# Patient Record
Sex: Male | Born: 1990 | Race: Black or African American | Hispanic: No
Health system: Southern US, Community
[De-identification: ages and names within clinical notes are randomized; demographics above are authoritative.]

## PROBLEM LIST (undated history)

## (undated) ENCOUNTER — Emergency Department (HOSPITAL_COMMUNITY): Admission: EM | Discharge status: Against Medical Advice | Payer: Medicaid Other

## (undated) DIAGNOSIS — D509 Iron deficiency anemia, unspecified: Secondary | ICD-10-CM

## (undated) DIAGNOSIS — K5090 Crohn's disease, unspecified, without complications: Secondary | ICD-10-CM

## (undated) DIAGNOSIS — J628 Pneumoconiosis due to other dust containing silica: Secondary | ICD-10-CM

## (undated) DIAGNOSIS — K922 Gastrointestinal hemorrhage, unspecified: Secondary | ICD-10-CM

## (undated) DIAGNOSIS — E43 Unspecified severe protein-calorie malnutrition: Secondary | ICD-10-CM

## (undated) DIAGNOSIS — K56609 Unspecified intestinal obstruction, unspecified as to partial versus complete obstruction: Secondary | ICD-10-CM

## (undated) DIAGNOSIS — K219 Gastro-esophageal reflux disease without esophagitis: Secondary | ICD-10-CM

## (undated) HISTORY — DX: Iron deficiency anemia, unspecified: D50.9

## (undated) HISTORY — DX: Gastro-esophageal reflux disease without esophagitis: K21.9

---
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
## 2010-02-26 ENCOUNTER — Emergency Department (HOSPITAL_COMMUNITY): Admission: EM | Admit: 2010-02-26 | Discharge: 2010-02-27 | Payer: Self-pay | Admitting: Emergency Medicine

## 2010-03-25 ENCOUNTER — Telehealth: Payer: Self-pay | Admitting: Gastroenterology

## 2010-05-06 ENCOUNTER — Ambulatory Visit: Admit: 2010-05-06 | Payer: Self-pay | Admitting: Gastroenterology

## 2010-05-28 NOTE — Progress Notes (Signed)
Summary: Records request from [facility]  Request for records received from [facility]. Request forwarded to [facility]. Wilder Glade  May 30, 2009 4:49 PM

## 2010-05-28 NOTE — Assessment & Plan Note (Signed)
Summary: 6 week follow up crohns/lk   History of Present Illness Visit Type: Follow-up Visit Primary GI MD: Elie Goody MD [facility] Primary Provider: NA Requesting Provider: NA Chief Complaint: Shane Klein here for follow up of his crohns. States that he was in the hospital for a small bowel obstruction on New Years. They started him on the prednisone dose he is on now and stopped his entocort.  History of Present Illness:   This is a return visit following hospitalization for a Crohn's flare with partial obstructive symptoms. He was placed on a prednisone taper and has had an excellent response. He has no abdominal complaints at this time and states he is gaining weight and feels well other than complaining of acne.   GI Review of Systems      Denies abdominal pain, acid reflux, belching, bloating, chest pain, dysphagia with liquids, dysphagia with solids, heartburn, loss of appetite, nausea, vomiting, vomiting blood, weight loss, and  weight gain.        Denies anal fissure, black tarry stools, change in bowel habit, constipation, diarrhea, diverticulosis, fecal incontinence, heme positive stool, hemorrhoids, irritable bowel syndrome, jaundice, light color stool, liver problems, rectal bleeding, and  rectal pain.   Current Medications (verified): 1)  Robinul-Forte 2 Mg Tabs (Glycopyrrolate) .... One Tablet By Mouth Two Times A Day 2)  Omeprazole 20 Mg Cpdr (Omeprazole) .... One Tablet By Mouth Once Daily 3)  Prednisone 10 Mg  Tabs (Prednisone) .... Take Two Tablets By Mouth in The Morning and One Tablet By Mouth in The Evening. 4)  Multivitamins  Tabs (Multiple Vitamin) .... Take One By Mouth Once Daily  Allergies (verified): No Known Drug Allergies  Past History:  Past Medical History: Crohn's ileoscoliitis Duodenitis Fe deficiency anemia GERD Tinea pedis Eczema Small bowel obstruction related to Crohn's  Past Surgical History: Reviewed history from 01/27/2009 and no changes  required. Unremarkable  Family History: Reviewed history from 01/27/2009 and no changes required. Family History of Diabetes: maternal aunt, maternal grandmother, maternal cousin No FH of Colon Cancer:  Social History: Single Alcohol Use - yes once a month Illicit Drug Use - yes THC 3-4 times  a week  Patient is a former smoker. -stopped April 2010 Patient gets regular exercise. Daily Caffeine Use varies  Review of Systems       The pertinent positives and negatives are noted as above and in the HPI. All other ROS were reviewed and were negative.   Vital Signs:  Patient profile:   20 year old male Height:      66 inches Weight:      174.8 pounds BMI:     28.32 Pulse rate:   64 / minute Pulse rhythm:   regular BP sitting:   114 / 60  (right arm) Cuff size:   regular  Vitals Entered By: Harlow Mares CMA [facility] Dull) (May 16, 2009 4:06 PM)  Physical Exam  General:  Well developed, well nourished, no acute distress. Head:  Normocephalic and atraumatic. Eyes:  PERRLA, no icterus. Mouth:  No deformity or lesions, dentition normal. Lungs:  Clear throughout to auscultation. Heart:  Regular rate and rhythm; no murmurs, rubs,  or bruits. Abdomen:  Soft, nontender and nondistended. No masses, hepatosplenomegaly or hernias noted. Normal bowel sounds. Psych:  Alert and cooperative. Normal mood and affect.  Impression & Recommendations:  Problem # 1:  CROHN'S DISEASE-LARGE & SMALL INTESTINE (ICD-555.2) His Crohn's disease has responded well to prednisone. Continue prednisone taper as outlined in patient  instructions. Resume Entocort when he reaches 10 mg of prednisone a day. Change Robinul 2 b.i.d. p.r.n.  Problem # 2:  ANEMIA-IRON DEFICIENCY (ICD-280.9) Begin iron replacement with meals.  Patient Instructions: 1)  Please take your prednisone as follows: Take 30 mg (2 tablets in the morning, 1 tablet in the evening) x 1 week. Then, take 20 mg (2 tablets in the morning) x 1  week. Then, take 10 mg (1 tablet every morning) x 1 week. Then, take 10 mg (1 tablet every morning) every OTHER day x 1 week. 2)  When you get down to 10 mg on your prednisone taper, please begin taking Entocort 3 tablets every morning. 3)  We have sent a new rx for Entocort to your pharmacy. 4)  Follow up as needed for your gastroentestinal concerns. 5)  The medication list was reviewed and reconciled.  All changed / newly prescribed medications were explained.  A complete medication list was provided to the patient / caregiver. 6)  Please schedule a follow-up appointment in 6 weeks.   Prescriptions: ENTOCORT EC 3 MG XR24H-CAP (BUDESONIDE) Take 3 tablets by mouth every morning.  #90 x 1   Entered by:   Hortense Ramal CMA (AAMA)   Authorized by:   Meryl Dare MD [facility]   Signed by:   Hortense Ramal CMA Duncan Dull) on 05/16/2009   Method used:   Electronically to        [facility] [phone]* (retail)       [address]       [place], [place]  [address]       Ph: [phone]       Fax: [phone]   RxID:   [id number]

## 2010-05-28 NOTE — Discharge Summary (Signed)
Summary: Discharge Summary -- Small Bowel Obstruction  NAME:  Shane Klein, Shane Klein              ACCOUNT NO.:  [id number]      MEDICAL RECORD NO.:  [id number]          PATIENT TYPE:  INP      LOCATION:  [place]                         FACILITY:  [facility]      PHYSICIAN:  Milus Banister, MD   DATE OF BIRTH:  1991/04/03      DATE OF ADMISSION:  04/24/2009   DATE OF DISCHARGE:  04/26/2009                                  DISCHARGE SUMMARY      ADMITTING DIAGNOSES:   1. Small bowel obstruction secondary to Crohn disease.   2. History of Crohn's ileocolitis involving the ileum and cecum       diagnosed in October 2010.   3. History of duodenitis.  Biopsy was inconclusive as to whether this       was Crohn's but could not rule out Crohn's related duodenitis.      DISCHARGE DIAGNOSES:   1. Small bowel obstruction secondary to Crohn disease.   2. Microcytic anemia.   3. Mild hyperglycemia associated with steroid use.      LABORATORY DATA:  Hemoglobin 11.1, hematocrit 34.5, MCV 70.6, platelet   count 508 and white blood cell count 10.3.  Erythrocyte sedimentation   rate 23.  C-reactive protein 3.8.  Sodium 136, potassium initially 5.3,   corrected to 4.5.  Chloride 102, CO2 28.  Glucose 105 and 103, BUN 11,   creatinine 0.7.  Calcium 9.3.      X-RAY STUDIES:  Acute abdominal series of December 30 showed small-bowel   obstruction consisting of dilated loops of proximal small bowel with air-   fluid levels.  There was a paucity of gas in the colon and rectosigmoid.      PROCEDURES:  None.      CONSULTATIONS:  None.      BRIEF HISTORY:  Shane Klein is a pleasant 20 year old African   American young man who was diagnosed with Crohn's colitis and terminal   ileitis in October 2010.  He had a colonoscopy at which time the Crohn's   involving the cecum was seen but Dr. Fuller Plan was unable to intubate the   terminal ileum.  On a CT scan obtained on December 14. there was   evidence for active Crohn's  involving distal terminal ileum of about 15   cm length.  On that CT scan there was question of a developing stricture   because there was some dilation in the more proximal small bowel.  Also   reading of that CT scan said cannot exclude small abscess within right   lower quadrant.  However, has not had fevers or malaise suggestive of   any active infection.      Medications recently had consisted of Entocort 9 mg daily.  Based on the   CT scan of the 14th Dr. Fuller Plan initiated prednisone taper because the   patient was having increased left lower quadrant pain.  This was a   tapering dose and when he was seen on the December 30, the dose was at  20 mg daily.  He had been feeling better.  However, on December 29 he   developed recurrent lower abdominal pain along with vomiting on the   morning of December 30.  Bowel movements had been fairly normal though   in the last 18 hours.  He had not had significant flatus.      The patient had an acute abdominal series which showed a small bowel   obstruction and he was admitted to the [facility] service with Dr.   Owens Loffler as the attending GI physician for this admission.  The   patient did not have any fever.  Fortunately this admission his current   a time when he is not attending school.      HOSPITAL COURSE:  The patient was admitted to the hospital and started   on Solu-Medrol 30 mg twice daily, IV Protonix and morphine and Zofran   for symptomatic relief.  He was supported with IV fluids.  He was   allowed ice chips only but diet was advanced through clear liquids and   ultimately to low-residue diet.      The patient had his first low-residue meal on December 31 in the   evening.  He tolerated this along with breakfast that morning.  Symptoms   were clearly improving and it seemed unnecessary to get a x-ray to   support what we knew was resolution of the acute small bowel   obstruction.  Note that his glucose was slightly elevated  with the   steroids in place.      The patient is to go home on a tapering dose of prednisone starting at   40 mg dosage daily.  He is to stop budesonide.      MEDICATIONS AT DISCHARGE:   1. Prednisone 20 mg twice daily for 10 days and then he decreases the       dose to 20 mg in the morning and 10 mg in the evening.  He is to       stay on that dose until he is seen by Dr. Fuller Plan.   2. Omeprazole 20 mg daily.   3. Robinul Forte (glycopyrrolate) 2 mg by mouth twice daily.   4. Multivitamin with iron once daily.      FOLLOW-UP:  Office visit with Dr. Fuller Plan is scheduled for January 21.   However we are going to work with a schedule and see if we can get him   squeezed in at a sooner date than that.  Otherwise he is to keep that   appointment.      CONDITION ON DISCHARGE:  Improved.      DISCHARGE DIET:  Should be a low fiber diet of low fiber/low residue   diet.  He is to receive a copy of a dietary outlined to help him with   choosing low residue foods.      ACTIVITY:  He has no restrictions.  He is okay to return to school on   January 5.               Azucena Freed, PA-C               Milus Banister, MD   Electronically Signed         SG/MEDQ  D:  04/26/2009  T:  04/26/2009  Job:  [id number]      cc:   Maudry Mayhew NP [facility]

## 2010-05-28 NOTE — Progress Notes (Signed)
Summary: Medication  Phone Note Call from Patient   Caller: Patient Call For: Dr. Russella Dar Reason for Call: Refill Medication Summary of Call: Needs his Robinul forte and Omeprazole refilled..sch'd appt on 05-06-09 Initial call taken by: Karna Christmas,  March 25, 2010 11:23 AM  Follow-up for Phone Call        Told pt that he was supposed to have a follow up appt with Dr. Russella Dar 6 weeks after his last office visit in January. Pt states he knows and he could not come to his appt. Pt did say he would come to the appointment in January and he is doing much better with Crohn's disease right now. Pt would just like a refill until his appt. Told pt that we would give him one refill until appt in January but keep appt for any further refills. Pt agreed.  Follow-up by: Christie Nottingham CMA Duncan Dull),  March 25, 2010 11:36 AM    Prescriptions: ROBINUL-FORTE 2 MG TABS (GLYCOPYRROLATE) one tablet by mouth two times a day  #60 x 0   Entered by:   Christie Nottingham CMA (AAMA)   Authorized by:   Meryl Dare MD [facility]   Signed by:   Christie Nottingham CMA Duncan Dull) on 03/25/2010   Method used:   Electronically to        [facility] [phone]* (retail)       [address]       [place], [place]  [address]       Ph: [phone]       Fax: [phone]   RxID:   [id number] OMEPRAZOLE 20 MG CPDR (OMEPRAZOLE) one tablet by mouth once daily  #30 x 0   Entered by:   Christie Nottingham CMA (AAMA)   Authorized by:   Meryl Dare MD [facility]   Signed by:   Christie Nottingham CMA Duncan Dull) on 03/25/2010   Method used:   Electronically to        [facility] [phone]* (retail)       [address]       [place], [place]  [address]       Ph: [phone]       Fax: [phone]   RxID:   [id number]

## 2010-07-07 LAB — COMPREHENSIVE METABOLIC PANEL
ALT: 11 U/L (ref 0–53)
AST: 17 U/L (ref 0–37)
Alkaline Phosphatase: 71 U/L (ref 39–117)
Calcium: 9.3 mg/dL (ref 8.4–10.5)
GFR calc Af Amer: >60 mL/min (ref >60)
Glucose, Bld: 86 mg/dL (ref 70–99)
Potassium: 3.8 mEq/L (ref 3.5–5.1)
Sodium: 137 mEq/L (ref 135–145)
Total Protein: 7.8 g/dL (ref 6.0–8.3)

## 2010-07-07 LAB — DIFFERENTIAL
Basophils Relative: 1 % (ref 0–1)
Eosinophils Absolute: 0.2 10*3/uL (ref 0.0–0.7)
Eosinophils Relative: 3 % (ref 0–5)
Lymphs Abs: 1 10*3/uL (ref 0.7–4.0)
Monocytes Absolute: 0.8 10*3/uL (ref 0.1–1.0)
Monocytes Relative: 11 % (ref 3–12)

## 2010-07-07 LAB — URINALYSIS, ROUTINE W REFLEX MICROSCOPIC
Bilirubin Urine: NEGATIVE
Glucose, UA: NEGATIVE mg/dL
Hgb urine dipstick: NEGATIVE
Ketones, ur: 15 mg/dL — AB
Protein, ur: NEGATIVE mg/dL
pH: 5 (ref 5.0–8.0)

## 2010-07-07 LAB — CBC
HCT: 35.6 % — ABNORMAL LOW (ref 39.0–52.0)
Hemoglobin: 11.6 g/dL — ABNORMAL LOW (ref 13.0–17.0)
MCHC: 32.6 g/dL (ref 30.0–36.0)
RDW: 19.1 % — ABNORMAL HIGH (ref 11.5–15.5)
WBC: 6.9 10*3/uL (ref 4.0–10.5)

## 2010-07-12 LAB — BASIC METABOLIC PANEL
BUN: 15 mg/dL (ref 6–23)
CO2: 28 mEq/L (ref 19–32)
Chloride: 97 mEq/L (ref 96–112)
Potassium: 4.5 mEq/L (ref 3.5–5.1)

## 2010-07-19 LAB — POCT HEMOGLOBIN-HEMACUE: Hemoglobin: 9.8 g/dL — ABNORMAL LOW (ref 13.0–17.0)

## 2010-07-27 LAB — CBC
HCT: 33.6 % — ABNORMAL LOW (ref 39.0–52.0)
HCT: 34.5 % — ABNORMAL LOW (ref 39.0–52.0)
Hemoglobin: 11.1 g/dL — ABNORMAL LOW (ref 13.0–17.0)
MCV: 70.6 fL — ABNORMAL LOW (ref 78.0–100.0)
MCV: 72.6 fL — ABNORMAL LOW (ref 78.0–100.0)
Platelets: 484 10*3/uL — ABNORMAL HIGH (ref 150–400)
RBC: 4.63 MIL/uL (ref 4.22–5.81)
RDW: 21.3 % — ABNORMAL HIGH (ref 11.5–15.5)
WBC: 8.4 10*3/uL (ref 4.0–10.5)

## 2010-07-27 LAB — POCT I-STAT, CHEM 8
Chloride: 102 mEq/L (ref 96–112)
Creatinine, Ser: 0.8 mg/dL (ref 0.4–1.5)
Glucose, Bld: 90 mg/dL (ref 70–99)
Potassium: 3.9 mEq/L (ref 3.5–5.1)

## 2010-07-27 LAB — BASIC METABOLIC PANEL
CO2: 28 mEq/L (ref 19–32)
Calcium: 9.3 mg/dL (ref 8.4–10.5)
Chloride: 102 mEq/L (ref 96–112)
GFR calc Af Amer: >60 mL/min (ref >60)
Potassium: 5.3 mEq/L — ABNORMAL HIGH (ref 3.5–5.1)
Sodium: 136 mEq/L (ref 135–145)

## 2010-07-27 LAB — DIFFERENTIAL
Basophils Absolute: 0 10*3/uL (ref 0.0–0.1)
Eosinophils Relative: 0 % (ref 0–5)
Lymphs Abs: 0.5 10*3/uL — ABNORMAL LOW (ref 0.7–4.0)
Monocytes Absolute: 1.1 10*3/uL — ABNORMAL HIGH (ref 0.1–1.0)
Monocytes Relative: 11 % (ref 3–12)
Neutro Abs: 8.7 10*3/uL — ABNORMAL HIGH (ref 1.7–7.7)

## 2010-07-27 LAB — URINALYSIS, ROUTINE W REFLEX MICROSCOPIC
Bilirubin Urine: NEGATIVE
Hgb urine dipstick: NEGATIVE
Specific Gravity, Urine: 1.01 (ref 1.005–1.030)
pH: 7 (ref 5.0–8.0)

## 2010-08-10 LAB — CBC
Hemoglobin: 10.9 g/dL — ABNORMAL LOW (ref 12.0–16.0)
MCV: 70.3 fL — ABNORMAL LOW (ref 78.0–98.0)
RBC: 4.82 MIL/uL (ref 3.80–5.70)
WBC: 6.5 10*3/uL (ref 4.5–13.5)

## 2010-08-10 LAB — RHEUMATOID FACTOR: Rhuematoid fact SerPl-aCnc: <20 IU/mL (ref 0–20)

## 2010-08-10 LAB — COMPREHENSIVE METABOLIC PANEL
ALT: 10 U/L (ref 0–53)
AST: 13 U/L (ref 0–37)
Alkaline Phosphatase: 56 U/L (ref 52–171)
CO2: 27 mEq/L (ref 19–32)
Chloride: 108 mEq/L (ref 96–112)
Glucose, Bld: 119 mg/dL — ABNORMAL HIGH (ref 70–99)
Sodium: 142 mEq/L (ref 135–145)
Total Bilirubin: 0.4 mg/dL (ref 0.3–1.2)

## 2010-08-10 LAB — DIFFERENTIAL
Basophils Absolute: 0 10*3/uL (ref 0.0–0.1)
Basophils Relative: 1 % (ref 0–1)
Eosinophils Absolute: 0.5 10*3/uL (ref 0.0–1.2)
Eosinophils Relative: 8 % — ABNORMAL HIGH (ref 0–5)
Neutrophils Relative %: 70 % (ref 43–71)

## 2010-08-10 LAB — URINALYSIS, ROUTINE W REFLEX MICROSCOPIC
Bilirubin Urine: NEGATIVE
Glucose, UA: NEGATIVE mg/dL
Ketones, ur: NEGATIVE mg/dL
Nitrite: NEGATIVE
Specific Gravity, Urine: 1.017 (ref 1.005–1.030)
pH: 7.5 (ref 5.0–8.0)

## 2010-08-10 LAB — LUPUS ANTICOAGULANT PANEL: PTT Lupus Anticoagulant: 60.9 secs — ABNORMAL HIGH (ref 36.3–48.8)

## 2010-10-15 ENCOUNTER — Emergency Department (HOSPITAL_COMMUNITY): Payer: Medicaid Other

## 2010-10-15 ENCOUNTER — Inpatient Hospital Stay (HOSPITAL_COMMUNITY)
Admission: EM | Admit: 2010-10-15 | Discharge: 2010-10-17 | DRG: 386 | Disposition: A | Discharge status: Home / Self Care | Payer: Medicaid Other | Attending: Internal Medicine | Admitting: Internal Medicine

## 2010-10-15 DIAGNOSIS — K625 Hemorrhage of anus and rectum: Secondary | ICD-10-CM | POA: Diagnosis present

## 2010-10-15 DIAGNOSIS — K56609 Unspecified intestinal obstruction, unspecified as to partial versus complete obstruction: Secondary | ICD-10-CM | POA: Diagnosis present

## 2010-10-15 DIAGNOSIS — K5010 Crohn's disease of large intestine without complications: Principal | ICD-10-CM | POA: Diagnosis present

## 2010-10-15 DIAGNOSIS — D500 Iron deficiency anemia secondary to blood loss (chronic): Secondary | ICD-10-CM | POA: Diagnosis present

## 2010-10-15 DIAGNOSIS — K219 Gastro-esophageal reflux disease without esophagitis: Secondary | ICD-10-CM | POA: Diagnosis present

## 2010-10-15 LAB — COMPREHENSIVE METABOLIC PANEL
ALT: 10 U/L (ref 0–53)
AST: 14 U/L (ref 0–37)
CO2: 25 mEq/L (ref 19–32)
Chloride: 103 mEq/L (ref 96–112)
Creatinine, Ser: 0.79 mg/dL (ref 0.50–1.35)
GFR calc non Af Amer: >60 mL/min (ref >60)
Glucose, Bld: 98 mg/dL (ref 70–99)
Total Bilirubin: 0.3 mg/dL (ref 0.3–1.2)

## 2010-10-15 LAB — CBC
Hemoglobin: 12.5 g/dL — ABNORMAL LOW (ref 13.0–17.0)
MCV: 77.3 fL — ABNORMAL LOW (ref 78.0–100.0)
Platelets: 340 10*3/uL (ref 150–400)
RBC: 4.93 MIL/uL (ref 4.22–5.81)
WBC: 8.9 10*3/uL (ref 4.0–10.5)

## 2010-10-15 LAB — URINALYSIS, ROUTINE W REFLEX MICROSCOPIC
Bilirubin Urine: NEGATIVE
Hgb urine dipstick: NEGATIVE
Protein, ur: NEGATIVE mg/dL
Urobilinogen, UA: 0.2 mg/dL (ref 0.0–1.0)

## 2010-10-15 LAB — DIFFERENTIAL
Eosinophils Absolute: 0.3 10*3/uL (ref 0.0–0.7)
Lymphocytes Relative: 18 % (ref 12–46)
Lymphs Abs: 1.6 10*3/uL (ref 0.7–4.0)
Neutro Abs: 6 10*3/uL (ref 1.7–7.7)
Neutrophils Relative %: 68 % (ref 43–77)

## 2010-10-15 LAB — PHOSPHORUS: Phosphorus: 3 mg/dL (ref 2.3–4.6)

## 2010-10-15 MED ORDER — IOHEXOL 300 MG/ML  SOLN
100.0000 mL | Freq: Once | INTRAMUSCULAR | Status: AC | PRN
  Administered 2010-10-15: 100 mL via INTRAVENOUS

## 2010-10-15 MED ORDER — IOHEXOL 300 MG/ML  SOLN: 100.0000 mL | Freq: Once | INTRAMUSCULAR | Status: AC | PRN

## 2010-10-16 DIAGNOSIS — K56609 Unspecified intestinal obstruction, unspecified as to partial versus complete obstruction: Secondary | ICD-10-CM

## 2010-10-16 DIAGNOSIS — R1084 Generalized abdominal pain: Secondary | ICD-10-CM

## 2010-10-16 DIAGNOSIS — K5080 Crohn's disease of both small and large intestine without complications: Secondary | ICD-10-CM

## 2010-10-16 LAB — FOLATE: Folate: 11.4 ng/mL

## 2010-10-16 LAB — BASIC METABOLIC PANEL
CO2: 24 mEq/L (ref 19–32)
Calcium: 9.6 mg/dL (ref 8.4–10.5)
Creatinine, Ser: 0.72 mg/dL (ref 0.50–1.35)

## 2010-10-16 LAB — IRON AND TIBC: Iron: 19 ug/dL — ABNORMAL LOW (ref 42–135)

## 2010-10-16 LAB — CBC
HCT: 37.8 % — ABNORMAL LOW (ref 39.0–52.0)
MCHC: 32.8 g/dL (ref 30.0–36.0)
MCV: 77 fL — ABNORMAL LOW (ref 78.0–100.0)
RDW: 16.2 % — ABNORMAL HIGH (ref 11.5–15.5)

## 2010-10-16 LAB — VITAMIN B12: Vitamin B-12: 496 pg/mL (ref 211–911)

## 2010-10-16 LAB — FERRITIN: Ferritin: 34 ng/mL (ref 22–322)

## 2010-10-17 LAB — CBC
MCV: 76.7 fL — ABNORMAL LOW (ref 78.0–100.0)
Platelets: 371 10*3/uL (ref 150–400)
RDW: 16.2 % — ABNORMAL HIGH (ref 11.5–15.5)
WBC: 10 10*3/uL (ref 4.0–10.5)

## 2010-10-17 LAB — COMPREHENSIVE METABOLIC PANEL
AST: 10 U/L (ref 0–37)
Albumin: 3.3 g/dL — ABNORMAL LOW (ref 3.5–5.2)
Calcium: 9.6 mg/dL (ref 8.4–10.5)
Chloride: 102 mEq/L (ref 96–112)
Creatinine, Ser: 0.86 mg/dL (ref 0.50–1.35)
Total Bilirubin: 0.2 mg/dL — ABNORMAL LOW (ref 0.3–1.2)
Total Protein: 6.9 g/dL (ref 6.0–8.3)

## 2010-10-20 NOTE — Discharge Summary (Signed)
NAME:  Shane Klein, Shane Klein NO.:  [id number]  MEDICAL RECORD NO.:  [id number]  LOCATION:  1343                         FACILITY:  [facility]  PHYSICIAN:  Hillery Aldo, M.D.   DATE OF BIRTH:  04-Feb-1991  DATE OF ADMISSION:  10/15/2010 DATE OF DISCHARGE:  10/17/2010                              DISCHARGE SUMMARY   PRIMARY CARE PHYSICIAN:  [facility].  GASTROENTEROLOGIST:  Venita Lick. Russella Dar, MD, [facility]  DISCHARGE DIAGNOSES: 1. Crohn's colitis. 2. Transient partial small-bowel obstruction. 3. Gastroesophageal reflux disease. 4. Rectal bleeding secondary to Crohn colitis, self limited. 5. Iron-deficiency anemia secondary to chronic gastrointestinal     losses.  DISCHARGE MEDICATIONS: 1. Ferrous sulfate 325 mg p.o. daily with meals. 2. Prednisone 40 mg x2 weeks then 30 mg x1 week, then 20 mg x1 week,     then 10 mg x1 week, then stop. 3. Multivitamin with iron one tablet p.o. daily. 4. Extra strength Tylenol 1500 mg p.o. q.4 h p.r.n. pain.  CONSULTATIONS:  Dr. Russella Dar of Gastroenterology.  BRIEF ADMISSION HPI:  The patient is a 20 year old male with past medical history of Crohn disease with multiple flares in the past, not currently on maintenance medications, who came to the hospital complaining of abdominal pain, nausea, diminished appetite, fever, chills, loose stools and blood in the stools.  He was felt to have an active flare of his Crohn disease and subsequently was referred to the hospitalist service for further evaluation and treatment.  For full details, please see the dictated report done by Dr. Gwenlyn Perking.  PROCEDURES AND DIAGNOSTIC STUDIES: 1. Acute abdominal series on October 15, 2010, showed prominent small     bowel loop in the lower abdomen.  Small bowel obstruction could not     be excluded. 2. CT scan of the abdomen and pelvis on October 15, 2010, showed     inflammatory process in the right lower quadrant with terminal     ileal thickening  compatible with active Crohn disease.  Mild     surrounding stranding.  Enlarged mesenteric lymph nodes and     retroperitoneal lymph nodes.  Evidence of small bowel obstruction     with transition in the terminal ileum.  Proximal to this, the small     bowel was dilated and filled with feculent material.  DISCHARGE LABORATORY VALUES:  Sodium is 136, potassium 3.7, chloride 102, bicarb 27, BUN 13, creatinine 0.86, glucose 100, calcium 9.6, total bilirubin 0.2, alkaline phosphatase 59, AST 10, ALT 7, total protein 6.9, albumin 3.3.  White blood cell count was 10, hemoglobin 11.7, hematocrit 35.2, platelets 371.  HOSPITAL COURSE BY PROBLEM: 1. Crohn's colitis:  The patient was admitted and put on IV Solu-     Medrol, empiric antibiotics, and bowel rest.  Gastroenterology     consultation was requested and kindly provided by Dr. Russella Dar who     ultimately discontinued antibiotics and transitioned the patient     over to prednisone with improvement of his GI symptoms.  The     patient's diet was slowly advanced and at the time of discharge, he     is tolerating a low residue diet without difficulty.  He states     that his bloody stools have stopped and is anxious to be discharged     home.  The patient will be discharged home on a steroid taper as     recommended by Dr. Russella Dar.  He is encouraged to follow up with Dr.     Russella Dar and agrees to do so. 2. Partial small-bowel obstruction:  Clinically resolved.  The patient     is tolerating diet and is having bowel movements. 3. Gastroesophageal reflux disease:  The patient was maintained on     proton pump inhibitor therapy treatment while in the hospital. 4. Rectal bleeding:  Self limited.  The patient's hemoglobin and     hematocrit are stable. 5. Iron-deficiency anemia:  Secondary to chronic GI losses.  The     patient is put on iron supplementation therapy.  DISPOSITION:  The patient is medically stable and will be  discharged home.  DISCHARGE ACTIVITY:  No restrictions.  DISCHARGE DIET:  Low residue/fiber  FOLLOWUP:  With Dr. Claudette Head in 3 to 4 weeks.  Follow up with [facility] as needed.  CONDITION ON DISCHARGE:  Improved.  Time spent coordinating care for discharge, discharge instruction including face-to-face time equals approximately 25 minutes.     Hillery Aldo, M.D.     CR/MEDQ  D:  10/17/2010  T:  10/17/2010  Job:  [id number]  cc:   [facility]  Venita Lick. Russella Dar, MD, FACG [address] [place] [address]  Electronically Signed by Hillery Aldo M.D. on 10/20/2010 06:03:45 PM

## 2010-10-21 ENCOUNTER — Telehealth: Payer: Self-pay | Admitting: Gastroenterology

## 2010-10-21 NOTE — Telephone Encounter (Signed)
Dismissal Letter sent by Certified Mail to address [place] 10/21/2010  Dismissal Letter sent 2nd time by Certified Mail to address [address] [place]ta Koc[place]  11/12/2010  Dismissal Letter returned unclaimed by the patient and resent by [employer] Mail 12/08/2010

## 2010-11-13 NOTE — H&P (Signed)
NAME:  Shane Klein, Shane Klein NO.:  [id number]  MEDICAL RECORD NO.:  [id number]  LOCATION:  [facility]                         FACILITY:  [facility]  PHYSICIAN:  Julieta Bellini, MDDATE OF BIRTH:  10/04/1990  DATE OF ADMISSION:  10/15/2010 DATE OF DISCHARGE:                             HISTORY & PHYSICAL   Shane Klein is currently following with Dr. Lucio Edward, [facility].  CHIEF COMPLAINT:  Diffuse abdominal pain, bright red blood per rectum, loose stool.  HISTORY OF PRESENT ILLNESS:  Shane Klein is a 20 year old male with a past medical history significant for Crohn's disease, multiple flares in the past, currently, not on maintenance treatment, who came into the hospital complaining of abdominal pain, nausea, decreased appetite, some fever and chills and loose stool, last one associated with a small amount of bright red blood per rectum x1 episode.  Symptoms have been present for the last 24-36 hours and have progressively worsened, reason why he came to the Emergency Department for further evaluation and treatment.  While in the Emergency Department,Shane Klein had acute abdominal series that demonstrated prominent small bowel loops in the lower abdomen and a CT abdomen and the pelvis without contrast that demonstrated inflammatory process in the right lower quadrant with a terminal ileal thickening compatible with active Crohn disease.  There is mild surrounding stranding.  Enlarged mesenteric lymph nodes and retroperitoneal lymph nodes.  There is also evidence of small-bowel obstruction with transition at the terminal ileum, main reason why [facility] was called to provide further evaluation and treatment of this Shane Klein.  ALLERGIES:  There is no known drug allergy.  PAST MEDICAL HISTORY:  Significant just for: 1. Crohn disease. 2. Prior history of small-bowel obstruction secondary to his Crohn's. 3. Also, gastroesophageal reflux disease.  CURRENT MEDICATIONS:   He is supposed to be taking: 1. Multivitamins with iron. 2. Tylenol Extra Strength. 3. Per Shane Klein over the counter, he had been over the last couple of     days due to the abdominal pain using some ibuprofen as well.  SOCIAL HISTORY:  He reports occasional alcohol ingestion.  Denies smoking and also illicit drugs.  He currently lives with his parents here in [place].  He finished high school, unemployed and has not decided what to do regarding college studies.  FAMILY HISTORY:  Father and mother noncontributory.  He has one aunt on her mother's side who also have Crohn disease recently diagnosed.  REVIEW OF SYSTEMS:  Positive for fever and chills, otherwise negative except as mentioned on HPI.  PHYSICAL EXAMINATION:  VITAL SIGNS:  Temperature 98.3, heart rate 64, respiratory rate 16, blood pressure 126/73, oxygen saturation 96% on room air. GENERAL:  Shane Klein was lying in bed, in no acute distress right now. Appears to be well-developed, well-nourished. HEENT:  Normocephalic without any trauma.  Eyes: Extraocular muscles intact.  PERRLA.  No icterus.  No nystagmus.  Ears, nose and throat: Appears to be normal.  There is mild mucous membrane dryness, but no exudates or erythema.  No oral lesions.  Good dentition. NECK:  Supple.  Full range of motion.  No lymphadenopathy.  No thyromegaly.  No bruits. RESPIRATORY SYSTEM:  Clear to auscultation bilaterally. CARDIOVASCULAR SYSTEM:  Regular rate and rhythm.  No murmurs, gallops or rubs. ABDOMEN:  Soft, mild distention.  Diffuse tenderness with a squeezing discomfort in the right lower quadrant.  There is positive bowel sounds, but decreased.  There was no guarding. EXTREMITIES:  Full range of motion.  Normal pulses.  No edema, cyanosis or clubbing. NEUROLOGIC EXAMINATION:  Alert, awake and oriented x3.  Cranial nerves II through XII intact.  Motor strength 5/5 bilaterally symmetrically. Sensation was normal. SKIN:  No rashes.  No  petechiae. PSYCHIATRIC EXAMINATION:  Appropriate.  LABORATORY DATA:  Laboratory data demonstrated CBC with differential showing white blood cells of 8.9, hemoglobin 12.5 with an MCV of 7.3, platelets 340,000.  Comprehensive metabolic panel with a sodium of 136, potassium 4.1, chloride 103, bicarb 25, glucose 98, BUN 9, creatinine 0.79 and completely normal LFTs.  Urinalysis was completely normal.  PT 15.4, INR 1.19, PTT 32.  IMAGING STUDIES:  As mentioned on HPI, x-ray demonstrated a small bowel loop in the lower abdomen quadrant with a CT of abdomen and pelvis that demonstrated inflammatory process in the right lower quadrant terminal ileum consistent with a Crohn disease and some evidence of small-bowel instruction.  ASSESSMENT AND PLAN: 1. Shane Klein's abdominal pain, nausea, bright blood per rectum and     decreased appetite:  Most likely secondary to Crohn's flare and     small-bowel obstruction.  At this moment, we are going to admit the     Shane Klein and provide fluid resuscitation.  We are going to place him     on small amount of clear liquids and ice chips to provide some     bowel rest.  We are going to start him on Solu-Medrol intravenous     and also ciprofloxacin and Flagyl.  We are going to use Protonix     intravenous for gastric protection and we are going to call     gastrointestinal in order to receive further recommendations     regarding maintenance for his Crohn's. 2. Bright red blood per rectum:  He is currently not having any     further bleeding.  This was a one-time episode while he was at home     most likely secondary to his Crohn disease.  At this moment due to     the acute flare, he does not qualify for a colonoscopy, which might     actually in fact ended into perforation.  We are going to provide     fluid resuscitation and we are going to check his hemoglobin     periodically.  He is currently hemodynamically stable. 3. Shane Klein's small-bowel obstruction:   Bowel rest pain.  Pain control.     Gentle fluid resuscitation.  We are going to follow his     electrolytes and replete them accordingly.  We are going to treat     his Crohn's. 4. Gastroesophageal reflux disease:  We are going to use Protonix. 5. Deep venous thrombosis prophylaxis:  We are going to place the     Shane Klein on sequential compression devices due to the history of     bright red blood per rectum. 6. Anemia with low a MCV, most likely secondary to his inflammatory     bowel disease being oozing some blood:  He will potentially require     to be placed on iron.  We are going to check an anemia panel in     order to follow on     these and  we are going to depending on the results start him on     iron supplementation. 7. Further treatment and evaluation are going to depend on the     Shane Klein's evolution. 8. Shane Klein is going to be followed by [facility].     Julieta Bellini, MD     CEM/MEDQ  D:  10/15/2010  T:  10/15/2010  Job:  [id number]  cc:   Pricilla Riffle. Fuller Plan, MD, FACG [address] [address]  Electronically Signed by Barton Dubois MD on 11/13/2010 04:20:46 PM

## 2010-12-25 ENCOUNTER — Encounter: Payer: Self-pay | Admitting: Gastroenterology

## 2011-01-04 ENCOUNTER — Ambulatory Visit: Payer: Medicaid Other | Admitting: Gastroenterology

## 2011-02-01 LAB — URINALYSIS, ROUTINE W REFLEX MICROSCOPIC
Glucose, UA: NEGATIVE
Hgb urine dipstick: NEGATIVE
Ketones, ur: NEGATIVE
Protein, ur: NEGATIVE
pH: 5

## 2011-02-01 LAB — I-STAT 8, (EC8 V) (CONVERTED LAB)
BUN: 9
Glucose, Bld: 94
HCT: 42
Hemoglobin: 14.3
Operator id: 284251
Potassium: 3.9
Sodium: 136
TCO2: 26

## 2011-02-01 LAB — CBC
HCT: 35.8 — ABNORMAL LOW
Hemoglobin: 11.6 — ABNORMAL LOW
MCHC: 32.6
MCV: 70.3 — ABNORMAL LOW
RBC: 5.09

## 2011-02-01 LAB — DIFFERENTIAL
Basophils Relative: 1
Eosinophils Absolute: 0.9
Eosinophils Relative: 11 — ABNORMAL HIGH
Monocytes Absolute: 1.3 — ABNORMAL HIGH
Monocytes Relative: 15 — ABNORMAL HIGH

## 2011-02-01 LAB — POCT I-STAT CREATININE: Operator id: 284251

## 2011-04-09 ENCOUNTER — Encounter: Payer: Self-pay | Admitting: Emergency Medicine

## 2011-04-09 ENCOUNTER — Emergency Department (HOSPITAL_COMMUNITY)
Admission: EM | Admit: 2011-04-09 | Discharge: 2011-04-10 | Disposition: A | Discharge status: Home / Self Care | Payer: Medicaid Other | Attending: Emergency Medicine | Admitting: Emergency Medicine

## 2011-04-09 HISTORY — DX: Crohn's disease, unspecified, without complications: K50.90

## 2011-04-09 NOTE — ED Notes (Signed)
Pt brought in by [facility] with c/o abd pain and back pain  Pt has hx of crohns disease  Pt states the pain started 2 days ago  Denies N/V/D

## 2011-04-27 DIAGNOSIS — K56609 Unspecified intestinal obstruction, unspecified as to partial versus complete obstruction: Secondary | ICD-10-CM

## 2011-04-27 HISTORY — DX: Unspecified intestinal obstruction, unspecified as to partial versus complete obstruction: K56.609

## 2011-09-04 ENCOUNTER — Encounter (HOSPITAL_COMMUNITY): Payer: Self-pay

## 2011-09-04 ENCOUNTER — Emergency Department (HOSPITAL_COMMUNITY)
Admission: EM | Admit: 2011-09-04 | Discharge: 2011-09-04 | Disposition: A | Discharge status: Home / Self Care | Payer: Self-pay | Attending: Emergency Medicine | Admitting: Emergency Medicine

## 2011-09-04 DIAGNOSIS — R110 Nausea: Secondary | ICD-10-CM | POA: Insufficient documentation

## 2011-09-04 DIAGNOSIS — K5090 Crohn's disease, unspecified, without complications: Secondary | ICD-10-CM | POA: Insufficient documentation

## 2011-09-04 DIAGNOSIS — R109 Unspecified abdominal pain: Secondary | ICD-10-CM | POA: Insufficient documentation

## 2011-09-04 DIAGNOSIS — M549 Dorsalgia, unspecified: Secondary | ICD-10-CM | POA: Insufficient documentation

## 2011-09-04 LAB — URINALYSIS, ROUTINE W REFLEX MICROSCOPIC
Glucose, UA: NEGATIVE mg/dL
Hgb urine dipstick: NEGATIVE
Ketones, ur: 40 mg/dL — AB
Leukocytes, UA: NEGATIVE
Nitrite: NEGATIVE
Protein, ur: 30 mg/dL — AB
Specific Gravity, Urine: 1.028 (ref 1.005–1.030)
Urobilinogen, UA: 0.2 mg/dL (ref 0.0–1.0)
pH: 6 (ref 5.0–8.0)

## 2011-09-04 LAB — URINE MICROSCOPIC-ADD ON

## 2011-09-04 MED ORDER — OXYCODONE-ACETAMINOPHEN 5-325 MG PO TABS
1.0000 | ORAL_TABLET | Freq: Once | ORAL | Status: AC
  Administered 2011-09-04: 1 via ORAL
  Filled 2011-09-04: qty 1

## 2011-09-04 MED ORDER — CEPHALEXIN 500 MG PO CAPS: 500.0000 mg | ORAL_CAPSULE | Freq: Four times a day (QID) | ORAL | Status: AC

## 2011-09-04 MED ORDER — OXYCODONE-ACETAMINOPHEN 5-325 MG PO TABS: 1.0000 | ORAL_TABLET | ORAL | Status: AC | PRN

## 2011-09-04 MED ORDER — IBUPROFEN 200 MG PO TABS
400.0000 mg | ORAL_TABLET | Freq: Once | ORAL | Status: AC
  Administered 2011-09-04: 400 mg via ORAL
  Filled 2011-09-04: qty 2

## 2011-09-04 NOTE — ED Notes (Signed)
Pt states a hx of crones disease

## 2011-09-04 NOTE — Discharge Instructions (Signed)
Back Pain, Adult Back pain is very common. The pain often gets better over time. The cause of back pain is usually not dangerous. Most people can learn to manage their back pain on their own.  HOME CARE   Stay active. Start with short walks on flat ground if you can. Try to walk farther each day.   Do not sit, drive, or stand in one place for more than 30 minutes. Do not stay in bed.   Do not avoid exercise or work. Activity can help your back heal faster.   Be careful when you bend or lift an object. Bend at your knees, keep the object close to you, and do not twist.   Sleep on a firm mattress. Lie on your side, and bend your knees. If you lie on your back, put a pillow under your knees.   Only take medicines as told by your doctor.   Put ice on the injured area.   Put ice in a plastic bag.   Place a towel between your skin and the bag.   Leave the ice on for 15 to 20 minutes, 3 to 4 times a day for the first 2 to 3 days. After that, you can switch between ice and heat packs.   Ask your doctor about back exercises or massage.   Avoid feeling anxious or stressed. Find good ways to deal with stress, such as exercise.  GET HELP RIGHT AWAY IF:   Your pain does not go away with rest or medicine.   Your pain does not go away in 1 week.   You have new problems.   You do not feel well.   The pain spreads into your legs.   You cannot control when you poop (bowel movement) or pee (urinate).   Your arms or legs feel weak or lose feeling (numbness).   You feel sick to your stomach (nauseous) or throw up (vomit).   You have belly (abdominal) pain.   You feel like you may pass out (faint).  MAKE SURE YOU:   Understand these instructions.   Will watch your condition.   Will get help right away if you are not doing well or get worse.  Document Released: 09/29/2007 Document Revised: 04/01/2011 Document Reviewed: 08/31/2010 [facility] Patient Information 2012 [place].  RESOURCE GUIDE  Dental Problems  Patients with Medicaid: [place] Phone:  [phone]                                                  Phone:  [phone]  If unable to pay or uninsured, contact:  [facility] or [facility]. to become qualified for the adult dental clinic.  Chronic Pain Problems Contact [facility]  [phone] Patients need to be referred by their primary care doctor.  Insufficient Money for Medicine Contact [employer]:  call "211" or [place] [phone].  No Primary Care Doctor Call [facility]  [phone] Other agencies that provide inexpensive medical care    [place]  [phone]    [facility] Internal Medicine  [place]  [phone]    [facility]  [phone]    [facility]  [place]  [phone] [place]   [phone] (emergency services [phone])  [facility]  [phone] [facility] [phone] [facility] [place] [phone] [facility] [phone] Residential & Outpatient Substance Abuse Program  [phone]  Abuse/Neglect [place] [phone] [place] [phone] (After Hours)  Emergency Welcome [phone]  [place] at the [place] [phone] [place] [phone]  MRSA Hotline #:   (706)660-4216    [name] Dept. [address]      [place] Phone:  [phone]                                   Phone:  [phone]                 Phone:  [place] Phone:  [place] [phone] (After Hours)

## 2011-09-04 NOTE — ED Notes (Signed)
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  He is currently not nauseated and has vomited only about 3 times in the last week.  Diarrhea is constant.  Also c/o back pain which he rates as a 10.  Pain is non-radiating, but patient says it is hard for him to sleep.

## 2011-09-04 NOTE — ED Notes (Signed)
Pt in by ems with back pain and abd pain x1 week no recent injury

## 2011-09-04 NOTE — ED Notes (Signed)
Pt in from home via ems with back pain, abd pain, n,v,d states ongoing for 2 weeks denies recent injury to the back

## 2011-09-08 NOTE — ED Provider Notes (Signed)
History    20yM with back pain. Gradual onset about a week ago. Denies trauma. Mid to lower back. Worse with movement. No numbness, tingling or loss of strength. Patient apparently mentioned abdominal pain the triage note, but he denies this to me. Does have a history of Crohn's though. No history of surgery for spurs. No urinary complaints. No nausea, vomiting or diarrhea. No fevers or chills. No bladder or bowel incontinence. Denies history of back surgery. denies use of blood thinning medication. Denies history of IV drug use CSN: [id number]  Arrival date & time 09/04/11  1219   First MD Initiated Contact with Patient 09/04/11 1247      Chief Complaint  Patient presents with  . Abdominal Pain  . Nausea  . Back Pain    (Consider location/radiation/quality/duration/timing/severity/associated sxs/prior treatment) HPI  Past Medical History  Diagnosis Date  . Crohn's disease     History reviewed. No pertinent past surgical history.  No family history on file.  History  Substance Use Topics  . Smoking status: Never Smoker   . Smokeless tobacco: Not on file  . Alcohol Use: No      Review of Systems   Review of symptoms negative unless otherwise noted in HPI.   Allergies  Review of patient's allergies indicates no known allergies.  Home Medications   Current Outpatient Rx  Name Route Sig Dispense Refill  . ACETAMINOPHEN 500 MG PO TABS Oral Take 1,000 mg by mouth every 6 (six) hours as needed. pain     . CEPHALEXIN 500 MG PO CAPS Oral Take 1 capsule (500 mg total) by mouth 4 (four) times daily. 20 capsule 0  . OXYCODONE-ACETAMINOPHEN 5-325 MG PO TABS Oral Take 1-2 tablets by mouth every 4 (four) hours as needed for pain. 15 tablet 0    BP 129/74  Pulse 81  Temp(Src) 98.3 F (36.8 C) (Oral)  Resp 20  SpO2 99%  Physical Exam  Nursing note and vitals reviewed. Constitutional: He appears well-developed and well-nourished. No distress.  HENT:  Head: Normocephalic  and atraumatic.  Eyes: Conjunctivae are normal. Right eye exhibits no discharge. Left eye exhibits no discharge.  Neck: Neck supple.  Cardiovascular: Normal rate, regular rhythm and normal heart sounds.  Exam reveals no gallop and no friction rub.   No murmur heard. Pulmonary/Chest: Effort normal and breath sounds normal. No respiratory distress.  Abdominal: Soft. He exhibits no distension. There is no tenderness.  Genitourinary:       No cva tenderness.  Musculoskeletal: He exhibits no edema and no tenderness.  Neurological: He is alert.  Skin: Skin is warm and dry.  Psychiatric: He has a normal mood and affect. His behavior is normal. Thought content normal.    ED Course  Procedures (including critical care time)  Labs Reviewed  URINALYSIS, ROUTINE W REFLEX MICROSCOPIC - Abnormal; Notable for the following:    Color, Urine AMBER (*) BIOCHEMICALS MAY BE AFFECTED BY COLOR   APPearance CLOUDY (*)    Bilirubin Urine MODERATE (*)    Ketones, ur 40 (*)    Protein, ur 30 (*)    All other components within normal limits  URINE MICROSCOPIC-ADD ON - Abnormal; Notable for the following:    Bacteria, UA MANY (*)    All other components within normal limits  LAB REPORT - SCANNED   No results found.   1. Back pain       MDM  20yM with atraumatic lower back pain. Suspect muscular strain. Pt  denies abdominal pain and bening abodminal exam. Hx of crohn's but doubt exacerbation. UA with many bacteria but no mention of squam cells. Although no urinary symptoms, given back pain will tx although very low suspicion as etiology of his pain. Return precautions discussed. Do not feel further testing indicated at this time.        Virgel Manifold, MD 09/08/11 1739

## 2011-09-09 ENCOUNTER — Emergency Department (HOSPITAL_COMMUNITY): Payer: Self-pay

## 2011-09-09 ENCOUNTER — Emergency Department (HOSPITAL_COMMUNITY)
Admission: EM | Admit: 2011-09-09 | Discharge: 2011-09-09 | Disposition: A | Discharge status: Home / Self Care | Payer: Self-pay | Attending: Emergency Medicine | Admitting: Emergency Medicine

## 2011-09-09 ENCOUNTER — Encounter (HOSPITAL_COMMUNITY): Payer: Self-pay

## 2011-09-09 DIAGNOSIS — M549 Dorsalgia, unspecified: Secondary | ICD-10-CM | POA: Insufficient documentation

## 2011-09-09 DIAGNOSIS — K5090 Crohn's disease, unspecified, without complications: Secondary | ICD-10-CM | POA: Insufficient documentation

## 2011-09-09 LAB — CBC
HCT: 36.1 % — ABNORMAL LOW (ref 39.0–52.0)
Hemoglobin: 12.3 g/dL — ABNORMAL LOW (ref 13.0–17.0)
MCH: 26 pg (ref 26.0–34.0)
MCHC: 34.1 g/dL (ref 30.0–36.0)
MCV: 76.3 fL — ABNORMAL LOW (ref 78.0–100.0)
Platelets: 494 10*3/uL — ABNORMAL HIGH (ref 150–400)
RBC: 4.73 MIL/uL (ref 4.22–5.81)
RDW: 14.3 % (ref 11.5–15.5)
WBC: 7.1 10*3/uL (ref 4.0–10.5)

## 2011-09-09 LAB — COMPREHENSIVE METABOLIC PANEL
ALT: 7 U/L (ref 0–53)
AST: 13 U/L (ref 0–37)
Albumin: 3.5 g/dL (ref 3.5–5.2)
Alkaline Phosphatase: 61 U/L (ref 39–117)
BUN: 11 mg/dL (ref 6–23)
CO2: 26 mEq/L (ref 19–32)
Calcium: 9.3 mg/dL (ref 8.4–10.5)
Chloride: 97 mEq/L (ref 96–112)
Creatinine, Ser: 0.78 mg/dL (ref 0.50–1.35)
GFR calc Af Amer: >90 mL/min (ref >90)
GFR calc non Af Amer: >90 mL/min (ref >90)
Glucose, Bld: 89 mg/dL (ref 70–99)
Potassium: 3.4 mEq/L — ABNORMAL LOW (ref 3.5–5.1)
Sodium: 133 mEq/L — ABNORMAL LOW (ref 135–145)
Total Bilirubin: 0.2 mg/dL — ABNORMAL LOW (ref 0.3–1.2)
Total Protein: 7.8 g/dL (ref 6.0–8.3)

## 2011-09-09 LAB — URINALYSIS, ROUTINE W REFLEX MICROSCOPIC
Bilirubin Urine: NEGATIVE
Glucose, UA: NEGATIVE mg/dL
Hgb urine dipstick: NEGATIVE
Ketones, ur: NEGATIVE mg/dL
Leukocytes, UA: NEGATIVE
Nitrite: NEGATIVE
Protein, ur: NEGATIVE mg/dL
Specific Gravity, Urine: 1.012 (ref 1.005–1.030)
Urobilinogen, UA: 0.2 mg/dL (ref 0.0–1.0)
pH: 6 (ref 5.0–8.0)

## 2011-09-09 LAB — LIPASE, BLOOD: Lipase: 30 U/L (ref 11–59)

## 2011-09-09 MED ORDER — DIAZEPAM 5 MG/ML IJ SOLN
5.0000 mg | Freq: Once | INTRAMUSCULAR | Status: AC
  Administered 2011-09-09: 5 mg via INTRAVENOUS
  Filled 2011-09-09: qty 2

## 2011-09-09 MED ORDER — METHYLPREDNISOLONE SODIUM SUCC 125 MG IJ SOLR
125.0000 mg | Freq: Once | INTRAMUSCULAR | Status: AC
  Administered 2011-09-09: 125 mg via INTRAVENOUS
  Filled 2011-09-09: qty 2

## 2011-09-09 MED ORDER — HYDROMORPHONE HCL PF 1 MG/ML IJ SOLN
1.0000 mg | Freq: Once | INTRAMUSCULAR | Status: AC
  Administered 2011-09-09: 1 mg via INTRAVENOUS
  Filled 2011-09-09: qty 1

## 2011-09-09 MED ORDER — IOHEXOL 300 MG/ML  SOLN
100.0000 mL | Freq: Once | INTRAMUSCULAR | Status: AC | PRN
  Administered 2011-09-09: 100 mL via INTRAVENOUS

## 2011-09-09 MED ORDER — SODIUM CHLORIDE 0.9 % IV BOLUS (SEPSIS)
1000.0000 mL | Freq: Once | INTRAVENOUS | Status: AC
  Administered 2011-09-09: 1000 mL via INTRAVENOUS

## 2011-09-09 MED ORDER — OXYCODONE-ACETAMINOPHEN 5-325 MG PO TABS: 1.0000 | ORAL_TABLET | ORAL | Status: AC | PRN

## 2011-09-09 MED ORDER — ONDANSETRON HCL 4 MG/2ML IJ SOLN
4.0000 mg | Freq: Once | INTRAMUSCULAR | Status: AC
  Administered 2011-09-09: 4 mg via INTRAVENOUS
  Filled 2011-09-09: qty 2

## 2011-09-09 MED ORDER — PREDNISONE 20 MG PO TABS: 40.0000 mg | ORAL_TABLET | Freq: Every day | ORAL | Status: AC

## 2011-09-09 NOTE — ED Notes (Signed)
Pt woke up with abdominal pain and back pain, hx of crohn disease. No N/V/D.

## 2011-09-09 NOTE — ED Notes (Signed)
OIP:[id number]<MK> Expected date:<BR> Expected time:<BR> Means of arrival:<BR> Comments:<BR> EMS/abd pain/hx Crohn&#39;s disease

## 2011-09-09 NOTE — ED Notes (Signed)
Pt sts waking up with severe back pain 10/10, hx of back pain and crohn in past, recently been treated for crohn dx, reports he is not having the crohn pain. Denied any recent injury to his back. Pt is ABC intact. No N/V.D

## 2011-09-09 NOTE — Discharge Instructions (Signed)
Crohn's Disease Crohn's disease is a long-term (chronic) soreness and redness (inflammation) of the intestines (bowel). It can affect any portion of the digestive tract, from the mouth to the anus. It can also cause problems outside the digestive tract. Crohn's disease is closely related to a disease called ulcerative colitis (together, these two diseases are called inflammatory bowel disease).  CAUSES  The cause of Crohn's disease is not known. One Link Snuffer is that, in an easily affected (susceptible) person, the immune system is triggered to attack the body's own digestive tissue. Crohn's disease runs in families. It seems to be more common in certain geographic areas and amongst certain races. There are no clear-cut dietary causes.  SYMPTOMS  Crohn's disease can cause many different symptoms since it can affect many different parts of the body. Symptoms include:  Fatigue.   Weight loss.   Chronic diarrhea, sometime bloody.   Abdominal pain and cramps.   Fever.   Ulcers or canker sores in the mouth or rectum.   Anemia (low red blood cells).   Arthritis, skin problems, and eye problems may occur.  Complications of Crohn's disease can include:  Series of holes (perforation) of the bowel.   Portions of the intestines sticking to each other (adhesions).   Obstruction of the bowel.   Fistula formation, typically in the rectal area but also in other areas. A fistula is an opening between the bowels and the outside, or between the bowels and another organ.   A painful crack in the mucous membrane of the anus (rectal fissure).  DIAGNOSIS  Your caregiver may suspect Crohn's disease based on your symptoms and an exam. Blood tests may confirm that there is a problem. You may be asked to submit a stool specimen for examination. X-rays and CT scans may be necessary. Ultimately, the diagnosis is usually made after a procedure that uses a flexible tube that is inserted via your mouth or your anus.  This is done under sedation and is called either an upper endoscopy or colonoscopy. With these tests, the specialist can take tiny tissue samples and remove them from the inside of the bowel (biopsy). Examination of this biopsy tissue under a microscope can reveal Crohn's disease as the cause of your symptoms. Due to the many different forms that Crohn's disease can take, symptoms may be present for several years before a diagnosis is made. HOME CARE INSTRUCTIONS   There is no cure for Crohn's disease. The best treatment is frequent checkups with your caregiver.   Symptoms such as diarrhea can be controlled with medications. Avoid foods that have a laxative effect such as fresh fruit, vegetables and dairy products. During flare ups, you can rest your bowel by refraining from solid foods. Drink clear liquids frequently during the day (electrolyte or re-hydrating fluids are best. Your caregiver can help you with suggestions). Drink often to prevent loss of body fluids (dehydration). When diarrhea has cleared, eat small meals and more frequently. Avoid food additives and stimulants such as caffeine (coffee, tea, or chocolate). Enzyme supplements may help if you develop intolerance to a sugar in dairy products (lactose). Ask your caregiver or dietitian about specific dietary instructions.   Try to maintain a positive attitude. Learn relaxation techniques such as self hypnosis, mental imaging, and muscle relaxation.   If possible, avoid stresses which can aggravate your condition.   Exercise regularly.   Follow your diet.   Always get plenty of rest.  SEEK MEDICAL CARE IF:   Your symptoms  fail to improve after a week or two of new treatment.   You experience continued weight loss.   You have ongoing crampy digestion or loose bowels.   You develop a new skin rash, skin sores, or eye problems.  SEEK IMMEDIATE MEDICAL CARE IF:   You have worsening of your symptoms or develop new symptoms.   You  have a fever.   You develop bloody diarrhea.   You develop severe abdominal pain.  MAKE SURE YOU:   Understand these instructions.   Will watch your condition.   Will get help right away if you are not doing well or get worse.  Document Released: 01/20/2005 Document Revised: 04/01/2011 Document Reviewed: 12/19/2006 [facility] Patient Information 2012 [place].

## 2011-09-09 NOTE — ED Notes (Signed)
Pt. came to registration window and requested bus pass. Christianne Borrow, RN and she confirmed pt. had not been given bus pass and to call charge nurse. -- Spoke with [place] and she approved pt. to have 1 bus pass.

## 2011-09-09 NOTE — ED Provider Notes (Signed)
History    20yM with mid to lower back pain. Seen by myself in ED a couple days ago for same. Not improved since. Pain is achy and constant. Hx of crohn's dz but says different than symptoms has had with it previously. Occasionally will have pain that wraps around into b/l flanks. No fever or chills. Denies trauma. No n/v. No urinary complaints. Was started on keflex last visit for questionable UTI and reports compliance with meds. No rash. Denies hx of back surgery. Denies hx of IV drug use. Denies use of blood thinning medications. No numbness, tingling or loss of strength. No bladder or bowel incontinence or retention.   CSN: [id number]  Arrival date & time 09/09/11  6314   First MD Initiated Contact with Patient 09/09/11 [phone]      Chief Complaint  Patient presents with  . Back Pain  . Abdominal Pain    (Consider location/radiation/quality/duration/timing/severity/associated sxs/prior treatment) HPI  Past Medical History  Diagnosis Date  . Crohn's disease   . Crohn disease     No past surgical history on file.  No family history on file.  History  Substance Use Topics  . Smoking status: Current Some Day Smoker  . Smokeless tobacco: Not on file  . Alcohol Use: Yes      Review of Systems   Review of symptoms negative unless otherwise noted in HPI.   Allergies  Review of patient's allergies indicates no known allergies.  Home Medications   Current Outpatient Rx  Name Route Sig Dispense Refill  . ACETAMINOPHEN 500 MG PO TABS Oral Take 1,000 mg by mouth every 6 (six) hours as needed. pain     . CEPHALEXIN 500 MG PO CAPS Oral Take 1 capsule (500 mg total) by mouth 4 (four) times daily. 20 capsule 0  . OXYCODONE-ACETAMINOPHEN 5-325 MG PO TABS Oral Take 1-2 tablets by mouth every 4 (four) hours as needed for pain. 15 tablet 0    BP 154/98  Pulse 84  Temp(Src) 97.8 F (36.6 C) (Oral)  Resp 24  SpO2 100%  Physical Exam  Nursing note and vitals  reviewed. Constitutional: He appears well-developed and well-nourished. No distress.  HENT:  Head: Normocephalic and atraumatic.  Eyes: Conjunctivae are normal. Right eye exhibits no discharge. Left eye exhibits no discharge.  Neck: Neck supple.  Cardiovascular: Normal rate, regular rhythm and normal heart sounds.  Exam reveals no gallop and no friction rub.   No murmur heard. Pulmonary/Chest: Effort normal and breath sounds normal. No respiratory distress.  Abdominal: Soft. He exhibits no distension. There is no tenderness.       No distension. No tenderness. No mass palpated.  Musculoskeletal: He exhibits no edema and no tenderness.       Back pain not reproducible. No midline tenderness or paraspinally. No concerning skin lesions noted. No CVA tenderness.  Neurological: He is alert. He exhibits normal muscle tone. Coordination normal.       Strength 5/5 b/l LE. Gait steady. Sensation intact to light touch. Patellar reflexes 1+ b/l.   Skin: Skin is warm and dry.  Psychiatric: He has a normal mood and affect. His behavior is normal. Thought content normal.    ED Course  Procedures (including critical care time)  Labs Reviewed  CBC - Abnormal; Notable for the following:    Hemoglobin 12.3 (*)    HCT 36.1 (*)    MCV 76.3 (*)    Platelets 494 (*)    All other components within normal  limits  COMPREHENSIVE METABOLIC PANEL - Abnormal; Notable for the following:    Sodium 133 (*)    Potassium 3.4 (*)    Total Bilirubin 0.2 (*)    All other components within normal limits  URINALYSIS, ROUTINE W REFLEX MICROSCOPIC  LIPASE, BLOOD   Ct Abdomen Pelvis W Contrast  09/09/2011  *RADIOLOGY REPORT*  Clinical Data: Back pain, diffuse abdominal pain, nausea, diarrhea. History of Crohn's disease.  CT ABDOMEN AND PELVIS WITH CONTRAST  Technique:  Multidetector CT imaging of the abdomen and pelvis was performed following the standard protocol during bolus administration of intravenous contrast.   Contrast: 156m OMNIPAQUE IOHEXOL 300 MG/ML  SOLN  Comparison: 10/15/2010  Findings: Lung bases are clear.  Liver is notable for probable focal fat along the falciform ligament (series 2/image 30), although more conspicuous than on recent prior studies.  Spleen, pancreas, and adrenal glands are within normal limits.  Gallbladder is unremarkable.  No intrahepatic or extrahepatic ductal dilatation.  Kidneys within normal limits.  No hydronephrosis.  No evidence of bowel obstruction.  Abnormal loops of distal ileum in the pelvis (series 2/image 72) with associated stasis (series 2/image 64) just prior to a stricture of the distal/terminal ileum with superimposed active inflammatory changes (series 2/image 55).  Tethering/inflammatory changes involving adjacent loops of small bowel in the lower abdomen, just to the right of midline, raising the possibility of adhesions and/or enteroenteric fistula (series 2/image 56).  No drainable fluid collection or abscess.  No evidence of abdominal aortic aneurysm.  Lower abdominal mesenteric lymph nodes measuring up to 1.7 cm short axis (series 2/image 57), likely reactive, although increased.  Small volume pelvic ascites.  Prostate is unremarkable.  Bladder is mildly thick-walled (series 2/image 78).  Visualized osseous structures are within normal limits.  No evidence of sacroiliitis.  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  Mild enteric stasis without frank small bowel obstruction.  Tethering/inflammatory changes involving adjacent loops of small bowel in the lower abdomen, raising the possibility of adhesions and/or enteroenteric fistula.  No drainable fluid collection or abscess.  Lower abdominal lymphadenopathy measuring up to 1.7 cm short-axis, likely reactive, although increased.  Mildly thick-walled bladder.  Original Report Authenticated By: SJulian Hy M.D.     1. Crohn's regional  enteritis   2. Back pain       MDM  20yM with back pain. No trauma. Doubt fx. Hx of crohn's but no chronic steroid use. Doubt vertebral osteomyelitis/discitis. Afebrile and denies hx of IV drug use. No symptoms/exam findings of spinal cord impingement. Not on blood thinning medications nor does have hx of back surgery. Doubt SEA/SEH. Do not get sense that biliary or renal colic. Atypical for previous previous crohn's exacerbation and abdominal exam remains benign just as last visit. No urinary complaints. Given no change in symptoms and history though will now CT and obtain further labs. IVF, pain meds and reassessment.   CT subsequently showed changes consistent with exacerbations of crohn's. No evidence of perforation or abscess. Pain currently controlled. Plan course of steroids and GI follow-up.        SVirgel Manifold MD 09/09/11 [phone]

## 2012-03-01 ENCOUNTER — Emergency Department (HOSPITAL_COMMUNITY): Payer: Self-pay

## 2012-03-01 ENCOUNTER — Inpatient Hospital Stay (HOSPITAL_COMMUNITY)
Admission: EM | Admit: 2012-03-01 | Discharge: 2012-03-03 | DRG: 386 | Disposition: A | Discharge status: Home / Self Care | Payer: MEDICAID | Attending: Surgery | Admitting: Surgery

## 2012-03-01 ENCOUNTER — Encounter (HOSPITAL_COMMUNITY): Payer: Self-pay | Admitting: *Deleted

## 2012-03-01 DIAGNOSIS — K2980 Duodenitis without bleeding: Secondary | ICD-10-CM | POA: Diagnosis present

## 2012-03-01 DIAGNOSIS — K900 Celiac disease: Secondary | ICD-10-CM | POA: Diagnosis present

## 2012-03-01 DIAGNOSIS — K56609 Unspecified intestinal obstruction, unspecified as to partial versus complete obstruction: Secondary | ICD-10-CM | POA: Diagnosis present

## 2012-03-01 DIAGNOSIS — IMO0002 Reserved for concepts with insufficient information to code with codable children: Secondary | ICD-10-CM

## 2012-03-01 DIAGNOSIS — Z23 Encounter for immunization: Secondary | ICD-10-CM

## 2012-03-01 DIAGNOSIS — K50919 Crohn's disease, unspecified, with unspecified complications: Secondary | ICD-10-CM | POA: Diagnosis present

## 2012-03-01 DIAGNOSIS — K50912 Crohn's disease, unspecified, with intestinal obstruction: Secondary | ICD-10-CM | POA: Diagnosis present

## 2012-03-01 DIAGNOSIS — D649 Anemia, unspecified: Secondary | ICD-10-CM | POA: Diagnosis present

## 2012-03-01 DIAGNOSIS — D509 Iron deficiency anemia, unspecified: Secondary | ICD-10-CM | POA: Diagnosis present

## 2012-03-01 DIAGNOSIS — R634 Abnormal weight loss: Secondary | ICD-10-CM | POA: Diagnosis present

## 2012-03-01 DIAGNOSIS — R1084 Generalized abdominal pain: Secondary | ICD-10-CM | POA: Diagnosis present

## 2012-03-01 DIAGNOSIS — K5080 Crohn's disease of both small and large intestine without complications: Principal | ICD-10-CM | POA: Diagnosis present

## 2012-03-01 DIAGNOSIS — R188 Other ascites: Secondary | ICD-10-CM | POA: Diagnosis present

## 2012-03-01 DIAGNOSIS — Z87891 Personal history of nicotine dependence: Secondary | ICD-10-CM

## 2012-03-01 DIAGNOSIS — K5090 Crohn's disease, unspecified, without complications: Secondary | ICD-10-CM

## 2012-03-01 HISTORY — DX: Pneumoconiosis due to other dust containing silica: J62.8

## 2012-03-01 LAB — URINALYSIS, ROUTINE W REFLEX MICROSCOPIC
Glucose, UA: NEGATIVE mg/dL
Ketones, ur: NEGATIVE mg/dL
Protein, ur: NEGATIVE mg/dL

## 2012-03-01 LAB — COMPREHENSIVE METABOLIC PANEL
AST: 18 U/L (ref 0–37)
BUN: 17 mg/dL (ref 6–23)
CO2: 27 mEq/L (ref 19–32)
Chloride: 101 mEq/L (ref 96–112)
Creatinine, Ser: 0.87 mg/dL (ref 0.50–1.35)
GFR calc Af Amer: >90 mL/min (ref >90)
GFR calc non Af Amer: >90 mL/min (ref >90)
Glucose, Bld: 75 mg/dL (ref 70–99)
Total Bilirubin: 0.2 mg/dL — ABNORMAL LOW (ref 0.3–1.2)

## 2012-03-01 LAB — CBC WITH DIFFERENTIAL/PLATELET
HCT: 27.9 % — ABNORMAL LOW (ref 39.0–52.0)
Hemoglobin: 9.4 g/dL — ABNORMAL LOW (ref 13.0–17.0)
Lymphocytes Relative: 17 % (ref 12–46)
Lymphs Abs: 0.9 10*3/uL (ref 0.7–4.0)
MCV: 78.6 fL (ref 78.0–100.0)
Monocytes Absolute: 0.7 10*3/uL (ref 0.1–1.0)
Monocytes Relative: 13 % — ABNORMAL HIGH (ref 3–12)
Neutro Abs: 3.5 10*3/uL (ref 1.7–7.7)
WBC: 5.5 10*3/uL (ref 4.0–10.5)

## 2012-03-01 LAB — URINE MICROSCOPIC-ADD ON

## 2012-03-01 MED ORDER — ONDANSETRON HCL 4 MG/2ML IJ SOLN
4.0000 mg | Freq: Once | INTRAMUSCULAR | Status: AC
  Administered 2012-03-01: 4 mg via INTRAVENOUS
  Filled 2012-03-01: qty 2

## 2012-03-01 MED ORDER — INFLUENZA VIRUS VACC SPLIT PF IM SUSP
0.5000 mL | INTRAMUSCULAR | Status: AC
  Administered 2012-03-02: 0.5 mL via INTRAMUSCULAR
  Filled 2012-03-01: qty 0.5

## 2012-03-01 MED ORDER — ACETAMINOPHEN 650 MG RE SUPP: 650.0000 mg | Freq: Four times a day (QID) | RECTAL | Status: DC | PRN

## 2012-03-01 MED ORDER — ENOXAPARIN SODIUM 40 MG/0.4ML ~~LOC~~ SOLN
40.0000 mg | SUBCUTANEOUS | Status: DC
  Administered 2012-03-02: 40 mg via SUBCUTANEOUS
  Filled 2012-03-01 (×3): qty 0.4

## 2012-03-01 MED ORDER — IOHEXOL 300 MG/ML  SOLN
100.0000 mL | Freq: Once | INTRAMUSCULAR | Status: AC | PRN
  Administered 2012-03-01: 100 mL via INTRAVENOUS

## 2012-03-01 MED ORDER — SODIUM CHLORIDE 0.9 % IV SOLN
Freq: Once | INTRAVENOUS | Status: AC
  Administered 2012-03-01: 09:00:00 via INTRAVENOUS

## 2012-03-01 MED ORDER — PNEUMOCOCCAL VAC POLYVALENT 25 MCG/0.5ML IJ INJ
0.5000 mL | INJECTION | INTRAMUSCULAR | Status: AC
  Administered 2012-03-02: 0.5 mL via INTRAMUSCULAR
  Filled 2012-03-01: qty 0.5

## 2012-03-01 MED ORDER — METHYLPREDNISOLONE SODIUM SUCC 40 MG IJ SOLR
40.0000 mg | Freq: Three times a day (TID) | INTRAMUSCULAR | Status: DC
  Administered 2012-03-01 – 2012-03-03 (×5): 40 mg via INTRAVENOUS
  Filled 2012-03-01 (×8): qty 1

## 2012-03-01 MED ORDER — HYDROMORPHONE HCL PF 1 MG/ML IJ SOLN
1.0000 mg | Freq: Once | INTRAMUSCULAR | Status: AC
Start: 2012-03-01 — End: 2012-03-01
  Administered 2012-03-01: 1 mg via INTRAVENOUS
  Filled 2012-03-01: qty 1

## 2012-03-01 MED ORDER — MORPHINE SULFATE 2 MG/ML IJ SOLN
1.0000 mg | INTRAMUSCULAR | Status: DC | PRN
Start: 2012-03-01 — End: 2012-03-03
  Administered 2012-03-01: 2 mg via INTRAVENOUS
  Administered 2012-03-01 (×2): 4 mg via INTRAVENOUS
  Filled 2012-03-01: qty 2
  Filled 2012-03-01: qty 1
  Filled 2012-03-01: qty 2

## 2012-03-01 MED ORDER — SODIUM CHLORIDE 0.9 % IV SOLN
Freq: Once | INTRAVENOUS | Status: AC
  Administered 2012-03-01: 13:00:00 via INTRAVENOUS

## 2012-03-01 MED ORDER — KCL IN DEXTROSE-NACL 20-5-0.9 MEQ/L-%-% IV SOLN
INTRAVENOUS | Status: DC
  Administered 2012-03-01 – 2012-03-03 (×4): via INTRAVENOUS
  Filled 2012-03-01 (×8): qty 1000

## 2012-03-01 MED ORDER — ACETAMINOPHEN 325 MG PO TABS: 650.0000 mg | ORAL_TABLET | Freq: Four times a day (QID) | ORAL | Status: DC | PRN

## 2012-03-01 MED ORDER — HYDROMORPHONE HCL PF 1 MG/ML IJ SOLN
1.0000 mg | Freq: Once | INTRAMUSCULAR | Status: AC
  Administered 2012-03-01: 1 mg via INTRAVENOUS
  Filled 2012-03-01: qty 1

## 2012-03-01 MED ORDER — FAMOTIDINE IN NACL 20-0.9 MG/50ML-% IV SOLN
20.0000 mg | Freq: Two times a day (BID) | INTRAVENOUS | Status: DC
  Administered 2012-03-01 – 2012-03-03 (×4): 20 mg via INTRAVENOUS
  Filled 2012-03-01 (×6): qty 50

## 2012-03-01 MED ORDER — ONDANSETRON HCL 4 MG/2ML IJ SOLN: 4.0000 mg | Freq: Four times a day (QID) | INTRAMUSCULAR | Status: DC | PRN

## 2012-03-01 NOTE — Consult Note (Signed)
Reason for Consult: Crohn's disease Referring Physician: Surgical team  Shane Klein is an 21 y.o. male.  HPI: Patient seen in the past by a different GI group but has been dismissed and we are asked to see the patient who is feeling better with his NG tube and is history was reviewed as was his previous workup on the computer and his pain is better but is not passing any air from below and he has lost about 30 pounds in the last 5 months and has no other specific complaints and although he thinks he has celiac disease his blood test and biopsies 3 years ago were nondiagnostic and 1 aunt does have Crohn's disease but he denies any other hospital stays other than here and says prednisone in the past did help him  Past Medical History  Diagnosis Date  . Crohn's disease   . Crohn disease   . 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. Celiac disease   . Crohn's disease with intestinal obstruction 03/01/2012  . Celiac disease 03/01/2012  . Anemia 03/01/2012    History reviewed. No pertinent past surgical history.  History reviewed. No pertinent family history.  Social History:  reports that he quit smoking about 2 years ago. His smoking use included Cigarettes. He has never used smokeless tobacco. He reports that he drinks alcohol. He reports that he does not use illicit drugs.  Allergies:  Allergies  Allergen Reactions  . Wheat Bran Other (See Comments)    Belly pain    Medications: I have reviewed the patient's current medications.  Results for orders placed during the hospital encounter of 03/01/12 (from the past 48 hour(s))  URINALYSIS, ROUTINE W REFLEX MICROSCOPIC     Status: Abnormal   Collection Time   03/01/12  8:45 AM      Component Value Range Comment   Color, Urine YELLOW  YELLOW    APPearance CLEAR  CLEAR    Specific Gravity, Urine 1.020  1.005 - 1.030    pH 7.0  5.0 - 8.0    Glucose, UA NEGATIVE  NEGATIVE mg/dL    Hgb urine dipstick NEGATIVE  NEGATIVE    Bilirubin Urine NEGATIVE  NEGATIVE    Ketones, ur NEGATIVE  NEGATIVE mg/dL    Protein, ur NEGATIVE  NEGATIVE mg/dL    Urobilinogen, UA 0.2  0.0 - 1.0 mg/dL    Nitrite NEGATIVE  NEGATIVE    Leukocytes, UA SMALL (*) NEGATIVE   URINE MICROSCOPIC-ADD ON     Status: Normal   Collection Time   03/01/12  8:45 AM      Component Value Range Comment   Squamous Epithelial / LPF RARE  RARE    WBC, UA 0-2  <3 WBC/hpf    Bacteria, UA RARE  RARE    Urine-Other SPERM PRESENT     CBC WITH DIFFERENTIAL     Status: Abnormal   Collection Time   03/01/12  9:00 AM      Component Value Range Comment   WBC 5.5  4.0 - 10.5 K/uL    RBC 3.55 (*) 4.22 - 5.81 MIL/uL    Hemoglobin 9.4 (*) 13.0 - 17.0 g/dL    HCT 82.9 (*) 56.2 - 52.0 %    MCV 78.6  78.0 - 100.0 fL    MCH 26.5  26.0 - 34.0 pg    MCHC 33.7  30.0 - 36.0 g/dL    RDW 13.0 (*) 86.5 - 15.5 %    Platelets 486 (*) 150 - 400  K/uL    Neutrophils Relative 64  43 - 77 %    Neutro Abs 3.5  1.7 - 7.7 K/uL    Lymphocytes Relative 17  12 - 46 %    Lymphs Abs 0.9  0.7 - 4.0 K/uL    Monocytes Relative 13 (*) 3 - 12 %    Monocytes Absolute 0.7  0.1 - 1.0 K/uL    Eosinophils Relative 6 (*) 0 - 5 %    Eosinophils Absolute 0.3  0.0 - 0.7 K/uL    Basophils Relative 1  0 - 1 %    Basophils Absolute 0.1  0.0 - 0.1 K/uL   COMPREHENSIVE METABOLIC PANEL     Status: Abnormal   Collection Time   03/01/12  9:00 AM      Component Value Range Comment   Sodium 136  135 - 145 mEq/L    Potassium 3.8  3.5 - 5.1 mEq/L    Chloride 101  96 - 112 mEq/L    CO2 27  19 - 32 mEq/L    Glucose, Bld 75  70 - 99 mg/dL    BUN 17  6 - 23 mg/dL    Creatinine, Ser 1.61  0.50 - 1.35 mg/dL    Calcium 9.1  8.4 - 09.6 mg/dL    Total Protein 6.7  6.0 - 8.3 g/dL    Albumin 3.1 (*) 3.5 - 5.2 g/dL    AST 18  0 - 37 U/L    ALT 11  0 - 53 U/L    Alkaline Phosphatase 53  39 - 117 U/L    Total Bilirubin 0.2 (*) 0.3 - 1.2 mg/dL    GFR calc non Af Amer >90  >90 mL/min    GFR calc Af Amer >90  >90 mL/min   LIPASE, BLOOD     Status:  Normal   Collection Time   03/01/12  9:00 AM      Component Value Range Comment   Lipase 32  11 - 59 U/L     Ct Abdomen Pelvis W Contrast  03/01/2012  *RADIOLOGY REPORT*  Clinical Data: Right lower quadrant pain.  Constipation.  Crohn's disease.  CT ABDOMEN AND PELVIS WITH CONTRAST  Technique:  Multidetector CT imaging of the abdomen and pelvis was performed following the standard protocol during bolus administration of intravenous contrast.  Contrast: OMNIPAQUE IOHEXOL 300 MG/ML  SOLN  Comparison: 09/09/2011.  Findings: Lung bases clear.  Small to moderate volume of ascites is present.  The liver, gallbladder, pancreas, kidneys and adrenal glands appear within normal limits.  Spleen appears within normal limits.  There is dilated terminal ileum extending up to the ileocecal valve.  Hyperenhancement mucosa is present and fecalization of contents. The appendix is not identified.  Phlegmon is present in the right lower quadrant.  Focal transition point is identified in the right lower quadrant (image number 58 series 2).  There is no pneumatosis.  Maximal diameter of small bowel loops is about 5 cm.  The obstruction appears complete although the stool is present within the colon. Urinary bladder appears normal.  No aggressive osseous lesions. Reactive mesenteric lymph nodes in the right lower quadrant.  IMPRESSION: 1.  Complete small bowel obstruction of the terminal ileum with dilated small bowel measuring up to 5 cm.  The appearance is compatible with stricture of the terminal ileum associated with inflammatory bowel disease.  Exacerbation of Crohn's disease complicating stricture is likely. 2.  Small to moderate volume of ascites.  Original Report Authenticated By: Andreas Newport, M.D.     ROS negative except above Blood pressure 141/97, pulse 85, temperature 98 F (36.7 C), temperature source Oral, resp. rate 16, height 5\' 11"  (1.803 m), weight 79.289 kg (174 lb 12.8 oz), SpO2 100.00%. Physical  Exam vital signs stable afebrile no acute distress abdomen is soft nontender minimal bowel sound labs reviewed CT reviewed and discussed with radiology very minimal inflammation around the stricture and no signs of any other areas of Crohn's  Assessment/Plan: Crohn's disease with chronic stricture Plan: Even though the CT does not imply much inflammation a trial of steroids is warranted however if he was compliant and would keep followup might even consider trying Remicade or Humira and he would need hepatitis studies and a PPD prior to starting however his reliability is questioned and possibly an operation now and then keeping him on medicines postop to try to prevent a recurrence may be in his best interest particularly if his compliance with followup is going to be a problem will follow with you Maribella Kuna E 03/01/2012, 9:15 PM

## 2012-03-01 NOTE — ED Notes (Signed)
Pt alert and oriented x4. Respirations even and unlabored, bilateral symmetrical rise and fall of chest. Skin warm and dry. In no acute distress. Denies needs.   

## 2012-03-01 NOTE — ED Provider Notes (Signed)
History     CSN: [id number]  Arrival date & time 03/01/12  4098   First MD Initiated Contact with Patient 03/01/12 [phone]      Chief Complaint  Patient presents with  . Abdominal Pain    (Consider location/radiation/quality/duration/timing/severity/associated sxs/prior treatment) Patient is a 21 y.o. male presenting with abdominal pain. The history is provided by the patient (the pt complains of abd pain.  he has a hx of chrons). No language interpreter was used.  Abdominal Pain The primary symptoms of the illness include abdominal pain. The primary symptoms of the illness do not include fever, fatigue or diarrhea. The current episode started 2 days ago. The onset of the illness was gradual. The problem has not changed since onset. Associated with: nothing. The patient states that she believes she is currently not pregnant. The patient has had a change in bowel habit. Risk factors: chrons disease. Symptoms associated with the illness do not include chills, hematuria, frequency or back pain. Significant associated medical issues do not include PUD.    Past Medical History  Diagnosis Date  . Crohn's disease   . Crohn disease   . 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History reviewed. No pertinent past surgical history.  No family history on file.  History  Substance Use Topics  . Smoking status: Former Games developer  . Smokeless tobacco: Not on file  . Alcohol Use: Yes      Review of Systems  Constitutional: Negative for fever, chills and fatigue.  HENT: Negative for congestion, sinus pressure and ear discharge.   Eyes: Negative for discharge.  Respiratory: Negative for cough.   Cardiovascular: Negative for chest pain.  Gastrointestinal: Positive for abdominal pain. Negative for diarrhea.  Genitourinary: Negative for frequency and hematuria.  Musculoskeletal: Negative for back pain.  Skin: Negative for rash.  Neurological: Negative for seizures and headaches.  Hematological: Negative.     Psychiatric/Behavioral: Negative for hallucinations.    Allergies  Review of patient's allergies indicates no known allergies.  Home Medications   Current Outpatient Rx  Name  Route  Sig  Dispense  Refill  . ACETAMINOPHEN 500 MG PO TABS   Oral   Take 1,000 mg by mouth every 6 (six) hours as needed. pain          . DOCUSATE SODIUM 100 MG PO CAPS   Oral   Take 100 mg by mouth daily.         Marland Kitchen RANITIDINE HCL 75 MG PO TABS   Oral   Take 75 mg by mouth 2 (two) times daily.           BP 139/78  Pulse 72  Temp 97.6 F (36.4 C) (Oral)  Resp 16  SpO2 100%  Physical Exam  Constitutional: He is oriented to person, place, and time. He appears well-developed.  HENT:  Head: Normocephalic and atraumatic.  Eyes: Conjunctivae normal and EOM are normal. No scleral icterus.  Neck: Neck supple. No thyromegaly present.  Cardiovascular: Normal rate and regular rhythm.  Exam reveals no gallop and no friction rub.   No murmur heard. Pulmonary/Chest: No stridor. He has no wheezes. He has no rales. He exhibits no tenderness.  Abdominal: He exhibits distension. There is tenderness. There is no rebound.  Musculoskeletal: Normal range of motion. He exhibits no edema.  Lymphadenopathy:    He has no cervical adenopathy.  Neurological: He is oriented to person, place, and time. Coordination normal.  Skin: No rash noted. No erythema.  Psychiatric: He has  a normal mood and affect. His behavior is normal.    ED Course  Procedures (including critical care time)  Labs Reviewed  CBC WITH DIFFERENTIAL - Abnormal; Notable for the following:    RBC 3.55 (*)     Hemoglobin 9.4 (*)     HCT 27.9 (*)     RDW 16.6 (*)     Platelets 486 (*)     Monocytes Relative 13 (*)     Eosinophils Relative 6 (*)     All other components within normal limits  COMPREHENSIVE METABOLIC PANEL - Abnormal; Notable for the following:    Albumin 3.1 (*)     Total Bilirubin 0.2 (*)     All other components  within normal limits  URINALYSIS, ROUTINE W REFLEX MICROSCOPIC - Abnormal; Notable for the following:    Leukocytes, UA SMALL (*)     All other components within normal limits  LIPASE, BLOOD  URINE MICROSCOPIC-ADD ON   Ct Abdomen Pelvis W Contrast  03/01/2012  *RADIOLOGY REPORT*  Clinical Data: Right lower quadrant pain.  Constipation.  Crohn's disease.  CT ABDOMEN AND PELVIS WITH CONTRAST  Technique:  Multidetector CT imaging of the abdomen and pelvis was performed following the standard protocol during bolus administration of intravenous contrast.  Contrast: OMNIPAQUE IOHEXOL 300 MG/ML  SOLN  Comparison: 09/09/2011.  Findings: Lung bases clear.  Small to moderate volume of ascites is present.  The liver, gallbladder, pancreas, kidneys and adrenal glands appear within normal limits.  Spleen appears within normal limits.  There is dilated terminal ileum extending up to the ileocecal valve.  Hyperenhancement mucosa is present and fecalization of contents. The appendix is not identified.  Phlegmon is present in the right lower quadrant.  Focal transition point is identified in the right lower quadrant (image number 58 series 2).  There is no pneumatosis.  Maximal diameter of small bowel loops is about 5 cm.  The obstruction appears complete although the stool is present within the colon. Urinary bladder appears normal.  No aggressive osseous lesions. Reactive mesenteric lymph nodes in the right lower quadrant.  IMPRESSION: 1.  Complete small bowel obstruction of the terminal ileum with dilated small bowel measuring up to 5 cm.  The appearance is compatible with stricture of the terminal ileum associated with inflammatory bowel disease.  Exacerbation of Crohn's disease complicating stricture is likely. 2.  Small to moderate volume of ascites.   Original Report Authenticated By: Andreas Newport, M.D.      No diagnosis found.  Pt with sbo and surgery to see.  Will put ng tube down  MDM           Benny Lennert, MD 03/01/12 1244

## 2012-03-01 NOTE — H&P (Signed)
General Surgery [facility]  Patient seen and examined.  Distal SBO likely due to complications of known Crohn's disease.  Gastroenterology to evaluate and advise medical treatment regimen.  If patients fails, medical management, may need resection.  Discussed with patient at bedside.  Earnstine Regal, MD, [facility] Office: [phone]

## 2012-03-01 NOTE — Progress Notes (Signed)
[facility] ED Cm noted no pcp Cm spoke with pt who confirms no pcp CM offered written information and discussed available [place] self pay and medicaid providers along with information for financial and medication assistance resources plus contact information for health dept and DSS Pt voiced understanding and appreciation

## 2012-03-01 NOTE — H&P (Signed)
Shane Klein is an 21 y.o. male.   Chief Complaint: abdominal pain and distension HPI: And is a 21 year old African American male history of Crohn's disease going back to October 2010. He underwent colonoscopy at that time showing Crohn's in the ascending colon around the cecum. The terminal ileum could not be intubated CT and tomography showed disease involving the distal terminal ileum overlying to 15 cm as well as a possible developing stricture. He was eventually discharged on a steroid taper. It appears doubtful that he's been treated since that time. He was released yesterday from jail. He describes abdominal tightness on off every other day, generalized abdominal discomfort for months. 3 weeks ago he was started on sulfasalazine and has been taking it, he has a supply at home he received at discharge from the prison facility yesterday. Since discharge the stomach is become more tender and distended. He's not been able to have a bowel movement. He is having some nausea and presented to the ER. CT scan obtained in the ER shows complete small bowel obstruction of the terminal ileum with dilated small bowel up to 5 cm. This is compatible with a stricture of the terminal ileum associated with inflammatory bowel disease, and Crohn's exacerbation. There is also some moderate ascites. Labs on admission shows his electrolytes are normal albumin is low. WBC is 5500, H/H= 9.4/27. Because his complete obstruction we were asked to the patient in consultation by the ER physician.  Past Medical History  Diagnosis Date  . Crohn's disease   . Crohn disease   . 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. Celiac disease   . Crohn's disease with intestinal obstruction 03/01/2012  . Celiac disease 03/01/2012  . Anemia 03/01/2012    History reviewed. No pertinent past surgical history. NO  Surgeries  History reviewed. No pertinent family history. Social History:  reports that he quit smoking about 2 years ago. His smoking use included  Cigarettes. He has never used smokeless tobacco. He reports that he drinks alcohol. He reports that he does not use illicit drugs.  Allergies:  Allergies  Allergen Reactions  . Wheat Bran Other (See Comments)    Belly pain   Prior to Admission medications   Medication Sig Start Date End Date Taking? Authorizing Provider  acetaminophen (TYLENOL) 500 MG tablet Take 1,000 mg by mouth every 6 (six) hours as needed. pain    Yes Historical Provider, MD  docusate sodium (COLACE) 100 MG capsule Take 100 mg by mouth daily.   Yes Historical Provider, MD  ranitidine (ZANTAC) 75 MG tablet Take 75 mg by mouth 2 (two) times daily.   Yes Historical Provider, MD     (Not in a hospital admission)  Results for orders placed during the hospital encounter of 03/01/12 (from the past 48 hour(s))  URINALYSIS, ROUTINE W REFLEX MICROSCOPIC     Status: Abnormal   Collection Time   03/01/12  8:45 AM      Component Value Range Comment   Color, Urine YELLOW  YELLOW    APPearance CLEAR  CLEAR    Specific Gravity, Urine 1.020  1.005 - 1.030    pH 7.0  5.0 - 8.0    Glucose, UA NEGATIVE  NEGATIVE mg/dL    Hgb urine dipstick NEGATIVE  NEGATIVE    Bilirubin Urine NEGATIVE  NEGATIVE    Ketones, ur NEGATIVE  NEGATIVE mg/dL    Protein, ur NEGATIVE  NEGATIVE mg/dL    Urobilinogen, UA 0.2  0.0 - 1.0 mg/dL  Nitrite NEGATIVE  NEGATIVE    Leukocytes, UA SMALL (*) NEGATIVE   URINE MICROSCOPIC-ADD ON     Status: Normal   Collection Time   03/01/12  8:45 AM      Component Value Range Comment   Squamous Epithelial / LPF RARE  RARE    WBC, UA 0-2  <3 WBC/hpf    Bacteria, UA RARE  RARE    Urine-Other SPERM PRESENT     CBC WITH DIFFERENTIAL     Status: Abnormal   Collection Time   03/01/12  9:00 AM      Component Value Range Comment   WBC 5.5  4.0 - 10.5 K/uL    RBC 3.55 (*) 4.22 - 5.81 MIL/uL    Hemoglobin 9.4 (*) 13.0 - 17.0 g/dL    HCT 27.9 (*) 39.0 - 52.0 %    MCV 78.6  78.0 - 100.0 fL    MCH 26.5  26.0 - 34.0  pg    MCHC 33.7  30.0 - 36.0 g/dL    RDW 16.6 (*) 11.5 - 15.5 %    Platelets 486 (*) 150 - 400 K/uL    Neutrophils Relative 64  43 - 77 %    Neutro Abs 3.5  1.7 - 7.7 K/uL    Lymphocytes Relative 17  12 - 46 %    Lymphs Abs 0.9  0.7 - 4.0 K/uL    Monocytes Relative 13 (*) 3 - 12 %    Monocytes Absolute 0.7  0.1 - 1.0 K/uL    Eosinophils Relative 6 (*) 0 - 5 %    Eosinophils Absolute 0.3  0.0 - 0.7 K/uL    Basophils Relative 1  0 - 1 %    Basophils Absolute 0.1  0.0 - 0.1 K/uL   COMPREHENSIVE METABOLIC PANEL     Status: Abnormal   Collection Time   03/01/12  9:00 AM      Component Value Range Comment   Sodium 136  135 - 145 mEq/L    Potassium 3.8  3.5 - 5.1 mEq/L    Chloride 101  96 - 112 mEq/L    CO2 27  19 - 32 mEq/L    Glucose, Bld 75  70 - 99 mg/dL    BUN 17  6 - 23 mg/dL    Creatinine, Ser 0.87  0.50 - 1.35 mg/dL    Calcium 9.1  8.4 - 10.5 mg/dL    Total Protein 6.7  6.0 - 8.3 g/dL    Albumin 3.1 (*) 3.5 - 5.2 g/dL    AST 18  0 - 37 U/L    ALT 11  0 - 53 U/L    Alkaline Phosphatase 53  39 - 117 U/L    Total Bilirubin 0.2 (*) 0.3 - 1.2 mg/dL    GFR calc non Af Amer >90  >90 mL/min    GFR calc Af Amer >90  >90 mL/min   LIPASE, BLOOD     Status: Normal   Collection Time   03/01/12  9:00 AM      Component Value Range Comment   Lipase 32  11 - 59 U/L    Ct Abdomen Pelvis W Contrast  03/01/2012  *RADIOLOGY REPORT*  Clinical Data: Right lower quadrant pain.  Constipation.  Crohn's disease.  CT ABDOMEN AND PELVIS WITH CONTRAST  Technique:  Multidetector CT imaging of the abdomen and pelvis was performed following the standard protocol during bolus administration of intravenous contrast.  Contrast: 168m OMNIPAQUE  IOHEXOL 300 MG/ML  SOLN  Comparison: 09/09/2011.  Findings: Lung bases clear.  Small to moderate volume of ascites is present.  The liver, gallbladder, pancreas, kidneys and adrenal glands appear within normal limits.  Spleen appears within normal limits.  There is dilated  terminal ileum extending up to the ileocecal valve.  Hyperenhancement mucosa is present and fecalization of contents. The appendix is not identified.  Phlegmon is present in the right lower quadrant.  Focal transition point is identified in the right lower quadrant (image number 58 series 2).  There is no pneumatosis.  Maximal diameter of small bowel loops is about 5 cm.  The obstruction appears complete although the stool is present within the colon. Urinary bladder appears normal.  No aggressive osseous lesions. Reactive mesenteric lymph nodes in the right lower quadrant.  IMPRESSION: 1.  Complete small bowel obstruction of the terminal ileum with dilated small bowel measuring up to 5 cm.  The appearance is compatible with stricture of the terminal ileum associated with inflammatory bowel disease.  Exacerbation of Crohn's disease complicating stricture is likely. 2.  Small to moderate volume of ascites.   Original Report Authenticated By: Dereck Ligas, M.D.     Review of Systems  Constitutional: Positive for weight loss (30 pounds last 5 months) and malaise/fatigue. Negative for fever, chills and diaphoresis.       No medical rx of his Crohn's disease for 1.5 years, first rx about 3 weeks ago.  HENT: Negative.   Eyes: Negative.   Respiratory: Negative.   Cardiovascular: Negative.   Gastrointestinal: Positive for nausea, vomiting (sometimes induces vomiting for relief of abdominal tightness) and constipation. Negative for heartburn and blood in stool. Abdominal pain: He has had pain and tightness of abdomen for months, whole time in jail,  just started on sulfasalazine 3 weeks agol.  Genitourinary: Negative.   Musculoskeletal: Positive for back pain (pain into back from abdominal pain).  Skin: Negative.   Neurological: Negative.  Negative for weakness.  Endo/Heme/Allergies: Negative.   Psychiatric/Behavioral: Negative.     Blood pressure 139/78, pulse 72, temperature 97.6 F (36.4 C),  temperature source Oral, resp. rate 16, SpO2 100.00%. Physical Exam  Constitutional: He is oriented to person, place, and time. No distress.       Thin BM NAD, abdomen is tight, he has NG draining and is miserable.  HENT:  Head: Normocephalic and atraumatic.  Nose: Nose normal.  Eyes: Conjunctivae normal and EOM are normal. Pupils are equal, round, and reactive to light. Right eye exhibits no discharge. Left eye exhibits no discharge. No scleral icterus.  Neck: Normal range of motion. Neck supple. No JVD present. No tracheal deviation present. No thyromegaly present.  Cardiovascular: Normal rate, regular rhythm, normal heart sounds and intact distal pulses.  Exam reveals no gallop.   No murmur heard. Respiratory: Effort normal and breath sounds normal. No stridor. No respiratory distress. He has no wheezes. He has no rales. He exhibits no tenderness.  GI: He exhibits distension (very distended, even with NG in place.). He exhibits no mass. There is tenderness (uncomfortable from distension, but not with peritonitis type tenderness.  discomfort is all over and not localized.). There is no rebound and no guarding.  Musculoskeletal: He exhibits no edema and no tenderness.  Lymphadenopathy:    He has no cervical adenopathy.  Neurological: He is alert and oriented to person, place, and time. He has normal reflexes. No cranial nerve deficit.  Skin: Skin is warm and dry. No rash  noted. He is not diaphoretic. No erythema.  Psychiatric: He has a normal mood and affect. His behavior is normal. Judgment and thought content normal.     Assessment/Plan 1. Crohn's disease with exacerbation and complete obstruction of the terminal ileum. 2. Reported celiac disease 3. Anemia. 4.He has not been on medical management of his disease for about 1-1/2 years. He was started on medications 3 weeks ago while incarcerated.   Plan: He requires admission, NG decompression, hydration, GI consult and medical management  of his Crohn's disease. He was dismissed from [place] so we will call unassigned GI today.  We hope he does not require surgery.  Will [facility] physician assistant for Dr. Armandina Gemma.  Shane Klein 03/01/2012, 3:10 PM

## 2012-03-01 NOTE — ED Notes (Signed)
Abd pain, no vomiting or diarrhea, has history of Chron's disease, states he can not use the bathroom,

## 2012-03-01 NOTE — ED Notes (Signed)
Pt c/o of abdominal pain that worsens while laying down and eating. States he vomited last night but not actively at the moment.

## 2012-03-02 LAB — MAGNESIUM: Magnesium: 2.1 mg/dL (ref 1.5–2.5)

## 2012-03-02 LAB — BASIC METABOLIC PANEL
Calcium: 8.9 mg/dL (ref 8.4–10.5)
Creatinine, Ser: 0.85 mg/dL (ref 0.50–1.35)
GFR calc non Af Amer: >90 mL/min (ref >90)
Glucose, Bld: 121 mg/dL — ABNORMAL HIGH (ref 70–99)
Sodium: 136 mEq/L (ref 135–145)

## 2012-03-02 NOTE — Progress Notes (Signed)
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

## 2012-03-02 NOTE — Progress Notes (Signed)
Patient ID: Shane Klein, male   DOB: November 14, 1990, 20 y.o.   MRN: [id number]    Subjective: Feels better. Abdomen is less tight. He denies pain. Has not had any flatus or bowel movements.  Objective: Vital signs in last 24 hours: Temp:  [97.6 F (36.4 C)-98.7 F (37.1 C)] 98 F (36.7 C) (11/07 0545) Pulse Rate:  [72-94] 94  (11/07 0545) Resp:  [16-19] 16  (11/07 0545) BP: (101-146)/(58-97) 101/58 mmHg (11/07 0545) SpO2:  [97 %-100 %] 97 % (11/07 0545) Weight:  [174 lb 12.8 oz (79.289 kg)] 174 lb 12.8 oz (79.289 kg) (11/06 1545) Last BM Date: 02/28/12  Intake/Output from previous day: 11/06 0701 - 11/07 0700 In: 1500 [I.V.:1500] Out: 1250 [Urine:600; Emesis/NG output:650] Intake/Output this shift:    General appearance: alert and no distress GI: normal findings: soft, non-tender and non distended  Lab Results:   Basename 03/01/12 0900  WBC 5.5  HGB 9.4*  HCT 27.9*  PLT 486*   BMET  Basename 03/02/12 0420 03/01/12 0900  NA 136 136  K 4.7 3.8  CL 105 101  CO2 26 27  GLUCOSE 121* 75  BUN 11 17  CREATININE 0.85 0.87  CALCIUM 8.9 9.1     Studies/Results: Ct Abdomen Pelvis W Contrast  03/01/2012  *RADIOLOGY REPORT*  Clinical Data: Right lower quadrant pain.  Constipation.  Crohn's disease.  CT ABDOMEN AND PELVIS WITH CONTRAST  Technique:  Multidetector CT imaging of the abdomen and pelvis was performed following the standard protocol during bolus administration of intravenous contrast.  Contrast: 149m OMNIPAQUE IOHEXOL 300 MG/ML  SOLN  Comparison: 09/09/2011.  Findings: Lung bases clear.  Small to moderate volume of ascites is present.  The liver, gallbladder, pancreas, kidneys and adrenal glands appear within normal limits.  Spleen appears within normal limits.  There is dilated terminal ileum extending up to the ileocecal valve.  Hyperenhancement mucosa is present and fecalization of contents. The appendix is not identified.  Phlegmon is present in the right lower  quadrant.  Focal transition point is identified in the right lower quadrant (image number 58 series 2).  There is no pneumatosis.  Maximal diameter of small bowel loops is about 5 cm.  The obstruction appears complete although the stool is present within the colon. Urinary bladder appears normal.  No aggressive osseous lesions. Reactive mesenteric lymph nodes in the right lower quadrant.  IMPRESSION: 1.  Complete small bowel obstruction of the terminal ileum with dilated small bowel measuring up to 5 cm.  The appearance is compatible with stricture of the terminal ileum associated with inflammatory bowel disease.  Exacerbation of Crohn's disease complicating stricture is likely. 2.  Small to moderate volume of ascites.   Original Report Authenticated By: GDereck Ligas M.D.     Anti-infectives: Anti-infectives    None      Assessment/Plan: Crohn's disease with stricture at the terminal ileum and obstruction. Hopefully some component of inflammatory change that will respond to medical management. He is stable but still has bilious NG output and no bowel function yet. Continue current treatment and observation.     LOS: 1 day    Shane Klein T 03/02/2012

## 2012-03-03 MED ORDER — PREDNISONE 20 MG PO TABS: 40.0000 mg | ORAL_TABLET | Freq: Every day | ORAL | Status: DC

## 2012-03-03 MED ORDER — PREDNISONE 20 MG PO TABS
40.0000 mg | ORAL_TABLET | Freq: Every day | ORAL | Status: DC
  Filled 2012-03-03: qty 2

## 2012-03-03 NOTE — Progress Notes (Signed)
General Surgery [facility]  Agree with attached.  Possible discharge later today.  Follow up arranged with GI.  Earnstine Regal, MD, [facility] Office: [phone]

## 2012-03-03 NOTE — Care Management Note (Signed)
    Page 1 of 1   03/03/2012     2:09:00 PM   CARE MANAGEMENT NOTE 03/03/2012  Patient:  DEANDRAE, WAJDA   Account Number:  [id number]  Date Initiated:  03/02/2012  Documentation initiated by:  Lorenda Ishihara  Subjective/Objective Assessment:   21 yo male admitted with SBO related to Crohn's. Patient released from prison one day PTA, from home.     Action/Plan:   Home when stable   Anticipated DC Date:  03/03/2012   Anticipated DC Plan:  HOME/SELF CARE      DC Planning Services  CM consult      Choice offered to / List presented to:             Status of service:  Completed, signed off Medicare Important Message given?   (If response is "NO", the following Medicare IM given date fields will be blank) Date Medicare IM given:   Date Additional Medicare IM given:    Discharge Disposition:  HOME/SELF CARE  Per UR Regulation:  Reviewed for med. necessity/level of care/duration of stay  If discussed at Long Length of Stay Meetings, dates discussed:    Comments:

## 2012-03-03 NOTE — Progress Notes (Addendum)
Notified Dr Carolynne Edouard that patient has pulled out his NGT. Patient denied any pain.  Abdomen soft and nontender on palpation. Patient has had positive flatus.  Dr Carolynne Edouard ordered to leave the NGT out and the assigned doctor will follow-up this morning.

## 2012-03-03 NOTE — Discharge Summary (Signed)
General Surgery [facility]  Agree with attached.  Follow up with GI arranged.  Follow up with surgery as needed.  Earnstine Regal, MD, [facility] Office: [phone]

## 2012-03-03 NOTE — Progress Notes (Signed)
  Subjective: Doing well this am pulled his NG tube out last pm.  No N/V distention this am + BS and flatus.  Objective: Vital signs in last 24 hours: Temp:  [97.6 F (36.4 C)-99.2 F (37.3 C)] 97.6 F (36.4 C) (11/08 0546) Pulse Rate:  [69-76] 72  (11/08 0546) Resp:  [18] 18  (11/08 0546) BP: (104-109)/(55-72) 109/55 mmHg (11/08 0546) SpO2:  [97 %-99 %] 98 % (11/08 0546) Last BM Date: 02/28/12  Intake/Output from previous day: 11/07 0701 - 11/08 0700 In: 3002.1 [I.V.:3002.1] Out: 2000 [Urine:1400; Emesis/NG output:600] Intake/Output this shift:    General appearance: alert, cooperative, appears stated age and no distress Chest: CTA Cardiac: RRR Abdomen: flat non diostended or tender, + BS and Flatus Extremities: warm to touch, no tenderness or edema, + pulses.  Lab Results:   Basename 03/01/12 0900  WBC 5.5  HGB 9.4*  HCT 27.9*  PLT 486*   BMET  Basename 03/02/12 0420 03/01/12 0900  NA 136 136  K 4.7 3.8  CL 105 101  CO2 26 27  GLUCOSE 121* 75  BUN 11 17  CREATININE 0.85 0.87  CALCIUM 8.9 9.1   PT/INR No results found for this basename: LABPROT:2,INR:2 in the last 72 hours ABG No results found for this basename: PHART:2,PCO2:2,PO2:2,HCO3:2 in the last 72 hours  Studies/Results: Ct Abdomen Pelvis W Contrast  03/01/2012  *RADIOLOGY REPORT*  Clinical Data: Right lower quadrant pain.  Constipation.  Crohn's disease.  CT ABDOMEN AND PELVIS WITH CONTRAST  Technique:  Multidetector CT imaging of the abdomen and pelvis was performed following the standard protocol during bolus administration of intravenous contrast.  Contrast: OMNIPAQUE IOHEXOL 300 MG/ML  SOLN  Comparison: 09/09/2011.  Findings: Lung bases clear.  Small to moderate volume of ascites is present.  The liver, gallbladder, pancreas, kidneys and adrenal glands appear within normal limits.  Spleen appears within normal limits.  There is dilated terminal ileum extending up to the ileocecal valve.   Hyperenhancement mucosa is present and fecalization of contents. The appendix is not identified.  Phlegmon is present in the right lower quadrant.  Focal transition point is identified in the right lower quadrant (image number 58 series 2).  There is no pneumatosis.  Maximal diameter of small bowel loops is about 5 cm.  The obstruction appears complete although the stool is present within the colon. Urinary bladder appears normal.  No aggressive osseous lesions. Reactive mesenteric lymph nodes in the right lower quadrant.  IMPRESSION: 1.  Complete small bowel obstruction of the terminal ileum with dilated small bowel measuring up to 5 cm.  The appearance is compatible with stricture of the terminal ileum associated with inflammatory bowel disease.  Exacerbation of Crohn's disease complicating stricture is likely. 2.  Small to moderate volume of ascites.   Original Report Authenticated By: Andreas Newport, M.D.     Anti-infectives: Anti-infectives    None      Assessment/Plan: s/p * No surgery found * Management per GI Per Dr. Ewing Schlein; begin prednisone 40mg  qday x 30 days with f/u with him in 1 weeks time. Advance his diet to clears this am, and if tolerates can be discharged to home self care, with f/u as per above.   LOS: 2 days    Blenda Mounts [facility] Pager [phone] 03/03/2012

## 2012-03-03 NOTE — Progress Notes (Signed)
Shane Klein 8:57 AM  Subjective: Patient is doing better without complaint and is not having any pain and actually moved his bowels and is passing gas  Objective: Vital signs stable afebrile no acute distress not examined today he just got out of the shower no new labs or x-ray  Assessment: Crohn's with terminal ileum stricture  Plan: I discussed the case with the surgical team and the patient and I gave him my card and I will see him back in a week or two and as long as he tolerates clear liquids he can go home soon on oral prednisone and I will slowly wean it as an outpatient and discuss the other medicine options with him but since I expect him to go home later today or tomorrow I do not think he needs to be transferred at this time but will ask my partner to see him tomorrow if still in the hospital  [facility] E

## 2012-03-03 NOTE — Discharge Summary (Signed)
Physician Discharge Summary  Patient ID: Shane Klein MRN: [id number] DOB/AGE: 06-20-1990 21 y.o.  Admit date: 03/01/2012 Discharge date: 03/03/2012  Admission Diagnoses: Abdominal pain and distension   Discharge Diagnoses:    Patient Active Problem List  Diagnosis  . ANEMIA-IRON DEFICIENCY  . DUODENITIS  . CROHN'S DISEASE-LARGE & SMALL INTESTINE  . WEIGHT LOSS-ABNORMAL  . ABDOMINAL PAIN OTHER SPECIFIED SITE  . Crohn's disease with intestinal obstruction  . Celiac disease  . Anemia   Principal Problem:  *Crohn's disease with intestinal obstruction Active Problems:  Celiac disease  Anemia   Discharged Condition: stable  Hospital Course: is a 21 year old [place] American male with a history of Crohn's disease going back to October 2010. He underwent colonoscopy at that time showing Crohn's in the ascending colon around the cecum. The terminal ileum could not be intubated CT and tomography showed disease involving the distal terminal ileum overlying to 15 cm as well as a possible developing stricture. He was eventually discharged on a steroid taper. It appears doubtful that he's been treated since that time. He was released yesterday from jail. He describes abdominal tightness on off every other day, generalized abdominal discomfort for months. 3 weeks ago he was started on sulfasalazine and has been taking it, he has a supply at home he received at discharge from the prison facility yesterday. Since discharge the stomach is become more tender and distended. He's not been able to have a bowel movement. He is having some nausea and presented to the ER. CT scan obtained in the ER shows complete small bowel obstruction of the terminal ileum with dilated small bowel up to 5 cm. This is compatible with a stricture of the terminal ileum associated with inflammatory bowel disease, and Crohn's exacerbation. There is also some moderate ascites. Labs on admission shows his electrolytes are  normal albumin is low. WBC is 5500, H/H= 9.4/27. Because his complete obstruction we were asked to the patient in consultation by the ER physician.  He was admitted to our service, treated with conservative measures: NPO, steroids, IVF, pain management; we also asked Dr. Ewing Schlein to see the patient on consult, and he has assisted in his management during his hospitalization, and will the patient on follow in 1 weeks time upon discharge. The patient removed his NG tube last evening, and has done well since with normal bowel function, tolerating a diet and no distention, N/V. At this time he is seen as stable for discharge, I have spoken to Dr. Ewing Schlein and he is in agreement with discharge and also recommends, prednisone 40 mg daily x 30 days.  He will see the patient in his office on follow up in 1 weeks time. The patient has been given written information concerning his disease process as well as discharge instructions.  He has verbalized understanding of same.    Consults: GI  Significant Diagnostic Studies: labs and radiology.  Treatments: IV hydration, antibiotics, analgesia, anticoagulation and steroids.  Discharge Exam: Blood pressure 126/74, pulse 82, temperature 98.3 F (36.8 C), temperature source Oral, resp. rate 18, height 5\' 11"  (1.803 m), weight 174 lb 12.8 oz (79.289 kg), SpO2 97.00%. General appearance: alert, cooperative, appears stated age and no distress Chest: CTA Cardiac: RRR Abdomen: flat, non tender, + BS,flatus. Tolerating diet with out N/V/D Extremities: w/o tenderness or edema, + Pulses, warm to touch.  Disposition: 01-Home or Self Care  Discharge Orders    Future Orders Please Complete By Expires   Discharge patient  Comments:   Home/self care Patient is to follow up with Dr.Magod in 1 weeks time.  Rx for 40 mg prednisone po daily x 1 month given as per Dr. Ewing Schlein       Medication List     As of 03/03/2012 12:50 PM    TAKE these medications          acetaminophen 500 MG tablet   Commonly known as: TYLENOL   Take 1,000 mg by mouth every 6 (six) hours as needed. pain      docusate sodium 100 MG capsule   Commonly known as: COLACE   Take 100 mg by mouth daily.      predniSONE 20 MG tablet   Commonly known as: DELTASONE   Take 2 tablets (40 mg total) by mouth daily with breakfast.      ranitidine 75 MG tablet   Commonly known as: ZANTAC   Take 75 mg by mouth 2 (two) times daily.           Follow-up Information    Follow up with [facility] E, MD. In 1 week. (call office as needed, or if symptoms worsen)    Contact information:   [address]                         [place] [address] [phone]          Signed: Blenda Mounts ACNP-BC [facility]  03/03/2012, 12:50 PM

## 2012-03-13 ENCOUNTER — Emergency Department (HOSPITAL_COMMUNITY): Payer: Self-pay

## 2012-03-13 ENCOUNTER — Emergency Department (HOSPITAL_COMMUNITY)
Admission: EM | Admit: 2012-03-13 | Discharge: 2012-03-13 | Disposition: A | Discharge status: Home / Self Care | Payer: Self-pay | Attending: Emergency Medicine | Admitting: Emergency Medicine

## 2012-03-13 ENCOUNTER — Encounter (HOSPITAL_COMMUNITY): Payer: Self-pay | Admitting: Emergency Medicine

## 2012-03-13 DIAGNOSIS — Z87891 Personal history of nicotine dependence: Secondary | ICD-10-CM | POA: Insufficient documentation

## 2012-03-13 DIAGNOSIS — Z862 Personal history of diseases of the blood and blood-forming organs and certain disorders involving the immune mechanism: Secondary | ICD-10-CM | POA: Insufficient documentation

## 2012-03-13 DIAGNOSIS — Z8719 Personal history of other diseases of the digestive system: Secondary | ICD-10-CM | POA: Insufficient documentation

## 2012-03-13 DIAGNOSIS — R748 Abnormal levels of other serum enzymes: Secondary | ICD-10-CM | POA: Insufficient documentation

## 2012-03-13 DIAGNOSIS — R609 Edema, unspecified: Secondary | ICD-10-CM | POA: Insufficient documentation

## 2012-03-13 DIAGNOSIS — K5090 Crohn's disease, unspecified, without complications: Secondary | ICD-10-CM | POA: Insufficient documentation

## 2012-03-13 DIAGNOSIS — R7989 Other specified abnormal findings of blood chemistry: Secondary | ICD-10-CM

## 2012-03-13 DIAGNOSIS — Z79899 Other long term (current) drug therapy: Secondary | ICD-10-CM | POA: Insufficient documentation

## 2012-03-13 LAB — URINALYSIS, ROUTINE W REFLEX MICROSCOPIC
Bilirubin Urine: NEGATIVE
Glucose, UA: NEGATIVE mg/dL
Ketones, ur: NEGATIVE mg/dL
Leukocytes, UA: NEGATIVE
Protein, ur: NEGATIVE mg/dL

## 2012-03-13 LAB — CBC WITH DIFFERENTIAL/PLATELET
Basophils Absolute: 0 10*3/uL (ref 0.0–0.1)
Eosinophils Relative: 1 % (ref 0–5)
Lymphocytes Relative: 17 % (ref 12–46)
Lymphs Abs: 1.4 10*3/uL (ref 0.7–4.0)
MCV: 80.4 fL (ref 78.0–100.0)
Neutro Abs: 5.9 10*3/uL (ref 1.7–7.7)
Neutrophils Relative %: 70 % (ref 43–77)
Platelets: 465 10*3/uL — ABNORMAL HIGH (ref 150–400)
RBC: 2.75 MIL/uL — ABNORMAL LOW (ref 4.22–5.81)
RDW: 17.9 % — ABNORMAL HIGH (ref 11.5–15.5)
WBC: 8.5 10*3/uL (ref 4.0–10.5)

## 2012-03-13 LAB — COMPREHENSIVE METABOLIC PANEL
AST: 15 U/L (ref 0–37)
BUN: 15 mg/dL (ref 6–23)
CO2: 28 mEq/L (ref 19–32)
Calcium: 8.6 mg/dL (ref 8.4–10.5)
Chloride: 106 mEq/L (ref 96–112)
Creatinine, Ser: 0.8 mg/dL (ref 0.50–1.35)
GFR calc Af Amer: >90 mL/min (ref >90)
GFR calc non Af Amer: >90 mL/min (ref >90)
Total Bilirubin: 0.2 mg/dL — ABNORMAL LOW (ref 0.3–1.2)

## 2012-03-13 LAB — PRO B NATRIURETIC PEPTIDE: Pro B Natriuretic peptide (BNP): 462.6 pg/mL — ABNORMAL HIGH (ref 0–125)

## 2012-03-13 NOTE — Progress Notes (Signed)
No pcp listed potential medicaid per pt card given on "Friday for family medicine"  CM provided pt with a list of [place] self pay and medicaid providers along with medication and financial assistance resources

## 2012-03-13 NOTE — ED Notes (Signed)
Pt verbalizes understanding 

## 2012-03-13 NOTE — ED Provider Notes (Signed)
Medical screening examination/treatment/procedure(s) were performed by non-physician practitioner and as supervising physician I was immediately available for consultation/collaboration.    Nelia Shi, MD 03/13/12 2011

## 2012-03-13 NOTE — ED Provider Notes (Signed)
History     CSN: [id number]  Arrival date & time 03/13/12  1722   First MD Initiated Contact with Patient 03/13/12 1806      Chief Complaint  Patient presents with  . swelling to legs    (Consider location/radiation/quality/duration/timing/severity/associated sxs/prior treatment) HPI  Pt to the ER with complaints of swelling to bilateral leg edema. He has a PMH of celiac disease and Crohns disease. He denies having pains in his legs, CP, SOB or hx of heart disease. He noticed the symptoms starting 1 week ago when his shoes continued to get tighter while at work. He denies this every happening before. Pt does not have PCP but does have GI doctor. nad vss, BP 138/78   Past Medical History  Diagnosis Date  . Crohn's disease   . Crohn disease   . 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. Celiac disease   . Crohn's disease with intestinal obstruction 03/01/2012  . Celiac disease 03/01/2012  . Anemia 03/01/2012    History reviewed. No pertinent past surgical history.  No family history on file.  History  Substance Use Topics  . Smoking status: Former Smoker    Types: Cigarettes    Quit date: 03/01/2010  . Smokeless tobacco: Never Used  . Alcohol Use: Yes     Comment: rarely      Review of Systems  Review of Systems  Gen: no weight loss, fevers, chills, night sweats  Neck: no neck pain  Lungs:No wheezing, coughing or hemoptysis CV: no chest pain, palpitations, dependent edema or orthopnea  Abd: no abdominal pain, nausea, vomiting  GU: no dysuria or gross hematuria  MSK:  Bilateral lower extremity swelling Neuro: no headache, no focal neurologic deficits  Skin: no abnormalities Psyche: negative.   Allergies  Wheat bran  Home Medications   Current Outpatient Rx  Name  Route  Sig  Dispense  Refill  . ACETAMINOPHEN 500 MG PO TABS   Oral   Take 1,000 mg by mouth every 6 (six) hours as needed. pain          . DOCUSATE SODIUM 100 MG PO CAPS   Oral   Take 100 mg by mouth daily.           Marland Kitchen PREDNISONE 20 MG PO TABS   Oral   Take 40 mg by mouth daily with breakfast.         . RANITIDINE HCL 75 MG PO TABS   Oral   Take 75 mg by mouth 2 (two) times daily.           BP 141/86  Pulse 97  Temp 98.4 F (36.9 C) (Oral)  Resp 19  SpO2 100%  Physical Exam  Nursing note and vitals reviewed. Constitutional: He appears well-developed and well-nourished. No distress.  HENT:  Head: Normocephalic and atraumatic.  Eyes: Pupils are equal, round, and reactive to light.  Neck: Normal range of motion. Neck supple.  Cardiovascular: Normal rate and regular rhythm.   Pulmonary/Chest: Effort normal.  Abdominal: Soft.  Neurological: He is alert.  Skin: Skin is warm and dry.       ED Course  Procedures (including critical care time)  Labs Reviewed  URINALYSIS, ROUTINE W REFLEX MICROSCOPIC - Abnormal; Notable for the following:    APPearance CLOUDY (*)     All other components within normal limits  COMPREHENSIVE METABOLIC PANEL - Abnormal; Notable for the following:    Total Protein 5.9 (*)     Albumin 2.7 (*)  Total Bilirubin 0.2 (*)     All other components within normal limits  PRO B NATRIURETIC PEPTIDE - Abnormal; Notable for the following:    Pro B Natriuretic peptide (BNP) 462.6 (*)     All other components within normal limits  CBC WITH DIFFERENTIAL   Dg Chest 2 View  03/13/2012  *RADIOLOGY REPORT*  Clinical Data: Lower extremity swelling  CHEST - 2 VIEW  Comparison: 10/15/2010  Findings: Lungs clear.  Heart size and pulmonary vascularity normal.  No effusion.  Visualized bones unremarkable.  IMPRESSION: No acute disease   Original Report Authenticated By: D. Andria Rhein, MD      1. Elevated brain natriuretic peptide (BNP) level   2. Peripheral edema       MDM  Pt discussed with Dr. Radford Pax  BNP is 462, chest xray normal, urinalysis and cbc all normal. With the lower extremity swelling and elevated BNP we are concerned about right sided heart  failure. Will call cardiology to have a cardiac echo test scheduled as an outpatient within the next 24-48 hours.    Date: 03/13/2012  Rate: 97  Rhythm: normal sinus rhythm  QRS Axis: normal  Intervals: normal  ST/T Wave abnormalities: normal  Conduction Disutrbances:none  Narrative Interpretation:   Old EKG Reviewed: unchanged  Spoke with Dr. Rennis Golden from [facility]. Pt is to call office and schedule follow-up appointment to evaluate right side of heart. They will see him this week.  Pt has been advised of the symptoms that warrant their return to the ED. Patient has voiced understanding and has agreed to follow-up with the PCP or specialist.      Dorthula Matas, PA 03/13/12 2000

## 2012-03-13 NOTE — ED Notes (Signed)
Pt presenting to ed with c/o swelling to bilateral legs and feet x 1 week. Pt states he has been able to ambulate without any problems but his legs feel tight and heavy. Pt denies pain at this time

## 2012-04-04 ENCOUNTER — Emergency Department (HOSPITAL_COMMUNITY)
Admission: EM | Admit: 2012-04-04 | Discharge: 2012-04-04 | Disposition: A | Discharge status: Home / Self Care | Payer: Self-pay | Attending: Emergency Medicine | Admitting: Emergency Medicine

## 2012-04-04 ENCOUNTER — Encounter (HOSPITAL_COMMUNITY): Payer: Self-pay | Admitting: *Deleted

## 2012-04-04 ENCOUNTER — Emergency Department (HOSPITAL_COMMUNITY): Payer: Self-pay

## 2012-04-04 DIAGNOSIS — N390 Urinary tract infection, site not specified: Secondary | ICD-10-CM | POA: Insufficient documentation

## 2012-04-04 DIAGNOSIS — Z862 Personal history of diseases of the blood and blood-forming organs and certain disorders involving the immune mechanism: Secondary | ICD-10-CM | POA: Insufficient documentation

## 2012-04-04 DIAGNOSIS — IMO0002 Reserved for concepts with insufficient information to code with codable children: Secondary | ICD-10-CM | POA: Insufficient documentation

## 2012-04-04 DIAGNOSIS — Z87891 Personal history of nicotine dependence: Secondary | ICD-10-CM | POA: Insufficient documentation

## 2012-04-04 DIAGNOSIS — Z79899 Other long term (current) drug therapy: Secondary | ICD-10-CM | POA: Insufficient documentation

## 2012-04-04 DIAGNOSIS — Z8719 Personal history of other diseases of the digestive system: Secondary | ICD-10-CM | POA: Insufficient documentation

## 2012-04-04 DIAGNOSIS — R109 Unspecified abdominal pain: Secondary | ICD-10-CM

## 2012-04-04 LAB — CBC WITH DIFFERENTIAL/PLATELET
Basophils Absolute: 0 10*3/uL (ref 0.0–0.1)
Eosinophils Relative: 3 % (ref 0–5)
HCT: 23.9 % — ABNORMAL LOW (ref 39.0–52.0)
Hemoglobin: 7.6 g/dL — ABNORMAL LOW (ref 13.0–17.0)
Lymphocytes Relative: 17 % (ref 12–46)
Lymphs Abs: 1.2 10*3/uL (ref 0.7–4.0)
MCV: 74.2 fL — ABNORMAL LOW (ref 78.0–100.0)
Monocytes Absolute: 1.3 10*3/uL — ABNORMAL HIGH (ref 0.1–1.0)
Neutro Abs: 4.1 10*3/uL (ref 1.7–7.7)
RBC: 3.22 MIL/uL — ABNORMAL LOW (ref 4.22–5.81)
RDW: 17.2 % — ABNORMAL HIGH (ref 11.5–15.5)
WBC: 6.8 10*3/uL (ref 4.0–10.5)

## 2012-04-04 LAB — URINALYSIS, ROUTINE W REFLEX MICROSCOPIC
Glucose, UA: NEGATIVE mg/dL
Hgb urine dipstick: NEGATIVE
Ketones, ur: NEGATIVE mg/dL
Protein, ur: NEGATIVE mg/dL
Urobilinogen, UA: 1 mg/dL (ref 0.0–1.0)

## 2012-04-04 LAB — COMPREHENSIVE METABOLIC PANEL
ALT: 23 U/L (ref 0–53)
AST: 22 U/L (ref 0–37)
CO2: 27 mEq/L (ref 19–32)
Chloride: 102 mEq/L (ref 96–112)
Creatinine, Ser: 0.91 mg/dL (ref 0.50–1.35)
GFR calc Af Amer: >90 mL/min (ref >90)
GFR calc non Af Amer: >90 mL/min (ref >90)
Glucose, Bld: 88 mg/dL (ref 70–99)
Sodium: 136 mEq/L (ref 135–145)
Total Bilirubin: 0.2 mg/dL — ABNORMAL LOW (ref 0.3–1.2)

## 2012-04-04 MED ORDER — CIPROFLOXACIN HCL 500 MG PO TABS: 500.0000 mg | ORAL_TABLET | Freq: Two times a day (BID) | ORAL | Status: DC

## 2012-04-04 MED ORDER — PROMETHAZINE HCL 25 MG PO TABS: 25.0000 mg | ORAL_TABLET | Freq: Four times a day (QID) | ORAL | Status: DC | PRN

## 2012-04-04 MED ORDER — HYDROMORPHONE HCL PF 1 MG/ML IJ SOLN
1.0000 mg | Freq: Once | INTRAMUSCULAR | Status: AC
  Administered 2012-04-04: 1 mg via INTRAVENOUS
  Filled 2012-04-04: qty 1

## 2012-04-04 MED ORDER — OXYCODONE-ACETAMINOPHEN 5-325 MG PO TABS
1.0000 | ORAL_TABLET | Freq: Four times a day (QID) | ORAL | Status: DC | PRN
Start: 2012-04-04 — End: 2012-06-22

## 2012-04-04 MED ORDER — ONDANSETRON HCL 4 MG/2ML IJ SOLN
4.0000 mg | Freq: Once | INTRAMUSCULAR | Status: AC
  Administered 2012-04-04: 4 mg via INTRAVENOUS
  Filled 2012-04-04: qty 2

## 2012-04-04 MED ORDER — OXYCODONE-ACETAMINOPHEN 5-325 MG PO TABS: 1.0000 | ORAL_TABLET | Freq: Four times a day (QID) | ORAL | Status: DC | PRN

## 2012-04-04 MED ORDER — DEXTROSE 5 % IV SOLN
1.0000 g | Freq: Once | INTRAVENOUS | Status: AC
  Administered 2012-04-04: 1 g via INTRAVENOUS
  Filled 2012-04-04: qty 10

## 2012-04-04 MED ORDER — SODIUM CHLORIDE 0.9 % IV BOLUS (SEPSIS)
1000.0000 mL | Freq: Once | INTRAVENOUS | Status: AC
  Administered 2012-04-04: 1000 mL via INTRAVENOUS

## 2012-04-04 NOTE — ED Notes (Signed)
Pt to xray

## 2012-04-04 NOTE — ED Notes (Signed)
Pt arrives by [facility] reports n/v/d and abd pain since Saturday. Reports hx of Crohns.

## 2012-04-04 NOTE — ED Notes (Signed)
Pt alert and oriented x4. Respirations even and unlabored, bilateral symmetrical rise and fall of chest. Skin warm and dry. In no acute distress. Denies needs.   

## 2012-04-04 NOTE — ED Provider Notes (Signed)
History     CSN: [id number]  Arrival date & time 04/04/12  1330   First MD Initiated Contact with Patient 04/04/12 1645      Chief Complaint  Patient presents with  . Abdominal Pain    (Consider location/radiation/quality/duration/timing/severity/associated sxs/prior treatment) HPI..... left-sided abdominal pain since Saturday. Patient has a history of Crohn's and celiac disease. He has had multiple CT scans in the past. Decreased oral intake. Nothing makes symptoms better or worse. Severity is mild to moderate. No radiation. Pain described as sharp Past Medical History  Diagnosis Date  . Crohn's disease   . Crohn disease   . 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. Celiac disease   . Crohn's disease with intestinal obstruction 03/01/2012  . Celiac disease 03/01/2012  . Anemia 03/01/2012    History reviewed. No pertinent past surgical history.  History reviewed. No pertinent family history.  History  Substance Use Topics  . Smoking status: Former Smoker    Types: Cigarettes    Quit date: 03/01/2010  . Smokeless tobacco: Never Used  . Alcohol Use: Yes     Comment: rarely      Review of Systems  All other systems reviewed and are negative.    Allergies  Wheat bran  Home Medications   Current Outpatient Rx  Name  Route  Sig  Dispense  Refill  . DOCUSATE SODIUM 100 MG PO CAPS   Oral   Take 100 mg by mouth daily.         Marland Kitchen PREDNISONE 20 MG PO TABS   Oral   Take 40 mg by mouth daily with breakfast.         . CIPROFLOXACIN HCL 500 MG PO TABS   Oral   Take 1 tablet (500 mg total) by mouth 2 (two) times daily. One po bid x 7 days   14 tablet   0   . OXYCODONE-ACETAMINOPHEN 5-325 MG PO TABS   Oral   Take 1-2 tablets by mouth every 6 (six) hours as needed for pain.   20 tablet   0   . PROMETHAZINE HCL 25 MG PO TABS   Oral   Take 1 tablet (25 mg total) by mouth every 6 (six) hours as needed for nausea.   20 tablet   0     BP 105/55  Pulse 81  Temp 98.4 F (36.9 C)   Resp 16  SpO2 99%  Physical Exam  Nursing note and vitals reviewed. Constitutional: He is oriented to person, place, and time. He appears well-developed and well-nourished.  HENT:  Head: Normocephalic and atraumatic.  Eyes: Conjunctivae normal and EOM are normal. Pupils are equal, round, and reactive to light.  Neck: Normal range of motion. Neck supple.  Cardiovascular: Normal rate, regular rhythm and normal heart sounds.   Pulmonary/Chest: Effort normal and breath sounds normal.  Abdominal: Soft. Bowel sounds are normal.       Tender to palpation left side of the abdomen  Musculoskeletal: Normal range of motion.  Neurological: He is alert and oriented to person, place, and time.  Skin: Skin is warm and dry.  Psychiatric: He has a normal mood and affect.    ED Course  Procedures (including critical care time)  Labs Reviewed  CBC WITH DIFFERENTIAL - Abnormal; Notable for the following:    RBC 3.22 (*)     Hemoglobin 7.6 (*)     HCT 23.9 (*)     MCV 74.2 (*)     MCH 23.6 (*)  RDW 17.2 (*)     Platelets 732 (*)     Monocytes Relative 19 (*)     Monocytes Absolute 1.3 (*)     All other components within normal limits  COMPREHENSIVE METABOLIC PANEL - Abnormal; Notable for the following:    Albumin 2.9 (*)     Total Bilirubin 0.2 (*)     All other components within normal limits  URINALYSIS, ROUTINE W REFLEX MICROSCOPIC - Abnormal; Notable for the following:    Color, Urine AMBER (*)  BIOCHEMICALS MAY BE AFFECTED BY COLOR   Bilirubin Urine SMALL (*)     Leukocytes, UA SMALL (*)     All other components within normal limits  URINE MICROSCOPIC-ADD ON - Abnormal; Notable for the following:    Squamous Epithelial / LPF FEW (*)     Bacteria, UA FEW (*)     All other components within normal limits  LIPASE, BLOOD  URINE CULTURE   Dg Abd Acute W/chest  04/04/2012  *RADIOLOGY REPORT*  Clinical Data: Abdominal pain, nausea/vomiting/diarrhea  ACUTE ABDOMEN SERIES (ABDOMEN 2  VIEW & CHEST 1 VIEW)  Comparison: Chest radiographs dated 03/13/2012.  CT abdomen pelvis dated 03/01/2012.  Findings: Lungs are essentially clear.  No focal consolidation. No pleural effusion or pneumothorax.  Cardiomediastinal silhouette is within normal limits.  Nonobstructive bowel gas pattern without disproportionate small bowel dilatation to suggest small bowel obstruction.  Multiple air fluid levels on the upright radiograph raise the possibility of small bowel enteritis.  No evidence of free air under the diaphragm on the upright view.  Visualized osseous structures are within normal limits.  IMPRESSION: No evidence of acute cardiopulmonary disease.  No evidence of small bowel obstruction or free air.  Possible small bowel enteritis.   Original Report Authenticated By: Charline Bills, M.D.      1. Abdominal pain   2. Urinary tract infection       MDM  No acute abdomen. No evidence of small bowel obstruction.  Urinalysis shows evidence of infection. IV Rocephin given. Urine culture. Discharge  on Cipro for her milligrams twice a day for one week, Phenergan 25 mg #20 and Percocet #20. I tried to avoid CT scan tonight secondary to multiple scans in the past        Donnetta Hutching, MD 04/04/12 2201

## 2012-04-05 LAB — URINE CULTURE
Colony Count: NO GROWTH
Culture: NO GROWTH

## 2012-06-22 ENCOUNTER — Encounter (HOSPITAL_COMMUNITY): Payer: Self-pay

## 2012-06-22 ENCOUNTER — Emergency Department (HOSPITAL_COMMUNITY)
Admission: EM | Admit: 2012-06-22 | Discharge: 2012-06-22 | Disposition: A | Discharge status: Home / Self Care | Payer: Self-pay | Attending: Emergency Medicine | Admitting: Emergency Medicine

## 2012-06-22 ENCOUNTER — Emergency Department (HOSPITAL_COMMUNITY): Payer: Self-pay

## 2012-06-22 DIAGNOSIS — K5090 Crohn's disease, unspecified, without complications: Secondary | ICD-10-CM | POA: Insufficient documentation

## 2012-06-22 DIAGNOSIS — Z8709 Personal history of other diseases of the respiratory system: Secondary | ICD-10-CM | POA: Insufficient documentation

## 2012-06-22 DIAGNOSIS — Z87891 Personal history of nicotine dependence: Secondary | ICD-10-CM | POA: Insufficient documentation

## 2012-06-22 DIAGNOSIS — K5010 Crohn's disease of large intestine without complications: Secondary | ICD-10-CM

## 2012-06-22 DIAGNOSIS — D649 Anemia, unspecified: Secondary | ICD-10-CM | POA: Insufficient documentation

## 2012-06-22 DIAGNOSIS — Z862 Personal history of diseases of the blood and blood-forming organs and certain disorders involving the immune mechanism: Secondary | ICD-10-CM | POA: Insufficient documentation

## 2012-06-22 DIAGNOSIS — R197 Diarrhea, unspecified: Secondary | ICD-10-CM | POA: Insufficient documentation

## 2012-06-22 DIAGNOSIS — Z8719 Personal history of other diseases of the digestive system: Secondary | ICD-10-CM | POA: Insufficient documentation

## 2012-06-22 LAB — URINE MICROSCOPIC-ADD ON

## 2012-06-22 LAB — CBC WITH DIFFERENTIAL/PLATELET
Basophils Absolute: 0.1 10*3/uL (ref 0.0–0.1)
Basophils Relative: 1 % (ref 0–1)
Eosinophils Absolute: 0.2 10*3/uL (ref 0.0–0.7)
Eosinophils Relative: 4 % (ref 0–5)
HCT: 24.4 % — ABNORMAL LOW (ref 39.0–52.0)
Hemoglobin: 7.2 g/dL — ABNORMAL LOW (ref 13.0–17.0)
Lymphocytes Relative: 25 % (ref 12–46)
Lymphs Abs: 1.4 10*3/uL (ref 0.7–4.0)
MCH: 18.7 pg — ABNORMAL LOW (ref 26.0–34.0)
MCHC: 29.5 g/dL — ABNORMAL LOW (ref 30.0–36.0)
MCV: 63.4 fL — ABNORMAL LOW (ref 78.0–100.0)
Monocytes Absolute: 0.6 10*3/uL (ref 0.1–1.0)
Monocytes Relative: 11 % (ref 3–12)
Neutro Abs: 3.4 10*3/uL (ref 1.7–7.7)
Neutrophils Relative %: 59 % (ref 43–77)
Platelets: 494 10*3/uL — ABNORMAL HIGH (ref 150–400)
RBC: 3.85 MIL/uL — ABNORMAL LOW (ref 4.22–5.81)
RDW: 18.1 % — ABNORMAL HIGH (ref 11.5–15.5)
WBC: 5.7 10*3/uL (ref 4.0–10.5)

## 2012-06-22 LAB — COMPREHENSIVE METABOLIC PANEL
ALT: 6 U/L (ref 0–53)
AST: 13 U/L (ref 0–37)
Albumin: 3.1 g/dL — ABNORMAL LOW (ref 3.5–5.2)
Alkaline Phosphatase: 53 U/L (ref 39–117)
BUN: 12 mg/dL (ref 6–23)
CO2: 27 mEq/L (ref 19–32)
Calcium: 9.2 mg/dL (ref 8.4–10.5)
Chloride: 103 mEq/L (ref 96–112)
Creatinine, Ser: 0.73 mg/dL (ref 0.50–1.35)
GFR calc Af Amer: >90 mL/min (ref >90)
GFR calc non Af Amer: >90 mL/min (ref >90)
Glucose, Bld: 85 mg/dL (ref 70–99)
Potassium: 4 mEq/L (ref 3.5–5.1)
Sodium: 137 mEq/L (ref 135–145)
Total Bilirubin: 0.1 mg/dL — ABNORMAL LOW (ref 0.3–1.2)
Total Protein: 6.8 g/dL (ref 6.0–8.3)

## 2012-06-22 LAB — URINALYSIS, ROUTINE W REFLEX MICROSCOPIC
Bilirubin Urine: NEGATIVE
Glucose, UA: NEGATIVE mg/dL
Hgb urine dipstick: NEGATIVE
Ketones, ur: NEGATIVE mg/dL
Nitrite: NEGATIVE
Protein, ur: NEGATIVE mg/dL
Specific Gravity, Urine: 1.022 (ref 1.005–1.030)
Urobilinogen, UA: 1 mg/dL (ref 0.0–1.0)
pH: 7 (ref 5.0–8.0)

## 2012-06-22 MED ORDER — HYDROMORPHONE HCL PF 1 MG/ML IJ SOLN
1.0000 mg | Freq: Once | INTRAMUSCULAR | Status: AC
  Administered 2012-06-22: 1 mg via INTRAVENOUS
  Filled 2012-06-22: qty 1

## 2012-06-22 MED ORDER — ONDANSETRON HCL 4 MG/2ML IJ SOLN
4.0000 mg | Freq: Once | INTRAMUSCULAR | Status: AC
  Administered 2012-06-22: 4 mg via INTRAVENOUS
  Filled 2012-06-22: qty 2

## 2012-06-22 MED ORDER — IOHEXOL 300 MG/ML  SOLN
100.0000 mL | Freq: Once | INTRAMUSCULAR | Status: AC | PRN
  Administered 2012-06-22: 100 mL via INTRAVENOUS

## 2012-06-22 MED ORDER — SODIUM CHLORIDE 0.9 % IV BOLUS (SEPSIS)
1000.0000 mL | Freq: Once | INTRAVENOUS | Status: AC
  Administered 2012-06-22: 1000 mL via INTRAVENOUS

## 2012-06-22 MED ORDER — METRONIDAZOLE 500 MG PO TABS: 500.0000 mg | ORAL_TABLET | Freq: Three times a day (TID) | ORAL | Status: DC

## 2012-06-22 MED ORDER — PREDNISONE 20 MG PO TABS: ORAL_TABLET | ORAL | Status: DC

## 2012-06-22 MED ORDER — IOHEXOL 300 MG/ML  SOLN
50.0000 mL | Freq: Once | INTRAMUSCULAR | Status: AC | PRN
  Administered 2012-06-22: 50 mL via ORAL

## 2012-06-22 MED ORDER — OXYCODONE-ACETAMINOPHEN 5-325 MG PO TABS: 1.0000 | ORAL_TABLET | ORAL | Status: DC | PRN

## 2012-06-22 MED ORDER — CIPROFLOXACIN HCL 500 MG PO TABS: 500.0000 mg | ORAL_TABLET | Freq: Two times a day (BID) | ORAL | Status: DC

## 2012-06-22 NOTE — Progress Notes (Signed)
During [facility] ED 06/22/12 visit pt was seen by [facility] liaison  Pt offered services to assist with finding a [place] self pay provider, resources & health reform information

## 2012-06-22 NOTE — ED Provider Notes (Signed)
History     CSN: [id number]  Arrival date & time 06/22/12  1029   First MD Initiated Contact with Patient 06/22/12 1235      Chief Complaint  Patient presents with  . Abdominal Pain  . Diarrhea    (Consider location/radiation/quality/duration/timing/severity/associated sxs/prior treatment) HPI Patient is a 22 yo with a history of celiac and crohn's disease presents today with a 5 day history of abdominal pain and diarrhea.  The abdominal pain is described as crampy/sharp pain located at the left lower quadrant that comes and goes.  He has tried tylenol but that hasn't helped.  He also complains of diarrhea without an blood.  Denies any fever, shortness of breath, nausea or vomiting.  He says that this is similar to his previous crohn's flare ups and the last one he had was 7 months ago.  Past Medical History  Diagnosis Date  . Crohn's disease   . Crohn disease   . 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. Celiac disease   . Crohn's disease with intestinal obstruction 03/01/2012  . Celiac disease 03/01/2012  . Anemia 03/01/2012    History reviewed. No pertinent past surgical history.  History reviewed. No pertinent family history.  History  Substance Use Topics  . Smoking status: Former Smoker    Types: Cigarettes    Quit date: 03/01/2010  . Smokeless tobacco: Never Used  . Alcohol Use: Yes     Comment: rarely      Review of Systems All other systems negative except as documented in the HPI. All pertinent positives and negatives as reviewed in the HPI.  Allergies  Wheat bran  Home Medications   Current Outpatient Rx  Name  Route  Sig  Dispense  Refill  . ibuprofen (ADVIL,MOTRIN) 200 MG tablet   Oral   Take 400 mg by mouth every 8 (eight) hours as needed for pain.           BP 118/68  Pulse 66  Temp(Src) 97.7 F (36.5 C) (Oral)  Resp 20  SpO2 100%  Physical Exam  Nursing note and vitals reviewed. Constitutional: He is oriented to person, place, and time. He appears  well-developed and well-nourished. No distress.  HENT:  Head: Normocephalic and atraumatic.  Eyes: Right eye exhibits no discharge. Left eye exhibits no discharge. No scleral icterus.  Neck: Normal range of motion.  Cardiovascular: Normal rate, regular rhythm and normal heart sounds.  Exam reveals no gallop and no friction rub.   No murmur heard. Pulmonary/Chest: Effort normal and breath sounds normal. No respiratory distress. He has no wheezes. He has no rales. He exhibits no tenderness.  Abdominal: Soft. Bowel sounds are normal. There is tenderness in the left lower quadrant.    Neurological: He is alert and oriented to person, place, and time.  Skin: Skin is warm and dry. No rash noted. He is not diaphoretic. No erythema. No pallor.  Psychiatric: He has a normal mood and affect. His behavior is normal. Judgment and thought content normal.    ED Course  Procedures (including critical care time)  Labs Reviewed  CBC WITH DIFFERENTIAL  COMPREHENSIVE METABOLIC PANEL  URINALYSIS, ROUTINE W REFLEX MICROSCOPIC    2:45pm patient continues to complain of pain. Will start on pain meds and check back in to see response   The patient will be seen by GI. I spoke with Dr. Juanda Chance and she states that the patient had been released from there practice. She states that they will see him tomorrow at  2 pm. Patient is stable and feeling better at discharge.  MDM   MDM Reviewed: nursing note and vitals Interpretation: labs and CT scan           Carlyle Dolly, PA-C 06/22/12 2020

## 2012-06-22 NOTE — ED Notes (Addendum)
Patient c/o left lower abdominal pain and diarrhea that began 5 days ago. Patient states that he has had 8 diarrheal stools in the past 24 hours. Patient denies any blood in his stool or N/V. Patient has a history of Crohn's and Celiac disease.

## 2012-06-23 ENCOUNTER — Ambulatory Visit: Payer: Self-pay | Admitting: Nurse Practitioner

## 2012-06-23 NOTE — ED Provider Notes (Signed)
Medical screening examination/treatment/procedure(s) were performed by non-physician practitioner and as supervising physician I was immediately available for consultation/collaboration.  Flint Melter, MD 06/23/12 [phone]

## 2012-06-24 LAB — URINE CULTURE: Colony Count: NO GROWTH

## 2012-10-02 ENCOUNTER — Emergency Department (HOSPITAL_COMMUNITY)
Admission: EM | Admit: 2012-10-02 | Discharge: 2012-10-02 | Disposition: A | Discharge status: Home / Self Care | Payer: Medicaid Other | Attending: Emergency Medicine | Admitting: Emergency Medicine

## 2012-10-02 ENCOUNTER — Encounter (HOSPITAL_COMMUNITY): Payer: Self-pay | Admitting: Emergency Medicine

## 2012-10-02 ENCOUNTER — Emergency Department (HOSPITAL_COMMUNITY): Payer: Medicaid Other

## 2012-10-02 DIAGNOSIS — Z79899 Other long term (current) drug therapy: Secondary | ICD-10-CM | POA: Insufficient documentation

## 2012-10-02 DIAGNOSIS — Z862 Personal history of diseases of the blood and blood-forming organs and certain disorders involving the immune mechanism: Secondary | ICD-10-CM | POA: Insufficient documentation

## 2012-10-02 DIAGNOSIS — K900 Celiac disease: Secondary | ICD-10-CM | POA: Insufficient documentation

## 2012-10-02 DIAGNOSIS — R197 Diarrhea, unspecified: Secondary | ICD-10-CM | POA: Insufficient documentation

## 2012-10-02 DIAGNOSIS — Z8709 Personal history of other diseases of the respiratory system: Secondary | ICD-10-CM | POA: Insufficient documentation

## 2012-10-02 DIAGNOSIS — Z87891 Personal history of nicotine dependence: Secondary | ICD-10-CM | POA: Insufficient documentation

## 2012-10-02 DIAGNOSIS — Z8719 Personal history of other diseases of the digestive system: Secondary | ICD-10-CM | POA: Insufficient documentation

## 2012-10-02 DIAGNOSIS — R51 Headache: Secondary | ICD-10-CM | POA: Insufficient documentation

## 2012-10-02 DIAGNOSIS — K5090 Crohn's disease, unspecified, without complications: Secondary | ICD-10-CM | POA: Insufficient documentation

## 2012-10-02 DIAGNOSIS — R109 Unspecified abdominal pain: Secondary | ICD-10-CM

## 2012-10-02 LAB — CBC WITH DIFFERENTIAL/PLATELET
Basophils Absolute: 0.1 10*3/uL (ref 0.0–0.1)
Eosinophils Relative: 15 % — ABNORMAL HIGH (ref 0–5)
Lymphs Abs: 1 10*3/uL (ref 0.7–4.0)
MCH: 18.6 pg — ABNORMAL LOW (ref 26.0–34.0)
MCV: 60.8 fL — ABNORMAL LOW (ref 78.0–100.0)
Monocytes Absolute: 0.7 10*3/uL (ref 0.1–1.0)
Neutrophils Relative %: 57 % (ref 43–77)
Platelets: 582 10*3/uL — ABNORMAL HIGH (ref 150–400)
RBC: 3.98 MIL/uL — ABNORMAL LOW (ref 4.22–5.81)
RDW: 17.7 % — ABNORMAL HIGH (ref 11.5–15.5)
WBC: 6.3 10*3/uL (ref 4.0–10.5)

## 2012-10-02 LAB — COMPREHENSIVE METABOLIC PANEL
AST: 20 U/L (ref 0–37)
Albumin: 2.7 g/dL — ABNORMAL LOW (ref 3.5–5.2)
BUN: 14 mg/dL (ref 6–23)
Calcium: 9 mg/dL (ref 8.4–10.5)
Chloride: 107 mEq/L (ref 96–112)
Creatinine, Ser: 0.83 mg/dL (ref 0.50–1.35)
Total Bilirubin: 0.1 mg/dL — ABNORMAL LOW (ref 0.3–1.2)
Total Protein: 6.6 g/dL (ref 6.0–8.3)

## 2012-10-02 LAB — LIPASE, BLOOD: Lipase: 17 U/L (ref 11–59)

## 2012-10-02 LAB — OCCULT BLOOD, POC DEVICE: Fecal Occult Bld: NEGATIVE

## 2012-10-02 MED ORDER — HYDROMORPHONE HCL PF 1 MG/ML IJ SOLN
1.0000 mg | Freq: Once | INTRAMUSCULAR | Status: AC
  Administered 2012-10-02: 1 mg via INTRAVENOUS
  Filled 2012-10-02: qty 1

## 2012-10-02 MED ORDER — MESALAMINE 400 MG PO TBEC: 400.0000 mg | DELAYED_RELEASE_TABLET | Freq: Three times a day (TID) | ORAL | Status: DC

## 2012-10-02 MED ORDER — ONDANSETRON HCL 4 MG PO TABS: 4.0000 mg | ORAL_TABLET | Freq: Four times a day (QID) | ORAL | Status: DC

## 2012-10-02 MED ORDER — SODIUM CHLORIDE 0.9 % IV BOLUS (SEPSIS)
1000.0000 mL | Freq: Once | INTRAVENOUS | Status: AC
  Administered 2012-10-02: 1000 mL via INTRAVENOUS

## 2012-10-02 NOTE — Progress Notes (Signed)
[facility] has seen patient and provided him with a list of primary care resources.

## 2012-10-02 NOTE — ED Notes (Signed)
Pt presenting to ed with c/o abdominal pain with diarrhea pt state's he's having a crohn's flare up pt denies nausea and vomiting at this time

## 2012-10-02 NOTE — ED Provider Notes (Signed)
History     CSN: [id number]  Arrival date & time 10/02/12  1057   First MD Initiated Contact with Patient 10/02/12 1125      Chief Complaint  Patient presents with  . Abdominal Pain    (Consider location/radiation/quality/duration/timing/severity/associated sxs/prior treatment) The history is provided by the patient. No language interpreter was used.  Shane Klein is a 22 y/o M with PMHx of celiac disease and Crohn's disease presenting to the ED with abdominal pain x 2-3 days ago, described as a cramping sensation that occurs every 10 minutes to the lower abdomen with a constant tightness sensation to the abdomen in general, no radiaiton, has been using Ibuprofen for discomfort without relief. Patient stated that the pain came on immediately eating pizza that was supposed to be gluten-free. Patient stated that he has been having episodes of diarrhea - at least 6 episodes within the past 2-3 days - denied blood and mucus. Stated that he has been able to eat. Reported that he has been having a headache that started today - described as a throbbing sensation to the left side of his head. Denied nausea, vomiting, melena, hematochezia, photophobia, phonophobia, visual changes.   PCP: None Patient used to follow Dr. Russella Dar, but has not due to no insurance. Patient reported that he is supposed to be on medications to control the Crohn's, but cannot afford the medication without insurance.   Past Medical History  Diagnosis Date  . 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. Celiac disease   . Crohn's disease with intestinal obstruction 03/01/2012  . Iron deficiency anemia   . GERD (gastroesophageal reflux disease)     History reviewed. No pertinent past surgical history.  Family History  Problem Relation Age of Onset  . Diabetes Maternal Aunt   . Diabetes Maternal Grandmother     History  Substance Use Topics  . Smoking status: Former Smoker    Types: Cigarettes    Quit date: 03/01/2010  . Smokeless tobacco:  Never Used  . Alcohol Use: No     Comment: rarely      Review of Systems  Constitutional: Negative for fever, chills and fatigue.  HENT: Negative for congestion, sore throat, trouble swallowing, neck pain and neck stiffness.   Eyes: Negative for photophobia, pain and visual disturbance.  Respiratory: Negative for cough, chest tightness and shortness of breath.   Cardiovascular: Negative for chest pain.  Gastrointestinal: Positive for abdominal pain and diarrhea. Negative for nausea, vomiting, constipation, blood in stool and anal bleeding.  Genitourinary: Negative for dysuria, urgency, decreased urine volume and difficulty urinating.  Musculoskeletal: Negative for back pain and arthralgias.  Skin: Negative for rash.  Neurological: Positive for headaches. Negative for dizziness, seizures, weakness, light-headedness and numbness.  All other systems reviewed and are negative.    Allergies  Gluten meal and Wheat bran  Home Medications   Current Outpatient Rx  Name  Route  Sig  Dispense  Refill  . ibuprofen (ADVIL,MOTRIN) 200 MG tablet   Oral   Take 400 mg by mouth every 8 (eight) hours as needed for pain.         . mesalamine (ASACOL) 400 MG EC tablet   Oral   Take 1 tablet (400 mg total) by mouth 3 (three) times daily.   30 tablet   0   . ondansetron (ZOFRAN) 4 MG tablet   Oral   Take 1 tablet (4 mg total) by mouth every 6 (six) hours.   12 tablet   0  BP 126/81  Pulse 79  Temp(Src) 98.7 F (37.1 C) (Oral)  Resp 17  SpO2 100%  Physical Exam  Nursing note and vitals reviewed. Constitutional: He is oriented to person, place, and time. He appears well-developed and well-nourished. No distress.  HENT:  Head: Normocephalic and atraumatic.  Eyes: Conjunctivae and EOM are normal. Pupils are equal, round, and reactive to light.  Neck: Normal range of motion. Neck supple.  Negative neck stiffness Negative nuchal rigidity Negative lymphadenopathy    Cardiovascular: Normal rate, regular rhythm and normal heart sounds.  Exam reveals no friction rub.   No murmur heard. Pulses:      Radial pulses are 2+ on the right side, and 2+ on the left side.  Pulmonary/Chest: Effort normal and breath sounds normal. No respiratory distress. He has no wheezes. He has no rales.  Abdominal: Soft. Bowel sounds are normal. He exhibits no distension. There is no hepatosplenomegaly. There is generalized tenderness. There is no rigidity, no rebound, no guarding, no tenderness at McBurney's point and negative Murphy's sign. No hernia.    Genitourinary: Rectum normal. Guaiac negative stool.  Rectal/Anal: Negative hemorrhoids, lesions, sores noted. Sphincter tone intact and proper. Negative blood on glove. Brown stools noted.   Lymphadenopathy:    He has no cervical adenopathy.  Neurological: He is alert and oriented to person, place, and time. No cranial nerve deficit. He exhibits normal muscle tone. Coordination normal.  Skin: Skin is warm and dry. He is not diaphoretic.  Psychiatric: He has a normal mood and affect. His behavior is normal. Thought content normal.    ED Course  Procedures (including critical care time)  Medications  sodium chloride 0.9 % bolus 1,000 mL (0 mLs Intravenous Stopped 10/02/12 1607)  HYDROmorphone (DILAUDID) injection 1 mg (1 mg Intravenous Given 10/02/12 1338)    Labs Reviewed  CBC WITH DIFFERENTIAL - Abnormal; Notable for the following:    RBC 3.98 (*)    Hemoglobin 7.4 (*)    HCT 24.2 (*)    MCV 60.8 (*)    MCH 18.6 (*)    RDW 17.7 (*)    Platelets 582 (*)    Eosinophils Relative 15 (*)    Eosinophils Absolute 0.9 (*)    All other components within normal limits  COMPREHENSIVE METABOLIC PANEL - Abnormal; Notable for the following:    Albumin 2.7 (*)    Total Bilirubin 0.1 (*)    All other components within normal limits  LIPASE, BLOOD  OCCULT BLOOD, POC DEVICE   Dg Abd Acute W/chest  10/02/2012   *RADIOLOGY REPORT*   Clinical Data: Lower abdominal pain, diarrhea, history Crohn's disease, question perforation, history smoking  ACUTE ABDOMEN SERIES (ABDOMEN 2 VIEW & CHEST 1 VIEW)  Comparison: 04/04/2012  Findings: Normal heart size, mediastinal contours, and pulmonary vascularity. Minimal chronic peribronchial thickening. Lungs clear. No pleural effusion or pneumothorax. Upper thoracic levoconvex scoliosis again noted.  Nonobstructive bowel gas pattern. Single loop of minimally prominent small bowel noted in left upper quadrant. Gas identified within colon. No definite bowel wall thickening or free intraperitoneal air. Few nonspecific calcifications in pelvis.  IMPRESSION: Nonspecific bowel gas pattern. Minimal chronic bronchitic changes.   Original Report Authenticated By: Ulyses Southward, M.D.     1. Abdominal  pain, other specified site   2. Celiac disease   3. Crohn's disease, without complications       MDM  Patient stable, afebrile. Patient seen in 05/2012 - CT abdomen and pelvis performed - active Crohn's disease  noted.  Imaging ordered to rule out perforation, free air.  Negative elevation of WBC. Anemia noted - Hgb and Hct lowered - similar trend noted to previous labs when reviewed, Hgb trend  7.3-7.6. Negative fecal occult. CMP negative findings. Lipase negative. Acute abdomen xray negative perforation or free air noted to imaging. Patient stable, afebrile. Discomfort controlled in ED setting. Suspicion to be celiac disease discomfort, associated with non medical compliance with control of Crohn's. Less likely to be Crohn's flare-up - negative fecal occult, fever, elevated WBC. Discussed case with Dr. Lars Mage - cleared patient for discharge. Discharged patient with medications for control. Referred patient to adult care clinic and gastroenterologist. Discussed with patient diet. Discussed with patient to continue to monitor symptoms and if symptoms are to worsen or change to report back to the ED - strict  return instructions given. Patient agreed to plan of care, understood, all questions answered.           Raymon Mutton, PA-C 10/02/12 2129

## 2012-10-03 NOTE — ED Provider Notes (Signed)
Medical screening examination/treatment/procedure(s) were performed by non-physician practitioner and as supervising physician I was immediately available for consultation/collaboration. Devoria Albe, MD, FACEP Pt was specifically discussed with me about his treatment course and discharge  Ward Givens, MD 10/03/12 [phone]

## 2012-10-05 ENCOUNTER — Encounter (HOSPITAL_COMMUNITY): Payer: Self-pay | Admitting: *Deleted

## 2012-10-05 ENCOUNTER — Inpatient Hospital Stay (HOSPITAL_COMMUNITY)
Admission: EM | Admit: 2012-10-05 | Discharge: 2012-10-06 | DRG: 387 | Disposition: A | Discharge status: Home / Self Care | Payer: Medicaid Other | Attending: Internal Medicine | Admitting: Internal Medicine

## 2012-10-05 ENCOUNTER — Emergency Department (HOSPITAL_COMMUNITY): Payer: Medicaid Other

## 2012-10-05 DIAGNOSIS — R109 Unspecified abdominal pain: Secondary | ICD-10-CM

## 2012-10-05 DIAGNOSIS — K50018 Crohn's disease of small intestine with other complication: Secondary | ICD-10-CM

## 2012-10-05 DIAGNOSIS — K5080 Crohn's disease of both small and large intestine without complications: Secondary | ICD-10-CM | POA: Diagnosis present

## 2012-10-05 DIAGNOSIS — R0609 Other forms of dyspnea: Secondary | ICD-10-CM | POA: Diagnosis present

## 2012-10-05 DIAGNOSIS — D509 Iron deficiency anemia, unspecified: Secondary | ICD-10-CM | POA: Diagnosis present

## 2012-10-05 DIAGNOSIS — K5000 Crohn's disease of small intestine without complications: Principal | ICD-10-CM | POA: Diagnosis present

## 2012-10-05 DIAGNOSIS — D649 Anemia, unspecified: Secondary | ICD-10-CM

## 2012-10-05 DIAGNOSIS — K900 Celiac disease: Secondary | ICD-10-CM | POA: Diagnosis present

## 2012-10-05 DIAGNOSIS — R0989 Other specified symptoms and signs involving the circulatory and respiratory systems: Secondary | ICD-10-CM | POA: Diagnosis present

## 2012-10-05 LAB — COMPREHENSIVE METABOLIC PANEL
ALT: 13 U/L (ref 0–53)
AST: 19 U/L (ref 0–37)
Albumin: 2.9 g/dL — ABNORMAL LOW (ref 3.5–5.2)
Alkaline Phosphatase: 55 U/L (ref 39–117)
BUN: 8 mg/dL (ref 6–23)
Chloride: 101 mEq/L (ref 96–112)
Potassium: 3.3 mEq/L — ABNORMAL LOW (ref 3.5–5.1)
Sodium: 134 mEq/L — ABNORMAL LOW (ref 135–145)
Total Bilirubin: 0.1 mg/dL — ABNORMAL LOW (ref 0.3–1.2)
Total Protein: 6.9 g/dL (ref 6.0–8.3)

## 2012-10-05 LAB — CBC WITH DIFFERENTIAL/PLATELET
Basophils Absolute: 0.1 10*3/uL (ref 0.0–0.1)
Basophils Relative: 1 % (ref 0–1)
Eosinophils Absolute: 0.8 10*3/uL — ABNORMAL HIGH (ref 0.0–0.7)
HCT: 23.2 % — ABNORMAL LOW (ref 39.0–52.0)
Hemoglobin: 6.8 g/dL — CL (ref 13.0–17.0)
Lymphocytes Relative: 15 % (ref 12–46)
Monocytes Relative: 14 % — ABNORMAL HIGH (ref 3–12)
Neutro Abs: 5 10*3/uL (ref 1.7–7.7)
Neutrophils Relative %: 60 % (ref 43–77)
RDW: 18 % — ABNORMAL HIGH (ref 11.5–15.5)
WBC: 8.3 10*3/uL (ref 4.0–10.5)

## 2012-10-05 LAB — IRON AND TIBC: UIBC: 298 ug/dL (ref 125–400)

## 2012-10-05 LAB — PREPARE RBC (CROSSMATCH)

## 2012-10-05 LAB — FERRITIN: Ferritin: 17 ng/mL — ABNORMAL LOW (ref 22–322)

## 2012-10-05 MED ORDER — PREDNISONE 50 MG PO TABS
50.0000 mg | ORAL_TABLET | Freq: Every day | ORAL | Status: DC
  Administered 2012-10-06: 50 mg via ORAL
  Filled 2012-10-05 (×2): qty 1

## 2012-10-05 MED ORDER — MESALAMINE 400 MG PO CPDR
400.0000 mg | DELAYED_RELEASE_CAPSULE | Freq: Three times a day (TID) | ORAL | Status: DC
  Administered 2012-10-05 – 2012-10-06 (×4): 400 mg via ORAL
  Filled 2012-10-05 (×6): qty 1

## 2012-10-05 MED ORDER — POTASSIUM CHLORIDE CRYS ER 20 MEQ PO TBCR
40.0000 meq | EXTENDED_RELEASE_TABLET | Freq: Once | ORAL | Status: AC
  Administered 2012-10-05: 40 meq via ORAL
  Filled 2012-10-05: qty 2

## 2012-10-05 MED ORDER — CYCLOBENZAPRINE HCL 5 MG PO TABS
5.0000 mg | ORAL_TABLET | Freq: Once | ORAL | Status: AC
  Administered 2012-10-05: 5 mg via ORAL
  Filled 2012-10-05: qty 1

## 2012-10-05 MED ORDER — SODIUM CHLORIDE 0.9 % IV SOLN
INTRAVENOUS | Status: AC
  Administered 2012-10-05: 07:00:00 via INTRAVENOUS

## 2012-10-05 MED ORDER — SODIUM CHLORIDE 0.9 % IV BOLUS (SEPSIS)
1000.0000 mL | Freq: Once | INTRAVENOUS | Status: AC
  Administered 2012-10-05: 1000 mL via INTRAVENOUS

## 2012-10-05 MED ORDER — ONDANSETRON HCL 4 MG/2ML IJ SOLN
4.0000 mg | Freq: Once | INTRAMUSCULAR | Status: AC
  Administered 2012-10-05: 4 mg via INTRAVENOUS
  Filled 2012-10-05: qty 2

## 2012-10-05 MED ORDER — HYDROMORPHONE HCL PF 1 MG/ML IJ SOLN
1.0000 mg | INTRAMUSCULAR | Status: DC | PRN
  Administered 2012-10-05 (×2): 1 mg via INTRAVENOUS
  Filled 2012-10-05 (×2): qty 1

## 2012-10-05 MED ORDER — IOHEXOL 300 MG/ML  SOLN
50.0000 mL | Freq: Once | INTRAMUSCULAR | Status: AC | PRN
  Administered 2012-10-05: 50 mL via ORAL

## 2012-10-05 MED ORDER — PREDNISONE 20 MG PO TABS
60.0000 mg | ORAL_TABLET | Freq: Once | ORAL | Status: AC
  Administered 2012-10-05: 60 mg via ORAL
  Filled 2012-10-05: qty 3

## 2012-10-05 MED ORDER — FERROUS SULFATE 325 (65 FE) MG PO TABS
325.0000 mg | ORAL_TABLET | Freq: Two times a day (BID) | ORAL | Status: DC
  Administered 2012-10-05 – 2012-10-06 (×3): 325 mg via ORAL
  Filled 2012-10-05 (×5): qty 1

## 2012-10-05 MED ORDER — HYDROMORPHONE HCL PF 1 MG/ML IJ SOLN
1.0000 mg | INTRAMUSCULAR | Status: AC | PRN
  Administered 2012-10-05 – 2012-10-06 (×2): 1 mg via INTRAVENOUS
  Filled 2012-10-05 (×2): qty 1

## 2012-10-05 MED ORDER — FENTANYL CITRATE 0.05 MG/ML IJ SOLN
50.0000 ug | Freq: Once | INTRAMUSCULAR | Status: AC
  Administered 2012-10-05: 50 ug via INTRAVENOUS
  Filled 2012-10-05 (×2): qty 2

## 2012-10-05 MED ORDER — MORPHINE SULFATE 4 MG/ML IJ SOLN
4.0000 mg | Freq: Once | INTRAMUSCULAR | Status: AC
  Administered 2012-10-05: 4 mg via INTRAVENOUS
  Filled 2012-10-05: qty 1

## 2012-10-05 MED ORDER — SODIUM CHLORIDE 0.9 % IV SOLN
INTRAVENOUS | Status: DC
  Administered 2012-10-05: 21:00:00 via INTRAVENOUS

## 2012-10-05 MED ORDER — HYDROMORPHONE HCL PF 1 MG/ML IJ SOLN
1.0000 mg | Freq: Once | INTRAMUSCULAR | Status: AC
  Administered 2012-10-05: 1 mg via INTRAVENOUS
  Filled 2012-10-05: qty 1

## 2012-10-05 MED ORDER — ONDANSETRON HCL 4 MG/2ML IJ SOLN: 4.0000 mg | Freq: Three times a day (TID) | INTRAMUSCULAR | Status: AC | PRN

## 2012-10-05 MED ORDER — IOHEXOL 300 MG/ML  SOLN
100.0000 mL | Freq: Once | INTRAMUSCULAR | Status: AC | PRN
  Administered 2012-10-05: 100 mL via INTRAVENOUS

## 2012-10-05 NOTE — ED Provider Notes (Signed)
History     CSN: [id number]  Arrival date & time 10/05/12  0119   First MD Initiated Contact with Patient 10/05/12 0202      Chief Complaint  Patient presents with  . Abdominal Pain    (Consider location/radiation/quality/duration/timing/severity/associated sxs/prior treatment) HPI  Shane Klein is a 22 y.o. male past medical history significant for celiac and Crohn's disease with obstruction complaining of diffuse, severe abdominal pain worsening over the course of the last 7 days. Patient denies diarrhea, nausea vomiting, fever. He states that he is passing gas at this time. Patient does not have a GI doctor to to monetary issues.  Past Medical History  Diagnosis Date  . 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. Celiac disease   . Crohn's disease with intestinal obstruction 03/01/2012  . Iron deficiency anemia   . GERD (gastroesophageal reflux disease)     History reviewed. No pertinent past surgical history.  Family History  Problem Relation Age of Onset  . Diabetes Maternal Aunt   . Diabetes Maternal Grandmother     History  Substance Use Topics  . Smoking status: Former Smoker    Types: Cigarettes    Quit date: 03/01/2010  . Smokeless tobacco: Never Used  . Alcohol Use: No     Comment: rarely      Review of Systems  Constitutional: Negative for fever.  Respiratory: Negative for shortness of breath.   Cardiovascular: Negative for chest pain.  Gastrointestinal: Positive for abdominal pain. Negative for nausea, vomiting and diarrhea.  All other systems reviewed and are negative.    Allergies  Gluten meal and Wheat bran  Home Medications   Current Outpatient Rx  Name  Route  Sig  Dispense  Refill  . ibuprofen (ADVIL,MOTRIN) 200 MG tablet   Oral   Take 400 mg by mouth every 8 (eight) hours as needed for pain.         . mesalamine (ASACOL) 400 MG EC tablet   Oral   Take 1 tablet (400 mg total) by mouth 3 (three) times daily.   30 tablet   0   . ondansetron (ZOFRAN)  4 MG tablet   Oral   Take 1 tablet (4 mg total) by mouth every 6 (six) hours.   12 tablet   0     BP 132/79  Pulse 107  Temp(Src) 98.3 F (36.8 C) (Oral)  Resp 18  SpO2 98%  Physical Exam  Nursing note and vitals reviewed. Constitutional: He is oriented to person, place, and time. He appears well-developed and well-nourished. No distress.  HENT:  Head: Normocephalic and atraumatic.  Mouth/Throat: Oropharynx is clear and moist.  Eyes: Conjunctivae and EOM are normal. Pupils are equal, round, and reactive to light.  Neck: Normal range of motion.  Cardiovascular: Normal rate and regular rhythm.   Pulmonary/Chest: Effort normal and breath sounds normal. No stridor. No respiratory distress. He has no wheezes. He has no rales. He exhibits no tenderness.  Abdominal: He exhibits distension. There is tenderness.  Moderate distention, hyperactive bowel sounds, diffusely tender to palpation with no guarding or rebound  Musculoskeletal: Normal range of motion.  Neurological: He is alert and oriented to person, place, and time.  Psychiatric: He has a normal mood and affect.    ED Course  Procedures (including critical care time)  Labs Reviewed  CBC WITH DIFFERENTIAL - Abnormal; Notable for the following:    RBC 3.87 (*)    Hemoglobin 6.8 (*)    HCT 23.2 (*)  MCV 59.9 (*)    MCH 17.6 (*)    MCHC 29.3 (*)    RDW 18.0 (*)    Platelets 588 (*)    Monocytes Relative 14 (*)    Eosinophils Relative 10 (*)    Monocytes Absolute 1.2 (*)    Eosinophils Absolute 0.8 (*)    All other components within normal limits  COMPREHENSIVE METABOLIC PANEL - Abnormal; Notable for the following:    Sodium 134 (*)    Potassium 3.3 (*)    Albumin 2.9 (*)    Total Bilirubin 0.1 (*)    All other components within normal limits  TYPE AND SCREEN  ABO/RH   Ct Abdomen Pelvis W Contrast  10/05/2012   *RADIOLOGY REPORT*  Clinical Data: Abdominal pain and tightness for 1 week.  White cell count 823.   History of Crohn's and celiac disease.  CT ABDOMEN AND PELVIS WITH CONTRAST  Technique:  Multidetector CT imaging of the abdomen and pelvis was performed following the standard protocol during bolus administration of intravenous contrast.  Contrast: OMNIPAQUE IOHEXOL 300 MG/ML  SOLN  Comparison: 06/22/2012  Findings: The lung bases are clear.  Residual contrast material in the esophagus could be due to dysmotility or reflux versus active swallowing.  Small amount of free fluid in the upper abdomen along the liver edge.  Hounsfield unit measurements are consistent with ascites. The liver, spleen, gallbladder, pancreas, adrenal glands, kidneys, abdominal aorta, and retroperitoneal lymph nodes are unremarkable. The stomach is not abnormally distended.  There is mild dilatation of pelvic small bowel with small bowel wall thickening and mural edema involving the ileum.  Increased density of the wall suggesting enhancement.  Increased density inflammatory tissue in the right lower quadrant mesenteric fat also suggests enhancement. Changes are consistent with active Crohn disease.  Demonstrates some progression since the previous study.  Mesenteric edema. Prominent lymph nodes are present in the mesentery probably representing reactive nodes.  No free air in the abdomen.  Stool filled colon is not abnormally distended.  Pelvis:  Free pelvic fluid consistent with ascites.  Bladder wall is not thickened.  No loculated fluid to suggest abscess.  Appendix is not identified.  IMPRESSION: Pelvic small bowel and ileal loops demonstrate distension with thickened and edematous wall. Inflammatory infiltration in the right lower quadrant fat.  Reactive mesenteric lymph nodes. Abdominal and pelvic ascites.  Changes are consistent with active Crohn disease.  Demonstrate some progression since previous study.   Original Report Authenticated By: Burman Nieves, M.D.   Dg Abd Acute W/chest  10/05/2012   *RADIOLOGY REPORT*   Clinical Data: Upper abdominal pain and cramping for 48 hours.  ACUTE ABDOMEN SERIES (ABDOMEN 2 VIEW & CHEST 1 VIEW)  Comparison: CT abdomen and pelvis 06/22/2012.  Abdominal series 04/04/2012.  Findings: Slightly shallow inspiration.  The heart size and pulmonary vascularity are normal. The lungs appear clear and expanded without focal air space disease or consolidation. No blunting of the costophrenic angles.  No pneumothorax.  Mediastinal contours appear intact.  Scattered gas and stool in the colon with mild prominence of gas- filled small bowel in the right lower quadrant.  Right lower quadrant small bowel are not distended but demonstrates some evidence of wall thickening.  Changes may be related to inflammatory process consistent with patient's known Crohn disease. No small or large bowel distension.  No free intra-abdominal air. No abnormal air fluid levels.  No radiopaque stones.  Visualized bones appear intact.  IMPRESSION: Focal gas filled small  bowel loop in the right lower quadrant with evidence of wall thickening suggesting inflammatory changes due to the patient's Crohn disease.  Nonobstructive bowel gas pattern.  No evidence of active pulmonary disease.   Original Report Authenticated By: Burman Nieves, M.D.     1. Abdominal pain   2. Anemia       MDM   Filed Vitals:   10/05/12 0122  BP: 132/79  Pulse: 107  Temp: 98.3 F (36.8 C)  TempSrc: Oral  Resp: 18  SpO2: 98%     Shane Klein is a 22 y.o. male with Crohn's and celiac disease, not followed by GI because he has no insurance. Patient's shows a profound anemia 6.8 however he is asymptomatic with no chest pain, palpitations or shortness of breath.  Patient has distended abdomen, severe diffuse tenderness to palpation. CT shows no distention and thickened and edematous wall. Patient will be started on prednisone and admitted to inpatient service with plans GI consult in the a.m.  Medications  morphine 4 MG/ML  injection 4 mg (not administered)  ondansetron (ZOFRAN) injection 4 mg (not administered)  sodium chloride 0.9 % bolus 1,000 mL (not administered)  fentaNYL (SUBLIMAZE) injection 50 mcg (50 mcg Intravenous Given 10/05/12 0224)     Wynetta Emery, PA-C 10/05/12 1610

## 2012-10-05 NOTE — Progress Notes (Signed)
Pt admitted from the ED. Vital signs stable, pt orientated to the unit. Family at bedside. Orders released and initiated. Pt complains of abdominal pain, PRN medication given.

## 2012-10-05 NOTE — Progress Notes (Signed)
[facility] PROGRESS NOTE  Shane Klein HTD:[id number] DOB: 05-01-1990 DOA: 10/05/2012 PCP: No primary provider on file.  Assessment/Plan: 1-Crohn's disease acute exacerbation: Continue with IV fluids, pain medications, prednisone, mesalamine. GI consulted.   2-Microcytic hypochromic anemia: Iron and ferritin pending. Order B-26 and folic acid level. Patient relates dyspnea on exertion. Will transfuse 1 unit of PRBC.    Code Status: Full Code.  Family Communication: Care discussed with patient and wife who was at bedside.  Disposition Plan: remain in hospital.    Consultants:  [facility].   Procedures:  none  Antibiotics:  none  HPI/Subjective: Pain some what improved, 4/10. Diarrhea improved.   Objective: Filed Vitals:   10/05/12 0122 10/05/12 0456 10/05/12 0633  BP: 132/79 126/57 112/70  Pulse: 107 81 67  Temp: 98.3 F (36.8 C) 99.3 F (37.4 C) 97.5 F (36.4 C)  TempSrc: Oral Oral Oral  Resp: 18 18 16   SpO2: 98% 98% 99%   No intake or output data in the 24 hours ending 10/05/12 0934 There were no vitals filed for this visit.  Exam:   General:  No distress.   Cardiovascular: S 1, S 2 RRR  Respiratory: CTA  Abdomen: BS present, soft, NT       Musculoskeletal: no edema.   Data Reviewed: Basic Metabolic Panel:  Recent Labs Lab 10/02/12 1122 10/05/12 0220  NA 140 134*  K 3.5 3.3*  CL 107 101  CO2 27 24  GLUCOSE 91 93  BUN 14 8  CREATININE 0.83 0.75  CALCIUM 9.0 9.2   Liver Function Tests:  Recent Labs Lab 10/02/12 1122 10/05/12 0220  AST 20 19  ALT 15 13  ALKPHOS 52 55  BILITOT 0.1* 0.1*  PROT 6.6 6.9  ALBUMIN 2.7* 2.9*    Recent Labs Lab 10/02/12 1122  LIPASE 17   No results found for this basename: AMMONIA,  in the last 168 hours CBC:  Recent Labs Lab 10/02/12 1122 10/05/12 0220  WBC 6.3 8.3  NEUTROABS 3.6 5.0  HGB 7.4* 6.8*  HCT 24.2* 23.2*  MCV 60.8* 59.9*  PLT 582* 588*   Cardiac Enzymes: No  results found for this basename: CKTOTAL, CKMB, CKMBINDEX, TROPONINI,  in the last 168 hours BNP (last 3 results)  Recent Labs  03/13/12 1847  PROBNP 462.6*   CBG: No results found for this basename: GLUCAP,  in the last 168 hours  No results found for this or any previous visit (from the past 240 hour(s)).   Studies: Ct Abdomen Pelvis W Contrast  10/05/2012   *RADIOLOGY REPORT*  Clinical Data: Abdominal pain and tightness for 1 week.  White cell count 823.  History of Crohn's and celiac disease.  CT ABDOMEN AND PELVIS WITH CONTRAST  Technique:  Multidetector CT imaging of the abdomen and pelvis was performed following the standard protocol during bolus administration of intravenous contrast.  Contrast: 160m OMNIPAQUE IOHEXOL 300 MG/ML  SOLN  Comparison: 06/22/2012  Findings: The lung bases are clear.  Residual contrast material in the esophagus could be due to dysmotility or reflux versus active swallowing.  Small amount of free fluid in the upper abdomen along the liver edge.  Hounsfield unit measurements are consistent with ascites. The liver, spleen, gallbladder, pancreas, adrenal glands, kidneys, abdominal aorta, and retroperitoneal lymph nodes are unremarkable. The stomach is not abnormally distended.  There is mild dilatation of pelvic small bowel with small bowel wall thickening and mural edema involving the ileum.  Increased density of the wall  suggesting enhancement.  Increased density inflammatory tissue in the right lower quadrant mesenteric fat also suggests enhancement. Changes are consistent with active Crohn disease.  Demonstrates some progression since the previous study.  Mesenteric edema. Prominent lymph nodes are present in the mesentery probably representing reactive nodes.  No free air in the abdomen.  Stool filled colon is not abnormally distended.  Pelvis:  Free pelvic fluid consistent with ascites.  Bladder wall is not thickened.  No loculated fluid to suggest abscess.   Appendix is not identified.  IMPRESSION: Pelvic small bowel and ileal loops demonstrate distension with thickened and edematous wall. Inflammatory infiltration in the right lower quadrant fat.  Reactive mesenteric lymph nodes. Abdominal and pelvic ascites.  Changes are consistent with active Crohn disease.  Demonstrate some progression since previous study.   Original Report Authenticated By: Lucienne Capers, M.D.   Dg Abd Acute W/chest  10/05/2012   *RADIOLOGY REPORT*  Clinical Data: Upper abdominal pain and cramping for 48 hours.  ACUTE ABDOMEN SERIES (ABDOMEN 2 VIEW & CHEST 1 VIEW)  Comparison: CT abdomen and pelvis 06/22/2012.  Abdominal series 04/04/2012.  Findings: Slightly shallow inspiration.  The heart size and pulmonary vascularity are normal. The lungs appear clear and expanded without focal air space disease or consolidation. No blunting of the costophrenic angles.  No pneumothorax.  Mediastinal contours appear intact.  Scattered gas and stool in the colon with mild prominence of gas- filled small bowel in the right lower quadrant.  Right lower quadrant small bowel are not distended but demonstrates some evidence of wall thickening.  Changes may be related to inflammatory process consistent with patient's known Crohn disease. No small or large bowel distension.  No free intra-abdominal air. No abnormal air fluid levels.  No radiopaque stones.  Visualized bones appear intact.  IMPRESSION: Focal gas filled small bowel loop in the right lower quadrant with evidence of wall thickening suggesting inflammatory changes due to the patient's Crohn disease.  Nonobstructive bowel gas pattern.  No evidence of active pulmonary disease.   Original Report Authenticated By: Lucienne Capers, M.D.    Scheduled Meds: . sodium chloride   Intravenous STAT  . ferrous sulfate  325 mg Oral BID WC  . Mesalamine  400 mg Oral TID  . [START ON 10/06/2012] predniSONE  50 mg Oral Q breakfast   Continuous Infusions: .  sodium chloride      Principal Problem:   Crohn's disease of ileum Active Problems:   ANEMIA-IRON DEFICIENCY   CROHN'S DISEASE-LARGE & SMALL INTESTINE    Time spent: 25 minutes.     REGALADO,BELKYS  [facility] Pager [phone]. If 7PM-7AM, please contact night-coverage at [email or web address], password [facility] 10/05/2012, 9:34 AM  LOS: 0 days

## 2012-10-05 NOTE — H&P (Signed)
[facility] History and Physical  Shane Klein:[id number] DOB: 1990-09-07 DOA: 10/05/2012  Referring physician: ED PCP: No primary provider on file.   Chief Complaint: Abdominal pain  HPI: Shane Klein is a 22 y.o. male with a known history of celiac and Crohn's disease with pmh of obstruction who presents to the ED with c/o diffuse, severe abdominal pain which has been worsening over the past 7 days.  Patient does not see a GI doc on a routine basis due to lack of insurance, patient had been seen in the ED 9 days ago and prescribed to start mesalamine but he admits that he didn't / wasn't able to get this filled.  Unfortunately since his last visit in 02/2012 the patient has just been coming to the ED on a PRN basis when his Chron's disease flares up and this dosent seem to be working well.  In the ED patient was found to have some worsening of his chronic anemia (hgb 6.8 was 7.4 at baseline), and as suspected his CT scan is consistent with an active Chron's flare.  Hospitalist has been asked to admit.  Review of Systems: 12 systems reviewed and otherwise negative.  Past Medical History  Diagnosis Date  . 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. Celiac disease   . Crohn's disease with intestinal obstruction 03/01/2012  . Iron deficiency anemia   . GERD (gastroesophageal reflux disease)    History reviewed. No pertinent past surgical history. Social History:  reports that he quit smoking about 2 years ago. His smoking use included Cigarettes. He smoked 0.00 packs per day. He has never used smokeless tobacco. He reports that he does not drink alcohol or use illicit drugs.   Allergies  Allergen Reactions  . Gluten Meal Other (See Comments)    Stomach pain  . Wheat Bran Other (See Comments)    Belly pain    Family History  Problem Relation Age of Onset  . Diabetes Maternal Aunt   . Diabetes Maternal Grandmother     Prior to Admission medications   Medication Sig Start Date End Date  Taking? Authorizing Provider  ibuprofen (ADVIL,MOTRIN) 200 MG tablet Take 400 mg by mouth every 8 (eight) hours as needed for pain.   Yes Historical Provider, MD  mesalamine (ASACOL) 400 MG EC tablet Take 1 tablet (400 mg total) by mouth 3 (three) times daily. 10/02/12  Yes Marissa Sciacca, PA-C  ondansetron (ZOFRAN) 4 MG tablet Take 1 tablet (4 mg total) by mouth every 6 (six) hours. 10/02/12  Yes Marissa Sciacca, PA-C   Physical Exam: Filed Vitals:   10/05/12 0122 10/05/12 0456  BP: 132/79 126/57  Pulse: 107 81  Temp: 98.3 F (36.8 C) 99.3 F (37.4 C)  TempSrc: Oral Oral  Resp: 18 18  SpO2: 98% 98%    General:  NAD, resting comfortably in bed Eyes: PEERLA EOMI ENT: mucous membranes moist Neck: supple w/o JVD Cardiovascular: RRR w/o MRG Respiratory: CTA B Abdomen: mild distention, hyperactive BS, diffuse tenderness w/o rebound w/o guarding Skin: no rash nor lesion Musculoskeletal: MAE, full ROM all 4 extremities Psychiatric: normal tone and affect Neurologic: AAOx3, grossly non-focal   Labs on Admission:  Basic Metabolic Panel:  Recent Labs Lab 10/02/12 1122 10/05/12 0220  NA 140 134*  K 3.5 3.3*  CL 107 101  CO2 27 24  GLUCOSE 91 93  BUN 14 8  CREATININE 0.83 0.75  CALCIUM 9.0 9.2   Liver Function Tests:  Recent Labs Lab 10/02/12 1122 10/05/12  0220  AST 20 19  ALT 15 13  ALKPHOS 52 55  BILITOT 0.1* 0.1*  PROT 6.6 6.9  ALBUMIN 2.7* 2.9*    Recent Labs Lab 10/02/12 1122  LIPASE 17   No results found for this basename: AMMONIA,  in the last 168 hours CBC:  Recent Labs Lab 10/02/12 1122 10/05/12 0220  WBC 6.3 8.3  NEUTROABS 3.6 5.0  HGB 7.4* 6.8*  HCT 24.2* 23.2*  MCV 60.8* 59.9*  PLT 582* 588*   Cardiac Enzymes: No results found for this basename: CKTOTAL, CKMB, CKMBINDEX, TROPONINI,  in the last 168 hours  BNP (last 3 results)  Recent Labs  03/13/12 1847  PROBNP 462.6*   CBG: No results found for this basename: GLUCAP,  in the  last 168 hours  Radiological Exams on Admission: Ct Abdomen Pelvis W Contrast  10/05/2012   *RADIOLOGY REPORT*  Clinical Data: Abdominal pain and tightness for 1 week.  White cell count 823.  History of Crohn's and celiac disease.  CT ABDOMEN AND PELVIS WITH CONTRAST  Technique:  Multidetector CT imaging of the abdomen and pelvis was performed following the standard protocol during bolus administration of intravenous contrast.  Contrast: 170m OMNIPAQUE IOHEXOL 300 MG/ML  SOLN  Comparison: 06/22/2012  Findings: The lung bases are clear.  Residual contrast material in the esophagus could be due to dysmotility or reflux versus active swallowing.  Small amount of free fluid in the upper abdomen along the liver edge.  Hounsfield unit measurements are consistent with ascites. The liver, spleen, gallbladder, pancreas, adrenal glands, kidneys, abdominal aorta, and retroperitoneal lymph nodes are unremarkable. The stomach is not abnormally distended.  There is mild dilatation of pelvic small bowel with small bowel wall thickening and mural edema involving the ileum.  Increased density of the wall suggesting enhancement.  Increased density inflammatory tissue in the right lower quadrant mesenteric fat also suggests enhancement. Changes are consistent with active Crohn disease.  Demonstrates some progression since the previous study.  Mesenteric edema. Prominent lymph nodes are present in the mesentery probably representing reactive nodes.  No free air in the abdomen.  Stool filled colon is not abnormally distended.  Pelvis:  Free pelvic fluid consistent with ascites.  Bladder wall is not thickened.  No loculated fluid to suggest abscess.  Appendix is not identified.  IMPRESSION: Pelvic small bowel and ileal loops demonstrate distension with thickened and edematous wall. Inflammatory infiltration in the right lower quadrant fat.  Reactive mesenteric lymph nodes. Abdominal and pelvic ascites.  Changes are consistent with  active Crohn disease.  Demonstrate some progression since previous study.   Original Report Authenticated By: WLucienne Capers M.D.   Dg Abd Acute W/chest  10/05/2012   *RADIOLOGY REPORT*  Clinical Data: Upper abdominal pain and cramping for 48 hours.  ACUTE ABDOMEN SERIES (ABDOMEN 2 VIEW & CHEST 1 VIEW)  Comparison: CT abdomen and pelvis 06/22/2012.  Abdominal series 04/04/2012.  Findings: Slightly shallow inspiration.  The heart size and pulmonary vascularity are normal. The lungs appear clear and expanded without focal air space disease or consolidation. No blunting of the costophrenic angles.  No pneumothorax.  Mediastinal contours appear intact.  Scattered gas and stool in the colon with mild prominence of gas- filled small bowel in the right lower quadrant.  Right lower quadrant small bowel are not distended but demonstrates some evidence of wall thickening.  Changes may be related to inflammatory process consistent with patient's known Crohn disease. No small or large bowel distension.  No  free intra-abdominal air. No abnormal air fluid levels.  No radiopaque stones.  Visualized bones appear intact.  IMPRESSION: Focal gas filled small bowel loop in the right lower quadrant with evidence of wall thickening suggesting inflammatory changes due to the patient's Crohn disease.  Nonobstructive bowel gas pattern.  No evidence of active pulmonary disease.   Original Report Authenticated By: Lucienne Capers, M.D.    EKG: Independently reviewed.  Assessment/Plan Principal Problem:   Crohn's disease of ileum Active Problems:   ANEMIA-IRON DEFICIENCY   CROHN'S DISEASE-LARGE & SMALL INTESTINE   1. Crohn's disease of ileum - noncompliance, patient isnt currently taking any chronic medications for prevention of Crohn's flares.  As a result he is currently and fairly frequently over the past few months having flares as demonstrated on todays CT scan. 1. GI consult - needs outpatient follow up and treatment  chronically 2. SW consult - if inability to pay is the barrier to getting him on treatment then will attempt to address this and see what financial aid we can obtain for him. 3. Prednisone 50 q day ordered for the acute flare 4. Have started the mesalamine that he was not taking at home. 5. Dilaudid 63m q4h PRN for pain 2. Microcytic hypochromic anemia - almost certainly is iron deficiency which hasnt been being treated, deficiency due likely to chronic blood loss from his uncontrolled Crohn's disease, asymptomatic and baseline only 7.4 so no indication for transfusion at this point.  Will however start patient on supplemental iron and repeat iron studies to confirm this.    Code Status: Full Code (must indicate code status--if unknown or must be presumed, indicate so) Family Communication: No family in room (indicate person spoken with, if applicable, with phone number if by telephone) Disposition Plan: Admit to obs (indicate anticipated LOS)  Time spent: 50 min  GARDNER, JARED M. [facility] Pager [phone] If 7PM-7AM, please contact night-coverage [email or web address] Password [facility]6/03/2013, 6:28 AM

## 2012-10-05 NOTE — ED Provider Notes (Signed)
Medical screening examination/treatment/procedure(s) were performed by non-physician practitioner and as supervising physician I was immediately available for consultation/collaboration.  Sunnie Nielsen, MD 10/05/12 [phone]

## 2012-10-05 NOTE — ED Notes (Signed)
Pt history of celiac disease and crohn's; c/o abd pain/tightness x 1 wk

## 2012-10-05 NOTE — Progress Notes (Signed)
Clinical Social Work Department BRIEF PSYCHOSOCIAL ASSESSMENT 10/05/2012  Patient:  Shane Klein, Shane Klein     Account Number:  [id number]     Admit date:  10/05/2012  Clinical Social Worker:  Dennison Bulla  Date/Time:  10/05/2012 02:15 PM  Referred by:  Physician  Date Referred:  10/05/2012 Referred for  Psychosocial assessment   Other Referral:   Interview type:  Patient Other interview type:    PSYCHOSOCIAL DATA Living Status:  FAMILY Admitted from facility:   Level of care:   Primary support name:  Mary Primary support relationship to patient:  FAMILY Degree of support available:   Adequate    CURRENT CONCERNS Current Concerns  Other - See comment   Other Concerns:   CSW consulted for financial assistance.    SOCIAL WORK ASSESSMENT / PLAN CSW received referral to complete psychosocial assessment. CSW reviewed chart and met with patient, girlfriend, and children at bedside. CSW introduced myself and explained role.    Patient reports he needed assistance because he does not have insurance. Patient reports he applied for Medicaid but was denied due to not having children. Patient reports that now that he is with his girlfriend and living with children he should qualify. CSW encouraged patient to follow up with the Department of Social Services to inquire about Medicaid.    Patient reports no further concerns. CSW is signing off but available if further needs arise.   Assessment/plan status:  Psychosocial Support/Ongoing Assessment of Needs Other assessment/ plan:   Information/referral to community resources:   Department of Social Services    PATIENT'S/FAMILY'S RESPONSE TO PLAN OF CARE: Patient alert and oriented. Patient minimally engaged but reports no further needs.

## 2012-10-05 NOTE — Consult Note (Signed)
Referring Provider: No ref. provider found Primary Care Physician:  No primary provider on file. Primary Gastroenterologist:  None- previously  discharged from  [facility]  Reason for Consultation:  Crohns disease  HPI: Shane Klein is a 22 y.o. male  with Crohn's ileocolitis initially diagnosed in 2010. He had been seen by Dr. Russella Dar but discharged for noncompliance with followup. Patient was admitted to the hospital in November of 2013 on the surgery service with a distal small bowel obstruction. At that time CT scan showed a 15 cm segment of terminal ileum which appeared to be strictured and actively inflamed. He improved with conservative management and was discharged on prednisone. Apparently prior to that time he had been incarcerated and had been released from prison the day prior to admission in November. He had taken Azulfidine in prison or period of time. During that admission he was seen by [facility] -Dr. Ewing Schlein who   arranged for outpatient followup. Patient says he could not make that appointment because he was reincarcerated for 30 days. He has not attempted any GI followup since because he has no insurance , He had an ER visit in February, then re- presented yesterday complaining of a one-week history of progressive abdominal pain. He says he is also been having loose stools with 5-6 bowel movements per day over the past week or so area He has not noted any melena or hematochezia. No fever or chills. No nausea or vomiting. He says he has been eating less and knows that he has lost weight but is uncertain of the amount. He also carries diagnosis of celiac disease and tries to follow a gluten-free diet which he says is difficult due to expense  Workup on admission yesterday with finding of marked anemia hemoglobin 6.8 MCV of 59 CT scan of the abdomen and pelvis done today shows active Crohn's ileitis with some progression since last scan areas inflammation and infiltration in the right  lower quadrant abdominal fat and reactive adenopathy as well as a small amount of ascites. He is not obstructed.   Past Medical History  Diagnosis Date  . 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. Celiac disease   . Crohn's disease with intestinal obstruction 03/01/2012  . Iron deficiency anemia   . GERD (gastroesophageal reflux disease)     History reviewed. No pertinent past surgical history.  Prior to Admission medications   Medication Sig Start Date End Date Taking? Authorizing Provider  ibuprofen (ADVIL,MOTRIN) 200 MG tablet Take 400 mg by mouth every 8 (eight) hours as needed for pain.   Yes Historical Provider, MD  mesalamine (ASACOL) 400 MG EC tablet Take 1 tablet (400 mg total) by mouth 3 (three) times daily. 10/02/12  Yes Marissa Sciacca, PA-C  ondansetron (ZOFRAN) 4 MG tablet Take 1 tablet (4 mg total) by mouth every 6 (six) hours. 10/02/12  Yes Marissa Sciacca, PA-C    Current Facility-Administered Medications  Medication Dose Route Frequency Provider Last Rate Last Dose  . 0.9 %  sodium chloride infusion   Intravenous STAT Nicole Pisciotta, PA-C 125 mL/hr at 10/05/12 0649    . 0.9 %  sodium chloride infusion   Intravenous Continuous Belkys A Regalado, MD 75 mL/hr at 10/05/12 0945    . ferrous sulfate tablet 325 mg  325 mg Oral BID WC Hillary Bow, DO   325 mg at 10/05/12 0931  . HYDROmorphone (DILAUDID) injection 1 mg  1 mg Intravenous Q4H PRN Wynetta Emery, PA-C   1 mg at 10/05/12  1191  . Mesalamine (ASACOL) DR capsule 400 mg  400 mg Oral TID Hillary Bow, DO      . ondansetron [facility]) injection 4 mg  4 mg Intravenous Q8H PRN Nicole Pisciotta, PA-C      . potassium chloride SA (K-DUR,KLOR-CON) CR tablet 40 mEq  40 mEq Oral Once Belkys A Regalado, MD      . Melene Muller ON 10/06/2012] predniSONE (DELTASONE) tablet 50 mg  50 mg Oral Q breakfast Hillary Bow, DO        Allergies as of 10/05/2012 - Review Complete 10/05/2012  Allergen Reaction Noted  . Gluten meal Other (See Comments)  10/02/2012  . Wheat bran Other (See Comments) 03/01/2012    Family History  Problem Relation Age of Onset  . Diabetes Maternal Aunt   . Diabetes Maternal Grandmother     History   Social History  . Marital Status: Single    Social History Main Topics  . Smoking status: Former Smoker    Types: Cigarettes    Quit date: 03/01/2010  . Smokeless tobacco: Never Used  . Alcohol Use: No     Comment: rarely  . Drug Use: No     Review of Systems: Pertinent positive and negative review of systems were noted in the above HPI section.  All other review of systems was otherwise negative. Physical Exam: Vital signs in last 24 hours: Temp:  [97.5 F (36.4 C)-99.3 F (37.4 C)] 97.5 F (36.4 C) (06/12 4782) Pulse Rate:  [67-107] 67 (06/12 0633) Resp:  [16-18] 16 (06/12 9562) BP: (112-132)/(57-79) 112/70 mmHg (06/12 0633) SpO2:  [98 %-99 %] 99 % (06/12 1308) Last BM Date: 10/04/12 General:   Alert,  Well-developed, thin young African American male ,pleasant and cooperative in NAD Head:  Normocephalic and atraumatic. Eyes:  Sclera clear, no icterus.   Conjunctiva pink. Ears:  Normal auditory acuity. Nose:  No deformity, discharge,  or lesions. Mouth:  No deformity or lesions.   Neck:  Supple; no masses or thyromegaly. Lungs:  Clear throughout to auscultation.   No wheezes, crackles, or rhonchi. Heart:  Regular rate and rhythm; no murmurs, clicks, rubs,  or gallops. Abdomen:  Soft, tender in lower abdomen right greater than left, BS active,nonpalp mass or hsm.   Rectal:  Deferred  Msk:  Symmetrical without gross deformities. . Pulses:  Normal pulses noted. Extremities:  Without clubbing or edema. Multiple tattoos Neurologic:  Alert and  oriented x4;  grossly normal neurologically. Skin:  Intact without significant lesions or rashes.. Psych:  Alert and cooperative. Normal mood and affect.   Lab Results:  Recent Labs  10/05/12 0220  WBC 8.3  HGB 6.8*  HCT 23.2*  PLT 588*    BMET  Recent Labs  10/05/12 0220  NA 134*  K 3.3*  CL 101  CO2 24  GLUCOSE 93  BUN 8  CREATININE 0.75  CALCIUM 9.2   LFT  Recent Labs  10/05/12 0220  PROT 6.9  ALBUMIN 2.9*  AST 19  ALT 13  ALKPHOS 55  BILITOT 0.1*      Studies/Results: Ct Abdomen Pelvis W Contrast  10/05/2012   *RADIOLOGY REPORT*  Clinical Data: Abdominal pain and tightness for 1 week.  White cell count 823.  History of Crohn's and celiac disease.  CT ABDOMEN AND PELVIS WITH CONTRAST  Technique:  Multidetector CT imaging of the abdomen and pelvis was performed following the standard protocol during bolus administration of intravenous contrast.  Contrast: OMNIPAQUE IOHEXOL  300 MG/ML  SOLN  Comparison: 06/22/2012  Findings: The lung bases are clear.  Residual contrast material in the esophagus could be due to dysmotility or reflux versus active swallowing.  Small amount of free fluid in the upper abdomen along the liver edge.  Hounsfield unit measurements are consistent with ascites. The liver, spleen, gallbladder, pancreas, adrenal glands, kidneys, abdominal aorta, and retroperitoneal lymph nodes are unremarkable. The stomach is not abnormally distended.  There is mild dilatation of pelvic small bowel with small bowel wall thickening and mural edema involving the ileum.  Increased density of the wall suggesting enhancement.  Increased density inflammatory tissue in the right lower quadrant mesenteric fat also suggests enhancement. Changes are consistent with active Crohn disease.  Demonstrates some progression since the previous study.  Mesenteric edema. Prominent lymph nodes are present in the mesentery probably representing reactive nodes.  No free air in the abdomen.  Stool filled colon is not abnormally distended.  Pelvis:  Free pelvic fluid consistent with ascites.  Bladder wall is not thickened.  No loculated fluid to suggest abscess.  Appendix is not identified.  IMPRESSION: Pelvic small bowel and  ileal loops demonstrate distension with thickened and edematous wall. Inflammatory infiltration in the right lower quadrant fat.  Reactive mesenteric lymph nodes. Abdominal and pelvic ascites.  Changes are consistent with active Crohn disease.  Demonstrate some progression since previous study.   Original Report Authenticated By: Burman Nieves, M.D.   Dg Abd Acute W/chest  10/05/2012   *RADIOLOGY REPORT*  Clinical Data: Upper abdominal pain and cramping for 48 hours.  ACUTE ABDOMEN SERIES (ABDOMEN 2 VIEW & CHEST 1 VIEW)  Comparison: CT abdomen and pelvis 06/22/2012.  Abdominal series 04/04/2012.  Findings: Slightly shallow inspiration.  The heart size and pulmonary vascularity are normal. The lungs appear clear and expanded without focal air space disease or consolidation. No blunting of the costophrenic angles.  No pneumothorax.  Mediastinal contours appear intact.  Scattered gas and stool in the colon with mild prominence of gas- filled small bowel in the right lower quadrant.  Right lower quadrant small bowel are not distended but demonstrates some evidence of wall thickening.  Changes may be related to inflammatory process consistent with patient's known Crohn disease. No small or large bowel distension.  No free intra-abdominal air. No abnormal air fluid levels.  No radiopaque stones.  Visualized bones appear intact.  IMPRESSION: Focal gas filled small bowel loop in the right lower quadrant with evidence of wall thickening suggesting inflammatory changes due to the patient's Crohn disease.  Nonobstructive bowel gas pattern.  No evidence of active pulmonary disease.   Original Report Authenticated By: Burman Nieves, M.D.    IMPRESSION:  #59  22 year old African American male with Crohn's ileocolitis with exacerbation , and CT showing active disease in the distal small bowel as well as inflammatory infiltration of the right lower quadrant fat. He has history of small bowel obstruction in November of  2013 which resolved with steroids and conservative management. He does not appear to be obstructed currently #2 profound microcytic anemia #3 celiac disease   PLAN: #1 patient has been started on oral prednisone 60 mg by mouth daily and is already feeling better  He is able to tolerate some solid food today He will need a long tapering course of prednisone at discharge, not sure that mesalamine will add much with primarily ileal disease and would not discharge on a mesalamine. #2 iron studies are pending and expect he will need IV  iron infusion and/or long-term iron supplementation #3 discussed importance of strict adherence to a gluten-free diet #4 long discussion with the patient and his girlfriend regarding Crohn's and chronic nature of Crohn's. His main problem is noncompliance with outpatient followup and therefore he is only been treated intermittently with steroids. He would benefit greatly by regular followup with a gastroenterologist and would benefit from immunomodulators and/or biologic therapy but this will not be carried out until he can get established and prove he will followup. He is encouraged to apply for Medicaid which will help with medication management. Unfortunately he has been discharged from [facility] gastroenterology and will either need to attempt to reestablish with Dr. Ewing Schlein.or a gastroenterologist in the local surrounding area   Amy Esterwood  10/05/2012, 11:26 AM     I agree w/ above. Patient was not available when I rounded. Poor social situation and other issues make care difficult. Prednisone is only a short-term solution. ? How dx celiac dz was made - duodenal bxs did not show it in past and I cannot see where celiac Ab's are in chart anywhere.  Iva Boop, MD, [facility] [phone] (pager) 10/05/2012 6:20 PM

## 2012-10-06 DIAGNOSIS — K900 Celiac disease: Secondary | ICD-10-CM

## 2012-10-06 DIAGNOSIS — K5000 Crohn's disease of small intestine without complications: Principal | ICD-10-CM

## 2012-10-06 LAB — TYPE AND SCREEN
ABO/RH(D): A POS
Unit division: 0

## 2012-10-06 LAB — CBC
HCT: 26.5 % — ABNORMAL LOW (ref 39.0–52.0)
MCH: 18.4 pg — ABNORMAL LOW (ref 26.0–34.0)
MCV: 61.6 fL — ABNORMAL LOW (ref 78.0–100.0)
Platelets: 570 10*3/uL — ABNORMAL HIGH (ref 150–400)
RBC: 4.3 MIL/uL (ref 4.22–5.81)

## 2012-10-06 LAB — BASIC METABOLIC PANEL
BUN: 6 mg/dL (ref 6–23)
CO2: 26 mEq/L (ref 19–32)
Calcium: 9.3 mg/dL (ref 8.4–10.5)
Creatinine, Ser: 0.71 mg/dL (ref 0.50–1.35)
Glucose, Bld: 90 mg/dL (ref 70–99)

## 2012-10-06 MED ORDER — PREDNISONE 10 MG PO TABS: ORAL_TABLET | ORAL | Status: DC

## 2012-10-06 MED ORDER — HYDROCODONE-ACETAMINOPHEN 5-300 MG PO TABS: 1.0000 | ORAL_TABLET | Freq: Four times a day (QID) | ORAL | Status: DC | PRN

## 2012-10-06 MED ORDER — FERROUS SULFATE 325 (65 FE) MG PO TABS: 325.0000 mg | ORAL_TABLET | Freq: Two times a day (BID) | ORAL | Status: DC

## 2012-10-06 NOTE — Progress Notes (Signed)
Patient ID: Shane Klein, male   DOB: [birth year], 22 y.o.   MRN: [id number] [facility] Progress Note  Subjective: Feels much better- eating without difficulty, thinks he can manage without pain meds  At  Home Pt says celiac was dx by Dr. Russella Dar  By labs and bx.. Pt will be applying for Medicaid- he voices understanding  of his situation and need to establish with another GI group, and importance of keeping appts  Etc ,so he can get the care he needs.  Objective:  Vital signs in last 24 hours: Temp:  [97.6 F (36.4 C)-98.4 F (36.9 C)] 98.1 F (36.7 C) (06/13 0502) Pulse Rate:  [65-91] 65 (06/13 0502) Resp:  [16-18] 18 (06/13 0502) BP: (111-142)/(62-86) 136/86 mmHg (06/13 0502) SpO2:  [97 %-99 %] 97 % (06/13 0502) Last BM Date: 10/05/12 General:   Alert,  Well-developed, AA male   in NAD Heart:  Regular rate and rhythm; no murmurs Pulm;clear Abdomen:  Soft,  Minimally tender and nondistended. Normal bowel sounds, without guarding, and without rebound.   Extremities:  Without edema. Neurologic:  Alert and  oriented x4;  grossly normal neurologically. Psych:  Alert and cooperative. Normal mood and affect.  Intake/Output from previous day: 06/12 0701 - 06/13 0700 In: 1903.8 [I.V.:1585; Blood:318.8] Out: -  Intake/Output this shift:    Lab Results:  Recent Labs  10/05/12 0220 10/05/12 1613 10/06/12 0515  WBC 8.3  --  6.8  HGB 6.8* 8.1* 7.9*  HCT 23.2* 26.3* 26.5*  PLT 588*  --  570*   BMET  Recent Labs  10/05/12 0220 10/06/12 0515  NA 134* 139  K 3.3* 3.7  CL 101 107  CO2 24 26  GLUCOSE 93 90  BUN 8 6  CREATININE 0.75 0.71  CALCIUM 9.2 9.3   LFT  Recent Labs  10/05/12 0220  PROT 6.9  ALBUMIN 2.9*  AST 19  ALT 13  ALKPHOS 55  BILITOT 0.1*     Assessment / Plan: #1  22 yo male with Crohns ileocolitis with acute exacerbation ,and long segment involvement of diatl small bowel on CT. Improved on prednisone Ok for discharge home today-  would give him a long tapered course of prednisone- 40 mg daily x 2 weeks, then 30 mg daily x 2 weeks, then 20 mg daily x 2 weeks, then 10 mg daily x 2 weeks- hopefully this will also allow him time to get established with a Gi MD, and Primary  MD. #2 iron deficiency anemia-  Continue oral iron  #3 Celiac disease? -small bowel bx's negative in 2010, did have duodenitis -possible Crohns involvement- he had an elevated Gliadin Ab level     LOS: 1 day   Amy Esterwood  10/06/2012, 9:43 AM   Agree with Ms. Oswald Hillock assessment and plan. Note that elevated gliadin Ab is much less specific than TTG or endomysial Ab's Iva Boop, MD, [facility]

## 2012-10-06 NOTE — Progress Notes (Signed)
CARE MANAGEMENT NOTE 10/06/2012  Patient:  MALCOM, SELMER   Account Number:  [id number]  Date Initiated:  10/06/2012  Documentation initiated by:  [facility]  Subjective/Objective Assessment:   22 year old male admitted with crohn's flare.     Action/Plan:   From home with girlfriend.   Anticipated DC Date:  10/06/2012   Anticipated DC Plan:  HOME/SELF CARE    DC Planning Services  CM consult        Status of service:   Medicare Important Message given?  NA - LOS <3 / Initial given by admissions (If response is "NO", the following Medicare IM given date fields will be blank)   Discharge Disposition:  Home  Per UR Regulation:  Reviewed for med. necessity/level of care/duration of stay   Comments:  10/06/12 Algernon Huxley RN BSN Met with pt to discuss assistance with medications. Pt's d/c meds include Prednisone that is $4.00 at [facility], Ferrous Sulfate and Vicodin that cannot be filled through the [facility] program. I went over cost of medications to the pt who stated he culd afford to buy them out of pocket. I also gave him information on [facility] so that he can establish care with them.

## 2012-10-06 NOTE — Progress Notes (Signed)
Patient discharge home with girlfriend, alert and oriented, discharge orders given, patient verbalize understanding of discharge instructions given, My Chart access refused at this time, will access later, patient in stable condition at this time

## 2012-10-06 NOTE — Discharge Summary (Signed)
Physician Discharge Summary  Shane Klein:[id number] DOB: 29-Mar-1991 DOA: 10/05/2012  PCP: No primary provider on file.  Admit date: 10/05/2012 Discharge date: 10/06/2012  Time spent: 35 minutes  Recommendations for Outpatient Follow-up:  1. Needs to follow up with Dr Watt Climes for further care.   Discharge Diagnoses:  Principal Problem:   Crohn's disease of ileum Active Problems:   ANEMIA-IRON DEFICIENCY   CROHN'S DISEASE-LARGE & SMALL INTESTINE   Discharge Condition: stable.  Diet recommendation: Gluten free diet.   There were no vitals filed for this visit.  History of present illness:  Shane Klein is a 22 y.o. male with a known history of celiac and Crohn's disease with pmh of obstruction who presents to the ED with c/o diffuse, severe abdominal pain which has been worsening over the past 7 days. Patient does not see a GI doc on a routine basis due to lack of insurance, patient had been seen in the ED 9 days ago and prescribed to start mesalamine but he admits that he didn't / wasn't able to get this filled. Unfortunately since his last visit in 02/2012 the patient has just been coming to the ED on a PRN basis when his Chron's disease flares up and this dosent seem to be working well.   Hospital Course:  1-Crohn's disease acute exacerbation: Patient was treated with IV fluids, oral prednisone. CT scan show active chron diseases. Dr Carlean Purl saw patient in consultation. GI recommend prednisone taper for weeks. Will provide prescription. Patient will take prednisone 40 mg daily for 2 weeks, then 30 mg daily for 2 weeks then 20 mg daily for 2 weeks then 10 mg daily. Patient will need to follow up with Dr Watt Climes outpatient. Patient symptoms improved, abdominal pain improved. He was tolerating diet.   2-Microcytic hypochromic anemia: Iron less than 10 and ferritin at 17 . Patient received  1 unit of PRBC. He will be discharge on iron tablet.     Procedures:  none  Consultations:  [facility]  Discharge Exam: Filed Vitals:   10/05/12 1405 10/05/12 1414 10/05/12 2113 10/06/12 0502  BP: 116/69 140/71 137/78 136/86  Pulse: 83 91 78 65  Temp: 98.4 F (36.9 C) 98 F (36.7 C) 98.1 F (36.7 C) 98.1 F (36.7 C)  TempSrc: Oral Axillary Oral Oral  Resp: 18 18 18 18   SpO2: 99%  98% 97%    General: No distress.  Cardiovascular: S 1, S 2 RRR Respiratory: CTA  Discharge Instructions  Discharge Orders   Future Orders Complete By Expires     Increase activity slowly  As directed         Medication List    STOP taking these medications       ibuprofen 200 MG tablet  Commonly known as:  ADVIL,MOTRIN     mesalamine 400 MG EC tablet  Commonly known as:  ASACOL      TAKE these medications       ferrous sulfate 325 (65 FE) MG tablet  Take 1 tablet (325 mg total) by mouth 2 (two) times daily with a meal.     Hydrocodone-Acetaminophen 5-300 MG Tabs  Take 1 tablet by mouth every 6 (six) hours as needed.     ondansetron 4 MG tablet  Commonly known as:  ZOFRAN  Take 1 tablet (4 mg total) by mouth every 6 (six) hours.     predniSONE 10 MG tablet  Commonly known as:  DELTASONE  Take 4 tablet daily for 2  weeks, the 3 tablets daily for 2 weeks, then 2 tablet daily for 2 weeks the take 1 tablet daily for 2 weeks.       Allergies  Allergen Reactions  . Gluten Meal Other (See Comments)    Stomach pain  . Wheat Bran Other (See Comments)    Belly pain       Follow-up Information   Follow up with [facility], MD. (Dr Watt Climes office will call you with an appointment. )    Contact information:   [address] [place], [place] [address] [phone]        The results of significant diagnostics from this hospitalization (including imaging, microbiology, ancillary and laboratory) are listed below for reference.    Significant Diagnostic Studies: Ct Abdomen Pelvis  W Contrast  10/05/2012   *RADIOLOGY REPORT*  Clinical Data: Abdominal pain and tightness for 1 week.  White cell count 823.  History of Crohn's and celiac disease.  CT ABDOMEN AND PELVIS WITH CONTRAST  Technique:  Multidetector CT imaging of the abdomen and pelvis was performed following the standard protocol during bolus administration of intravenous contrast.  Contrast: 123m OMNIPAQUE IOHEXOL 300 MG/ML  SOLN  Comparison: 06/22/2012  Findings: The lung bases are clear.  Residual contrast material in the esophagus could be due to dysmotility or reflux versus active swallowing.  Small amount of free fluid in the upper abdomen along the liver edge.  Hounsfield unit measurements are consistent with ascites. The liver, spleen, gallbladder, pancreas, adrenal glands, kidneys, abdominal aorta, and retroperitoneal lymph nodes are unremarkable. The stomach is not abnormally distended.  There is mild dilatation of pelvic small bowel with small bowel wall thickening and mural edema involving the ileum.  Increased density of the wall suggesting enhancement.  Increased density inflammatory tissue in the right lower quadrant mesenteric fat also suggests enhancement. Changes are consistent with active Crohn disease.  Demonstrates some progression since the previous study.  Mesenteric edema. Prominent lymph nodes are present in the mesentery probably representing reactive nodes.  No free air in the abdomen.  Stool filled colon is not abnormally distended.  Pelvis:  Free pelvic fluid consistent with ascites.  Bladder wall is not thickened.  No loculated fluid to suggest abscess.  Appendix is not identified.  IMPRESSION: Pelvic small bowel and ileal loops demonstrate distension with thickened and edematous wall. Inflammatory infiltration in the right lower quadrant fat.  Reactive mesenteric lymph nodes. Abdominal and pelvic ascites.  Changes are consistent with active Crohn disease.  Demonstrate some progression since previous  study.   Original Report Authenticated By: WLucienne Capers M.D.   Dg Abd Acute W/chest  10/05/2012   *RADIOLOGY REPORT*  Clinical Data: Upper abdominal pain and cramping for 48 hours.  ACUTE ABDOMEN SERIES (ABDOMEN 2 VIEW & CHEST 1 VIEW)  Comparison: CT abdomen and pelvis 06/22/2012.  Abdominal series 04/04/2012.  Findings: Slightly shallow inspiration.  The heart size and pulmonary vascularity are normal. The lungs appear clear and expanded without focal air space disease or consolidation. No blunting of the costophrenic angles.  No pneumothorax.  Mediastinal contours appear intact.  Scattered gas and stool in the colon with mild prominence of gas- filled small bowel in the right lower quadrant.  Right lower quadrant small bowel are not distended but demonstrates some evidence of wall thickening.  Changes may  be related to inflammatory process consistent with patient's known Crohn disease. No small or large bowel distension.  No free intra-abdominal air. No abnormal air fluid levels.  No radiopaque stones.  Visualized bones appear intact.  IMPRESSION: Focal gas filled small bowel loop in the right lower quadrant with evidence of wall thickening suggesting inflammatory changes due to the patient's Crohn disease.  Nonobstructive bowel gas pattern.  No evidence of active pulmonary disease.   Original Report Authenticated By: Lucienne Capers, M.D.   Dg Abd Acute W/chest  10/02/2012   *RADIOLOGY REPORT*  Clinical Data: Lower abdominal pain, diarrhea, history Crohn's disease, question perforation, history smoking  ACUTE ABDOMEN SERIES (ABDOMEN 2 VIEW & CHEST 1 VIEW)  Comparison: 04/04/2012  Findings: Normal heart size, mediastinal contours, and pulmonary vascularity. Minimal chronic peribronchial thickening. Lungs clear. No pleural effusion or pneumothorax. Upper thoracic levoconvex scoliosis again noted.  Nonobstructive bowel gas pattern. Single loop of minimally prominent small bowel noted in left upper quadrant.  Gas identified within colon. No definite bowel wall thickening or free intraperitoneal air. Few nonspecific calcifications in pelvis.  IMPRESSION: Nonspecific bowel gas pattern. Minimal chronic bronchitic changes.   Original Report Authenticated By: Lavonia Dana, M.D.    Microbiology: No results found for this or any previous visit (from the past 240 hour(s)).   Labs: Basic Metabolic Panel:  Recent Labs Lab 10/02/12 1122 10/05/12 0220 10/06/12 0515  NA 140 134* 139  K 3.5 3.3* 3.7  CL 107 101 107  CO2 27 24 26   GLUCOSE 91 93 90  BUN 14 8 6   CREATININE 0.83 0.75 0.71  CALCIUM 9.0 9.2 9.3   Liver Function Tests:  Recent Labs Lab 10/02/12 1122 10/05/12 0220  AST 20 19  ALT 15 13  ALKPHOS 52 55  BILITOT 0.1* 0.1*  PROT 6.6 6.9  ALBUMIN 2.7* 2.9*    Recent Labs Lab 10/02/12 1122  LIPASE 17   No results found for this basename: AMMONIA,  in the last 168 hours CBC:  Recent Labs Lab 10/02/12 1122 10/05/12 0220 10/05/12 1613 10/06/12 0515  WBC 6.3 8.3  --  6.8  NEUTROABS 3.6 5.0  --   --   HGB 7.4* 6.8* 8.1* 7.9*  HCT 24.2* 23.2* 26.3* 26.5*  MCV 60.8* 59.9*  --  61.6*  PLT 582* 588*  --  570*   Cardiac Enzymes: No results found for this basename: CKTOTAL, CKMB, CKMBINDEX, TROPONINI,  in the last 168 hours BNP: BNP (last 3 results)  Recent Labs  03/13/12 1847  PROBNP 462.6*   CBG: No results found for this basename: GLUCAP,  in the last 168 hours     Signed:  Dejon Lukas  [facility] 10/06/2012, 2:14 PM

## 2012-10-07 ENCOUNTER — Emergency Department (HOSPITAL_COMMUNITY)
Admission: EM | Admit: 2012-10-07 | Discharge: 2012-10-07 | Disposition: A | Discharge status: Home / Self Care | Payer: Medicaid Other | Attending: Emergency Medicine | Admitting: Emergency Medicine

## 2012-10-07 ENCOUNTER — Encounter (HOSPITAL_COMMUNITY): Payer: Self-pay

## 2012-10-07 DIAGNOSIS — Z87891 Personal history of nicotine dependence: Secondary | ICD-10-CM | POA: Insufficient documentation

## 2012-10-07 DIAGNOSIS — R109 Unspecified abdominal pain: Secondary | ICD-10-CM

## 2012-10-07 DIAGNOSIS — D509 Iron deficiency anemia, unspecified: Secondary | ICD-10-CM | POA: Insufficient documentation

## 2012-10-07 DIAGNOSIS — Z79899 Other long term (current) drug therapy: Secondary | ICD-10-CM | POA: Insufficient documentation

## 2012-10-07 DIAGNOSIS — Z8719 Personal history of other diseases of the digestive system: Secondary | ICD-10-CM | POA: Insufficient documentation

## 2012-10-07 DIAGNOSIS — Z8709 Personal history of other diseases of the respiratory system: Secondary | ICD-10-CM | POA: Insufficient documentation

## 2012-10-07 DIAGNOSIS — R1084 Generalized abdominal pain: Secondary | ICD-10-CM | POA: Insufficient documentation

## 2012-10-07 MED ORDER — PREDNISONE 20 MG PO TABS
60.0000 mg | ORAL_TABLET | Freq: Once | ORAL | Status: AC
  Administered 2012-10-07: 60 mg via ORAL
  Filled 2012-10-07: qty 3

## 2012-10-07 MED ORDER — ONDANSETRON 8 MG PO TBDP
8.0000 mg | ORAL_TABLET | Freq: Once | ORAL | Status: AC
  Administered 2012-10-07: 8 mg via ORAL
  Filled 2012-10-07: qty 1

## 2012-10-07 MED ORDER — HYDROCODONE-ACETAMINOPHEN 5-325 MG PO TABS
2.0000 | ORAL_TABLET | Freq: Once | ORAL | Status: AC
  Administered 2012-10-07: 2 via ORAL
  Filled 2012-10-07: qty 2

## 2012-10-07 NOTE — ED Provider Notes (Signed)
History     CSN: [id number]  Arrival date & time 10/07/12  0217   First MD Initiated Contact with Patient 10/07/12 0232      Chief Complaint  Patient presents with  . Abdominal Pain    (Consider location/radiation/quality/duration/timing/severity/associated sxs/prior treatment) HPI Comments: Patient is a 22 year old male with a history of Crohn's disease and celiac disease who presents for a generalized tightening sensation in his abdomen. Patient states that symptoms have been worsening since yesterday after discharge from hospital for same complaint. Patient was discharged with instruction to followup with Dr. Ewing Schlein and to begin prednisone taper. Given Rx for Norco to take as needed for pain. Patient states that he has been unable to fill his prescriptions secondary to monetary issues. Patient denies any aggravating or alleviating factors of his symptoms. He denies fevers, lightheadedness or dizziness, syncope, shortness of breath, chest pain, nausea, vomiting, diarrhea, urinary symptoms, and numbness or tingling in his extremities. Patient also diagnosed with microcytic hypochromic anemia during hospital stay and was transfused with 1 unit PRBCs. D/c'd 12 hours ago. Last BM yesterday which was normal in color and consistency.  Patient is a 22 y.o. male presenting with abdominal pain. The history is provided by the patient. No language interpreter was used.  Abdominal Pain Associated symptoms include abdominal pain ("tightening"). Pertinent negatives include no chest pain, fever, nausea, numbness or vomiting.    Past Medical History  Diagnosis Date  . 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. Celiac disease   . Crohn's disease with intestinal obstruction 03/01/2012  . Iron deficiency anemia   . GERD (gastroesophageal reflux disease)     History reviewed. No pertinent past surgical history.  Family History  Problem Relation Age of Onset  . Diabetes Maternal Aunt   . Diabetes Maternal Grandmother      History  Substance Use Topics  . Smoking status: Former Smoker    Types: Cigarettes    Quit date: 03/01/2010  . Smokeless tobacco: Never Used  . Alcohol Use: No     Comment: rarely     Review of Systems  Constitutional: Negative for fever.  Respiratory: Negative for shortness of breath.   Cardiovascular: Negative for chest pain.  Gastrointestinal: Positive for abdominal pain ("tightening"). Negative for nausea, vomiting, diarrhea and constipation.  Genitourinary: Negative for dysuria and hematuria.  Skin: Negative for color change and pallor.  Neurological: Negative for dizziness, light-headedness and numbness.  All other systems reviewed and are negative.    Allergies  Gluten meal and Wheat bran  Home Medications   Current Outpatient Rx  Name  Route  Sig  Dispense  Refill  . ferrous sulfate 325 (65 FE) MG tablet   Oral   Take 1 tablet (325 mg total) by mouth 2 (two) times daily with a meal.   60 tablet   3   . Hydrocodone-Acetaminophen 5-300 MG TABS   Oral   Take 1 tablet by mouth every 6 (six) hours as needed (pain).         . ondansetron (ZOFRAN) 4 MG tablet   Oral   Take 1 tablet (4 mg total) by mouth every 6 (six) hours.   12 tablet   0   . predniSONE (DELTASONE) 10 MG tablet      Take 4 tablet daily for 2 weeks, the 3 tablets daily for 2 weeks, then 2 tablet daily for 2 weeks the take 1 tablet daily for 2 weeks.   140 tablet   0  BP 139/60  Pulse 81  Temp(Src) 99.4 F (37.4 C) (Oral)  Resp 18  Wt 170 lb (77.111 kg)  BMI 23.72 kg/m2  SpO2 98%  Physical Exam  Nursing note and vitals reviewed. Constitutional: He is oriented to person, place, and time. He appears well-developed and well-nourished. No distress.  HENT:  Head: Normocephalic and atraumatic.  Mouth/Throat: Oropharynx is clear and moist. No oropharyngeal exudate.  Eyes: Conjunctivae and EOM are normal. Pupils are equal, round, and reactive to light. No scleral icterus.   Neck: Normal range of motion. Neck supple.  Cardiovascular: Normal rate, regular rhythm, normal heart sounds and intact distal pulses.   Pulmonary/Chest: Effort normal and breath sounds normal. No respiratory distress. He has no wheezes. He has no rales. He exhibits no tenderness.  Abdominal: Soft. Bowel sounds are normal. He exhibits no distension and no mass. There is tenderness (mild, generalized). There is no rebound and no guarding.  No peritoneal signs; no murphy's sign or tenderness at McBurney's point.  Musculoskeletal: Normal range of motion. He exhibits no edema.  Lymphadenopathy:    He has no cervical adenopathy.  Neurological: He is alert and oriented to person, place, and time.  Skin: Skin is warm and dry. No rash noted. He is not diaphoretic. No erythema. No pallor.  Psychiatric: He has a normal mood and affect. His behavior is normal.    ED Course  Procedures (including critical care time)  Labs Reviewed - No data to display Ct Abdomen Pelvis W Contrast  10/05/2012   *RADIOLOGY REPORT*  Clinical Data: Abdominal pain and tightness for 1 week.  White cell count 823.  History of Crohn's and celiac disease.  CT ABDOMEN AND PELVIS WITH CONTRAST  Technique:  Multidetector CT imaging of the abdomen and pelvis was performed following the standard protocol during bolus administration of intravenous contrast.  Contrast: OMNIPAQUE IOHEXOL 300 MG/ML  SOLN  Comparison: 06/22/2012  Findings: The lung bases are clear.  Residual contrast material in the esophagus could be due to dysmotility or reflux versus active swallowing.  Small amount of free fluid in the upper abdomen along the liver edge.  Hounsfield unit measurements are consistent with ascites. The liver, spleen, gallbladder, pancreas, adrenal glands, kidneys, abdominal aorta, and retroperitoneal lymph nodes are unremarkable. The stomach is not abnormally distended.  There is mild dilatation of pelvic small bowel with small bowel  wall thickening and mural edema involving the ileum.  Increased density of the wall suggesting enhancement.  Increased density inflammatory tissue in the right lower quadrant mesenteric fat also suggests enhancement. Changes are consistent with active Crohn disease.  Demonstrates some progression since the previous study.  Mesenteric edema. Prominent lymph nodes are present in the mesentery probably representing reactive nodes.  No free air in the abdomen.  Stool filled colon is not abnormally distended.  Pelvis:  Free pelvic fluid consistent with ascites.  Bladder wall is not thickened.  No loculated fluid to suggest abscess.  Appendix is not identified.  IMPRESSION: Pelvic small bowel and ileal loops demonstrate distension with thickened and edematous wall. Inflammatory infiltration in the right lower quadrant fat.  Reactive mesenteric lymph nodes. Abdominal and pelvic ascites.  Changes are consistent with active Crohn disease.  Demonstrate some progression since previous study.   Original Report Authenticated By: Burman Nieves, M.D.     1. Abdominal pain      MDM  Patient is a 22 year old male with a history of Crohn's disease and celiac disease who presents for  an abdominal tightening sensation that has been worsening since hospital d/c 12 hours ago. Patient states that he doesn't have the money to fill his prescriptions for symptom management. Physical exam significant for mild, generalized abdominal tenderness to palpation without palpable masses or peritoneal signs. No signs of acute abdomen; no Murphy's sign or tenderness at McBurney's point. Patient denies any change in symptoms since onset on 10/02/12 and endorses PO food and fluid intake today. Do not believe further workup with labs or imaging is warranted at this time. Will treat with PO pain meds, zofran, and prednisone and continue to monitor.  Patient endorses complete relief of symptoms with norco, zofran and prednisone. No evidence of  worsening abdominal tenderness on reexamination. Patient claims difficulty paying for meds prescribed at discharge yesterday, but was seen by social work while in hospital; stated at this time that he had no trouble acquiring prescriptions. Have discussed with patient that treatment with recommended regimen is necessary for symptom improvement or symptoms will continue to persist. Indications for ED return discussed. Patient verbalizes comfort and understanding with plan with no unaddressed concerns.      Antony Madura, PA-C 10/07/12 [phone]

## 2012-10-07 NOTE — ED Provider Notes (Signed)
Medical screening examination/treatment/procedure(s) were performed by non-physician practitioner and as supervising physician I was immediately available for consultation/collaboration.  Olivia Mackie, MD 10/07/12 [phone]

## 2012-10-07 NOTE — ED Notes (Signed)
Pt discharged from [facility] yesterday. Pt c/o of generalized abdomen pain. States that his stomach is "real tight". Pt denies n/v/d. Pt has hx of crohns disease and celiac disease.

## 2012-11-13 ENCOUNTER — Encounter (HOSPITAL_COMMUNITY): Payer: Self-pay

## 2012-11-13 ENCOUNTER — Emergency Department (HOSPITAL_COMMUNITY)
Admission: EM | Admit: 2012-11-13 | Discharge: 2012-11-13 | Disposition: A | Discharge status: Home / Self Care | Payer: Medicaid Other | Attending: Emergency Medicine | Admitting: Emergency Medicine

## 2012-11-13 ENCOUNTER — Encounter: Payer: Self-pay | Admitting: Internal Medicine

## 2012-11-13 DIAGNOSIS — R1032 Left lower quadrant pain: Secondary | ICD-10-CM | POA: Insufficient documentation

## 2012-11-13 DIAGNOSIS — R197 Diarrhea, unspecified: Secondary | ICD-10-CM | POA: Insufficient documentation

## 2012-11-13 DIAGNOSIS — K5090 Crohn's disease, unspecified, without complications: Secondary | ICD-10-CM | POA: Insufficient documentation

## 2012-11-13 DIAGNOSIS — K5289 Other specified noninfective gastroenteritis and colitis: Secondary | ICD-10-CM | POA: Insufficient documentation

## 2012-11-13 DIAGNOSIS — K5010 Crohn's disease of large intestine without complications: Secondary | ICD-10-CM

## 2012-11-13 DIAGNOSIS — Z8719 Personal history of other diseases of the digestive system: Secondary | ICD-10-CM | POA: Insufficient documentation

## 2012-11-13 DIAGNOSIS — Z862 Personal history of diseases of the blood and blood-forming organs and certain disorders involving the immune mechanism: Secondary | ICD-10-CM | POA: Insufficient documentation

## 2012-11-13 DIAGNOSIS — R110 Nausea: Secondary | ICD-10-CM | POA: Insufficient documentation

## 2012-11-13 DIAGNOSIS — F17200 Nicotine dependence, unspecified, uncomplicated: Secondary | ICD-10-CM | POA: Insufficient documentation

## 2012-11-13 LAB — CBC WITH DIFFERENTIAL/PLATELET
Basophils Absolute: 0 10*3/uL (ref 0.0–0.1)
Basophils Relative: 0 % (ref 0–1)
Eosinophils Absolute: 0.7 10*3/uL (ref 0.0–0.7)
Eosinophils Relative: 6 % — ABNORMAL HIGH (ref 0–5)
HCT: 27.3 % — ABNORMAL LOW (ref 39.0–52.0)
Lymphocytes Relative: 14 % (ref 12–46)
MCH: 20.1 pg — ABNORMAL LOW (ref 26.0–34.0)
MCHC: 30.4 g/dL (ref 30.0–36.0)
MCV: 66.3 fL — ABNORMAL LOW (ref 78.0–100.0)
Monocytes Absolute: 1 10*3/uL (ref 0.1–1.0)
Platelets: 433 10*3/uL — ABNORMAL HIGH (ref 150–400)
RDW: 22.4 % — ABNORMAL HIGH (ref 11.5–15.5)

## 2012-11-13 LAB — URINALYSIS, ROUTINE W REFLEX MICROSCOPIC
Bilirubin Urine: NEGATIVE
Glucose, UA: NEGATIVE mg/dL
Hgb urine dipstick: NEGATIVE
Ketones, ur: NEGATIVE mg/dL
Protein, ur: NEGATIVE mg/dL
Urobilinogen, UA: 1 mg/dL (ref 0.0–1.0)

## 2012-11-13 LAB — COMPREHENSIVE METABOLIC PANEL
AST: 11 U/L (ref 0–37)
CO2: 28 mEq/L (ref 19–32)
Calcium: 8.9 mg/dL (ref 8.4–10.5)
Creatinine, Ser: 0.87 mg/dL (ref 0.50–1.35)
GFR calc non Af Amer: >90 mL/min (ref >90)
Total Protein: 6.7 g/dL (ref 6.0–8.3)

## 2012-11-13 MED ORDER — PREDNISONE 10 MG PO TABS: 20.0000 mg | ORAL_TABLET | Freq: Every day | ORAL | Status: DC

## 2012-11-13 MED ORDER — MORPHINE SULFATE 4 MG/ML IJ SOLN
4.0000 mg | Freq: Once | INTRAMUSCULAR | Status: AC
  Administered 2012-11-13: 4 mg via INTRAVENOUS
  Filled 2012-11-13: qty 1

## 2012-11-13 MED ORDER — HYDROCODONE-ACETAMINOPHEN 5-325 MG PO TABS: 1.0000 | ORAL_TABLET | ORAL | Status: DC | PRN

## 2012-11-13 MED ORDER — SODIUM CHLORIDE 0.9 % IV BOLUS (SEPSIS)
1000.0000 mL | Freq: Once | INTRAVENOUS | Status: AC
  Administered 2012-11-13: 1000 mL via INTRAVENOUS

## 2012-11-13 MED ORDER — PREDNISONE 20 MG PO TABS
20.0000 mg | ORAL_TABLET | Freq: Once | ORAL | Status: AC
  Administered 2012-11-13: 20 mg via ORAL
  Filled 2012-11-13: qty 1

## 2012-11-13 MED ORDER — ONDANSETRON HCL 4 MG/2ML IJ SOLN
4.0000 mg | Freq: Once | INTRAMUSCULAR | Status: AC
  Administered 2012-11-13: 4 mg via INTRAVENOUS
  Filled 2012-11-13: qty 2

## 2012-11-13 MED ORDER — PROMETHAZINE HCL 25 MG PO TABS: 25.0000 mg | ORAL_TABLET | Freq: Four times a day (QID) | ORAL | Status: DC | PRN

## 2012-11-13 NOTE — ED Provider Notes (Signed)
History    CSN: [id number] Arrival date & time 11/13/12  1020  First MD Initiated Contact with Patient 11/13/12 1030     Chief Complaint  Patient presents with  . Abdominal Pain   (Consider location/radiation/quality/duration/timing/severity/associated sxs/prior Treatment) HPI Comments: Shane Klein is a 22 y.o. Male presenting with increased abdominal pain and non bloody diarrhea for the past 2 days.  His pain is predominantly right sided and states is consistent with his crohns disease,  Particularly since he ran out of his prednisone 3 days ago which he was prescribed during a hospitalization at [facility] last month.  His abdomen feels "tight".  He is able to eat,  Although has had decreased intake,  Last ate yesterday evening.  He has seen Dr. Russella Dar for his crohn's in the past, has not in over 2 years due to loss of insurance.   He denies fevers, chills,  Has had nausea without vomiting and has been able to tolerate PO fluids and solid food,  Last ate a meal yesterday evening. He also reports history of celiac disease, but states his symptoms seem      The history is provided by the patient.   Past Medical History  Diagnosis Date  . 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. Celiac disease   . Crohn's disease with intestinal obstruction 03/01/2012  . Iron deficiency anemia   . GERD (gastroesophageal reflux disease)    History reviewed. No pertinent past surgical history. Family History  Problem Relation Age of Onset  . Diabetes Maternal Aunt   . Diabetes Maternal Grandmother    History  Substance Use Topics  . Smoking status: Current Every Day Smoker    Types: Cigarettes    Last Attempt to Quit: 03/01/2010  . Smokeless tobacco: Never Used  . Alcohol Use: No     Comment: rarely    Review of Systems  Constitutional: Negative for fever and chills.  HENT: Negative for congestion, sore throat and neck pain.   Eyes: Negative.   Respiratory: Negative for chest tightness and shortness of breath.    Cardiovascular: Negative for chest pain.  Gastrointestinal: Positive for abdominal pain, diarrhea and abdominal distention. Negative for nausea, vomiting and blood in stool.  Genitourinary: Negative.   Musculoskeletal: Negative for joint swelling and arthralgias.  Skin: Negative.  Negative for rash and wound.  Neurological: Negative for dizziness, weakness, light-headedness, numbness and headaches.  Psychiatric/Behavioral: Negative.     Allergies  Gluten meal and Wheat bran  Home Medications   Current Outpatient Rx  Name  Route  Sig  Dispense  Refill  . predniSONE (DELTASONE) 10 MG tablet      Take 4 tablet daily for 2 weeks, the 3 tablets daily for 2 weeks, then 2 tablet daily for 2 weeks the take 1 tablet daily for 2 weeks.   140 tablet   0   . predniSONE (DELTASONE) 10 MG tablet   Oral   Take 2 tablets (20 mg total) by mouth daily.   28 tablet   0    BP 129/64  Pulse 77  Temp(Src) 98.7 F (37.1 C) (Oral)  Resp 20  Ht 5\' 11"  (1.803 m)  Wt 170 lb (77.111 kg)  BMI 23.72 kg/m2  SpO2 97% Physical Exam  Nursing note and vitals reviewed. Constitutional: He appears well-developed and well-nourished.  HENT:  Head: Normocephalic and atraumatic.  Eyes: Conjunctivae are normal.  Neck: Normal range of motion.  Cardiovascular: Normal rate, regular rhythm, normal heart sounds and intact  distal pulses.   Pulmonary/Chest: Effort normal and breath sounds normal. He has no wheezes.  Abdominal: Soft. Bowel sounds are normal. He exhibits no distension and no mass. There is no hepatosplenomegaly. There is tenderness in the left lower quadrant. There is no rebound, no guarding and no CVA tenderness.  Increased tympany throughout abdomen.   Musculoskeletal: Normal range of motion.  Neurological: He is alert.  Skin: Skin is warm and dry.  Psychiatric: He has a normal mood and affect.    ED Course  Procedures (including critical care time) Labs Reviewed  CBC WITH DIFFERENTIAL -  Abnormal; Notable for the following:    WBC 11.5 (*)    RBC 4.12 (*)    Hemoglobin 8.3 (*)    HCT 27.3 (*)    MCV 66.3 (*)    MCH 20.1 (*)    RDW 22.4 (*)    Platelets 433 (*)    Neutro Abs 8.2 (*)    Eosinophils Relative 6 (*)    All other components within normal limits  COMPREHENSIVE METABOLIC PANEL - Abnormal; Notable for the following:    Albumin 3.0 (*)    Total Bilirubin 0.2 (*)    All other components within normal limits  URINALYSIS, ROUTINE W REFLEX MICROSCOPIC  LIPASE, BLOOD   No results found. 1. Crohn's colitis, without complications     MDM  Spoke with Dr. Jena Gauss who suggested placing pt back on prednisone 20 mg qd,  Will follow up with pt in office within the next 1-2 weeks.  Pt understands plan.  Also knows to return here sooner for any worsened sx, fever,  Increased abd pain, weakness, etc.    Spoke with Dr. Fonnie Jarvis prior to pt dispo home.  He was comfortable at time of dc,  No distress.  VSS, repeat abd exam,  Nonacute.  No guarding.  Burgess Amor, PA-C 11/13/12 1434

## 2012-11-13 NOTE — ED Notes (Signed)
Patient provided ice chips. States pain /nausea is better

## 2012-11-13 NOTE — ED Notes (Signed)
Patient with no complaints at this time. Respirations even and unlabored. Skin warm/dry. Discharge instructions reviewed with patient at this time. Patient given opportunity to voice concerns/ask questions. IV removed per policy and band-aid applied to site. Patient discharged at this time and left Emergency Department with steady gait.  

## 2012-11-13 NOTE — ED Notes (Signed)
Pt c/o abd pain with diarrhea x 2 days.

## 2012-11-14 ENCOUNTER — Telehealth: Payer: Self-pay | Admitting: Internal Medicine

## 2012-11-14 NOTE — Telephone Encounter (Signed)
RMR called the office yesterday to see if we could get this patient scheduled an OV within the next week or so. I have attempted to call the patient but keep getting his VM. I LMOM that patient has been scheduled an OV with [facility] on 7/28 at 130 and also mailed him a letter with an appt card.

## 2012-11-15 ENCOUNTER — Emergency Department (HOSPITAL_COMMUNITY)
Admission: EM | Admit: 2012-11-15 | Discharge: 2012-11-15 | Disposition: A | Discharge status: Home / Self Care | Payer: Medicaid Other | Attending: Emergency Medicine | Admitting: Emergency Medicine

## 2012-11-15 ENCOUNTER — Encounter (HOSPITAL_COMMUNITY): Payer: Self-pay

## 2012-11-15 DIAGNOSIS — Z8719 Personal history of other diseases of the digestive system: Secondary | ICD-10-CM | POA: Insufficient documentation

## 2012-11-15 DIAGNOSIS — Z8709 Personal history of other diseases of the respiratory system: Secondary | ICD-10-CM | POA: Insufficient documentation

## 2012-11-15 DIAGNOSIS — R109 Unspecified abdominal pain: Secondary | ICD-10-CM

## 2012-11-15 DIAGNOSIS — F17200 Nicotine dependence, unspecified, uncomplicated: Secondary | ICD-10-CM | POA: Insufficient documentation

## 2012-11-15 DIAGNOSIS — Z862 Personal history of diseases of the blood and blood-forming organs and certain disorders involving the immune mechanism: Secondary | ICD-10-CM | POA: Insufficient documentation

## 2012-11-15 DIAGNOSIS — Z79899 Other long term (current) drug therapy: Secondary | ICD-10-CM | POA: Insufficient documentation

## 2012-11-15 LAB — BASIC METABOLIC PANEL
CO2: 25 mEq/L (ref 19–32)
Glucose, Bld: 96 mg/dL (ref 70–99)
Potassium: 4 mEq/L (ref 3.5–5.1)
Sodium: 138 mEq/L (ref 135–145)

## 2012-11-15 LAB — CBC WITH DIFFERENTIAL/PLATELET
Hemoglobin: 7.9 g/dL — ABNORMAL LOW (ref 13.0–17.0)
Lymphocytes Relative: 11 % — ABNORMAL LOW (ref 12–46)
Lymphs Abs: 0.9 10*3/uL (ref 0.7–4.0)
MCV: 65.9 fL — ABNORMAL LOW (ref 78.0–100.0)
Neutrophils Relative %: 75 % (ref 43–77)
Platelets: 422 10*3/uL — ABNORMAL HIGH (ref 150–400)
RBC: 3.9 MIL/uL — ABNORMAL LOW (ref 4.22–5.81)
WBC: 8.3 10*3/uL (ref 4.0–10.5)

## 2012-11-15 MED ORDER — PREDNISONE 50 MG PO TABS
60.0000 mg | ORAL_TABLET | Freq: Once | ORAL | Status: AC
  Administered 2012-11-15: 60 mg via ORAL
  Filled 2012-11-15: qty 1

## 2012-11-15 MED ORDER — SODIUM CHLORIDE 0.9 % IV SOLN
Freq: Once | INTRAVENOUS | Status: AC
  Administered 2012-11-15: 1000 mL via INTRAVENOUS

## 2012-11-15 NOTE — ED Notes (Signed)
Instructions reviewed and f/u information provided.  A&ox4; in no apparent distress.  Ambulatory with steady gait.

## 2012-11-15 NOTE — ED Provider Notes (Signed)
Medical screening examination/treatment/procedure(s) were performed by non-physician practitioner and as supervising physician I was immediately available for consultation/collaboration.   Hurman Horn, MD 11/15/12 2223

## 2012-11-15 NOTE — ED Provider Notes (Signed)
History    CSN: [id number] Arrival date & time 11/15/12  0530  First MD Initiated Contact with Patient 11/15/12 [phone]     Chief Complaint  Patient presents with  . Abdominal Pain   (Consider location/radiation/quality/duration/timing/severity/associated sxs/prior Treatment) HPI HPI Comments: Shane Klein is a 22 y.o. male who presents to the Emergency Department complaining of abdominal pain. He was seen here on 11/13/12 for the same complaint. He has a h/o Crohn's disease and has been out of his prednisone for 3 days. He was given a prescription for prednisone which he has not filled yet. He ate normal meals yesterday. Woke up this morning with abdominal pain. He says his stomach feels tight. He has taken no medications.     Past Medical History  Diagnosis Date  . 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. Celiac disease   . Crohn's disease with intestinal obstruction 03/01/2012  . Iron deficiency anemia   . GERD (gastroesophageal reflux disease)    History reviewed. No pertinent past surgical history. Family History  Problem Relation Age of Onset  . Diabetes Maternal Aunt   . Diabetes Maternal Grandmother    History  Substance Use Topics  . Smoking status: Current Every Day Smoker    Types: Cigarettes    Last Attempt to Quit: 03/01/2010  . Smokeless tobacco: Never Used  . Alcohol Use: No     Comment: rarely    Review of Systems  Constitutional: Negative for fever.       10 Systems reviewed and are negative for acute change except as noted in the HPI.  HENT: Negative for congestion.   Eyes: Negative for discharge and redness.  Respiratory: Negative for cough and shortness of breath.   Cardiovascular: Negative for chest pain.  Gastrointestinal: Positive for abdominal pain. Negative for vomiting.  Musculoskeletal: Negative for back pain.  Skin: Negative for rash.  Neurological: Negative for syncope, numbness and headaches.  Psychiatric/Behavioral:       No behavior change.    Allergies   Gluten meal and Wheat bran  Home Medications   Current Outpatient Rx  Name  Route  Sig  Dispense  Refill  . HYDROcodone-acetaminophen (NORCO/VICODIN) 5-325 MG per tablet   Oral   Take 1 tablet by mouth every 4 (four) hours as needed for pain.   10 tablet   0   . predniSONE (DELTASONE) 10 MG tablet      Take 4 tablet daily for 2 weeks, the 3 tablets daily for 2 weeks, then 2 tablet daily for 2 weeks the take 1 tablet daily for 2 weeks.   140 tablet   0   . predniSONE (DELTASONE) 10 MG tablet   Oral   Take 2 tablets (20 mg total) by mouth daily.   28 tablet   0   . promethazine (PHENERGAN) 25 MG tablet   Oral   Take 1 tablet (25 mg total) by mouth every 6 (six) hours as needed for nausea.   20 tablet   0    BP 142/67  Pulse 86  Temp(Src) 97.8 F (36.6 C) (Oral)  Resp 20  Ht 5\' 10"  (1.778 m)  Wt 170 lb (77.111 kg)  BMI 24.39 kg/m2  SpO2 98% Physical Exam  Constitutional: He is oriented to person, place, and time. He appears well-developed and well-nourished.  HENT:  Head: Normocephalic.  Eyes: EOM are normal. Pupils are equal, round, and reactive to light.  Neck: Normal range of motion.  Cardiovascular: Normal rate and intact  distal pulses.   Pulmonary/Chest: Breath sounds normal.  Abdominal: Soft. Bowel sounds are normal. There is no tenderness. There is no rebound and no guarding.  Musculoskeletal: Normal range of motion.  Neurological: He is alert and oriented to person, place, and time. He has normal reflexes.    ED Course  Procedures (including critical care time) Results for orders placed during the hospital encounter of 11/15/12  CBC WITH DIFFERENTIAL      Result Value Range   WBC 8.3  4.0 - 10.5 K/uL   RBC 3.90 (*) 4.22 - 5.81 MIL/uL   Hemoglobin 7.9 (*) 13.0 - 17.0 g/dL   HCT 16.1 (*) 09.6 - 04.5 %   MCV 65.9 (*) 78.0 - 100.0 fL   MCH 20.3 (*) 26.0 - 34.0 pg   MCHC 30.7  30.0 - 36.0 g/dL   RDW 40.9 (*) 81.1 - 91.4 %   Platelets 422 (*) 150 -  400 K/uL   Neutrophils Relative % 75  43 - 77 %   Neutro Abs 6.2  1.7 - 7.7 K/uL   Lymphocytes Relative 11 (*) 12 - 46 %   Lymphs Abs 0.9  0.7 - 4.0 K/uL   Monocytes Relative 10  3 - 12 %   Monocytes Absolute 0.8  0.1 - 1.0 K/uL   Eosinophils Relative 4  0 - 5 %   Eosinophils Absolute 0.3  0.0 - 0.7 K/uL   Basophils Relative 0  0 - 1 %   Basophils Absolute 0.0  0.0 - 0.1 K/uL  BASIC METABOLIC PANEL      Result Value Range   Sodium 138  135 - 145 mEq/L   Potassium 4.0  3.5 - 5.1 mEq/L   Chloride 107  96 - 112 mEq/L   CO2 25  19 - 32 mEq/L   Glucose, Bld 96  70 - 99 mg/dL   BUN 14  6 - 23 mg/dL   Creatinine, Ser 7.82  0.50 - 1.35 mg/dL   Calcium 9.0  8.4 - 95.6 mg/dL   GFR calc non Af Amer >90  >90 mL/min   GFR calc Af Amer >90  >90 mL/min  LIPASE, BLOOD      Result Value Range   Lipase 17  11 - 59 U/L     MDM  Patient here with continued abdominal pain and having not taken any prednisone. Given prednisone. Labs are unremarkable. He is to follow up with Dr. Jena Gauss. Pt stable in ED with no significant deterioration in condition.The patient appears reasonably screened and/or stabilized for discharge and I doubt any other medical condition or other [facility] requiring further screening, evaluation, or treatment in the ED at this time prior to discharge.  MDM Reviewed: nursing note and vitals Interpretation: labs     Nicoletta Dress. Colon Branch, MD 11/15/12 [phone]

## 2012-11-15 NOTE — ED Notes (Signed)
Intermittent abd pain for one week, seen here 2 days ago for same. Pain increasing again now. Hasn't been able to get his RX

## 2012-11-20 ENCOUNTER — Encounter: Payer: Self-pay | Admitting: Gastroenterology

## 2012-11-20 ENCOUNTER — Ambulatory Visit (INDEPENDENT_AMBULATORY_CARE_PROVIDER_SITE_OTHER): Payer: Medicaid Other | Admitting: Gastroenterology

## 2012-11-20 VITALS — BP 119/62 | HR 71 | Temp 97.2°F | Ht 70.0 in | Wt 159.6 lb

## 2012-11-20 DIAGNOSIS — K5090 Crohn's disease, unspecified, without complications: Secondary | ICD-10-CM

## 2012-11-20 DIAGNOSIS — D509 Iron deficiency anemia, unspecified: Secondary | ICD-10-CM

## 2012-11-20 DIAGNOSIS — K219 Gastro-esophageal reflux disease without esophagitis: Secondary | ICD-10-CM

## 2012-11-20 MED ORDER — BUDESONIDE 3 MG PO CP24: 9.0000 mg | ORAL_CAPSULE | ORAL | Status: DC

## 2012-11-20 MED ORDER — FERROUS SULFATE 325 (65 FE) MG PO TABS: 325.0000 mg | ORAL_TABLET | Freq: Two times a day (BID) | ORAL | Status: DC

## 2012-11-20 MED ORDER — OMEPRAZOLE 20 MG PO CPDR: 20.0000 mg | DELAYED_RELEASE_CAPSULE | Freq: Every day | ORAL | Status: DC

## 2012-11-20 NOTE — Assessment & Plan Note (Signed)
Start Prilosec once daily.

## 2012-11-20 NOTE — Patient Instructions (Addendum)
Start taking iron tablets twice a day with food (total of 2 tablets per day).   Start taking Prilosec each morning, 30 minutes before breakfast.   I have sent Entocort to your pharmacy. Start taking 3 capsules each morning. You will need to wean off the prednisone. Take Prednisone 10 mg each evening for 7 days, then 5 mg for 7 days, then off.   Please have blood work done and return the stool sample to our office.   We will see you in 4 weeks.

## 2012-11-20 NOTE — Assessment & Plan Note (Signed)
Likely secondary to Crohn's disease. Check ifobt today. Recheck CBC, iron, and ferritin at next appt. Start iron 325 mg po BID for now. No overt signs of GI bleeding.

## 2012-11-20 NOTE — Assessment & Plan Note (Signed)
21 year old male diagnosed in 2010 by Dr. Russella Dar with ileocolitis, with less than ideal management of disease secondary to non-compliance and lack of insurance. Appears to be at his baseline, with 2 loose stools in evening, no rectal bleeding. Reports lower abdominal discomfort that is constant, but no concerning signs on exam today and CT is up-to-date as of June 2014. He has been treated with Prednisone most recently and is still not taking this appropriately. Entocort prescribed in remote past. Will prescribe Entocort 9 mg daily in the hopes of inducing remission, with tapering of Prednisone as discussed with patient.   As of note, patient states he has celiac disease, but this has not been definitively diagnosed. Will reassess with serologies today. If positive, proceed with EGD with small bowel biopsies for confirmation. Return in 4 weeks.

## 2012-11-20 NOTE — Progress Notes (Signed)
Primary Care Physician:  No PCP Per Patient Primary Gastroenterologist:  Dr. Jena Gauss  Chief Complaint  Patient presents with  . Abdominal Pain  . Crohn's Disease  . Celiac Disease    HPI:   [name] is a 22 year old male presenting today at the request of the ED with a history of Crohn's ileocolitis. He also reports possible celiac disease. However, negative biopsies on EGD noted in 2010, with changes likely consistent with Crohn's. He had an elevated Gliadian peptide antibody but normal TTg, IgA and IgA several years ago.  He was discharged by Dr. Ardell Isaacs practice June 2012 due to non-compliance. He has had multiple emergency room visits, and he was recently inpatient in [facility] June 2014 due to a flare. Noncompliance has been an issue, as patient has not taken Prednisone as prescribed while inpatient or per ED notes.  Notes lower abdominal, sharp and crampy pain. Constant. Loose stool at night, twice. No increase in loose stools. Baseline for patient. No rectal bleeding. Weight fluctuates. Has a good appetite. No N/V. Last time he took Prednisone was yesterday, 10 mg. Just got insurance last week. Has some left at pharmacy. Notes GERD symptoms. No dysphagia. States he has abdominal bloating with anything that has gluten in it. Appears he has only been on this course of Prednisone for about a week.    Has been on Prednisone in the past. States he was on Entocort in the remote past.    Past Medical History  Diagnosis Date  . 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. Celiac disease     per patient. Negative biopsies  . Crohn's disease with intestinal obstruction 03/01/2012  . Iron deficiency anemia   . GERD (gastroesophageal reflux disease)     Past Surgical History  Procedure Laterality Date  . Esophagogastroduodenoscopy  Oct 2010    Dr. Russella Dar: duodenitis, normal villi, likely Crohn's. Elevated Gliadin but normal TTG, IgA and IgA  . Colonoscopy  Oct 2010    Dr. Russella Dar: evidence of Crohn's at cecum,  ascending colon, unable to intubate the terminal ileum. CTE with distal and terminal ileitis    Current Outpatient Prescriptions  Medication Sig Dispense Refill  . budesonide (ENTOCORT EC) 3 MG 24 hr capsule Take 3 capsules (9 mg total) by mouth every morning.  90 capsule  3  . ferrous sulfate 325 (65 FE) MG tablet Take 1 tablet (325 mg total) by mouth 2 (two) times daily with breakfast and lunch.  60 tablet  3  . omeprazole (PRILOSEC) 20 MG capsule Take 1 capsule (20 mg total) by mouth daily.  30 capsule  3  . predniSONE (DELTASONE) 10 MG tablet Take 2 tablets (20 mg total) by mouth daily.  28 tablet  0   No current facility-administered medications for this visit.    Allergies as of 11/20/2012 - Review Complete 11/20/2012  Allergen Reaction Noted  . Gluten meal Other (See Comments) 10/02/2012  . Wheat bran Other (See Comments) 03/01/2012    Family History  Problem Relation Age of Onset  . Diabetes Maternal Aunt   . Diabetes Maternal Grandmother   . Colon cancer Neg Hx   . Crohn's disease Maternal Aunt     History   Social History  . Marital Status: Single    Spouse Name: N/A    Number of Children: N/A  . Years of Education: N/A   Occupational History  . unemployed    Social History Main Topics  . Smoking status: Current Every Day Smoker  Types: Cigarettes    Last Attempt to Quit: 03/01/2010  . Smokeless tobacco: Never Used     Comment: 2-3 cigarettes per day  . Alcohol Use: No     Comment: rarely  . Drug Use: No  . Sexually Active: Not on file   Other Topics Concern  . Not on file   Social History Narrative  . No narrative on file    Review of Systems: Negative unless mentioned in HPI.   Physical Exam: BP 119/62  Pulse 71  Temp(Src) 97.2 F (36.2 C) (Oral)  Ht 5\' 10"  (1.778 m)  Wt 159 lb 9.6 oz (72.394 kg)  BMI 22.9 kg/m2 General:   Alert and oriented. Pleasant and cooperative. Well-nourished and well-developed.  Head:  Normocephalic and  atraumatic. Eyes:  Without icterus, sclera clear and conjunctiva pink.  Ears:  Normal auditory acuity. Nose:  No deformity, discharge,  or lesions. Mouth:  No deformity or lesions, oral mucosa pink.  Neck:  Supple, without mass or thyromegaly. Lungs:  Clear to auscultation bilaterally. No wheezes, rales, or rhonchi. No distress.  Heart:  S1, S2 present without murmurs appreciated.  Abdomen:  +BS, soft, non-tender and non-distended. No HSM noted. No guarding or rebound. No masses appreciated.  Rectal:  Deferred  Msk:  Symmetrical without gross deformities. Normal posture. Extremities:  Without clubbing or edema. Neurologic:  Alert and  oriented x4;  grossly normal neurologically. Skin:  Intact without significant lesions or rashes. Cervical Nodes:  No significant cervical adenopathy. Psych:  Alert and cooperative. Normal mood and affect.  Lab Results  Component Value Date   WBC 8.3 11/15/2012   HGB 7.9* 11/15/2012   HCT 25.7* 11/15/2012   MCV 65.9* 11/15/2012   PLT 422* 11/15/2012   Lab Results  Component Value Date   IRON <10* 10/05/2012   TIBC Not calculated due to Iron <10. 10/05/2012   FERRITIN 17* 10/05/2012    CT abd/pelvis June 2014:Pelvic small bowel and ileal loops demonstrate distension with thickened and edematous wall. Inflammatory infiltration in the right lower quadrant fat. Reactive mesenteric lymph nodes. Abdominal and pelvic ascites. Changes are consistent with active Crohn disease. Demonstrate some progression since previous study. (Prior study Feb 2014).

## 2012-11-29 NOTE — Progress Notes (Signed)
No PCP on File 

## 2012-12-18 ENCOUNTER — Ambulatory Visit: Payer: Self-pay | Admitting: Gastroenterology

## 2012-12-18 ENCOUNTER — Telehealth: Payer: Self-pay | Admitting: Gastroenterology

## 2012-12-18 NOTE — Telephone Encounter (Signed)
Pt was a no show

## 2012-12-19 NOTE — Telephone Encounter (Signed)
Needs letter for f/u.  

## 2012-12-20 ENCOUNTER — Encounter: Payer: Self-pay | Admitting: Gastroenterology

## 2012-12-20 NOTE — Telephone Encounter (Signed)
Mailed letter °

## 2013-04-13 ENCOUNTER — Emergency Department (HOSPITAL_COMMUNITY): Payer: Medicaid Other

## 2013-04-13 ENCOUNTER — Encounter (HOSPITAL_COMMUNITY): Payer: Self-pay | Admitting: Emergency Medicine

## 2013-04-13 ENCOUNTER — Inpatient Hospital Stay (HOSPITAL_COMMUNITY)
Admission: EM | Admit: 2013-04-13 | Discharge: 2013-04-17 | DRG: 385 | Disposition: A | Discharge status: Home / Self Care | Payer: Medicaid Other | Attending: Internal Medicine | Admitting: Internal Medicine

## 2013-04-13 DIAGNOSIS — Z87891 Personal history of nicotine dependence: Secondary | ICD-10-CM

## 2013-04-13 DIAGNOSIS — Z9119 Patient's noncompliance with other medical treatment and regimen: Secondary | ICD-10-CM

## 2013-04-13 DIAGNOSIS — R197 Diarrhea, unspecified: Secondary | ICD-10-CM | POA: Diagnosis present

## 2013-04-13 DIAGNOSIS — D649 Anemia, unspecified: Secondary | ICD-10-CM

## 2013-04-13 DIAGNOSIS — K900 Celiac disease: Secondary | ICD-10-CM

## 2013-04-13 DIAGNOSIS — K219 Gastro-esophageal reflux disease without esophagitis: Secondary | ICD-10-CM

## 2013-04-13 DIAGNOSIS — R112 Nausea with vomiting, unspecified: Secondary | ICD-10-CM | POA: Diagnosis present

## 2013-04-13 DIAGNOSIS — R109 Unspecified abdominal pain: Secondary | ICD-10-CM

## 2013-04-13 DIAGNOSIS — D509 Iron deficiency anemia, unspecified: Secondary | ICD-10-CM

## 2013-04-13 DIAGNOSIS — K5090 Crohn's disease, unspecified, without complications: Secondary | ICD-10-CM

## 2013-04-13 DIAGNOSIS — Z833 Family history of diabetes mellitus: Secondary | ICD-10-CM

## 2013-04-13 DIAGNOSIS — R634 Abnormal weight loss: Secondary | ICD-10-CM

## 2013-04-13 DIAGNOSIS — E43 Unspecified severe protein-calorie malnutrition: Secondary | ICD-10-CM

## 2013-04-13 DIAGNOSIS — IMO0002 Reserved for concepts with insufficient information to code with codable children: Secondary | ICD-10-CM

## 2013-04-13 DIAGNOSIS — Z91199 Patient's noncompliance with other medical treatment and regimen due to unspecified reason: Secondary | ICD-10-CM

## 2013-04-13 DIAGNOSIS — K2980 Duodenitis without bleeding: Secondary | ICD-10-CM

## 2013-04-13 DIAGNOSIS — K56609 Unspecified intestinal obstruction, unspecified as to partial versus complete obstruction: Secondary | ICD-10-CM

## 2013-04-13 DIAGNOSIS — K50912 Crohn's disease, unspecified, with intestinal obstruction: Secondary | ICD-10-CM | POA: Diagnosis present

## 2013-04-13 DIAGNOSIS — K5000 Crohn's disease of small intestine without complications: Principal | ICD-10-CM

## 2013-04-13 DIAGNOSIS — K5080 Crohn's disease of both small and large intestine without complications: Secondary | ICD-10-CM

## 2013-04-13 DIAGNOSIS — K50919 Crohn's disease, unspecified, with unspecified complications: Secondary | ICD-10-CM | POA: Diagnosis present

## 2013-04-13 DIAGNOSIS — K5669 Other intestinal obstruction: Secondary | ICD-10-CM | POA: Diagnosis present

## 2013-04-13 LAB — IRON AND TIBC
Iron: <10 ug/dL — ABNORMAL LOW (ref 42–135)
UIBC: 290 ug/dL (ref 125–400)

## 2013-04-13 LAB — CBC WITH DIFFERENTIAL/PLATELET
Basophils Relative: 1 % (ref 0–1)
Eosinophils Relative: 4 % (ref 0–5)
HCT: 21.6 % — ABNORMAL LOW (ref 39.0–52.0)
Hemoglobin: 6.6 g/dL — CL (ref 13.0–17.0)
Lymphs Abs: 1.4 10*3/uL (ref 0.7–4.0)
MCV: 63 fL — ABNORMAL LOW (ref 78.0–100.0)
Monocytes Relative: 16 % — ABNORMAL HIGH (ref 3–12)
Neutro Abs: 3.7 10*3/uL (ref 1.7–7.7)
RBC: 3.43 MIL/uL — ABNORMAL LOW (ref 4.22–5.81)
WBC: 6.5 10*3/uL (ref 4.0–10.5)

## 2013-04-13 LAB — COMPREHENSIVE METABOLIC PANEL
AST: 10 U/L (ref 0–37)
BUN: 23 mg/dL (ref 6–23)
CO2: 26 mEq/L (ref 19–32)
Chloride: 112 mEq/L (ref 96–112)
Creatinine, Ser: 1.15 mg/dL (ref 0.50–1.35)
GFR calc non Af Amer: 89 mL/min — ABNORMAL LOW (ref >90)
Total Bilirubin: <0.1 mg/dL — ABNORMAL LOW (ref 0.3–1.2)

## 2013-04-13 LAB — RETICULOCYTES
Retic Count, Absolute: 38.2 10*3/uL (ref 19.0–186.0)
Retic Ct Pct: 1 % (ref 0.4–3.1)

## 2013-04-13 LAB — OCCULT BLOOD, POC DEVICE: Fecal Occult Bld: NEGATIVE

## 2013-04-13 MED ORDER — SODIUM CHLORIDE 0.9 % IV BOLUS (SEPSIS)
1000.0000 mL | Freq: Once | INTRAVENOUS | Status: AC
  Administered 2013-04-13: 1000 mL via INTRAVENOUS

## 2013-04-13 MED ORDER — ONDANSETRON HCL 4 MG/2ML IJ SOLN
4.0000 mg | Freq: Once | INTRAMUSCULAR | Status: AC
  Administered 2013-04-13: 4 mg via INTRAVENOUS
  Filled 2013-04-13: qty 2

## 2013-04-13 MED ORDER — MORPHINE SULFATE 4 MG/ML IJ SOLN
4.0000 mg | Freq: Once | INTRAMUSCULAR | Status: AC
  Administered 2013-04-13: 4 mg via INTRAVENOUS
  Filled 2013-04-13: qty 1

## 2013-04-13 MED ORDER — HYDROMORPHONE HCL PF 1 MG/ML IJ SOLN
1.0000 mg | Freq: Once | INTRAMUSCULAR | Status: AC
  Administered 2013-04-13: 1 mg via INTRAVENOUS
  Filled 2013-04-13: qty 1

## 2013-04-13 MED ORDER — IOHEXOL 300 MG/ML  SOLN
100.0000 mL | Freq: Once | INTRAMUSCULAR | Status: AC | PRN
  Administered 2013-04-13: 100 mL via INTRAVENOUS

## 2013-04-13 MED ORDER — IOHEXOL 300 MG/ML  SOLN
50.0000 mL | Freq: Once | INTRAMUSCULAR | Status: AC | PRN
  Administered 2013-04-13: 50 mL via ORAL

## 2013-04-13 NOTE — Consult Note (Signed)
Reason for Consult:abdominal pain, Crohn's disease, small bowel obstruction Referring Physician: Jola Schmidt EDP N. hospitalist service  Shane Klein is an 22 y.o. male.  HPI: patient is a 22 year old male diagnosed with Crohn's disease approximately 2010. Due to socioeconomic issues he has not had regular GI followup. For the most part he has presented to the emergency department and occasionally been admitted when he has had a flareup of Crohn's disease. He has been treated nonoperatively for bowel obstruction at least 2 times in the past. He has been on steroids intermittently. He was last hospitalized in June of this year and treated with prednisone with resolution of his pain and partial obstruction. He did start enterocort at that time and states he took it for about 4 months but stopped 2 months ago on his own. He says that this morning he developed a fairly acute onset of lower and mid abdominal pain. He has noted abdominal distention. Some nausea but no vomiting. He has had bowel movements which occasionally are normal and occasionally loose including this morning. He presented to the emergency department for treatment. No fever or chills. No anorectal symptoms. He also carries a diagnosis of celiac disease and has had severe persistent iron deficiency anemia.  Past Medical History  Diagnosis Date  . 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. Celiac disease     per patient. Negative biopsies  . Crohn's disease with intestinal obstruction 03/01/2012  . Iron deficiency anemia   . GERD (gastroesophageal reflux disease)     Past Surgical History  Procedure Laterality Date  . Esophagogastroduodenoscopy  Oct 2010    Dr. Fuller Plan: duodenitis, normal villi, likely Crohn's. Elevated Gliadin but normal TTG, IgA and IgA  . Colonoscopy  Oct 2010    Dr. Fuller Plan: evidence of Crohn's at cecum, ascending colon, unable to intubate the terminal ileum. CTE with distal and terminal ileitis    Family History  Problem Relation Age  of Onset  . Diabetes Maternal Aunt   . Diabetes Maternal Grandmother   . Colon cancer Neg Hx   . Crohn's disease Maternal Aunt     Social History:  reports that he quit smoking about 2 months ago. His smoking use included Cigarettes. He smoked 0.00 packs per day. He has never used smokeless tobacco. He reports that he does not drink alcohol or use illicit drugs.  Allergies:  Allergies  Allergen Reactions  . Gluten Meal Other (See Comments)    Stomach pain  . Wheat Bran Other (See Comments)    Belly pain    No current facility-administered medications for this encounter.   Current Outpatient Prescriptions  Medication Sig Dispense Refill  . budesonide (ENTOCORT EC) 3 MG 24 hr capsule Take 3 capsules (9 mg total) by mouth every morning.  90 capsule  3  . ferrous sulfate 325 (65 FE) MG tablet Take 1 tablet (325 mg total) by mouth 2 (two) times daily with breakfast and lunch.  60 tablet  3  . omeprazole (PRILOSEC) 20 MG capsule Take 1 capsule (20 mg total) by mouth daily.  30 capsule  3     Results for orders placed during the hospital encounter of 04/13/13 (from the past 48 hour(s))  CBC WITH DIFFERENTIAL     Status: Abnormal   Collection Time    04/13/13  3:29 PM      Result Value Range   WBC 6.5  4.0 - 10.5 K/uL   RBC 3.43 (*) 4.22 - 5.81 MIL/uL   Hemoglobin 6.6 (*)  13.0 - 17.0 g/dL   Comment: REPEATED TO VERIFY     CRITICAL RESULT CALLED TO, READ BACK BY AND VERIFIED WITH:     M.ROSSER RN AT 1613 ON 19DEC14 BY C.BONGEL   HCT 21.6 (*) 39.0 - 52.0 %   MCV 63.0 (*) 78.0 - 100.0 fL   MCH 19.2 (*) 26.0 - 34.0 pg   MCHC 30.6  30.0 - 36.0 g/dL   RDW 19.6 (*) 11.5 - 15.5 %   Platelets 734 (*) 150 - 400 K/uL   Neutrophils Relative % 57  43 - 77 %   Lymphocytes Relative 22  12 - 46 %   Monocytes Relative 16 (*) 3 - 12 %   Eosinophils Relative 4  0 - 5 %   Basophils Relative 1  0 - 1 %   Neutro Abs 3.7  1.7 - 7.7 K/uL   Lymphs Abs 1.4  0.7 - 4.0 K/uL   Monocytes Absolute 1.0   0.1 - 1.0 K/uL   Eosinophils Absolute 0.3  0.0 - 0.7 K/uL   Basophils Absolute 0.1  0.0 - 0.1 K/uL   RBC Morphology TARGET CELLS    COMPREHENSIVE METABOLIC PANEL     Status: Abnormal   Collection Time    04/13/13  3:29 PM      Result Value Range   Sodium 144  135 - 145 mEq/L   Potassium 4.0  3.5 - 5.1 mEq/L   Chloride 112  96 - 112 mEq/L   CO2 26  19 - 32 mEq/L   Glucose, Bld 77  70 - 99 mg/dL   BUN 23  6 - 23 mg/dL   Creatinine, Ser 1.15  0.50 - 1.35 mg/dL   Calcium 8.5  8.4 - 10.5 mg/dL   Total Protein 5.8 (*) 6.0 - 8.3 g/dL   Albumin 2.1 (*) 3.5 - 5.2 g/dL   AST 10  0 - 37 U/L   ALT 6  0 - 53 U/L   Alkaline Phosphatase 53  39 - 117 U/L   Total Bilirubin <0.1 (*) 0.3 - 1.2 mg/dL   GFR calc non Af Amer 89 (*) >90 mL/min   GFR calc Af Amer >90  >90 mL/min   Comment: (NOTE)     The eGFR has been calculated using the CKD EPI equation.     This calculation has not been validated in all clinical situations.     eGFR's persistently <90 mL/min signify possible Chronic Kidney     Disease.  LIPASE, BLOOD     Status: None   Collection Time    04/13/13  3:29 PM      Result Value Range   Lipase 55  11 - 59 U/L  OCCULT BLOOD, POC DEVICE     Status: None   Collection Time    04/13/13  4:51 PM      Result Value Range   Fecal Occult Bld NEGATIVE  NEGATIVE  RETICULOCYTES     Status: Abnormal   Collection Time    04/13/13  5:16 PM      Result Value Range   Retic Ct Pct 1.0  0.4 - 3.1 %   RBC. 3.82 (*) 4.22 - 5.81 MIL/uL   Retic Count, Manual 38.2  19.0 - 186.0 K/uL    Ct Abdomen Pelvis W Contrast  04/13/2013   CLINICAL DATA:  Crohn's and celiac disease. Abdominal distention, nausea, pain.  EXAM: CT ABDOMEN AND PELVIS WITH CONTRAST  TECHNIQUE: Multidetector CT imaging of  the abdomen and pelvis was performed using the standard protocol following bolus administration of intravenous contrast.  CONTRAST:  40m OMNIPAQUE IOHEXOL 300 MG/ML SOLN, 1023mOMNIPAQUE IOHEXOL 300 MG/ML SOLN   COMPARISON:  10/05/2012  FINDINGS: Visualized lung bases clear. Increase in mild to moderate abdominal ascites. Unremarkable liver, nondistended gallbladder, spleen, adrenal glands, and kidneys, pancreas, aorta, portal vein. Stomach is nondilated. There is incomplete progression of the oral contrast material beyond the proximal small bowel which is decompressed.  There is a loop of a markedly dilated distal small bowel which protrudes into the pelvis, with circumferential wall thickening and mucosal enhancement. This is probably a loop of distal jejunum. There is a 2nd distended loop of small bowel the right mid abdomen with small bowel feces sign. Both are associated with a poorly marginated a right lower quadrant enhancing soft tissue region measuring approximately 5.7 cm transverse diameter, and near the region of the terminal ileum which is poorly defined. There are adjacent enlarged mesenteric lymph nodes. The colon is nondistended.  The urinary bladder is physiologically distended. No definite abscess. No free air.  IMPRESSION: 1. High-grade mid/distal small bowel obstruction, possibly at 2 separate levels, associated with a right lower quadrant enhancing inflammatory process, more conspicuous than on prior exam. Suspect progressive active Crohn's disease. 2. No evidence of abscess or perforation. 3. Increase in abdominal ascites.   Electronically Signed   By: DaArne Cleveland.D.   On: 04/13/2013 19:24   Dg Abd Acute W/chest  04/13/2013   CLINICAL DATA:  Abdominal pain. History of Crohn's disease and celiac disease.  EXAM: ACUTE ABDOMEN SERIES (ABDOMEN 2 VIEW & CHEST 1 VIEW)  COMPARISON:  10/05/2012, CT of the abdomen pelvis.  FINDINGS: There is a distended loop small bowel in the left mid abdomen. No other bowel dilation is seen. There are air-fluid levels on the erect view. No free air.  The soft tissues are unremarkable.  Normal bony structures.  Included frontal radiograph of the chest shows a normal  heart, mediastinum and hila and clear lungs.  IMPRESSION: 1. Single loop of distended small bowel in the left mid abdomen. No other bowel dilation. There are air-fluid levels on the erect view suggesting a diffuse adynamic ileus. These findings may reflect active inflammatory bowel disease. Recommend followup CT the abdomen pelvis with contrast. 2. No free air. 3. Normal chest radiograph.   Electronically Signed   By: DaLajean Manes.D.   On: 04/13/2013 16:33    Review of Systems  Constitutional: Negative for fever and chills.  Eyes: Negative.   Respiratory: Negative.   Cardiovascular: Negative.   Gastrointestinal: Positive for nausea, abdominal pain and diarrhea. Negative for vomiting, blood in stool and melena.   Blood pressure 145/75, pulse 86, temperature 98.4 F (36.9 C), temperature source Oral, resp. rate 16, SpO2 100.00%. Physical Exam General: Alert, thin African American male, in no distress Skin: Warm and dry without rash or infection. HEENT: No palpable masses or thyromegaly. Sclera nonicteric. Pupils equal round and reactive. Oropharynx clear. Lymph nodes: No cervical, supraclavicular, or inguinal nodes palpable. Lungs: Breath sounds clear and equal without increased work of breathing Cardiovascular: Regular rate and rhythm without murmur. No JVD or edema. Peripheral pulses intact. Abdomen: moderately distended and tympanitic. Bowel sounds present. Minimal diffuse tenderness with no guarding. No palpable masses or hepatosplenomegaly. No hernias. Extremities: No edema or joint swelling or deformity. No chronic venous stasis changes. Neurologic: Alert and fully oriented. Gait normal.  Assessment/Plan: [age]ear old male with approximately  4 year history of Crohn's disease which has been only sporadically treated due to compliance and economic issues. He now presents with high-grade small bowel obstruction which appears to be in possibly 2 areas of the small bowel with an inflammatory  focus in the right lower quadrant around the terminal ileum. The patient is being admitted by the hospitalist service for initial medical management. NG tube is in place. The patient has had numerous flares and episodes of obstruction. I think he is likely to require surgery in the near future if not this hospitalization. Also severe iron deficiency anemia. Will follow during this hospitalization.  Nahomy Limburg T 04/13/2013, 9:36 PM

## 2013-04-13 NOTE — ED Notes (Signed)
EDP and PA aware of hbg of 6.6 and pt's unchanged response to pain.

## 2013-04-13 NOTE — ED Notes (Signed)
Patient transported to CT 

## 2013-04-13 NOTE — ED Notes (Signed)
Per EMS pt has had abdominal pain since 1030 this am. Pt reports diarrhea and nausea but denies vomiting. Pt has hx of Crohn's. Pt reports getting out of jail last night and claims to have had to eat a gluten diet and said it may have contributed to present abdominal pain/upset. Pt reports pain 7/10.

## 2013-04-13 NOTE — ED Notes (Signed)
Bed: WA25 Expected date:  Expected time:  Means of arrival:  Comments: EMS-abdominal pain 

## 2013-04-13 NOTE — ED Notes (Signed)
Surgeon at bedside.  

## 2013-04-13 NOTE — ED Provider Notes (Signed)
CSN: [id number]     Arrival date & time 04/13/13  1436 History   First MD Initiated Contact with Patient 04/13/13 1528     Chief Complaint  Patient presents with  . Abdominal Pain   (Consider location/radiation/quality/duration/timing/severity/associated sxs/prior Treatment) HPI Comments: Patient is a 22 year old male with a past medical history of Crohn's disease and Celiac disease, per patient, who presents with abdominal pain since this morning. The pain is located in his generalized lower abdomen and does not radiate. The pain is described as cramping and severe. The pain started gradually and progressively worsened since the onset. No alleviating/aggravating factors. The patient has tried nothing for symptoms without relief. Associated symptoms include nausea. Patient denies fever, headache, vomiting, diarrhea, chest pain, SOB, dysuria, constipation. Patient last saw a Gastroenterologist about 5 months ago and has not followed up since.    Past Medical History  Diagnosis Date  . 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. Celiac disease     per patient. Negative biopsies  . Crohn's disease with intestinal obstruction 03/01/2012  . Iron deficiency anemia   . GERD (gastroesophageal reflux disease)    Past Surgical History  Procedure Laterality Date  . Esophagogastroduodenoscopy  Oct 2010    Dr. Russella Dar: duodenitis, normal villi, likely Crohn's. Elevated Gliadin but normal TTG, IgA and IgA  . Colonoscopy  Oct 2010    Dr. Russella Dar: evidence of Crohn's at cecum, ascending colon, unable to intubate the terminal ileum. CTE with distal and terminal ileitis   Family History  Problem Relation Age of Onset  . Diabetes Maternal Aunt   . Diabetes Maternal Grandmother   . Colon cancer Neg Hx   . Crohn's disease Maternal Aunt    History  Substance Use Topics  . Smoking status: Former Smoker    Types: Cigarettes    Quit date: 01/24/2013  . Smokeless tobacco: Never Used     Comment: 2-3 cigarettes per day  . Alcohol  Use: No     Comment: rarely    Review of Systems  Constitutional: Negative for fever, chills and fatigue.  HENT: Negative for trouble swallowing.   Eyes: Negative for visual disturbance.  Respiratory: Negative for shortness of breath.   Cardiovascular: Negative for chest pain and palpitations.  Gastrointestinal: Positive for nausea and abdominal pain. Negative for vomiting and diarrhea.  Genitourinary: Negative for dysuria and difficulty urinating.  Musculoskeletal: Negative for arthralgias and neck pain.  Skin: Negative for color change.  Neurological: Negative for dizziness and weakness.  Psychiatric/Behavioral: Negative for dysphoric mood.    Allergies  Gluten meal and Wheat bran  Home Medications   Current Outpatient Rx  Name  Route  Sig  Dispense  Refill  . budesonide (ENTOCORT EC) 3 MG 24 hr capsule   Oral   Take 3 capsules (9 mg total) by mouth every morning.   90 capsule   3   . ferrous sulfate 325 (65 FE) MG tablet   Oral   Take 1 tablet (325 mg total) by mouth 2 (two) times daily with breakfast and lunch.   60 tablet   3   . omeprazole (PRILOSEC) 20 MG capsule   Oral   Take 1 capsule (20 mg total) by mouth daily.   30 capsule   3    BP 147/86  Pulse 77  Temp(Src) 98.5 F (36.9 C) (Oral)  Resp 16  SpO2 100% Physical Exam  Nursing note and vitals reviewed. Constitutional: He is oriented to person, place, and time. He  appears well-developed and well-nourished. No distress.  HENT:  Head: Normocephalic and atraumatic.  Eyes: Conjunctivae and EOM are normal.  Neck: Normal range of motion.  Cardiovascular: Normal rate and regular rhythm.  Exam reveals no gallop and no friction rub.   No murmur heard. Pulmonary/Chest: Effort normal and breath sounds normal. He has no wheezes. He has no rales. He exhibits no tenderness.  Abdominal: Soft. He exhibits no distension. There is tenderness. There is no rebound and no guarding.  Generalized lower abdominal  tenderness to palpation. No focal tenderness or peritoneal signs.   Musculoskeletal: Normal range of motion.  Neurological: He is alert and oriented to person, place, and time. Coordination normal.  Speech is goal-oriented. Moves limbs without ataxia.   Skin: Skin is warm and dry.  Psychiatric: He has a normal mood and affect. His behavior is normal.    ED Course  Procedures (including critical care time) Labs Review Labs Reviewed  CBC WITH DIFFERENTIAL - Abnormal; Notable for the following:    RBC 3.43 (*)    Hemoglobin 6.6 (*)    HCT 21.6 (*)    MCV 63.0 (*)    MCH 19.2 (*)    RDW 19.6 (*)    Platelets 734 (*)    Monocytes Relative 16 (*)    All other components within normal limits  COMPREHENSIVE METABOLIC PANEL - Abnormal; Notable for the following:    Total Protein 5.8 (*)    Albumin 2.1 (*)    Total Bilirubin <0.1 (*)    GFR calc non Af Amer 89 (*)    All other components within normal limits  IRON AND TIBC - Abnormal; Notable for the following:    Iron <10 (*)    All other components within normal limits  RETICULOCYTES - Abnormal; Notable for the following:    RBC. 3.82 (*)    All other components within normal limits  LIPASE, BLOOD  VITAMIN B12  FOLATE  FERRITIN  OCCULT BLOOD, POC DEVICE   Imaging Review Ct Abdomen Pelvis W Contrast  04/13/2013   CLINICAL DATA:  Crohn's and celiac disease. Abdominal distention, nausea, pain.  EXAM: CT ABDOMEN AND PELVIS WITH CONTRAST  TECHNIQUE: Multidetector CT imaging of the abdomen and pelvis was performed using the standard protocol following bolus administration of intravenous contrast.  CONTRAST:  50mL OMNIPAQUE IOHEXOL 300 MG/ML SOLN, OMNIPAQUE IOHEXOL 300 MG/ML SOLN  COMPARISON:  10/05/2012  FINDINGS: Visualized lung bases clear. Increase in mild to moderate abdominal ascites. Unremarkable liver, nondistended gallbladder, spleen, adrenal glands, and kidneys, pancreas, aorta, portal vein. Stomach is nondilated. There is  incomplete progression of the oral contrast material beyond the proximal small bowel which is decompressed.  There is a loop of a markedly dilated distal small bowel which protrudes into the pelvis, with circumferential wall thickening and mucosal enhancement. This is probably a loop of distal jejunum. There is a 2nd distended loop of small bowel the right mid abdomen with small bowel feces sign. Both are associated with a poorly marginated a right lower quadrant enhancing soft tissue region measuring approximately 5.7 cm transverse diameter, and near the region of the terminal ileum which is poorly defined. There are adjacent enlarged mesenteric lymph nodes. The colon is nondistended.  The urinary bladder is physiologically distended. No definite abscess. No free air.  IMPRESSION: 1. High-grade mid/distal small bowel obstruction, possibly at 2 separate levels, associated with a right lower quadrant enhancing inflammatory process, more conspicuous than on prior exam. Suspect progressive active  Crohn's disease. 2. No evidence of abscess or perforation. 3. Increase in abdominal ascites.   Electronically Signed   By: Oley Balm M.D.   On: 04/13/2013 19:24   Dg Abd Acute W/chest  04/13/2013   CLINICAL DATA:  Abdominal pain. History of Crohn's disease and celiac disease.  EXAM: ACUTE ABDOMEN SERIES (ABDOMEN 2 VIEW & CHEST 1 VIEW)  COMPARISON:  10/05/2012, CT of the abdomen pelvis.  FINDINGS: There is a distended loop small bowel in the left mid abdomen. No other bowel dilation is seen. There are air-fluid levels on the erect view. No free air.  The soft tissues are unremarkable.  Normal bony structures.  Included frontal radiograph of the chest shows a normal heart, mediastinum and hila and clear lungs.  IMPRESSION: 1. Single loop of distended small bowel in the left mid abdomen. No other bowel dilation. There are air-fluid levels on the erect view suggesting a diffuse adynamic ileus. These findings may reflect  active inflammatory bowel disease. Recommend followup CT the abdomen pelvis with contrast. 2. No free air. 3. Normal chest radiograph.   Electronically Signed   By: Amie Portland M.D.   On: 04/13/2013 16:33    EKG Interpretation   None       MDM   1. Small bowel obstruction   2. Anemia     3:44 PM Labs pending. Patient will have morphine and zofran for symptoms. Vitals stable and patient afebrile. Patient will have acute abdominal series to rule out obstruction due to previous history of obstruction. Patient will have pain control here. If no obstruction, patient will be discharged with prescription for prednisone and instructions to follow up with Gastroenterology.    Patient will be admitted for small bowel obstruction with possible Crohn's flare.   Emilia Beck, [place] 04/14/13 [phone]

## 2013-04-14 ENCOUNTER — Telehealth (HOSPITAL_BASED_OUTPATIENT_CLINIC_OR_DEPARTMENT_OTHER): Payer: Self-pay | Admitting: *Deleted

## 2013-04-14 ENCOUNTER — Encounter (HOSPITAL_COMMUNITY): Payer: Self-pay | Admitting: Internal Medicine

## 2013-04-14 DIAGNOSIS — D649 Anemia, unspecified: Secondary | ICD-10-CM

## 2013-04-14 DIAGNOSIS — R609 Edema, unspecified: Secondary | ICD-10-CM

## 2013-04-14 DIAGNOSIS — K5090 Crohn's disease, unspecified, without complications: Secondary | ICD-10-CM

## 2013-04-14 DIAGNOSIS — K56609 Unspecified intestinal obstruction, unspecified as to partial versus complete obstruction: Secondary | ICD-10-CM | POA: Diagnosis present

## 2013-04-14 LAB — COMPREHENSIVE METABOLIC PANEL
AST: 9 U/L (ref 0–37)
BUN: 16 mg/dL (ref 6–23)
CO2: 26 mEq/L (ref 19–32)
Calcium: 8.6 mg/dL (ref 8.4–10.5)
Creatinine, Ser: 0.77 mg/dL (ref 0.50–1.35)
GFR calc Af Amer: >90 mL/min (ref >90)
GFR calc non Af Amer: >90 mL/min (ref >90)
Total Protein: 5.5 g/dL — ABNORMAL LOW (ref 6.0–8.3)

## 2013-04-14 LAB — CBC
HCT: 23.1 % — ABNORMAL LOW (ref 39.0–52.0)
HCT: 24.3 % — ABNORMAL LOW (ref 39.0–52.0)
Hemoglobin: 6.9 g/dL — CL (ref 13.0–17.0)
Hemoglobin: 7.4 g/dL — ABNORMAL LOW (ref 13.0–17.0)
MCH: 18.8 pg — ABNORMAL LOW (ref 26.0–34.0)
MCHC: 29.9 g/dL — ABNORMAL LOW (ref 30.0–36.0)
Platelets: 600 10*3/uL — ABNORMAL HIGH (ref 150–400)
RBC: 3.67 MIL/uL — ABNORMAL LOW (ref 4.22–5.81)
RBC: 3.72 MIL/uL — ABNORMAL LOW (ref 4.22–5.81)
RDW: 19.6 % — ABNORMAL HIGH (ref 11.5–15.5)
WBC: 8.5 10*3/uL (ref 4.0–10.5)

## 2013-04-14 LAB — PREPARE RBC (CROSSMATCH)

## 2013-04-14 LAB — PHOSPHORUS: Phosphorus: 3.5 mg/dL (ref 2.3–4.6)

## 2013-04-14 LAB — TSH: TSH: 1.833 u[IU]/mL (ref 0.350–4.500)

## 2013-04-14 LAB — FERRITIN: Ferritin: 6 ng/mL — ABNORMAL LOW (ref 22–322)

## 2013-04-14 MED ORDER — SODIUM CHLORIDE 0.9 % IJ SOLN
3.0000 mL | Freq: Two times a day (BID) | INTRAMUSCULAR | Status: DC
  Administered 2013-04-14 – 2013-04-17 (×4): 3 mL via INTRAVENOUS

## 2013-04-14 MED ORDER — ONDANSETRON HCL 4 MG/2ML IJ SOLN
4.0000 mg | Freq: Four times a day (QID) | INTRAMUSCULAR | Status: DC | PRN
  Administered 2013-04-16: 4 mg via INTRAVENOUS
  Filled 2013-04-14: qty 2

## 2013-04-14 MED ORDER — ACETAMINOPHEN 650 MG RE SUPP: 650.0000 mg | Freq: Four times a day (QID) | RECTAL | Status: DC | PRN

## 2013-04-14 MED ORDER — SODIUM CHLORIDE 0.9 % IV SOLN: INTRAVENOUS | Status: DC

## 2013-04-14 MED ORDER — KCL IN DEXTROSE-NACL 10-5-0.45 MEQ/L-%-% IV SOLN
INTRAVENOUS | Status: DC
  Administered 2013-04-14 – 2013-04-16 (×4): via INTRAVENOUS
  Filled 2013-04-14 (×6): qty 1000

## 2013-04-14 MED ORDER — DIPHENHYDRAMINE HCL 25 MG PO CAPS
25.0000 mg | ORAL_CAPSULE | Freq: Once | ORAL | Status: AC
  Administered 2013-04-14: 25 mg via ORAL
  Filled 2013-04-14: qty 1

## 2013-04-14 MED ORDER — ACETAMINOPHEN 325 MG PO TABS
650.0000 mg | ORAL_TABLET | Freq: Once | ORAL | Status: AC
  Administered 2013-04-14: 07:00:00 650 mg via ORAL
  Filled 2013-04-14: qty 2

## 2013-04-14 MED ORDER — ENOXAPARIN SODIUM 40 MG/0.4ML ~~LOC~~ SOLN
40.0000 mg | SUBCUTANEOUS | Status: DC
  Administered 2013-04-14 – 2013-04-16 (×3): 40 mg via SUBCUTANEOUS
  Filled 2013-04-14 (×4): qty 0.4

## 2013-04-14 MED ORDER — METHYLPREDNISOLONE SODIUM SUCC 40 MG IJ SOLR
40.0000 mg | Freq: Three times a day (TID) | INTRAMUSCULAR | Status: AC
  Administered 2013-04-14 – 2013-04-16 (×6): 40 mg via INTRAVENOUS
  Filled 2013-04-14 (×6): qty 1

## 2013-04-14 MED ORDER — ONDANSETRON HCL 4 MG PO TABS: 4.0000 mg | ORAL_TABLET | Freq: Four times a day (QID) | ORAL | Status: DC | PRN

## 2013-04-14 MED ORDER — ACETAMINOPHEN 325 MG PO TABS: 650.0000 mg | ORAL_TABLET | Freq: Four times a day (QID) | ORAL | Status: DC | PRN

## 2013-04-14 MED ORDER — PANTOPRAZOLE SODIUM 40 MG IV SOLR
40.0000 mg | Freq: Every day | INTRAVENOUS | Status: DC
  Administered 2013-04-14 – 2013-04-16 (×3): 40 mg via INTRAVENOUS
  Filled 2013-04-14 (×4): qty 40

## 2013-04-14 MED ORDER — SODIUM CHLORIDE 0.9 % IV SOLN: 500.0000 mL | Freq: Once | INTRAVENOUS | Status: DC

## 2013-04-14 MED ORDER — DOCUSATE SODIUM 100 MG PO CAPS
100.0000 mg | ORAL_CAPSULE | Freq: Two times a day (BID) | ORAL | Status: DC
  Administered 2013-04-14 (×2): 100 mg via ORAL
  Filled 2013-04-14 (×4): qty 1

## 2013-04-14 MED ORDER — BISACODYL 10 MG RE SUPP: 10.0000 mg | Freq: Every day | RECTAL | Status: DC | PRN

## 2013-04-14 MED ORDER — HYDROCODONE-ACETAMINOPHEN 5-325 MG PO TABS
1.0000 | ORAL_TABLET | ORAL | Status: DC | PRN
  Administered 2013-04-14 (×2): 2 via ORAL
  Filled 2013-04-14 (×2): qty 2

## 2013-04-14 NOTE — ED Notes (Signed)
Pt continues to rest comfortably,  He is awaiting admission

## 2013-04-14 NOTE — Consult Note (Signed)
[facility] Consultation Note  Referring Provider: Dr. Renae Fickle ([facility]) Primary Care Physician:  No PCP Per Patient Primary Gastroenterologist:  None  Reason for Consultation:  Small bowel obstruction, Crohn's disease  HPI: Shane Klein is a 22 y.o. male admitted for small bowel obstruction in setting of Crohn's disease.  With bowel rest and supportive management, patient has improved.  He is much less nauseated, is tolerating sips of water, and his abdominal pain and distention have improved.  He is also now passing flatus and some scant stool.  Patient was diagnosed with Crohn's disease about bout of similar symptoms in 2010, via colonoscopy by Dr. Russella Dar, where ileocecal inflammation was seen (biopsies consistent with Crohn's disease), but unable to inspect terminal ileum at that time supposedly due to possible stricture.  Patient has been lost to follow-up for 4 years and has not seen a gastroenterologist for his Crohn's.  He came in on no Crohn's medication.   Past Medical History  Diagnosis Date  . 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. Celiac disease     per patient. Negative biopsies  . Crohn's disease with intestinal obstruction 03/01/2012  . Iron deficiency anemia   . GERD (gastroesophageal reflux disease)     Past Surgical History  Procedure Laterality Date  . Esophagogastroduodenoscopy  Oct 2010    Dr. Russella Dar: duodenitis, normal villi, likely Crohn's. Elevated Gliadin but normal TTG, IgA and IgA  . Colonoscopy  Oct 2010    Dr. Russella Dar: evidence of Crohn's at cecum, ascending colon, unable to intubate the terminal ileum. CTE with distal and terminal ileitis    Prior to Admission medications   Medication Sig Start Date End Date Taking? Authorizing Provider  budesonide (ENTOCORT EC) 3 MG 24 hr capsule Take 3 capsules (9 mg total) by mouth every morning. 11/20/12  Yes Nira Retort, NP  omeprazole (PRILOSEC) 20 MG capsule Take 1 capsule (20 mg total) by mouth daily.  11/20/12  Yes Nira Retort, NP    Current Facility-Administered Medications  Medication Dose Route Frequency Provider Last Rate Last Dose  . acetaminophen (TYLENOL) tablet 650 mg  650 mg Oral Q6H PRN Therisa Doyne, MD       Or  . acetaminophen (TYLENOL) suppository 650 mg  650 mg Rectal Q6H PRN Therisa Doyne, MD      . bisacodyl (DULCOLAX) suppository 10 mg  10 mg Rectal Daily PRN Renae Fickle, MD      . dextrose 5 % and 0.45 % NaCl with KCl 10 mEq/L infusion   Intravenous Continuous Renae Fickle, MD 100 mL/hr at 04/14/13 [phone]    . docusate sodium (COLACE) capsule 100 mg  100 mg Oral BID Therisa Doyne, MD   100 mg at 04/14/13 1038  . enoxaparin (LOVENOX) injection 40 mg  40 mg Subcutaneous Q24H Berkley Harvey, [facility]      . HYDROcodone-acetaminophen (NORCO/VICODIN) 5-325 MG per tablet 1-2 tablet  1-2 tablet Oral Q4H PRN Therisa Doyne, MD   2 tablet at 04/14/13 [phone]  . ondansetron (ZOFRAN) tablet 4 mg  4 mg Oral Q6H PRN Therisa Doyne, MD       Or  . ondansetron (ZOFRAN) injection 4 mg  4 mg Intravenous Q6H PRN Therisa Doyne, MD      . pantoprazole (PROTONIX) injection 40 mg  40 mg Intravenous QHS Anastassia Doutova, MD      . sodium chloride 0.9 % injection 3 mL  3 mL Intravenous Q12H Therisa Doyne, MD  Allergies as of 04/13/2013 - Review Complete 04/13/2013  Allergen Reaction Noted  . Gluten meal Other (See Comments) 10/02/2012  . Wheat bran Other (See Comments) 03/01/2012    Family History  Problem Relation Age of Onset  . Diabetes Maternal Aunt   . Diabetes Maternal Grandmother   . Colon cancer Neg Hx   . Crohn's disease Maternal Aunt     History   Social History  . Marital Status: Single    Spouse Name: N/A    Number of Children: N/A  . Years of Education: N/A   Occupational History  . unemployed    Social History Main Topics  . Smoking status: Former Smoker    Types: Cigarettes    Quit date: 01/24/2013  . Smokeless  tobacco: Never Used     Comment: 2-3 cigarettes per day  . Alcohol Use: No     Comment: rarely  . Drug Use: No  . Sexual Activity: Not on file   Other Topics Concern  . Not on file   Social History Narrative  . No narrative on file    Review of Systems: ROS Dr. Adela Glimpse 04/14/13 reviewed and I agree  Physical Exam: Vital signs in last 24 hours: Temp:  [97.9 F (36.6 C)-98.5 F (36.9 C)] 97.9 F (36.6 C) (12/20 0900) Pulse Rate:  [67-86] 67 (12/20 0900) Resp:  [16-18] 16 (12/20 0900) BP: (101-157)/(55-86) 101/60 mmHg (12/20 0900) SpO2:  [100 %] 100 % (12/20 0900) Weight:  [63 kg (138 lb 14.2 oz)] 63 kg (138 lb 14.2 oz) (12/20 0615) Last BM Date: 04/13/13 General:   Alert,  Somewhat cachectic- and thin-appearing in NAD Head:  Normocephalic and atraumatic. Eyes:  Sclera clear, no icterus.   Conjunctiva pale Ears:  Normal auditory acuity. Nose:  NGT in place; No deformity, discharge,  or lesions. Mouth:  No deformity or lesions.  Oropharynx pink & moist. Neck:  Supple; no masses or thyromegaly. Lungs:  Clear throughout to auscultation.   No wheezes, crackles, or rhonchi. No acute distress. Heart:  Regular rate and rhythm; no murmurs, clicks, rubs,  or gallops. Abdomen:  Soft, mild generalized tenderness, mild tympany, hypoactive but present bowel sounds. No masses, hepatosplenomegaly or hernias noted. No peritonitis     Msk:  Symmetrical without gross deformities. Normal posture. Pulses:  Normal pulses noted. Extremities:  Without clubbing or edema. Neurologic:  Alert and  oriented x4;  grossly normal neurologically. Skin:  Multiple tattoos.  Intact without significant lesions or rashes. Psych:  Alert and cooperative. Normal mood and affect.   Lab Results:  Recent Labs  04/13/13 1529 04/14/13 0418 04/14/13 1115  WBC 6.5 9.1 8.5  HGB 6.6* 6.9* 7.4*  HCT 21.6* 23.1* 24.3*  PLT 734* 706* 600*   BMET  Recent Labs  04/13/13 1529 04/14/13 0745  NA 144 140  K 4.0  4.0  CL 112 108  CO2 26 26  GLUCOSE 77 81  BUN 23 16  CREATININE 1.15 0.77  CALCIUM 8.5 8.6   LFT  Recent Labs  04/14/13 0745  PROT 5.5*  ALBUMIN 2.0*  AST 9  ALT 7  ALKPHOS 52  BILITOT 0.3   PT/INR No results found for this basename: LABPROT, INR,  in the last 72 hours  Studies/Results: Ct Abdomen Pelvis W Contrast  04/13/2013   CLINICAL DATA:  Crohn's and celiac disease. Abdominal distention, nausea, pain.  EXAM: CT ABDOMEN AND PELVIS WITH CONTRAST  TECHNIQUE: Multidetector CT imaging of the abdomen and pelvis was  performed using the standard protocol following bolus administration of intravenous contrast.  CONTRAST:  50mL OMNIPAQUE IOHEXOL 300 MG/ML SOLN, OMNIPAQUE IOHEXOL 300 MG/ML SOLN  COMPARISON:  10/05/2012  FINDINGS: Visualized lung bases clear. Increase in mild to moderate abdominal ascites. Unremarkable liver, nondistended gallbladder, spleen, adrenal glands, and kidneys, pancreas, aorta, portal vein. Stomach is nondilated. There is incomplete progression of the oral contrast material beyond the proximal small bowel which is decompressed.  There is a loop of a markedly dilated distal small bowel which protrudes into the pelvis, with circumferential wall thickening and mucosal enhancement. This is probably a loop of distal jejunum. There is a 2nd distended loop of small bowel the right mid abdomen with small bowel feces sign. Both are associated with a poorly marginated a right lower quadrant enhancing soft tissue region measuring approximately 5.7 cm transverse diameter, and near the region of the terminal ileum which is poorly defined. There are adjacent enlarged mesenteric lymph nodes. The colon is nondistended.  The urinary bladder is physiologically distended. No definite abscess. No free air.  IMPRESSION: 1. High-grade mid/distal small bowel obstruction, possibly at 2 separate levels, associated with a right lower quadrant enhancing inflammatory process, more  conspicuous than on prior exam. Suspect progressive active Crohn's disease. 2. No evidence of abscess or perforation. 3. Increase in abdominal ascites.   Electronically Signed   By: Oley Balm M.D.   On: 04/13/2013 19:24   Dg Abd Acute W/chest  04/13/2013   CLINICAL DATA:  Abdominal pain. History of Crohn's disease and celiac disease.  EXAM: ACUTE ABDOMEN SERIES (ABDOMEN 2 VIEW & CHEST 1 VIEW)  COMPARISON:  10/05/2012, CT of the abdomen pelvis.  FINDINGS: There is a distended loop small bowel in the left mid abdomen. No other bowel dilation is seen. There are air-fluid levels on the erect view. No free air.  The soft tissues are unremarkable.  Normal bony structures.  Included frontal radiograph of the chest shows a normal heart, mediastinum and hila and clear lungs.  IMPRESSION: 1. Single loop of distended small bowel in the left mid abdomen. No other bowel dilation. There are air-fluid levels on the erect view suggesting a diffuse adynamic ileus. These findings may reflect active inflammatory bowel disease. Recommend followup CT the abdomen pelvis with contrast. 2. No free air. 3. Normal chest radiograph.   Electronically Signed   By: Amie Portland M.D.   On: 04/13/2013 16:33    Impression:  1.  Partial small bowel obstruction, improving. 2.  Moderately severe Crohn's disease, likely accounting for #1 above. 3.  Abdominal pain and nausea/vomiting, likely from #1 above, improving. 4.  Iron-deficiency anemia.  Suspect from long-acting uncontrolled Crohn's disease. 5.  ?Celiac disease.  I doubt this diagnosis.  Celiac disease is extremely rare in African Americans.  Moreover, he had endoscopy with small bowel biopsies in 2010 showing no villous atrophy or intraepithelial lymphocytosis.  Furthermore, tissue transglutaminase levels, by far the most accurate screening serology for celiac disease, were normal.  His elevated antigliadin antibodies are highly likely false-positive test.   Plan:  1.   Clamp NGT and trial of sips of clears. 2.  Solumedrol 40 mg IV Q 8 hours for couple days, then try to transition to oral prednisone. 3.  Patient will need long-term medication for maintenance of remission, biologic agent (infliximab versus certolizumab versus adalimumab) would likely be best treatment for him.   4.  History of medical non-compliance, which concerns me.  While Pentasa or mesalamine  agent would be "safest" therapy, it will have scant efficacy in this patient's case.  Immunomodulators ( or azathioprine), while oral, would not only have more potential toxicities, but also would not likely work as well as biologic agents in this patient's case. 5.  Will follow.   LOS: 1 day   Franceen Erisman M  04/14/2013, 1:23 PM

## 2013-04-14 NOTE — Progress Notes (Addendum)
[facility] PROGRESS NOTE  Shane Klein XBJ:[id number] DOB: 1990/06/19 DOA: 04/13/2013 PCP: No PCP Per Patient  Assessment/Plan  Partial SBO due to crohn's disease.  Had not passed flatus at the time of my exam this morning, however, per surgery note, he is starting to pass flatus this morning -  Appreciate general surgery recommendations -  NG to LIS -  IV medications only for now  Crohn's disease -  Dr. Watt Climes is primary GI MD - will consult for considering of steroids  Hx of noncompliance.  Patient states his previous barriers were lack of insurance and being in jail.  He now has medical insurance and he just got out of jail.  He states he is ready to have a regular GI MD.  Iron deficiency anemia, likely GI losses despite neg hemoccult -  Agree with blood transfusion -  Consider additional iron infusions during admission -  Oral iron at discharge  Celiac disease -  Avoid wheat/gluten products -  vitamin B12 wnl -  Folate 6.6 wnl  Diet:  NPO Access:  PIV IVF:  Change to dextrose fluids  Proph:  lovenox  Code Status: full Family Communication: patient alone Disposition Plan: pending improvement in SBO/flare   Consultants:  General surgery  GI  Procedures:  NG placement  CT abd/pelvis  Antibiotics:  None   HPI/Subjective:  States that his abdominal cramping is now brief and intermittent and earlier it was constant.  + nausea.  No recent vomiting.  No flatus or BM since day prior.      Objective: Filed Vitals:   04/14/13 0615 04/14/13 0715 04/14/13 0815 04/14/13 0900  BP: 111/64 110/59 105/55 101/60  Pulse: 83 78 77 67  Temp: 98.1 F (36.7 C) 98.3 F (36.8 C) 98.1 F (36.7 C) 97.9 F (36.6 C)  TempSrc: Oral Oral Oral Oral  Resp: 16 18 17 16   Height: 5' 10"  (1.778 m)     Weight: 63 kg (138 lb 14.2 oz)     SpO2: 100% 100% 100% 100%    Intake/Output Summary (Last 24 hours) at 04/14/13 1203 Last data filed at 04/14/13 0900  Gross per 24  hour  Intake 679.17 ml  Output   1450 ml  Net -770.83 ml   Filed Weights   04/14/13 0615  Weight: 63 kg (138 lb 14.2 oz)    Exam:   General:  Thin AAM, No acute distress  HEENT:  NCAT, MMM  Cardiovascular:  RRR, nl S1, S2 no mrg, 2+ pulses, warm extremities  Respiratory:  CTAB, no increased WOB  Abdomen:   Hypoactive BS, high pitched, soft, mildly distended, mildly TTP RUQ and suprapubic area without rebound or guarding  MSK:   Normal tone and bulk, no LEE  Neuro:  Grossly intact  Data Reviewed: Basic Metabolic Panel:  Recent Labs Lab 04/13/13 1529 04/14/13 0745  NA 144 140  K 4.0 4.0  CL 112 108  CO2 26 26  GLUCOSE 77 81  BUN 23 16  CREATININE 1.15 0.77  CALCIUM 8.5 8.6  MG  --  1.8  PHOS  --  3.5   Liver Function Tests:  Recent Labs Lab 04/13/13 1529 04/14/13 0745  AST 10 9  ALT 6 7  ALKPHOS 53 52  BILITOT <0.1* 0.3  PROT 5.8* 5.5*  ALBUMIN 2.1* 2.0*    Recent Labs Lab 04/13/13 1529  LIPASE 55   No results found for this basename: AMMONIA,  in the last 168 hours CBC:  Recent Labs Lab 04/13/13 1529 04/14/13 0418 04/14/13 1115  WBC 6.5 9.1 8.5  NEUTROABS 3.7  --   --   HGB 6.6* 6.9* 7.4*  HCT 21.6* 23.1* 24.3*  MCV 63.0* 62.9* 65.3*  PLT 734* 706* 600*   Cardiac Enzymes: No results found for this basename: CKTOTAL, CKMB, CKMBINDEX, TROPONINI,  in the last 168 hours BNP (last 3 results) No results found for this basename: PROBNP,  in the last 8760 hours CBG: No results found for this basename: GLUCAP,  in the last 168 hours  No results found for this or any previous visit (from the past 240 hour(s)).   Studies: Ct Abdomen Pelvis W Contrast  04/13/2013   CLINICAL DATA:  Crohn's and celiac disease. Abdominal distention, nausea, pain.  EXAM: CT ABDOMEN AND PELVIS WITH CONTRAST  TECHNIQUE: Multidetector CT imaging of the abdomen and pelvis was performed using the standard protocol following bolus administration of intravenous  contrast.  CONTRAST:  79m OMNIPAQUE IOHEXOL 300 MG/ML SOLN, 1070mOMNIPAQUE IOHEXOL 300 MG/ML SOLN  COMPARISON:  10/05/2012  FINDINGS: Visualized lung bases clear. Increase in mild to moderate abdominal ascites. Unremarkable liver, nondistended gallbladder, spleen, adrenal glands, and kidneys, pancreas, aorta, portal vein. Stomach is nondilated. There is incomplete progression of the oral contrast material beyond the proximal small bowel which is decompressed.  There is a loop of a markedly dilated distal small bowel which protrudes into the pelvis, with circumferential wall thickening and mucosal enhancement. This is probably a loop of distal jejunum. There is a 2nd distended loop of small bowel the right mid abdomen with small bowel feces sign. Both are associated with a poorly marginated a right lower quadrant enhancing soft tissue region measuring approximately 5.7 cm transverse diameter, and near the region of the terminal ileum which is poorly defined. There are adjacent enlarged mesenteric lymph nodes. The colon is nondistended.  The urinary bladder is physiologically distended. No definite abscess. No free air.  IMPRESSION: 1. High-grade mid/distal small bowel obstruction, possibly at 2 separate levels, associated with a right lower quadrant enhancing inflammatory process, more conspicuous than on prior exam. Suspect progressive active Crohn's disease. 2. No evidence of abscess or perforation. 3. Increase in abdominal ascites.   Electronically Signed   By: DaArne Cleveland.D.   On: 04/13/2013 19:24   Dg Abd Acute W/chest  04/13/2013   CLINICAL DATA:  Abdominal pain. History of Crohn's disease and celiac disease.  EXAM: ACUTE ABDOMEN SERIES (ABDOMEN 2 VIEW & CHEST 1 VIEW)  COMPARISON:  10/05/2012, CT of the abdomen pelvis.  FINDINGS: There is a distended loop small bowel in the left mid abdomen. No other bowel dilation is seen. There are air-fluid levels on the erect view. No free air.  The soft tissues  are unremarkable.  Normal bony structures.  Included frontal radiograph of the chest shows a normal heart, mediastinum and hila and clear lungs.  IMPRESSION: 1. Single loop of distended small bowel in the left mid abdomen. No other bowel dilation. There are air-fluid levels on the erect view suggesting a diffuse adynamic ileus. These findings may reflect active inflammatory bowel disease. Recommend followup CT the abdomen pelvis with contrast. 2. No free air. 3. Normal chest radiograph.   Electronically Signed   By: DaLajean Manes.D.   On: 04/13/2013 16:33    Scheduled Meds: . docusate sodium  100 mg Oral BID  . pantoprazole (PROTONIX) IV  40 mg Intravenous QHS  . sodium chloride  3 mL Intravenous  Q12H   Continuous Infusions: . dextrose 5 % and 0.45 % NaCl with KCl 10 mEq/L 100 mL/hr at 04/14/13 8948    Active Problems:   Crohn's disease with intestinal obstruction   Celiac disease   Anemia   GERD (gastroesophageal reflux disease)   Small bowel obstruction    Time spent: 30 min    Denaisha Swango, [facility] Pager [phone]. If 7PM-7AM, please contact night-coverage at [email or web address], password [facility] 04/14/2013, 12:03 PM  LOS: 1 day

## 2013-04-14 NOTE — Progress Notes (Signed)
Patient ID: Shane Klein, male   DOB: 1990-12-08, 22 y.o.   MRN: [id number]    Subjective: He feels better today. Denies abdominal pain. Has had some flatus but no bowel movement.  Objective: Vital signs in last 24 hours: Temp:  [97.9 F (36.6 C)-98.5 F (36.9 C)] 97.9 F (36.6 C) (12/20 0900) Pulse Rate:  [67-86] 67 (12/20 0900) Resp:  [16-18] 16 (12/20 0900) BP: (101-157)/(55-86) 101/60 mmHg (12/20 0900) SpO2:  [100 %] 100 % (12/20 0900) Weight:  [138 lb 14.2 oz (63 kg)] 138 lb 14.2 oz (63 kg) (12/20 0615) Last BM Date: 04/13/13  Intake/Output from previous day: 12/19 0701 - 12/20 0700 In: 250 [I.V.:250] Out: 1450 [Urine:650] Intake/Output this shift: Total I/O In: 429.2 [Blood:429.2] Out: -   General appearance: alert, cooperative and no distress GI: mild distention improved from yesterday. Soft and nontender. No mass.  Lab Results:   Recent Labs  04/13/13 1529 04/14/13 0418  WBC 6.5 9.1  HGB 6.6* 6.9*  HCT 21.6* 23.1*  PLT 734* 706*   BMET  Recent Labs  04/13/13 1529 04/14/13 0745  NA 144 140  K 4.0 4.0  CL 112 108  CO2 26 26  GLUCOSE 77 81  BUN 23 16  CREATININE 1.15 0.77  CALCIUM 8.5 8.6     Studies/Results: Ct Abdomen Pelvis W Contrast  04/13/2013   CLINICAL DATA:  Crohn's and celiac disease. Abdominal distention, nausea, pain.  EXAM: CT ABDOMEN AND PELVIS WITH CONTRAST  TECHNIQUE: Multidetector CT imaging of the abdomen and pelvis was performed using the standard protocol following bolus administration of intravenous contrast.  CONTRAST:  71m OMNIPAQUE IOHEXOL 300 MG/ML SOLN, 1049mOMNIPAQUE IOHEXOL 300 MG/ML SOLN  COMPARISON:  10/05/2012  FINDINGS: Visualized lung bases clear. Increase in mild to moderate abdominal ascites. Unremarkable liver, nondistended gallbladder, spleen, adrenal glands, and kidneys, pancreas, aorta, portal vein. Stomach is nondilated. There is incomplete progression of the oral contrast material beyond the proximal small  bowel which is decompressed.  There is a loop of a markedly dilated distal small bowel which protrudes into the pelvis, with circumferential wall thickening and mucosal enhancement. This is probably a loop of distal jejunum. There is a 2nd distended loop of small bowel the right mid abdomen with small bowel feces sign. Both are associated with a poorly marginated a right lower quadrant enhancing soft tissue region measuring approximately 5.7 cm transverse diameter, and near the region of the terminal ileum which is poorly defined. There are adjacent enlarged mesenteric lymph nodes. The colon is nondistended.  The urinary bladder is physiologically distended. No definite abscess. No free air.  IMPRESSION: 1. High-grade mid/distal small bowel obstruction, possibly at 2 separate levels, associated with a right lower quadrant enhancing inflammatory process, more conspicuous than on prior exam. Suspect progressive active Crohn's disease. 2. No evidence of abscess or perforation. 3. Increase in abdominal ascites.   Electronically Signed   By: DaArne Cleveland.D.   On: 04/13/2013 19:24   Dg Abd Acute W/chest  04/13/2013   CLINICAL DATA:  Abdominal pain. History of Crohn's disease and celiac disease.  EXAM: ACUTE ABDOMEN SERIES (ABDOMEN 2 VIEW & CHEST 1 VIEW)  COMPARISON:  10/05/2012, CT of the abdomen pelvis.  FINDINGS: There is a distended loop small bowel in the left mid abdomen. No other bowel dilation is seen. There are air-fluid levels on the erect view. No free air.  The soft tissues are unremarkable.  Normal bony structures.  Included frontal radiograph of  the chest shows a normal heart, mediastinum and hila and clear lungs.  IMPRESSION: 1. Single loop of distended small bowel in the left mid abdomen. No other bowel dilation. There are air-fluid levels on the erect view suggesting a diffuse adynamic ileus. These findings may reflect active inflammatory bowel disease. Recommend followup CT the abdomen pelvis  with contrast. 2. No free air. 3. Normal chest radiograph.   Electronically Signed   By: Lajean Manes M.D.   On: 04/13/2013 16:33    Anti-infectives: Anti-infectives   None      Assessment/Plan: Crohn's disease with layer and bowel obstruction. History of noncompliance. He is improved today with bowel rest. Medical team to discuss IV steroids with GI and I suspect this would be indicated. Severe iron deficiency anemia with plans for transfusion. No surgical intervention at present.    LOS: 1 day    Launi Asencio T 04/14/2013

## 2013-04-14 NOTE — H&P (Signed)
PCP: none [facility]  Chief Complaint:  abdominal pain  HPI: Shane Klein Shane Klein is a 22 y.o. male   has a past medical history of Silicatosis; Celiac disease; Crohn's disease with intestinal obstruction (03/01/2012); Iron deficiency anemia; and GERD (gastroesophageal reflux disease).   Presented with  24 hour hx of abdominal pain denies nausea and vomiting. Yesterday he had some diarrhea associated with this. Patient has hx of Crohn's disease but has not been compliant with his regimen. HE has been on prednisone in the past but has not continued. HE self discontinued Entocort after prescription ran out and he was not able to follow up. Patient presented to ER today and was found to have SBO per CT. He has been seen by surgery who at this point recommend medicine admission with conservative management and surgery will follow as consultants. Patient is improving after NG placement.  Patient has hx of anemia due to iron deficiency. He has been incarcerated and states he has not been taking iron supplements. Denies recent blood in stool, no black stools. He has required blood transfusions in the past.   Review of Systems:    Pertinent positives include: chills, abdominal pain, diarrhea,  nausea, states Left leg was a bit swollen few day ago now resolved.   Constitutional:  No weight loss, night sweats, Fevers,  fatigue, weight loss  HEENT:  No headaches, Difficulty swallowing,Tooth/dental problems,Sore throat,  No sneezing, itching, ear ache, nasal congestion, post nasal drip,  Cardio-vascular:  No chest pain, Orthopnea, PND, anasarca, dizziness, palpitations.no Bilateral lower extremity swelling  GI:  No heartburn, indigestion,vomiting, change in bowel habits, loss of appetite, melena, blood in stool, hematemesis Resp:  no shortness of breath at rest. No dyspnea on exertion, No excess mucus, no productive cough, No non-productive cough, No coughing up of blood.No change in color of mucus.No  wheezing. Skin:  no rash or lesions. No jaundice GU:  no dysuria, change in color of urine, no urgency or frequency. No straining to urinate.  No flank pain.  Musculoskeletal:  No joint pain or no joint swelling. No decreased range of motion. No back pain.  Psych:  No change in mood or affect. No depression or anxiety. No memory loss.  Neuro: no localizing neurological complaints, no tingling, no weakness, no double vision, no gait abnormality, no slurred speech, no confusion  Otherwise ROS are negative except for above, 10 systems were reviewed  Past Medical History: Past Medical History  Diagnosis Date  . 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. Celiac disease     per patient. Negative biopsies  . Crohn's disease with intestinal obstruction 03/01/2012  . Iron deficiency anemia   . GERD (gastroesophageal reflux disease)    Past Surgical History  Procedure Laterality Date  . Esophagogastroduodenoscopy  Oct 2010    Dr. Fuller Plan: duodenitis, normal villi, likely Crohn's. Elevated Gliadin but normal TTG, IgA and IgA  . Colonoscopy  Oct 2010    Dr. Fuller Plan: evidence of Crohn's at cecum, ascending colon, unable to intubate the terminal ileum. CTE with distal and terminal ileitis     Medications: Prior to Admission medications   Medication Sig Start Date End Date Taking? Authorizing Provider  budesonide (ENTOCORT EC) 3 MG 24 hr capsule Take 3 capsules (9 mg total) by mouth every morning. 11/20/12  Yes Orvil Feil, NP  omeprazole (PRILOSEC) 20 MG capsule Take 1 capsule (20 mg total) by mouth daily. 11/20/12  Yes Orvil Feil, NP    Allergies:   Allergies  Allergen Reactions  . Gluten Meal Other (See Comments)    Stomach pain  . Wheat Bran Other (See Comments)    Belly pain    Social History:  Ambulatory   independently   Lives at  Home with family   reports that he quit smoking about 2 months ago. His smoking use included Cigarettes. He smoked 0.00 packs per day. He has never used smokeless tobacco. He  reports that he does not drink alcohol or use illicit drugs.   Family History: family history includes Crohn's disease in his maternal aunt; Diabetes in his maternal aunt and maternal grandmother. There is no history of Colon cancer.    Physical Exam: Patient Vitals for the past 24 hrs:  BP Temp Temp src Pulse Resp SpO2  04/14/13 0308 132/71 mmHg - - 81 16 100 %  04/13/13 1857 145/75 mmHg 98.4 F (36.9 C) Oral 86 16 100 %  04/13/13 1853 157/73 mmHg - - 81 - 100 %  04/13/13 1443 147/86 mmHg 98.5 F (36.9 C) Oral 77 16 100 %    1. General:  in No Acute distress 2. Psychological: Alert and Oriented 3. Head/ENT:   Moist   Mucous Membranes                          Head Non traumatic, neck supple                          Normal  Dentition 4. SKIN:   decreased Skin turgor,  Skin clean Dry and intact no rash 5. Heart: Regular rate and rhythm no Murmur, Rub or gallop 6. Lungs: Clear to auscultation bilaterally, no wheezes or crackles   7. Abdomen: Soft, peri umbilical tenderness, Non distended, NG in place with 500 ml of aspirated liquid 8. Lower extremities: no clubbing, cyanosis, or edema 9. Neurologically Grossly intact, moving all 4 extremities equally 10. MSK: Normal range of motion  body mass index is unknown because there is no weight on file.   Labs on Admission:   Recent Labs  04/13/13 1529  NA 144  K 4.0  CL 112  CO2 26  GLUCOSE 77  BUN 23  CREATININE 1.15  CALCIUM 8.5    Recent Labs  04/13/13 1529  AST 10  ALT 6  ALKPHOS 53  BILITOT <0.1*  PROT 5.8*  ALBUMIN 2.1*    Recent Labs  04/13/13 1529  LIPASE 55    Recent Labs  04/13/13 1529  WBC 6.5  NEUTROABS 3.7  HGB 6.6*  HCT 21.6*  MCV 63.0*  PLT 734*   No results found for this basename: CKTOTAL, CKMB, CKMBINDEX, TROPONINI,  in the last 72 hours No results found for this basename: TSH, T4TOTAL, FREET3, T3FREE, THYROIDAB,  in the last 72 hours  Recent Labs  04/13/13 1716  VITAMINB12 438   FOLATE 6.6  FERRITIN 6*  TIBC Not calculated due to Iron <10.  IRON <10*  RETICCTPCT 1.0   No results found for this basename: HGBA1C    The CrCl is unknown because both a height and weight (above a minimum accepted value) are required for this calculation. ABG    Component Value Date/Time   HCO3 24.6* 03/31/2007 0104   TCO2 29 04/24/2009 1445   ACIDBASEDEF 2.0 03/31/2007 0104     Lab Results  Component Value Date   DDIMER  Value: 1.59  AT THE INHOUSE ESTABLISHED CUTOFF VALUE OF 0.48 ug/mL FEU, THIS ASSAY HAS BEEN DOCUMENTED IN THE LITERATURE TO HAVE* 03/31/2007      Cultures:    Component Value Date/Time   SDES URINE, CLEAN CATCH 06/22/2012 1313   SPECREQUEST NONE 06/22/2012 1313   CULT NO GROWTH 06/22/2012 1313   REPTSTATUS 06/24/2012 FINAL 06/22/2012 1313       Radiological Exams on Admission: Ct Abdomen Pelvis W Contrast  04/13/2013   CLINICAL DATA:  Crohn's and celiac disease. Abdominal distention, nausea, pain.  EXAM: CT ABDOMEN AND PELVIS WITH CONTRAST  TECHNIQUE: Multidetector CT imaging of the abdomen and pelvis was performed using the standard protocol following bolus administration of intravenous contrast.  CONTRAST:  15m OMNIPAQUE IOHEXOL 300 MG/ML SOLN, 1032mOMNIPAQUE IOHEXOL 300 MG/ML SOLN  COMPARISON:  10/05/2012  FINDINGS: Visualized lung bases clear. Increase in mild to moderate abdominal ascites. Unremarkable liver, nondistended gallbladder, spleen, adrenal glands, and kidneys, pancreas, aorta, portal vein. Stomach is nondilated. There is incomplete progression of the oral contrast material beyond the proximal small bowel which is decompressed.  There is a loop of a markedly dilated distal small bowel which protrudes into the pelvis, with circumferential wall thickening and mucosal enhancement. This is probably a loop of distal jejunum. There is a 2nd distended loop of small bowel the right mid abdomen with small bowel feces sign. Both are associated with a  poorly marginated a right lower quadrant enhancing soft tissue region measuring approximately 5.7 cm transverse diameter, and near the region of the terminal ileum which is poorly defined. There are adjacent enlarged mesenteric lymph nodes. The colon is nondistended.  The urinary bladder is physiologically distended. No definite abscess. No free air.  IMPRESSION: 1. High-grade mid/distal small bowel obstruction, possibly at 2 separate levels, associated with a right lower quadrant enhancing inflammatory process, more conspicuous than on prior exam. Suspect progressive active Crohn's disease. 2. No evidence of abscess or perforation. 3. Increase in abdominal ascites.   Electronically Signed   By: DaArne Cleveland.D.   On: 04/13/2013 19:24   Dg Abd Acute W/chest  04/13/2013   CLINICAL DATA:  Abdominal pain. History of Crohn's disease and celiac disease.  EXAM: ACUTE ABDOMEN SERIES (ABDOMEN 2 VIEW & CHEST 1 VIEW)  COMPARISON:  10/05/2012, CT of the abdomen pelvis.  FINDINGS: There is a distended loop small bowel in the left mid abdomen. No other bowel dilation is seen. There are air-fluid levels on the erect view. No free air.  The soft tissues are unremarkable.  Normal bony structures.  Included frontal radiograph of the chest shows a normal heart, mediastinum and hila and clear lungs.  IMPRESSION: 1. Single loop of distended small bowel in the left mid abdomen. No other bowel dilation. There are air-fluid levels on the erect view suggesting a diffuse adynamic ileus. These findings may reflect active inflammatory bowel disease. Recommend followup CT the abdomen pelvis with contrast. 2. No free air. 3. Normal chest radiograph.   Electronically Signed   By: DaLajean Manes.D.   On: 04/13/2013 16:33    Chart has been reviewed  Assessment/Plan  [age]ear old male history of Crohn's associated disease presents with abdominal pain thought to have small bowel obstruction likely related to his Crohn's  Present on  Admission:  . Small bowel obstruction - conservative management for now continue NG tube. Surgery is aware  . Crohn's disease with intestinal obstruction - would need to discuss in the morning but GI to see if  he will be a good candidate for initiation of steroids patient has history of noncompliance  . Anemia - hemoglobin below 7 we'll transfuse one unit and follow patients Hemoccult negative. He has not been taking his iron supplements   Prophylaxis: SCD , Protonix  CODE STATUS: FULL CODE  Other plan as per orders.  I have spent a total of 55 min on this admission  Melanie Pellot 04/14/2013, 4:19 AM

## 2013-04-14 NOTE — Progress Notes (Signed)
*  Preliminary Results* Left lower extremity venous duplex completed. Left lower extremity is negative for deep vein thrombosis. There is no evidence of left Baker's cyst.  04/14/2013 8:36 AM  Gertie Fey, RVT, RDCS, RDMS

## 2013-04-15 ENCOUNTER — Inpatient Hospital Stay (HOSPITAL_COMMUNITY): Payer: Medicaid Other

## 2013-04-15 ENCOUNTER — Encounter (HOSPITAL_COMMUNITY): Payer: Self-pay | Admitting: Internal Medicine

## 2013-04-15 DIAGNOSIS — E43 Unspecified severe protein-calorie malnutrition: Secondary | ICD-10-CM | POA: Insufficient documentation

## 2013-04-15 DIAGNOSIS — R109 Unspecified abdominal pain: Secondary | ICD-10-CM

## 2013-04-15 HISTORY — DX: Unspecified severe protein-calorie malnutrition: E43

## 2013-04-15 LAB — BASIC METABOLIC PANEL
BUN: 13 mg/dL (ref 6–23)
Creatinine, Ser: 0.72 mg/dL (ref 0.50–1.35)
GFR calc Af Amer: >90 mL/min (ref >90)
GFR calc non Af Amer: >90 mL/min (ref >90)
Glucose, Bld: 118 mg/dL — ABNORMAL HIGH (ref 70–99)
Potassium: 4.4 mEq/L (ref 3.5–5.1)

## 2013-04-15 LAB — CBC
Hemoglobin: 7.6 g/dL — ABNORMAL LOW (ref 13.0–17.0)
MCH: 19.7 pg — ABNORMAL LOW (ref 26.0–34.0)
MCHC: 30.6 g/dL (ref 30.0–36.0)
RBC: 3.86 MIL/uL — ABNORMAL LOW (ref 4.22–5.81)
RDW: 20.8 % — ABNORMAL HIGH (ref 11.5–15.5)

## 2013-04-15 LAB — TYPE AND SCREEN
Antibody Screen: NEGATIVE
Unit division: 0

## 2013-04-15 MED ORDER — BOOST / RESOURCE BREEZE PO LIQD
1.0000 | Freq: Three times a day (TID) | ORAL | Status: DC
  Administered 2013-04-15 – 2013-04-17 (×6): 1 via ORAL

## 2013-04-15 MED ORDER — LACTATED RINGERS IV BOLUS (SEPSIS): 1000.0000 mL | Freq: Three times a day (TID) | INTRAVENOUS | Status: AC | PRN

## 2013-04-15 MED ORDER — MENTHOL 3 MG MT LOZG
1.0000 | LOZENGE | OROMUCOSAL | Status: DC | PRN
  Filled 2013-04-15: qty 9

## 2013-04-15 MED ORDER — FERUMOXYTOL INJECTION 510 MG/17 ML
510.0000 mg | Freq: Once | INTRAVENOUS | Status: AC
  Administered 2013-04-15: 10:00:00 510 mg via INTRAVENOUS
  Filled 2013-04-15: qty 17

## 2013-04-15 MED ORDER — PHENOL 1.4 % MT LIQD
2.0000 | OROMUCOSAL | Status: DC | PRN
  Filled 2013-04-15: qty 177

## 2013-04-15 MED ORDER — LIP MEDEX EX OINT
1.0000 "application " | TOPICAL_OINTMENT | Freq: Two times a day (BID) | CUTANEOUS | Status: DC
  Administered 2013-04-15 – 2013-04-17 (×5): 1 via TOPICAL
  Filled 2013-04-15: qty 7

## 2013-04-15 MED ORDER — METOPROLOL TARTRATE 1 MG/ML IV SOLN: 5.0000 mg | Freq: Four times a day (QID) | INTRAVENOUS | Status: DC | PRN

## 2013-04-15 MED ORDER — MAGIC MOUTHWASH
15.0000 mL | Freq: Four times a day (QID) | ORAL | Status: DC | PRN
  Filled 2013-04-15: qty 15

## 2013-04-15 MED ORDER — DIPHENHYDRAMINE HCL 50 MG/ML IJ SOLN: 12.5000 mg | Freq: Four times a day (QID) | INTRAMUSCULAR | Status: DC | PRN

## 2013-04-15 MED ORDER — ALUM & MAG HYDROXIDE-SIMETH 200-200-20 MG/5ML PO SUSP
30.0000 mL | Freq: Four times a day (QID) | ORAL | Status: DC | PRN
  Administered 2013-04-16: 23:00:00 30 mL via ORAL
  Filled 2013-04-15: qty 30

## 2013-04-15 MED ORDER — PROMETHAZINE HCL 25 MG/ML IJ SOLN: 12.5000 mg | Freq: Four times a day (QID) | INTRAMUSCULAR | Status: DC | PRN

## 2013-04-15 NOTE — Progress Notes (Signed)
INITIAL NUTRITION ASSESSMENT  DOCUMENTATION CODES Per approved criteria  -Severe malnutrition in the context of chronic illness   INTERVENTION:  Resource Breeze tid  Monitor diet advancement per MD.  Recommend Low fat, Low Fiber Gluten Free  NUTRITION DIAGNOSIS: Inadequate oral intake related to altered GI function as evidenced by clear liquid diet..   Goal: Tolerance of diet advancement to solids  Monitor:  Diet advancement, labs, weight, po intake.  Reason for Assessment: mst  22 y.o. male  Admitting Dx: <principal problem not specified>  ASSESSMENT: Patient with PSBO from adhesions vs crohn's reflair-improving clinically.  Patient had not had follow up for his chron's in the last four years. Patient reports that he has celiac disease (negative biopsies).    Patient states that he does not tolerate gluten, potatoes or any type of beans which cause abdominal pain.  Drinks skim milk but cannot tolerate whole milk which causes abdominal pain and diarrhea.    Tolerating clear liquid diet.  Performed a limited Nutrition Focussed physical exam secondary to IV came out prior to this when patient stood up.    Patient meets criteria for severe malnutrition related to chronic illness AEB decreased body fat , intake <75% for > 1 month, and 19% weight loss in the past 5 months.    Nutrition Focused Physical Exam:  Subcutaneous Fat:  Orbital Region: mild Upper Arm Region: severe Thoracic and Lumbar Region: mild  Muscle:  Temple Region: mild Clavicle Bone Region: mild Clavicle and Acromion Bone Region: wnl Scapular Bone Region: n/a Dorsal Hand: n/a Patellar Region: wnl Anterior Thigh Region: wnl Posterior Calf Region: wnl  Edema: none noted   Height: Ht Readings from Last 1 Encounters:  04/14/13 5\' 10"  (1.778 m)    Weight: Wt Readings from Last 1 Encounters:  04/14/13 138 lb 14.2 oz (63 kg)    Ideal Body Weight: 166 lbs  % Ideal Body Weight: 84  Wt Readings  from Last 10 Encounters:  04/14/13 138 lb 14.2 oz (63 kg)  11/20/12 159 lb 9.6 oz (72.394 kg)  11/15/12 170 lb (77.111 kg)  11/13/12 170 lb (77.111 kg)  10/07/12 170 lb (77.111 kg)  03/01/12 174 lb 12.8 oz (79.289 kg)  05/16/09 174 lb 12.8 oz (79.289 kg) (80%*, Z = 0.85)  04/02/09 170 lb (77.111 kg) (76%*, Z = 0.72)  01/27/09 168 lb (76.204 kg) (75%*, Z = 0.68)   * Growth percentiles are based on CDC 2-20 Years data.    Usual Body Weight: 170 lbs  % Usual Body Weight: 81% of weight 5 months ago  BMI:  Body mass index is 19.93 kg/(m^2).  Estimated Nutritional Needs: Kcal: 2100-2300 Protein: 80-90 gm Fluid: >2L daily  Skin: wnl  Diet Order: Clear Liquid, 2gm Na with 2L fluid restriction  EDUCATION NEEDS: -No education needs identified at this time   Intake/Output Summary (Last 24 hours) at 04/15/13 1350 Last data filed at 04/15/13 0900  Gross per 24 hour  Intake 2071.67 ml  Output      0 ml  Net 2071.67 ml      Labs:   Recent Labs Lab 04/13/13 1529 04/14/13 0745 04/15/13 0425  NA 144 140 136  K 4.0 4.0 4.4  CL 112 108 106  CO2 26 26 24   BUN 23 16 13   CREATININE 1.15 0.77 0.72  CALCIUM 8.5 8.6 8.9  MG  --  1.8  --   PHOS  --  3.5  --   GLUCOSE 77 81 118*  CBG (last 3)  No results found for this basename: GLUCAP,  in the last 72 hours  Scheduled Meds: . enoxaparin (LOVENOX) injection  40 mg Subcutaneous Q24H  . lip balm  1 application Topical BID  . methylPREDNISolone (SOLU-MEDROL) injection  40 mg Intravenous TID  . pantoprazole (PROTONIX) IV  40 mg Intravenous QHS  . sodium chloride  3 mL Intravenous Q12H    Continuous Infusions: . dextrose 5 % and 0.45 % NaCl with KCl 10 mEq/L 100 mL/hr at 04/14/13 1843    Past Medical History  Diagnosis Date  . 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. Celiac disease     per patient. Negative biopsies  . Crohn's disease with intestinal obstruction 03/01/2012  . Iron deficiency anemia   . GERD (gastroesophageal reflux  disease)     Past Surgical History  Procedure Laterality Date  . Esophagogastroduodenoscopy  Oct 2010    Dr. Russella Dar: duodenitis, normal villi, likely Crohn's. Elevated Gliadin but normal TTG, IgA and IgA  . Colonoscopy  Oct 2010    Dr. Russella Dar: evidence of Crohn's at cecum, ascending colon, unable to intubate the terminal ileum. CTE with distal and terminal ileitis    Oran Rein, RD, LDN Clinical Inpatient Dietitian Pager:  [phone] Weekend and after hours pager:  [phone]

## 2013-04-15 NOTE — Progress Notes (Signed)
Shane Klein [id number] Mar 02, 1991  CARE TEAM:  PCP: No PCP Per Patient  Outpatient Care Team: Patient Care Team: No Pcp Per Patient as PCP - General ([place]) [name], MD as Attending Physician (Gastroenterology)  Inpatient Treatment Team: Treatment Team: Attending Provider: [name], MD; Consulting Physician: [name], MD; Rounding Team: Garner Gavel, MD; Registered Nurse: Morrison Old, RN; Registered Nurse: Midge Minium, RN; Technician: Merdis Delay Major, NT   Subjective:  Feels better Tol clamping  Objective:  Vital signs:  Filed Vitals:   04/14/13 0900 04/14/13 2058 04/15/13 0100 04/15/13 0526  BP: 101/60 111/57 109/58 102/57  Pulse: 67 67 64 68  Temp: 97.9 F (36.6 C) 97.9 F (36.6 C) 97.9 F (36.6 C) 97.9 F (36.6 C)  TempSrc: Oral Oral Oral Oral  Resp: 16 16 16 18   Height:      Weight:      SpO2: 100% 99% 97% 95%    Last BM Date: 04/13/13  Intake/Output   Yesterday:  12/20 0701 - 12/21 0700 In: 2500.8 [I.V.:2071.7; Blood:429.2] Out: -  This shift:     Bowel function:  Flatus: no  BM: small BM  Drain: n/a  Physical Exam:  General: Pt awake/alert/oriented x4 in no acute distress Eyes: PERRL, normal EOM.  Sclera clear.  No icterus Neuro: CN II-XII intact w/o focal sensory/motor deficits. Lymph: No head/neck/groin lymphadenopathy Psych:  No delerium/psychosis/paranoia HENT: Normocephalic, Mucus membranes moist.  No thrush Neck: Supple, No tracheal deviation Chest: No chest wall pain w good excursion CV:  Pulses intact.  Regular rhythm MS: Normal AROM mjr joints.  No obvious deformity Abdomen: Soft.  Nondistended.  Nontender.  Abd soft/flat.  No evidence of peritonitis.  No incarcerated hernias. Ext:  SCDs BLE.  No mjr edema.  No cyanosis Skin: No petechiae / purpura   Problem List:   Active Problems:   Crohn's disease with intestinal obstruction   Celiac disease   Anemia   GERD (gastroesophageal  reflux disease)   Small bowel obstruction   Assessment  Shane Klein  22 y.o. male       PSBO from adhesions vs Crohn's reflare - improving clinically  Plan:  -Xrays today -try clears -If AXR improved & tol clears w NGT clamped w low residual >6hours, OK to NGT NGT later today -agree w immunomod regimen to control Crohn's, starting w IV steroids.  Defer to GI.  Outpt GI followup essential in breaking this cycle of PSBO/flares, though an obvious challenge w prior financial & legal issues. -VTE prophylaxis- SCDs, etc -mobilize as tolerated to help recovery  [name], M.D., F.A.C.S. Gastrointestinal and Minimally Invasive Surgery [facility] [address], [place], [place] [address] [phone] Main / Paging   04/15/2013   Results:   Labs: Results for orders placed during the hospital encounter of 04/13/13 (from the past 48 hour(s))  CBC WITH DIFFERENTIAL     Status: Abnormal   Collection Time    04/13/13  3:29 PM      Result Value Range   WBC 6.5  4.0 - 10.5 K/uL   RBC 3.43 (*) 4.22 - 5.81 MIL/uL   Hemoglobin 6.6 (*) 13.0 - 17.0 g/dL   Comment: REPEATED TO VERIFY     CRITICAL RESULT CALLED TO, READ BACK BY AND VERIFIED WITH:     M.ROSSER RN AT 1613 ON 19DEC14 BY C.BONGEL   HCT 21.6 (*) 39.0 - 52.0 %   MCV  63.0 (*) 78.0 - 100.0 fL   MCH 19.2 (*) 26.0 - 34.0 pg   MCHC 30.6  30.0 - 36.0 g/dL   RDW 19.6 (*) 11.5 - 15.5 %   Platelets 734 (*) 150 - 400 K/uL   Neutrophils Relative % 57  43 - 77 %   Lymphocytes Relative 22  12 - 46 %   Monocytes Relative 16 (*) 3 - 12 %   Eosinophils Relative 4  0 - 5 %   Basophils Relative 1  0 - 1 %   Neutro Abs 3.7  1.7 - 7.7 K/uL   Lymphs Abs 1.4  0.7 - 4.0 K/uL   Monocytes Absolute 1.0  0.1 - 1.0 K/uL   Eosinophils Absolute 0.3  0.0 - 0.7 K/uL   Basophils Absolute 0.1  0.0 - 0.1 K/uL   RBC Morphology TARGET CELLS    COMPREHENSIVE METABOLIC PANEL     Status: Abnormal   Collection  Time    04/13/13  3:29 PM      Result Value Range   Sodium 144  135 - 145 mEq/L   Potassium 4.0  3.5 - 5.1 mEq/L   Chloride 112  96 - 112 mEq/L   CO2 26  19 - 32 mEq/L   Glucose, Bld 77  70 - 99 mg/dL   BUN 23  6 - 23 mg/dL   Creatinine, Ser 1.15  0.50 - 1.35 mg/dL   Calcium 8.5  8.4 - 10.5 mg/dL   Total Protein 5.8 (*) 6.0 - 8.3 g/dL   Albumin 2.1 (*) 3.5 - 5.2 g/dL   AST 10  0 - 37 U/L   ALT 6  0 - 53 U/L   Alkaline Phosphatase 53  39 - 117 U/L   Total Bilirubin <0.1 (*) 0.3 - 1.2 mg/dL   GFR calc non Af Amer 89 (*) >90 mL/min   GFR calc Af Amer >90  >90 mL/min   Comment: (NOTE)     The eGFR has been calculated using the CKD EPI equation.     This calculation has not been validated in all clinical situations.     eGFR's persistently <90 mL/min signify possible Chronic Kidney     Disease.  LIPASE, BLOOD     Status: None   Collection Time    04/13/13  3:29 PM      Result Value Range   Lipase 55  11 - 59 U/L  OCCULT BLOOD, POC DEVICE     Status: None   Collection Time    04/13/13  4:51 PM      Result Value Range   Fecal Occult Bld NEGATIVE  NEGATIVE  VITAMIN B12     Status: None   Collection Time    04/13/13  5:16 PM      Result Value Range   Vitamin B-12 438  211 - 911 pg/mL   Comment: Performed at [facility]     Status: None   Collection Time    04/13/13  5:16 PM      Result Value Range   Folate 6.6     Comment: (NOTE)     Reference Ranges            Deficient:       0.4 - 3.3 ng/mL            Indeterminate:   3.4 - 5.4 ng/mL            Normal:              >  5.4 ng/mL     Performed at [facility] TIBC     Status: Abnormal   Collection Time    04/13/13  5:16 PM      Result Value Range   Iron <10 (*) 42 - 135 ug/dL   TIBC Not calculated due to Iron <10.  215 - 435 ug/dL   Saturation Ratios Not calculated due to Iron <10.  20 - 55 %   UIBC 290  125 - 400 ug/dL   Comment: Performed at [facility]      Status: Abnormal   Collection Time    04/13/13  5:16 PM      Result Value Range   Ferritin 6 (*) 22 - 322 ng/mL   Comment: Performed at [facility]     Status: Abnormal   Collection Time    04/13/13  5:16 PM      Result Value Range   Retic Ct Pct 1.0  0.4 - 3.1 %   RBC. 3.82 (*) 4.22 - 5.81 MIL/uL   Retic Count, Manual 38.2  19.0 - 186.0 K/uL  CBC     Status: Abnormal   Collection Time    04/14/13  4:18 AM      Result Value Range   WBC 9.1  4.0 - 10.5 K/uL   RBC 3.67 (*) 4.22 - 5.81 MIL/uL   Hemoglobin 6.9 (*) 13.0 - 17.0 g/dL   Comment: REPEATED TO VERIFY     CRITICAL RESULT CALLED TO, READ BACK BY AND VERIFIED WITH:     SPOKE SHELL,A RN [id number] HAMER,N   HCT 23.1 (*) 39.0 - 52.0 %   MCV 62.9 (*) 78.0 - 100.0 fL   MCH 18.8 (*) 26.0 - 34.0 pg   MCHC 29.9 (*) 30.0 - 36.0 g/dL   RDW 19.6 (*) 11.5 - 15.5 %   Platelets 706 (*) 150 - 400 K/uL  TYPE AND SCREEN     Status: None   Collection Time    04/14/13  4:18 AM      Result Value Range   ABO/RH(D) A POS     Antibody Screen NEG     Sample Expiration 04/17/2013     Unit Number [id number]     Blood Component Type RED CELLS,LR     Unit division 00     Status of Unit ISSUED     Transfusion Status OK TO TRANSFUSE     Crossmatch Result Compatible    PREPARE RBC (CROSSMATCH)     Status: None   Collection Time    04/14/13  5:30 AM      Result Value Range   Order Confirmation ORDER PROCESSED BY BLOOD BANK    MAGNESIUM     Status: None   Collection Time    04/14/13  7:45 AM      Result Value Range   Magnesium 1.8  1.5 - 2.5 mg/dL  PHOSPHORUS     Status: None   Collection Time    04/14/13  7:45 AM      Result Value Range   Phosphorus 3.5  2.3 - 4.6 mg/dL  TSH     Status: None   Collection Time    04/14/13  7:45 AM      Result Value Range   TSH 1.833  0.350 - 4.500 uIU/mL   Comment: Performed at [facility]     Status: Abnormal  Collection Time     04/14/13  7:45 AM      Result Value Range   Sodium 140  135 - 145 mEq/L   Potassium 4.0  3.5 - 5.1 mEq/L   Chloride 108  96 - 112 mEq/L   CO2 26  19 - 32 mEq/L   Glucose, Bld 81  70 - 99 mg/dL   BUN 16  6 - 23 mg/dL   Creatinine, Ser 0.77  0.50 - 1.35 mg/dL   Calcium 8.6  8.4 - 10.5 mg/dL   Total Protein 5.5 (*) 6.0 - 8.3 g/dL   Albumin 2.0 (*) 3.5 - 5.2 g/dL   AST 9  0 - 37 U/L   ALT 7  0 - 53 U/L   Alkaline Phosphatase 52  39 - 117 U/L   Total Bilirubin 0.3  0.3 - 1.2 mg/dL   GFR calc non Af Amer >90  >90 mL/min   GFR calc Af Amer >90  >90 mL/min   Comment: (NOTE)     The eGFR has been calculated using the CKD EPI equation.     This calculation has not been validated in all clinical situations.     eGFR's persistently <90 mL/min signify possible Chronic Kidney     Disease.  CBC     Status: Abnormal   Collection Time    04/14/13 11:15 AM      Result Value Range   WBC 8.5  4.0 - 10.5 K/uL   RBC 3.72 (*) 4.22 - 5.81 MIL/uL   Hemoglobin 7.4 (*) 13.0 - 17.0 g/dL   HCT 24.3 (*) 39.0 - 52.0 %   MCV 65.3 (*) 78.0 - 100.0 fL   MCH 19.9 (*) 26.0 - 34.0 pg   MCHC 30.5  30.0 - 36.0 g/dL   RDW 20.9 (*) 11.5 - 15.5 %   Platelets 600 (*) 150 - 400 K/uL  BASIC METABOLIC PANEL     Status: Abnormal   Collection Time    04/15/13  4:25 AM      Result Value Range   Sodium 136  135 - 145 mEq/L   Potassium 4.4  3.5 - 5.1 mEq/L   Chloride 106  96 - 112 mEq/L   CO2 24  19 - 32 mEq/L   Glucose, Bld 118 (*) 70 - 99 mg/dL   BUN 13  6 - 23 mg/dL   Creatinine, Ser 0.72  0.50 - 1.35 mg/dL   Calcium 8.9  8.4 - 10.5 mg/dL   GFR calc non Af Amer >90  >90 mL/min   GFR calc Af Amer >90  >90 mL/min   Comment: (NOTE)     The eGFR has been calculated using the CKD EPI equation.     This calculation has not been validated in all clinical situations.     eGFR's persistently <90 mL/min signify possible Chronic Kidney     Disease.  CBC     Status: Abnormal   Collection Time    04/15/13  4:25 AM       Result Value Range   WBC 6.1  4.0 - 10.5 K/uL   RBC 3.86 (*) 4.22 - 5.81 MIL/uL   Hemoglobin 7.6 (*) 13.0 - 17.0 g/dL   HCT 24.8 (*) 39.0 - 52.0 %   MCV 64.2 (*) 78.0 - 100.0 fL   MCH 19.7 (*) 26.0 - 34.0 pg   MCHC 30.6  30.0 - 36.0 g/dL   RDW 20.8 (*) 11.5 - 15.5 %  Platelets 640 (*) 150 - 400 K/uL    Imaging / Studies: Ct Abdomen Pelvis W Contrast  04/13/2013   CLINICAL DATA:  Crohn's and celiac disease. Abdominal distention, nausea, pain.  EXAM: CT ABDOMEN AND PELVIS WITH CONTRAST  TECHNIQUE: Multidetector CT imaging of the abdomen and pelvis was performed using the standard protocol following bolus administration of intravenous contrast.  CONTRAST:  40m OMNIPAQUE IOHEXOL 300 MG/ML SOLN, 1037mOMNIPAQUE IOHEXOL 300 MG/ML SOLN  COMPARISON:  10/05/2012  FINDINGS: Visualized lung bases clear. Increase in mild to moderate abdominal ascites. Unremarkable liver, nondistended gallbladder, spleen, adrenal glands, and kidneys, pancreas, aorta, portal vein. Stomach is nondilated. There is incomplete progression of the oral contrast material beyond the proximal small bowel which is decompressed.  There is a loop of a markedly dilated distal small bowel which protrudes into the pelvis, with circumferential wall thickening and mucosal enhancement. This is probably a loop of distal jejunum. There is a 2nd distended loop of small bowel the right mid abdomen with small bowel feces sign. Both are associated with a poorly marginated a right lower quadrant enhancing soft tissue region measuring approximately 5.7 cm transverse diameter, and near the region of the terminal ileum which is poorly defined. There are adjacent enlarged mesenteric lymph nodes. The colon is nondistended.  The urinary bladder is physiologically distended. No definite abscess. No free air.  IMPRESSION: 1. High-grade mid/distal small bowel obstruction, possibly at 2 separate levels, associated with a right lower quadrant enhancing inflammatory  process, more conspicuous than on prior exam. Suspect progressive active Crohn's disease. 2. No evidence of abscess or perforation. 3. Increase in abdominal ascites.   Electronically Signed   By: DaArne Cleveland.D.   On: 04/13/2013 19:24   Dg Abd Acute W/chest  04/13/2013   CLINICAL DATA:  Abdominal pain. History of Crohn's disease and celiac disease.  EXAM: ACUTE ABDOMEN SERIES (ABDOMEN 2 VIEW & CHEST 1 VIEW)  COMPARISON:  10/05/2012, CT of the abdomen pelvis.  FINDINGS: There is a distended loop small bowel in the left mid abdomen. No other bowel dilation is seen. There are air-fluid levels on the erect view. No free air.  The soft tissues are unremarkable.  Normal bony structures.  Included frontal radiograph of the chest shows a normal heart, mediastinum and hila and clear lungs.  IMPRESSION: 1. Single loop of distended small bowel in the left mid abdomen. No other bowel dilation. There are air-fluid levels on the erect view suggesting a diffuse adynamic ileus. These findings may reflect active inflammatory bowel disease. Recommend followup CT the abdomen pelvis with contrast. 2. No free air. 3. Normal chest radiograph.   Electronically Signed   By: DaLajean Manes.D.   On: 04/13/2013 16:33    Medications / Allergies: per chart  Antibiotics: Anti-infectives   None       Note: This dictation was prepared with Dragon/digital dictation along with SmApple ComputerAny transcriptional errors that result from this process are unintentional.

## 2013-04-15 NOTE — Progress Notes (Addendum)
[facility] PROGRESS NOTE  Shane Klein RCV:[id number] DOB: Sep 03, 1990 DOA: 04/13/2013 PCP: No PCP Per Patient  Assessment/Plan  Partial SBO due to crohn's disease. Passed flatus and had small BM this AM.  XR improved. -  Appreciate general surgery recommendations -  NG clamped and plan to DC this afternoon -  CLD  -  Continue IV medications today and if tolerating CLD, then may transition to oral pills  Crohn's disease -  Started solumedrol 26m IV TID on 12/20, day 2  Hx of noncompliance.  Patient states his previous barriers were lack of insurance and being in jail.  He now has medical insurance and he just got out of jail.  He states he is ready to have a regular GI MD.  Iron deficiency anemia, likely GI losses despite neg hemoccult.   -  Transfused 1 unit PRBC 12/20 -  Feraheme infusion 12/21, may repeat in 3 days -  Will need oral iron as outpatient -  vitamin B12 wnl -  Folate 6.6 wnl  Celiac disease reported, however, after reviewing records, GI feels that patient does NOT have celiac disease.  He had a false positive anti-gliadin Ab test previously -  Will remove celiac disease from problem list and history  -  Will leave diet restriction for now and after SBO has resolved, patient can be challenged with wheat products by GI as outpatient  Diet:  CLD Access:  PIV IVF:  Continue dextrose fluids  Proph:  lovenox  Code Status: full Family Communication: patient alone Disposition Plan: pending improvement in SBO/flare   Consultants:  General surgery  GI  Procedures:  NG placement  CT abd/pelvis  Antibiotics:  None   HPI/Subjective:  States that his abdominal cramping resolved.  Denies nausea.  Had flatus and small BM.    Objective: Filed Vitals:   04/14/13 2058 04/15/13 0100 04/15/13 0526 04/15/13 1342  BP: 111/57 109/58 102/57 99/52  Pulse: 67 64 68 74  Temp: 97.9 F (36.6 C) 97.9 F (36.6 C) 97.9 F (36.6 C) 98.1 F (36.7 C)   TempSrc: Oral Oral Oral Oral  Resp: 16 16 18 18   Height:      Weight:      SpO2: 99% 97% 95% 100%    Intake/Output Summary (Last 24 hours) at 04/15/13 1406 Last data filed at 04/15/13 0900  Gross per 24 hour  Intake 2071.67 ml  Output      0 ml  Net 2071.67 ml   Filed Weights   04/14/13 0615  Weight: 63 kg (138 lb 14.2 oz)    Exam:   General:  Thin AAM, No acute distress  HEENT:  NCAT, MMM  Cardiovascular:  RRR, nl S1, S2 no mrg, 2+ pulses, warm extremities  Respiratory:  CTAB, no increased WOB  Abdomen:   NABS, soft, nondistended, nontender  MSK:   Normal tone and bulk, no LEE  Neuro:  Grossly intact  Data Reviewed: Basic Metabolic Panel:  Recent Labs Lab 04/13/13 1529 04/14/13 0745 04/15/13 0425  NA 144 140 136  K 4.0 4.0 4.4  CL 112 108 106  CO2 26 26 24   GLUCOSE 77 81 118*  BUN 23 16 13   CREATININE 1.15 0.77 0.72  CALCIUM 8.5 8.6 8.9  MG  --  1.8  --   PHOS  --  3.5  --    Liver Function Tests:  Recent Labs Lab 04/13/13 1529 04/14/13 0745  AST 10 9  ALT 6 7  ALKPHOS  53 52  BILITOT <0.1* 0.3  PROT 5.8* 5.5*  ALBUMIN 2.1* 2.0*    Recent Labs Lab 04/13/13 1529  LIPASE 55   No results found for this basename: AMMONIA,  in the last 168 hours CBC:  Recent Labs Lab 04/13/13 1529 04/14/13 0418 04/14/13 1115 04/15/13 0425  WBC 6.5 9.1 8.5 6.1  NEUTROABS 3.7  --   --   --   HGB 6.6* 6.9* 7.4* 7.6*  HCT 21.6* 23.1* 24.3* 24.8*  MCV 63.0* 62.9* 65.3* 64.2*  PLT 734* 706* 600* 640*   Cardiac Enzymes: No results found for this basename: CKTOTAL, CKMB, CKMBINDEX, TROPONINI,  in the last 168 hours BNP (last 3 results) No results found for this basename: PROBNP,  in the last 8760 hours CBG: No results found for this basename: GLUCAP,  in the last 168 hours  No results found for this or any previous visit (from the past 240 hour(s)).   Studies: Ct Abdomen Pelvis W Contrast  04/13/2013   CLINICAL DATA:  Crohn's and celiac  disease. Abdominal distention, nausea, pain.  EXAM: CT ABDOMEN AND PELVIS WITH CONTRAST  TECHNIQUE: Multidetector CT imaging of the abdomen and pelvis was performed using the standard protocol following bolus administration of intravenous contrast.  CONTRAST:  42m OMNIPAQUE IOHEXOL 300 MG/ML SOLN, 1075mOMNIPAQUE IOHEXOL 300 MG/ML SOLN  COMPARISON:  10/05/2012  FINDINGS: Visualized lung bases clear. Increase in mild to moderate abdominal ascites. Unremarkable liver, nondistended gallbladder, spleen, adrenal glands, and kidneys, pancreas, aorta, portal vein. Stomach is nondilated. There is incomplete progression of the oral contrast material beyond the proximal small bowel which is decompressed.  There is a loop of a markedly dilated distal small bowel which protrudes into the pelvis, with circumferential wall thickening and mucosal enhancement. This is probably a loop of distal jejunum. There is a 2nd distended loop of small bowel the right mid abdomen with small bowel feces sign. Both are associated with a poorly marginated a right lower quadrant enhancing soft tissue region measuring approximately 5.7 cm transverse diameter, and near the region of the terminal ileum which is poorly defined. There are adjacent enlarged mesenteric lymph nodes. The colon is nondistended.  The urinary bladder is physiologically distended. No definite abscess. No free air.  IMPRESSION: 1. High-grade mid/distal small bowel obstruction, possibly at 2 separate levels, associated with a right lower quadrant enhancing inflammatory process, more conspicuous than on prior exam. Suspect progressive active Crohn's disease. 2. No evidence of abscess or perforation. 3. Increase in abdominal ascites.   Electronically Signed   By: DaArne Cleveland.D.   On: 04/13/2013 19:24   Dg Abd 2 Views  04/15/2013   CLINICAL DATA:  Crohn's disease, follow-up small bowel obstruction  EXAM: ABDOMEN - 2 VIEW  COMPARISON:  CT abdomen pelvis dated 04/13/2013   FINDINGS: Mildly prominent but nondilated loops of small bowel in the left upper abdomen.  Residual contrast throughout the colon, which is not decompressed.  No radiographic findings to suggest small bowel obstruction.  Enteric tube is incompletely visualized in the left upper abdomen.  Visualized osseous structures are within normal limits.  IMPRESSION: No radiographic findings to suggest small bowel obstruction.  Residual contrast throughout the colon.   Electronically Signed   By: SrJulian Hy.D.   On: 04/15/2013 10:58   Dg Abd Acute W/chest  04/13/2013   CLINICAL DATA:  Abdominal pain. History of Crohn's disease and celiac disease.  EXAM: ACUTE ABDOMEN SERIES (ABDOMEN 2 VIEW & CHEST 1  VIEW)  COMPARISON:  10/05/2012, CT of the abdomen pelvis.  FINDINGS: There is a distended loop small bowel in the left mid abdomen. No other bowel dilation is seen. There are air-fluid levels on the erect view. No free air.  The soft tissues are unremarkable.  Normal bony structures.  Included frontal radiograph of the chest shows a normal heart, mediastinum and hila and clear lungs.  IMPRESSION: 1. Single loop of distended small bowel in the left mid abdomen. No other bowel dilation. There are air-fluid levels on the erect view suggesting a diffuse adynamic ileus. These findings may reflect active inflammatory bowel disease. Recommend followup CT the abdomen pelvis with contrast. 2. No free air. 3. Normal chest radiograph.   Electronically Signed   By: Lajean Manes M.D.   On: 04/13/2013 16:33    Scheduled Meds: . enoxaparin (LOVENOX) injection  40 mg Subcutaneous Q24H  . lip balm  1 application Topical BID  . methylPREDNISolone (SOLU-MEDROL) injection  40 mg Intravenous TID  . pantoprazole (PROTONIX) IV  40 mg Intravenous QHS  . sodium chloride  3 mL Intravenous Q12H   Continuous Infusions: . dextrose 5 % and 0.45 % NaCl with KCl 10 mEq/L 100 mL/hr at 04/14/13 1843    Active Problems:   Crohn's disease  with intestinal obstruction   Celiac disease   Anemia   GERD (gastroesophageal reflux disease)   Small bowel obstruction    Time spent: 30 min    Aaliayah Miao, [facility] Pager [phone]. If 7PM-7AM, please contact night-coverage at [email or web address], password [facility] 04/15/2013, 2:06 PM  LOS: 2 days

## 2013-04-15 NOTE — Progress Notes (Signed)
Subjective: Tolerating sips of clears without vomiting. NGT has been removed. Is passing flatus and stool.  Objective: Vital signs in last 24 hours: Temp:  [97.9 F (36.6 C)-98.1 F (36.7 C)] 98.1 F (36.7 C) (12/21 1342) Pulse Rate:  [64-74] 74 (12/21 1342) Resp:  [16-18] 18 (12/21 1342) BP: (99-111)/(52-58) 99/52 mmHg (12/21 1342) SpO2:  [95 %-100 %] 100 % (12/21 1342) Weight change:  Last BM Date: 04/15/13  PE: GEN:  NAD, NGT has been removed. ABD:  Soft  Lab Results: CBC    Component Value Date/Time   WBC 6.1 04/15/2013 0425   RBC 3.86* 04/15/2013 0425   RBC 3.82* 04/13/2013 1716   HGB 7.6* 04/15/2013 0425   HCT 24.8* 04/15/2013 0425   PLT 640* 04/15/2013 0425   MCV 64.2* 04/15/2013 0425   MCH 19.7* 04/15/2013 0425   MCHC 30.6 04/15/2013 0425   RDW 20.8* 04/15/2013 0425   LYMPHSABS 1.4 04/13/2013 1529   MONOABS 1.0 04/13/2013 1529   EOSABS 0.3 04/13/2013 1529   BASOSABS 0.1 04/13/2013 1529   CMP     Component Value Date/Time   NA 136 04/15/2013 0425   K 4.4 04/15/2013 0425   CL 106 04/15/2013 0425   CO2 24 04/15/2013 0425   GLUCOSE 118* 04/15/2013 0425   BUN 13 04/15/2013 0425   CREATININE 0.72 04/15/2013 0425   CALCIUM 8.9 04/15/2013 0425   PROT 5.5* 04/14/2013 0745   ALBUMIN 2.0* 04/14/2013 0745   AST 9 04/14/2013 0745   ALT 7 04/14/2013 0745   ALKPHOS 52 04/14/2013 0745   BILITOT 0.3 04/14/2013 0745   GFRNONAA >90 04/15/2013 0425   GFRAA >90 04/15/2013 0425   Abd xray:  No evidence of residual bowel obstruction  Assessment:  1.  Crohn's flare with partial small bowel obstruction, improving with medical management.  Plan:  1.  IV steroids for another 24 hours, then transition to prednisone tomorrow (assuming he tolerates advancing his diet). 2.  Clear liquids, advance diet slowly as tolerated. 3.  Out-of-bed-to-chair, ambulate halls as tolerated. 4.  Hopefully home in a couple days; will need outpatient GI follow-up, for consideration of  biologic therapy (certolizumab versus infliximab versus adalimumab) and pre-biologic screening (PPD, CXR, etc). 5.  [facility] to follow.   Freddy Jaksch 04/15/2013, 2:47 PM

## 2013-04-16 LAB — BASIC METABOLIC PANEL
BUN: 9 mg/dL (ref 6–23)
Calcium: 8.4 mg/dL (ref 8.4–10.5)
GFR calc Af Amer: >90 mL/min (ref >90)
GFR calc non Af Amer: >90 mL/min (ref >90)
Glucose, Bld: 107 mg/dL — ABNORMAL HIGH (ref 70–99)
Potassium: 4.1 mEq/L (ref 3.5–5.1)
Sodium: 135 mEq/L (ref 135–145)

## 2013-04-16 LAB — CBC
HCT: 24.1 % — ABNORMAL LOW (ref 39.0–52.0)
Hemoglobin: 7.5 g/dL — ABNORMAL LOW (ref 13.0–17.0)
MCH: 20.2 pg — ABNORMAL LOW (ref 26.0–34.0)
MCHC: 31.1 g/dL (ref 30.0–36.0)
Platelets: 656 10*3/uL — ABNORMAL HIGH (ref 150–400)
RBC: 3.71 MIL/uL — ABNORMAL LOW (ref 4.22–5.81)

## 2013-04-16 MED ORDER — FERROUS SULFATE 325 (65 FE) MG PO TABS
325.0000 mg | ORAL_TABLET | Freq: Three times a day (TID) | ORAL | Status: DC
  Administered 2013-04-16 – 2013-04-17 (×4): 325 mg via ORAL
  Filled 2013-04-16 (×6): qty 1

## 2013-04-16 MED ORDER — PREDNISONE 50 MG PO TABS
60.0000 mg | ORAL_TABLET | Freq: Every day | ORAL | Status: DC
  Administered 2013-04-17: 60 mg via ORAL
  Filled 2013-04-16 (×2): qty 1

## 2013-04-16 MED ORDER — OXYCODONE-ACETAMINOPHEN 5-325 MG PO TABS
1.0000 | ORAL_TABLET | Freq: Once | ORAL | Status: AC
  Administered 2013-04-16: 1 via ORAL
  Filled 2013-04-16: qty 1

## 2013-04-16 MED ORDER — DICYCLOMINE HCL 20 MG PO TABS
20.0000 mg | ORAL_TABLET | Freq: Once | ORAL | Status: AC
  Administered 2013-04-16: 20 mg via ORAL
  Filled 2013-04-16: qty 1

## 2013-04-16 NOTE — Progress Notes (Signed)
Patient ID: Shane Klein, male   DOB: 10-Jun-1990, 22 y.o.   MRN: [id number]    Subjective: Pt feels well this morning.  Tolerating full liquids, no nausea and + BM  Objective: Vital signs in last 24 hours: Temp:  [98.1 F (36.7 C)] 98.1 F (36.7 C) 04/30/23 2210) Pulse Rate:  [69-74] 69 April 30, 2023 2210) Resp:  [18-20] 20 04/30/23 2210) BP: (99-103)/(52-67) 103/67 mmHg 04-30-2023 2210) SpO2:  [98 %-100 %] 98 % 04-30-2023 2210) Last BM Date: 04/29/2013  Intake/Output from previous day: 04-30-23 0701 - 12/22 0700 In: 4480 [P.O.:2080; I.V.:2400] Out: -  Intake/Output this shift:    PE: Abd: soft, minimal lower abdominal tenderness, +BS, ND  Lab Results:   Recent Labs  April 29, 2013 0425 04/16/13 0418  WBC 6.1 7.3  HGB 7.6* 7.5*  HCT 24.8* 24.1*  PLT 640* 656*   BMET  Recent Labs  2013-04-29 0425 04/16/13 0418  NA 136 135  K 4.4 4.1  CL 106 102  CO2 24 24  GLUCOSE 118* 107*  BUN 13 9  CREATININE 0.72 0.73  CALCIUM 8.9 8.4   PT/INR No results found for this basename: LABPROT, INR,  in the last 72 hours CMP     Component Value Date/Time   NA 135 04/16/2013 0418   K 4.1 04/16/2013 0418   CL 102 04/16/2013 0418   CO2 24 04/16/2013 0418   GLUCOSE 107* 04/16/2013 0418   BUN 9 04/16/2013 0418   CREATININE 0.73 04/16/2013 0418   CALCIUM 8.4 04/16/2013 0418   PROT 5.5* 04/14/2013 0745   ALBUMIN 2.0* 04/14/2013 0745   AST 9 04/14/2013 0745   ALT 7 04/14/2013 0745   ALKPHOS 52 04/14/2013 0745   BILITOT 0.3 04/14/2013 0745   GFRNONAA >90 04/16/2013 0418   GFRAA >90 04/16/2013 0418   Lipase     Component Value Date/Time   LIPASE 55 04/13/2013 1529       Studies/Results: Dg Abd 2 Views  04-29-2013   CLINICAL DATA:  Crohn's disease, follow-up small bowel obstruction  EXAM: ABDOMEN - 2 VIEW  COMPARISON:  CT abdomen pelvis dated 04/13/2013  FINDINGS: Mildly prominent but nondilated loops of small bowel in the left upper abdomen.  Residual contrast throughout the colon, which is  not decompressed.  No radiographic findings to suggest small bowel obstruction.  Enteric tube is incompletely visualized in the left upper abdomen.  Visualized osseous structures are within normal limits.  IMPRESSION: No radiographic findings to suggest small bowel obstruction.  Residual contrast throughout the colon.   Electronically Signed   By: Julian Hy M.D.   On: 04/29/2013 10:58    Anti-infectives: Anti-infectives   None       Assessment/Plan  1. SBO secondary to Crohn's disease, improving  Plan: 1. Will advance to a low fiber diet today.  Defer further treatment of crohn's disease to GI and medicine.  No plans for surgical intervention at this time.  We will sign off.  Please call us back as needed.    LOS: 3 days    OSBORNE,KELLY E 04/16/2013, 9:46 AM Pager: [phone]  Agree with above. His main issue seems to be consistent follow up.  Alphonsa Overall, MD, [facility] Pager: [phone] Office phone:  [phone]

## 2013-04-16 NOTE — ED Provider Notes (Signed)
Medical screening examination/treatment/procedure(s) were conducted as a shared visit with non-physician practitioner(s) and myself.  I personally evaluated the patient during the encounter.  EKG Interpretation    Date/Time:    Ventricular Rate:    PR Interval:    QRS Duration:   QT Interval:    QTC Calculation:   R Axis:     Text Interpretation:              Likely Crohn's flare with associated small bowel obstruction.  Gen. surgery contacted.  Hospitalist admission.  NG tube.  Hoy Morn, MD 04/16/13 2132

## 2013-04-16 NOTE — Progress Notes (Addendum)
[facility] PROGRESS NOTE  Shane Klein JEH:[id number] DOB: 30-Jul-1990 DOA: 04/13/2013 PCP: No PCP Per Patient  Assessment/Plan  Partial SBO due to crohn's disease. Passed flatus and had small BM this AM.  XR improved. -  Advancing diet today -  Continue IV medications today and if tolerating CLD, then may transition to oral pills -  Appreciate surgery recommendations  Crohn's disease -  Started solumedrol 70m IV TID on 12/20 and has received total of 5 doses -  Will transition to oral prednisone tomorrow AM:  Ordered 666m but will defer dosing to GI -  Appreciate GI recommendations  Hx of noncompliance.  Patient states his previous barriers were lack of insurance and being in jail.  He now has medical insurance and he just got out of jail.  He states he is ready to have a regular GI MD.  Iron deficiency anemia, likely GI losses despite neg hemoccult.   -  Transfused 1 unit PRBC 12/20 -  Feraheme infusion 12/21, may repeat in 3 days -  Will need oral iron as outpatient, first dose tomorrow AM -  vitamin B12 wnl -  Folate 6.6 wnl  Celiac disease reported, however, after reviewing records, GI feels that patient does NOT have celiac disease.  He had a false positive anti-gliadin Ab test previously -  Celiac disease removed from history   Diet:  CLD Access:  PIV IVF:  off Proph:  lovenox  Code Status: full Family Communication: patient alone Disposition Plan: pending improvement in SBO/flare   Consultants:  General surgery  GI  Procedures:  NG placement  CT abd/pelvis  Antibiotics:  None   HPI/Subjective:  States that his abdominal cramping resolved.  Denies nausea.  Had flatus and small BM.  Drank well yesterday   Objective: Filed Vitals:   04/15/13 0100 04/15/13 0526 04/15/13 1342 04/15/13 2210  BP: 109/58 102/57 99/52 103/67  Pulse: 64 68 74 69  Temp: 97.9 F (36.6 C) 97.9 F (36.6 C) 98.1 F (36.7 C) 98.1 F (36.7 C)  TempSrc: Oral Oral  Oral Oral  Resp: 16 18 18 20   Height:      Weight:      SpO2: 97% 95% 100% 98%    Intake/Output Summary (Last 24 hours) at 04/16/13 1029 Last data filed at 04/16/13 0600  Gross per 24 hour  Intake   4480 ml  Output      0 ml  Net   4480 ml   Filed Weights   04/14/13 0615  Weight: 63 kg (138 lb 14.2 oz)    Exam:   General:  Thin AAM, No acute distress, well-appearing  HEENT:  NCAT, MMM  Cardiovascular:  RRR, nl S1, S2 no mrg, 2+ pulses, warm extremities  Respiratory:  CTAB, no increased WOB  Abdomen:   NABS, soft, nondistended, nontender  MSK:   Normal tone and bulk, no LEE  Neuro:  Grossly intact  Data Reviewed: Basic Metabolic Panel:  Recent Labs Lab 04/13/13 1529 04/14/13 0745 04/15/13 0425 04/16/13 0418  NA 144 140 136 135  K 4.0 4.0 4.4 4.1  CL 112 108 106 102  CO2 26 26 24 24   GLUCOSE 77 81 118* 107*  BUN 23 16 13 9   CREATININE 1.15 0.77 0.72 0.73  CALCIUM 8.5 8.6 8.9 8.4  MG  --  1.8  --   --   PHOS  --  3.5  --   --    Liver Function Tests:  Recent Labs  Lab 04/13/13 1529 04/14/13 0745  AST 10 9  ALT 6 7  ALKPHOS 53 52  BILITOT <0.1* 0.3  PROT 5.8* 5.5*  ALBUMIN 2.1* 2.0*    Recent Labs Lab 04/13/13 1529  LIPASE 55   No results found for this basename: AMMONIA,  in the last 168 hours CBC:  Recent Labs Lab 04/13/13 1529 04/14/13 0418 04/14/13 1115 04/15/13 0425 04/16/13 0418  WBC 6.5 9.1 8.5 6.1 7.3  NEUTROABS 3.7  --   --   --   --   HGB 6.6* 6.9* 7.4* 7.6* 7.5*  HCT 21.6* 23.1* 24.3* 24.8* 24.1*  MCV 63.0* 62.9* 65.3* 64.2* 65.0*  PLT 734* 706* 600* 640* 656*   Cardiac Enzymes: No results found for this basename: CKTOTAL, CKMB, CKMBINDEX, TROPONINI,  in the last 168 hours BNP (last 3 results) No results found for this basename: PROBNP,  in the last 8760 hours CBG: No results found for this basename: GLUCAP,  in the last 168 hours  No results found for this or any previous visit (from the past 240 hour(s)).    Studies: Dg Abd 2 Views  04/15/2013   CLINICAL DATA:  Crohn's disease, follow-up small bowel obstruction  EXAM: ABDOMEN - 2 VIEW  COMPARISON:  CT abdomen pelvis dated 04/13/2013  FINDINGS: Mildly prominent but nondilated loops of small bowel in the left upper abdomen.  Residual contrast throughout the colon, which is not decompressed.  No radiographic findings to suggest small bowel obstruction.  Enteric tube is incompletely visualized in the left upper abdomen.  Visualized osseous structures are within normal limits.  IMPRESSION: No radiographic findings to suggest small bowel obstruction.  Residual contrast throughout the colon.   Electronically Signed   By: Julian Hy M.D.   On: 04/15/2013 10:58    Scheduled Meds: . enoxaparin (LOVENOX) injection  40 mg Subcutaneous Q24H  . feeding supplement (RESOURCE BREEZE)  1 Container Oral TID BM  . lip balm  1 application Topical BID  . pantoprazole (PROTONIX) IV  40 mg Intravenous QHS  . sodium chloride  3 mL Intravenous Q12H   Continuous Infusions: . dextrose 5 % and 0.45 % NaCl with KCl 10 mEq/L 10 mL/hr at 04/16/13 0702    Active Problems:   Crohn's disease with intestinal obstruction   Anemia   GERD (gastroesophageal reflux disease)   Small bowel obstruction   Protein-calorie malnutrition, severe    Time spent: 30 min    Marciana Uplinger, [facility] Pager [phone]. If 7PM-7AM, please contact night-coverage at [email or web address], password [facility] 04/16/2013, 10:29 AM  LOS: 3 days

## 2013-04-16 NOTE — Progress Notes (Signed)
Patient at beginning of shift was complaining of no pain or nausea. Around 2230, patient started complaining of bad abdominal cramping 7 out of 10. Maalox was given to see if that would help since patient said he felt bloated. This did not work, so NP was notified of the change and new orders were given for a one time dose of bentyl and percocet to see if that would help. Will continue to monitor patient.

## 2013-04-16 NOTE — Progress Notes (Signed)
Subjective: Tolerating diet. Passing stool and flatus.  Objective: Vital signs in last 24 hours: Temp:  [98.1 F (36.7 C)] 98.1 F (36.7 C) (12/21 2210) Pulse Rate:  [69-74] 69 (12/21 2210) Resp:  [18-20] 20 (12/21 2210) BP: (99-103)/(52-67) 103/67 mmHg (12/21 2210) SpO2:  [98 %-100 %] 98 % (12/21 2210) Weight change:  Last BM Date: 04/15/13  PE: GEN:  NAD  Lab Results: CBC    Component Value Date/Time   WBC 7.3 04/16/2013 0418   RBC 3.71* 04/16/2013 0418   RBC 3.82* 04/13/2013 1716   HGB 7.5* 04/16/2013 0418   HCT 24.1* 04/16/2013 0418   PLT 656* 04/16/2013 0418   MCV 65.0* 04/16/2013 0418   MCH 20.2* 04/16/2013 0418   MCHC 31.1 04/16/2013 0418   RDW 20.9* 04/16/2013 0418   LYMPHSABS 1.4 04/13/2013 1529   MONOABS 1.0 04/13/2013 1529   EOSABS 0.3 04/13/2013 1529   BASOSABS 0.1 04/13/2013 1529   CMP     Component Value Date/Time   NA 135 04/16/2013 0418   K 4.1 04/16/2013 0418   CL 102 04/16/2013 0418   CO2 24 04/16/2013 0418   GLUCOSE 107* 04/16/2013 0418   BUN 9 04/16/2013 0418   CREATININE 0.73 04/16/2013 0418   CALCIUM 8.4 04/16/2013 0418   PROT 5.5* 04/14/2013 0745   ALBUMIN 2.0* 04/14/2013 0745   AST 9 04/14/2013 0745   ALT 7 04/14/2013 0745   ALKPHOS 52 04/14/2013 0745   BILITOT 0.3 04/14/2013 0745   GFRNONAA >90 04/16/2013 0418   GFRAA >90 04/16/2013 0418   Assessment:  1.  Crohn's disease, with partial small bowel obstruction, resolved.  Plan:  1.  Advance to low residue diet today. 2.  Prednisone 60 mg po qd, now and upon discharge. 3.  Outpatient GI follow-up in 2-3 weeks.   Freddy Jaksch 04/16/2013, 12:02 PM

## 2013-04-17 DIAGNOSIS — D509 Iron deficiency anemia, unspecified: Secondary | ICD-10-CM

## 2013-04-17 DIAGNOSIS — K900 Celiac disease: Secondary | ICD-10-CM

## 2013-04-17 LAB — BASIC METABOLIC PANEL
BUN: 9 mg/dL (ref 6–23)
CO2: 26 mEq/L (ref 19–32)
Calcium: 8.6 mg/dL (ref 8.4–10.5)
Creatinine, Ser: 0.78 mg/dL (ref 0.50–1.35)
GFR calc Af Amer: >90 mL/min (ref >90)
GFR calc non Af Amer: >90 mL/min (ref >90)
Glucose, Bld: 82 mg/dL (ref 70–99)
Sodium: 137 mEq/L (ref 135–145)

## 2013-04-17 LAB — CBC
Hemoglobin: 7.3 g/dL — ABNORMAL LOW (ref 13.0–17.0)
MCH: 20.1 pg — ABNORMAL LOW (ref 26.0–34.0)
MCHC: 30.5 g/dL (ref 30.0–36.0)
MCV: 65.8 fL — ABNORMAL LOW (ref 78.0–100.0)
Platelets: 609 10*3/uL — ABNORMAL HIGH (ref 150–400)
RDW: 21.3 % — ABNORMAL HIGH (ref 11.5–15.5)

## 2013-04-17 MED ORDER — BOOST / RESOURCE BREEZE PO LIQD: 1.0000 | Freq: Three times a day (TID) | ORAL | Status: DC

## 2013-04-17 MED ORDER — OMEPRAZOLE 20 MG PO CPDR: 20.0000 mg | DELAYED_RELEASE_CAPSULE | Freq: Every day | ORAL | Status: DC

## 2013-04-17 MED ORDER — FERROUS SULFATE 325 (65 FE) MG PO TABS: 325.0000 mg | ORAL_TABLET | Freq: Three times a day (TID) | ORAL | Status: DC

## 2013-04-17 MED ORDER — PREDNISONE 20 MG PO TABS: 60.0000 mg | ORAL_TABLET | Freq: Every day | ORAL | Status: DC

## 2013-04-17 NOTE — Discharge Summary (Signed)
Physician Discharge Summary  RONAL MAYBURY JQB:[id number] DOB: [birth year] DOA: 04/13/2013  PCP: No PCP Per Patient  Admit date: 04/13/2013 Discharge date: 04/17/2013  Recommendations for Outpatient Follow-up:  1. Followup with gastroenterology within 3-4 weeks of discharge for further management of Crohn's disease 2. Started on prednisone 60 mg daily as well as daily iron 3. Repeat CBC at follow up appointment to follow up iron-deficiency anemia   Discharge Diagnoses:  Active Problems:   Crohn's disease with intestinal obstruction   Anemia   GERD (gastroesophageal reflux disease)   Small bowel obstruction   Protein-calorie malnutrition, severe   Discharge Condition: Stable, improved  Diet recommendation: Low residue  Wt Readings from Last 3 Encounters:  04/14/13 63 kg (138 lb 14.2 oz)  11/20/12 72.394 kg (159 lb 9.6 oz)  11/15/12 77.111 kg (170 lb)    History of present illness:  Shane Klein is a 22 y.o. male  has a past medical history of Silicatosis; Celiac disease; Crohn's disease with intestinal obstruction (03/01/2012); Iron deficiency anemia; and GERD (gastroesophageal reflux disease).  Presented with 24 hour hx of abdominal pain denies nausea and vomiting. Yesterday he had some diarrhea associated with this. Patient has hx of Crohn's disease but has not been compliant with his regimen. HE has been on prednisone in the past but has not continued. HE self discontinued Entocort after prescription ran out and he was not able to follow up. Patient presented to ER today and was found to have SBO per CT. He has been seen by surgery who at this point recommend medicine admission with conservative management and surgery will follow as consultants. Patient is improving after NG placement.   Patient has hx of anemia due to iron deficiency. He has been incarcerated and states he has not been taking iron supplements. Denies recent blood in stool, no black stools. He has required  blood transfusions in the past.   Hospital Course:   Partial small bowel obstruction due to Crohn's disease: The patient was admitted with a distal small bowel obstruction and was seen by general surgery. He with started on IV fluids and allow bowel rest.   He had an NG tube placed to low intermittent suction.  He was started on IV steroids at the recommendation of gastroenterology, Solu-Medrol 40 mg IV 3 times a day. Soon after admission, he developed bowel sounds and started passing gas and bowel movements.  His NG tube was removed and he has been able to tolerate a low residue diet for the last day. Last night he had a small episode of abdominal pain which resolved with adjacent Bentyl. He has been transitioned to prednisone 60 mg daily which he will continue until he follows up with gastroenterology. Most likely he would benefit from a biologic agent for control of his Crohn's disease, and this will be discussed at his followup appointment with gastroenterology.  History of noncompliance. The patient states that his previous barriers were lack of insurance and being in jail. He now has Medicaid and he recently was released from jail. He states he is ready to have her regular gastroenterologist.  Iron deficiency anemia, likely secondary to gastrointestinal losses from Crohn's disease despite having a negative Hemoccult. He was transfused one unit of packed red blood cells on December 20 and then received iron infusion on December 21. He was started on iron supplements 3 times daily. His vitamin B12 level and folate levels were within normal limits.  Lab Results  Component Value Date  IRON <10* 04/13/2013   TIBC Not calculated due to Iron <10. 04/13/2013   FERRITIN 6* 04/13/2013    The patient reported a history of celiac disease, however after reviewing the records, gastroenterology feels that the patient does not have celiac disease. He did have a positive antigliadin antibody test but this was  likely a false positive as confirmatory tests (tissue transglutaminase) and endoscopy with biopsy were both inconsistent with celiac. Celiac disease was removed from the patient's history.    Consultants:  General surgery  GI Procedures:  NG placement  CT abd/pelvis Antibiotics:  None    Discharge Exam: Filed Vitals:   04/17/13 0545  BP: 125/73  Pulse: 71  Temp: 98.1 F (36.7 C)  Resp: 18   Filed Vitals:   04/15/13 2210 04/16/13 1405 04/16/13 2043 04/17/13 0545  BP: 103/67 121/53 113/58 125/73  Pulse: 69 75 74 71  Temp: 98.1 F (36.7 C) 98.5 F (36.9 C) 98.3 F (36.8 C) 98.1 F (36.7 C)  TempSrc: Oral Oral Oral Oral  Resp: 20 18 18 18   Height:      Weight:      SpO2: 98% 97% 100% 98%    General: Thin AAM, No acute distress, well-appearing  HEENT: NCAT, MMM  Cardiovascular: RRR, nl S1, S2 no mrg, 2+ pulses, warm extremities  Respiratory: CTAB, no increased WOB  Abdomen: NABS, soft, nondistended, nontender  MSK: Normal tone and bulk, no LEE  Neuro: Grossly intact   Discharge Instructions      Discharge Orders   Future Orders Complete By Expires   Call MD for:  difficulty breathing, headache or visual disturbances  As directed    Call MD for:  extreme fatigue  As directed    Call MD for:  hives  As directed    Call MD for:  persistant dizziness or light-headedness  As directed    Call MD for:  persistant nausea and vomiting  As directed    Call MD for:  severe uncontrolled pain  As directed    Call MD for:  temperature >100.4  As directed    Discharge instructions  As directed    Comments:     You were hospitalized with a small bowel obstruction due to a Crohn's flare.  Your bowels were allowed to rest and your Crohn's disease was treated with IV steroids.  You will need to continue prednisone (a steroid) 60m daily when you return home.  This medication decreases inflammation caused by Crohn's.  Please take calcium supplements while taking prednisone because  long-term use can cause osteoporosis.  Please continue your omeprazole and use ensure or resource breeze to help put on weight.  Eat a low residue diet for now.  According to Dr. OPaulita Fujita you do not have celiac disease - you had a false positive test -- so you may try to add some wheat products back into your diet.  Your endoscopies and further testing for celiac disease was all negative/fine.  Please take iron supplements three times a day as this may help prevent need for further transfusions.   Increase activity slowly  As directed        Medication List    STOP taking these medications       budesonide 3 MG 24 hr capsule  Commonly known as:  ENTOCORT EC      TAKE these medications       feeding supplement (RESOURCE BREEZE) Liqd  Take 1 Container by mouth 3 (three) times  daily between meals.     ferrous sulfate 325 (65 FE) MG tablet  Take 1 tablet (325 mg total) by mouth 3 (three) times daily with meals.     omeprazole 20 MG capsule  Commonly known as:  PRILOSEC  Take 1 capsule (20 mg total) by mouth daily.     predniSONE 20 MG tablet  Commonly known as:  DELTASONE  Take 3 tablets (60 mg total) by mouth daily with breakfast.       Follow-up Information   Follow up with Landry Dyke, MD. Schedule an appointment as soon as possible for a visit in 3 weeks.   Specialty:  Gastroenterology   Contact information:   [address] [place] [address] [phone]       Follow up with No PCP Per Patient In 2 weeks. (medicaid card states PCP information)    Specialty:  General Practice   Contact information:   [place] [address] [phone]        The results of significant diagnostics from this hospitalization (including imaging, microbiology, ancillary and laboratory) are listed below for reference.    Significant Diagnostic Studies: Ct Abdomen Pelvis W Contrast  04/13/2013   CLINICAL DATA:  Crohn's and celiac disease. Abdominal  distention, nausea, pain.  EXAM: CT ABDOMEN AND PELVIS WITH CONTRAST  TECHNIQUE: Multidetector CT imaging of the abdomen and pelvis was performed using the standard protocol following bolus administration of intravenous contrast.  CONTRAST:  80m OMNIPAQUE IOHEXOL 300 MG/ML SOLN, 1056mOMNIPAQUE IOHEXOL 300 MG/ML SOLN  COMPARISON:  10/05/2012  FINDINGS: Visualized lung bases clear. Increase in mild to moderate abdominal ascites. Unremarkable liver, nondistended gallbladder, spleen, adrenal glands, and kidneys, pancreas, aorta, portal vein. Stomach is nondilated. There is incomplete progression of the oral contrast material beyond the proximal small bowel which is decompressed.  There is a loop of a markedly dilated distal small bowel which protrudes into the pelvis, with circumferential wall thickening and mucosal enhancement. This is probably a loop of distal jejunum. There is a 2nd distended loop of small bowel the right mid abdomen with small bowel feces sign. Both are associated with a poorly marginated a right lower quadrant enhancing soft tissue region measuring approximately 5.7 cm transverse diameter, and near the region of the terminal ileum which is poorly defined. There are adjacent enlarged mesenteric lymph nodes. The colon is nondistended.  The urinary bladder is physiologically distended. No definite abscess. No free air.  IMPRESSION: 1. High-grade mid/distal small bowel obstruction, possibly at 2 separate levels, associated with a right lower quadrant enhancing inflammatory process, more conspicuous than on prior exam. Suspect progressive active Crohn's disease. 2. No evidence of abscess or perforation. 3. Increase in abdominal ascites.   Electronically Signed   By: DaArne Cleveland.D.   On: 04/13/2013 19:24   Dg Abd 2 Views  04/15/2013   CLINICAL DATA:  Crohn's disease, follow-up small bowel obstruction  EXAM: ABDOMEN - 2 VIEW  COMPARISON:  CT abdomen pelvis dated 04/13/2013  FINDINGS: Mildly  prominent but nondilated loops of small bowel in the left upper abdomen.  Residual contrast throughout the colon, which is not decompressed.  No radiographic findings to suggest small bowel obstruction.  Enteric tube is incompletely visualized in the left upper abdomen.  Visualized osseous structures are within normal limits.  IMPRESSION: No radiographic findings to suggest small bowel obstruction.  Residual contrast throughout the colon.   Electronically Signed   By: SrJulian Hy  M.D.   On: 04/15/2013 10:58   Dg Abd Acute W/chest  04/13/2013   CLINICAL DATA:  Abdominal pain. History of Crohn's disease and celiac disease.  EXAM: ACUTE ABDOMEN SERIES (ABDOMEN 2 VIEW & CHEST 1 VIEW)  COMPARISON:  10/05/2012, CT of the abdomen pelvis.  FINDINGS: There is a distended loop small bowel in the left mid abdomen. No other bowel dilation is seen. There are air-fluid levels on the erect view. No free air.  The soft tissues are unremarkable.  Normal bony structures.  Included frontal radiograph of the chest shows a normal heart, mediastinum and hila and clear lungs.  IMPRESSION: 1. Single loop of distended small bowel in the left mid abdomen. No other bowel dilation. There are air-fluid levels on the erect view suggesting a diffuse adynamic ileus. These findings may reflect active inflammatory bowel disease. Recommend followup CT the abdomen pelvis with contrast. 2. No free air. 3. Normal chest radiograph.   Electronically Signed   By: Lajean Manes M.D.   On: 04/13/2013 16:33    Microbiology: No results found for this or any previous visit (from the past 240 hour(s)).   Labs: Basic Metabolic Panel:  Recent Labs Lab 04/13/13 1529 04/14/13 0745 04/15/13 0425 04/16/13 0418 04/17/13 0400  NA 144 140 136 135 137  K 4.0 4.0 4.4 4.1 3.9  CL 112 108 106 102 103  CO2 26 26 24 24 26   GLUCOSE 77 81 118* 107* 82  BUN 23 16 13 9 9   CREATININE 1.15 0.77 0.72 0.73 0.78  CALCIUM 8.5 8.6 8.9 8.4 8.6  MG  --   1.8  --   --   --   PHOS  --  3.5  --   --   --    Liver Function Tests:  Recent Labs Lab 04/13/13 1529 04/14/13 0745  AST 10 9  ALT 6 7  ALKPHOS 53 52  BILITOT <0.1* 0.3  PROT 5.8* 5.5*  ALBUMIN 2.1* 2.0*    Recent Labs Lab 04/13/13 1529  LIPASE 55   No results found for this basename: AMMONIA,  in the last 168 hours CBC:  Recent Labs Lab 04/13/13 1529 04/14/13 0418 04/14/13 1115 04/15/13 0425 04/16/13 0418 04/17/13 0400  WBC 6.5 9.1 8.5 6.1 7.3 9.4  NEUTROABS 3.7  --   --   --   --   --   HGB 6.6* 6.9* 7.4* 7.6* 7.5* 7.3*  HCT 21.6* 23.1* 24.3* 24.8* 24.1* 23.9*  MCV 63.0* 62.9* 65.3* 64.2* 65.0* 65.8*  PLT 734* 706* 600* 640* 656* 609*   Cardiac Enzymes: No results found for this basename: CKTOTAL, CKMB, CKMBINDEX, TROPONINI,  in the last 168 hours BNP: BNP (last 3 results) No results found for this basename: PROBNP,  in the last 8760 hours CBG: No results found for this basename: GLUCAP,  in the last 168 hours  Time coordinating discharge: 45 minutes  Signed:  Iyana Topor  [facility] 04/17/2013, 10:55 AM

## 2013-04-17 NOTE — Progress Notes (Signed)
Subjective: Tolerating diet. Passing flatus and stool.  Objective: Vital signs in last 24 hours: Temp:  [98.1 F (36.7 C)-98.5 F (36.9 C)] 98.1 F (36.7 C) (12/23 0545) Pulse Rate:  [71-75] 71 (12/23 0545) Resp:  [18] 18 (12/23 0545) BP: (113-125)/(53-73) 125/73 mmHg (12/23 0545) SpO2:  [97 %-100 %] 98 % (12/23 0545) Weight change:  Last BM Date: 04/15/13  PE: GEN:  NAD  Lab Results: CBC    Component Value Date/Time   WBC 9.4 04/17/2013 0400   RBC 3.63* 04/17/2013 0400   RBC 3.82* 04/13/2013 1716   HGB 7.3* 04/17/2013 0400   HCT 23.9* 04/17/2013 0400   PLT 609* 04/17/2013 0400   MCV 65.8* 04/17/2013 0400   MCH 20.1* 04/17/2013 0400   MCHC 30.5 04/17/2013 0400   RDW 21.3* 04/17/2013 0400   LYMPHSABS 1.4 04/13/2013 1529   MONOABS 1.0 04/13/2013 1529   EOSABS 0.3 04/13/2013 1529   BASOSABS 0.1 04/13/2013 1529   CMP     Component Value Date/Time   NA 137 04/17/2013 0400   K 3.9 04/17/2013 0400   CL 103 04/17/2013 0400   CO2 26 04/17/2013 0400   GLUCOSE 82 04/17/2013 0400   BUN 9 04/17/2013 0400   CREATININE 0.78 04/17/2013 0400   CALCIUM 8.6 04/17/2013 0400   PROT 5.5* 04/14/2013 0745   ALBUMIN 2.0* 04/14/2013 0745   AST 9 04/14/2013 0745   ALT 7 04/14/2013 0745   ALKPHOS 52 04/14/2013 0745   BILITOT 0.3 04/14/2013 0745   GFRNONAA >90 04/17/2013 0400   GFRAA >90 04/17/2013 0400   Assessment:  1.  Crohn's disease with partial small bowel obstruction, improving.  Plan:  1.  Low residue diet until further notice. 2.  Prednisone 60 mg po qd, dispense 30 day supply with one refill. 3.  Will need outpatient follow-up with me in 3-4 weeks.  We can orchestrate getting patient started on biologic therapy. 4.  Will sign-off; please call with questions.  Thank you for the consult.   Freddy Jaksch 04/17/2013, 10:14 AM

## 2013-04-22 ENCOUNTER — Emergency Department (HOSPITAL_COMMUNITY): Payer: Medicaid Other

## 2013-04-22 ENCOUNTER — Inpatient Hospital Stay (HOSPITAL_COMMUNITY)
Admission: EM | Admit: 2013-04-22 | Discharge: 2013-04-24 | DRG: 377 | Disposition: A | Discharge status: Home / Self Care | Payer: Medicaid Other | Attending: Family Medicine | Admitting: Family Medicine

## 2013-04-22 ENCOUNTER — Encounter (HOSPITAL_COMMUNITY): Payer: Self-pay | Admitting: Emergency Medicine

## 2013-04-22 DIAGNOSIS — Z833 Family history of diabetes mellitus: Secondary | ICD-10-CM

## 2013-04-22 DIAGNOSIS — K922 Gastrointestinal hemorrhage, unspecified: Secondary | ICD-10-CM

## 2013-04-22 DIAGNOSIS — K5080 Crohn's disease of both small and large intestine without complications: Secondary | ICD-10-CM

## 2013-04-22 DIAGNOSIS — Z91199 Patient's noncompliance with other medical treatment and regimen due to unspecified reason: Secondary | ICD-10-CM

## 2013-04-22 DIAGNOSIS — K50912 Crohn's disease, unspecified, with intestinal obstruction: Secondary | ICD-10-CM

## 2013-04-22 DIAGNOSIS — K5000 Crohn's disease of small intestine without complications: Secondary | ICD-10-CM | POA: Diagnosis present

## 2013-04-22 DIAGNOSIS — K219 Gastro-esophageal reflux disease without esophagitis: Secondary | ICD-10-CM | POA: Diagnosis present

## 2013-04-22 DIAGNOSIS — D649 Anemia, unspecified: Secondary | ICD-10-CM

## 2013-04-22 DIAGNOSIS — K56609 Unspecified intestinal obstruction, unspecified as to partial versus complete obstruction: Secondary | ICD-10-CM

## 2013-04-22 DIAGNOSIS — Z9119 Patient's noncompliance with other medical treatment and regimen: Secondary | ICD-10-CM

## 2013-04-22 DIAGNOSIS — E43 Unspecified severe protein-calorie malnutrition: Secondary | ICD-10-CM | POA: Diagnosis present

## 2013-04-22 DIAGNOSIS — K2980 Duodenitis without bleeding: Secondary | ICD-10-CM

## 2013-04-22 DIAGNOSIS — R634 Abnormal weight loss: Secondary | ICD-10-CM

## 2013-04-22 DIAGNOSIS — D509 Iron deficiency anemia, unspecified: Secondary | ICD-10-CM | POA: Diagnosis present

## 2013-04-22 DIAGNOSIS — Z6825 Body mass index (BMI) 25.0-25.9, adult: Secondary | ICD-10-CM

## 2013-04-22 DIAGNOSIS — R109 Unspecified abdominal pain: Secondary | ICD-10-CM

## 2013-04-22 DIAGNOSIS — R609 Edema, unspecified: Secondary | ICD-10-CM

## 2013-04-22 DIAGNOSIS — Z87891 Personal history of nicotine dependence: Secondary | ICD-10-CM

## 2013-04-22 DIAGNOSIS — R600 Localized edema: Secondary | ICD-10-CM

## 2013-04-22 HISTORY — DX: Gastrointestinal hemorrhage, unspecified: K92.2

## 2013-04-22 LAB — BASIC METABOLIC PANEL
BUN: 30 mg/dL — ABNORMAL HIGH (ref 6–23)
Chloride: 106 mEq/L (ref 96–112)
GFR calc Af Amer: >90 mL/min (ref >90)
GFR calc non Af Amer: >90 mL/min (ref >90)
Potassium: 4 mEq/L (ref 3.5–5.1)
Sodium: 140 mEq/L (ref 135–145)

## 2013-04-22 LAB — PRO B NATRIURETIC PEPTIDE: Pro B Natriuretic peptide (BNP): 327.1 pg/mL — ABNORMAL HIGH (ref 0–125)

## 2013-04-22 LAB — CBC
HCT: 22.8 % — ABNORMAL LOW (ref 39.0–52.0)
MCHC: 30.3 g/dL (ref 30.0–36.0)
MCV: 72.2 fL — ABNORMAL LOW (ref 78.0–100.0)
Platelets: 525 10*3/uL — ABNORMAL HIGH (ref 150–400)
RDW: 28 % — ABNORMAL HIGH (ref 11.5–15.5)
WBC: 20.3 10*3/uL — ABNORMAL HIGH (ref 4.0–10.5)

## 2013-04-22 LAB — POCT I-STAT TROPONIN I

## 2013-04-22 MED ORDER — OXYCODONE-ACETAMINOPHEN 5-325 MG PO TABS
1.0000 | ORAL_TABLET | Freq: Once | ORAL | Status: AC
  Administered 2013-04-22: 1 via ORAL
  Filled 2013-04-22: qty 1

## 2013-04-22 MED ORDER — FUROSEMIDE 10 MG/ML IJ SOLN
20.0000 mg | Freq: Once | INTRAMUSCULAR | Status: AC
  Administered 2013-04-23: 20 mg via INTRAVENOUS

## 2013-04-22 NOTE — ED Notes (Signed)
Pt c/o bilateral leg swelling with pitting edema x 2 days with some SOB; pt denies hx of same

## 2013-04-22 NOTE — ED Notes (Signed)
Wickline MD at bedside  

## 2013-04-22 NOTE — H&P (Signed)
[facility] History and Physical  Shane Klein:[id number] DOB: [birth year]    PCP:  None.  Chief Complaint: complains of bilateral pedal edema, but admitted for slow upper GI bleed.  HPI: Shane Klein is an 22 y.o. male with hx of crohn's disease, severe iron deficiency anemia (Hb about 8g/dL), noncompliance, severe malnutrition with albumin of 2.0, GERD, duodenitis by EGD 2010 ( Dr Russella Dar), recent admission for SBO, saw surgery and GI, and resolved spontaneously, discharged on oral Prednisone, presents to the ER because of bilateral feet swelling.  It is not new for him, but it has gotten worse since his last hospitalization when he did receive blood.  He tolerated well.  He has no abdominal pain, nausea, vomiting, but notice melanotic stool.  He has not been taking iron, or peptobismal.  Evaluation in the ER included a Hb of 6.9g/dL (stable for many months, but last year his Hb was 12g/dL, BUN of 30 ( a rise), and stool guaic positive.  Hospitalist was asked to admit him for upper GI bleed. He does feel a little short of breath and weak with his anemia.  Rewiew of Systems:  Constitutional: Negative for malaise, fever and chills. No significant weight loss or weight gain Eyes: Negative for eye pain, redness and discharge, diplopia, visual changes, or flashes of light. ENMT: Negative for ear pain, hoarseness, nasal congestion, sinus pressure and sore throat. No headaches; tinnitus, drooling, or problem swallowing. Cardiovascular: Negative for chest pain, palpitations, diaphoresis, dyspnea and peripheral edema. ; No orthopnea, PND Respiratory: Negative for cough, hemoptysis, wheezing and stridor. No pleuritic chestpain. Gastrointestinal: Negative for nausea, vomiting, diarrhea, constipation, abdominal pain,  blood in stool, hematemesis, jaundice and rectal bleeding.    Genitourinary: Negative for frequency, dysuria, incontinence,flank pain and hematuria; Musculoskeletal: Negative  for back pain and neck pain. Negative  trauma.;  Skin: . Negative for pruritus, rash, abrasions, bruising and skin lesion.; ulcerations Neuro: Negative for headache, lightheadedness and neck stiffness. Negative for  altered level of consciousness , altered mental status, extremity weakness, burning feet, involuntary movement, seizure and syncope.  Psych: negative for anxiety, depression, insomnia, tearfulness, panic attacks, hallucinations, paranoia, suicidal or homicidal ideation    Past Medical History  Diagnosis Date  . 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. Crohn's disease with intestinal obstruction 03/01/2012    **Patient does not have celiac disease.  False-positive   . Iron deficiency anemia   . GERD (gastroesophageal reflux disease)     Past Surgical History  Procedure Laterality Date  . Esophagogastroduodenoscopy  Oct 2010    Dr. Russella Dar: duodenitis, normal villi, likely Crohn's. Elevated Gliadin but normal TTG, IgA and IgA  . Colonoscopy  Oct 2010    Dr. Russella Dar: evidence of Crohn's at cecum, ascending colon, unable to intubate the terminal ileum. CTE with distal and terminal ileitis    Medications:  HOME MEDS: Prior to Admission medications   Medication Sig Start Date End Date Taking? Authorizing Provider  feeding supplement, RESOURCE BREEZE, (RESOURCE BREEZE) LIQD Take 1 Container by mouth 3 (three) times daily between meals. 04/17/13  Yes Renae Fickle, MD  omeprazole (PRILOSEC) 20 MG capsule Take 1 capsule (20 mg total) by mouth daily. 04/17/13  Yes Renae Fickle, MD  predniSONE (DELTASONE) 20 MG tablet Take 3 tablets (60 mg total) by mouth daily with breakfast. 04/17/13  Yes Renae Fickle, MD  ferrous sulfate 325 (65 FE) MG tablet Take 1 tablet (325 mg total) by mouth 3 (three) times daily with meals. 04/17/13  Renae Fickle, MD     Allergies:  Allergies  Allergen Reactions  . Gluten Meal Other (See Comments)    Stomach pain  . Wheat Bran Other (See Comments)    Belly pain     Social History:   reports that he quit smoking about 2 months ago. His smoking use included Cigarettes. He smoked 0.00 packs per day. He has never used smokeless tobacco. He reports that he does not drink alcohol or use illicit drugs.  Family History: Family History  Problem Relation Age of Onset  . Diabetes Maternal Aunt   . Diabetes Maternal Grandmother   . Colon cancer Neg Hx   . Crohn's disease Maternal Aunt      Physical Exam: Filed Vitals:   04/22/13 2245 04/22/13 2300 04/22/13 2315 04/22/13 2330  BP: 108/46 112/48 119/53 115/57  Pulse: 79 82 79 82  Resp:      SpO2: 100% 100% 99% 99%   Blood pressure 115/57, pulse 82, resp. rate 17, SpO2 99.00%.  GEN:  Pleasant patient lying in the stretcher in no acute distress; cooperative with exam. PSYCH:  alert and oriented x4; does not appear anxious or depressed; affect is appropriate. HEENT: Mucous membranes pink and anicteric; PERRLA; EOM intact; no cervical lymphadenopathy nor thyromegaly or carotid bruit; no JVD; There were no stridor. Neck is very supple. Breasts:: Not examined CHEST WALL: No tenderness CHEST: Normal respiration, clear to auscultation bilaterally.  HEART: Regular rate and rhythm.  There are no murmur, rub, or gallops.   BACK: No kyphosis or scoliosis; no CVA tenderness ABDOMEN: soft and non-tender; no masses, no organomegaly, normal abdominal bowel sounds; no pannus; no intertriginous candida. There is no rebound and no distention. Rectal Exam: Not done EXTREMITIES: No bone or joint deformity; age-appropriate arthropathy of the hands and knees; no edema; no ulcerations.  There is no calf tenderness. Genitalia: not examined PULSES: 2+ and symmetric SKIN: Normal hydration no rash or ulceration CNS: Cranial nerves 2-12 grossly intact no focal lateralizing neurologic deficit.  Speech is fluent; uvula elevated with phonation, facial symmetry and tongue midline. DTR are normal bilaterally, cerebella exam is  intact, barbinski is negative and strengths are equaled bilaterally.  No sensory loss.   Labs on Admission:  Basic Metabolic Panel:  Recent Labs Lab 04/16/13 0418 04/17/13 0400 04/22/13 1903  NA 135 137 140  K 4.1 3.9 4.0  CL 102 103 106  CO2 24 26 23   GLUCOSE 107* 82 97  BUN 9 9 30*  CREATININE 0.73 0.78 1.03  CALCIUM 8.4 8.6 8.2*   Liver Function Tests: No results found for this basename: AST, ALT, ALKPHOS, BILITOT, PROT, ALBUMIN,  in the last 168 hours No results found for this basename: LIPASE, AMYLASE,  in the last 168 hours No results found for this basename: AMMONIA,  in the last 168 hours CBC:  Recent Labs Lab 04/16/13 0418 04/17/13 0400 04/22/13 1903  WBC 7.3 9.4 20.3*  HGB 7.5* 7.3* 6.9*  HCT 24.1* 23.9* 22.8*  MCV 65.0* 65.8* 72.2*  PLT 656* 609* 525*   Cardiac Enzymes: No results found for this basename: CKTOTAL, CKMB, CKMBINDEX, TROPONINI,  in the last 168 hours  CBG: No results found for this basename: GLUCAP,  in the last 168 hours   Radiological Exams on Admission: Dg Chest 2 View  04/22/2013   CLINICAL DATA:  Bilateral leg swelling  EXAM: CHEST  2 VIEW  COMPARISON:  04/13/2013  FINDINGS: The heart size and mediastinal contours are within  normal limits. Both lungs are clear. The visualized skeletal structures are unremarkable.  IMPRESSION: No active cardiopulmonary disease.   Electronically Signed   By: Marlan Palau M.D.   On: 04/22/2013 20:18   Assessment/Plan Present on Admission:  . GI bleed . Crohn's disease of ileum . Anemia . ANEMIA-IRON DEFICIENCY . Protein-calorie malnutrition, severe  PLAN:  He likely has a slow upper GI bleed, with steroid use and hx of duodenitis, with elevated BUN abruptly and melanotic stool.  He is hemodynamically stable, and his Hb, though low, was stable as well.  I will admit him to telemetry, transfuse 2 units of PRBC.  Please consult Dr Dulce Sellar tomorrow for further recommendation.  In the interim, I will give  IV Protonix bolus and drip.  Will hold his steroid for now, and give clear liquid.  His pedal edema is likely from how oncotic pressure with extravascular edema.  With blood, albumin IV can be avoided, and I will give IV lasix.  This will give him temporary relief of edema. He is stable, full code, and will be admitted to [facility] service.  Thank you for asking me to participate in his care.  Other plans as per orders.  Code Status: FULL Unk Lightning, MD. [facility] Pager [phone] 7pm to 7am.  04/22/2013, 11:48 PM

## 2013-04-22 NOTE — ED Provider Notes (Signed)
CSN: [id number]     Arrival date & time 04/22/13  1840 History   First MD Initiated Contact with Patient 04/22/13 2233     Chief Complaint  Patient presents with  . Leg Swelling   Patient is a 22 y.o. male presenting with weakness. The history is provided by the patient.  Weakness This is a new problem. Episode onset: today. The problem occurs constantly. The problem has not changed since onset.Pertinent negatives include no chest pain and no shortness of breath. The symptoms are aggravated by exertion. The symptoms are relieved by rest. He has tried rest for the symptoms. The treatment provided no relief.   Pt reports he has felt weak today and also noticed black stool (he is not taking his iron pills) He denies abd pain or vomiting No syncope.  No cp/sob.    He also reports LE swelling for 3 days.  He reports this is a new issue for him Past Medical History  Diagnosis Date  . 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. Crohn's disease with intestinal obstruction 03/01/2012    **Patient does not have celiac disease.  False-positive   . Iron deficiency anemia   . GERD (gastroesophageal reflux disease)    Past Surgical History  Procedure Laterality Date  . Esophagogastroduodenoscopy  Oct 2010    Dr. Russella Dar: duodenitis, normal villi, likely Crohn's. Elevated Gliadin but normal TTG, IgA and IgA  . Colonoscopy  Oct 2010    Dr. Russella Dar: evidence of Crohn's at cecum, ascending colon, unable to intubate the terminal ileum. CTE with distal and terminal ileitis   Family History  Problem Relation Age of Onset  . Diabetes Maternal Aunt   . Diabetes Maternal Grandmother   . Colon cancer Neg Hx   . Crohn's disease Maternal Aunt    History  Substance Use Topics  . Smoking status: Former Smoker    Types: Cigarettes    Quit date: 01/24/2013  . Smokeless tobacco: Never Used     Comment: 2-3 cigarettes per day  . Alcohol Use: No     Comment: rarely    Review of Systems  Constitutional: Positive for fatigue. Negative  for fever.  Respiratory: Negative for shortness of breath.   Cardiovascular: Negative for chest pain.  Gastrointestinal: Positive for blood in stool.  Neurological: Positive for weakness.  All other systems reviewed and are negative.    Allergies  Gluten meal and Wheat bran  Home Medications   Current Outpatient Rx  Name  Route  Sig  Dispense  Refill  . feeding supplement, RESOURCE BREEZE, (RESOURCE BREEZE) LIQD   Oral   Take 1 Container by mouth 3 (three) times daily between meals.   90 Container   0   . omeprazole (PRILOSEC) 20 MG capsule   Oral   Take 1 capsule (20 mg total) by mouth daily.   30 capsule   0   . predniSONE (DELTASONE) 20 MG tablet   Oral   Take 3 tablets (60 mg total) by mouth daily with breakfast.   90 tablet   0   . ferrous sulfate 325 (65 FE) MG tablet   Oral   Take 1 tablet (325 mg total) by mouth 3 (three) times daily with meals.   90 tablet   0    BP 130/71  Pulse 88  Resp 17  SpO2 99% Physical Exam CONSTITUTIONAL: Well developed/well nourished HEAD: Normocephalic/atraumatic EYES: EOMI/PERRL ENMT: Mucous membranes moist NECK: supple no meningeal signs SPINE:entire spine nontender CV: S1/S2 noted,  no murmurs/rubs/gallops noted LUNGS: Lungs are clear to auscultation bilaterally, no apparent distress ABDOMEN: soft, nontender, no rebound or guarding Rectal -stool dark.  No gross blood.  Hemoccult positive.  Chaperone present GU:no cva tenderness NEURO: Pt is awake/alert, moves all extremitiesx4 EXTREMITIES: pulses normal, full ROM, symmetric pitting edema to bilateral LE SKIN: warm, color normal PSYCH: no abnormalities of mood noted  ED Course  Procedures (including critical care time) Labs Review Labs Reviewed  CBC - Abnormal; Notable for the following:    WBC 20.3 (*)    RBC 3.16 (*)    Hemoglobin 6.9 (*)    HCT 22.8 (*)    MCV 72.2 (*)    MCH 21.8 (*)    RDW 28.0 (*)    Platelets 525 (*)    All other components within  normal limits  BASIC METABOLIC PANEL - Abnormal; Notable for the following:    BUN 30 (*)    Calcium 8.2 (*)    All other components within normal limits  PRO B NATRIURETIC PEPTIDE - Abnormal; Notable for the following:    Pro B Natriuretic peptide (BNP) 327.1 (*)    All other components within normal limits  OCCULT BLOOD, POC DEVICE - Abnormal; Notable for the following:    Fecal Occult Bld POSITIVE (*)    All other components within normal limits  POCT I-STAT TROPONIN I  TYPE AND SCREEN   Imaging Review Dg Chest 2 View  04/22/2013   CLINICAL DATA:  Bilateral leg swelling  EXAM: CHEST  2 VIEW  COMPARISON:  04/13/2013  FINDINGS: The heart size and mediastinal contours are within normal limits. Both lungs are clear. The visualized skeletal structures are unremarkable.  IMPRESSION: No active cardiopulmonary disease.   Electronically Signed   By: Marlan Palau M.D.   On: 04/22/2013 20:18    EKG Interpretation    Date/Time:  Sunday April 22 2013 18:53:04 EST Ventricular Rate:  110 PR Interval:  112 QRS Duration: 86 QT Interval:  306 QTC Calculation: 414 R Axis:   70 Text Interpretation:  Sinus tachycardia Otherwise normal ECG artifact noted Confirmed by Bebe Shaggy  MD, Leary Mcnulty [phone]) on 04/22/2013 11:22:41 PM            MDM   1. GI bleed   2. Anemia   3. Peripheral edema    Nursing notes including past medical history and social history reviewed and considered in documentation Labs/vital reviewed and considered xrays reviewed and considered Previous records reviewed and considered  11:23 PM Pt currently on steroids (h/o crohns) now reporting black stools, with worsened anemia/fatigue.  Suspect GI bleed.  Pt currently stable D/w hospitalist dr Conley Rolls will admit    Joya Gaskins, MD 04/22/13 2324

## 2013-04-23 DIAGNOSIS — K219 Gastro-esophageal reflux disease without esophagitis: Secondary | ICD-10-CM

## 2013-04-23 DIAGNOSIS — K5000 Crohn's disease of small intestine without complications: Secondary | ICD-10-CM

## 2013-04-23 DIAGNOSIS — E43 Unspecified severe protein-calorie malnutrition: Secondary | ICD-10-CM

## 2013-04-23 DIAGNOSIS — R109 Unspecified abdominal pain: Secondary | ICD-10-CM

## 2013-04-23 LAB — ABO/RH: ABO/RH(D): A POS

## 2013-04-23 LAB — HEMOGLOBIN AND HEMATOCRIT, BLOOD: Hemoglobin: 8.6 g/dL — ABNORMAL LOW (ref 13.0–17.0)

## 2013-04-23 LAB — PREPARE RBC (CROSSMATCH)

## 2013-04-23 MED ORDER — PANTOPRAZOLE SODIUM 40 MG IV SOLR: 40.0000 mg | Freq: Two times a day (BID) | INTRAVENOUS | Status: DC

## 2013-04-23 MED ORDER — ONDANSETRON HCL 4 MG PO TABS: 4.0000 mg | ORAL_TABLET | Freq: Four times a day (QID) | ORAL | Status: DC | PRN

## 2013-04-23 MED ORDER — INFLUENZA VAC SPLIT QUAD 0.5 ML IM SUSP
0.5000 mL | INTRAMUSCULAR | Status: AC
  Administered 2013-04-24: 11:00:00 0.5 mL via INTRAMUSCULAR
  Filled 2013-04-23: qty 0.5

## 2013-04-23 MED ORDER — ONDANSETRON HCL 4 MG/2ML IJ SOLN: 4.0000 mg | Freq: Four times a day (QID) | INTRAMUSCULAR | Status: DC | PRN

## 2013-04-23 MED ORDER — FUROSEMIDE 10 MG/ML IJ SOLN
INTRAMUSCULAR | Status: AC
  Administered 2013-04-23: 20 mg via INTRAVENOUS
  Filled 2013-04-23: qty 4

## 2013-04-23 MED ORDER — SODIUM CHLORIDE 0.9 % IJ SOLN
3.0000 mL | Freq: Two times a day (BID) | INTRAMUSCULAR | Status: DC
  Administered 2013-04-23 – 2013-04-24 (×4): 3 mL via INTRAVENOUS

## 2013-04-23 MED ORDER — SODIUM CHLORIDE 0.9 % IJ SOLN
3.0000 mL | INTRAMUSCULAR | Status: DC | PRN
  Administered 2013-04-23 (×2): 3 mL via INTRAVENOUS

## 2013-04-23 MED ORDER — SODIUM CHLORIDE 0.9 % IJ SOLN
3.0000 mL | Freq: Two times a day (BID) | INTRAMUSCULAR | Status: DC
  Administered 2013-04-23: 3 mL via INTRAVENOUS

## 2013-04-23 MED ORDER — SODIUM CHLORIDE 0.9 % IV SOLN
80.0000 mg | Freq: Once | INTRAVENOUS | Status: AC
  Administered 2013-04-23: 02:00:00 80 mg via INTRAVENOUS
  Filled 2013-04-23: qty 80

## 2013-04-23 MED ORDER — SODIUM CHLORIDE 0.9 % IV SOLN
8.0000 mg/h | INTRAVENOUS | Status: DC
  Administered 2013-04-23 (×3): 8 mg/h via INTRAVENOUS
  Filled 2013-04-23 (×6): qty 80

## 2013-04-23 MED ORDER — TRAMADOL HCL 50 MG PO TABS
50.0000 mg | ORAL_TABLET | Freq: Four times a day (QID) | ORAL | Status: DC | PRN
  Administered 2013-04-23: 50 mg via ORAL
  Filled 2013-04-23: qty 1

## 2013-04-23 MED ORDER — SODIUM CHLORIDE 0.9 % IV SOLN: 250.0000 mL | INTRAVENOUS | Status: DC | PRN

## 2013-04-23 MED ORDER — BOOST / RESOURCE BREEZE PO LIQD
1.0000 | Freq: Three times a day (TID) | ORAL | Status: DC
  Administered 2013-04-23 (×3): 1 via ORAL

## 2013-04-23 NOTE — Progress Notes (Signed)
[facility] PROGRESS NOTE  THAYDEN LEMIRE JSE:[id number] DOB: 1990/05/30 DOA: 04/22/2013 PCP: No PCP Per Patient  Assessment/Plan: 1. GI bleed: principle problem - Most likely secondary to history Crohn's disease. At this point plan will be for further evaluation and recommendations from gastroenterologist. EGD next a.m. We'll make n.p.o. after midnight - Continue protonic drip  2. Anemia - Patient is status post 2 units of packed red blood cell transfusion. Is likely secondary to GI bleed. Given plans for EGD assessment will avoid ferrous sulfate supplementation. - Reassess CBC next a.m.  3. Crohn's disease -We'll defer management to the gastroenterologist on board.  4. Protein calorie malnutrition - Resource breeze on board. After EGD will plan on advancing diet if okay with GI  Code Status: full Family Communication: No family at bedside. Discussed directly with patient Disposition Plan: Pending cessation of GI bleed and recommendations from gastroenterologist   Consultants:  GI  Procedures:  None  Antibiotics:  None  HPI/Subjective: Patient denies any abdominal discomfort. His main complaint today is that he is hungry.  Objective: Filed Vitals:   04/23/13 1415  BP: 109/62  Pulse: 77  Temp: 98 F (36.7 C)  Resp: 19    Intake/Output Summary (Last 24 hours) at 04/23/13 1721 Last data filed at 04/23/13 1441  Gross per 24 hour  Intake 1206.75 ml  Output    400 ml  Net 806.75 ml   Filed Weights   04/23/13 0037  Weight: 82.056 kg (180 lb 14.4 oz)    Exam:   General:  Patient in no acute distress, alert and awake  Cardiovascular: Regular rate and rhythm, no murmurs rubs or gallop  Respiratory: Clear to auscultation bilaterally no wheezes no rales  Abdomen: Soft, nontender, nondistended, no hepatomegaly.  Musculoskeletal: No cyanosis and no  clubbing  Data Reviewed: Basic Metabolic Panel:  Recent Labs Lab 04/17/13 0400 04/22/13 1903   NA 137 140  K 3.9 4.0  CL 103 106  CO2 26 23  GLUCOSE 82 97  BUN 9 30*  CREATININE 0.78 1.03  CALCIUM 8.6 8.2*   Liver Function Tests: No results found for this basename: AST, ALT, ALKPHOS, BILITOT, PROT, ALBUMIN,  in the last 168 hours No results found for this basename: LIPASE, AMYLASE,  in the last 168 hours No results found for this basename: AMMONIA,  in the last 168 hours CBC:  Recent Labs Lab 04/17/13 0400 04/22/13 1903 04/23/13 0938  WBC 9.4 20.3*  --   HGB 7.3* 6.9* 8.6*  HCT 23.9* 22.8* 26.8*  MCV 65.8* 72.2*  --   PLT 609* 525*  --    Cardiac Enzymes: No results found for this basename: CKTOTAL, CKMB, CKMBINDEX, TROPONINI,  in the last 168 hours BNP (last 3 results)  Recent Labs  04/22/13 1903  PROBNP 327.1*   CBG: No results found for this basename: GLUCAP,  in the last 168 hours  No results found for this or any previous visit (from the past 240 hour(s)).   Studies: Dg Chest 2 View  04/22/2013   CLINICAL DATA:  Bilateral leg swelling  EXAM: CHEST  2 VIEW  COMPARISON:  04/13/2013  FINDINGS: The heart size and mediastinal contours are within normal limits. Both lungs are clear. The visualized skeletal structures are unremarkable.  IMPRESSION: No active cardiopulmonary disease.   Electronically Signed   By: Franchot Gallo M.D.   On: 04/22/2013 20:18    Scheduled Meds: . feeding supplement (RESOURCE BREEZE)  1 Container Oral TID BM  . [  START ON 04/24/2013] influenza vac split quadrivalent PF  0.5 mL Intramuscular Tomorrow-1000  . [START ON 04/26/2013] pantoprazole (PROTONIX) IV  40 mg Intravenous Q12H  . sodium chloride  3 mL Intravenous Q12H  . sodium chloride  3 mL Intravenous Q12H   Continuous Infusions: . pantoprozole (PROTONIX) infusion 8 mg/hr (04/23/13 1217)    Active Problems:   ANEMIA-IRON DEFICIENCY   Anemia   Crohn's disease of ileum   Protein-calorie malnutrition, severe   GI bleed   Edema extremities    Time spent: > 35  minutes    Velvet Bathe  [facility] Pager [phone] If 7PM-7AM, please contact night-coverage at [email or web address], password [facility] 04/23/2013, 5:21 PM  LOS: 1 day

## 2013-04-23 NOTE — Progress Notes (Signed)
Patient is alert and oriented with no skin issue. Transported to unit via stretcher. Patient oriented to unit. Call bell is within reach. Bed is at the lowest position.  Melvie Paglia J. Lendell Caprice RN

## 2013-04-23 NOTE — Consult Note (Signed)
[facility] Consult Note  Referring Provider: No ref. provider found Primary Care Physician:  No PCP Per Patient Primary Gastroenterologist:  Dr.  Antony Contras Complaint: Black stools HPI: Shane Klein is an 22 y.o. black male  with history of Crohn's disease with recent intestinal obstruction which resolved with medical therapy on prednisone who presents with black stools and a low hemoglobin of 6.9 which is less than his baseline. He is also noticed lower extremity edema. He denies any nausea vomiting or abdominal pain.  Past Medical History  Diagnosis Date  . 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. Crohn's disease with intestinal obstruction 03/01/2012    **Patient does not have celiac disease.  False-positive   . Iron deficiency anemia   . GERD (gastroesophageal reflux disease)     Past Surgical History  Procedure Laterality Date  . Esophagogastroduodenoscopy  Oct 2010    Dr. Russella Dar: duodenitis, normal villi, likely Crohn's. Elevated Gliadin but normal TTG, IgA and IgA  . Colonoscopy  Oct 2010    Dr. Russella Dar: evidence of Crohn's at cecum, ascending colon, unable to intubate the terminal ileum. CTE with distal and terminal ileitis    Medications Prior to Admission  Medication Sig Dispense Refill  . feeding supplement, RESOURCE BREEZE, (RESOURCE BREEZE) LIQD Take 1 Container by mouth 3 (three) times daily between meals.  90 Container  0  . omeprazole (PRILOSEC) 20 MG capsule Take 1 capsule (20 mg total) by mouth daily.  30 capsule  0  . predniSONE (DELTASONE) 20 MG tablet Take 3 tablets (60 mg total) by mouth daily with breakfast.  90 tablet  0  . ferrous sulfate 325 (65 FE) MG tablet Take 1 tablet (325 mg total) by mouth 3 (three) times daily with meals.  90 tablet  0    Allergies:  Allergies  Allergen Reactions  . Gluten Meal Other (See Comments)    Stomach pain  . Wheat Bran Other (See Comments)    Belly pain    Family History  Problem Relation Age of Onset  . Diabetes Maternal Aunt    . Diabetes Maternal Grandmother   . Colon cancer Neg Hx   . Crohn's disease Maternal Aunt     Social History:  reports that he quit smoking about 2 months ago. His smoking use included Cigarettes. He smoked 0.00 packs per day. He has never used smokeless tobacco. He reports that he does not drink alcohol or use illicit drugs.  Review of Systems: negative except basically as above   Blood pressure 109/62, pulse 77, temperature 98 F (36.7 C), temperature source Oral, resp. rate 19, height 5\' 10"  (1.778 m), weight 82.056 kg (180 lb 14.4 oz), SpO2 100.00%. Head: Normocephalic, without obvious abnormality, atraumatic Neck: no adenopathy, no carotid bruit, no JVD, supple, symmetrical, trachea midline and thyroid not enlarged, symmetric, no tenderness/mass/nodules Resp: clear to auscultation bilaterally Cardio: regular rate and rhythm, S1, S2 normal, no murmur, click, rub or gallop GI: Abdomen soft nondistended no hepatosplenomegaly mass or guarding Extremities: extremities normal, atraumatic, no cyanosis or edema  Results for orders placed during the hospital encounter of 04/22/13 (from the past 48 hour(s))  CBC     Status: Abnormal   Collection Time    04/22/13  7:03 PM      Result Value Range   WBC 20.3 (*) 4.0 - 10.5 K/uL   RBC 3.16 (*) 4.22 - 5.81 MIL/uL   Hemoglobin 6.9 (*) 13.0 - 17.0 g/dL   Comment: REPEATED TO VERIFY  CRITICAL RESULT CALLED TO, READ BACK BY AND VERIFIED WITH:     J.DAVIS,RN 1924 04/22/13 M.CAMPBELL   HCT 22.8 (*) 39.0 - 52.0 %   MCV 72.2 (*) 78.0 - 100.0 fL   MCH 21.8 (*) 26.0 - 34.0 pg   MCHC 30.3  30.0 - 36.0 g/dL   RDW 40.9 (*) 81.1 - 91.4 %   Platelets 525 (*) 150 - 400 K/uL  BASIC METABOLIC PANEL     Status: Abnormal   Collection Time    04/22/13  7:03 PM      Result Value Range   Sodium 140  135 - 145 mEq/L   Potassium 4.0  3.5 - 5.1 mEq/L   Chloride 106  96 - 112 mEq/L   CO2 23  19 - 32 mEq/L   Glucose, Bld 97  70 - 99 mg/dL   BUN 30 (*) 6  - 23 mg/dL   Creatinine, Ser 7.82  0.50 - 1.35 mg/dL   Calcium 8.2 (*) 8.4 - 10.5 mg/dL   GFR calc non Af Amer >90  >90 mL/min   GFR calc Af Amer >90  >90 mL/min   Comment: (NOTE)     The eGFR has been calculated using the CKD EPI equation.     This calculation has not been validated in all clinical situations.     eGFR's persistently <90 mL/min signify possible Chronic Kidney     Disease.  PRO B NATRIURETIC PEPTIDE     Status: Abnormal   Collection Time    04/22/13  7:03 PM      Result Value Range   Pro B Natriuretic peptide (BNP) 327.1 (*) 0 - 125 pg/mL  POCT I-STAT TROPONIN I     Status: None   Collection Time    04/22/13  7:08 PM      Result Value Range   Troponin i, poc 0.01  0.00 - 0.08 ng/mL   Comment 3            Comment: Due to the release kinetics of cTnI,     a negative result within the first hours     of the onset of symptoms does not rule out     myocardial infarction with certainty.     If myocardial infarction is still suspected,     repeat the test at appropriate intervals.  OCCULT BLOOD, POC DEVICE     Status: Abnormal   Collection Time    04/22/13 10:50 PM      Result Value Range   Fecal Occult Bld POSITIVE (*) NEGATIVE  TYPE AND SCREEN     Status: None   Collection Time    04/22/13 11:05 PM      Result Value Range   ABO/RH(D) A POS     Antibody Screen NEG     Sample Expiration 04/25/2013     Unit Number [id number]     Blood Component Type RED CELLS,LR     Unit division 00     Status of Unit ISSUED     Transfusion Status OK TO TRANSFUSE     Crossmatch Result Compatible     Unit Number [id number]     Blood Component Type RED CELLS,LR     Unit division 00     Status of Unit ISSUED     Transfusion Status OK TO TRANSFUSE     Crossmatch Result Compatible    PREPARE RBC (CROSSMATCH)     Status: None   Collection Time  04/22/13 11:05 PM      Result Value Range   Order Confirmation ORDER PROCESSED BY BLOOD BANK    ABO/RH     Status: None    Collection Time    04/22/13 11:05 PM      Result Value Range   ABO/RH(D) A POS    HEMOGLOBIN AND HEMATOCRIT, BLOOD     Status: Abnormal   Collection Time    04/23/13  9:38 AM      Result Value Range   Hemoglobin 8.6 (*) 13.0 - 17.0 g/dL   Comment: POST TRANSFUSION SPECIMEN   HCT 26.8 (*) 39.0 - 52.0 %   Dg Chest 2 View  04/22/2013   CLINICAL DATA:  Bilateral leg swelling  EXAM: CHEST  2 VIEW  COMPARISON:  04/13/2013  FINDINGS: The heart size and mediastinal contours are within normal limits. Both lungs are clear. The visualized skeletal structures are unremarkable.  IMPRESSION: No active cardiopulmonary disease.   Electronically Signed   By: Marlan Palau M.D.   On: 04/22/2013 20:18    Assessment: Crohn's disease with some evidence of possible upper GI bleeding Plan:  Treat with proton pump inhibitor and will proceed with EGD in the morning. Adell Koval C 04/23/2013, 3:16 PM

## 2013-04-24 ENCOUNTER — Encounter (HOSPITAL_COMMUNITY)
Admission: EM | Disposition: A | Discharge status: Home / Self Care | Payer: Self-pay | Source: Home / Self Care | Attending: Family Medicine

## 2013-04-24 ENCOUNTER — Encounter (HOSPITAL_COMMUNITY): Payer: Self-pay | Admitting: Gastroenterology

## 2013-04-24 DIAGNOSIS — R600 Localized edema: Secondary | ICD-10-CM

## 2013-04-24 DIAGNOSIS — K5090 Crohn's disease, unspecified, without complications: Secondary | ICD-10-CM

## 2013-04-24 DIAGNOSIS — D509 Iron deficiency anemia, unspecified: Secondary | ICD-10-CM

## 2013-04-24 HISTORY — PX: ESOPHAGOGASTRODUODENOSCOPY: SHX5428

## 2013-04-24 LAB — CBC
MCH: 24.4 pg — ABNORMAL LOW (ref 26.0–34.0)
MCV: 75 fL — ABNORMAL LOW (ref 78.0–100.0)
Platelets: 383 10*3/uL (ref 150–400)
RBC: 3.56 MIL/uL — ABNORMAL LOW (ref 4.22–5.81)
RDW: 28 % — ABNORMAL HIGH (ref 11.5–15.5)
WBC: 9.5 10*3/uL (ref 4.0–10.5)

## 2013-04-24 LAB — TYPE AND SCREEN
ABO/RH(D): A POS
Unit division: 0

## 2013-04-24 LAB — BASIC METABOLIC PANEL
Calcium: 8 mg/dL — ABNORMAL LOW (ref 8.4–10.5)
Chloride: 104 mEq/L (ref 96–112)
Creatinine, Ser: 0.82 mg/dL (ref 0.50–1.35)
GFR calc Af Amer: >90 mL/min (ref >90)
GFR calc non Af Amer: >90 mL/min (ref >90)
Sodium: 141 mEq/L (ref 137–147)

## 2013-04-24 SURGERY — EGD (ESOPHAGOGASTRODUODENOSCOPY)
Anesthesia: Moderate Sedation

## 2013-04-24 MED ORDER — MIDAZOLAM HCL 10 MG/2ML IJ SOLN
INTRAMUSCULAR | Status: DC | PRN
  Administered 2013-04-24: 2 mg via INTRAVENOUS
  Administered 2013-04-24: 1 mg via INTRAVENOUS

## 2013-04-24 MED ORDER — PANTOPRAZOLE SODIUM 40 MG PO TBEC: 40.0000 mg | DELAYED_RELEASE_TABLET | Freq: Every day | ORAL | Status: DC

## 2013-04-24 MED ORDER — DIPHENHYDRAMINE HCL 50 MG/ML IJ SOLN
INTRAMUSCULAR | Status: DC | PRN
  Administered 2013-04-24: 25 mg via INTRAVENOUS

## 2013-04-24 MED ORDER — FENTANYL CITRATE 0.05 MG/ML IJ SOLN
INTRAMUSCULAR | Status: DC | PRN
  Administered 2013-04-24 (×2): 25 ug via INTRAVENOUS

## 2013-04-24 MED ORDER — MIDAZOLAM HCL 5 MG/ML IJ SOLN
INTRAMUSCULAR | Status: AC
  Filled 2013-04-24: qty 2

## 2013-04-24 MED ORDER — DIPHENHYDRAMINE HCL 50 MG/ML IJ SOLN
INTRAMUSCULAR | Status: AC
  Filled 2013-04-24: qty 1

## 2013-04-24 MED ORDER — SODIUM CHLORIDE 0.9 % IV SOLN
INTRAVENOUS | Status: DC
  Administered 2013-04-24: 500 mL via INTRAVENOUS

## 2013-04-24 MED ORDER — SODIUM CHLORIDE 0.9 % IV SOLN: INTRAVENOUS | Status: DC

## 2013-04-24 MED ORDER — BOOST / RESOURCE BREEZE PO LIQD: 1.0000 | Freq: Three times a day (TID) | ORAL | Status: DC

## 2013-04-24 MED ORDER — FENTANYL CITRATE 0.05 MG/ML IJ SOLN
INTRAMUSCULAR | Status: AC
  Filled 2013-04-24: qty 2

## 2013-04-24 MED ORDER — BUTAMBEN-TETRACAINE-BENZOCAINE 2-2-14 % EX AERO
INHALATION_SPRAY | CUTANEOUS | Status: DC | PRN
  Administered 2013-04-24: 2 via TOPICAL

## 2013-04-24 MED ORDER — FERROUS SULFATE 325 (65 FE) MG PO TABS: 325.0000 mg | ORAL_TABLET | Freq: Three times a day (TID) | ORAL | Status: DC

## 2013-04-24 NOTE — Discharge Summary (Signed)
Physician Discharge Summary  Shane Klein:[id number] DOB: 1990-06-13 DOA: 04/22/2013  PCP: No PCP Per Patient  Admit date: 04/22/2013 Discharge date: 04/24/2013  Time spent: > 35 minutes  Recommendations for Outpatient Follow-up:  1. Please reassess hemoglobin levels on followup.  Discharge Diagnoses:  Principal Problem:   GI bleed Active Problems:   ANEMIA-IRON DEFICIENCY   Anemia   Crohn's disease of ileum   Protein-calorie malnutrition, severe   Edema extremities   Discharge Condition: Stable  Diet recommendation: General  Filed Weights   04/23/13 0037  Weight: 82.056 kg (180 lb 14.4 oz)    History of present illness:  Patient is a 22 year old male with history of Crohn's disease, protein calorie malnutrition, severe iron deficiency anemia. Who presented to the hospital complaining of bilateral pedal edema in the put him to have a hemoglobin of 6.9 and stool guiac positive in ED.  Hospital Course:  1. GI bleed: principle problem - Will discharge on Protonix. - Upper endoscopy negative for source - Patient denies any black tarry stools.  2. Anemia  - Patient is status post 2 units of packed red blood cell transfusion. Is likely secondary to GI bleed. Given plans for  -CBC stable and improved today. Recommended iron supplementation. Patient aware and verbalizes agreement.   3. Crohn's disease  -Patient to followup with gastroenterologist as mentioned below.  4. Protein calorie malnutrition  - Resource breeze on board. We'll recommend general diet on discharge.  Patient reports being recently incarcerated for 51 days and subsequently having poor oral intake during that time frame.  5. Pedal edema - Have recommended elevation of legs, improved oral intake, compression stockings. Patient understands and verbalizes agreement.  Procedures:  Upper endoscopy  Consultations:  Gastroenterology  Discharge Exam: Filed Vitals:   04/24/13 1010  BP: 113/68   Pulse: 62  Temp:   Resp: 15    General: Pt in NAD, alert and awake Cardiovascular: RRR, no MRG Respiratory: CTA BL, no wheezes  Discharge Instructions  Discharge Orders   Future Orders Complete By Expires   Call MD for:  severe uncontrolled pain  As directed    Call MD for:  temperature >100.4  As directed    Diet - low sodium heart healthy  As directed    Discharge instructions  As directed    Comments:     Please follow up with your gastroenterologist in 1-2 weeks or sooner should any new concerns arise.  Also take your iron supplementation 3 times a day   Increase activity slowly  As directed        Medication List    STOP taking these medications       omeprazole 20 MG capsule  Commonly known as:  PRILOSEC      TAKE these medications       feeding supplement (RESOURCE BREEZE) Liqd  Take 1 Container by mouth 3 (three) times daily between meals.     ferrous sulfate 325 (65 FE) MG tablet  Take 1 tablet (325 mg total) by mouth 3 (three) times daily with meals.     pantoprazole 40 MG tablet  Commonly known as:  PROTONIX  Take 1 tablet (40 mg total) by mouth daily.     predniSONE 20 MG tablet  Commonly known as:  DELTASONE  Take 3 tablets (60 mg total) by mouth daily with breakfast.       Allergies  Allergen Reactions  . Gluten Meal Other (See Comments)    Stomach pain  .  Wheat Bran Other (See Comments)    Belly pain      The results of significant diagnostics from this hospitalization (including imaging, microbiology, ancillary and laboratory) are listed below for reference.    Significant Diagnostic Studies: Dg Chest 2 View  04/22/2013   CLINICAL DATA:  Bilateral leg swelling  EXAM: CHEST  2 VIEW  COMPARISON:  04/13/2013  FINDINGS: The heart size and mediastinal contours are within normal limits. Both lungs are clear. The visualized skeletal structures are unremarkable.  IMPRESSION: No active cardiopulmonary disease.   Electronically Signed   By:  Franchot Gallo M.D.   On: 04/22/2013 20:18   Ct Abdomen Pelvis W Contrast  04/13/2013   CLINICAL DATA:  Crohn's and celiac disease. Abdominal distention, nausea, pain.  EXAM: CT ABDOMEN AND PELVIS WITH CONTRAST  TECHNIQUE: Multidetector CT imaging of the abdomen and pelvis was performed using the standard protocol following bolus administration of intravenous contrast.  CONTRAST:  29m OMNIPAQUE IOHEXOL 300 MG/ML SOLN, 1093mOMNIPAQUE IOHEXOL 300 MG/ML SOLN  COMPARISON:  10/05/2012  FINDINGS: Visualized lung bases clear. Increase in mild to moderate abdominal ascites. Unremarkable liver, nondistended gallbladder, spleen, adrenal glands, and kidneys, pancreas, aorta, portal vein. Stomach is nondilated. There is incomplete progression of the oral contrast material beyond the proximal small bowel which is decompressed.  There is a loop of a markedly dilated distal small bowel which protrudes into the pelvis, with circumferential wall thickening and mucosal enhancement. This is probably a loop of distal jejunum. There is a 2nd distended loop of small bowel the right mid abdomen with small bowel feces sign. Both are associated with a poorly marginated a right lower quadrant enhancing soft tissue region measuring approximately 5.7 cm transverse diameter, and near the region of the terminal ileum which is poorly defined. There are adjacent enlarged mesenteric lymph nodes. The colon is nondistended.  The urinary bladder is physiologically distended. No definite abscess. No free air.  IMPRESSION: 1. High-grade mid/distal small bowel obstruction, possibly at 2 separate levels, associated with a right lower quadrant enhancing inflammatory process, more conspicuous than on prior exam. Suspect progressive active Crohn's disease. 2. No evidence of abscess or perforation. 3. Increase in abdominal ascites.   Electronically Signed   By: DaArne Cleveland.D.   On: 04/13/2013 19:24   Dg Abd 2 Views  04/15/2013   CLINICAL DATA:   Crohn's disease, follow-up small bowel obstruction  EXAM: ABDOMEN - 2 VIEW  COMPARISON:  CT abdomen pelvis dated 04/13/2013  FINDINGS: Mildly prominent but nondilated loops of small bowel in the left upper abdomen.  Residual contrast throughout the colon, which is not decompressed.  No radiographic findings to suggest small bowel obstruction.  Enteric tube is incompletely visualized in the left upper abdomen.  Visualized osseous structures are within normal limits.  IMPRESSION: No radiographic findings to suggest small bowel obstruction.  Residual contrast throughout the colon.   Electronically Signed   By: SrJulian Hy.D.   On: 04/15/2013 10:58   Dg Abd Acute W/chest  04/13/2013   CLINICAL DATA:  Abdominal pain. History of Crohn's disease and celiac disease.  EXAM: ACUTE ABDOMEN SERIES (ABDOMEN 2 VIEW & CHEST 1 VIEW)  COMPARISON:  10/05/2012, CT of the abdomen pelvis.  FINDINGS: There is a distended loop small bowel in the left mid abdomen. No other bowel dilation is seen. There are air-fluid levels on the erect view. No free air.  The soft tissues are unremarkable.  Normal bony structures.  Included frontal  radiograph of the chest shows a normal heart, mediastinum and hila and clear lungs.  IMPRESSION: 1. Single loop of distended small bowel in the left mid abdomen. No other bowel dilation. There are air-fluid levels on the erect view suggesting a diffuse adynamic ileus. These findings may reflect active inflammatory bowel disease. Recommend followup CT the abdomen pelvis with contrast. 2. No free air. 3. Normal chest radiograph.   Electronically Signed   By: Lajean Manes M.D.   On: 04/13/2013 16:33    Microbiology: No results found for this or any previous visit (from the past 240 hour(s)).   Labs: Basic Metabolic Panel:  Recent Labs Lab 04/22/13 1903 04/24/13 0840  NA 140 141  K 4.0 4.3  CL 106 104  CO2 23 28  GLUCOSE 97 78  BUN 30* 19  CREATININE 1.03 0.82  CALCIUM 8.2* 8.0*    Liver Function Tests: No results found for this basename: AST, ALT, ALKPHOS, BILITOT, PROT, ALBUMIN,  in the last 168 hours No results found for this basename: LIPASE, AMYLASE,  in the last 168 hours No results found for this basename: AMMONIA,  in the last 168 hours CBC:  Recent Labs Lab 04/22/13 1903 04/23/13 0938 04/24/13 0840  WBC 20.3*  --  9.5  HGB 6.9* 8.6* 8.7*  HCT 22.8* 26.8* 26.7*  MCV 72.2*  --  75.0*  PLT 525*  --  383   Cardiac Enzymes: No results found for this basename: CKTOTAL, CKMB, CKMBINDEX, TROPONINI,  in the last 168 hours BNP: BNP (last 3 results)  Recent Labs  04/22/13 1903  PROBNP 327.1*   CBG: No results found for this basename: GLUCAP,  in the last 168 hours     Signed:  Velvet Bathe  [facility] 04/24/2013, 10:43 AM

## 2013-04-24 NOTE — Progress Notes (Signed)
[facility] Progress Note  Subjective: No further stools, lower extremity edema slightly improved  Objective: Vital signs in last 24 hours: Temp:  [97.4 F (36.3 C)-98.3 F (36.8 C)] 97.7 F (36.5 C) (12/30 0915) Pulse Rate:  [55-77] 63 (12/30 0940) Resp:  [13-19] 13 (12/30 0940) BP: (100-113)/(46-65) 107/65 mmHg (12/30 0940) SpO2:  [99 %-100 %] 100 % (12/30 0940) Weight change:    PE: Abdomen soft  Lab Results: Results for orders placed during the hospital encounter of 04/22/13 (from the past 24 hour(s))  CBC     Status: Abnormal   Collection Time    04/24/13  8:40 AM      Result Value Range   WBC 9.5  4.0 - 10.5 K/uL   RBC 3.56 (*) 4.22 - 5.81 MIL/uL   Hemoglobin 8.7 (*) 13.0 - 17.0 g/dL   HCT 16.1 (*) 09.6 - 04.5 %   MCV 75.0 (*) 78.0 - 100.0 fL   MCH 24.4 (*) 26.0 - 34.0 pg   MCHC 32.6  30.0 - 36.0 g/dL   RDW 40.9 (*) 81.1 - 91.4 %   Platelets 383  150 - 400 K/uL    Studies/Results: Dg Chest 2 View  04/22/2013   CLINICAL DATA:  Bilateral leg swelling  EXAM: CHEST  2 VIEW  COMPARISON:  04/13/2013  FINDINGS: The heart size and mediastinal contours are within normal limits. Both lungs are clear. The visualized skeletal structures are unremarkable.  IMPRESSION: No active cardiopulmonary disease.   Electronically Signed   By: Marlan Palau M.D.   On: 04/22/2013 20:18   EGD normal colon but no source of upper GI bleed   Assessment: Previously reported dark stools with slight decrease in baseline hemoglobin, no upper GI source of bleeding  Plan: Iron therapy or consider transfusion for hemoglobin less than 7. Stools have normalized in color according to the patient. We'll sign off for now    Aminata Buffalo C 04/24/2013, 9:45 AM

## 2013-04-24 NOTE — Op Note (Signed)
[facility] [address] [place], [address]   ENDOSCOPY PROCEDURE REPORT  PATIENT: [name], Shane Klein  MR#: [id number] BIRTHDATE: [birth year] , 22  yrs. old GENDER: Male ENDOSCOPIST:Noland Pizano Madilyn Fireman, MD REFERRED BY: PROCEDURE DATE:  04/24/2013 PROCEDURE: ASA CLASS: INDICATIONS: MEDICATION:    Benadryl25 mg fentanyl 50 mcg Versed 3 mg TOPICAL ANESTHETIC:    Cetacaine spray  DESCRIPTION OF PROCEDURE:   esophagus: Normal Stomach: Normal Duodenum: Normal     COMPLICATIONS: None  ENDOSCOPIC IMPRESSION:normal endoscopy. No source of GI bleeding.  RECOMMENDATIONS:monitor stools and hemoglobin.    _______________________________ Rosalie DoctorDorena Cookey, MD 04/24/2013 9:45 AM

## 2013-04-24 NOTE — Progress Notes (Signed)
Utilization review completed.  

## 2013-04-25 ENCOUNTER — Encounter (HOSPITAL_COMMUNITY): Payer: Self-pay | Admitting: Gastroenterology

## 2013-06-21 ENCOUNTER — Emergency Department (HOSPITAL_COMMUNITY): Payer: Medicaid Other

## 2013-06-21 ENCOUNTER — Encounter (HOSPITAL_COMMUNITY): Payer: Self-pay | Admitting: Emergency Medicine

## 2013-06-21 ENCOUNTER — Emergency Department (HOSPITAL_COMMUNITY)
Admission: EM | Admit: 2013-06-21 | Discharge: 2013-06-21 | Disposition: A | Discharge status: Home / Self Care | Payer: Medicaid Other | Attending: Emergency Medicine | Admitting: Emergency Medicine

## 2013-06-21 DIAGNOSIS — R630 Anorexia: Secondary | ICD-10-CM | POA: Insufficient documentation

## 2013-06-21 DIAGNOSIS — Z8709 Personal history of other diseases of the respiratory system: Secondary | ICD-10-CM | POA: Insufficient documentation

## 2013-06-21 DIAGNOSIS — R112 Nausea with vomiting, unspecified: Secondary | ICD-10-CM | POA: Insufficient documentation

## 2013-06-21 DIAGNOSIS — R1032 Left lower quadrant pain: Secondary | ICD-10-CM | POA: Insufficient documentation

## 2013-06-21 DIAGNOSIS — R109 Unspecified abdominal pain: Secondary | ICD-10-CM

## 2013-06-21 DIAGNOSIS — R197 Diarrhea, unspecified: Secondary | ICD-10-CM | POA: Insufficient documentation

## 2013-06-21 DIAGNOSIS — Z87891 Personal history of nicotine dependence: Secondary | ICD-10-CM | POA: Insufficient documentation

## 2013-06-21 DIAGNOSIS — Z8719 Personal history of other diseases of the digestive system: Secondary | ICD-10-CM | POA: Insufficient documentation

## 2013-06-21 DIAGNOSIS — Z862 Personal history of diseases of the blood and blood-forming organs and certain disorders involving the immune mechanism: Secondary | ICD-10-CM | POA: Insufficient documentation

## 2013-06-21 LAB — CBC WITH DIFFERENTIAL/PLATELET
BASOS ABS: 0 10*3/uL (ref 0.0–0.1)
Basophils Relative: 0 % (ref 0–1)
Eosinophils Absolute: 0.6 10*3/uL (ref 0.0–0.7)
Eosinophils Relative: 7 % — ABNORMAL HIGH (ref 0–5)
HEMATOCRIT: 25.2 % — AB (ref 39.0–52.0)
HEMOGLOBIN: 7.8 g/dL — AB (ref 13.0–17.0)
LYMPHS PCT: 20 % (ref 12–46)
Lymphs Abs: 1.7 10*3/uL (ref 0.7–4.0)
MCH: 21.9 pg — ABNORMAL LOW (ref 26.0–34.0)
MCHC: 31 g/dL (ref 30.0–36.0)
MCV: 70.8 fL — ABNORMAL LOW (ref 78.0–100.0)
MONO ABS: 1 10*3/uL (ref 0.1–1.0)
Monocytes Relative: 12 % (ref 3–12)
Neutro Abs: 5.2 10*3/uL (ref 1.7–7.7)
Neutrophils Relative %: 61 % (ref 43–77)
Platelets: 627 10*3/uL — ABNORMAL HIGH (ref 150–400)
RBC: 3.56 MIL/uL — ABNORMAL LOW (ref 4.22–5.81)
RDW: 19.3 % — ABNORMAL HIGH (ref 11.5–15.5)
WBC: 8.6 10*3/uL (ref 4.0–10.5)

## 2013-06-21 LAB — COMPREHENSIVE METABOLIC PANEL
ALK PHOS: 55 U/L (ref 39–117)
ALT: 6 U/L (ref 0–53)
AST: 10 U/L (ref 0–37)
Albumin: 2.5 g/dL — ABNORMAL LOW (ref 3.5–5.2)
BUN: 12 mg/dL (ref 6–23)
CHLORIDE: 105 meq/L (ref 96–112)
CO2: 24 mEq/L (ref 19–32)
CREATININE: 0.84 mg/dL (ref 0.50–1.35)
Calcium: 8.6 mg/dL (ref 8.4–10.5)
GFR calc non Af Amer: >90 mL/min (ref >90)
Glucose, Bld: 87 mg/dL (ref 70–99)
POTASSIUM: 3.9 meq/L (ref 3.7–5.3)
Sodium: 139 mEq/L (ref 137–147)
Total Protein: 6.2 g/dL (ref 6.0–8.3)

## 2013-06-21 LAB — URINALYSIS, ROUTINE W REFLEX MICROSCOPIC
BILIRUBIN URINE: NEGATIVE
GLUCOSE, UA: NEGATIVE mg/dL
Hgb urine dipstick: NEGATIVE
Ketones, ur: NEGATIVE mg/dL
LEUKOCYTES UA: NEGATIVE
Nitrite: NEGATIVE
PH: 5.5 (ref 5.0–8.0)
Protein, ur: NEGATIVE mg/dL
SPECIFIC GRAVITY, URINE: 1.016 (ref 1.005–1.030)
Urobilinogen, UA: 0.2 mg/dL (ref 0.0–1.0)

## 2013-06-21 MED ORDER — HYDROMORPHONE HCL PF 2 MG/ML IJ SOLN: 4.0000 mg | INTRAMUSCULAR | Status: DC

## 2013-06-21 MED ORDER — ONDANSETRON HCL 4 MG/2ML IJ SOLN
4.0000 mg | Freq: Once | INTRAMUSCULAR | Status: AC
  Administered 2013-06-21: 4 mg via INTRAVENOUS
  Filled 2013-06-21: qty 2

## 2013-06-21 MED ORDER — PREDNISONE 20 MG PO TABS: 60.0000 mg | ORAL_TABLET | ORAL | Status: AC

## 2013-06-21 MED ORDER — HYDROMORPHONE HCL PF 1 MG/ML IJ SOLN
1.0000 mg | Freq: Once | INTRAMUSCULAR | Status: AC
  Administered 2013-06-21: 1 mg via INTRAVENOUS
  Filled 2013-06-21: qty 1

## 2013-06-21 MED ORDER — HYDROCODONE-ACETAMINOPHEN 5-325 MG PO TABS: 1.0000 | ORAL_TABLET | Freq: Four times a day (QID) | ORAL | Status: DC | PRN

## 2013-06-21 MED ORDER — SODIUM CHLORIDE 0.9 % IV BOLUS (SEPSIS)
1000.0000 mL | Freq: Once | INTRAVENOUS | Status: AC
  Administered 2013-06-21: 1000 mL via INTRAVENOUS

## 2013-06-21 MED ORDER — PREDNISONE 20 MG PO TABS
60.0000 mg | ORAL_TABLET | ORAL | Status: AC
  Administered 2013-06-21: 60 mg via ORAL
  Filled 2013-06-21: qty 3

## 2013-06-21 NOTE — ED Notes (Signed)
Pt reports still unable to provide a urine specimen.

## 2013-06-21 NOTE — ED Notes (Signed)
Pt states for past couple days has had abdominal cramping w/ diarrhea, denies nausea or vomiting, states has hx of chrons disease and this feels like a flare up, pt states pain hasn't gotten any worse but no better.

## 2013-06-21 NOTE — ED Notes (Signed)
Pt encouraged to void when able. 

## 2013-06-21 NOTE — ED Provider Notes (Signed)
CSN: [id number]     Arrival date & time 06/21/13  1741 History   First MD Initiated Contact with Patient 06/21/13 1744     Chief Complaint  Patient presents with  . Abdominal Pain     HPI  Patient presents with left lower quadrant abdominal pain.  Pain began 3 days ago without clear precipitant.  Since onset has been nausea, innumerable episodes of vomiting and diarrhea, and persistent, sharp, severe pain. No relief with anything.  There is associated anorexia.  No bloody stool, no blood in vomit, no chest pain or dyspnea. Patient does have history of Crohn's disease, and states that this is similar to prior exacerbations. Patient is a former smoker. Patient was admitted 2 months ago after similar pain.   Past Medical History  Diagnosis Date  . 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. Crohn's disease with intestinal obstruction 03/01/2012    **Patient does not have celiac disease.  False-positive   . Iron deficiency anemia   . GERD (gastroesophageal reflux disease)    Past Surgical History  Procedure Laterality Date  . Esophagogastroduodenoscopy  Oct 2010    Dr. Fuller Plan: duodenitis, normal villi, likely Crohn's. Elevated Gliadin but normal TTG, IgA and IgA  . Colonoscopy  Oct 2010    Dr. Fuller Plan: evidence of Crohn's at cecum, ascending colon, unable to intubate the terminal ileum. CTE with distal and terminal ileitis  . Esophagogastroduodenoscopy N/A 04/24/2013    Procedure: ESOPHAGOGASTRODUODENOSCOPY (EGD);  Surgeon: Missy Sabins, MD;  Location: [facility] ENDOSCOPY;  Service: Endoscopy;  Laterality: N/A;   Family History  Problem Relation Age of Onset  . Diabetes Maternal Aunt   . Diabetes Maternal Grandmother   . Colon cancer Neg Hx   . Crohn's disease Maternal Aunt    History  Substance Use Topics  . Smoking status: Former Smoker    Types: Cigarettes    Quit date: 01/24/2013  . Smokeless tobacco: Never Used     Comment: 2-3 cigarettes per day  . Alcohol Use: No     Comment: rarely    Review of  Systems  Constitutional:       Per HPI, otherwise negative  HENT:       Per HPI, otherwise negative  Respiratory:       Per HPI, otherwise negative  Cardiovascular:       Per HPI, otherwise negative  Gastrointestinal: Positive for nausea, vomiting, abdominal pain and diarrhea.  Endocrine:       Negative aside from HPI  Genitourinary:       Neg aside from HPI   Musculoskeletal:       Per HPI, otherwise negative  Skin: Negative.   Neurological: Negative for syncope.      Allergies  Gluten meal and Wheat bran  Home Medications   Current Outpatient Rx  Name  Route  Sig  Dispense  Refill  . acetaminophen (TYLENOL) 325 MG tablet   Oral   Take 650 mg by mouth every 6 (six) hours as needed (pain).          BP 130/84  Pulse 77  Temp(Src) 98.6 F (37 C) (Oral)  Resp 18  SpO2 100% Physical Exam  Nursing note and vitals reviewed. Constitutional: He is oriented to person, place, and time. He appears well-developed. No distress.  HENT:  Head: Normocephalic and atraumatic.  Eyes: Conjunctivae and EOM are normal.  Cardiovascular: Normal rate and regular rhythm.   Pulmonary/Chest: Effort normal. No stridor. No respiratory distress.  Abdominal: He exhibits  no distension. There is tenderness in the suprapubic area and left lower quadrant. There is guarding. There is no rigidity and no rebound.  Musculoskeletal: He exhibits no edema.  Neurological: He is alert and oriented to person, place, and time.  Skin: Skin is warm and dry.  Psychiatric: He has a normal mood and affect.    ED Course  Procedures (including critical care time) Labs Review Labs Reviewed  CBC WITH DIFFERENTIAL  COMPREHENSIVE METABOLIC PANEL  URINALYSIS, ROUTINE W REFLEX MICROSCOPIC   Imaging Review No results found.     I reviewed the patient's chart, he has 8 prior abdominal CT scans in 3 years   2205: Patient substantially better in appearance.  He is tolerating liquids.  I had a lengthy  conversation with him and family members about the need for ongoing management of his Crohn's disease.  Recommend followup in clinic at an academic [facility]  MDM   This young male with Crohn's disease presents with abdominal pain.  On the patient is awake, alert, afebrile, hemodynamically stable.  The patient has had 8 CT scans within the past 3 years, and with his clinical improvement additional CT scan was not indicated today.  This improvement patient discharged in stable condition to follow up with his gastroenterologist.  In addition, we had a conversation about additional resources for Crohn's disease management.   Carmin Muskrat, MD 06/21/13 [phone]

## 2013-06-21 NOTE — ED Notes (Signed)
Bed: LI10 Expected date:  Expected time:  Means of arrival:  Comments: EMS-chron's flare up

## 2013-06-21 NOTE — ED Notes (Signed)
Per EMS pt started having chron's "flareup" 3 days ago, having abdominal cramping, denies n/v/d,no pain on palpation.

## 2013-06-21 NOTE — Discharge Instructions (Signed)
As discussed, with your history of Crohn's disease, it is very important that you have a followup with a gastroenterologist.  You may consider researching nearby academic medical institutions to find information on the clinics that specialize in Crohn's disease care.  Please return here for concerning changes in your condition.

## 2013-07-02 ENCOUNTER — Encounter (HOSPITAL_COMMUNITY): Payer: Self-pay | Admitting: Emergency Medicine

## 2013-07-02 ENCOUNTER — Emergency Department (HOSPITAL_COMMUNITY)
Admission: EM | Admit: 2013-07-02 | Discharge: 2013-07-02 | Disposition: A | Discharge status: Home / Self Care | Payer: Medicaid Other | Attending: Emergency Medicine | Admitting: Emergency Medicine

## 2013-07-02 DIAGNOSIS — K5090 Crohn's disease, unspecified, without complications: Secondary | ICD-10-CM

## 2013-07-02 DIAGNOSIS — Z87891 Personal history of nicotine dependence: Secondary | ICD-10-CM | POA: Insufficient documentation

## 2013-07-02 DIAGNOSIS — Z862 Personal history of diseases of the blood and blood-forming organs and certain disorders involving the immune mechanism: Secondary | ICD-10-CM | POA: Insufficient documentation

## 2013-07-02 DIAGNOSIS — Z8709 Personal history of other diseases of the respiratory system: Secondary | ICD-10-CM | POA: Insufficient documentation

## 2013-07-02 DIAGNOSIS — R109 Unspecified abdominal pain: Secondary | ICD-10-CM

## 2013-07-02 LAB — URINALYSIS, DIPSTICK ONLY
Bilirubin Urine: NEGATIVE
Glucose, UA: NEGATIVE mg/dL
HGB URINE DIPSTICK: NEGATIVE
Ketones, ur: NEGATIVE mg/dL
Leukocytes, UA: NEGATIVE
NITRITE: NEGATIVE
PH: 5.5 (ref 5.0–8.0)
Protein, ur: NEGATIVE mg/dL
SPECIFIC GRAVITY, URINE: 1.019 (ref 1.005–1.030)
Urobilinogen, UA: 0.2 mg/dL (ref 0.0–1.0)

## 2013-07-02 LAB — CBC WITH DIFFERENTIAL/PLATELET
BASOS PCT: 0 % (ref 0–1)
Basophils Absolute: 0 10*3/uL (ref 0.0–0.1)
EOS PCT: 4 % (ref 0–5)
Eosinophils Absolute: 0.3 10*3/uL (ref 0.0–0.7)
HCT: 25.7 % — ABNORMAL LOW (ref 39.0–52.0)
HEMOGLOBIN: 8.2 g/dL — AB (ref 13.0–17.0)
LYMPHS PCT: 15 % (ref 12–46)
Lymphs Abs: 1.3 10*3/uL (ref 0.7–4.0)
MCH: 22.3 pg — ABNORMAL LOW (ref 26.0–34.0)
MCHC: 31.9 g/dL (ref 30.0–36.0)
MCV: 69.8 fL — ABNORMAL LOW (ref 78.0–100.0)
Monocytes Absolute: 1.6 10*3/uL — ABNORMAL HIGH (ref 0.1–1.0)
Monocytes Relative: 19 % — ABNORMAL HIGH (ref 3–12)
Neutro Abs: 5.2 10*3/uL (ref 1.7–7.7)
Neutrophils Relative %: 62 % (ref 43–77)
Platelets: 650 10*3/uL — ABNORMAL HIGH (ref 150–400)
RBC: 3.68 MIL/uL — AB (ref 4.22–5.81)
RDW: 18.5 % — ABNORMAL HIGH (ref 11.5–15.5)
WBC: 8.4 10*3/uL (ref 4.0–10.5)

## 2013-07-02 LAB — BASIC METABOLIC PANEL
BUN: 10 mg/dL (ref 6–23)
CALCIUM: 9.2 mg/dL (ref 8.4–10.5)
CHLORIDE: 102 meq/L (ref 96–112)
CO2: 25 meq/L (ref 19–32)
Creatinine, Ser: 0.76 mg/dL (ref 0.50–1.35)
GFR calc Af Amer: >90 mL/min (ref >90)
GFR calc non Af Amer: >90 mL/min (ref >90)
Glucose, Bld: 92 mg/dL (ref 70–99)
POTASSIUM: 4.3 meq/L (ref 3.7–5.3)
SODIUM: 138 meq/L (ref 137–147)

## 2013-07-02 MED ORDER — HYDROCODONE-ACETAMINOPHEN 5-325 MG PO TABS: 1.0000 | ORAL_TABLET | ORAL | Status: DC | PRN

## 2013-07-02 MED ORDER — ONDANSETRON HCL 4 MG/2ML IJ SOLN
4.0000 mg | Freq: Once | INTRAMUSCULAR | Status: AC
  Administered 2013-07-02: 4 mg via INTRAVENOUS
  Filled 2013-07-02: qty 2

## 2013-07-02 MED ORDER — HYDROMORPHONE HCL PF 1 MG/ML IJ SOLN
1.0000 mg | Freq: Once | INTRAMUSCULAR | Status: AC
  Administered 2013-07-02: 1 mg via INTRAVENOUS
  Filled 2013-07-02: qty 1

## 2013-07-02 MED ORDER — PREDNISONE (PAK) 10 MG PO TABS: ORAL_TABLET | Freq: Every day | ORAL | Status: DC

## 2013-07-02 MED ORDER — SODIUM CHLORIDE 0.9 % IV BOLUS (SEPSIS)
1000.0000 mL | Freq: Once | INTRAVENOUS | Status: AC
Start: 2013-07-02 — End: 2013-07-02
  Administered 2013-07-02: 1000 mL via INTRAVENOUS

## 2013-07-02 NOTE — ED Notes (Signed)
Pt has hx of chrohns diease.

## 2013-07-02 NOTE — Discharge Instructions (Signed)
Read the information below.  Use the prescribed medication as directed.  Please discuss all new medications with your pharmacist.  Do not take additional tylenol while taking the prescribed pain medication to avoid overdose.  You may return to the Emergency Department at any time for worsening condition or any new symptoms that concern you.  If you develop high fevers, worsening abdominal pain, uncontrolled vomiting, or are unable to tolerate fluids by mouth, return to the ER for a recheck.     Abdominal Pain, Adult Many things can cause abdominal pain. Usually, abdominal pain is not caused by a disease and will improve without treatment. It can often be observed and treated at home. Your health care provider will do a physical exam and possibly order blood tests and X-rays to help determine the seriousness of your pain. However, in many cases, more time must pass before a clear cause of the pain can be found. Before that point, your health care provider may not know if you need more testing or further treatment. HOME CARE INSTRUCTIONS  Monitor your abdominal pain for any changes. The following actions may help to alleviate any discomfort you are experiencing:  Only take over-the-counter or prescription medicines as directed by your health care provider.  Do not take laxatives unless directed to do so by your health care provider.  Try a clear liquid diet (broth, tea, or water) as directed by your health care provider. Slowly move to a bland diet as tolerated. SEEK MEDICAL CARE IF:  You have unexplained abdominal pain.  You have abdominal pain associated with nausea or diarrhea.  You have pain when you urinate or have a bowel movement.  You experience abdominal pain that wakes you in the night.  You have abdominal pain that is worsened or improved by eating food.  You have abdominal pain that is worsened with eating fatty foods. SEEK IMMEDIATE MEDICAL CARE IF:   Your pain does not go away  within 2 hours.  You have a fever.  You keep throwing up (vomiting).  Your pain is felt only in portions of the abdomen, such as the right side or the left lower portion of the abdomen.  You pass bloody or black tarry stools. MAKE SURE YOU:  Understand these instructions.   Will watch your condition.   Will get help right away if you are not doing well or get worse.  Document Released: 01/20/2005 Document Revised: 01/31/2013 Document Reviewed: 12/20/2012 [facility] Patient Information 2014 [place].    Emergency Department Resource Guide 1) Find a Doctor and Pay Out of Pocket Although you won't have to find out who is covered by your insurance plan, it is a good idea to ask around and get recommendations. You will then need to call the office and see if the doctor you have chosen will accept you as a new patient and what types of options they offer for patients who are self-pay. Some doctors offer discounts or will set up payment plans for their patients who do not have insurance, but you will need to ask so you aren't surprised when you get to your appointment.  2) Contact Your Local Health Department Not all health departments have doctors that can see patients for sick visits, but many do, so it is worth a call to see if yours does. If you don't know where your local health department is, you can check in your phone book. The CDC also has a tool to help you locate your  state's health department, and many state websites also have listings of all of their local health departments.  3) Find a [facility] If your illness is not likely to be very severe or complicated, you may want to try a walk in clinic. These are popping up all over the country in pharmacies, drugstores, and shopping centers. They're usually staffed by nurse practitioners or physician assistants that have been trained to treat common illnesses and complaints. They're usually fairly quick and inexpensive.  However, if you have serious medical issues or chronic medical problems, these are probably not your best option.  No Primary Care Doctor: - Call Health Connect at  [phone] - they can help you locate a primary care doctor that  accepts your insurance, provides certain services, etc. - Physician Referral Service- [phone]  Chronic Pain Problems: Organization         Address  Phone   Notes  [facility]  [phone] Patients need to be referred by their primary care doctor.   Medication Assistance: Organization         Address  Phone   Notes  [facility] Medication [facility] [address] [place], [place] [address] [phone] --Must be a resident of [facility] -- Must have NO insurance coverage whatsoever (no Medicaid/ Medicare, etc.) -- The pt. MUST have a primary care doctor that directs their care regularly and follows them in the community   [facility]ist  [phone]   [employer]  [phone]    Agencies that provide inexpensive medical care: Organization         Address  Phone   Notes  [place]  [phone]   [name]    [phone]   [facility] [place], [place] [address] [phone]   [place] [address], [place] [phone]   Planned Parenthood    [phone]   [facility]    [phone]   [place] and [place] [address], [place] Phone:  [phone], Fax:  [phone] Hours of Operation:  9 am - 6 pm, M-F.  Also accepts Medicaid/Medicare and self-pay.  [facility] for [place], [address], [place] Phone: [phone], Fax: [phone]. Hours of Operation:  8:30 am - 5:30 pm, M-F.  Also accepts Medicaid and self-pay.  [facility] [place] [address] [place] Phone: [phone]   [place], [place], [place] [phone], Ext. [phone] Mondays & Thursdays: 7-9 AM.  First 15 patients are seen on a first come, first serve basis.    [place] Providers:  Organization         Address  Phone   Notes  [facility] [address], [place] [phone] Also accepts self-pay patients.  [facility] [address], [place]  [phone]   [place], [address], [place] [phone]   [facility] [address], [place] [phone]   Lucianne Lei [address], [place]   [phone] Only accepts [place] Access [place] patients after they have their name applied to their card.   Self-Pay (no insurance) in [facility]:  Organization  Address  Phone   Notes  Sickle Cell Patients, [facility] [place] [phone]   [facility] [place] [phone]   [name] [place]  [place], [address], [place] [phone]   [facility]/Dr. Osei-Bonsu  [address] [place] or [address], [place] [phone] Phone number for both [place] and [place] locations is the same.  Urgent Medical and [facility] [address] [place] [phone]   [facility] [address] [place] or [address] [phone]   [facility] [address] [place] [phone], phone; [phone], fax Sees patients 1st and 3rd Saturday of every month.  Must not qualify for public or private insurance (i.e. Medicaid, Medicare, [place] Health Choice, Veterans' Benefits)  Household income should be no more than 200% of the poverty level The clinic cannot treat you if you are pregnant or think  you are pregnant  Sexually transmitted diseases are not treated at the clinic.    Dental Care: Organization         Address  Phone  Notes  [facility] [place] [phone] Accepts children up to age 5 who are enrolled in [place] or [place]; pregnant women with a Medicaid card; and children who have applied for Medicaid or [place] Health Choice, but were declined, whose parents can pay a reduced fee at time of service.  [facility] Department of [facility]  [address], [place] [phone] Accepts children up to age 42 who are enrolled in [place] or [place]; pregnant women with a Medicaid card; and children who have applied for Medicaid or [place] Health Choice, but were declined, whose parents can pay a reduced fee at time of service.  [place] Adult Dental Access PROGRAM  [place] [phone] Patients are seen by appointment only. Walk-ins are not accepted. [place] will see patients 13 years of age and older. Monday - Tuesday (8am-5pm) Most Wednesdays (8:30-5pm) $30 per visit, cash only  [facility] Access [facility]  [address], [facility] [phone] Patients are seen by appointment only. Walk-ins are not accepted. [place] will see patients 71 years of age and older. One Wednesday Evening (Monthly: Volunteer Based).  $30 per visit, cash only  [place]  [phone] for adults; Children under age 34, call Graduate Pediatric Dentistry at [phone]. Children aged 33-14, please call [phone] to request a pediatric application.  Dental services are provided in all areas of dental care including fillings, crowns and bridges, complete and partial dentures, implants, gum treatment, root canals, and extractions. Preventive care is also provided. Treatment is provided to both adults and  children. Patients are selected via a lottery and there is often a waiting list.   [facility] [address], [place]  [phone] [email or web address]   [facility] [address] [place], [place] [phone], Ext. [phone] Second and Fourth Thursday of each month, opens at 6:30 AM; Clinic ends at 9 AM.  Patients are seen on a first-come first-served basis, and a limited number are seen during each clinic.   [facility]  [address] [place], [place]  [place], [place] [phone]   Eligibility Requirements You must have lived in [place], [place], or [place] counties for at least the last three months.   You cannot be eligible for state or federal sponsored [employer], including [employer], [place], or [employer].   You generally cannot be eligible for healthcare insurance through your employer.    How to apply: Eligibility screenings are held every Tuesday and Wednesday afternoon from 1:00 pm until 4:00 pm. You do not need an appointment for the interview!  [facility] [address] [place], [place]   [place]  [place] Department  [place]  [phone]    Behavioral Health Resources in the Community: Intensive Outpatient Programs Organization         Address  Phone  Notes  Brandon Asher. [address], [place], [place] [phone]   [facility] [address] [place], [place]   ADS: Alcohol & Drug Svcs [address] [place], [place]   [place] [address],  [place], [place] or [phone]   Substance Abuse Resources Organization         Address  Phone  Notes  Alcohol and Drug Services  [phone]   [place]  [phone]   [facility] [place]     Chinita Pester  [phone]   Residential & Outpatient Substance Abuse Program  [phone]   Psychological Services Organization         Address  Phone  Notes  [facility] [place]  [place]  [phone]   [place] [address] [place] or [phone]    Mobile Crisis Teams Organization         Address  Phone  Notes  [facility], Mobile Crisis Care Unit  [phone]   Assertive Psychotherapeutic Services  [address] [place], [place]   [place] Levels [address], [place] [phone]    Self-Help/Support Groups Organization         Address  Phone             Notes  [place]. of [place] - variety of support groups  [place] Call for more information  Narcotics Anonymous (NA), Caring [facility] [address], [employer] [place]  2 meetings at this location   Special educational needs teacher         Address  Phone  Notes  ASAP Residential Treatment [place],    [place]  [phone]   [facility]  [address], [place] [phone], [place], [place]   [place], [place] [phone] Admissions: 8am-3pm M-F  Incentives Substance [place] [address],    [place], [place] [phone]   [facility] [address] [place], [place], [place]   [facility] [address]  [place], [place]   [facility]ight Programs - Intensive Outpatient [address], [place], [place]   [facility] ([place].) [place].,  [place], [place] or [phone]   [facility] Treatment Services (RTS) [address], [place], [place] Accepts Medicaid  [facility] [place] [address],  [place] [phone] Substance Abuse/Addiction Treatment   [place]  Organization         Address  Phone  Notes  [facility]  [phone]   Domenic Schwab, PhD [address] [place], [place]   [phone] or [phone]   [place], [place] [phone]   [place] [address], [place], [place] [phone] Insurance/Medicaid/sponsorship through [facility] and Families [address]                                    [place], [place] [phone] [place] [address], [place] [phone]    Dr. Adele Schilder  [phone]   [facility] Dept. 1) [address], [place] 2) [place] 3)  [place] [address], [place] [phone]  [phone]   [place] [phone] or [phone] (After Hours)

## 2013-07-02 NOTE — ED Notes (Signed)
Per EMS- patient states he ran out of med for his Crohn's disease and began having abdominal pain yesterday. Patient denies any N/V/D.

## 2013-07-02 NOTE — ED Provider Notes (Signed)
CSN: [id number]     Arrival date & time 07/02/13  1743 History   First MD Initiated Contact with Patient 07/02/13 2050     Chief Complaint  Patient presents with  . Abdominal Pain     (Consider location/radiation/quality/duration/timing/severity/associated sxs/prior Treatment) HPI Pt with hx Crohn's disease presents with LLQ pain.  Pt was seen in ED last week for same, states he continued to have intermittent pain since then and then last night developed constant pain.  Pain is sharp.  Exacerbated with palpation.  No palliative factors.  Has had one soft bowel movement around 2am.  No blood in it.  Denies fevers, chills, body aches, N/V, urinary symptoms, testicular pain or swelling.    Past Medical History  Diagnosis Date  . 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. Crohn's disease with intestinal obstruction 03/01/2012    **Patient does not have celiac disease.  False-positive   . Iron deficiency anemia   . GERD (gastroesophageal reflux disease)    Past Surgical History  Procedure Laterality Date  . Esophagogastroduodenoscopy  Oct 2010    Dr. Fuller Plan: duodenitis, normal villi, likely Crohn's. Elevated Gliadin but normal TTG, IgA and IgA  . Colonoscopy  Oct 2010    Dr. Fuller Plan: evidence of Crohn's at cecum, ascending colon, unable to intubate the terminal ileum. CTE with distal and terminal ileitis  . Esophagogastroduodenoscopy N/A 04/24/2013    Procedure: ESOPHAGOGASTRODUODENOSCOPY (EGD);  Surgeon: Missy Sabins, MD;  [facility];  Service: Endoscopy;  Laterality: N/A;   Family History  Problem Relation Age of Onset  . Diabetes Maternal Aunt   . Diabetes Maternal Grandmother   . Colon cancer Neg Hx   . Crohn's disease Maternal Aunt    History  Substance Use Topics  . Smoking status: Former Smoker    Types: Cigarettes    Quit date: 01/24/2013  . Smokeless tobacco: Never Used     Comment: 2-3 cigarettes per day  . Alcohol Use: No     Comment: rarely    Review of Systems  Constitutional:  Negative for fever and chills.  Respiratory: Negative for cough and shortness of breath.   Cardiovascular: Negative for chest pain.  Gastrointestinal: Positive for abdominal pain. Negative for nausea, vomiting, diarrhea, constipation and blood in stool.  Genitourinary: Negative for dysuria, urgency, frequency, scrotal swelling and testicular pain.  Musculoskeletal: Negative for myalgias.      Allergies  Gluten meal and Wheat bran  Home Medications  No current outpatient prescriptions on file. BP 128/87  Pulse 93  Temp(Src) 99.2 F (37.3 C) (Oral)  Resp 18  SpO2 93% Physical Exam  Nursing note and vitals reviewed. Constitutional: He appears well-developed and well-nourished. No distress.  HENT:  Head: Normocephalic and atraumatic.  Neck: Neck supple.  Cardiovascular: Normal rate and regular rhythm.   Pulmonary/Chest: Effort normal and breath sounds normal. No respiratory distress. He has no wheezes. He has no rales.  Abdominal: Soft. He exhibits no distension and no mass. There is tenderness in the suprapubic area and left lower quadrant. There is no rigidity, no rebound and no guarding.  Neurological: He is alert. He exhibits normal muscle tone.  Skin: He is not diaphoretic.    ED Course  Procedures (including critical care time) Labs Review Labs Reviewed  CBC WITH DIFFERENTIAL - Abnormal; Notable for the following:    RBC 3.68 (*)    Hemoglobin 8.2 (*)    HCT 25.7 (*)    MCV 69.8 (*)    MCH 22.3 (*)  RDW 18.5 (*)    Platelets 650 (*)    Monocytes Relative 19 (*)    Monocytes Absolute 1.6 (*)    All other components within normal limits  BASIC METABOLIC PANEL  URINALYSIS, DIPSTICK ONLY   Imaging Review No results found.   EKG Interpretation None      10:09 PM Reexamination of abdomen remains unchanged.  10:27 PM  Pt states [facility] is on his medicaid card but he has not been able to schedule an appointment because they state they are not taking new  patient.s  I discussed possible follow up with [facility].  As patient has not been seen by them I am unable to set up an appointment for him.    MDM   Final diagnoses:  Abdominal pain  Crohn's disease    Afebrile, nontoxic patient with hx Crohn's disease p/w 2 weeks of LLQ pain.  WBC normal.  Chronic anemia is baseline for him.  Abdominal exam is benign.  Nonsurgical.  Pt has no PCP and no GI to follow up with.  I have discussed this patient with Dr Mingo Amber.  I believe he would benefit from a course of prednisone.  Serial abdominal exams remain benign, pt feeling better with pain medication.  D/C home with prednisone, norco, PCP resources, GI referral for follow up (has seen [facility] in the past).  Discussed result, findings, treatment, and follow up  with patient.  Pt given return precautions.  Pt verbalizes understanding and agrees with plan.        Clayton Bibles, PA-C 07/02/13 2352

## 2013-07-02 NOTE — ED Notes (Signed)
Pt c/o llq pain. Pt states pain started last night around 2000. Pt denies any n/v. Pt 1 loose stool. Pt has not taken anything for pain.

## 2013-07-03 NOTE — ED Provider Notes (Signed)
Medical screening examination/treatment/procedure(s) were performed by non-physician practitioner and as supervising physician I was immediately available for consultation/collaboration.   EKG Interpretation None        Osvaldo Shipper, MD 07/03/13 [phone]

## 2013-07-31 ENCOUNTER — Encounter (HOSPITAL_COMMUNITY): Payer: Self-pay | Admitting: Emergency Medicine

## 2013-07-31 ENCOUNTER — Emergency Department (HOSPITAL_COMMUNITY): Payer: Medicaid Other

## 2013-07-31 ENCOUNTER — Inpatient Hospital Stay (HOSPITAL_COMMUNITY)
Admission: EM | Admit: 2013-07-31 | Discharge: 2013-08-02 | DRG: 386 | Disposition: A | Discharge status: Home / Self Care | Payer: Medicaid Other | Attending: Internal Medicine | Admitting: Internal Medicine

## 2013-07-31 DIAGNOSIS — F17200 Nicotine dependence, unspecified, uncomplicated: Secondary | ICD-10-CM | POA: Diagnosis present

## 2013-07-31 DIAGNOSIS — D649 Anemia, unspecified: Secondary | ICD-10-CM

## 2013-07-31 DIAGNOSIS — K219 Gastro-esophageal reflux disease without esophagitis: Secondary | ICD-10-CM | POA: Diagnosis present

## 2013-07-31 DIAGNOSIS — K5010 Crohn's disease of large intestine without complications: Secondary | ICD-10-CM | POA: Insufficient documentation

## 2013-07-31 DIAGNOSIS — D509 Iron deficiency anemia, unspecified: Secondary | ICD-10-CM | POA: Diagnosis present

## 2013-07-31 DIAGNOSIS — K5090 Crohn's disease, unspecified, without complications: Secondary | ICD-10-CM

## 2013-07-31 DIAGNOSIS — K56609 Unspecified intestinal obstruction, unspecified as to partial versus complete obstruction: Secondary | ICD-10-CM | POA: Diagnosis present

## 2013-07-31 DIAGNOSIS — K50912 Crohn's disease, unspecified, with intestinal obstruction: Secondary | ICD-10-CM

## 2013-07-31 DIAGNOSIS — Z720 Tobacco use: Secondary | ICD-10-CM | POA: Diagnosis present

## 2013-07-31 DIAGNOSIS — K5000 Crohn's disease of small intestine without complications: Secondary | ICD-10-CM | POA: Diagnosis present

## 2013-07-31 DIAGNOSIS — K56600 Partial intestinal obstruction, unspecified as to cause: Secondary | ICD-10-CM | POA: Diagnosis present

## 2013-07-31 DIAGNOSIS — R197 Diarrhea, unspecified: Secondary | ICD-10-CM | POA: Diagnosis present

## 2013-07-31 DIAGNOSIS — K50012 Crohn's disease of small intestine with intestinal obstruction: Secondary | ICD-10-CM | POA: Diagnosis present

## 2013-07-31 HISTORY — DX: Gastrointestinal hemorrhage, unspecified: K92.2

## 2013-07-31 HISTORY — DX: Unspecified severe protein-calorie malnutrition: E43

## 2013-07-31 HISTORY — DX: Iron deficiency anemia, unspecified: D50.9

## 2013-07-31 LAB — CBC WITH DIFFERENTIAL/PLATELET
Basophils Absolute: 0.1 10*3/uL (ref 0.0–0.1)
Basophils Relative: 1 % (ref 0–1)
Eosinophils Absolute: 0.3 10*3/uL (ref 0.0–0.7)
Eosinophils Relative: 5 % (ref 0–5)
HCT: 26.8 % — ABNORMAL LOW (ref 39.0–52.0)
HEMOGLOBIN: 8.3 g/dL — AB (ref 13.0–17.0)
Lymphocytes Relative: 20 % (ref 12–46)
Lymphs Abs: 1.3 10*3/uL (ref 0.7–4.0)
MCH: 20.3 pg — AB (ref 26.0–34.0)
MCHC: 31 g/dL (ref 30.0–36.0)
MCV: 65.7 fL — ABNORMAL LOW (ref 78.0–100.0)
MONO ABS: 1.1 10*3/uL — AB (ref 0.1–1.0)
Monocytes Relative: 17 % — ABNORMAL HIGH (ref 3–12)
NEUTROS PCT: 57 % (ref 43–77)
Neutro Abs: 3.9 10*3/uL (ref 1.7–7.7)
Platelets: 568 10*3/uL — ABNORMAL HIGH (ref 150–400)
RBC: 4.08 MIL/uL — ABNORMAL LOW (ref 4.22–5.81)
RDW: 16.6 % — ABNORMAL HIGH (ref 11.5–15.5)
WBC: 6.7 10*3/uL (ref 4.0–10.5)

## 2013-07-31 LAB — COMPREHENSIVE METABOLIC PANEL
ALK PHOS: 62 U/L (ref 39–117)
ALT: 7 U/L (ref 0–53)
AST: 12 U/L (ref 0–37)
Albumin: 2.7 g/dL — ABNORMAL LOW (ref 3.5–5.2)
BUN: 12 mg/dL (ref 6–23)
CALCIUM: 8.9 mg/dL (ref 8.4–10.5)
CO2: 23 meq/L (ref 19–32)
Chloride: 103 mEq/L (ref 96–112)
Creatinine, Ser: 0.95 mg/dL (ref 0.50–1.35)
GFR calc non Af Amer: >90 mL/min (ref >90)
GLUCOSE: 76 mg/dL (ref 70–99)
POTASSIUM: 4.1 meq/L (ref 3.7–5.3)
Sodium: 137 mEq/L (ref 137–147)
Total Protein: 6.6 g/dL (ref 6.0–8.3)

## 2013-07-31 LAB — URINALYSIS, ROUTINE W REFLEX MICROSCOPIC
BILIRUBIN URINE: NEGATIVE
Glucose, UA: NEGATIVE mg/dL
HGB URINE DIPSTICK: NEGATIVE
KETONES UR: NEGATIVE mg/dL
Leukocytes, UA: NEGATIVE
Nitrite: NEGATIVE
PROTEIN: NEGATIVE mg/dL
Specific Gravity, Urine: 1.028 (ref 1.005–1.030)
Urobilinogen, UA: 0.2 mg/dL (ref 0.0–1.0)
pH: 5.5 (ref 5.0–8.0)

## 2013-07-31 LAB — TYPE AND SCREEN
ABO/RH(D): A POS
Antibody Screen: NEGATIVE

## 2013-07-31 LAB — LIPASE, BLOOD: Lipase: 16 U/L (ref 11–59)

## 2013-07-31 MED ORDER — HYDROMORPHONE HCL PF 1 MG/ML IJ SOLN
1.0000 mg | INTRAMUSCULAR | Status: DC | PRN
  Administered 2013-07-31 – 2013-08-02 (×5): 1 mg via INTRAVENOUS
  Filled 2013-07-31 (×5): qty 1

## 2013-07-31 MED ORDER — METHYLPREDNISOLONE SODIUM SUCC 125 MG IJ SOLR
60.0000 mg | Freq: Once | INTRAMUSCULAR | Status: AC
  Administered 2013-07-31: 60 mg via INTRAVENOUS
  Filled 2013-07-31: qty 2

## 2013-07-31 MED ORDER — SODIUM CHLORIDE 0.9 % IV BOLUS (SEPSIS)
1000.0000 mL | Freq: Once | INTRAVENOUS | Status: AC
  Administered 2013-07-31: 1000 mL via INTRAVENOUS

## 2013-07-31 MED ORDER — METHYLPREDNISOLONE SODIUM SUCC 125 MG IJ SOLR
60.0000 mg | Freq: Two times a day (BID) | INTRAMUSCULAR | Status: DC
  Administered 2013-08-01 – 2013-08-02 (×3): 60 mg via INTRAVENOUS
  Filled 2013-07-31 (×5): qty 0.96

## 2013-07-31 MED ORDER — HYDROMORPHONE HCL PF 1 MG/ML IJ SOLN
1.0000 mg | Freq: Once | INTRAMUSCULAR | Status: AC
Start: 2013-07-31 — End: 2013-07-31
  Administered 2013-07-31: 1 mg via INTRAVENOUS
  Filled 2013-07-31: qty 1

## 2013-07-31 MED ORDER — HYDROMORPHONE HCL PF 1 MG/ML IJ SOLN
1.0000 mg | Freq: Once | INTRAMUSCULAR | Status: AC
  Administered 2013-07-31: 1 mg via INTRAVENOUS
  Filled 2013-07-31: qty 1

## 2013-07-31 MED ORDER — IOHEXOL 300 MG/ML  SOLN
50.0000 mL | Freq: Once | INTRAMUSCULAR | Status: AC | PRN
  Administered 2013-07-31: 50 mL via ORAL

## 2013-07-31 MED ORDER — ACETAMINOPHEN 325 MG PO TABS: 650.0000 mg | ORAL_TABLET | Freq: Four times a day (QID) | ORAL | Status: DC | PRN

## 2013-07-31 MED ORDER — IOHEXOL 300 MG/ML  SOLN
100.0000 mL | Freq: Once | INTRAMUSCULAR | Status: AC | PRN
  Administered 2013-07-31: 100 mL via INTRAVENOUS

## 2013-07-31 MED ORDER — ONDANSETRON HCL 4 MG/2ML IJ SOLN
4.0000 mg | Freq: Once | INTRAMUSCULAR | Status: AC
  Administered 2013-07-31: 4 mg via INTRAMUSCULAR
  Filled 2013-07-31: qty 2

## 2013-07-31 MED ORDER — ALBUTEROL SULFATE (2.5 MG/3ML) 0.083% IN NEBU: 2.5000 mg | INHALATION_SOLUTION | RESPIRATORY_TRACT | Status: DC | PRN

## 2013-07-31 MED ORDER — ONDANSETRON HCL 4 MG/2ML IJ SOLN: 4.0000 mg | Freq: Four times a day (QID) | INTRAMUSCULAR | Status: DC | PRN

## 2013-07-31 MED ORDER — ACETAMINOPHEN 650 MG RE SUPP: 650.0000 mg | Freq: Four times a day (QID) | RECTAL | Status: DC | PRN

## 2013-07-31 MED ORDER — PANTOPRAZOLE SODIUM 40 MG IV SOLR
40.0000 mg | INTRAVENOUS | Status: DC
  Administered 2013-07-31 – 2013-08-01 (×2): 40 mg via INTRAVENOUS
  Filled 2013-07-31 (×3): qty 40

## 2013-07-31 MED ORDER — POTASSIUM CHLORIDE IN NACL 20-0.9 MEQ/L-% IV SOLN
INTRAVENOUS | Status: DC
  Administered 2013-07-31 – 2013-08-01 (×3): via INTRAVENOUS
  Administered 2013-08-02: 20 mL/h via INTRAVENOUS
  Filled 2013-07-31 (×4): qty 1000

## 2013-07-31 MED ORDER — ONDANSETRON HCL 4 MG PO TABS: 4.0000 mg | ORAL_TABLET | Freq: Four times a day (QID) | ORAL | Status: DC | PRN

## 2013-07-31 NOTE — ED Notes (Signed)
MD at bedside. Hospitalist at bedside. 

## 2013-07-31 NOTE — ED Provider Notes (Signed)
CSN: [id number]     Arrival date & time 07/31/13  1317 History   First MD Initiated Contact with Patient 07/31/13 1546     Chief Complaint  Patient presents with  . Abdominal Pain  . Diarrhea     (Consider location/radiation/quality/duration/timing/severity/associated sxs/prior Treatment) HPI.... left-sided abdominal pain for several days. Patient has a history of Crohn's disease but no primary care Dr. Juluis Pitch been unable to keep fluids down.   Some diarrhea. No nausea or vomiting. No fever or chills. Has taken nothing at home. Severity is moderate. No radiation of pain.  Past Medical History  Diagnosis Date  . 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. Crohn's disease with intestinal obstruction 03/01/2012    **Patient does not have celiac disease.  False-positive   . Iron deficiency anemia   . GERD (gastroesophageal reflux disease)    Past Surgical History  Procedure Laterality Date  . Esophagogastroduodenoscopy  Oct 2010    Dr. Fuller Plan: duodenitis, normal villi, likely Crohn's. Elevated Gliadin but normal TTG, IgA and IgA  . Colonoscopy  Oct 2010    Dr. Fuller Plan: evidence of Crohn's at cecum, ascending colon, unable to intubate the terminal ileum. CTE with distal and terminal ileitis  . Esophagogastroduodenoscopy N/A 04/24/2013    Procedure: ESOPHAGOGASTRODUODENOSCOPY (EGD);  Surgeon: Missy Sabins, MD;  Location: [facility] ENDOSCOPY;  Service: Endoscopy;  Laterality: N/A;   Family History  Problem Relation Age of Onset  . Diabetes Maternal Aunt   . Diabetes Maternal Grandmother   . Colon cancer Neg Hx   . Crohn's disease Maternal Aunt    History  Substance Use Topics  . Smoking status: Former Smoker    Types: Cigarettes    Quit date: 01/24/2013  . Smokeless tobacco: Never Used     Comment: 2-3 cigarettes per day  . Alcohol Use: No     Comment: rarely    Review of Systems  All other systems reviewed and are negative.      Allergies  Gluten meal and Wheat bran  Home Medications  No current  outpatient prescriptions on file. BP 123/64  Pulse 90  Temp(Src) 98.7 F (37.1 C) (Oral)  Resp 18  SpO2 100% Physical Exam  Nursing note and vitals reviewed. Constitutional: He is oriented to person, place, and time. He appears well-developed and well-nourished.  HENT:  Head: Normocephalic and atraumatic.  Eyes: Conjunctivae and EOM are normal. Pupils are equal, round, and reactive to light.  Neck: Normal range of motion. Neck supple.  Cardiovascular: Normal rate, regular rhythm and normal heart sounds.   Pulmonary/Chest: Effort normal and breath sounds normal.  Abdominal: Soft. Bowel sounds are normal.  Left-sided tenderness  Musculoskeletal: Normal range of motion.  Neurological: He is alert and oriented to person, place, and time.  Skin: Skin is warm and dry.  Psychiatric: He has a normal mood and affect. His behavior is normal.    ED Course  Procedures (including critical care time) Labs Review Labs Reviewed  CBC WITH DIFFERENTIAL - Abnormal; Notable for the following:    RBC 4.08 (*)    Hemoglobin 8.3 (*)    HCT 26.8 (*)    MCV 65.7 (*)    MCH 20.3 (*)    RDW 16.6 (*)    Platelets 568 (*)    Monocytes Relative 17 (*)    Monocytes Absolute 1.1 (*)    All other components within normal limits  COMPREHENSIVE METABOLIC PANEL - Abnormal; Notable for the following:    Albumin 2.7 (*)  Total Bilirubin <0.2 (*)    All other components within normal limits  LIPASE, BLOOD  URINALYSIS, ROUTINE W REFLEX MICROSCOPIC   Imaging Review Ct Abdomen Pelvis W Contrast  07/31/2013   CLINICAL DATA:  Left-sided abdominal pain with nausea, vomiting, and diarrhea. History of Crohn's disease.  EXAM: CT ABDOMEN AND PELVIS WITH CONTRAST  TECHNIQUE: Multidetector CT imaging of the abdomen and pelvis was performed using the standard protocol following bolus administration of intravenous contrast.  CONTRAST:  135mL OMNIPAQUE IOHEXOL 300 MG/ML  SOLN  COMPARISON:  Radiographs dated 06/21/2013  and CT scan dated 04/13/2013  FINDINGS: There is marked dilatation of the distal ileum in an area of abnormal soft tissue at the terminal ileum and ileocecal valve. There is thickening of the wall of the terminal ileum. There is a second dilated loop of small bowel in the right mid abdomen which extends into the same area and appears partially obstructed.  Liver, spleen, pancreas, adrenal glands, biliary tree, and kidneys are normal. There is a tiny amount of free fluid in the pelvis. No osseous abnormality. No free air. Lung bases are clear.  IMPRESSION: Inflammatory process in the right lower quadrant at the ileocecal valve consistent with Crohn's disease with 2 areas of partial small bowel obstruction arising in the same area. The appearance is similar to that seen on the prior exam.   Electronically Signed   By: Rozetta Nunnery M.D.   On: 07/31/2013 18:47     EKG Interpretation None      MDM   Final diagnoses:  Crohn's disease    CT scan shows inflammation at the ileocecal valve along with 2 areas of partial small bowel obstruction. IV fluids, pain management, admit to general medicine.    Nat Christen, MD 07/31/13 2044

## 2013-07-31 NOTE — ED Notes (Signed)
Pt sts he will be able to void a couple of mins

## 2013-07-31 NOTE — Progress Notes (Signed)
Utilization Review completed.  Twinkle Sockwell RN CM  

## 2013-07-31 NOTE — H&P (Signed)
[facility] History and Physical  Shane Klein KGM:[id number] DOB: [birth year] DOA: 07/31/2013  Referring physician: ED physician, Dr. Lacinda Axon PCP: [facility] pending   Chief Complaint: Abdominal pain and diarrhea.  HPI: Shane Klein is a 23 y.o. male with a history of Crohn's disease, diagnosed in 2011, duodenitis, and iron deficiency anemia, who presents with a three-day history of lower abdominal pain and diarrhea. The pain is located at the left greater than right lower quadrants. It has been sharp and persistent. Nothing improves or decreases the pain. It has been associated with loose stools-diarrhea. He's had at least 4 or 5 loose stools over the past couple days. He has only had 2 loose stools today. He denies bright red blood per or black tarry stools. He denies nausea or vomiting. He does have a poor appetite and has eaten and drunk very little over the past couple days. He has had an increase in fatigue. He has had chills but no subjective fever.  In the emergency department, he is afebrile and hemodynamically stable. His lab data are significant for a hemoglobin of 8.3 and platelet count of 568. His urinalysis is unremarkable. CT of his abdomen and pelvis reveals inflammatory process in the right lower quadrant at the ileocecal valve consistent with Crohn's disease and with 2 areas of partial small bowel obstruction arising in the same area. He is being admitted for further evaluation and management.    Review of Systems:  As above in history present illness. In addition, he has occasional swelling in his ankles. Otherwise review of systems is negative.     Past Medical History  Diagnosis Date  . 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. Crohn's disease with intestinal obstruction 03/01/2012    **Patient does not have celiac disease.  False-positive   . Iron deficiency anemia   . GERD (gastroesophageal reflux disease)   . ANEMIA-IRON DEFICIENCY 04/02/2009    Qualifier: Diagnosis of   By: Fuller Plan MD Lamont Snowball T   . DUODENITIS 04/02/2009    Qualifier: Diagnosis of  By: Fuller Plan MD Marijo Conception Protein-calorie malnutrition, severe 04/15/2013  . GI bleed 04/22/2013    EGD unremarkable   Past Surgical History  Procedure Laterality Date  . Esophagogastroduodenoscopy  Oct 2010    Dr. Fuller Plan: duodenitis, normal villi, likely Crohn's. Elevated Gliadin but normal TTG, IgA and IgA  . Colonoscopy  Oct 2010    Dr. Fuller Plan: evidence of Crohn's at cecum, ascending colon, unable to intubate the terminal ileum. CTE with distal and terminal ileitis  . Esophagogastroduodenoscopy N/A 04/24/2013    Procedure: ESOPHAGOGASTRODUODENOSCOPY (EGD);  Surgeon: Missy Sabins, MD;  Location: [facility] ENDOSCOPY;  Service: Endoscopy;  Laterality: N/A;   Social History: He is single. He has 3 children. He is unemployed. He stopped smoking 6 months ago but restarted and smokes approximately 4-5 cigarettes daily. He smokes marijuana on occasion. He denies any other illicit drug use. He drinks alcohol on occasion.   Allergies  Allergen Reactions  . Gluten Meal Other (See Comments)    Stomach pain  . Wheat Bran Other (See Comments)    Belly pain    Family History  Problem Relation Age of Onset  . Diabetes Maternal Aunt   . Diabetes Maternal Grandmother   . Colon cancer Neg Hx   . Crohn's disease Maternal Aunt   His mother has hypertension and a lung abscess. His father has a history of CHF.    Prior to Admission medications  Not on File   Physical Exam: Filed Vitals:   07/31/13 1920  BP: 123/64  Pulse: 90  Temp: 98.7 F (37.1 C)  Resp: 18    BP 123/64  Pulse 90  Temp(Src) 98.7 F (37.1 C) (Oral)  Resp 18  SpO2 100%  General:  Pleasant 23 year old in no acute distress, but he appears ill. Eyes: PERRL, normal lids, irises & conjunctiva ENT: grossly normal hearing; dry mucous membranes of his oral pharynx without exudates or erythema. Neck: no LAD, masses or  thyromegaly Cardiovascular: RRR, no m/r/g. No LE edema. Telemetry: Not applicable  Respiratory: CTA bilaterally, no w/r/r. Normal respiratory effort. Abdomen: Positive bowel sounds, soft, mildly tender at the right lower quadrant and left lower quadrant without masses palpated. No rigidity or significant distention. Skin: He has tattoos on his chest and right upper extremity; no rash or induration seen on limited exam Musculoskeletal: grossly normal tone BUE/BLE Psychiatric: grossly normal mood and affect, speech fluent and appropriate Neurologic: grossly non-focal; cranial nerves 2-12 intact           Labs on Admission:  Basic Metabolic Panel:  Recent Labs Lab 07/31/13 1338  NA 137  K 4.1  CL 103  CO2 23  GLUCOSE 76  BUN 12  CREATININE 0.95  CALCIUM 8.9   Liver Function Tests:  Recent Labs Lab 07/31/13 1338  AST 12  ALT 7  ALKPHOS 62  BILITOT <0.2*  PROT 6.6  ALBUMIN 2.7*    Recent Labs Lab 07/31/13 1338  LIPASE 16   No results found for this basename: AMMONIA,  in the last 168 hours CBC:  Recent Labs Lab 07/31/13 1338  WBC 6.7  NEUTROABS 3.9  HGB 8.3*  HCT 26.8*  MCV 65.7*  PLT 568*   Cardiac Enzymes: No results found for this basename: CKTOTAL, CKMB, CKMBINDEX, TROPONINI,  in the last 168 hours  BNP (last 3 results)  Recent Labs  04/22/13 1903  PROBNP 327.1*   CBG: No results found for this basename: GLUCAP,  in the last 168 hours  Radiological Exams on Admission: Ct Abdomen Pelvis W Contrast  07/31/2013   CLINICAL DATA:  Left-sided abdominal pain with nausea, vomiting, and diarrhea. History of Crohn's disease.  EXAM: CT ABDOMEN AND PELVIS WITH CONTRAST  TECHNIQUE: Multidetector CT imaging of the abdomen and pelvis was performed using the standard protocol following bolus administration of intravenous contrast.  CONTRAST:  142mL OMNIPAQUE IOHEXOL 300 MG/ML  SOLN  COMPARISON:  Radiographs dated 06/21/2013 and CT scan dated 04/13/2013   FINDINGS: There is marked dilatation of the distal ileum in an area of abnormal soft tissue at the terminal ileum and ileocecal valve. There is thickening of the wall of the terminal ileum. There is a second dilated loop of small bowel in the right mid abdomen which extends into the same area and appears partially obstructed.  Liver, spleen, pancreas, adrenal glands, biliary tree, and kidneys are normal. There is a tiny amount of free fluid in the pelvis. No osseous abnormality. No free air. Lung bases are clear.  IMPRESSION: Inflammatory process in the right lower quadrant at the ileocecal valve consistent with Crohn's disease with 2 areas of partial small bowel obstruction arising in the same area. The appearance is similar to that seen on the prior exam.   Electronically Signed   By: Rozetta Nunnery M.D.   On: 07/31/2013 18:47    EKG: Independently reviewed. Not ordered  Assessment/Plan Principal Problem:   Crohn's ileitis Active Problems:  Partial small bowel obstruction   Diarrhea   ANEMIA-IRON DEFICIENCY   Tobacco use   1. Abdominal pain and diarrhea, likely secondary to exacerbation of Crohn's ileitis and partial small bowel obstruction. The patient has been lost to followup due to insurance issues. He now has Medicaid. In the past, he has seen gastroenterologists in both [place] and in [place]. He now lives in [place]. He was given 1 dose of IV Solu-Medrol in the emergency department. We'll continue every 12 hour dosing of Solu-Medrol. Will hold on antibiotics for now as he is afebrile and his white blood cell count is within normal limits. There is a low threshold for starting empiric antibiotics however. Bowel rest; allow ice chips and sips. He has no nausea or vomiting so we'll not inserted an NG tube. Will order as needed Zofran. We'll treat his pain with as needed Dilaudid. We'll start IV Protonix empirically. We'll provide IV fluid hydration. We'll order C. difficile PCR to rule  out infection. Consider gastroenterology consultation tomorrow. If his partial small bowel obstruction does not resolve with supportive treatment, consider general surgery consultation. We'll order an abdominal x-ray in the morning. 2. Iron deficiency anemia. The patient has a history of duodenitis and GERD. During his previous hospitalization in 2014, he had to be transfused 2 units of packed red blood cells for hemoglobin of less than 7. The EGD at that time was unremarkable. His hemoglobin is 8.3 today, but I expect it to decrease with IV fluid hydration. Will type and screen for packed red blood cells. Will order an anemia panel for tomorrow morning. Monitor for gross GI bleeding. 3. Tobacco and marijuana use. The patient was advised to stop both.   Code Status: Full code Family Communication: Discussed with his girlfriend, per permission. Disposition Plan: To be determined.  Time spent: One hour.  [facility] Pager [phone]

## 2013-07-31 NOTE — ED Notes (Signed)
Pt unable to void at this time. Given IV fluids. Will acess further later.

## 2013-07-31 NOTE — ED Notes (Signed)
Pt reports hx of chrons disease. Abdominal pain 10/10 since Friday morning. Has had diarrhea x1 today. Denies n/v.

## 2013-08-01 ENCOUNTER — Inpatient Hospital Stay (HOSPITAL_COMMUNITY): Payer: Medicaid Other

## 2013-08-01 DIAGNOSIS — K5090 Crohn's disease, unspecified, without complications: Secondary | ICD-10-CM

## 2013-08-01 DIAGNOSIS — D509 Iron deficiency anemia, unspecified: Secondary | ICD-10-CM

## 2013-08-01 LAB — CBC
HCT: 25.5 % — ABNORMAL LOW (ref 39.0–52.0)
Hemoglobin: 7.8 g/dL — ABNORMAL LOW (ref 13.0–17.0)
MCH: 20.1 pg — ABNORMAL LOW (ref 26.0–34.0)
MCHC: 30.6 g/dL (ref 30.0–36.0)
MCV: 65.7 fL — ABNORMAL LOW (ref 78.0–100.0)
PLATELETS: 496 10*3/uL — AB (ref 150–400)
RBC: 3.88 MIL/uL — AB (ref 4.22–5.81)
RDW: 16.8 % — ABNORMAL HIGH (ref 11.5–15.5)
WBC: 5.5 10*3/uL (ref 4.0–10.5)

## 2013-08-01 LAB — COMPREHENSIVE METABOLIC PANEL
ALK PHOS: 56 U/L (ref 39–117)
ALT: 7 U/L (ref 0–53)
AST: 11 U/L (ref 0–37)
Albumin: 2.4 g/dL — ABNORMAL LOW (ref 3.5–5.2)
BUN: 8 mg/dL (ref 6–23)
CHLORIDE: 106 meq/L (ref 96–112)
CO2: 24 meq/L (ref 19–32)
CREATININE: 0.81 mg/dL (ref 0.50–1.35)
Calcium: 8.8 mg/dL (ref 8.4–10.5)
GFR calc Af Amer: >90 mL/min (ref >90)
Glucose, Bld: 117 mg/dL — ABNORMAL HIGH (ref 70–99)
Potassium: 4.9 mEq/L (ref 3.7–5.3)
Sodium: 139 mEq/L (ref 137–147)
Total Protein: 6 g/dL (ref 6.0–8.3)

## 2013-08-01 LAB — IRON AND TIBC: UIBC: 293 ug/dL (ref 125–400)

## 2013-08-01 LAB — VITAMIN B12: Vitamin B-12: 308 pg/mL (ref 211–911)

## 2013-08-01 LAB — RETICULOCYTES
RBC.: 3.88 MIL/uL — ABNORMAL LOW (ref 4.22–5.81)
RETIC CT PCT: 1 % (ref 0.4–3.1)
Retic Count, Absolute: 38.8 10*3/uL (ref 19.0–186.0)

## 2013-08-01 LAB — CLOSTRIDIUM DIFFICILE BY PCR: CDIFFPCR: NEGATIVE

## 2013-08-01 LAB — FERRITIN: Ferritin: 12 ng/mL — ABNORMAL LOW (ref 22–322)

## 2013-08-01 LAB — TSH: TSH: 0.681 u[IU]/mL (ref 0.350–4.500)

## 2013-08-01 LAB — FOLATE: Folate: 10.5 ng/mL

## 2013-08-01 MED ORDER — ADULT MULTIVITAMIN W/MINERALS CH
1.0000 | ORAL_TABLET | Freq: Every day | ORAL | Status: DC
  Administered 2013-08-01 – 2013-08-02 (×2): 1 via ORAL
  Filled 2013-08-01 (×2): qty 1

## 2013-08-01 NOTE — Progress Notes (Signed)
[facility] PROGRESS NOTE  KNOX CERVI IOM:[id number] DOB: [birth year] DOA: 07/31/2013  PCP: No PCP Per Patient. Waiting to go to [facility]  Brief HPI: Shane Klein is a 23 y.o. male with a history of Crohn's disease, diagnosed in 2011, duodenitis, and iron deficiency anemia, who presented with a three-day history of lower abdominal pain and diarrhea.   Past medical history:  Past Medical History  Diagnosis Date  . 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. Crohn's disease with intestinal obstruction 03/01/2012    **Patient does not have celiac disease.  False-positive   . Iron deficiency anemia   . GERD (gastroesophageal reflux disease)   . ANEMIA-IRON DEFICIENCY 04/02/2009    Qualifier: Diagnosis of  By: Fuller Plan MD Lamont Snowball T   . DUODENITIS 04/02/2009    Qualifier: Diagnosis of  By: Fuller Plan MD Marijo Conception Protein-calorie malnutrition, severe 04/15/2013  . GI bleed 04/22/2013    EGD unremarkable    Consultants: None  Procedures: None  Antibiotics: None  Subjective: Patient feels better this AM. Had a small BM this morning. Denies any pain. No nausea or vomiting.  Objective: Vital Signs  Filed Vitals:   07/31/13 1339 07/31/13 1920 07/31/13 2141 08/01/13 0630  BP: 140/85 123/64 99/61 95/65   Pulse: 114 90 82 58  Temp: 98.9 F (37.2 C) 98.7 F (37.1 C) 97.9 F (36.6 C) 98 F (36.7 C)  TempSrc: Oral Oral Oral Oral  Resp: 19 18 20 18   Height:   5' 11"  (1.803 m)   Weight:   75.8 kg (167 lb 1.7 oz)   SpO2: 97% 100% 97% 99%    Intake/Output Summary (Last 24 hours) at 08/01/13 1031 Last data filed at 07/31/13 1947  Gross per 24 hour  Intake      0 ml  Output    350 ml  Net   -350 ml   Filed Weights   07/31/13 2141  Weight: 75.8 kg (167 lb 1.7 oz)    General appearance: alert, cooperative, appears stated age and no distress Resp: clear to auscultation bilaterally Cardio: regular rate and rhythm, S1, S2 normal, no murmur, click, rub or gallop GI: soft,  non-tender; bowel sounds normal; no masses,  no organomegaly Neurologic: No focal deficits  Lab Results:  Basic Metabolic Panel:  Recent Labs Lab 07/31/13 1338 08/01/13 0513  NA 137 139  K 4.1 4.9  CL 103 106  CO2 23 24  GLUCOSE 76 117*  BUN 12 8  CREATININE 0.95 0.81  CALCIUM 8.9 8.8   Liver Function Tests:  Recent Labs Lab 07/31/13 1338 08/01/13 0513  AST 12 11  ALT 7 7  ALKPHOS 62 56  BILITOT <0.2* <0.2*  PROT 6.6 6.0  ALBUMIN 2.7* 2.4*    Recent Labs Lab 07/31/13 1338  LIPASE 16   CBC:  Recent Labs Lab 07/31/13 1338 08/01/13 0513  WBC 6.7 5.5  NEUTROABS 3.9  --   HGB 8.3* 7.8*  HCT 26.8* 25.5*  MCV 65.7* 65.7*  PLT 568* 496*    Studies/Results: Ct Abdomen Pelvis W Contrast  07/31/2013   CLINICAL DATA:  Left-sided abdominal pain with nausea, vomiting, and diarrhea. History of Crohn's disease.  EXAM: CT ABDOMEN AND PELVIS WITH CONTRAST  TECHNIQUE: Multidetector CT imaging of the abdomen and pelvis was performed using the standard protocol following bolus administration of intravenous contrast.  CONTRAST:  138m OMNIPAQUE IOHEXOL 300 MG/ML  SOLN  COMPARISON:  Radiographs dated 06/21/2013 and CT scan dated  04/13/2013  FINDINGS: There is marked dilatation of the distal ileum in an area of abnormal soft tissue at the terminal ileum and ileocecal valve. There is thickening of the wall of the terminal ileum. There is a second dilated loop of small bowel in the right mid abdomen which extends into the same area and appears partially obstructed.  Liver, spleen, pancreas, adrenal glands, biliary tree, and kidneys are normal. There is a tiny amount of free fluid in the pelvis. No osseous abnormality. No free air. Lung bases are clear.  IMPRESSION: Inflammatory process in the right lower quadrant at the ileocecal valve consistent with Crohn's disease with 2 areas of partial small bowel obstruction arising in the same area. The appearance is similar to that seen on the  prior exam.   Electronically Signed   By: Rozetta Nunnery M.D.   On: 07/31/2013 18:47    Medications:  Scheduled: . methylPREDNISolone (SOLU-MEDROL) injection  60 mg Intravenous Q12H  . multivitamin with minerals  1 tablet Oral Daily  . pantoprazole (PROTONIX) IV  40 mg Intravenous Q24H   Continuous: . 0.9 % NaCl with KCl 20 mEq / L 100 mL/hr at 08/01/13 0809   VUD:[id number], acetaminophen, albuterol, HYDROmorphone (DILAUDID) injection, ondansetron (ZOFRAN) IV, ondansetron  Assessment/Plan:  Principal Problem:   Crohn's ileitis Active Problems:   ANEMIA-IRON DEFICIENCY   Partial small bowel obstruction   Diarrhea   Tobacco use    Abdominal pain and diarrhea, likely secondary to exacerbation of Crohn's ileitis and partial small bowel obstruction.  The patient has been lost to followup due to insurance issues. He now has Medicaid and plans to see a provider at [facility] to begin with. His symptoms have improved this morning. Repeat AXR shows no worsening of SBO and actually the contrast has passed through to the colon. Will advance diet. Continue steroids. In the past, he has seen gastroenterologists in both [place] and in [place]. He now lives in [place].   Iron deficiency anemia.  The patient has a history of duodenitis and GERD. During his previous hospitalization in 2014, he had to be transfused 2 units of packed red blood cells for hemoglobin of less than 7. The EGD at that time was unremarkable. His hemoglobin has dropped some with hydration. No clear indication to transfuse as yet. Will do so if Hgb drops below 7. No overt bleeding.   Tobacco and marijuana use The patient was advised to stop both.  Code Status: Full code  DVT Prophylaxis: SCD's    Family Communication: Discussed with patient and fiancee  Disposition Plan: Anticipate discharge in AM if continues to improve.    LOS: 1 day   [facility] Pager  [phone] 08/01/2013, 10:31 AM  If 8PM-8AM, please contact night-coverage at [email or web address], password [facility]

## 2013-08-01 NOTE — Progress Notes (Signed)
INITIAL NUTRITION ASSESSMENT  DOCUMENTATION CODES Per approved criteria  -Not Applicable   INTERVENTION: - Diet advancement to gluten free diet per MD - Multivitamin 1 tablet PO daily - Will continue to monitor   NUTRITION DIAGNOSIS: Inadequate oral intake related to clear liquid diet as evidenced by diet order.    Goal: Advance diet as tolerated to gluten free diet  Monitor:  Weights, labs, diet advancement  Reason for Assessment: Malnutrition screening tool   23 y.o. male  Admitting Dx: Crohn's ileitis  ASSESSMENT: Hx of Crohn's disease, duodenitis, and iron deficiency anemia admitted with 3 day history of lower abdominal pain and diarrhea. Reports allergy to gluten and wheat however pt does not have Celiac disease per past medical history, it was false positive.   Met with pt who reports eating great PTA, 3 meals/day. States he has been avoiding gluten and wheat for the past 1.5 years. When he eats it, he gets stomach pain and constipation. When asked what he consumed prior to admission that he thinks may have contributed to abdominal pain, pt stated, "Yeah I messed up, I had alcohol before I came in. I had 6 shots of brown liquor, 1 shot of white liquor, it probably contained gluten in it. I knew I shouldn't have drank it, but I was big headed". Denies frequent alcohol use, states this was the first time he has consumed this much. Denies any abdominal pain or diarrhea today. Discussed pt's alcohol use with RN who reports pt also told her that drinking alcohol causes him to have abdominal pain. RN notified MD of pt's reported alcohol intake PTA. Pt denies any gluten educational needs, is aware that most alcohol contains gluten.   Height: Ht Readings from Last 1 Encounters:  07/31/13 $RemoveB'5\' 11"'AdaQzxef$  (1.803 m)    Weight: Wt Readings from Last 1 Encounters:  07/31/13 167 lb 1.7 oz (75.8 kg)    Ideal Body Weight: 172 lbs   % Ideal Body Weight: 97%  Wt Readings from Last 10  Encounters:  07/31/13 167 lb 1.7 oz (75.8 kg)  04/23/13 180 lb 14.4 oz (82.056 kg)  04/23/13 180 lb 14.4 oz (82.056 kg)  04/14/13 138 lb 14.2 oz (63 kg)  11/20/12 159 lb 9.6 oz (72.394 kg)  11/15/12 170 lb (77.111 kg)  11/13/12 170 lb (77.111 kg)  10/07/12 170 lb (77.111 kg)  03/01/12 174 lb 12.8 oz (79.289 kg)  05/16/09 174 lb 12.8 oz (79.289 kg) (80%*, Z = 0.85)   * Growth percentiles are based on CDC 2-20 Years data.    Usual Body Weight: 167 lbs per pt  % Usual Body Weight: 100%  BMI:  Body mass index is 23.32 kg/(m^2).  Estimated Nutritional Needs: Kcal: 1900-2100 Protein: 90-110g Fluid: 1.9-2.1L/day  Skin: Intact   Diet Order: Clear Liquid  EDUCATION NEEDS: -No education needs identified at this time   Intake/Output Summary (Last 24 hours) at 08/01/13 0928 Last data filed at 07/31/13 1947  Gross per 24 hour  Intake      0 ml  Output    350 ml  Net   -350 ml    Last BM: 4/8  Labs:   Recent Labs Lab 07/31/13 1338 08/01/13 0513  NA 137 139  K 4.1 4.9  CL 103 106  CO2 23 24  BUN 12 8  CREATININE 0.95 0.81  CALCIUM 8.9 8.8  GLUCOSE 76 117*    CBG (last 3)  No results found for this basename: GLUCAP,  in the  last 72 hours  Scheduled Meds: . methylPREDNISolone (SOLU-MEDROL) injection  60 mg Intravenous Q12H  . pantoprazole (PROTONIX) IV  40 mg Intravenous Q24H    Continuous Infusions: . 0.9 % NaCl with KCl 20 mEq / L 100 mL/hr at 08/01/13 4730    Past Medical History  Diagnosis Date  . 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. Crohn's disease with intestinal obstruction 03/01/2012    **Patient does not have celiac disease.  False-positive   . Iron deficiency anemia   . GERD (gastroesophageal reflux disease)   . ANEMIA-IRON DEFICIENCY 04/02/2009    Qualifier: Diagnosis of  By: Fuller Plan MD Lamont Snowball T   . DUODENITIS 04/02/2009    Qualifier: Diagnosis of  By: Fuller Plan MD Marijo Conception Protein-calorie malnutrition, severe 04/15/2013  . GI bleed 04/22/2013     EGD unremarkable    Past Surgical History  Procedure Laterality Date  . Esophagogastroduodenoscopy  Oct 2010    Dr. Fuller Plan: duodenitis, normal villi, likely Crohn's. Elevated Gliadin but normal TTG, IgA and IgA  . Colonoscopy  Oct 2010    Dr. Fuller Plan: evidence of Crohn's at cecum, ascending colon, unable to intubate the terminal ileum. CTE with distal and terminal ileitis  . Esophagogastroduodenoscopy N/A 04/24/2013    Procedure: ESOPHAGOGASTRODUODENOSCOPY (EGD);  Surgeon: Missy Sabins, MD;  [facility] ENDOSCOPY;  Service: Endoscopy;  Laterality: N/A;    Mikey College MS, [place], [place] Pager [phone] After Hours Pager

## 2013-08-02 DIAGNOSIS — D509 Iron deficiency anemia, unspecified: Secondary | ICD-10-CM

## 2013-08-02 DIAGNOSIS — K56609 Unspecified intestinal obstruction, unspecified as to partial versus complete obstruction: Secondary | ICD-10-CM

## 2013-08-02 DIAGNOSIS — K5090 Crohn's disease, unspecified, without complications: Secondary | ICD-10-CM

## 2013-08-02 LAB — BASIC METABOLIC PANEL
BUN: 5 mg/dL — AB (ref 6–23)
CO2: 24 mEq/L (ref 19–32)
CREATININE: 0.65 mg/dL (ref 0.50–1.35)
Calcium: 8.8 mg/dL (ref 8.4–10.5)
Chloride: 107 mEq/L (ref 96–112)
GFR calc Af Amer: >90 mL/min (ref >90)
Glucose, Bld: 111 mg/dL — ABNORMAL HIGH (ref 70–99)
Potassium: 4.8 mEq/L (ref 3.7–5.3)
Sodium: 140 mEq/L (ref 137–147)

## 2013-08-02 LAB — CBC
HCT: 25.3 % — ABNORMAL LOW (ref 39.0–52.0)
Hemoglobin: 7.6 g/dL — ABNORMAL LOW (ref 13.0–17.0)
MCH: 19.8 pg — AB (ref 26.0–34.0)
MCHC: 30 g/dL (ref 30.0–36.0)
MCV: 66.1 fL — AB (ref 78.0–100.0)
PLATELETS: 478 10*3/uL — AB (ref 150–400)
RBC: 3.83 MIL/uL — ABNORMAL LOW (ref 4.22–5.81)
RDW: 16.5 % — ABNORMAL HIGH (ref 11.5–15.5)
WBC: 7.5 10*3/uL (ref 4.0–10.5)

## 2013-08-02 MED ORDER — PREDNISONE 20 MG PO TABS: ORAL_TABLET | ORAL | Status: DC

## 2013-08-02 MED ORDER — OXYCODONE HCL 5 MG PO TABS: 5.0000 mg | ORAL_TABLET | ORAL | Status: DC | PRN

## 2013-08-02 MED ORDER — PREDNISONE 20 MG PO TABS
40.0000 mg | ORAL_TABLET | Freq: Two times a day (BID) | ORAL | Status: DC
  Filled 2013-08-02 (×3): qty 2

## 2013-08-02 NOTE — Discharge Instructions (Signed)
Crohn's Disease Crohn's disease is a long-term (chronic) soreness and redness (inflammation) of the intestines (bowel). It can affect any portion of the digestive tract, from the mouth to the anus. It can also cause problems outside the digestive tract. Crohn's disease is closely related to a disease called ulcerative colitis (together, these two diseases are called inflammatory bowel disease).  CAUSES  The cause of Crohn's disease is not known. One Link Snuffer is that, in an easily affected person, the immune system is triggered to attack the body's own digestive tissue. Crohn's disease runs in families. It seems to be more common in certain geographic areas and amongst certain races. There are no clear-cut dietary causes.  SYMPTOMS  Crohn's disease can cause many different symptoms since it can affect many different parts of the body. Symptoms include:  Fatigue.  Weight loss.  Chronic diarrhea, sometime bloody.  Abdominal pain and cramps.  Fever.  Ulcers or canker sores in the mouth or rectum.  Anemia (low red blood cells).  Arthritis, skin problems, and eye problems may occur. Complications of Crohn's disease can include:  Series of holes (perforation) of the bowel.  Portions of the intestines sticking to each other (adhesions).  Obstruction of the bowel.  Fistula formation, typically in the rectal area but also in other areas. A fistula is an opening between the bowels and the outside, or between the bowels and another organ.  A painful crack in the mucous membrane of the anus (rectal fissure). DIAGNOSIS  Your caregiver may suspect Crohn's disease based on your symptoms and an exam. Blood tests may confirm that there is a problem. You may be asked to submit a stool specimen for examination. X-rays and CT scans may be necessary. Ultimately, the diagnosis is usually made after a procedure that uses a flexible tube that is inserted via your mouth or your anus. This is done under  sedation and is called either an upper endoscopy or colonoscopy. With these tests, the specialist can take tiny tissue samples and remove them from the inside of the bowel (biopsy). Examination of this biopsy tissue under a microscope can reveal Crohn's disease as the cause of your symptoms. Due to the many different forms that Crohn's disease can take, symptoms may be present for several years before a diagnosis is made. TREATMENT  Medications are often used to decrease inflammation and control the immune system. These include medicines related to aspirin, steroid medications, and newer and stronger medications to slow down the immune system. Some medications may be used as suppositories or enemas. A number of other medications are used or have been studied. Your caregiver will make specific recommendations. HOME CARE INSTRUCTIONS   Symptoms such as diarrhea can be controlled with medications. Avoid foods that have a laxative effect such as fresh fruit, vegetables and dairy products. During flare ups, you can rest your bowel by refraining from solid foods. Drink clear liquids frequently during the day (electrolyte or re-hydrating fluids are best. Your caregiver can help you with suggestions). Drink often to prevent loss of body fluids (dehydration). When diarrhea has cleared, eat small meals and more frequently. Avoid food additives and stimulants such as caffeine (coffee, tea, or chocolate). Enzyme supplements may help if you develop intolerance to a sugar in dairy products (lactose). Ask your caregiver or dietitian about specific dietary instructions.  Try to maintain a positive attitude. Learn relaxation techniques such as self hypnosis, mental imaging, and muscle relaxation.  If possible, avoid stresses which can aggravate your condition.  Exercise regularly.  Follow your diet.  Always get plenty of rest. SEEK MEDICAL CARE IF:   Your symptoms fail to improve after a week or two of new  treatment.  You experience continued weight loss.  You have ongoing cramps or loose bowels.  You develop a new skin rash, skin sores, or eye problems. SEEK IMMEDIATE MEDICAL CARE IF:   You have worsening of your symptoms or develop new symptoms.  You have a fever.  You develop bloody diarrhea.  You develop severe abdominal pain. MAKE SURE YOU:   Understand these instructions.  Will watch your condition.  Will get help right away if you are not doing well or get worse. Document Released: 01/20/2005 Document Revised: 08/07/2012 Document Reviewed: 12/19/2006 [facility] Patient Information 2014 [place], [place].

## 2013-08-02 NOTE — Discharge Summary (Signed)
[facility]  Physician Discharge Summary   Patient ID: Shane Klein MRN: [id number] DOB/AGE: 1991/04/07 23 y.o.  Admit date: 07/31/2013 Discharge date: 08/02/2013  PCP: Patient will see providers at [place]:  Principal Problem:   Crohn's ileitis Active Problems:   ANEMIA-IRON DEFICIENCY   Partial small bowel obstruction   Diarrhea   Tobacco use   RECOMMENDATIONS FOR OUTPATIENT FOLLOW UP: 1. Patient understands importance of close follow up with PCP 2. He will need referral to GI  DISCHARGE CONDITION: fair  Diet recommendation: Soft Diet for 4 days and then advance as tolerated  Filed Weights   07/31/13 2141  Weight: 75.8 kg (167 lb 1.7 oz)    INITIAL HISTORY: Shane Klein is a 23 y.o. male with a history of Crohn's disease, diagnosed in 2011, duodenitis, and iron deficiency anemia, who presented with a three-day history of lower abdominal pain and diarrhea.   Consultations:  None  Procedures:  None  HOSPITAL COURSE:   Abdominal pain and diarrhea, likely secondary to exacerbation of Crohn's ileitis and partial small bowel obstruction.  Symptoms were secondary to crohn's. C diff was negative. The patient had been lost to followup due to insurance issues. He now has Medicaid and plans to see a provider at [facility] to begin with. His symptoms improved slowly. He started having bowel movement. He was started on clear liquids and then advanced slowly to full liquids. He is tolerating well. Has had BM's without blood. Denies abdominal pain. He will be advanced to soft diet and if he tolerates he will be discharged home. He was started on steroids which will be prescribed as well and slowly tapered.  Iron deficiency anemia.  The patient has a history of duodenitis and GERD. During his previous hospitalization in 2014, he had to be transfused 2 units of packed red blood cells for hemoglobin of less than 7. The EGD  at that time was unremarkable. His hemoglobin has dropped some with hydration. No clear indication to transfuse during this visit.   Tobacco and marijuana use  The patient was advised to stop both.  Patient has improved steadily. He will be discharged if he tolerates his diet.   PERTINENT LABS:  The results of significant diagnostics from this hospitalization (including imaging, microbiology, ancillary and laboratory) are listed below for reference.    Microbiology: Recent Results (from the past 240 hour(s))  CLOSTRIDIUM DIFFICILE BY PCR     Status: None   Collection Time    08/01/13  5:05 AM      Result Value Ref Range Status   C difficile by pcr NEGATIVE  NEGATIVE Final   Comment: Performed at [place]: Basic Metabolic Panel:  Recent Labs Lab 07/31/13 1338 08/01/13 0513 08/02/13 0523  NA 137 139 140  K 4.1 4.9 4.8  CL 103 106 107  CO2 23 24 24   GLUCOSE 76 117* 111*  BUN 12 8 5*  CREATININE 0.95 0.81 0.65  CALCIUM 8.9 8.8 8.8   Liver Function Tests:  Recent Labs Lab 07/31/13 1338 08/01/13 0513  AST 12 11  ALT 7 7  ALKPHOS 62 56  BILITOT <0.2* <0.2*  PROT 6.6 6.0  ALBUMIN 2.7* 2.4*    Recent Labs Lab 07/31/13 1338  LIPASE 16   CBC:  Recent Labs Lab 07/31/13 1338 08/01/13 0513 08/02/13 0523  WBC 6.7 5.5 7.5  NEUTROABS 3.9  --   --  HGB 8.3* 7.8* 7.6*  HCT 26.8* 25.5* 25.3*  MCV 65.7* 65.7* 66.1*  PLT 568* 496* 478*    IMAGING STUDIES Dg Abd 1 View  08/01/2013   CLINICAL DATA:  Small bowel obstruction.  Nausea.  EXAM: ABDOMEN - 1 VIEW  COMPARISON:  CT ABD/PELVIS W CM dated 07/31/2013; DG ABD ACUTE W/CHEST dated 06/21/2013  FINDINGS: Oral contrast material has progressed from the prior CT and is now present throughout the colon and rectum. No dilated loops of small bowel are seen, although there is a relative paucity of small bowel gas. No gross intraperitoneal free air is identified. No acute osseous abnormality is seen.   IMPRESSION: Interval progression of oral contrast material into the colon and rectum. No dilated loops of bowel seen to suggest worsening obstruction.   Electronically Signed   By: Logan Bores   On: 08/01/2013 10:51   Ct Abdomen Pelvis W Contrast  07/31/2013   CLINICAL DATA:  Left-sided abdominal pain with nausea, vomiting, and diarrhea. History of Crohn's disease.  EXAM: CT ABDOMEN AND PELVIS WITH CONTRAST  TECHNIQUE: Multidetector CT imaging of the abdomen and pelvis was performed using the standard protocol following bolus administration of intravenous contrast.  CONTRAST:  184m OMNIPAQUE IOHEXOL 300 MG/ML  SOLN  COMPARISON:  Radiographs dated 06/21/2013 and CT scan dated 04/13/2013  FINDINGS: There is marked dilatation of the distal ileum in an area of abnormal soft tissue at the terminal ileum and ileocecal valve. There is thickening of the wall of the terminal ileum. There is a second dilated loop of small bowel in the right mid abdomen which extends into the same area and appears partially obstructed.  Liver, spleen, pancreas, adrenal glands, biliary tree, and kidneys are normal. There is a tiny amount of free fluid in the pelvis. No osseous abnormality. No free air. Lung bases are clear.  IMPRESSION: Inflammatory process in the right lower quadrant at the ileocecal valve consistent with Crohn's disease with 2 areas of partial small bowel obstruction arising in the same area. The appearance is similar to that seen on the prior exam.   Electronically Signed   By: JRozetta NunneryM.D.   On: 07/31/2013 18:47    DISCHARGE EXAMINATION: Filed Vitals:   08/01/13 0630 08/01/13 1423 08/01/13 2058 08/02/13 0602  BP: 95/65 142/73 125/66 137/82  Pulse: 58  73 73  Temp: 98 F (36.7 C) 99 F (37.2 C) 98.3 F (36.8 C) 97.6 F (36.4 C)  TempSrc: Oral  Oral Oral  Resp: 18 18 20 16   Height:      Weight:      SpO2: 99% 99% 98% 99%   General appearance: alert, cooperative, appears stated age and no  distress Resp: clear to auscultation bilaterally Cardio: regular rate and rhythm, S1, S2 normal, no murmur, click, rub or gallop GI: soft, non-tender; bowel sounds normal; no masses,  no organomegaly  DISPOSITION: Home  Discharge Orders   Future Orders Complete By Expires   Call MD for:  persistant nausea and vomiting  As directed    Call MD for:  severe uncontrolled pain  As directed    Discharge diet:  As directed    Discharge instructions  As directed    Increase activity slowly  As directed      ALLERGIES:  Allergies  Allergen Reactions  . Gluten Meal Other (See Comments)    Stomach pain  . Wheat Bran Other (See Comments)    Belly pain    Current Discharge  Medication List    START taking these medications   Details  oxyCODONE (OXY IR/ROXICODONE) 5 MG immediate release tablet Take 1 tablet (5 mg total) by mouth every 4 (four) hours as needed for severe pain. Qty: 15 tablet, Refills: 0    predniSONE (DELTASONE) 20 MG tablet Take 2 tablets twice daily for 3 days, then take 2 tablets once daily for 4 days, then take 1 tablet once daily for 10 days, then STOP. Qty: 30 tablet, Refills: 0       Follow-up Information   Follow up with [facility]. Schedule an appointment as soon as possible for a visit in 1 week. (post hospitalization follow up)       TOTAL DISCHARGE TIME: 35 mins  [facility] Pager [phone]  08/02/2013, 12:20 PM

## 2013-08-02 NOTE — Progress Notes (Signed)
Patient discharged to home, discharge instructions reviewed with patient who verbalized understanding, new RX's given to patient.

## 2014-01-10 ENCOUNTER — Encounter: Payer: Self-pay | Admitting: Gastroenterology

## 2014-08-04 ENCOUNTER — Encounter (HOSPITAL_COMMUNITY): Payer: Self-pay | Admitting: Emergency Medicine

## 2014-08-04 ENCOUNTER — Emergency Department (HOSPITAL_COMMUNITY): Payer: Medicaid Other

## 2014-08-04 ENCOUNTER — Inpatient Hospital Stay (HOSPITAL_COMMUNITY)
Admission: EM | Admit: 2014-08-04 | Discharge: 2014-08-06 | DRG: 385 | Disposition: A | Discharge status: Home / Self Care | Payer: Medicaid Other | Attending: Internal Medicine | Admitting: Internal Medicine

## 2014-08-04 DIAGNOSIS — E43 Unspecified severe protein-calorie malnutrition: Secondary | ICD-10-CM

## 2014-08-04 DIAGNOSIS — K2980 Duodenitis without bleeding: Secondary | ICD-10-CM | POA: Diagnosis not present

## 2014-08-04 DIAGNOSIS — J189 Pneumonia, unspecified organism: Secondary | ICD-10-CM

## 2014-08-04 DIAGNOSIS — F1290 Cannabis use, unspecified, uncomplicated: Secondary | ICD-10-CM | POA: Diagnosis present

## 2014-08-04 DIAGNOSIS — R1032 Left lower quadrant pain: Secondary | ICD-10-CM

## 2014-08-04 DIAGNOSIS — D6959 Other secondary thrombocytopenia: Secondary | ICD-10-CM | POA: Diagnosis not present

## 2014-08-04 DIAGNOSIS — Z9119 Patient's noncompliance with other medical treatment and regimen: Secondary | ICD-10-CM | POA: Diagnosis present

## 2014-08-04 DIAGNOSIS — F17200 Nicotine dependence, unspecified, uncomplicated: Secondary | ICD-10-CM | POA: Diagnosis not present

## 2014-08-04 DIAGNOSIS — Z7952 Long term (current) use of systemic steroids: Secondary | ICD-10-CM

## 2014-08-04 DIAGNOSIS — F1210 Cannabis abuse, uncomplicated: Secondary | ICD-10-CM

## 2014-08-04 DIAGNOSIS — D75839 Thrombocytosis, unspecified: Secondary | ICD-10-CM | POA: Diagnosis present

## 2014-08-04 DIAGNOSIS — K5080 Crohn's disease of both small and large intestine without complications: Secondary | ICD-10-CM | POA: Diagnosis present

## 2014-08-04 DIAGNOSIS — K56600 Partial intestinal obstruction, unspecified as to cause: Secondary | ICD-10-CM | POA: Diagnosis present

## 2014-08-04 DIAGNOSIS — K5090 Crohn's disease, unspecified, without complications: Secondary | ICD-10-CM | POA: Diagnosis not present

## 2014-08-04 DIAGNOSIS — Z87891 Personal history of nicotine dependence: Secondary | ICD-10-CM | POA: Diagnosis not present

## 2014-08-04 DIAGNOSIS — D473 Essential (hemorrhagic) thrombocythemia: Secondary | ICD-10-CM

## 2014-08-04 DIAGNOSIS — K50112 Crohn's disease of large intestine with intestinal obstruction: Secondary | ICD-10-CM

## 2014-08-04 DIAGNOSIS — Z8249 Family history of ischemic heart disease and other diseases of the circulatory system: Secondary | ICD-10-CM | POA: Diagnosis not present

## 2014-08-04 DIAGNOSIS — R109 Unspecified abdominal pain: Secondary | ICD-10-CM

## 2014-08-04 DIAGNOSIS — Z9114 Patient's other noncompliance with medication regimen: Secondary | ICD-10-CM | POA: Diagnosis not present

## 2014-08-04 DIAGNOSIS — D509 Iron deficiency anemia, unspecified: Secondary | ICD-10-CM

## 2014-08-04 DIAGNOSIS — Z833 Family history of diabetes mellitus: Secondary | ICD-10-CM | POA: Diagnosis not present

## 2014-08-04 DIAGNOSIS — Z720 Tobacco use: Secondary | ICD-10-CM

## 2014-08-04 DIAGNOSIS — K50812 Crohn's disease of both small and large intestine with intestinal obstruction: Principal | ICD-10-CM | POA: Diagnosis present

## 2014-08-04 DIAGNOSIS — K219 Gastro-esophageal reflux disease without esophagitis: Secondary | ICD-10-CM

## 2014-08-04 HISTORY — DX: Unspecified intestinal obstruction, unspecified as to partial versus complete obstruction: K56.609

## 2014-08-04 LAB — CBC WITH DIFFERENTIAL/PLATELET
BASOS PCT: 0 % (ref 0–1)
Basophils Absolute: 0 10*3/uL (ref 0.0–0.1)
EOS ABS: 0.4 10*3/uL (ref 0.0–0.7)
Eosinophils Relative: 6 % — ABNORMAL HIGH (ref 0–5)
HCT: 28.7 % — ABNORMAL LOW (ref 39.0–52.0)
Hemoglobin: 8.7 g/dL — ABNORMAL LOW (ref 13.0–17.0)
LYMPHS ABS: 1.2 10*3/uL (ref 0.7–4.0)
LYMPHS PCT: 16 % (ref 12–46)
MCH: 19.9 pg — ABNORMAL LOW (ref 26.0–34.0)
MCHC: 30.3 g/dL (ref 30.0–36.0)
MCV: 65.5 fL — ABNORMAL LOW (ref 78.0–100.0)
MONO ABS: 1.2 10*3/uL — AB (ref 0.1–1.0)
MONOS PCT: 17 % — AB (ref 3–12)
Neutro Abs: 4.5 10*3/uL (ref 1.7–7.7)
Neutrophils Relative %: 61 % (ref 43–77)
PLATELETS: 540 10*3/uL — AB (ref 150–400)
RBC: 4.38 MIL/uL (ref 4.22–5.81)
RDW: 17.4 % — AB (ref 11.5–15.5)
WBC: 7.3 10*3/uL (ref 4.0–10.5)

## 2014-08-04 LAB — COMPREHENSIVE METABOLIC PANEL
ALBUMIN: 2.6 g/dL — AB (ref 3.5–5.2)
ALK PHOS: 61 U/L (ref 39–117)
ALT: 12 U/L (ref 0–53)
AST: 20 U/L (ref 0–37)
Anion gap: 8 (ref 5–15)
BILIRUBIN TOTAL: 0.6 mg/dL (ref 0.3–1.2)
BUN: 11 mg/dL (ref 6–23)
CALCIUM: 8.6 mg/dL (ref 8.4–10.5)
CO2: 22 mmol/L (ref 19–32)
CREATININE: 0.89 mg/dL (ref 0.50–1.35)
Chloride: 107 mmol/L (ref 96–112)
Glucose, Bld: 89 mg/dL (ref 70–99)
Potassium: 4.1 mmol/L (ref 3.5–5.1)
Sodium: 137 mmol/L (ref 135–145)
TOTAL PROTEIN: 6.3 g/dL (ref 6.0–8.3)

## 2014-08-04 LAB — PROCALCITONIN

## 2014-08-04 LAB — LACTIC ACID, PLASMA: Lactic Acid, Venous: 0.5 mmol/L (ref 0.5–2.0)

## 2014-08-04 MED ORDER — CETYLPYRIDINIUM CHLORIDE 0.05 % MT LIQD
7.0000 mL | Freq: Two times a day (BID) | OROMUCOSAL | Status: DC
  Administered 2014-08-05 (×2): 7 mL via OROMUCOSAL

## 2014-08-04 MED ORDER — KETOROLAC TROMETHAMINE 30 MG/ML IJ SOLN
30.0000 mg | Freq: Once | INTRAMUSCULAR | Status: AC
  Administered 2014-08-04: 30 mg via INTRAVENOUS
  Filled 2014-08-04: qty 1

## 2014-08-04 MED ORDER — SODIUM CHLORIDE 0.9 % IJ SOLN
3.0000 mL | Freq: Two times a day (BID) | INTRAMUSCULAR | Status: DC
  Administered 2014-08-05 (×2): 3 mL via INTRAVENOUS

## 2014-08-04 MED ORDER — PANTOPRAZOLE SODIUM 40 MG IV SOLR
40.0000 mg | INTRAVENOUS | Status: DC
  Administered 2014-08-04 – 2014-08-05 (×2): 40 mg via INTRAVENOUS
  Filled 2014-08-04 (×3): qty 40

## 2014-08-04 MED ORDER — ENOXAPARIN SODIUM 40 MG/0.4ML ~~LOC~~ SOLN
40.0000 mg | SUBCUTANEOUS | Status: DC
  Administered 2014-08-04 – 2014-08-05 (×2): 40 mg via SUBCUTANEOUS
  Filled 2014-08-04 (×3): qty 0.4

## 2014-08-04 MED ORDER — IOHEXOL 300 MG/ML  SOLN
100.0000 mL | Freq: Once | INTRAMUSCULAR | Status: AC | PRN
  Administered 2014-08-04: 100 mL via INTRAVENOUS

## 2014-08-04 MED ORDER — HYDROMORPHONE HCL 1 MG/ML IJ SOLN
1.0000 mg | Freq: Once | INTRAMUSCULAR | Status: AC
  Administered 2014-08-04: 1 mg via INTRAVENOUS
  Filled 2014-08-04: qty 1

## 2014-08-04 MED ORDER — IOHEXOL 300 MG/ML  SOLN
25.0000 mL | INTRAMUSCULAR | Status: AC
  Administered 2014-08-04: 25 mL via ORAL

## 2014-08-04 MED ORDER — METHYLPREDNISOLONE SODIUM SUCC 125 MG IJ SOLR
125.0000 mg | Freq: Once | INTRAMUSCULAR | Status: AC
  Administered 2014-08-04: 125 mg via INTRAVENOUS
  Filled 2014-08-04: qty 2

## 2014-08-04 MED ORDER — SODIUM CHLORIDE 0.9 % IV SOLN
INTRAVENOUS | Status: DC
  Administered 2014-08-04: 23:00:00 via INTRAVENOUS

## 2014-08-04 MED ORDER — OXYCODONE HCL 5 MG PO TABS: 5.0000 mg | ORAL_TABLET | ORAL | Status: DC | PRN

## 2014-08-04 MED ORDER — SODIUM CHLORIDE 0.9 % IV BOLUS (SEPSIS)
1000.0000 mL | Freq: Once | INTRAVENOUS | Status: AC
  Administered 2014-08-04: 1000 mL via INTRAVENOUS

## 2014-08-04 MED ORDER — ONDANSETRON HCL 4 MG/2ML IJ SOLN: 4.0000 mg | Freq: Four times a day (QID) | INTRAMUSCULAR | Status: DC | PRN

## 2014-08-04 MED ORDER — CHLORHEXIDINE GLUCONATE 0.12 % MT SOLN
15.0000 mL | Freq: Two times a day (BID) | OROMUCOSAL | Status: DC
  Administered 2014-08-04 – 2014-08-06 (×4): 15 mL via OROMUCOSAL
  Filled 2014-08-04 (×6): qty 15

## 2014-08-04 MED ORDER — HYDROMORPHONE HCL 1 MG/ML IJ SOLN
0.5000 mg | Freq: Three times a day (TID) | INTRAMUSCULAR | Status: DC | PRN
  Administered 2014-08-05: 0.5 mg via INTRAVENOUS
  Filled 2014-08-04: qty 1

## 2014-08-04 NOTE — Discharge Instructions (Signed)
As discussed, it is important that you follow up as soon as possible with your physician for continued management of your condition. ° °If you develop any new, or concerning changes in your condition, please return to the emergency department immediately. ° °

## 2014-08-04 NOTE — ED Notes (Signed)
Called CT to advise pt has drank contrast.  Pt is on list.

## 2014-08-04 NOTE — ED Provider Notes (Signed)
CSN: [id number]     Arrival date & time 08/04/14  1047 History   First MD Initiated Contact with Patient 08/04/14 1107     Chief Complaint  Patient presents with  . Abdominal Pain     (Consider location/radiation/quality/duration/timing/severity/associated sxs/prior Treatment) HPI Patient presents with concern of new left lower quadrant pain. Symptoms began yesterday, suddenly. Since onset symptoms of been persistent. There are severe nonradiating left lower quadrant, left lower midline abdominal pain. Pain is sharp, severe, crampy. No medication taken for relief. No concurrent fever, chills, vomiting. Patient does describe anorexia, and loose stool. He acknowledges a history of Crohn's disease, denies other medical issues.  Past Medical History  Diagnosis Date  . 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. Crohn's disease with intestinal obstruction 03/01/2012    **Patient does not have celiac disease.  False-positive   . Iron deficiency anemia   . GERD (gastroesophageal reflux disease)   . ANEMIA-IRON DEFICIENCY 04/02/2009    Qualifier: Diagnosis of  By: Fuller Plan MD Lamont Snowball T   . DUODENITIS 04/02/2009    Qualifier: Diagnosis of  By: Fuller Plan MD Marijo Conception Protein-calorie malnutrition, severe 04/15/2013  . GI bleed 04/22/2013    EGD unremarkable   Past Surgical History  Procedure Laterality Date  . Esophagogastroduodenoscopy  Oct 2010    Dr. Fuller Plan: duodenitis, normal villi, likely Crohn's. Elevated Gliadin but normal TTG, IgA and IgA  . Colonoscopy  Oct 2010    Dr. Fuller Plan: evidence of Crohn's at cecum, ascending colon, unable to intubate the terminal ileum. CTE with distal and terminal ileitis  . Esophagogastroduodenoscopy N/A 04/24/2013    Procedure: ESOPHAGOGASTRODUODENOSCOPY (EGD);  Surgeon: Missy Sabins, MD;  Location: [facility] ENDOSCOPY;  Service: Endoscopy;  Laterality: N/A;   Family History  Problem Relation Age of Onset  . Diabetes Maternal Aunt   . Diabetes Maternal Grandmother   .  Colon cancer Neg Hx   . Crohn's disease Maternal Aunt    History  Substance Use Topics  . Smoking status: Former Smoker    Types: Cigarettes    Quit date: 01/24/2013  . Smokeless tobacco: Never Used     Comment: 2-3 cigarettes per day  . Alcohol Use: No     Comment: rarely    Review of Systems  Constitutional:       Per HPI, otherwise negative  HENT:       Per HPI, otherwise negative  Respiratory:       Per HPI, otherwise negative  Cardiovascular:       Per HPI, otherwise negative  Gastrointestinal: Negative for vomiting.  Endocrine:       Negative aside from HPI  Genitourinary:       Neg aside from HPI   Musculoskeletal:       Per HPI, otherwise negative  Skin: Negative.   Neurological: Negative for syncope.      Allergies  Gluten meal and Wheat bran  Home Medications   Prior to Admission medications   Medication Sig Start Date End Date Taking? Authorizing Provider  oxyCODONE (OXY IR/ROXICODONE) 5 MG immediate release tablet Take 1 tablet (5 mg total) by mouth every 4 (four) hours as needed for severe pain. 08/02/13   Bonnielee Haff, MD  predniSONE (DELTASONE) 20 MG tablet Take 2 tablets twice daily for 3 days, then take 2 tablets once daily for 4 days, then take 1 tablet once daily for 10 days, then STOP. 08/02/13   Bonnielee Haff, MD   BP 137/73 mmHg  Pulse 91  Temp(Src) 98.3 F (36.8 C) (Oral)  Resp 16  Ht 5' 10"  (1.778 m)  Wt 180 lb 8 oz (81.874 kg)  BMI 25.90 kg/m2  SpO2 98% Physical Exam  Constitutional: He is oriented to person, place, and time. He appears well-developed. No distress.  HENT:  Head: Normocephalic and atraumatic.  Eyes: Conjunctivae and EOM are normal.  Cardiovascular: Normal rate and regular rhythm.   Pulmonary/Chest: Effort normal. No stridor. No respiratory distress.  Abdominal: He exhibits no distension. There is no hepatosplenomegaly. There is tenderness in the suprapubic area, left upper quadrant and left lower quadrant. There is  guarding. There is no rigidity and no CVA tenderness.  Musculoskeletal: He exhibits no edema.  Neurological: He is alert and oriented to person, place, and time.  Skin: Skin is warm and dry.  Psychiatric: He has a normal mood and affect.  Nursing note and vitals reviewed.   ED Course  Procedures (including critical care time) Labs Review Labs Reviewed  COMPREHENSIVE METABOLIC PANEL - Abnormal; Notable for the following:    Albumin 2.6 (*)    All other components within normal limits  CBC WITH DIFFERENTIAL/PLATELET - Abnormal; Notable for the following:    Hemoglobin 8.7 (*)    HCT 28.7 (*)    MCV 65.5 (*)    MCH 19.9 (*)    RDW 17.4 (*)    Platelets 540 (*)    Monocytes Relative 17 (*)    Eosinophils Relative 6 (*)    Monocytes Absolute 1.2 (*)    All other components within normal limits    Imaging Review No results found.   EKG Interpretation None     A review of the chart demonstrates history of Crohn's disease, prior episodes of duodenitis, obstruction.  5:19 PM I discussed the patient's case with our radiology department.  The images were temporarily not available, causing a delay. MDM   Final diagnoses:  Abdominal pain   partial bowel obstruction  This young male with history of Crohn's disease presents with new left lower quadrant abdominal pain. The patient is awake and alert, though with a notably tender abdomen, specifically in the left lower quadrant. Patient had pain reduction here, though required serial narcotic dosing. On sign out the patient had a CT scan that was pending.    On chart review - patient's CT scan demonstrate partial small bowel obstruction, inflammation about the terminal ileum. Patient was admitted for further evaluation and management.    Carmin Muskrat, MD 08/06/14 1745

## 2014-08-04 NOTE — ED Notes (Signed)
Pt. Stated, Im having stomach pain and a little loose stool. Started yesterday

## 2014-08-04 NOTE — H&P (Signed)
Date: 08/04/2014               Patient Name:  Shane Klein MRN: [id number]  DOB: 09-14-1990 Age / Sex: 24 y.o., male   PCP: No Pcp Per Patient         Medical Service: Internal Medicine Teaching Service         Attending Physician: Dr. Aldine Contes    First Contact: Dr. Karle Starch Moding Pager: [phone]  Second Contact: Dr. Michail Jewels Pager: [phone]       After Hours (After 5p/  First Contact Pager: [phone]  weekends / holidays): Second Contact Pager: [phone]   Chief Complaint: abdominal pain that started yesterday  History of Present Illness: [name] is a 24 yo man with a history of Crohn's disease, duodenitis, iron deficiency anemia and silicatosis who presented to the ED with abdominal pain since yesterday. He had cramping, left lower quadrant pain that came and went every 2-3 minutes yesterday. The pain worsened to a 10/10 in intensity last night. He feels bloated. He had one loose bowel movement early this morning but denies diarrhea or vomiting. He has not noticed any blood or discoloration of his bowel movements. He has not had anything to eat yesterday or today, but has been drinking water and soda.   Mr. Cerone has had multiple hospitalizations at [facility] and elsewhere for Crohn's exacerbations with partial small bowel obstruction, most recently at [facility] in 02/2014. He was sent home on a course of prednisone that has now been completed. He has never required surgery. He has seen Dr. Fuller Plan in the past (2014), but has never had consistent follow up for his Crohn's Disease. He has tried budesonide, azathioprine and sulfasalazine in the past.   Meds: Has not been taking any medications at home  Allergies: Allergies as of 08/04/2014 - Review Complete 08/04/2014  Allergen Reaction Noted  . Gluten meal Other (See Comments) 10/02/2012  . Wheat Shane Other (See Comments) 03/01/2012   Past Medical History  Diagnosis Date  . 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. Crohn's  disease with intestinal obstruction 03/01/2012    **Patient does not have celiac disease.  False-positive   . Iron deficiency anemia   . GERD (gastroesophageal reflux disease)   . ANEMIA-IRON DEFICIENCY 04/02/2009    Qualifier: Diagnosis of  By: Fuller Plan MD Lamont Snowball T   . DUODENITIS 04/02/2009    Qualifier: Diagnosis of  By: Fuller Plan MD Marijo Conception Protein-calorie malnutrition, severe 04/15/2013  . GI bleed 04/22/2013    EGD unremarkable   Past Surgical History  Procedure Laterality Date  . Esophagogastroduodenoscopy  Oct 2010    Dr. Fuller Plan: duodenitis, normal villi, likely Crohn's. Elevated Gliadin but normal TTG, IgA and IgA  . Colonoscopy  Oct 2010    Dr. Fuller Plan: evidence of Crohn's at cecum, ascending colon, unable to intubate the terminal ileum. CTE with distal and terminal ileitis  . Esophagogastroduodenoscopy N/A 04/24/2013    Procedure: ESOPHAGOGASTRODUODENOSCOPY (EGD);  Surgeon: Missy Sabins, MD;  Location: [facility] ENDOSCOPY;  Service: Endoscopy;  Laterality: N/A;   Family History  Problem Relation Age of Onset  . Diabetes Maternal Aunt   . Diabetes Maternal Grandmother   . Colon cancer Neg Hx   . Crohn's disease Maternal Aunt    History   Social History  . Marital Status: Single    Spouse Name: N/A  . Number of Children: N/A  . Years of Education: N/A   Occupational History  .  unemployed    Social History Main Topics  . Smoking status: Former Smoker    Types: Cigarettes    Quit date: 01/24/2013  . Smokeless tobacco: Never Used     Comment: 2-3 cigarettes per day  . Alcohol Use: No     Comment: rarely  . Drug Use: No  . Sexual Activity: Not on file   Other Topics Concern  . Not on file   Social History Narrative    Review of Systems: See HPI  Physical Exam: Blood pressure 125/74, pulse 95, temperature 98.3 F (36.8 C), temperature source Oral, resp. rate 16, height 5\' 10"  (1.778 m), weight 180 lb 8 oz (81.874 kg), SpO2 97 %. Appearance: in NAD,  watching TV, lying in bed HEENT: AT/[place], PERRL, EOMi, moist mucous membranes, no oral lesions Heart: RRR, normal S1S2, no murmurs Lungs: CTAB, normal work of breathing Abdomen: BS present, soft, diffusely tender, most tender in the LUQ and LLQ, no rebound tenderness, no guarding Musculoskeletal: normal range of motion Extremities: no lower extremity edema Neurologic: A&Ox3, grossly intact Skin: no rashes or lesions   Lab results: Basic Metabolic Panel:  Recent Labs  08/04/14 1139  NA 137  K 4.1  CL 107  CO2 22  GLUCOSE 89  BUN 11  CREATININE 0.89  CALCIUM 8.6   Liver Function Tests:  Recent Labs  08/04/14 1139  AST 20  ALT 12  ALKPHOS 61  BILITOT 0.6  PROT 6.3  ALBUMIN 2.6*   CBC:  Recent Labs  08/04/14 1139  WBC 7.3  NEUTROABS 4.5  HGB 8.7*  HCT 28.7*  MCV 65.5*  PLT 540*    Imaging results:  Ct Abdomen Pelvis W Contrast  08/04/2014   CLINICAL DATA:  Acute left lower quadrant abdominal pain.  Diarrhea.  EXAM: CT ABDOMEN AND PELVIS WITH CONTRAST  TECHNIQUE: Multidetector CT imaging of the abdomen and pelvis was performed using the standard protocol following bolus administration of intravenous contrast.  CONTRAST:  153mL OMNIPAQUE IOHEXOL 300 MG/ML  SOLN  COMPARISON:  None.  FINDINGS: Visualized lung bases appear normal. No significant osseous abnormality is noted.  No gallstones are noted. The liver, spleen and pancreas appear normal. Adrenal glands and kidneys appear normal. No hydronephrosis or renal obstruction is noted.  There is severe wall thickening and surrounding inflammation involving the terminal ileum consistent with Crohn's disease. This results in dilatation of the more proximal small bowel. Contrast is noted in the colon. Urinary bladder is decompressed. Small amount of free fluid is noted in the dependent portion of the urinary bladder. Enlarged mesenteric lymph nodes are noted which most likely are inflammatory in origin.  IMPRESSION: Severe wall  thickening with surrounding inflammation is seen involving the terminal ileum consistent with Crohn's disease. This results in dilatation and partial obstruction of the more proximal small bowel. Mesenteric adenopathy is noted which most likely is inflammatory in origin.   Electronically Signed   By: Marijo Conception, M.D.   On: 08/04/2014 20:13    Other results: EKG: Rate 97, normal sinus rhythm  Assessment & Plan by Problem: Principal Problem:   Partial small bowel obstruction Active Problems:   CROHN'S DISEASE-LARGE & SMALL INTESTINE   Crohn's disease with intestinal obstruction   GERD (gastroesophageal reflux disease)   Protein-calorie malnutrition, severe   Tobacco use   Abdominal pain   Thrombocytosis  Mr. Kyreese Chio is a 24 yo man with a history of Crohn's Disease who presents with a partial small bowel obstruction  and likely a flare of his Crohn's Disease.  Partial Small Bowel Obstruction: Partial obstruction visualized on CT in the proximal small bowel, resulting from the wall thickening and inflammation of the terminal ileum. This is likely secondary to his Crohn's Disease. He has had multiple hospitalizations for partial SBO during Crohn's flares in the past. Has followed with Dr. Fuller Plan [facility] but not since 2014. Currently afebrile without a white count. Pain currently well-controlled. No vomiting. Pain to palpation on abdominal exam, but generally benign with bowel sounds present. - GI to see patient in am - Bowel rest - NPO - NG tube low intermittent suction - IVF NS 125 mL/hr - Pain control with 0.5 mg dilaudid IV q8 hours PRN; will try to limit in context of partial SBO - Zofran 4 mg IV q6 hours PRN - Lactic acid pending - Procalcitonin pending - If his partial bowel obstruction does not resolve with supportive medical treatment, consider general surgery consult  Crohn's Disease: Inflammation and severe wall thickening of the terminal ileum visualized on CT.  Diagnosed on colonoscopy in 01/2009. Unfortunately, this patient has struggled with medication compliance, which is again likely contributing. GI providers have expressed concern about his compliance, and have thought Pentasa or mesalamine would not be effective in his case; have recommended against immunomodulators (6MP or azathioprine) given their potential toxicities and fact they would be less effective than biologic agents (infliximab vs. adalimumab vs certolizumab). Patient received 125 mg solumedrol in the ED today. There is some evidence that antibiotics during flares are effective, but will hold for now. - GI to see patient in am; agree with today's solumedrol, tomorrow's dosing to be discussed with GI tomorrow - Empiric IV protonix 40 mg daily - C diff pending - Will need outpatient GI follow up  Erosive Duodenitis: Diagnosed by EGD 01/2009; question, could this be an upper GI presentation of his Crohn's Disease? Normal EGD in 2014. Has followed with Dr. Fuller Plan in the past (but not since 2014). - Empiric IV protonix 40 mg daily  Iron Deficiency Anemia: Likely secondary to GI losses from Crohn's disease. Baseline hemoglobin is ~8.0 to 8.5. He had a hemoglobin of <7 on a prior hospitalization in 2014 and required 2 U packed red blood cell transfusion. He has also received iron infusions in the past. Currently, his hemoglobin is 8.7. He has had a prescription for iron supplements TID in the past (past hospital discharges), but has not been taking them. Vitamin B12 and folate have previously been WNL.  - B12 and folate pending - Trend hemoglobin  Thrombocytosis: This is most likely secondary to his Crohn's inflammation; it could be the result of iron deficiency anemia as well. - Anemia panel pending  Tobacco and Marijuana Use:  - UDS pending  Diet: NPO; once eating, avoid lactose; patient with Crohn's, particularly those with ileal disease, have an increased frequency of acquired lactase  deficiency and symptomatic lactose intolerance  DVT Ppx: [place] lovenox and SCDs  Dispo: Disposition is deferred at this time, awaiting improvement of current medical problems. Anticipated discharge in approximately 2-3 day(s).   The patient does not have a current PCP (No Pcp Per Patient) and does not know need an [facility] hospital follow-up appointment after discharge.  The patient does not have transportation limitations that hinder transportation to clinic appointments.  Signed: Karlene Einstein, MD 08/04/2014, 8:55 PM

## 2014-08-04 NOTE — ED Notes (Signed)
Attempted report 

## 2014-08-04 NOTE — ED Provider Notes (Addendum)
Patient signed out to me by Dr. Vanita Panda. Prolonged delay for having a CT scan read. Patient has a history of Crohn's disease and now has a partial small bowel obstruction. Will give patient dose of steroids here as well as pain medication and will consult hospitalist for admission for exacerbation of Crohn's disease   ED ECG REPORT   Date: 08/04/2014  Rate: 97  Rhythm: normal sinus rhythm  QRS Axis: normal  Intervals: normal  ST/T Wave abnormalities: normal  Conduction Disutrbances:none  Narrative Interpretation:   Old EKG Reviewed: none available  I have personally reviewed the EKG tracing and agree with the computerized printout as noted.  Lacretia Leigh, MD 08/04/14 2033  Lacretia Leigh, MD 08/04/14 [phone]

## 2014-08-05 ENCOUNTER — Encounter (HOSPITAL_COMMUNITY): Payer: Self-pay | Admitting: Internal Medicine

## 2014-08-05 ENCOUNTER — Inpatient Hospital Stay (HOSPITAL_COMMUNITY): Payer: Medicaid Other

## 2014-08-05 DIAGNOSIS — F17200 Nicotine dependence, unspecified, uncomplicated: Secondary | ICD-10-CM

## 2014-08-05 DIAGNOSIS — K50812 Crohn's disease of both small and large intestine with intestinal obstruction: Principal | ICD-10-CM

## 2014-08-05 DIAGNOSIS — Z9114 Patient's other noncompliance with medication regimen: Secondary | ICD-10-CM

## 2014-08-05 DIAGNOSIS — D6959 Other secondary thrombocytopenia: Secondary | ICD-10-CM

## 2014-08-05 LAB — PHOSPHORUS: Phosphorus: 4.9 mg/dL — ABNORMAL HIGH (ref 2.3–4.6)

## 2014-08-05 LAB — RETICULOCYTES
RBC.: 4.03 MIL/uL — ABNORMAL LOW (ref 4.22–5.81)
RETIC COUNT ABSOLUTE: 44.3 10*3/uL (ref 19.0–186.0)
Retic Ct Pct: 1.1 % (ref 0.4–3.1)

## 2014-08-05 LAB — URINALYSIS, ROUTINE W REFLEX MICROSCOPIC
Bilirubin Urine: NEGATIVE
Glucose, UA: NEGATIVE mg/dL
HGB URINE DIPSTICK: NEGATIVE
KETONES UR: NEGATIVE mg/dL
Leukocytes, UA: NEGATIVE
Nitrite: NEGATIVE
PROTEIN: NEGATIVE mg/dL
Specific Gravity, Urine: >1.046 — ABNORMAL HIGH (ref 1.005–1.030)
UROBILINOGEN UA: 0.2 mg/dL (ref 0.0–1.0)
pH: 5.5 (ref 5.0–8.0)

## 2014-08-05 LAB — CBC WITH DIFFERENTIAL/PLATELET
BASOS ABS: 0 10*3/uL (ref 0.0–0.1)
Basophils Relative: 0 % (ref 0–1)
Eosinophils Absolute: 0 10*3/uL (ref 0.0–0.7)
Eosinophils Relative: 0 % (ref 0–5)
HCT: 26.6 % — ABNORMAL LOW (ref 39.0–52.0)
Hemoglobin: 8 g/dL — ABNORMAL LOW (ref 13.0–17.0)
LYMPHS ABS: 0.7 10*3/uL (ref 0.7–4.0)
LYMPHS PCT: 14 % (ref 12–46)
MCH: 19.9 pg — ABNORMAL LOW (ref 26.0–34.0)
MCHC: 30.1 g/dL (ref 30.0–36.0)
MCV: 66 fL — ABNORMAL LOW (ref 78.0–100.0)
MONOS PCT: 2 % — AB (ref 3–12)
Monocytes Absolute: 0.1 10*3/uL (ref 0.1–1.0)
NEUTROS PCT: 84 % — AB (ref 43–77)
Neutro Abs: 3.9 10*3/uL (ref 1.7–7.7)
PLATELETS: 523 10*3/uL — AB (ref 150–400)
RBC: 4.03 MIL/uL — AB (ref 4.22–5.81)
RDW: 17.6 % — AB (ref 11.5–15.5)
WBC: 4.7 10*3/uL (ref 4.0–10.5)

## 2014-08-05 LAB — RAPID URINE DRUG SCREEN, HOSP PERFORMED
AMPHETAMINES: NOT DETECTED
BARBITURATES: NOT DETECTED
Benzodiazepines: NOT DETECTED
Cocaine: NOT DETECTED
Opiates: POSITIVE — AB
TETRAHYDROCANNABINOL: POSITIVE — AB

## 2014-08-05 LAB — IRON AND TIBC
IRON: 12 ug/dL — AB (ref 42–165)
Saturation Ratios: 4 % — ABNORMAL LOW (ref 20–55)
TIBC: 282 ug/dL (ref 215–435)
UIBC: 270 ug/dL (ref 125–400)

## 2014-08-05 LAB — BASIC METABOLIC PANEL
Anion gap: 4 — ABNORMAL LOW (ref 5–15)
BUN: 10 mg/dL (ref 6–23)
CO2: 23 mmol/L (ref 19–32)
CREATININE: 0.79 mg/dL (ref 0.50–1.35)
Calcium: 8.1 mg/dL — ABNORMAL LOW (ref 8.4–10.5)
Chloride: 108 mmol/L (ref 96–112)
GFR calc Af Amer: >90 mL/min (ref >90)
GFR calc non Af Amer: >90 mL/min (ref >90)
Glucose, Bld: 111 mg/dL — ABNORMAL HIGH (ref 70–99)
Potassium: 4.3 mmol/L (ref 3.5–5.1)
Sodium: 135 mmol/L (ref 135–145)

## 2014-08-05 LAB — VITAMIN B12: Vitamin B-12: 388 pg/mL (ref 211–911)

## 2014-08-05 LAB — CLOSTRIDIUM DIFFICILE BY PCR: Toxigenic C. Difficile by PCR: NEGATIVE

## 2014-08-05 LAB — MAGNESIUM: Magnesium: 1.7 mg/dL (ref 1.5–2.5)

## 2014-08-05 LAB — FOLATE: Folate: >20 ng/mL

## 2014-08-05 LAB — FERRITIN: Ferritin: 200 ng/mL (ref 22–322)

## 2014-08-05 MED ORDER — FERROUS SULFATE 325 (65 FE) MG PO TABS
325.0000 mg | ORAL_TABLET | Freq: Every day | ORAL | Status: DC
  Administered 2014-08-06: 325 mg via ORAL
  Filled 2014-08-05 (×2): qty 1

## 2014-08-05 MED ORDER — METHYLPREDNISOLONE SODIUM SUCC 40 MG IJ SOLR
40.0000 mg | Freq: Two times a day (BID) | INTRAMUSCULAR | Status: DC
  Filled 2014-08-05 (×2): qty 1

## 2014-08-05 MED ORDER — METHYLPREDNISOLONE SODIUM SUCC 125 MG IJ SOLR
60.0000 mg | Freq: Every day | INTRAMUSCULAR | Status: DC
  Administered 2014-08-05: 60 mg via INTRAVENOUS
  Filled 2014-08-05: qty 0.96

## 2014-08-05 MED ORDER — PREDNISONE 50 MG PO TABS
60.0000 mg | ORAL_TABLET | Freq: Every day | ORAL | Status: DC
  Administered 2014-08-06: 60 mg via ORAL
  Filled 2014-08-05 (×2): qty 1

## 2014-08-05 NOTE — Progress Notes (Signed)
Utilization Review completed. Nicolette Gieske RN BSN CM 

## 2014-08-05 NOTE — Consult Note (Addendum)
UNASSIGNED PATIENT Reason for Consult: Crohn's disease Referring Physician: Emiliano Dyer Shane Klein is an 24 y.o. male.  HPI: This is a 24 year old gentleman with a past medical history of biopsy-proven Crohn's disease since 2010, medical noncompliance and iron deficiency anemia, who was admitted on 08/04/2014 with a partial small bowel obstruction and in the setting of flare of Crohn's disease. On presentation he had abdominal pain which had started a day prior to admission. His pain is mostly in the left lower quadrant pain, intermittent, crampy, 10/10 before he was admitted. He also had one loose bowel movement but denied diarrhea or vomiting. No constitutional symptoms. He states that he was incarcerated for over a year and he was recently released on 06/09/2014 and therefore he has been unable to follow up with any GI doctors. He also states that his been off the Sulfasalazine for 2 months due to persistence of symptoms despite taking the medication. He has previously also been on Budesonide. He states that he has no issues of affordability with his medications since he has Medicaid. Patient was previously followed by Dr. Fuller Plan who had performed EGD and colonoscopy in 2010, and biopsies of the colon revealed Crohn's disease. EGD also revealed duodenitis. In December 2014, he had a repeat EGD to evaluate for his anemia performed by Dr. Amedeo Plenty and this was normal. Abdominal CT scan performed on this admission revealed severe wall thickening with surrounding inflammation in the terminal ileum consistent with Crohns disease. There was dilatation and partial obstruction of the more proximal small bowel and mesenteric adenopathy which was thought to be inflammatory in origin.  This morning, patient feels better. His abdominal pain is significantly improved. He is being tried on clears. No vomiting, nausea or diarrhea.  Past Medical History  Diagnosis Date  . 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. Iron deficiency anemia   . GERD  (gastroesophageal reflux disease)   . ANEMIA-IRON DEFICIENCY 04/02/2009  . Protein-calorie malnutrition, severe 04/15/2013  . GI bleed 04/22/2013    EGD by Dr Teena Irani unremarkable  . Crohn's disease 2010    by colon:cecum and ascending colon, by EGD: duodenum, by imaging also in ileum. No villous atrophy on duodenal biopsies.   . SBO (small bowel obstruction) 2013    in [place] in area of active Crohn's.    Past Surgical History  Procedure Laterality Date  . Esophagogastroduodenoscopy  Oct 2010    Dr. Fuller Plan: duodenitis, normal villi, likely Crohn's. Elevated Gliadin but normal TTG, IgA and IgA  . Colonoscopy  Oct 2010    Dr. Fuller Plan: evidence of Crohn's at cecum, ascending colon, unable to intubate the terminal ileum. CTE with distal and terminal ileitis  . Esophagogastroduodenoscopy N/A 04/24/2013    Procedure: ESOPHAGOGASTRODUODENOSCOPY (EGD);  Surgeon: Missy Sabins, MD;  Location: [facility], [place] ENDOSCOPY;  Service: Endoscopy;  Laterality: N/A;   Family History  Problem Relation Age of Onset  . Diabetes Maternal Aunt   . Diabetes Maternal Grandmother   . Colon cancer Neg Hx   . Crohn's disease Maternal Aunt   . Hypertension     Social History:  reports that he has quit smoking. His smoking use included Cigarettes. He has a 2 pack-year smoking history. He has never used smokeless tobacco. He reports that he drinks alcohol. He reports that he uses illicit drugs (Marijuana).  Allergies:  Allergies  Allergen Reactions  . Gluten Meal Other (See Comments)    Stomach pain  . Wheat Bran Other (See Comments)    Belly pain  Medications:  Prior to Admission:  Prescriptions prior to admission  Medication Sig Dispense Refill Last Dose  . predniSONE (DELTASONE) 20 MG tablet Take 2 tablets twice daily for 3 days, then take 2 tablets once daily for 4 days, then take 1 tablet once daily for 10 days, then STOP. (Patient not taking: Reported on 08/04/2014) 30 tablet 0 More than a month at Unknown time    Scheduled: . antiseptic oral rinse  7 mL Mouth Rinse q12n4p  . chlorhexidine  15 mL Mouth Rinse BID  . enoxaparin (LOVENOX) injection  40 mg Subcutaneous Q24H  . methylPREDNISolone (SOLU-MEDROL) injection  60 mg Intravenous Daily  . pantoprazole (PROTONIX) IV  40 mg Intravenous Q24H  . sodium chloride  3 mL Intravenous Q12H   Continuous: . sodium chloride 125 mL/hr at 08/04/14 2316   YBW:LSLHTDSKAJGOT (DILAUDID) injection, ondansetron (ZOFRAN) IV  Results for orders placed or performed during the hospital encounter of 08/04/14 (from the past 48 hour(s))  Comprehensive metabolic panel     Status: Abnormal   Collection Time: 08/04/14 11:39 AM  Result Value Ref Range   Sodium 137 135 - 145 mmol/L   Potassium 4.1 3.5 - 5.1 mmol/L   Chloride 107 96 - 112 mmol/L   CO2 22 19 - 32 mmol/L   Glucose, Bld 89 70 - 99 mg/dL   BUN 11 6 - 23 mg/dL   Creatinine, Ser 0.89 0.50 - 1.35 mg/dL   Calcium 8.6 8.4 - 10.5 mg/dL   Total Protein 6.3 6.0 - 8.3 g/dL   Albumin 2.6 (L) 3.5 - 5.2 g/dL   AST 20 0 - 37 U/L   ALT 12 0 - 53 U/L   Alkaline Phosphatase 61 39 - 117 U/L   Total Bilirubin 0.6 0.3 - 1.2 mg/dL   GFR calc non Af Amer >90 >90 mL/min   GFR calc Af Amer >90 >90 mL/min    Comment: (NOTE) The eGFR has been calculated using the CKD EPI equation. This calculation has not been validated in all clinical situations. eGFR's persistently <90 mL/min signify possible Chronic Kidney Disease.    Anion gap 8 5 - 15  CBC with Differential     Status: Abnormal   Collection Time: 08/04/14 11:39 AM  Result Value Ref Range   WBC 7.3 4.0 - 10.5 K/uL   RBC 4.38 4.22 - 5.81 MIL/uL   Hemoglobin 8.7 (L) 13.0 - 17.0 g/dL   HCT 28.7 (L) 39.0 - 52.0 %   MCV 65.5 (L) 78.0 - 100.0 fL   MCH 19.9 (L) 26.0 - 34.0 pg   MCHC 30.3 30.0 - 36.0 g/dL   RDW 17.4 (H) 11.5 - 15.5 %   Platelets 540 (H) 150 - 400 K/uL   Neutrophils Relative % 61 43 - 77 %   Lymphocytes Relative 16 12 - 46 %   Monocytes Relative 17  (H) 3 - 12 %   Eosinophils Relative 6 (H) 0 - 5 %   Basophils Relative 0 0 - 1 %   Neutro Abs 4.5 1.7 - 7.7 K/uL   Lymphs Abs 1.2 0.7 - 4.0 K/uL   Monocytes Absolute 1.2 (H) 0.1 - 1.0 K/uL   Eosinophils Absolute 0.4 0.0 - 0.7 K/uL   Basophils Absolute 0.0 0.0 - 0.1 K/uL   RBC Morphology POLYCHROMASIA PRESENT     Comment: [employer] CELLS TEARDROP CELLS   Lactic acid, plasma     Status: None   Collection Time: 08/04/14  9:05 PM  Result  Value Ref Range   Lactic Acid, Venous 0.5 0.5 - 2.0 mmol/L  Procalcitonin - Baseline     Status: None   Collection Time: 08/04/14  9:05 PM  Result Value Ref Range   Procalcitonin <0.10 ng/mL    Comment:        Interpretation: PCT (Procalcitonin) <= 0.5 ng/mL: Systemic infection (sepsis) is not likely. Local bacterial infection is possible. (NOTE)         ICU PCT Algorithm               Non ICU PCT Algorithm    ----------------------------     ------------------------------         PCT < 0.25 ng/mL                 PCT < 0.1 ng/mL     Stopping of antibiotics            Stopping of antibiotics       strongly encouraged.               strongly encouraged.    ----------------------------     ------------------------------       PCT level decrease by               PCT < 0.25 ng/mL       >= 80% from peak PCT       OR PCT 0.25 - 0.5 ng/mL          Stopping of antibiotics                                             encouraged.     Stopping of antibiotics           encouraged.    ----------------------------     ------------------------------       PCT level decrease by              PCT >= 0.25 ng/mL       < 80% from peak PCT        AND PCT >= 0.5 ng/mL            Continuin g antibiotics                                              encouraged.       Continuing antibiotics            encouraged.    ----------------------------     ------------------------------     PCT level increase compared          PCT > 0.5 ng/mL         with peak PCT AND           PCT >= 0.5 ng/mL             Escalation of antibiotics                                          strongly encouraged.      Escalation of antibiotics        strongly encouraged.   Urinalysis, Routine w reflex microscopic  Status: Abnormal   Collection Time: 08/04/14 11:12 PM  Result Value Ref Range   Color, Urine YELLOW YELLOW   APPearance CLEAR CLEAR   Specific Gravity, Urine >1.046 (H) 1.005 - 1.030   pH 5.5 5.0 - 8.0   Glucose, UA NEGATIVE NEGATIVE mg/dL   Hgb urine dipstick NEGATIVE NEGATIVE   Bilirubin Urine NEGATIVE NEGATIVE   Ketones, ur NEGATIVE NEGATIVE mg/dL   Protein, ur NEGATIVE NEGATIVE mg/dL   Urobilinogen, UA 0.2 0.0 - 1.0 mg/dL   Nitrite NEGATIVE NEGATIVE   Leukocytes, UA NEGATIVE NEGATIVE    Comment: MICROSCOPIC NOT DONE ON URINES WITH NEGATIVE PROTEIN, BLOOD, LEUKOCYTES, NITRITE, OR GLUCOSE <1000 mg/dL.  Urine rapid drug screen (hosp performed)     Status: Abnormal   Collection Time: 08/04/14 11:12 PM  Result Value Ref Range   Opiates POSITIVE (A) NONE DETECTED   Cocaine NONE DETECTED NONE DETECTED   Benzodiazepines NONE DETECTED NONE DETECTED   Amphetamines NONE DETECTED NONE DETECTED   Tetrahydrocannabinol POSITIVE (A) NONE DETECTED   Barbiturates NONE DETECTED NONE DETECTED    Comment:        DRUG SCREEN FOR MEDICAL PURPOSES ONLY.  IF CONFIRMATION IS NEEDED FOR ANY PURPOSE, NOTIFY LAB WITHIN 5 DAYS.        LOWEST DETECTABLE LIMITS FOR URINE DRUG SCREEN Drug Class       Cutoff (ng/mL) Amphetamine      1000 Barbiturate      200 Benzodiazepine   818 Tricyclics       299 Opiates          300 Cocaine          300 THC              50   Clostridium Difficile by PCR     Status: None   Collection Time: 08/05/14  1:52 AM  Result Value Ref Range   C difficile by pcr NEGATIVE NEGATIVE  Vitamin B12     Status: None   Collection Time: 08/05/14  6:04 AM  Result Value Ref Range   Vitamin B-12 388 211 - 911 pg/mL    Comment: Performed at [facility]  Folate     Status: None   Collection Time: 08/05/14  6:04 AM  Result Value Ref Range   Folate >20.0 ng/mL    Comment: (NOTE) Reference Ranges        Deficient:       0.4 - 3.3 ng/mL        Indeterminate:   3.4 - 5.4 ng/mL        Normal:              > 5.4 ng/mL Performed at [facility]   Iron and TIBC     Status: Abnormal   Collection Time: 08/05/14  6:04 AM  Result Value Ref Range   Iron 12 (L) 42 - 165 ug/dL   TIBC 282 215 - 435 ug/dL   Saturation Ratios 4 (L) 20 - 55 %   UIBC 270 125 - 400 ug/dL    Comment: Performed at [facility]  Ferritin     Status: None   Collection Time: 08/05/14  6:04 AM  Result Value Ref Range   Ferritin 200 22 - 322 ng/mL    Comment: Performed at [facility]  Reticulocytes     Status: Abnormal   Collection Time: 08/05/14  6:04 AM  Result Value Ref Range   Retic Ct Pct 1.1  0.4 - 3.1 %   RBC. 4.03 (L) 4.22 - 5.81 MIL/uL   Retic Count, Manual 44.3 19.0 - 186.0 K/uL  Basic metabolic panel     Status: Abnormal   Collection Time: 08/05/14  6:04 AM  Result Value Ref Range   Sodium 135 135 - 145 mmol/L   Potassium 4.3 3.5 - 5.1 mmol/L   Chloride 108 96 - 112 mmol/L   CO2 23 19 - 32 mmol/L   Glucose, Bld 111 (H) 70 - 99 mg/dL   BUN 10 6 - 23 mg/dL   Creatinine, Ser 0.79 0.50 - 1.35 mg/dL   Calcium 8.1 (L) 8.4 - 10.5 mg/dL   GFR calc non Af Amer >90 >90 mL/min   GFR calc Af Amer >90 >90 mL/min    Comment: (NOTE) The eGFR has been calculated using the CKD EPI equation. This calculation has not been validated in all clinical situations. eGFR's persistently <90 mL/min signify possible Chronic Kidney Disease.    Anion gap 4 (L) 5 - 15  Magnesium     Status: None   Collection Time: 08/05/14  6:04 AM  Result Value Ref Range   Magnesium 1.7 1.5 - 2.5 mg/dL  Phosphorus     Status: Abnormal   Collection Time: 08/05/14  6:04 AM  Result Value Ref Range   Phosphorus 4.9 (H) 2.3 - 4.6 mg/dL  CBC WITH DIFFERENTIAL      Status: Abnormal   Collection Time: 08/05/14  6:04 AM  Result Value Ref Range   WBC 4.7 4.0 - 10.5 K/uL   RBC 4.03 (L) 4.22 - 5.81 MIL/uL   Hemoglobin 8.0 (L) 13.0 - 17.0 g/dL   HCT 26.6 (L) 39.0 - 52.0 %   MCV 66.0 (L) 78.0 - 100.0 fL   MCH 19.9 (L) 26.0 - 34.0 pg   MCHC 30.1 30.0 - 36.0 g/dL   RDW 17.6 (H) 11.5 - 15.5 %   Platelets 523 (H) 150 - 400 K/uL   Neutrophils Relative % 84 (H) 43 - 77 %   Lymphocytes Relative 14 12 - 46 %   Monocytes Relative 2 (L) 3 - 12 %   Eosinophils Relative 0 0 - 5 %   Basophils Relative 0 0 - 1 %   Neutro Abs 3.9 1.7 - 7.7 K/uL   Lymphs Abs 0.7 0.7 - 4.0 K/uL   Monocytes Absolute 0.1 0.1 - 1.0 K/uL   Eosinophils Absolute 0.0 0.0 - 0.7 K/uL   Basophils Absolute 0.0 0.0 - 0.1 K/uL   Smear Review MORPHOLOGY UNREMARKABLE    Ct Abdomen Pelvis W Contrast  08/04/2014   CLINICAL DATA:  Acute left lower quadrant abdominal pain.  Diarrhea.  EXAM: CT ABDOMEN AND PELVIS WITH CONTRAST  TECHNIQUE: Multidetector CT imaging of the abdomen and pelvis was performed using the standard protocol following bolus administration of intravenous contrast.  CONTRAST:  171m OMNIPAQUE IOHEXOL 300 MG/ML  SOLN  COMPARISON:  None.  FINDINGS: Visualized lung bases appear normal. No significant osseous abnormality is noted.  No gallstones are noted. The liver, spleen and pancreas appear normal. Adrenal glands and kidneys appear normal. No hydronephrosis or renal obstruction is noted.  There is severe wall thickening and surrounding inflammation involving the terminal ileum consistent with Crohn's disease. This results in dilatation of the more proximal small bowel. Contrast is noted in the colon. Urinary bladder is decompressed. Small amount of free fluid is noted in the dependent portion of the urinary bladder. Enlarged mesenteric lymph nodes are  noted which most likely are inflammatory in origin.  IMPRESSION: Severe wall thickening with surrounding inflammation is seen involving the  terminal ileum consistent with Crohn's disease. This results in dilatation and partial obstruction of the more proximal small bowel. Mesenteric adenopathy is noted which most likely is inflammatory in origin.   Electronically Signed   By: Marijo Conception, M.D.   On: 08/04/2014 20:13   ROS:  As stated above in the HPI otherwise negative.  Blood pressure 117/54, pulse 63, temperature 98.3 F (36.8 C), temperature source Oral, resp. rate 18, height _0  (1.778 m), weight 180 lb 5.4 oz (81.8 kg), SpO2 99 %.  PE: Gen: Alert and Oriented. Not in acute distress. HEENT:  [place]/AT, EOMI Neck: Supple, no LAD Lungs: CTA Bilaterally CV: RRR without M/G/R ABM: Increased bowel sounds. Mild tenderness in the lower abdomen bilaterally. Otherwise soft abdomen without rebound or guarding. Ext: No C/C/E  Assessment/Plan: This is a 24 year old AA gentleman with past medical history of Crohn's disease, and iron def anemia and medication noncompliance who presents with partial small bowel obstruction in the setting of Crohn disease flare.  Crohn's disease: Biopsy proven since 2010. Presentation consistent with moderate flare of Crohn's disease. He has issues with compliance with medications. Has been on Sulfasalazine and Budesonide in the past. Management of his disease has been complicated with his social situation including incarceration for over a year.  Plan - Counseled the patient on the importance of medication compliancy. - due to small bowel involvement, 5-ASAs will be less effective. May need change to 6-MP or Azathiopurine.  - Check TPMT enzyme level before trial of Azathiopurine or 6-MP - Check TB quantiferon and CXR to rule out latent TB before consideration of biological agents.  - change IV Solu-Medrol to PO Prednisone 60 mg daily with a gradual tapering by 5 mg/week  - Outpatient follow up in 2-4 weeks  Small bowel partial obstruction: Improving. Advance diet as tolerable.  Iron deficiency  anemia: Hgb 8.7 which around his baseline. Cont with PO iron supplementation but this again will be difficult with medication and compliancy.  Medication noncompliance: I counseled the patient on this.  Discussed with my attending, Dr. Collene Mares  Signed:  Jessee Avers, MD PGY-3 Internal Medicine Teaching Service Pager: [phone] 08/05/2014, 1:55 PM   Patient seen and discussed with Dr. Jessee Avers.Ordered TPMT levels along with TB Gold. Patient tells me he has been on Imuran before and that he received it for several months while he was in prison. He will need to be re-started on Imuran 100 mg per day. However non-compliance will pose a BIG problem in his case and I have had a detailed discussion with him about the need to follow his doctors recommendations.Juanita Craver, MD 6:40 PM 08/05/2014

## 2014-08-05 NOTE — Progress Notes (Addendum)
Subjective:    Currently, the patient reports improvement of his abdominal pain. He reports having a bowel movement this morning without issue. He says he is hungry and would like to try to eat.  Interval Events: -Vital signs stable overnight, afebrile.  -NG tube could not be inserted.    Objective:    Vital Signs:   Temp:  [98.3 F (36.8 C)-100 F (37.8 C)] 98.3 F (36.8 C) (04/11 0511) Pulse Rate:  [63-95] 63 (04/11 0511) Resp:  [16-18] 18 (04/11 0511) BP: (117-128)/(54-74) 117/54 mmHg (04/11 0511) SpO2:  [97 %-100 %] 99 % (04/11 0511) Weight:  [180 lb 5.4 oz (81.8 kg)] 180 lb 5.4 oz (81.8 kg) (04/11 0511) Last BM Date: 08/04/14  24-hour weight change: Weight change:   Intake/Output:   Intake/Output Summary (Last 24 hours) at 08/05/14 1423 Last data filed at 08/05/14 1300  Gross per 24 hour  Intake 2377.92 ml  Output    950 ml  Net 1427.92 ml      Physical Exam: General: Well-developed, well-nourished, in no acute distress; alert, appropriate and cooperative throughout examination.  Lungs:  Normal respiratory effort. Clear to auscultation BL without crackles or wheezes.  Heart: RRR. S1 and S2 normal without gallop, murmur, or rubs.  Abdomen:  Mild abdominal tenderness, bowel sounds present.  Extremities: No pretibial edema.     Labs:  Basic Metabolic Panel:  Recent Labs Lab 08/04/14 1139 08/05/14 0604  NA 137 135  K 4.1 4.3  CL 107 108  CO2 22 23  GLUCOSE 89 111*  BUN 11 10  CREATININE 0.89 0.79  CALCIUM 8.6 8.1*  MG  --  1.7  PHOS  --  4.9*    Liver Function Tests:  Recent Labs Lab 08/04/14 1139  AST 20  ALT 12  ALKPHOS 61  BILITOT 0.6  PROT 6.3  ALBUMIN 2.6*   CBC:  Recent Labs Lab 08/04/14 1139 08/05/14 0604  WBC 7.3 4.7  NEUTROABS 4.5 3.9  HGB 8.7* 8.0*  HCT 28.7* 26.6*  MCV 65.5* 66.0*  PLT 540* 523*   Microbiology: Results for orders placed or performed during the hospital encounter of 08/04/14  Clostridium  Difficile by PCR     Status: None   Collection Time: 08/05/14  1:52 AM  Result Value Ref Range Status   C difficile by pcr NEGATIVE NEGATIVE Final   Imaging: Ct Abdomen Pelvis W Contrast  08/04/2014   CLINICAL DATA:  Acute left lower quadrant abdominal pain.  Diarrhea.  EXAM: CT ABDOMEN AND PELVIS WITH CONTRAST  TECHNIQUE: Multidetector CT imaging of the abdomen and pelvis was performed using the standard protocol following bolus administration of intravenous contrast.  CONTRAST:  135mL OMNIPAQUE IOHEXOL 300 MG/ML  SOLN  COMPARISON:  None.  FINDINGS: Visualized lung bases appear normal. No significant osseous abnormality is noted.  No gallstones are noted. The liver, spleen and pancreas appear normal. Adrenal glands and kidneys appear normal. No hydronephrosis or renal obstruction is noted.  There is severe wall thickening and surrounding inflammation involving the terminal ileum consistent with Crohn's disease. This results in dilatation of the more proximal small bowel. Contrast is noted in the colon. Urinary bladder is decompressed. Small amount of free fluid is noted in the dependent portion of the urinary bladder. Enlarged mesenteric lymph nodes are noted which most likely are inflammatory in origin.  IMPRESSION: Severe wall thickening with surrounding inflammation is seen involving the terminal ileum consistent with Crohn's disease. This results in dilatation and partial  obstruction of the more proximal small bowel. Mesenteric adenopathy is noted which most likely is inflammatory in origin.   Electronically Signed   By: Marijo Conception, M.D.   On: 08/04/2014 20:13       Medications:    Infusions: . sodium chloride 125 mL/hr at 08/04/14 2316    Scheduled Medications: . antiseptic oral rinse  7 mL Mouth Rinse q12n4p  . chlorhexidine  15 mL Mouth Rinse BID  . enoxaparin (LOVENOX) injection  40 mg Subcutaneous Q24H  . methylPREDNISolone (SOLU-MEDROL) injection  60 mg Intravenous Daily  .  pantoprazole (PROTONIX) IV  40 mg Intravenous Q24H  . sodium chloride  3 mL Intravenous Q12H    PRN Medications: HYDROmorphone (DILAUDID) injection, ondansetron (ZOFRAN) IV   Assessment/ Plan:    Principal Problem:   Partial small bowel obstruction Active Problems:   Iron deficiency anemia   CROHN'S DISEASE-LARGE & SMALL INTESTINE   Crohn's disease with intestinal obstruction   GERD (gastroesophageal reflux disease)   Protein-calorie malnutrition, severe   Tobacco use   Abdominal pain   Thrombocytosis   Microcytic anemia   Abdominal pain, left lower quadrant  #Partial small bowel obstruction secondary to Crohn's flare Patient's pain has significantly improved, and he had a normal bowel movement this morning. He has been noncompliant with his Crohn's therapy in the past. He was previously fired by [employer] in 2014. He appears to have responded to the IV steroids. Discussed with Dr. Collene Mares of GI who will see the patient. She recommends 60 mg of Solu-Medrol today, and transitioning to prednisone when tolerated. No need for NG tube currently. Lactic acid and pro-calcitonin normal. C. difficile negative. -Consulted GI, appreciate recommendations. -Advance diet to clears and further as tolerated. -Solu-Medrol 60 mg IV today. Transition to prednisone tomorrow if tolerates by mouth. -Stop IV fluids now that he is eating. -Discontinue telemetry, no clear indication. -0.5 mg Dilaudid IV every 8 hours as needed. -Zofran 4 mg IV every 6 hours as needed. -Continue Protonix 40 mg IV daily. -Discontinue enteric precautions. -We'll range outpatient GI follow-up.  #Iron deficiency anemia Most likely due to Crohn's disease. Hemoglobin stable. Noncompliant with iron supplementation. Ferritin elevated, but likely due to acute inflammation. Iron low along with low saturation. MCV of 66. Folate and B-12 normal. -Start ferrous sulfate 325 mg daily.  #Thrombocytosis Secondary to Crohn's and iron  deficiency anemia. -No treatment currently.  #Tobacco and marijuana use UDS positive for opioids which he received in the ER and THC. -Counsel on tobacco and marijuana cessation.   DVT PPX - low molecular weight heparin  CODE STATUS - Full.  CONSULTS PLACED - GI.  DISPO - Disposition is deferred at this time, awaiting improvement of Crohn's flare.   Anticipated discharge in approximately 1-3 day(s).   The patient does not have a current PCP (No PCP Per Patient) and does need an [facility] hospital follow-up appointment after discharge.    Is the [facility] hospital follow-up appointment a one-time only appointment? yes.  Does the patient have transportation limitations that hinder transportation to clinic appointments? no   SERVICE NEEDED AT [place]         Y = Yes, Blank = No PT:   OT:   RN:   Equipment:   Other:      Length of Stay: 1 day(s)   Signed: Charlesetta Shanks, MD  PGY-1, Internal Medicine Resident Pager: [phone] (7AM-5PM) 08/05/2014, 2:23 PM

## 2014-08-05 NOTE — Progress Notes (Signed)
NURSING PROGRESS NOTE  Shane Klein [id number] Admission Data: 08/05/2014 7:34 AM Attending Provider: Aldine Contes, MD PCP:No PCP Per Patient Code Status: Full  Shane Klein is a 24 y.o. male patient admitted from ED:  -No acute distress noted.  -No complaints of shortness of breath.  -No complaints of chest pain.   Cardiac Monitoring: Box # 20 in place. Cardiac monitor yields:normal sinus rhythm.  Blood pressure 123/68, pulse 94, temperature 100.0 F (36.8 C), temperature source Oral, resp. rate 18, height 5\' 10"  (1.778 m), weight 81.8 kg (180 lb 5.4 oz), SpO2 98 % room air.  IV Fluids:  IV in place, occlusive dsg intact without redness, IV cath antecubital right, condition patent and no redness normal saline @ 111ml/hr.   Allergies:  Gluten meal and Wheat bran  Past Medical History:   has a past medical history of Silicatosis; Crohn's disease with intestinal obstruction (03/01/2012); Iron deficiency anemia; GERD (gastroesophageal reflux disease); ANEMIA-IRON DEFICIENCY (04/02/2009); DUODENITIS (04/02/2009); Protein-calorie malnutrition, severe (04/15/2013); and GI bleed (04/22/2013).  Past Surgical History:   has past surgical history that includes Esophagogastroduodenoscopy (Oct 2010); Colonoscopy (Oct 2010); and Esophagogastroduodenoscopy (N/A, 04/24/2013).  Social History:   reports that he has quit smoking. His smoking use included Cigarettes. He has a 2 pack-year smoking history. He has never used smokeless tobacco. He reports that he drinks alcohol. He reports that he uses illicit drugs (Marijuana).  Skin: Intact  Patient/Family orientated to room. Information packet given to patient/family. Admission inpatient armband information verified with patient/family to include name and date of birth and placed on patient arm. Side rails up x 2, fall assessment and education completed with patient/family. Patient/family able to verbalize understanding of risk associated with  falls and verbalized understanding to call for assistance before getting out of bed. Call light within reach. Patient/family able to voice and demonstrate understanding of unit orientation instructions.

## 2014-08-06 LAB — MRSA PCR SCREENING: MRSA BY PCR: NEGATIVE

## 2014-08-06 MED ORDER — PREDNISONE 20 MG PO TABS: ORAL_TABLET | ORAL | Status: DC

## 2014-08-06 MED ORDER — FERROUS SULFATE 325 (65 FE) MG PO TABS: 325.0000 mg | ORAL_TABLET | Freq: Every day | ORAL | Status: DC

## 2014-08-06 MED ORDER — PANTOPRAZOLE SODIUM 40 MG PO TBEC: 40.0000 mg | DELAYED_RELEASE_TABLET | Freq: Every day | ORAL | Status: DC

## 2014-08-06 NOTE — Progress Notes (Signed)
NURSING PROGRESS NOTE  Shane Klein [id number] Discharge Data: 08/06/2014 4:18 PM Attending Provider: Aldine Contes, MD PCP:No PCP Per Patient     [name] to be D/C'd Home per MD order.  Discussed with the patient the After Visit Summary and all questions fully answered. All IV's discontinued with no bleeding noted. All belongings returned to patient for patient to take home.   Last Vital Signs:  Blood pressure 121/63, pulse 55, temperature 98.1 F (36.7 C), temperature source Oral, resp. rate 18, height 5\' 10"  (1.778 m), weight 81.5 kg (179 lb 10.8 oz), SpO2 100 %.  Discharge Medication List   Medication List    TAKE these medications        ferrous sulfate 325 (65 FE) MG tablet  Take 1 tablet (325 mg total) by mouth daily with breakfast.     oxyCODONE 5 MG immediate release tablet  Commonly known as:  Oxy IR/ROXICODONE  Take 1 tablet (5 mg total) by mouth every 4 (four) hours as needed for severe pain.     predniSONE 20 MG tablet  Commonly known as:  DELTASONE  Take 3 tablets for 5 days, then take 2.5 tablets for 5 days, then take 2 tablets for 5 days, then take 1 tablet for 5 days, then take 1/2 tablet for 5 days, then stop.         Wallie Renshaw, RN

## 2014-08-06 NOTE — Care Management Note (Signed)
    Page 1 of 1   08/06/2014     12:44:08 PM CARE MANAGEMENT NOTE 08/06/2014  Patient:  Shane Klein, Shane Klein   Account Number:  [id number]  Date Initiated:  08/06/2014  Documentation initiated by:  Carles Collet  Subjective/Objective Assessment:   pt with abd pain and crohns     Action/Plan:   will follow for dc needs   Anticipated DC Date:  08/07/2014   Anticipated DC Plan:  HOME/SELF CARE         Choice offered to / List presented to:             Status of service:  Completed, signed off Medicare Important Message given?   (If response is "NO", the following Medicare IM given date fields will be blank) Date Medicare IM given:   Medicare IM given by:   Date Additional Medicare IM given:   Additional Medicare IM given by:    Discharge Disposition:    Per UR Regulation:    If discussed at Long Length of Stay Meetings, dates discussed:    Comments:  08-06-14 Carles Collet RN BSN CM spoke with patient about [facility]. Pt has Family Practice loisted on Medicaid card. Informed pt that if he wanted to change to [facility] he would need to notify Medicaid and then make appointment. Pt stated that he had no medication needs at this time, and no other assistance.

## 2014-08-06 NOTE — Discharge Summary (Signed)
Name: Shane Klein MRN: [id number] DOB: [birth year] 24 y.o. PCP: No Pcp Per Patient ______________________________________________________________  Date of Admission: 08/04/2014 11:05 AM Date of Discharge: 08/06/2014 Attending Physician: Aldine Contes, MD   Discharge Diagnosis: Partial SBO Crohns disease Tobacco abuse  Iron deficiency anemia   Discharge Medications:   Medication List    TAKE these medications        ferrous sulfate 325 (65 FE) MG tablet  Take 1 tablet (325 mg total) by mouth daily with breakfast.     oxyCODONE 5 MG immediate release tablet  Commonly known as:  Oxy IR/ROXICODONE  Take 1 tablet (5 mg total) by mouth every 4 (four) hours as needed for severe pain.     predniSONE 20 MG tablet  Commonly known as:  DELTASONE  Take 3 tablets for 5 days, then take 2.5 tablets for 5 days, then take 2 tablets for 5 days, then take 1 tablet for 5 days, then take 1/2 tablet for 5 days, then stop.        Disposition and follow-up:   ShaneShane Klein was discharged from [facility] in good condition to home.    Please address the following problems post-discharge:  1.Follow up with gastroenterology 2. Smoking cessation  3.Compliance with ferrous sulfate   Labs / imaging needed at time of follow-up: None  Pending labs/ test needing follow-up: TPMT level and quantiferon gold   Follow-up Appointments: Follow-up Information    Follow up with Missy Sabins, MD.   Specialty:  Gastroenterology   Contact information:   [address] [place] [address] [phone]       Follow up with Juanita Craver, MD. Schedule an appointment as soon as possible for a visit in 2 weeks.   Specialty:  Gastroenterology   Contact information:   [address] [place] [address] [phone]       Follow up with Jessee Avers, MD. Go on 08/14/2014.   Specialty:  Internal Medicine   Why:  08/14/14 at 10:15 AM   Contact information:   [place] [address] [phone]       Discharge Instructions: Discharge Instructions    Call MD for:  persistant nausea and vomiting    Complete by:  As directed      Call MD for:  severe uncontrolled pain    Complete by:  As directed      Diet - low sodium heart healthy    Complete by:  As directed      Discharge instructions    Complete by:  As directed   Please keep your follow up appointments.  Also, please take prednisone daily as directed on your prescription until finished.  You will also need to take a multivitamin with calcium and vitamin D.  Please continue your iron supplementation.     Increase activity slowly    Complete by:  As directed            Consultations:    Procedures Performed:  Dg Chest 2 View  08/05/2014   CLINICAL DATA:  Pulmonary infection, history silicatosis, Crohn's disease, GERD  EXAM: CHEST  2 VIEW  COMPARISON:  06/21/2013  FINDINGS: Upper normal heart size.  Normal mediastinal contours and pulmonary vascularity.  Minimal central peribronchial thickening, chronic.  Lungs clear.  No pleural effusion or pneumothorax.  Mild thoracolumbar scoliosis.  IMPRESSION: Minimal chronic bronchitic changes without acute infiltrate.   Electronically Signed  By: Lavonia Dana M.D.   On: 08/05/2014 17:24   Ct Abdomen Pelvis W Contrast  08/04/2014   CLINICAL DATA:  Acute left lower quadrant abdominal pain.  Diarrhea.  EXAM: CT ABDOMEN AND PELVIS WITH CONTRAST  TECHNIQUE: Multidetector CT imaging of the abdomen and pelvis was performed using the standard protocol following bolus administration of intravenous contrast.  CONTRAST:  173mL OMNIPAQUE IOHEXOL 300 MG/ML  SOLN  COMPARISON:  None.  FINDINGS: Visualized lung bases appear normal. No significant osseous abnormality is noted.  No gallstones are noted. The liver, spleen and pancreas appear normal. Adrenal glands and kidneys appear normal. No hydronephrosis or renal obstruction is  noted.  There is severe wall thickening and surrounding inflammation involving the terminal ileum consistent with Crohn's disease. This results in dilatation of the more proximal small bowel. Contrast is noted in the colon. Urinary bladder is decompressed. Small amount of free fluid is noted in the dependent portion of the urinary bladder. Enlarged mesenteric lymph nodes are noted which most likely are inflammatory in origin.  IMPRESSION: Severe wall thickening with surrounding inflammation is seen involving the terminal ileum consistent with Crohn's disease. This results in dilatation and partial obstruction of the more proximal small bowel. Mesenteric adenopathy is noted which most likely is inflammatory in origin.   Electronically Signed   By: Marijo Conception, M.D.   On: 08/04/2014 20:13    2D Echo: N/A  Cardiac Cath: N/A  Admission HPI:  Shane Klein is a 24 yo man with a history of Crohn's disease, duodenitis, iron deficiency anemia and silicatosis who presented to the ED with abdominal pain since yesterday. He had cramping, left lower quadrant pain that came and went every 2-3 minutes yesterday. The pain worsened to a 10/10 in intensity last night. He feels bloated. He had one loose bowel movement early this morning but denies diarrhea or vomiting. He has not noticed any blood or discoloration of his bowel movements. He has not had anything to eat yesterday or today, but has been drinking water and soda.   Shane Klein has had multiple hospitalizations at [facility] and elsewhere for Crohn's exacerbations with partial small bowel obstruction, most recently at [facility] in 02/2014. He was sent home on a course of prednisone that has now been completed. He has never required surgery. He has seen Dr. Fuller Plan in the past (2014), but has never had consistent follow up for his Crohn's Disease. He has tried budesonide, azathioprine and sulfasalazine in the past.   Original H&P: Drucilla Schmidt,  MD  Hospital Course by problem list: Principal Problem:   Partial small bowel obstruction Active Problems:   CROHN'S DISEASE-LARGE & SMALL INTESTINE   GERD (gastroesophageal reflux disease)   Protein-calorie malnutrition, severe   Tobacco use   Thrombocytosis   Microcytic anemia   Partial Small bowel obstruction Pt initially presented with severe, cramping LLQ abdominal pain associated with a loose bowel movement. This resolved with conservative management with bowel rest, fluids, and steroids. On day of discharge, he had significantly improved with return of bowel function and tolerating a normal diet. He was tapered from IV steroids to an oral prednisone taper prior to discharge.  Crohn's Disease Pt has previously been managed with budesonide, azathiopurine, and sulfasalazine in the past, but currently does not have a GI provider after being dismissed from [place]. He was seen by GI during this admission who emphasized the need for follow-up and medication adherence. A quantiferon gold, TPMT enzyme level,  and CXR were ordered for possible re-initiation of azathiopurine. The CXR showed minimal chronic bronchitic changes but no acute infiltrate. The TPMT level and quantiferon gold were pending at time of discharge.  He will need to follow-up with Dr. Collene Mares with GI after he can establish care with a PCP. Iron Deficiency Anemia Continued anemia noted this admission; iron studies did show a normal ferritin of 200; however this is most likely due to acute inflammation. His MCV was 60 and serum iron was 12. He was re-started on oral iron supplementation.  Tobacco abuse  The patient reports that he understands the relationship between smoking and Crohn's disease, has quit before, and declined further resources for smoking cessation.   Discharge Vitals:   BP 121/63 mmHg  Pulse 55  Temp(Src) 98.1 F (36.7 C) (Oral)  Resp 18  Ht 5\' 10"  (1.778 m)  Wt 179 lb 10.8 oz (81.5 kg)  BMI 25.78 kg/m2   SpO2 100%  Discharge Labs:  No results found for this or any previous visit (from the past 24 hour(s)).  Signed: Jones Bales, MD 08/14/2014, 2:26 PM   Services Ordered on Discharge: None Equipment Ordered on Discharge: None

## 2014-08-06 NOTE — Progress Notes (Signed)
Subjective: [name]. Tolerated clear liquid diet with no nausea, vomiting, or abdominal pain. States that he had a bowel movement overnight. Did not use an PRN dilaudid overnight and states that he did not think he needed any. States that he wants some more food this AM.   States that he has been told previously about how cigarette smoking can worsen Crohn's disease. He reports that he has quite smoking before without any aids and thinks he could do it again. He declines any additional smoking cessation resources or nicotine replacement therapies.   He lives at home with his cousin and grandmother and states he has no difficulties getting his medications or having transportation to clinic appointments.   Objective: Vital signs in last 24 hours: Filed Vitals:   08/05/14 0511 08/05/14 1430 08/06/14 0500 08/06/14 0616  BP: 117/54 130/73  108/59  Pulse: 63   55  Temp: 98.3 F (36.8 C) 98.6 F (37 C)  98 F (36.7 C)  TempSrc: Oral   Oral  Resp: 18 16  18   Height:      Weight: 81.8 kg (180 lb 5.4 oz)  81.5 kg (179 lb 10.8 oz)   SpO2: 99% 100%  99%   Weight change: -0.374 kg (-13.2 oz)  Intake/Output Summary (Last 24 hours) at 08/06/14 0729 Last data filed at 08/05/14 2237  Gross per 24 hour  Intake   1540 ml  Output   2550 ml  Net  -1010 ml   General: Lying in bed in no acute distress; talking on his cell phone.  CV: RRR, no murmurs auscultated Pulm: Normal work of breathing; CTAB bilaterally.  Abdomen: Active bowel sounds, soft, non-tender to palpation  Lab Results: None.   Micro Results: Reviewed in EPIC; please see chart for full details.   Studies/Results: Reviewed in EPIC; please see chart for full details.   Medications:  Scheduled Meds: . antiseptic oral rinse  7 mL Mouth Rinse q12n4p  . chlorhexidine  15 mL Mouth Rinse BID  . enoxaparin (LOVENOX) injection  40 mg Subcutaneous Q24H  . ferrous sulfate  325 mg Oral Q breakfast  . pantoprazole (PROTONIX) IV  40 mg  Intravenous Q24H  . predniSONE  60 mg Oral Q breakfast  . sodium chloride  3 mL Intravenous Q12H   Continuous Infusions:  PRN Meds:.HYDROmorphone (DILAUDID) injection, ondansetron (ZOFRAN) IV   Assessment/Plan:  Shane Klein is a 24 yo male with a PMH significant for Crohn's disease and iron deficiency anemia with multiple flares requiring hospitalization who is admitted for treatment of a Crohn's disease flare complicated by now resolving partial small bowel obstruction.  Principal Problem:   Partial small bowel obstruction Active Problems:   Iron deficiency anemia   CROHN'S DISEASE-LARGE & SMALL INTESTINE   Crohn's disease with intestinal obstruction   GERD (gastroesophageal reflux disease)   Protein-calorie malnutrition, severe   Tobacco use   Abdominal pain   Thrombocytosis   Microcytic anemia   Abdominal pain, left lower quadrant  ** Partial SBO 2/2 Crohn's flare:  -- GI c/s; appreciate reccs -- Will transition from IV solumedrol 60 mg to prednisone 60 mg daily -- Per GI, plan to taper prednisone by 5 mg weekly  -- Will transition from IV pantoprazole to po pantoprazole -- Will dc IV dilaudid as patient is not using it -- Currently on clears; will advance to soft diet today  ** Crohn's disease: Previous hospitalizations for SBO 2/2 crohn's disease. Per GI, there is significant concern for medication non-adherence  and loss to follow-up. Counseled by GI regarding the importance of continued GI management.  -- GI following; appreciate reccs -- TPMT pending, Quantiferon gold pending -- CXR showed chronic bronchitis changes but no evidence of latent TB.  -- Will hold azathiopurine until follow-up with GI   ** Iron deficiency anemia: Most likely due to Crohn's disease. Hemoglobin 8.0 4/11; repeat iron anemia profile shows low serum iron, normal TIBC, and elevated ferritin to 200. Previously prior to admission, ferritin was 12. This most likely represents iron deficiency anemia with  MCV of 66. His ferritin is likely elevated in the setting of acute inflammation.  -- Continue oral iron 325 daily  ** Thrombocytosis: Likely reactive in setting of acute inflammation.   ** Tobacco and marijuana use: Patient declines resources for smoking cessation.   ** Ppx:  DVT: LMWH, SCDs GI: pantoprazole as above  ** FEN/GI:  -- Soft, advance as tolerated as above  ** Dispo:  -- Needs PCP to schedule with Shane Klein in GI clinic; scheduled follow-up with [facility]   This is a Medical Student Note.  The care of the patient was discussed with Shane Klein and the assessment and plan formulated with their assistance.  Please see their attached note for official documentation of the daily encounter.   LOS: 2 days   Shane Klein, Med Student 08/06/2014, 7:29 AM [phone]

## 2014-08-06 NOTE — Progress Notes (Signed)
Subjective:   Day of hospitalization: 2  Pt is sitting up in bed eating jello and requesting a diet.  No N/V or abdominal pain.     Objective:   Vital signs in last 24 hours: Filed Vitals:   08/05/14 1430 08/06/14 0500 08/06/14 0616 08/06/14 1348  BP: 130/73  108/59 121/63  Pulse:   55   Temp: 98.6 F (37 C)  98 F (36.7 C) 98.1 F (36.7 C)  TempSrc:   Oral Oral  Resp: 16  18   Height:      Weight:  179 lb 10.8 oz (81.5 kg)    SpO2: 100%  99% 100%    Weight: Filed Weights   08/04/14 2206 08/05/14 0511 08/06/14 0500  Weight: 180 lb 5.4 oz (81.8 kg) 180 lb 5.4 oz (81.8 kg) 179 lb 10.8 oz (81.5 kg)    I/Os:  Intake/Output Summary (Last 24 hours) at 08/06/14 1650 Last data filed at 08/06/14 0841  Gross per 24 hour  Intake    700 ml  Output   2300 ml  Net  -1600 ml    Physical Exam: Constitutional: Vital signs reviewed.  Patient is sitting up in bed in no acute distress and cooperative with exam.   HEENT: [place]/AT; PERRL, EOMI, conjunctivae normalCardiovascular: RRR, no MRG Pulmonary/Chest: normal respiratory effort, no accessory muscle use, CTAB, no wheezes, rales, or rhonchi Abdominal: Soft. +BS, NT/ND Neurological: A&O x3, CN II-XII grossly intact; non-focal exam Extremities: 2+DP b/l, no C/C/E  Skin: Warm, dry and intact. No rash  Lab Results:  BMP:  Recent Labs Lab 08/04/14 1139 08/05/14 0604  NA 137 135  K 4.1 4.3  CL 107 108  CO2 22 23  GLUCOSE 89 111*  BUN 11 10  CREATININE 0.89 0.79  CALCIUM 8.6 8.1*  MG  --  1.7  PHOS  --  4.9*    CBC:  Recent Labs Lab 08/04/14 1139 08/05/14 0604  WBC 7.3 4.7  NEUTROABS 4.5 3.9  HGB 8.7* 8.0*  HCT 28.7* 26.6*  MCV 65.5* 66.0*  PLT 540* 523*    Coagulation: No results for input(s): LABPROT, INR in the last 168 hours.  CBG:           No results for input(s): GLUCAP in the last 168 hours.         HA1C:      No results for input(s): HGBA1C in the last 168 hours.  Lipid Panel: No results  for input(s): CHOL, HDL, LDLCALC, TRIG, CHOLHDL, LDLDIRECT in the last 168 hours.  LFTs:  Recent Labs Lab 08/04/14 1139  AST 20  ALT 12  ALKPHOS 61  BILITOT 0.6  PROT 6.3  ALBUMIN 2.6*    Pancreatic Enzymes: No results for input(s): LIPASE, AMYLASE in the last 168 hours.  Lactic Acid/Procalcitonin:  Recent Labs Lab 08/04/14 2105  LATICACIDVEN 0.5  PROCALCITON <0.10    Ammonia: No results for input(s): AMMONIA in the last 168 hours.  Cardiac Enzymes: No results for input(s): CKTOTAL, CKMB, CKMBINDEX, TROPONINI in the last 168 hours.  EKG: EKG Interpretation  Date/Time:  Sunday August 04 2014 21:31:38 EDT Ventricular Rate:  97 PR Interval:  135 QRS Duration: 88 QT Interval:  340 QTC Calculation: 432 R Axis:   81 Text Interpretation:  Sinus rhythm ST elev, probable normal early repol pattern Confirmed by ALLEN  MD, ANTHONY ([id number]) on 08/04/2014 9:34:03 PM   BNP: No results for input(s): PROBNP in the last 168 hours.  D-Dimer:  No results for input(s): DDIMER in the last 168 hours.  Urinalysis:  Recent Labs Lab 08/04/14 2312  COLORURINE YELLOW  LABSPEC >1.046*  PHURINE 5.5  GLUCOSEU NEGATIVE  HGBUR NEGATIVE  BILIRUBINUR NEGATIVE  KETONESUR NEGATIVE  PROTEINUR NEGATIVE  UROBILINOGEN 0.2  NITRITE NEGATIVE  LEUKOCYTESUR NEGATIVE    Micro Results: Recent Results (from the past 240 hour(s))  Clostridium Difficile by PCR     Status: None   Collection Time: 08/05/14  1:52 AM  Result Value Ref Range Status   C difficile by pcr NEGATIVE NEGATIVE Final  MRSA PCR Screening     Status: None   Collection Time: 08/06/14 12:52 AM  Result Value Ref Range Status   MRSA by PCR NEGATIVE NEGATIVE Final    Comment:        The GeneXpert MRSA Assay (FDA approved for NASAL specimens only), is one component of a comprehensive MRSA colonization surveillance program. It is not intended to diagnose MRSA infection nor to guide or monitor treatment for MRSA  infections.     Blood Culture:    Component Value Date/Time   SDES URINE, CLEAN CATCH 06/22/2012 1313   SPECREQUEST NONE 06/22/2012 1313   CULT NO GROWTH 06/22/2012 1313   REPTSTATUS 06/24/2012 FINAL 06/22/2012 1313    Studies/Results: Dg Chest 2 View  08/05/2014   CLINICAL DATA:  Pulmonary infection, history silicatosis, Crohn's disease, GERD  EXAM: CHEST  2 VIEW  COMPARISON:  06/21/2013  FINDINGS: Upper normal heart size.  Normal mediastinal contours and pulmonary vascularity.  Minimal central peribronchial thickening, chronic.  Lungs clear.  No pleural effusion or pneumothorax.  Mild thoracolumbar scoliosis.  IMPRESSION: Minimal chronic bronchitic changes without acute infiltrate.   Electronically Signed   By: Lavonia Dana M.D.   On: 08/05/2014 17:24    Medications:  Scheduled Meds: . antiseptic oral rinse  7 mL Mouth Rinse q12n4p  . chlorhexidine  15 mL Mouth Rinse BID  . enoxaparin (LOVENOX) injection  40 mg Subcutaneous Q24H  . ferrous sulfate  325 mg Oral Q breakfast  . pantoprazole  40 mg Oral QHS  . predniSONE  60 mg Oral Q breakfast  . sodium chloride  3 mL Intravenous Q12H   Continuous Infusions:  PRN Meds: HYDROmorphone (DILAUDID) injection, ondansetron (ZOFRAN) IV  Antibiotics: Antibiotics Given (last 72 hours)    None      Day of Hospitalization: 2  Consults:    Assessment/Plan:   Principal Problem:   Partial small bowel obstruction Active Problems:   CROHN'S DISEASE-LARGE & SMALL INTESTINE   GERD (gastroesophageal reflux disease)   Protein-calorie malnutrition, severe   Tobacco use   Thrombocytosis   Microcytic anemia  Partial SBO-  Pt is eating and drinking OK-->able to tolerate po.  Denies N/V, abdominal pain.  GI recommends f/u and likely restarting azathioprine but checking quantiferon gold and TPMT levels.  Recommend prednisone taper starting with 60mg  prednisone today. -d/c today -f/u with GI and IMC  -cont prednisone taper -f/u  labs  Crohn's Disease- Plans per above.  Microcytic Anemia- Hgb 8.0 which is at baseline.  Anemia panel revealed B12, folate wnl.  Iron low, but TIBC wnl.  Ferritin wnl.   -cont ferrous sulfate po qd  Thrombocytosis- Likely d/t anemia and chronic inflammation.   -monitor     LOS: 2 days   Jones Bales, MD PGY-2, Internal Medicine Teaching Service 08/06/2014, 4:50 PM

## 2014-08-07 NOTE — Progress Notes (Signed)
Patient left medication called Azathioprine. Spoke to patient via phone and he stated to dispose of the medication. Medication properly disposed

## 2014-08-09 LAB — QUANTIFERON IN TUBE
QFT TB AG MINUS NIL VALUE: 0 IU/mL
QUANTIFERON MITOGEN VALUE: 0.09 [IU]/mL
QUANTIFERON NIL VALUE: 0.03 [IU]/mL
QUANTIFERON TB AG VALUE: 0.03 IU/mL
QUANTIFERON TB GOLD: UNDETERMINED

## 2014-08-09 LAB — QUANTIFERON TB GOLD ASSAY (BLOOD)

## 2014-08-14 ENCOUNTER — Ambulatory Visit: Payer: Self-pay | Admitting: Internal Medicine

## 2014-08-14 ENCOUNTER — Encounter: Payer: Self-pay | Admitting: Internal Medicine

## 2014-11-08 ENCOUNTER — Emergency Department (HOSPITAL_COMMUNITY)

## 2014-11-08 ENCOUNTER — Emergency Department (HOSPITAL_COMMUNITY)
Admission: EM | Admit: 2014-11-08 | Discharge: 2014-11-09 | Disposition: A | Discharge status: Home / Self Care | Attending: Emergency Medicine | Admitting: Emergency Medicine

## 2014-11-08 ENCOUNTER — Encounter (HOSPITAL_COMMUNITY): Payer: Self-pay | Admitting: *Deleted

## 2014-11-08 DIAGNOSIS — R1032 Left lower quadrant pain: Secondary | ICD-10-CM | POA: Diagnosis present

## 2014-11-08 DIAGNOSIS — D509 Iron deficiency anemia, unspecified: Secondary | ICD-10-CM | POA: Insufficient documentation

## 2014-11-08 DIAGNOSIS — Z87891 Personal history of nicotine dependence: Secondary | ICD-10-CM | POA: Diagnosis not present

## 2014-11-08 DIAGNOSIS — Z8709 Personal history of other diseases of the respiratory system: Secondary | ICD-10-CM | POA: Diagnosis not present

## 2014-11-08 DIAGNOSIS — Z8639 Personal history of other endocrine, nutritional and metabolic disease: Secondary | ICD-10-CM | POA: Diagnosis not present

## 2014-11-08 DIAGNOSIS — K5090 Crohn's disease, unspecified, without complications: Secondary | ICD-10-CM | POA: Insufficient documentation

## 2014-11-08 DIAGNOSIS — K5010 Crohn's disease of large intestine without complications: Secondary | ICD-10-CM

## 2014-11-08 LAB — COMPREHENSIVE METABOLIC PANEL
ALT: 9 U/L — ABNORMAL LOW (ref 17–63)
AST: 12 U/L — ABNORMAL LOW (ref 15–41)
Albumin: 3.1 g/dL — ABNORMAL LOW (ref 3.5–5.0)
Alkaline Phosphatase: 44 U/L (ref 38–126)
Anion gap: 8 (ref 5–15)
BUN: 15 mg/dL (ref 6–20)
CALCIUM: 8.2 mg/dL — AB (ref 8.9–10.3)
CO2: 25 mmol/L (ref 22–32)
CREATININE: 0.81 mg/dL (ref 0.61–1.24)
Chloride: 108 mmol/L (ref 101–111)
GFR calc Af Amer: >60 mL/min (ref >60)
Glucose, Bld: 94 mg/dL (ref 65–99)
POTASSIUM: 3.5 mmol/L (ref 3.5–5.1)
Sodium: 141 mmol/L (ref 135–145)
Total Bilirubin: 0.3 mg/dL (ref 0.3–1.2)
Total Protein: 6.5 g/dL (ref 6.5–8.1)

## 2014-11-08 LAB — CBC WITH DIFFERENTIAL/PLATELET
Basophils Absolute: 0.1 10*3/uL (ref 0.0–0.1)
Basophils Relative: 1 % (ref 0–1)
Eosinophils Absolute: 0.9 10*3/uL — ABNORMAL HIGH (ref 0.0–0.7)
Eosinophils Relative: 10 % — ABNORMAL HIGH (ref 0–5)
HCT: 27.7 % — ABNORMAL LOW (ref 39.0–52.0)
Hemoglobin: 8.7 g/dL — ABNORMAL LOW (ref 13.0–17.0)
Lymphocytes Relative: 19 % (ref 12–46)
Lymphs Abs: 1.8 10*3/uL (ref 0.7–4.0)
MCH: 21.1 pg — ABNORMAL LOW (ref 26.0–34.0)
MCHC: 31.4 g/dL (ref 30.0–36.0)
MCV: 67.1 fL — ABNORMAL LOW (ref 78.0–100.0)
Monocytes Absolute: 1 10*3/uL (ref 0.1–1.0)
Monocytes Relative: 10 % (ref 3–12)
Neutro Abs: 5.6 10*3/uL (ref 1.7–7.7)
Neutrophils Relative %: 60 % (ref 43–77)
Platelets: 548 10*3/uL — ABNORMAL HIGH (ref 150–400)
RBC: 4.13 MIL/uL — ABNORMAL LOW (ref 4.22–5.81)
RDW: 19.7 % — ABNORMAL HIGH (ref 11.5–15.5)
WBC: 9.4 10*3/uL (ref 4.0–10.5)

## 2014-11-08 LAB — LIPASE, BLOOD: Lipase: 26 U/L (ref 22–51)

## 2014-11-08 LAB — URINALYSIS, ROUTINE W REFLEX MICROSCOPIC
Bilirubin Urine: NEGATIVE
GLUCOSE, UA: NEGATIVE mg/dL
HGB URINE DIPSTICK: NEGATIVE
Ketones, ur: NEGATIVE mg/dL
Leukocytes, UA: NEGATIVE
Nitrite: NEGATIVE
PH: 5.5 (ref 5.0–8.0)
PROTEIN: NEGATIVE mg/dL
Urobilinogen, UA: 0.2 mg/dL (ref 0.0–1.0)

## 2014-11-08 MED ORDER — ONDANSETRON 4 MG PO TBDP
4.0000 mg | ORAL_TABLET | Freq: Once | ORAL | Status: AC
  Administered 2014-11-08: 4 mg via ORAL
  Filled 2014-11-08: qty 1

## 2014-11-08 MED ORDER — PREDNISONE 20 MG PO TABS: ORAL_TABLET | ORAL | Status: DC

## 2014-11-08 MED ORDER — PROMETHAZINE HCL 25 MG PO TABS: 25.0000 mg | ORAL_TABLET | Freq: Four times a day (QID) | ORAL | Status: DC | PRN

## 2014-11-08 NOTE — ED Provider Notes (Signed)
CSN: [id number]     Arrival date & time 11/08/14  1907 History  This chart was scribed for Davonna Belling, MD by Irene Pap, ED Scribe. This patient was seen in room APA17/APA17 and patient care was started at 9:15 PM.      Chief Complaint  Patient presents with  . Abdominal Pain   The history is provided by the patient. No language interpreter was used.  HPI Comments: Shane Klein is a 24 y.o. male with hx of Crohn's disease, SBO, GI bleed and GERD who presents to the Emergency Department complaining of left sided abdominal pain onset 3 days ago. Reports that he has not had a BM since Wednesday and that his last BM was diarrhea-like.  States that this feels like his Crohn's disease pain; takes daily medication of ferrous sulfate for Crohn's Disease. Denies fever, chills, nausea, vomiting, dizziness, lightheadedness, abdominal distention, or noticing any bulges or masses in the abdomen. Pt denies allergies to medications.   Past Medical History  Diagnosis Date  . 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. Iron deficiency anemia   . GERD (gastroesophageal reflux disease)   . ANEMIA-IRON DEFICIENCY 04/02/2009  . Protein-calorie malnutrition, severe 04/15/2013  . GI bleed 04/22/2013    EGD by Dr Teena Irani unremarkable  . Crohn's disease 2010    by colon:cecum and ascending colon, by EGD: duodenum, by imaging also in ileum. No villous atrophy on duodenal biopsies.   . SBO (small bowel obstruction) 2013    in TI in area of active Crohn's.    Past Surgical History  Procedure Laterality Date  . Esophagogastroduodenoscopy  Oct 2010    Dr. Fuller Plan: duodenitis, normal villi, likely Crohn's. Elevated Gliadin but normal TTG, IgA and IgA  . Colonoscopy  Oct 2010    Dr. Fuller Plan: evidence of Crohn's at cecum, ascending colon, unable to intubate the terminal ileum. CTE with distal and terminal ileitis  . Esophagogastroduodenoscopy N/A 04/24/2013    Procedure: ESOPHAGOGASTRODUODENOSCOPY (EGD);  Surgeon: Missy Sabins,  MD;  Location: [facility] ENDOSCOPY;  Service: Endoscopy;  Laterality: N/A;   Family History  Problem Relation Age of Onset  . Diabetes Maternal Aunt   . Diabetes Maternal Grandmother   . Colon cancer Neg Hx   . Crohn's disease Maternal Aunt   . Hypertension     History  Substance Use Topics  . Smoking status: Former Smoker -- 1.00 packs/day for 2 years    Types: Cigarettes  . Smokeless tobacco: Never Used  . Alcohol Use: No     Comment: once a week    Review of Systems  Constitutional: Negative for fever and chills.  Gastrointestinal: Positive for abdominal pain. Negative for nausea, vomiting, diarrhea and abdominal distention.  Neurological: Negative for dizziness and light-headedness.   Allergies  Gluten meal and Wheat bran  Home Medications   Prior to Admission medications   Medication Sig Start Date End Date Taking? Authorizing Provider  ibuprofen (ADVIL,MOTRIN) 800 MG tablet Take 800 mg by mouth 3 (three) times daily as needed.   Yes Historical Provider, MD  Multiple Vitamin (MULTIVITAMIN WITH MINERALS) TABS tablet Take 1 tablet by mouth daily.   Yes Historical Provider, MD  penicillin v potassium (VEETID) 500 MG tablet Take 500 mg by mouth 4 (four) times daily.   Yes Historical Provider, MD  sulfaSALAzine (AZULFIDINE) 500 MG tablet Take 1,000 mg by mouth 3 (three) times daily.   Yes Historical Provider, MD  ferrous sulfate 325 (65 FE) MG tablet Take 1 tablet (  325 mg total) by mouth daily with breakfast. Patient not taking: Reported on 11/08/2014 08/06/14   Jones Bales, MD  oxyCODONE (OXY IR/ROXICODONE) 5 MG immediate release tablet Take 1 tablet (5 mg total) by mouth every 4 (four) hours as needed for severe pain. Patient not taking: Reported on 11/08/2014 08/04/14   Carmin Muskrat, MD  predniSONE (DELTASONE) 20 MG tablet Take 3 tablets for 5 days, then take 2.5 tablets for 5 days, then take 2 tablets for 5 days, then take 1 tablet for 5 days, then take 1/2 tablet for 5 days,  then stop. 11/08/14   Davonna Belling, MD  promethazine (PHENERGAN) 25 MG tablet Take 1 tablet (25 mg total) by mouth every 6 (six) hours as needed for nausea. 11/08/14   Davonna Belling, MD   BP 108/72 mmHg  Pulse 67  Temp(Src) 98.3 F (36.8 C) (Oral)  Resp 16  Ht 5' 8"  (1.727 m)  Wt 180 lb (81.647 kg)  BMI 27.38 kg/m2  SpO2 98%  Physical Exam  Constitutional: He is oriented to person, place, and time. He appears well-developed and well-nourished. No distress.  HENT:  Head: Normocephalic and atraumatic.  Eyes: EOM are normal. Pupils are equal, round, and reactive to light.  Sclera not jaundiced  Neck: Normal range of motion. Neck supple.  Cardiovascular: Normal rate and regular rhythm.   Pulmonary/Chest: Effort normal and breath sounds normal.  Abdominal: Soft. There is tenderness in the left lower quadrant.  Mild LLQ tenderness  Musculoskeletal: Normal range of motion.  Neurological: He is alert and oriented to person, place, and time.  Skin: Skin is warm and dry.  Psychiatric: He has a normal mood and affect. His behavior is normal.  Nursing note and vitals reviewed.   ED Course  Procedures (including critical care time) DIAGNOSTIC STUDIES: Oxygen Saturation is 98% on RA, normal by my interpretation.    COORDINATION OF CARE: 9:16 PM-Discussed treatment plan which includes x-ray and labs with pt at bedside and pt agreed to plan.    Labs Review Labs Reviewed  CBC WITH DIFFERENTIAL/PLATELET - Abnormal; Notable for the following:    RBC 4.13 (*)    Hemoglobin 8.7 (*)    HCT 27.7 (*)    MCV 67.1 (*)    MCH 21.1 (*)    RDW 19.7 (*)    Platelets 548 (*)    Eosinophils Relative 10 (*)    Eosinophils Absolute 0.9 (*)    All other components within normal limits  COMPREHENSIVE METABOLIC PANEL - Abnormal; Notable for the following:    Calcium 8.2 (*)    Albumin 3.1 (*)    AST 12 (*)    ALT 9 (*)    All other components within normal limits  URINALYSIS, ROUTINE W  REFLEX MICROSCOPIC (NOT AT [facility]) - Abnormal; Notable for the following:    Specific Gravity, Urine >1.030 (*)    All other components within normal limits  LIPASE, BLOOD    Imaging Review Dg Abd Acute W/chest  11/08/2014   CLINICAL DATA:  24 year old male with left lower quadrant abdominal pain  EXAM: DG ABDOMEN ACUTE W/ 1V CHEST  COMPARISON:  Chest radiograph dated 08/05/2014  FINDINGS: There is no evidence of dilated bowel loops or free intraperitoneal air. No radiopaque calculi or other significant radiographic abnormality is seen. Heart size and mediastinal contours are within normal limits. Both lungs are clear.  IMPRESSION: Negative abdominal radiographs.  No acute cardiopulmonary disease.   Electronically Signed   By:  Anner Crete M.D.   On: 11/08/2014 22:29     EKG Interpretation None      MDM   Final diagnoses:  Crohn's colitis, without complications    Patient with abdominal pain and history of Crohn's disease. Overall well-appearing. Lab work reassuring. Has some tenderness. Will give course of sterile it's and have follow-up with gastroenterology. Patient is a Event organiser. I personally performed the services described in this documentation, which was scribed in my presence. The recorded information has been reviewed and is accurate.     Davonna Belling, MD 11/08/14 [phone]

## 2014-11-08 NOTE — ED Notes (Signed)
Pt c/o abd pain that started Wednesday, denies any n/v/d, fever, states that he is currently taking pcn for dental problems, has had pain like this before and states that he was told that it was inflammation in his stomach,

## 2014-11-08 NOTE — Discharge Instructions (Signed)
Crohn Disease Crohn disease is a long-term (chronic) soreness and redness (inflammation) of the intestines (bowel). It can affect any portion of the digestive tract, from the mouth to the anus. It can also cause problems outside the digestive tract. Crohn disease is closely related to a disease called ulcerative colitis (together, these two diseases are called inflammatory bowel disease).  CAUSES  The cause of Crohn disease is not known. One Link Snuffer is that, in an easily affected person, the immune system is triggered to attack the body's own digestive tissue. Crohn disease runs in families. It seems to be more common in certain geographic areas and amongst certain races. There are no clear-cut dietary causes.  SYMPTOMS  Crohn disease can cause many different symptoms since it can affect many different parts of the body. Symptoms include:  Fatigue.  Weight loss.  Chronic diarrhea, sometime bloody.  Abdominal pain and cramps.  Fever.  Ulcers or canker sores in the mouth or rectum.  Anemia (low red blood cells).  Arthritis, skin problems, and eye problems may occur. Complications of Crohn disease can include:  Series of holes (perforation) of the bowel.  Portions of the intestines sticking to each other (adhesions).  Obstruction of the bowel.  Fistula formation, typically in the rectal area but also in other areas. A fistula is an opening between the bowels and the outside, or between the bowels and another organ.  A painful crack in the mucous membrane of the anus (rectal fissure). DIAGNOSIS  Your caregiver may suspect Crohn disease based on your symptoms and an exam. Blood tests may confirm that there is a problem. You may be asked to submit a stool specimen for examination. X-rays and CT scans may be necessary. Ultimately, the diagnosis is usually made after a procedure that uses a flexible tube that is inserted via your mouth or your anus. This is done under sedation and is called  either an upper endoscopy or colonoscopy. With these tests, the specialist can take tiny tissue samples and remove them from the inside of the bowel (biopsy). Examination of this biopsy tissue under a microscope can reveal Crohn disease as the cause of your symptoms. Due to the many different forms that Crohn disease can take, symptoms may be present for several years before a diagnosis is made. TREATMENT  Medications are often used to decrease inflammation and control the immune system. These include medicines related to aspirin, steroid medications, and newer and stronger medications to slow down the immune system. Some medications may be used as suppositories or enemas. A number of other medications are used or have been studied. Your caregiver will make specific recommendations. HOME CARE INSTRUCTIONS   Symptoms such as diarrhea can be controlled with medications. Avoid foods that have a laxative effect such as fresh fruit, vegetables, and dairy products. During flare-ups, you can rest your bowel by refraining from solid foods. Drink clear liquids frequently during the day. (Electrolyte or rehydrating fluids are best. Your caregiver can help you with suggestions.) Drink often to prevent loss of body fluids (dehydration). When diarrhea has cleared, eat small meals and more frequently. Avoid food additives and stimulants such as caffeine (coffee, tea, or chocolate). Enzyme supplements may help if you develop intolerance to a sugar in dairy products (lactose). Ask your caregiver or dietitian about specific dietary instructions.  Try to maintain a positive attitude. Learn relaxation techniques such as self-hypnosis, mental imaging, and muscle relaxation.  If possible, avoid stresses which can aggravate your condition.  Exercise  regularly.  Follow your diet.  Always get plenty of rest. SEEK MEDICAL CARE IF:   Your symptoms fail to improve after a week or two of new treatment.  You experience  continued weight loss.  You have ongoing cramps or loose bowels.  You develop a new skin rash, skin sores, or eye problems. SEEK IMMEDIATE MEDICAL CARE IF:   You have worsening of your symptoms or develop new symptoms.  You have a fever.  You develop bloody diarrhea.  You develop severe abdominal pain. MAKE SURE YOU:   Understand these instructions.  Will watch your condition.  Will get help right away if you are not doing well or get worse. Document Released: 01/20/2005 Document Revised: 08/27/2013 Document Reviewed: 12/19/2006 [facility] Patient Information 2015 [place], [place]. This information is not intended to replace advice given to you by your health care provider. Make sure you discuss any questions you have with your health care provider.

## 2014-12-20 ENCOUNTER — Encounter (HOSPITAL_COMMUNITY): Payer: Self-pay | Admitting: Emergency Medicine

## 2014-12-20 ENCOUNTER — Emergency Department (HOSPITAL_COMMUNITY)

## 2014-12-20 DIAGNOSIS — D509 Iron deficiency anemia, unspecified: Secondary | ICD-10-CM | POA: Insufficient documentation

## 2014-12-20 DIAGNOSIS — Z792 Long term (current) use of antibiotics: Secondary | ICD-10-CM | POA: Insufficient documentation

## 2014-12-20 DIAGNOSIS — J159 Unspecified bacterial pneumonia: Secondary | ICD-10-CM | POA: Insufficient documentation

## 2014-12-20 DIAGNOSIS — Z720 Tobacco use: Secondary | ICD-10-CM | POA: Insufficient documentation

## 2014-12-20 DIAGNOSIS — R000 Tachycardia, unspecified: Secondary | ICD-10-CM | POA: Insufficient documentation

## 2014-12-20 DIAGNOSIS — K5090 Crohn's disease, unspecified, without complications: Secondary | ICD-10-CM | POA: Insufficient documentation

## 2014-12-20 DIAGNOSIS — Z79899 Other long term (current) drug therapy: Secondary | ICD-10-CM | POA: Insufficient documentation

## 2014-12-20 DIAGNOSIS — K219 Gastro-esophageal reflux disease without esophagitis: Secondary | ICD-10-CM | POA: Insufficient documentation

## 2014-12-20 LAB — CBC
HEMATOCRIT: 30.6 % — AB (ref 39.0–52.0)
Hemoglobin: 9.5 g/dL — ABNORMAL LOW (ref 13.0–17.0)
MCH: 20.9 pg — AB (ref 26.0–34.0)
MCHC: 31 g/dL (ref 30.0–36.0)
MCV: 67.4 fL — AB (ref 78.0–100.0)
PLATELETS: 530 10*3/uL — AB (ref 150–400)
RBC: 4.54 MIL/uL (ref 4.22–5.81)
RDW: 17.6 % — ABNORMAL HIGH (ref 11.5–15.5)
WBC: 10 10*3/uL (ref 4.0–10.5)

## 2014-12-20 LAB — COMPREHENSIVE METABOLIC PANEL
ALBUMIN: 2.9 g/dL — AB (ref 3.5–5.0)
ALT: 11 U/L — AB (ref 17–63)
AST: 16 U/L (ref 15–41)
Alkaline Phosphatase: 60 U/L (ref 38–126)
Anion gap: 8 (ref 5–15)
BUN: 12 mg/dL (ref 6–20)
CHLORIDE: 107 mmol/L (ref 101–111)
CO2: 25 mmol/L (ref 22–32)
CREATININE: 1.14 mg/dL (ref 0.61–1.24)
Calcium: 8.7 mg/dL — ABNORMAL LOW (ref 8.9–10.3)
GFR calc Af Amer: >60 mL/min (ref >60)
GFR calc non Af Amer: >60 mL/min (ref >60)
Glucose, Bld: 107 mg/dL — ABNORMAL HIGH (ref 65–99)
POTASSIUM: 4.2 mmol/L (ref 3.5–5.1)
SODIUM: 140 mmol/L (ref 135–145)
Total Bilirubin: 0.5 mg/dL (ref 0.3–1.2)
Total Protein: 6.6 g/dL (ref 6.5–8.1)

## 2014-12-20 LAB — LIPASE, BLOOD: LIPASE: 16 U/L — AB (ref 22–51)

## 2014-12-20 MED ORDER — ALBUTEROL SULFATE (2.5 MG/3ML) 0.083% IN NEBU
INHALATION_SOLUTION | RESPIRATORY_TRACT | Status: AC
  Administered 2014-12-20: 5 mg via RESPIRATORY_TRACT
  Filled 2014-12-20: qty 6

## 2014-12-20 MED ORDER — ALBUTEROL SULFATE (2.5 MG/3ML) 0.083% IN NEBU
5.0000 mg | INHALATION_SOLUTION | Freq: Once | RESPIRATORY_TRACT | Status: AC
  Administered 2014-12-20: 5 mg via RESPIRATORY_TRACT

## 2014-12-20 NOTE — ED Notes (Signed)
Patient here via EMS with complaint of upper respiratory illness, diarrhea, and abdominal pain. States that about 2 days ago he began to have the upper respiratory symptoms of sore throat, congestion, and productive cough. Shortly after that he began to have abdominal pain and diarrhea; hx of crohn's disease. Reports that abdominal symptoms are consistent with flare up of crohn's. Bilateral breath sounds diminished with wheezing noted; denies history of asthma or previous need for inhalers or nebulizer.

## 2014-12-21 ENCOUNTER — Emergency Department (HOSPITAL_COMMUNITY)
Admission: EM | Admit: 2014-12-21 | Discharge: 2014-12-21 | Disposition: A | Discharge status: Home / Self Care | Attending: Emergency Medicine | Admitting: Emergency Medicine

## 2014-12-21 DIAGNOSIS — K5010 Crohn's disease of large intestine without complications: Secondary | ICD-10-CM

## 2014-12-21 DIAGNOSIS — J189 Pneumonia, unspecified organism: Secondary | ICD-10-CM

## 2014-12-21 MED ORDER — PREDNISONE 20 MG PO TABS
60.0000 mg | ORAL_TABLET | Freq: Once | ORAL | Status: AC
  Administered 2014-12-21: 60 mg via ORAL
  Filled 2014-12-21: qty 3

## 2014-12-21 MED ORDER — AZITHROMYCIN 250 MG PO TABS: 250.0000 mg | ORAL_TABLET | Freq: Every day | ORAL | Status: DC

## 2014-12-21 MED ORDER — AZITHROMYCIN 250 MG PO TABS
500.0000 mg | ORAL_TABLET | Freq: Once | ORAL | Status: AC
  Administered 2014-12-21: 500 mg via ORAL
  Filled 2014-12-21: qty 2

## 2014-12-21 MED ORDER — SODIUM CHLORIDE 0.9 % IV BOLUS (SEPSIS)
1000.0000 mL | Freq: Once | INTRAVENOUS | Status: AC
  Administered 2014-12-21: 1000 mL via INTRAVENOUS

## 2014-12-21 MED ORDER — ALBUTEROL (5 MG/ML) CONTINUOUS INHALATION SOLN
10.0000 mg/h | INHALATION_SOLUTION | RESPIRATORY_TRACT | Status: DC
  Administered 2014-12-21: 10 mg/h via RESPIRATORY_TRACT
  Filled 2014-12-21: qty 40

## 2014-12-21 MED ORDER — MAGNESIUM SULFATE 2 GM/50ML IV SOLN
2.0000 g | Freq: Once | INTRAVENOUS | Status: AC
  Administered 2014-12-21: 2 g via INTRAVENOUS
  Filled 2014-12-21: qty 50

## 2014-12-21 MED ORDER — IPRATROPIUM BROMIDE 0.02 % IN SOLN
1.0000 mg | Freq: Once | RESPIRATORY_TRACT | Status: AC
  Administered 2014-12-21: 1 mg via RESPIRATORY_TRACT
  Filled 2014-12-21: qty 5

## 2014-12-21 MED ORDER — PREDNISONE 20 MG PO TABS: 60.0000 mg | ORAL_TABLET | Freq: Every day | ORAL | Status: DC

## 2014-12-21 NOTE — Discharge Instructions (Signed)
Crohn Disease Shane Klein, you were seen today for difficulty breathing.  You are being treated for pneumonia, take antibiotics for the next 4 days.  Also take prednisone for your crohns flare.  See a primary care doctor within 3 days for close follow up.  If symptoms worsen, come back to the ED immediately. Thank you. Crohn disease is a long-term (chronic) soreness and redness (inflammation) of the intestines (bowel). It can affect any portion of the digestive tract, from the mouth to the anus. It can also cause problems outside the digestive tract. Crohn disease is closely related to a disease called ulcerative colitis (together, these two diseases are called inflammatory bowel disease).  CAUSES  The cause of Crohn disease is not known. One Link Snuffer is that, in an easily affected person, the immune system is triggered to attack the body's own digestive tissue. Crohn disease runs in families. It seems to be more common in certain geographic areas and amongst certain races. There are no clear-cut dietary causes.  SYMPTOMS  Crohn disease can cause many different symptoms since it can affect many different parts of the body. Symptoms include:  Fatigue.  Weight loss.  Chronic diarrhea, sometime bloody.  Abdominal pain and cramps.  Fever.  Ulcers or canker sores in the mouth or rectum.  Anemia (low red blood cells).  Arthritis, skin problems, and eye problems may occur. Complications of Crohn disease can include:  Series of holes (perforation) of the bowel.  Portions of the intestines sticking to each other (adhesions).  Obstruction of the bowel.  Fistula formation, typically in the rectal area but also in other areas. A fistula is an opening between the bowels and the outside, or between the bowels and another organ.  A painful crack in the mucous membrane of the anus (rectal fissure). DIAGNOSIS  Your caregiver may suspect Crohn disease based on your symptoms and an exam. Blood tests may  confirm that there is a problem. You may be asked to submit a stool specimen for examination. X-rays and CT scans may be necessary. Ultimately, the diagnosis is usually made after a procedure that uses a flexible tube that is inserted via your mouth or your anus. This is done under sedation and is called either an upper endoscopy or colonoscopy. With these tests, the specialist can take tiny tissue samples and remove them from the inside of the bowel (biopsy). Examination of this biopsy tissue under a microscope can reveal Crohn disease as the cause of your symptoms. Due to the many different forms that Crohn disease can take, symptoms may be present for several years before a diagnosis is made. TREATMENT  Medications are often used to decrease inflammation and control the immune system. These include medicines related to aspirin, steroid medications, and newer and stronger medications to slow down the immune system. Some medications may be used as suppositories or enemas. A number of other medications are used or have been studied. Your caregiver will make specific recommendations. HOME CARE INSTRUCTIONS   Symptoms such as diarrhea can be controlled with medications. Avoid foods that have a laxative effect such as fresh fruit, vegetables, and dairy products. During flare-ups, you can rest your bowel by refraining from solid foods. Drink clear liquids frequently during the day. (Electrolyte or rehydrating fluids are best. Your caregiver can help you with suggestions.) Drink often to prevent loss of body fluids (dehydration). When diarrhea has cleared, eat small meals and more frequently. Avoid food additives and stimulants such as caffeine (coffee, tea,  or chocolate). Enzyme supplements may help if you develop intolerance to a sugar in dairy products (lactose). Ask your caregiver or dietitian about specific dietary instructions.  Try to maintain a positive attitude. Learn relaxation techniques such as  self-hypnosis, mental imaging, and muscle relaxation.  If possible, avoid stresses which can aggravate your condition.  Exercise regularly.  Follow your diet.  Always get plenty of rest. SEEK MEDICAL CARE IF:   Your symptoms fail to improve after a week or two of new treatment.  You experience continued weight loss.  You have ongoing cramps or loose bowels.  You develop a new skin rash, skin sores, or eye problems. SEEK IMMEDIATE MEDICAL CARE IF:   You have worsening of your symptoms or develop new symptoms.  You have a fever.  You develop bloody diarrhea.  You develop severe abdominal pain. MAKE SURE YOU:   Understand these instructions.  Will watch your condition.  Will get help right away if you are not doing well or get worse. Document Released: 01/20/2005 Document Revised: 08/27/2013 Document Reviewed: 12/19/2006 [facility] Patient Information 2015 [place], [place]. This information is not intended to replace advice given to you by your health care provider. Make sure you discuss any questions you have with your health care provider. Pneumonia, Adult Pneumonia is an infection of the lungs. It may be caused by a germ (virus or bacteria). Some types of pneumonia can spread easily from person to person. This can happen when you cough or sneeze. HOME CARE  Only take medicine as told by your doctor.  Take your medicine (antibiotics) as told. Finish it even if you start to feel better.  Do not smoke.  You may use a vaporizer or humidifier in your room. This can help loosen thick spit (mucus).  Sleep so you are almost sitting up (semi-upright). This helps reduce coughing.  Rest. A shot (vaccine) can help prevent pneumonia. Shots are often advised for:  People over 54 years old.  Patients on chemotherapy.  People with long-term (chronic) lung problems.  People with immune system problems. GET HELP RIGHT AWAY IF:   You are getting worse.  You cannot control  your cough, and you are losing sleep.  You cough up blood.  Your pain gets worse, even with medicine.  You have a fever.  Any of your problems are getting worse, not better.  You have shortness of breath or chest pain. MAKE SURE YOU:   Understand these instructions.  Will watch your condition.  Will get help right away if you are not doing well or get worse. Document Released: 09/29/2007 Document Revised: 07/05/2011 Document Reviewed: 07/03/2010 [facility] Patient Information 2015 [place], [place]. This information is not intended to replace advice given to you by your health care provider. Make sure you discuss any questions you have with your health care provider.

## 2014-12-21 NOTE — ED Provider Notes (Signed)
CSN: [id number]     Arrival date & time 12/20/14  2208 History  This chart was scribed for Everlene Balls, MD by Irene Pap, ED Scribe. This patient was seen in room A10C/A10C and patient care was started at 1:16 AM.   Chief Complaint  Patient presents with  . URI  . Diarrhea  . Abdominal Pain   The history is provided by the patient. No language interpreter was used.  HPI Comments: Shane Klein is a 24 y.o. male with hx of Crohn's Disease, SBO, GI Bleed, and GERD who presents to the Emergency Department complaining of productive cough, wheezing, sore throat, and runny nose onset 3 days ago, Pt reports associated diarrhea, abdominal pain, and subjective fever. Pt states that his abdominal pain feels like a flare up. He reports taking his daily medications as directed and has been taking Robitussin for his cold symptoms to no relief. He denies hx of asthma, sick contacts, hematochezia, or vomiting.   Past Medical History  Diagnosis Date  . 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. Iron deficiency anemia   . GERD (gastroesophageal reflux disease)   . ANEMIA-IRON DEFICIENCY 04/02/2009  . Protein-calorie malnutrition, severe 04/15/2013  . GI bleed 04/22/2013    EGD by Dr Teena Irani unremarkable  . Crohn's disease 2010    by colon:cecum and ascending colon, by EGD: duodenum, by imaging also in ileum. No villous atrophy on duodenal biopsies.   . SBO (small bowel obstruction) 2013    [place] in area of active Crohn's.    Past Surgical History  Procedure Laterality Date  . Esophagogastroduodenoscopy  Oct 2010    Dr. Fuller Plan: duodenitis, normal villi, likely Crohn's. Elevated Gliadin but normal TTG, IgA and IgA  . Colonoscopy  Oct 2010    Dr. Fuller Plan: evidence of Crohn's at cecum, ascending colon, unable to intubate the terminal ileum. CTE with distal and terminal ileitis  . Esophagogastroduodenoscopy N/A 04/24/2013    Procedure: ESOPHAGOGASTRODUODENOSCOPY (EGD);  Surgeon: Missy Sabins, MD;  Location: [facility] ENDOSCOPY;   Service: Endoscopy;  Laterality: N/A;   Family History  Problem Relation Age of Onset  . Diabetes Maternal Aunt   . Diabetes Maternal Grandmother   . Colon cancer Neg Hx   . Crohn's disease Maternal Aunt   . Hypertension     Social History  Substance Use Topics  . Smoking status: Current Every Day Smoker -- 1.00 packs/day for 2 years    Types: Cigarettes  . Smokeless tobacco: Never Used  . Alcohol Use: Yes     Comment: once a week    Review of Systems    Allergies  Gluten meal and Wheat bran  Home Medications   Prior to Admission medications   Medication Sig Start Date End Date Taking? Authorizing Provider  ferrous sulfate 325 (65 FE) MG tablet Take 1 tablet (325 mg total) by mouth daily with breakfast. Patient not taking: Reported on 11/08/2014 08/06/14   Jones Bales, MD  ibuprofen (ADVIL,MOTRIN) 800 MG tablet Take 800 mg by mouth 3 (three) times daily as needed.    Historical Provider, MD  Multiple Vitamin (MULTIVITAMIN WITH MINERALS) TABS tablet Take 1 tablet by mouth daily.    Historical Provider, MD  oxyCODONE (OXY IR/ROXICODONE) 5 MG immediate release tablet Take 1 tablet (5 mg total) by mouth every 4 (four) hours as needed for severe pain. Patient not taking: Reported on 11/08/2014 08/04/14   Carmin Muskrat, MD  penicillin v potassium (VEETID) 500 MG tablet Take 500 mg  by mouth 4 (four) times daily.    Historical Provider, MD  predniSONE (DELTASONE) 20 MG tablet Take 3 tablets for 5 days, then take 2.5 tablets for 5 days, then take 2 tablets for 5 days, then take 1 tablet for 5 days, then take 1/2 tablet for 5 days, then stop. 11/08/14   Davonna Belling, MD  promethazine (PHENERGAN) 25 MG tablet Take 1 tablet (25 mg total) by mouth every 6 (six) hours as needed for nausea. 11/08/14   Davonna Belling, MD  sulfaSALAzine (AZULFIDINE) 500 MG tablet Take 1,000 mg by mouth 3 (three) times daily.    Historical Provider, MD   BP 139/87 mmHg  Pulse 102  Temp(Src) 99.5 F  (37.5 C) (Oral)  Resp 20  Ht 5\' 10"  (1.778 m)  Wt 196 lb (88.905 kg)  BMI 28.12 kg/m2  SpO2 93% Physical Exam  Constitutional: He is oriented to person, place, and time. Vital signs are normal. He appears well-developed and well-nourished.  Non-toxic appearance. He does not appear ill. No distress.  HENT:  Head: Normocephalic and atraumatic.  Nose: Nose normal.  Mouth/Throat: Oropharynx is clear and moist. No oropharyngeal exudate.  Eyes: Conjunctivae and EOM are normal. Pupils are equal, round, and reactive to light. No scleral icterus.  Neck: Normal range of motion. Neck supple. No tracheal deviation, no edema, no erythema and normal range of motion present. No thyroid mass and no thyromegaly present.  Cardiovascular: Regular rhythm, S1 normal, S2 normal, normal heart sounds, intact distal pulses and normal pulses.  Tachycardia present.  Exam reveals no gallop and no friction rub.   No murmur heard. Pulses:      Radial pulses are 2+ on the right side, and 2+ on the left side.       Dorsalis pedis pulses are 2+ on the right side, and 2+ on the left side.  Pulmonary/Chest: Effort normal. No respiratory distress. He has wheezes. He has no rhonchi. He has no rales.  Bilateral wheezing and rhonchi  Abdominal: Soft. Normal appearance and bowel sounds are normal. He exhibits no distension, no ascites and no mass. There is no hepatosplenomegaly. There is no tenderness. There is no rebound, no guarding and no CVA tenderness.  Musculoskeletal: Normal range of motion. He exhibits no edema or tenderness.  Lymphadenopathy:    He has no cervical adenopathy.  Neurological: He is alert and oriented to person, place, and time. He has normal strength. No cranial nerve deficit or sensory deficit.  Skin: Skin is warm, dry and intact. No petechiae and no rash noted. He is not diaphoretic. No erythema. No pallor.  Tactile fever  Psychiatric: He has a normal mood and affect. His behavior is normal. Judgment  normal.  Nursing note and vitals reviewed.   ED Course  Procedures (including critical care time) DIAGNOSTIC STUDIES: Oxygen Saturation is 95% on RA, normal by my interpretation.    COORDINATION OF CARE: 1:19 AM-Discussed treatment plan which includes chest x-ray and labs with pt at bedside and pt agreed to plan.   Labs Review Labs Reviewed  LIPASE, BLOOD - Abnormal; Notable for the following:    Lipase 16 (*)    All other components within normal limits  COMPREHENSIVE METABOLIC PANEL - Abnormal; Notable for the following:    Glucose, Bld 107 (*)    Calcium 8.7 (*)    Albumin 2.9 (*)    ALT 11 (*)    All other components within normal limits  CBC - Abnormal; Notable for the  following:    Hemoglobin 9.5 (*)    HCT 30.6 (*)    MCV 67.4 (*)    MCH 20.9 (*)    RDW 17.6 (*)    Platelets 530 (*)    All other components within normal limits  URINALYSIS, ROUTINE W REFLEX MICROSCOPIC (NOT AT [facility])    Imaging Review Dg Chest 2 View  12/20/2014   CLINICAL DATA:  Shortness of breath, chest pain and productive cough for 3 days, history smoking, Crohn's disease  EXAM: CHEST  2 VIEW  COMPARISON:  11/08/2014  FINDINGS: Upper normal heart size.  Mediastinal contours and pulmonary vascularity normal.  Minimal chronic peribronchial thickening.  No pulmonary infiltrate, pleural effusion or pneumothorax.  Osseous structures unremarkable.  IMPRESSION: Minimal chronic bronchitic changes without infiltrate.   Electronically Signed   By: Lavonia Dana M.D.   On: 12/20/2014 23:41      EKG Interpretation None      MDM   Final diagnoses:  None   Patient presents to the ED for viral URI like symptoms for the past several days.  CXR is negative however history is consistent within pneumonia, I will treat with azithromycin.  He was also given breathing treatment, steroids an magnesium.  Prednisone will help with chrons flare as well.  He denies this flare being significant or any bloody stool.  Will  DC with steroid course and encourage follow up.  Upon repeat evaluation, wheezing has significantly improved. Patient states he feels much better as well.  Will DC with 4 more days of azithro and prednisone for treatment.  PCP fu advised.  He appears well, sleeping comfortably in the room and in NAD.  His VS remain within his normal limits and he is safe for DC.    I personally performed the services described in this documentation, which was scribed in my presence. The recorded information has been reviewed and is accurate.    Everlene Balls, MD 12/21/14 [phone]

## 2015-01-14 ENCOUNTER — Emergency Department (HOSPITAL_COMMUNITY)
Admission: EM | Admit: 2015-01-14 | Discharge: 2015-01-14 | Disposition: A | Discharge status: Home / Self Care | Attending: Emergency Medicine | Admitting: Emergency Medicine

## 2015-01-14 ENCOUNTER — Encounter (HOSPITAL_COMMUNITY): Payer: Self-pay | Admitting: Emergency Medicine

## 2015-01-14 DIAGNOSIS — K5010 Crohn's disease of large intestine without complications: Secondary | ICD-10-CM

## 2015-01-14 DIAGNOSIS — K5090 Crohn's disease, unspecified, without complications: Secondary | ICD-10-CM | POA: Insufficient documentation

## 2015-01-14 DIAGNOSIS — K219 Gastro-esophageal reflux disease without esophagitis: Secondary | ICD-10-CM | POA: Insufficient documentation

## 2015-01-14 DIAGNOSIS — Z862 Personal history of diseases of the blood and blood-forming organs and certain disorders involving the immune mechanism: Secondary | ICD-10-CM | POA: Insufficient documentation

## 2015-01-14 DIAGNOSIS — R109 Unspecified abdominal pain: Secondary | ICD-10-CM

## 2015-01-14 DIAGNOSIS — Z720 Tobacco use: Secondary | ICD-10-CM | POA: Insufficient documentation

## 2015-01-14 DIAGNOSIS — Z792 Long term (current) use of antibiotics: Secondary | ICD-10-CM | POA: Insufficient documentation

## 2015-01-14 DIAGNOSIS — Z79899 Other long term (current) drug therapy: Secondary | ICD-10-CM | POA: Insufficient documentation

## 2015-01-14 LAB — CBC
HEMATOCRIT: 31.1 % — AB (ref 39.0–52.0)
Hemoglobin: 9.7 g/dL — ABNORMAL LOW (ref 13.0–17.0)
MCH: 20.9 pg — ABNORMAL LOW (ref 26.0–34.0)
MCHC: 31.2 g/dL (ref 30.0–36.0)
MCV: 66.9 fL — ABNORMAL LOW (ref 78.0–100.0)
Platelets: 471 10*3/uL — ABNORMAL HIGH (ref 150–400)
RBC: 4.65 MIL/uL (ref 4.22–5.81)
RDW: 17.6 % — AB (ref 11.5–15.5)
WBC: 6 10*3/uL (ref 4.0–10.5)

## 2015-01-14 LAB — LIPASE, BLOOD: Lipase: 21 U/L — ABNORMAL LOW (ref 22–51)

## 2015-01-14 LAB — COMPREHENSIVE METABOLIC PANEL
ALT: 8 U/L — ABNORMAL LOW (ref 17–63)
ANION GAP: 5 (ref 5–15)
AST: 13 U/L — AB (ref 15–41)
Albumin: 2.8 g/dL — ABNORMAL LOW (ref 3.5–5.0)
Alkaline Phosphatase: 49 U/L (ref 38–126)
BILIRUBIN TOTAL: 0.4 mg/dL (ref 0.3–1.2)
BUN: 11 mg/dL (ref 6–20)
CO2: 23 mmol/L (ref 22–32)
Calcium: 8.5 mg/dL — ABNORMAL LOW (ref 8.9–10.3)
Chloride: 108 mmol/L (ref 101–111)
Creatinine, Ser: 0.89 mg/dL (ref 0.61–1.24)
GFR calc Af Amer: >60 mL/min (ref >60)
Glucose, Bld: 82 mg/dL (ref 65–99)
POTASSIUM: 3.8 mmol/L (ref 3.5–5.1)
Sodium: 136 mmol/L (ref 135–145)
TOTAL PROTEIN: 6.3 g/dL — AB (ref 6.5–8.1)

## 2015-01-14 MED ORDER — HYDROMORPHONE HCL 1 MG/ML IJ SOLN: 1.0000 mg | Freq: Once | INTRAMUSCULAR | Status: DC

## 2015-01-14 MED ORDER — PREDNISONE 20 MG PO TABS
60.0000 mg | ORAL_TABLET | Freq: Once | ORAL | Status: AC
  Administered 2015-01-14: 60 mg via ORAL
  Filled 2015-01-14: qty 3

## 2015-01-14 MED ORDER — PREDNISONE 20 MG PO TABS: ORAL_TABLET | ORAL | Status: DC

## 2015-01-14 MED ORDER — OXYCODONE-ACETAMINOPHEN 5-325 MG PO TABS
2.0000 | ORAL_TABLET | Freq: Once | ORAL | Status: AC
  Administered 2015-01-14: 2 via ORAL
  Filled 2015-01-14: qty 2

## 2015-01-14 MED ORDER — PREDNISONE 20 MG PO TABS: 60.0000 mg | ORAL_TABLET | Freq: Every day | ORAL | Status: DC

## 2015-01-14 NOTE — ED Provider Notes (Signed)
CSN: [id number]     Arrival date & time 01/14/15  1702 History   First MD Initiated Contact with Patient 01/14/15 2110     Chief Complaint  Patient presents with  . Abdominal Pain  . Diarrhea     (Consider location/radiation/quality/duration/timing/severity/associated sxs/prior Treatment) Patient is a 24 y.o. male presenting with abdominal pain and diarrhea.  Abdominal Pain Pain location:  Generalized Pain quality: sharp   Pain radiates to:  Does not radiate Pain severity:  Moderate Onset quality:  Gradual Duration:  2 days Timing:  Constant Progression:  Unchanged Chronicity:  New Context comment:  Similar to prior crohns flare Relieved by:  Nothing Worsened by:  Movement, palpation and eating Associated symptoms: diarrhea   Associated symptoms: no chest pain, no chills, no cough, no melena, no nausea and no vomiting   Diarrhea Associated symptoms: abdominal pain   Associated symptoms: no chills and no vomiting     Past Medical History  Diagnosis Date  . 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. Iron deficiency anemia   . GERD (gastroesophageal reflux disease)   . ANEMIA-IRON DEFICIENCY 04/02/2009  . Protein-calorie malnutrition, severe 04/15/2013  . GI bleed 04/22/2013    EGD by Dr Teena Irani unremarkable  . Crohn's disease 2010    by colon:cecum and ascending colon, by EGD: duodenum, by imaging also in ileum. No villous atrophy on duodenal biopsies.   . SBO (small bowel obstruction) 2013    in [place] in area of active Crohn's.    Past Surgical History  Procedure Laterality Date  . Esophagogastroduodenoscopy  Oct 2010    Dr. Fuller Plan: duodenitis, normal villi, likely Crohn's. Elevated Gliadin but normal TTG, IgA and IgA  . Colonoscopy  Oct 2010    Dr. Fuller Plan: evidence of Crohn's at cecum, ascending colon, unable to intubate the terminal ileum. CTE with distal and terminal ileitis  . Esophagogastroduodenoscopy N/A 04/24/2013    Procedure: ESOPHAGOGASTRODUODENOSCOPY (EGD);  Surgeon: Missy Sabins,  MD;  Location: [facility] ENDOSCOPY;  Service: Endoscopy;  Laterality: N/A;   Family History  Problem Relation Age of Onset  . Diabetes Maternal Aunt   . Diabetes Maternal Grandmother   . Colon cancer Neg Hx   . Crohn's disease Maternal Aunt   . Hypertension     Social History  Substance Use Topics  . Smoking status: Current Every Day Smoker -- 1.00 packs/day for 2 years    Types: Cigarettes  . Smokeless tobacco: Never Used  . Alcohol Use: Yes     Comment: once a week    Review of Systems  Constitutional: Negative for chills.  Respiratory: Negative for cough.   Cardiovascular: Negative for chest pain.  Gastrointestinal: Positive for abdominal pain and diarrhea. Negative for nausea, vomiting and melena.  All other systems reviewed and are negative.     Allergies  Gluten meal and Wheat bran  Home Medications   Prior to Admission medications   Medication Sig Start Date End Date Taking? Authorizing Provider  azithromycin (ZITHROMAX) 250 MG tablet Take 1 tablet (250 mg total) by mouth daily. 12/21/14   Everlene Balls, MD  budesonide (ENTOCORT EC) 3 MG 24 hr capsule Take 3 mg by mouth daily.    Historical Provider, MD  ferrous sulfate 325 (65 FE) MG tablet Take 1 tablet (325 mg total) by mouth daily with breakfast. Patient not taking: Reported on 11/08/2014 08/06/14   Jones Bales, MD  oxyCODONE (OXY IR/ROXICODONE) 5 MG immediate release tablet Take 1 tablet (5 mg total) by mouth  every 4 (four) hours as needed for severe pain. Patient not taking: Reported on 11/08/2014 08/04/14   Carmin Muskrat, MD  predniSONE (DELTASONE) 20 MG tablet 3 tabs po day one, then 2 po daily x 4 days 01/14/15   Debby Freiberg, MD  predniSONE (DELTASONE) 20 MG tablet Take 3 tablets (60 mg total) by mouth daily. 01/14/15   Debby Freiberg, MD  sulfaSALAzine (AZULFIDINE) 500 MG tablet Take 1,000 mg by mouth 3 (three) times daily.    Historical Provider, MD   BP 131/67 mmHg  Pulse 75  Temp(Src) 99 F (37.2 C)  (Oral)  Resp 18  SpO2 100% Physical Exam  Constitutional: He is oriented to person, place, and time. He appears well-developed and well-nourished.  HENT:  Head: Normocephalic and atraumatic.  Eyes: Conjunctivae and EOM are normal.  Neck: Normal range of motion. Neck supple.  Cardiovascular: Normal rate, regular rhythm and normal heart sounds.   Pulmonary/Chest: Effort normal and breath sounds normal. No respiratory distress.  Abdominal: He exhibits no distension. There is tenderness in the left upper quadrant. There is no rebound and no guarding.  Musculoskeletal: Normal range of motion.  Neurological: He is alert and oriented to person, place, and time.  Skin: Skin is warm and dry.  Vitals reviewed.   ED Course  Procedures (including critical care time) Labs Review Labs Reviewed  LIPASE, BLOOD - Abnormal; Notable for the following:    Lipase 21 (*)    All other components within normal limits  COMPREHENSIVE METABOLIC PANEL - Abnormal; Notable for the following:    Calcium 8.5 (*)    Total Protein 6.3 (*)    Albumin 2.8 (*)    AST 13 (*)    ALT 8 (*)    All other components within normal limits  CBC - Abnormal; Notable for the following:    Hemoglobin 9.7 (*)    HCT 31.1 (*)    MCV 66.9 (*)    MCH 20.9 (*)    RDW 17.6 (*)    Platelets 471 (*)    All other components within normal limits    Imaging Review No results found. I have personally reviewed and evaluated these images and lab results as part of my medical decision-making.   EKG Interpretation None      MDM   Final diagnoses:  Crohn's colitis, without complications  Acute abdominal pain    24 y.o. male with pertinent PMH of crohns presents with recurrent symptoms of abd pain and diarrhea.  Taking PO without difficulty.  Pt specifically requests steroid taper, does not want home pain medication.  DC home with prednisone and taper.  Do not feel pt necessitates imaging given mild LUQ tenderness and benign  labs.    I have reviewed all laboratory and imaging studies if ordered as above  1. Crohn's colitis, without complications   2. Acute abdominal pain         Debby Freiberg, MD 01/14/15 2123

## 2015-01-14 NOTE — ED Notes (Signed)
Pt. Left with all belongings and refused wheelchair. Discharge instructions were reviewed and all questions were answered.  

## 2015-01-14 NOTE — ED Notes (Signed)
Pt sts lower abd pain and diarrhea x 2 days; pt sts hx of crohns and feels same

## 2015-01-14 NOTE — Discharge Instructions (Signed)

## 2015-03-16 ENCOUNTER — Emergency Department (HOSPITAL_COMMUNITY)
Admission: EM | Admit: 2015-03-16 | Discharge: 2015-03-16 | Disposition: A | Discharge status: Home / Self Care | Payer: Self-pay | Attending: Emergency Medicine | Admitting: Emergency Medicine

## 2015-03-16 ENCOUNTER — Encounter (HOSPITAL_COMMUNITY): Payer: Self-pay | Admitting: Emergency Medicine

## 2015-03-16 ENCOUNTER — Emergency Department (HOSPITAL_COMMUNITY)

## 2015-03-16 DIAGNOSIS — Z862 Personal history of diseases of the blood and blood-forming organs and certain disorders involving the immune mechanism: Secondary | ICD-10-CM | POA: Insufficient documentation

## 2015-03-16 DIAGNOSIS — K50918 Crohn's disease, unspecified, with other complication: Secondary | ICD-10-CM | POA: Insufficient documentation

## 2015-03-16 DIAGNOSIS — Z8639 Personal history of other endocrine, nutritional and metabolic disease: Secondary | ICD-10-CM | POA: Insufficient documentation

## 2015-03-16 DIAGNOSIS — K50919 Crohn's disease, unspecified, with unspecified complications: Secondary | ICD-10-CM

## 2015-03-16 DIAGNOSIS — R1032 Left lower quadrant pain: Secondary | ICD-10-CM

## 2015-03-16 DIAGNOSIS — F17210 Nicotine dependence, cigarettes, uncomplicated: Secondary | ICD-10-CM | POA: Insufficient documentation

## 2015-03-16 DIAGNOSIS — K567 Ileus, unspecified: Secondary | ICD-10-CM | POA: Insufficient documentation

## 2015-03-16 DIAGNOSIS — Z8709 Personal history of other diseases of the respiratory system: Secondary | ICD-10-CM | POA: Insufficient documentation

## 2015-03-16 LAB — URINALYSIS, ROUTINE W REFLEX MICROSCOPIC
BILIRUBIN URINE: NEGATIVE
GLUCOSE, UA: NEGATIVE mg/dL
HGB URINE DIPSTICK: NEGATIVE
KETONES UR: NEGATIVE mg/dL
Leukocytes, UA: NEGATIVE
Nitrite: NEGATIVE
PROTEIN: NEGATIVE mg/dL
SPECIFIC GRAVITY, URINE: 1.021 (ref 1.005–1.030)
pH: 5.5 (ref 5.0–8.0)

## 2015-03-16 LAB — COMPREHENSIVE METABOLIC PANEL
ALBUMIN: 2.4 g/dL — AB (ref 3.5–5.0)
ALK PHOS: 51 U/L (ref 38–126)
ALT: 9 U/L — AB (ref 17–63)
ANION GAP: 6 (ref 5–15)
AST: 14 U/L — ABNORMAL LOW (ref 15–41)
BUN: 11 mg/dL (ref 6–20)
CALCIUM: 8.5 mg/dL — AB (ref 8.9–10.3)
CO2: 24 mmol/L (ref 22–32)
CREATININE: 0.92 mg/dL (ref 0.61–1.24)
Chloride: 108 mmol/L (ref 101–111)
GFR calc Af Amer: >60 mL/min (ref >60)
GFR calc non Af Amer: >60 mL/min (ref >60)
GLUCOSE: 85 mg/dL (ref 65–99)
Potassium: 3.9 mmol/L (ref 3.5–5.1)
SODIUM: 138 mmol/L (ref 135–145)
Total Bilirubin: 0.3 mg/dL (ref 0.3–1.2)
Total Protein: 5.9 g/dL — ABNORMAL LOW (ref 6.5–8.1)

## 2015-03-16 LAB — CBC
HCT: 31 % — ABNORMAL LOW (ref 39.0–52.0)
HEMOGLOBIN: 10 g/dL — AB (ref 13.0–17.0)
MCH: 21.1 pg — AB (ref 26.0–34.0)
MCHC: 32.3 g/dL (ref 30.0–36.0)
MCV: 65.3 fL — ABNORMAL LOW (ref 78.0–100.0)
Platelets: 627 10*3/uL — ABNORMAL HIGH (ref 150–400)
RBC: 4.75 MIL/uL (ref 4.22–5.81)
RDW: 17.7 % — ABNORMAL HIGH (ref 11.5–15.5)
WBC: 8.2 10*3/uL (ref 4.0–10.5)

## 2015-03-16 LAB — LIPASE, BLOOD: Lipase: 21 U/L (ref 11–51)

## 2015-03-16 MED ORDER — MORPHINE SULFATE (PF) 4 MG/ML IV SOLN
4.0000 mg | Freq: Once | INTRAVENOUS | Status: AC
  Administered 2015-03-16: 4 mg via INTRAVENOUS
  Filled 2015-03-16: qty 1

## 2015-03-16 MED ORDER — HYDROMORPHONE HCL 1 MG/ML IJ SOLN
1.0000 mg | Freq: Once | INTRAMUSCULAR | Status: AC
  Administered 2015-03-16: 1 mg via INTRAVENOUS
  Filled 2015-03-16: qty 1

## 2015-03-16 MED ORDER — PREDNISONE 10 MG (21) PO TBPK: 10.0000 mg | ORAL_TABLET | Freq: Every day | ORAL | Status: DC

## 2015-03-16 MED ORDER — TRAMADOL HCL 50 MG PO TABS: 50.0000 mg | ORAL_TABLET | Freq: Four times a day (QID) | ORAL | Status: DC | PRN

## 2015-03-16 MED ORDER — OXYCODONE-ACETAMINOPHEN 5-325 MG PO TABS
1.0000 | ORAL_TABLET | Freq: Once | ORAL | Status: AC
  Administered 2015-03-16: 1 via ORAL
  Filled 2015-03-16: qty 1

## 2015-03-16 MED ORDER — ONDANSETRON 4 MG PO TBDP
8.0000 mg | ORAL_TABLET | Freq: Once | ORAL | Status: AC
  Administered 2015-03-16: 8 mg via ORAL
  Filled 2015-03-16: qty 2

## 2015-03-16 MED ORDER — IOHEXOL 300 MG/ML  SOLN
100.0000 mL | Freq: Once | INTRAMUSCULAR | Status: AC | PRN
  Administered 2015-03-16: 100 mL via INTRAVENOUS

## 2015-03-16 MED ORDER — METHYLPREDNISOLONE SODIUM SUCC 125 MG IJ SOLR
60.0000 mg | Freq: Once | INTRAMUSCULAR | Status: AC
  Administered 2015-03-16: 60 mg via INTRAVENOUS
  Filled 2015-03-16: qty 2

## 2015-03-16 MED ORDER — SODIUM CHLORIDE 0.9 % IV BOLUS (SEPSIS)
1000.0000 mL | Freq: Once | INTRAVENOUS | Status: AC
  Administered 2015-03-16: 1000 mL via INTRAVENOUS

## 2015-03-16 NOTE — Discharge Instructions (Signed)
Ileus Follow-up with gastroenterology. Return for fever, vomiting, inability to pass stool or gas or increased abdominal pain.  Ileus is a condition in which the intestines, also called the bowels, stop working and moving correctly. If the intestines stop working, food cannot pass through to get digested. The intestines are hollow organs that digest food after the food leaves the stomach. These organs are long, muscular tubes that connect the stomach to the rectum. When ileus occurs, the muscular contractions that cause food to move through the intestines stop happening as they normally would. Ileus can occur for various reasons. This condition is a serious problem that usually requires hospitalization. It can cause symptoms such as nausea, abdominal pain, and bloating. Ileus can last from a few hours to a few days. If the intestines stop working because of a blockage, that is a different condition that is called a bowel obstruction. CAUSES This condition may be caused by:  Surgery on the abdomen.  An infection or inflammation in the abdomen. This includes inflammation of the lining of the abdomen (peritonitis).  Infection or inflammation in other parts of the body, such as pneumonia or pancreatitis.  Passage of gallstones or kidney stones.  Damage to the nerves or blood vessels that go to the intestines.  A collection of blood within the abdominal cavity.  Imbalance in the salts in the blood (electrolytes).  Injury to the brain or spinal cord.  Medicines. Many medicines, including strong pain medicines, can cause ileus or make it worse. SYMPTOMS Symptoms of this condition include:  Bloating of the abdomen.  Pain or discomfort in the abdomen.  Poor appetite.  Nausea and vomiting.  Lack of normal bowel sounds, such as "growling" in the stomach. DIAGNOSIS This condition may be diagnosed with:  A physical exam and medical history.  X-rays or a CT scan of the abdomen. You may  also have other tests to help find the cause of the condition. TREATMENT Treatment for this condition may include:  Resting the intestines until they start to work again. This is often done by:  Stopping oral intake of food and drink. You will be given fluid through an IV tube to prevent dehydration.  Placing a small tube (nasogastric tube or NG tube) that is passed through your nose and into your stomach. The tube is attached to a suction device and keeps the stomach emptied out. This allows the bowels to rest and also helps to reduce nausea and vomiting.  Correcting any electrolyte imbalance by giving supplements in the IV fluid.  Stopping any medicines that might make ileus worse.  Treating any condition that may have caused ileus. HOME CARE INSTRUCTIONS  Follow instructions from your health care provider about diet and fluid intake. Usually, you will be told to:  Drink plenty of clear fluids.  Avoid alcohol.  Avoid caffeine.  Eat a bland diet.  Get plenty of rest. Return to your normal activities as told by your health care provider.  Take over-the-counter and prescription medicines only as told by your health care provider.  Keep all follow-up visits as told by your health care provider. This is important. SEEK MEDICAL CARE IF:  You have nausea, vomiting, or abdominal discomfort.  You have a fever. SEEK IMMEDIATE MEDICAL CARE IF:  You have severe abdominal pain or bloating.  You cannot eat or drink without vomiting.   This information is not intended to replace advice given to you by your health care provider. Make sure you discuss any  questions you have with your health care provider.   Document Released: 04/15/2003 Document Revised: 01/01/2015 Document Reviewed: 06/06/2014 Elsevier Interactive Patient Education [employer].

## 2015-03-16 NOTE — ED Notes (Signed)
Pt was able to drink a gingerale without difficulty. Will notify PA.

## 2015-03-16 NOTE — ED Notes (Signed)
Per patient started having abdominal pain early yesterday morning.  Pain radiates into back.  Hx of Chrohn's.  Denies nausea and vomiting.  Reports 4 or 5 diarrhea stools since yesterday.

## 2015-03-16 NOTE — ED Provider Notes (Signed)
CSN: [id number]     Arrival date & time 03/16/15  0454 History   First MD Initiated Contact with Patient 03/16/15 0601     Chief Complaint  Patient presents with  . Abdominal Pain    (Consider location/radiation/quality/duration/timing/severity/associated sxs/prior Treatment) Patient is a 24 y.o. male presenting with abdominal pain. The history is provided by the patient. No language interpreter was used.  Abdominal Pain  Shane Klein is a 24 year old male with a past medical history of iron deficiency anemia, GERD, and Crohn's disease who presents for gradual onset of left lower quadrant abdominal pain that began yesterday morning and radiates to his back. He also reports 5 episodes of diarrhea but without blood. He has also been able to pass gas. His last bowel movement was early this morning. He states this is similar to his previous Crohn's flareups but he feels that he has a bowel obstruction. He states he has had bowel obstructions in the past but none that require surgery. He is currently not taking any medications for his Crohn's disease. He denies any treatment prior to arrival.  He denies any fever, chills, constipation, vomiting, nausea, dysuria, hematuria.  Past Medical History  Diagnosis Date  . 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 Iron deficiency anemia   . GERD (gastroesophageal reflux disease)   . ANEMIA-IRON DEFICIENCY 04/02/2009  . Protein-calorie malnutrition, severe ([place]) 04/15/2013  . GI bleed 04/22/2013    EGD by Dr Teena Irani unremarkable  . Crohn's disease ([place]) 2010    by colon:cecum and ascending colon, by EGD: duodenum, by imaging also in ileum. No villous atrophy on duodenal biopsies.   . SBO (small bowel obstruction) ([place]) 2013    in [place] in area of active Crohn's.    Past Surgical History  Procedure Laterality Date  . Esophagogastroduodenoscopy  Oct 2010    Dr. Fuller Plan: duodenitis, normal villi, likely Crohn's. Elevated Gliadin but normal TTG, IgA and IgA  . Colonoscopy  Oct  2010    Dr. Fuller Plan: evidence of Crohn's at cecum, ascending colon, unable to intubate the terminal ileum. CTE with distal and terminal ileitis  . Esophagogastroduodenoscopy N/A 04/24/2013    Procedure: ESOPHAGOGASTRODUODENOSCOPY (EGD);  Surgeon: Missy Sabins, MD;  Location: [facility] ENDOSCOPY;  Service: Endoscopy;  Laterality: N/A;   Family History  Problem Relation Age of Onset  . Diabetes Maternal Aunt   . Diabetes Maternal Grandmother   . Colon cancer Neg Hx   . Crohn's disease Maternal Aunt   . Hypertension     Social History  Substance Use Topics  . Smoking status: Current Every Day Smoker -- 1.00 packs/day for 2 years    Types: Cigarettes  . Smokeless tobacco: Never Used  . Alcohol Use: Yes     Comment: occasionally    Review of Systems  Gastrointestinal: Positive for abdominal pain.  All other systems reviewed and are negative.     Allergies  Gluten meal and Wheat bran  Home Medications   Prior to Admission medications   Medication Sig Start Date End Date Taking? Authorizing Provider  azithromycin (ZITHROMAX) 250 MG tablet Take 1 tablet (250 mg total) by mouth daily. Patient not taking: Reported on 03/16/2015 12/21/14   Everlene Balls, MD  ferrous sulfate 325 (65 FE) MG tablet Take 1 tablet (325 mg total) by mouth daily with breakfast. Patient not taking: Reported on 11/08/2014 08/06/14   Jones Bales, MD  oxyCODONE (OXY IR/ROXICODONE) 5 MG immediate release tablet Take 1 tablet (5 mg total) by  mouth every 4 (four) hours as needed for severe pain. Patient not taking: Reported on 11/08/2014 08/04/14   Carmin Muskrat, MD  predniSONE (STERAPRED UNI-PAK 21 TAB) 10 MG (21) TBPK tablet Take 1 tablet (10 mg total) by mouth daily. Take 5 tabs for 2 days, then 4 tabs for 2 days, then 3 tabs for 2 days, 2 tabs for 2 days, then 1 tab by mouth daily for 2 days 03/16/15   Ottie Glazier, PA-C  traMADol (ULTRAM) 50 MG tablet Take 1 tablet (50 mg total) by mouth every 6 (six) hours as  needed. 03/16/15   Shane Dentler Patel-Mills, PA-C   BP 116/74 mmHg  Pulse 59  Temp(Src) 98.3 F (36.8 C) (Oral)  Resp 16  Ht 5\' 11"  (1.803 m)  Wt 79.379 kg  BMI 24.42 kg/m2  SpO2 100% Physical Exam  Constitutional: He is oriented to person, place, and time. He appears well-developed and well-nourished.  HENT:  Head: Normocephalic and atraumatic.  Eyes: Conjunctivae are normal.  Neck: Normal range of motion. Neck supple.  Cardiovascular: Normal rate, regular rhythm and normal heart sounds.   Pulmonary/Chest: Effort normal and breath sounds normal. No respiratory distress. He has no wheezes. He has no rales.  Abdominal: Soft. He exhibits no distension. There is tenderness in the left lower quadrant. There is no rebound and no guarding.  Moderate left lower quadrant abdominal tenderness to palpation. No abdominal distention. No guarding or rebound.  Musculoskeletal: Normal range of motion.  Neurological: He is alert and oriented to person, place, and time.  Skin: Skin is warm and dry.  Nursing note and vitals reviewed.   ED Course  Procedures (including critical care time) Labs Review Labs Reviewed  COMPREHENSIVE METABOLIC PANEL - Abnormal; Notable for the following:    Calcium 8.5 (*)    Total Protein 5.9 (*)    Albumin 2.4 (*)    AST 14 (*)    ALT 9 (*)    All other components within normal limits  CBC - Abnormal; Notable for the following:    Hemoglobin 10.0 (*)    HCT 31.0 (*)    MCV 65.3 (*)    MCH 21.1 (*)    RDW 17.7 (*)    Platelets 627 (*)    All other components within normal limits  LIPASE, BLOOD  URINALYSIS, ROUTINE W REFLEX MICROSCOPIC (NOT AT [facility])    Imaging Review No results found. I have personally reviewed and evaluated these images and lab results as part of my medical decision-making.   EKG Interpretation None      MDM   Final diagnoses:  Ileus ([place])  Left lower quadrant pain  Crohn's disease with complication, unspecified gastrointestinal  tract location [facility])   Patient with history of Crohn's presents for left lower quadrant abdominal pain. Last bowel movement was this morning. No fever, nausea, vomiting, or bloody diarrhea. Vital signs are stable. He has no peritoneal signs, guarding, or rebound on exam. CT scan of abdomen from 7 months ago show small bowel obstruction with significant inflammation consistent with Crohn's. His labs are not concerning. CT scan shows thickening of the distal ileum and mild mesenteric inflammatory changes. He also has an ileus. Patient is tolerating PO fluids.  He has no vomiting or diarrhea in the ED.  His pain is well controlled now.  I discussed GI follow up and he was given a prescription for Ultram and prednisone. I gave the patient return precautions and he verbally agrees with the plan. Medications  ondansetron (ZOFRAN-ODT) disintegrating tablet 8 mg (8 mg Oral Given 03/16/15 0539)  oxyCODONE-acetaminophen (PERCOCET/ROXICET) 5-325 MG per tablet 1 tablet (1 tablet Oral Given 03/16/15 0539)  morphine 4 MG/ML injection 4 mg (4 mg Intravenous Given 03/16/15 0620)  methylPREDNISolone sodium succinate (SOLU-MEDROL) 125 mg/2 mL injection 60 mg (60 mg Intravenous Given 03/16/15 0620)  sodium chloride 0.9 % bolus 1,000 mL (0 mLs Intravenous Stopped 03/16/15 0841)  HYDROmorphone (DILAUDID) injection 1 mg (1 mg Intravenous Given 03/16/15 0656)  iohexol (OMNIPAQUE) 300 MG/ML solution 100 mL (100 mLs Intravenous Contrast Given 03/16/15 0848)     Ottie Glazier, PA-C 03/22/15 1026  Debby Freiberg, MD 03/22/15 [phone]

## 2015-04-11 ENCOUNTER — Emergency Department (HOSPITAL_COMMUNITY)
Admission: EM | Admit: 2015-04-11 | Discharge: 2015-04-11 | Disposition: A | Discharge status: Home / Self Care | Attending: Emergency Medicine | Admitting: Emergency Medicine

## 2015-04-11 ENCOUNTER — Encounter (HOSPITAL_COMMUNITY): Payer: Self-pay | Admitting: Emergency Medicine

## 2015-04-11 DIAGNOSIS — F17210 Nicotine dependence, cigarettes, uncomplicated: Secondary | ICD-10-CM | POA: Insufficient documentation

## 2015-04-11 DIAGNOSIS — D509 Iron deficiency anemia, unspecified: Secondary | ICD-10-CM | POA: Insufficient documentation

## 2015-04-11 DIAGNOSIS — Z8639 Personal history of other endocrine, nutritional and metabolic disease: Secondary | ICD-10-CM | POA: Insufficient documentation

## 2015-04-11 DIAGNOSIS — Z7952 Long term (current) use of systemic steroids: Secondary | ICD-10-CM | POA: Insufficient documentation

## 2015-04-11 DIAGNOSIS — K5090 Crohn's disease, unspecified, without complications: Secondary | ICD-10-CM | POA: Insufficient documentation

## 2015-04-11 DIAGNOSIS — Z8709 Personal history of other diseases of the respiratory system: Secondary | ICD-10-CM | POA: Insufficient documentation

## 2015-04-11 LAB — COMPREHENSIVE METABOLIC PANEL
ALBUMIN: 2.5 g/dL — AB (ref 3.5–5.0)
ALT: 9 U/L — ABNORMAL LOW (ref 17–63)
AST: 13 U/L — AB (ref 15–41)
Alkaline Phosphatase: 49 U/L (ref 38–126)
Anion gap: 5 (ref 5–15)
BILIRUBIN TOTAL: 0.5 mg/dL (ref 0.3–1.2)
BUN: 9 mg/dL (ref 6–20)
CHLORIDE: 110 mmol/L (ref 101–111)
CO2: 23 mmol/L (ref 22–32)
Calcium: 8.4 mg/dL — ABNORMAL LOW (ref 8.9–10.3)
Creatinine, Ser: 1 mg/dL (ref 0.61–1.24)
GFR calc Af Amer: >60 mL/min (ref >60)
GFR calc non Af Amer: >60 mL/min (ref >60)
GLUCOSE: 95 mg/dL (ref 65–99)
POTASSIUM: 3.6 mmol/L (ref 3.5–5.1)
SODIUM: 138 mmol/L (ref 135–145)
Total Protein: 5.8 g/dL — ABNORMAL LOW (ref 6.5–8.1)

## 2015-04-11 LAB — CBC
HEMATOCRIT: 30.6 % — AB (ref 39.0–52.0)
HEMOGLOBIN: 9.4 g/dL — AB (ref 13.0–17.0)
MCH: 20.4 pg — ABNORMAL LOW (ref 26.0–34.0)
MCHC: 30.7 g/dL (ref 30.0–36.0)
MCV: 66.5 fL — AB (ref 78.0–100.0)
Platelets: 486 10*3/uL — ABNORMAL HIGH (ref 150–400)
RBC: 4.6 MIL/uL (ref 4.22–5.81)
RDW: 18.6 % — AB (ref 11.5–15.5)
WBC: 5.8 10*3/uL (ref 4.0–10.5)

## 2015-04-11 LAB — URINALYSIS, ROUTINE W REFLEX MICROSCOPIC
BILIRUBIN URINE: NEGATIVE
Glucose, UA: NEGATIVE mg/dL
Hgb urine dipstick: NEGATIVE
KETONES UR: NEGATIVE mg/dL
LEUKOCYTES UA: NEGATIVE
Nitrite: NEGATIVE
PH: 5.5 (ref 5.0–8.0)
Protein, ur: NEGATIVE mg/dL
SPECIFIC GRAVITY, URINE: 1.013 (ref 1.005–1.030)

## 2015-04-11 LAB — LIPASE, BLOOD: LIPASE: 20 U/L (ref 11–51)

## 2015-04-11 MED ORDER — SODIUM CHLORIDE 0.9 % IV SOLN
Freq: Once | INTRAVENOUS | Status: AC
  Administered 2015-04-11: 02:00:00 via INTRAVENOUS

## 2015-04-11 MED ORDER — PREDNISONE 20 MG PO TABS
60.0000 mg | ORAL_TABLET | Freq: Once | ORAL | Status: AC
  Administered 2015-04-11: 60 mg via ORAL
  Filled 2015-04-11: qty 3

## 2015-04-11 MED ORDER — HYDROMORPHONE HCL 1 MG/ML IJ SOLN
1.0000 mg | Freq: Once | INTRAMUSCULAR | Status: AC
Start: 2015-04-11 — End: 2015-04-11
  Administered 2015-04-11: 1 mg via INTRAVENOUS
  Filled 2015-04-11: qty 1

## 2015-04-11 MED ORDER — PREDNISONE 20 MG PO TABS
60.0000 mg | ORAL_TABLET | Freq: Every day | ORAL | Status: DC
Start: 2015-04-11 — End: 2015-06-16

## 2015-04-11 NOTE — ED Notes (Signed)
Pt c/o generalized abdominal pain with NVD x 2 days. Hx of crohns. Pt has been taking ibuprofen without relief. Reports poor PO intake.

## 2015-04-11 NOTE — ED Notes (Signed)
Pt. reports Crohn's flare up with mid abdominal pain , nausea and diarrhea onset 2 days ago , denies emesis,fever or chills .

## 2015-04-11 NOTE — ED Provider Notes (Signed)
CSN: [id number]     Arrival date & time 04/11/15  0015 History  By signing my name below, I, [name], attest that this documentation has been prepared under the direction and in the presence of Everlene Balls, MD. Electronically Signed: [name], ED Scribe. 04/11/2015. 2:40 AM   Chief Complaint  Patient presents with  . Abdominal Pain  . Crohn's Disease    The history is provided by the patient. No language interpreter was used.   Shane Klein is a 24 y.o. male with history of Crohn's disease who presents to the Emergency Department complaining of recurrent, sharp, lower abdominal pain with onset 2 days ago.This pain is typical of a flare up of his Crohn's disease and he has a flare up at least once per month. Ibuprofen provided insufficient pain relief at home.  Associated symptoms include nausea and non-bloody diarrhea. Pt denies vomiting.   Past Medical History  Diagnosis Date  . 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 Iron deficiency anemia   . GERD (gastroesophageal reflux disease)   . ANEMIA-IRON DEFICIENCY 04/02/2009  . Protein-calorie malnutrition, severe ([place]) 04/15/2013  . GI bleed 04/22/2013    EGD by Dr Teena Irani unremarkable  . Crohn's disease ([place]) 2010    by colon:cecum and ascending colon, by EGD: duodenum, by imaging also in ileum. No villous atrophy on duodenal biopsies.   . SBO (small bowel obstruction) ([place]) 2013    in [place] in area of active Crohn's.    Past Surgical History  Procedure Laterality Date  . Esophagogastroduodenoscopy  Oct 2010    Dr. Fuller Plan: duodenitis, normal villi, likely Crohn's. Elevated Gliadin but normal TTG, IgA and IgA  . Colonoscopy  Oct 2010    Dr. Fuller Plan: evidence of Crohn's at cecum, ascending colon, unable to intubate the terminal ileum. CTE with distal and terminal ileitis  . Esophagogastroduodenoscopy N/A 04/24/2013    Procedure: ESOPHAGOGASTRODUODENOSCOPY (EGD);  Surgeon: Missy Sabins, MD;  Location: [facility] ENDOSCOPY;  Service: Endoscopy;   Laterality: N/A;   Family History  Problem Relation Age of Onset  . Diabetes Maternal Aunt   . Diabetes Maternal Grandmother   . Colon cancer Neg Hx   . Crohn's disease Maternal Aunt   . Hypertension     Social History  Substance Use Topics  . Smoking status: Current Every Day Smoker -- 0.00 packs/day for 2 years    Types: Cigarettes  . Smokeless tobacco: Never Used  . Alcohol Use: Yes     Comment: occasionally    Review of Systems 10 Systems reviewed and all are negative for acute change except as noted in the HPI. Allergies  Gluten meal and Wheat bran  Home Medications   Prior to Admission medications   Medication Sig Start Date End Date Taking? Authorizing Provider  azithromycin (ZITHROMAX) 250 MG tablet Take 1 tablet (250 mg total) by mouth daily. Patient not taking: Reported on 03/16/2015 12/21/14   Everlene Balls, MD  ferrous sulfate 325 (65 FE) MG tablet Take 1 tablet (325 mg total) by mouth daily with breakfast. Patient not taking: Reported on 11/08/2014 08/06/14   Jones Bales, MD  oxyCODONE (OXY IR/ROXICODONE) 5 MG immediate release tablet Take 1 tablet (5 mg total) by mouth every 4 (four) hours as needed for severe pain. Patient not taking: Reported on 11/08/2014 08/04/14   Carmin Muskrat, MD  predniSONE (STERAPRED UNI-PAK 21 TAB) 10 MG (21) TBPK tablet Take 1 tablet (10 mg total) by mouth daily. Take 5 tabs for  2 days, then 4 tabs for 2 days, then 3 tabs for 2 days, 2 tabs for 2 days, then 1 tab by mouth daily for 2 days 03/16/15   Ottie Glazier, PA-C  traMADol (ULTRAM) 50 MG tablet Take 1 tablet (50 mg total) by mouth every 6 (six) hours as needed. 03/16/15   Hanna Patel-Mills, PA-C   BP 127/79 mmHg  Pulse 79  Temp(Src) 98.7 F (37.1 C) (Oral)  Resp 18  SpO2 98% Physical Exam  Constitutional: He is oriented to person, place, and time. Vital signs are normal. He appears well-developed and well-nourished.  Non-toxic appearance. He does not appear ill. No  distress.  HENT:  Head: Normocephalic and atraumatic.  Nose: Nose normal.  Mouth/Throat: Oropharynx is clear and moist. No oropharyngeal exudate.  Eyes: Conjunctivae and EOM are normal. Pupils are equal, round, and reactive to light. No scleral icterus.  Neck: Normal range of motion. Neck supple. No tracheal deviation, no edema, no erythema and normal range of motion present. No thyroid mass and no thyromegaly present.  Cardiovascular: Normal rate, regular rhythm, S1 normal, S2 normal, normal heart sounds, intact distal pulses and normal pulses.  Exam reveals no gallop and no friction rub.   No murmur heard. Pulmonary/Chest: Effort normal and breath sounds normal. No respiratory distress. He has no wheezes. He has no rhonchi. He has no rales.  Abdominal: Soft. Normal appearance and bowel sounds are normal. He exhibits no distension, no ascites and no mass. There is no hepatosplenomegaly. There is tenderness in the left lower quadrant. There is no rebound, no guarding and no CVA tenderness.  Musculoskeletal: Normal range of motion. He exhibits no edema or tenderness.  Lymphadenopathy:    He has no cervical adenopathy.  Neurological: He is alert and oriented to person, place, and time. He has normal strength. No cranial nerve deficit or sensory deficit.  Skin: Skin is warm, dry and intact. No petechiae and no rash noted. He is not diaphoretic. No erythema. No pallor.  Psychiatric: He has a normal mood and affect. His behavior is normal. Judgment normal.  Nursing note and vitals reviewed.   ED Course  Procedures (including critical care time) DIAGNOSTIC STUDIES: Oxygen Saturation is 98% on RA, normal by my interpretation.    COORDINATION OF CARE: 2:25 AM Discussed treatment plan which includes lab work, pain management, and steroids with pt at bedside and pt agreed to plan.  Labs Review Labs Reviewed  COMPREHENSIVE METABOLIC PANEL - Abnormal; Notable for the following:    Calcium 8.4 (*)     Total Protein 5.8 (*)    Albumin 2.5 (*)    AST 13 (*)    ALT 9 (*)    All other components within normal limits  CBC - Abnormal; Notable for the following:    Hemoglobin 9.4 (*)    HCT 30.6 (*)    MCV 66.5 (*)    MCH 20.4 (*)    RDW 18.6 (*)    Platelets 486 (*)    All other components within normal limits  LIPASE, BLOOD  URINALYSIS, ROUTINE W REFLEX MICROSCOPIC (NOT AT [facility])    Imaging Review No results found. I have personally reviewed and evaluated these lab results as part of my medical decision-making.   EKG Interpretation None      MDM   Final diagnoses:  None   Patient presents to the emergency department for evaluation of abdominal pain. He states this is consistent with his Crohn's disease. He was given  Dilaudid and prednisone for treatment. Upon repeat evaluation, patient states his pain has significantly improved. Abdomen is no longer tender. We'll discharge with prednisone to take for 5 days. Primary care follow-up encouraged. He appears well-developed and acute distress, vital signs were within his normal limits and he is safe for discharge.    I personally performed the services described in this documentation, which was scribed in my presence. The recorded information has been reviewed and is accurate.      Everlene Balls, MD 04/11/15 [phone]

## 2015-04-11 NOTE — ED Notes (Signed)
Patient verbalized understanding of discharge instructions and denies any further needs or questions at this time. VS stable, patient ambulatory with steady gait. 

## 2015-04-11 NOTE — Discharge Instructions (Signed)
Crohn Disease Shane Klein, take prednisone for the next 5 days for treatment. See a gastroenterologist within 3 days for close follow-up. If any symptoms worsen come back to emergency department immediately. Thank you. Crohn disease is a long-lasting (chronic) disease that affects your gastrointestinal (GI) tract. It often causes irritation and swelling (inflammation) in your small intestine and the beginning of your large intestine. However, it can affect any part of your GI tract. Crohn disease is part of a group of illnesses that are known as inflammatory bowel disease (IBD). Crohn disease may start slowly and get worse over time. Symptoms may come and go. They may also disappear for months or even years at a time (remission). CAUSES The exact cause of Crohn disease is not known. It may be a response that causes your body's defense system (immune system) to mistakenly attack healthy cells and tissues (autoimmune response). Your genes and your environment may also play a role. RISK FACTORS You may be at greater risk for Crohn disease if you:  Have other family members with Crohn disease or another IBD.  Use any tobacco products, including cigarettes, chewing tobacco, or electronic cigarettes.  Are in your 34s.  Have [place] European ancestry. SIGNS AND SYMPTOMS The main signs and symptoms of Crohn disease involve your GI tract. These include:  Diarrhea.  Rectal bleeding.  An urgent need to move your bowels.  The feeling that you are not finished having a bowel movement.  Abdominal pain or cramping.  Constipation. General signs and symptoms of Crohn disease may also include:  Unexplained weight loss.  Fatigue.  Fever.  Nausea.  Loss of appetite.  Joint pain  Changes in vision.  Red bumps on your skin. DIAGNOSIS Your health care provider may suspect Crohn disease based on your symptoms and your medical history. Your health care provider will do a physical exam. You may  need to see a health care provider who specializes in diseases of the digestive tract (gastroenterologist). You may also have tests to help your health care providers make a diagnosis. These may include:  Blood tests.  Stool sample tests.  Imaging tests, such as X-rays and CT scans.  Tests to examine the inside of your intestines using a long, flexible tube that has a light and a camera on the end (endoscopy or colonoscopy).  A procedure to take tissue samples from inside your bowel (biopsy) to be examined under a microscope. TREATMENT  There is no cure for Crohn disease. Treatment will focus on managing your symptoms. Crohn disease affects each person differently. Your treatment may include:  Resting your bowels. Drinking only clear liquids or getting nutrition through an IV for a period of time gives your bowels a chance to heal because they are not passing stools.  Medicines. These may be used alone or in combination (combination therapy). These may include antibiotic medicines. You may be given medicines that help to:  Reduce inflammation.  Control your immune system activity.  Fight infections.  Relieve cramps and prevent diarrhea.  Control your pain.  Surgery. You may need surgery if:  Medicines and other treatments are no longer working.  You develop complications from severe Crohn disease.  A section of your intestine becomes so damaged that it needs to be removed. HOME CARE INSTRUCTIONS  Take medicines only as directed by your health care provider.  If you were prescribed an antibiotic medicine, finish it all even if you start to feel better.  Keep all follow-up visits as directed  by your health care provider. This is important.  Talk with your health care provider about changing your diet. This may help your symptoms. Your health care provide may recommend changes, such as:  Drinking more fluids.  Avoiding milk and other foods that contain lactose.  Eating a  low-fat diet.  Avoiding high-fiber foods, such as popcorn and nuts.  Avoiding carbonated beverages, such as soda.  Eating smaller meals more often rather than eating large meals.  Keeping a food diary to identify foods that make your symptoms better or worse.  Do not use any tobacco products, including cigarettes, chewing tobacco, or electronic cigarettes. If you need help quitting, ask your health care provider.  Limit alcohol intake to no more than 1 drink per day for nonpregnant women and 2 drinks per day for men. One drink equals 12 ounces of beer, 5 ounces of wine, or 1 ounces of hard liquor.  Exercise daily or as directed by your health care provider. SEEK MEDICAL CARE IF:  You have diarrhea, abdominal cramps, and other gastrointestinal problems that are present almost all of the time.  Your symptoms do not improve with treatment.  You continue to lose weight.  You develop a rash or sores on your skin.  You develop eye problems.  You have a fever.   Your symptoms get worse.  You develop new symptoms. SEEK IMMEDIATE MEDICAL CARE IF:  You have bloody diarrhea.  You develop severe abdominal pain.  You cannot pass stools.   This information is not intended to replace advice given to you by your health care provider. Make sure you discuss any questions you have with your health care provider.   Document Released: 01/20/2005 Document Revised: 05/03/2014 Document Reviewed: 11/28/2013 Elsevier Interactive Patient Education [employer].

## 2015-06-10 ENCOUNTER — Encounter (HOSPITAL_COMMUNITY): Payer: Self-pay | Admitting: Emergency Medicine

## 2015-06-10 ENCOUNTER — Emergency Department (HOSPITAL_COMMUNITY)
Admission: EM | Admit: 2015-06-10 | Discharge: 2015-06-10 | Disposition: A | Discharge status: Home / Self Care | Attending: Emergency Medicine | Admitting: Emergency Medicine

## 2015-06-10 DIAGNOSIS — Z7952 Long term (current) use of systemic steroids: Secondary | ICD-10-CM | POA: Insufficient documentation

## 2015-06-10 DIAGNOSIS — K029 Dental caries, unspecified: Secondary | ICD-10-CM | POA: Insufficient documentation

## 2015-06-10 DIAGNOSIS — K0889 Other specified disorders of teeth and supporting structures: Secondary | ICD-10-CM | POA: Insufficient documentation

## 2015-06-10 DIAGNOSIS — F17210 Nicotine dependence, cigarettes, uncomplicated: Secondary | ICD-10-CM | POA: Insufficient documentation

## 2015-06-10 DIAGNOSIS — Z862 Personal history of diseases of the blood and blood-forming organs and certain disorders involving the immune mechanism: Secondary | ICD-10-CM | POA: Insufficient documentation

## 2015-06-10 MED ORDER — PENICILLIN V POTASSIUM 500 MG PO TABS: 500.0000 mg | ORAL_TABLET | Freq: Four times a day (QID) | ORAL | Status: DC

## 2015-06-10 NOTE — Discharge Instructions (Signed)
[facility] [employer] is a great source of information about community services available.  Access by dialing 2-1-1 from anywhere in [place], or by website -  CustodianSupply.fi.   Other Local Resources (Updated 04/2015)  Dental  Care   Services    Phone Number and Address  Cost  [facility] For children 62 - 25 years of age:   Cleaning  Tooth brushing/flossing instruction  Sealants, fillings, crowns  Extractions  Emergency treatment  [phone] [address], [place] [address] Charges based on family income.  Medicaid and some insurance plans accepted.     [facility] - [facility], fillings, crowns  Extractions  Emergency treatment [phone] [address] [place], [place]  Pregnant women 39 years of age or older with a Medicaid card  [facility] - [place]  Cleaning  Sealants, fillings, crowns  Extractions  Emergency treatment [phone] [address] [place], [place] Pregnant women 73 years of age or older with a Medicaid card  [facility] For children 43 - 33 years of age:   Cleaning  Tooth brushing/flossing instruction  Sealants, fillings, crowns  Extractions  Emergency treatment Limited orthodontic services for patients with Medicaid [phone], [place] [address] Medicaid and [facility] Health Choice cover for children up to age 71 and pregnant women.  Parents of children up to age 47 without Medicaid pay a reduced fee at time of service.  [facility] For children 88 - 33 years of age:   Cleaning  Tooth brushing/flossing instruction  Sealants, fillings, crowns  Extractions  Emergency treatment Limited orthodontic services for patients with Medicaid  [phone] [place], [place].  Medicaid and [facility] [place] Health Choice cover for children up to age 26 and pregnant women.  Parents of children up to age 42 without Medicaid pay a reduced fee.  [facility]  Sealants, fillings, crowns  Extractions  Hours: Tuesdays and Thursdays, 4:15 - 8 pm [phone] [address] [place], [place] [address] Services free of charge to [facility] residents ages 18-64 who do not have health insurance, Medicare, [place], or [place] benefits and fall within federal poverty guidelines  [place] care in addition to primary medical care, nutritional counseling, and pharmacy:  Engineer, drilling, fillings, crowns  Extractions                  [phone] [facility] [place], [place], [place], [place], [place], [place] [facility], [place], [place] [facility] [place], [place], [place], most insurance.  Also provides services available to all with fees adjusted based on ability to pay.    [facility]  Cleaning  Tooth brushing/flossing instruction  Sealants, fillings, crowns  Extractions  Emergency treatment Hours: Tuesdays, Thursdays, and Fridays from 8 am to 5 pm by appointment only. [phone] [place], [place] [address] [facility] residents with Medicaid (depending on eligibility) and children with [facility] Health Choice - call for more information.  [facility] • Extractions only ° °Hours: 2nd and 4th Thursday of each month from 6:30 am - 9 am.   [phone] ext. [phone] °[place], [place] [address] Ages 18 and older only.  Patients are seen on a first come, first served basis.  °[facility] • Cleanings °• Fillings °• Extractions °• Orthodontics °• Endodontics °• Implants/Crowns/Bridges °• Complete and partial dentures [phone] °[place], [place] Patients must complete an application for services.  There is often a waiting list.   ° °

## 2015-06-10 NOTE — ED Notes (Signed)
C/o right lower dental pain. Pt appears very drowsy. When asked, he states. "lack of sleep". Pt has hx of Crohn's dz but is not receiving medical care.

## 2015-06-10 NOTE — ED Provider Notes (Signed)
CSN: [id number]     Arrival date & time 06/10/15  0905 History  By signing my name below, I, Evelene Croon, attest that this documentation has been prepared under the direction and in the presence of non-physician practitioner, Montine Circle, PA-C. Electronically Signed: Evelene Croon, Scribe. 06/10/2015. 9:58 AM.    Chief Complaint  Patient presents with  . Dental Pain    The history is provided by the patient. No language interpreter was used.     HPI Comments:  Shane Klein is a 25 y.o. male who presents to the Emergency Department complaining of moderate constant pain to his right lower rear molars  x 2 days. He denies fever. Pt has taken tylenol with minimal relief. Pt has no other complaints or symptoms at this time.  He doesn't have a Pharmacist, community.   Past Medical History  Diagnosis Date  . 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 Iron deficiency anemia   . GERD (gastroesophageal reflux disease)   . ANEMIA-IRON DEFICIENCY 04/02/2009  . Protein-calorie malnutrition, severe ([place]) 04/15/2013  . GI bleed 04/22/2013    EGD by Dr Teena Irani unremarkable  . Crohn's disease ([place]) 2010    by colon:cecum and ascending colon, by EGD: duodenum, by imaging also in ileum. No villous atrophy on duodenal biopsies.   . SBO (small bowel obstruction) ([place]) 2013    in TI in area of active Crohn's.    Past Surgical History  Procedure Laterality Date  . Esophagogastroduodenoscopy  Oct 2010    Dr. Fuller Plan: duodenitis, normal villi, likely Crohn's. Elevated Gliadin but normal TTG, IgA and IgA  . Colonoscopy  Oct 2010    Dr. Fuller Plan: evidence of Crohn's at cecum, ascending colon, unable to intubate the terminal ileum. CTE with distal and terminal ileitis  . Esophagogastroduodenoscopy N/A 04/24/2013    Procedure: ESOPHAGOGASTRODUODENOSCOPY (EGD);  Surgeon: Missy Sabins, MD;  Location: [facility] ENDOSCOPY;  Service: Endoscopy;  Laterality: N/A;   Family History  Problem Relation Age of Onset  . Diabetes Maternal Aunt   .  Diabetes Maternal Grandmother   . Colon cancer Neg Hx   . Crohn's disease Maternal Aunt   . Hypertension     Social History  Substance Use Topics  . Smoking status: Current Every Day Smoker -- 0.00 packs/day for 2 years    Types: Cigarettes  . Smokeless tobacco: Never Used  . Alcohol Use: Yes     Comment: occasionally    Review of Systems  Constitutional: Negative for fever and chills.  HENT: Positive for dental problem. Negative for drooling and trouble swallowing.   Respiratory: Negative for shortness of breath.   Cardiovascular: Negative for chest pain.    Allergies  Gluten meal and Wheat bran  Home Medications   Prior to Admission medications   Medication Sig Start Date End Date Taking? Authorizing Provider  penicillin v potassium (VEETID) 500 MG tablet Take 1 tablet (500 mg total) by mouth 4 (four) times daily. 06/10/15   Montine Circle, PA-C  predniSONE (DELTASONE) 20 MG tablet Take 3 tablets (60 mg total) by mouth daily. 04/11/15   Everlene Balls, MD  sulfaSALAzine (AZULFIDINE) 500 MG tablet Take 500 mg by mouth 2 (two) times daily.    Historical Provider, MD   BP 144/91 mmHg  Pulse 71  Temp(Src) 98.5 F (36.9 C) (Oral)  Resp 16  Ht 5\' 11"  (1.803 m)  Wt 165 lb (74.844 kg)  BMI 23.02 kg/m2  SpO2 100% Physical Exam Constitutional: Pt appears well-developed and well-nourished.  HENT:  Head: Normocephalic.  Right Ear: Tympanic membrane, external ear and ear canal normal.  Left Ear: Tympanic membrane, external ear and ear canal normal.  Nose: Nose normal. Right sinus exhibits no maxillary sinus tenderness and no frontal sinus tenderness. Left sinus exhibits no maxillary sinus tenderness and no frontal sinus tenderness.  Mouth/Throat: Uvula is midline, oropharynx is clear and moist and mucous membranes are normal. No oral lesions. Abnormal dentition. Dental caries present. No uvula swelling or lacerations. No oropharyngeal exudate, posterior oropharyngeal edema,  posterior oropharyngeal erythema or tonsillar abscesses.  Poor dentition throughout.   No signs of peritonsillar or tonsillar abscess.  No signs of gingival abscess. Oropharynx is clear and without exudates.  Uvula is midline.  Airway is intact. No signs of Ludwig's angina with palpation of oral and sublingual mucosa.  Eyes: Conjunctivae are normal. Pupils are equal, round, and reactive to light. Right eye exhibits no discharge. Left eye exhibits no discharge.  Neck: Normal range of motion. Neck supple.  No stridor Handling secretions without difficulty No nuchal rigidity No cervical lymphadenopathy Cardiovascular: Normal rate, regular rhythm and normal heart sounds.   Pulmonary/Chest: Effort normal. No respiratory distress.  Equal chest rise  Abdominal: Soft. Bowel sounds are normal. Pt exhibits no distension. There is no tenderness.  Lymphadenopathy:    Pt has no cervical adenopathy.  Neurological: Pt is alert.  Skin: Skin is warm and dry.  Psychiatric: Pt has a normal mood and affect.  Nursing note and vitals reviewed. ; ED Course  Procedures   DIAGNOSTIC STUDIES:  Oxygen Saturation is 100% on RA, normal by my interpretation.    COORDINATION OF CARE:  9:53 AM Advised pt to follow up with dentist. WIll discharge with antibiotic and dental referral. Discussed treatment plan with pt at bedside and pt agreed to plan.   MDM   Final diagnoses:  Pain, dental      Patient with dentalgia.  No abscess requiring immediate incision and drainage.  Exam not concerning for Ludwig's angina or pharyngeal abscess.  Will treat with PCN. Pt instructed to follow-up with dentist.  Discussed return precautions. Pt safe for discharge.  I personally performed the services described in this documentation, which was scribed in my presence. The recorded information has been reviewed and is accurate.     Montine Circle, PA-C 06/10/15 1000  Elnora Morrison, MD 06/10/15 1346

## 2015-06-15 ENCOUNTER — Encounter (HOSPITAL_COMMUNITY): Payer: Self-pay | Admitting: Emergency Medicine

## 2015-06-15 DIAGNOSIS — Z792 Long term (current) use of antibiotics: Secondary | ICD-10-CM | POA: Insufficient documentation

## 2015-06-15 DIAGNOSIS — Z8719 Personal history of other diseases of the digestive system: Secondary | ICD-10-CM | POA: Insufficient documentation

## 2015-06-15 DIAGNOSIS — J069 Acute upper respiratory infection, unspecified: Secondary | ICD-10-CM | POA: Insufficient documentation

## 2015-06-15 DIAGNOSIS — Z862 Personal history of diseases of the blood and blood-forming organs and certain disorders involving the immune mechanism: Secondary | ICD-10-CM | POA: Insufficient documentation

## 2015-06-15 DIAGNOSIS — Z8639 Personal history of other endocrine, nutritional and metabolic disease: Secondary | ICD-10-CM | POA: Insufficient documentation

## 2015-06-15 DIAGNOSIS — F17210 Nicotine dependence, cigarettes, uncomplicated: Secondary | ICD-10-CM | POA: Insufficient documentation

## 2015-06-15 LAB — CBC
HCT: 29.2 % — ABNORMAL LOW (ref 39.0–52.0)
Hemoglobin: 9.2 g/dL — ABNORMAL LOW (ref 13.0–17.0)
MCH: 22 pg — AB (ref 26.0–34.0)
MCHC: 31.5 g/dL (ref 30.0–36.0)
MCV: 69.9 fL — AB (ref 78.0–100.0)
PLATELETS: 450 10*3/uL — AB (ref 150–400)
RBC: 4.18 MIL/uL — ABNORMAL LOW (ref 4.22–5.81)
RDW: 19.8 % — AB (ref 11.5–15.5)
WBC: 7.9 10*3/uL (ref 4.0–10.5)

## 2015-06-15 LAB — BASIC METABOLIC PANEL
Anion gap: 9 (ref 5–15)
BUN: 9 mg/dL (ref 6–20)
CHLORIDE: 103 mmol/L (ref 101–111)
CO2: 21 mmol/L — AB (ref 22–32)
CREATININE: 1.19 mg/dL (ref 0.61–1.24)
Calcium: 7.9 mg/dL — ABNORMAL LOW (ref 8.9–10.3)
GFR calc Af Amer: >60 mL/min (ref >60)
GFR calc non Af Amer: >60 mL/min (ref >60)
GLUCOSE: 97 mg/dL (ref 65–99)
Potassium: 3.4 mmol/L — ABNORMAL LOW (ref 3.5–5.1)
SODIUM: 133 mmol/L — AB (ref 135–145)

## 2015-06-15 NOTE — ED Notes (Signed)
Pt. reports migraine headache with chills onset today , denies nausea , fever or photophobia .

## 2015-06-16 ENCOUNTER — Emergency Department (HOSPITAL_COMMUNITY)
Admission: EM | Admit: 2015-06-16 | Discharge: 2015-06-16 | Disposition: A | Discharge status: Home / Self Care | Attending: Emergency Medicine | Admitting: Emergency Medicine

## 2015-06-16 DIAGNOSIS — R51 Headache: Secondary | ICD-10-CM

## 2015-06-16 DIAGNOSIS — R519 Headache, unspecified: Secondary | ICD-10-CM

## 2015-06-16 DIAGNOSIS — J069 Acute upper respiratory infection, unspecified: Secondary | ICD-10-CM

## 2015-06-16 LAB — RAPID STREP SCREEN (MED CTR MEBANE ONLY): STREPTOCOCCUS, GROUP A SCREEN (DIRECT): NEGATIVE

## 2015-06-16 MED ORDER — IBUPROFEN 600 MG PO TABS: 600.0000 mg | ORAL_TABLET | Freq: Four times a day (QID) | ORAL | Status: DC | PRN

## 2015-06-16 MED ORDER — KETOROLAC TROMETHAMINE 30 MG/ML IJ SOLN
30.0000 mg | Freq: Once | INTRAMUSCULAR | Status: AC
  Administered 2015-06-16: 30 mg via INTRAMUSCULAR
  Filled 2015-06-16: qty 1

## 2015-06-16 NOTE — Discharge Instructions (Signed)
Upper Respiratory Infection, Adult Most upper respiratory infections (URIs) are a viral infection of the air passages leading to the lungs. A URI affects the nose, throat, and upper air passages. The most common type of URI is nasopharyngitis and is typically referred to as "the common cold." URIs run their course and usually go away on their own. Most of the time, a URI does not require medical attention, but sometimes a bacterial infection in the upper airways can follow a viral infection. This is called a secondary infection. Sinus and middle ear infections are common types of secondary upper respiratory infections. Bacterial pneumonia can also complicate a URI. A URI can worsen asthma and chronic obstructive pulmonary disease (COPD). Sometimes, these complications can require emergency medical care and may be life threatening.  CAUSES Almost all URIs are caused by viruses. A virus is a type of germ and can spread from one person to another.  RISKS FACTORS You may be at risk for a URI if:   You smoke.   You have chronic heart or lung disease.  You have a weakened defense (immune) system.   You are very young or very old.   You have nasal allergies or asthma.  You work in crowded or poorly ventilated areas.  You work in health care facilities or schools. SIGNS AND SYMPTOMS  Symptoms typically develop 2-3 days after you come in contact with a cold virus. Most viral URIs last 7-10 days. However, viral URIs from the influenza virus (flu virus) can last 14-18 days and are typically more severe. Symptoms may include:   Runny or stuffy (congested) nose.   Sneezing.   Cough.   Sore throat.   Headache.   Fatigue.   Fever.   Loss of appetite.   Pain in your forehead, behind your eyes, and over your cheekbones (sinus pain).  Muscle aches.  DIAGNOSIS  Your health care provider may diagnose a URI by:  Physical exam.  Tests to check that your symptoms are not due to  another condition such as:  Strep throat.  Sinusitis.  Pneumonia.  Asthma. TREATMENT  A URI goes away on its own with time. It cannot be cured with medicines, but medicines may be prescribed or recommended to relieve symptoms. Medicines may help:  Reduce your fever.  Reduce your cough.  Relieve nasal congestion. HOME CARE INSTRUCTIONS   Take medicines only as directed by your health care provider.   Gargle warm saltwater or take cough drops to comfort your throat as directed by your health care provider.  Use a warm mist humidifier or inhale steam from a shower to increase air moisture. This may make it easier to breathe.  Drink enough fluid to keep your urine clear or pale yellow.   Eat soups and other clear broths and maintain good nutrition.   Rest as needed.   Return to work when your temperature has returned to normal or as your health care provider advises. You may need to stay home longer to avoid infecting others. You can also use a face mask and careful hand washing to prevent spread of the virus.  Increase the usage of your inhaler if you have asthma.   Do not use any tobacco products, including cigarettes, chewing tobacco, or electronic cigarettes. If you need help quitting, ask your health care provider. PREVENTION  The best way to protect yourself from getting a cold is to practice good hygiene.   Avoid oral or hand contact with people with cold  symptoms.   Wash your hands often if contact occurs.  There is no clear evidence that vitamin C, vitamin E, echinacea, or exercise reduces the chance of developing a cold. However, it is always recommended to get plenty of rest, exercise, and practice good nutrition.  SEEK MEDICAL CARE IF:   You are getting worse rather than better.   Your symptoms are not controlled by medicine.   You have chills.  You have worsening shortness of breath.  You have brown or red mucus.  You have yellow or brown nasal  discharge.  You have pain in your face, especially when you bend forward.  You have a fever.  You have swollen neck glands.  You have pain while swallowing.  You have white areas in the back of your throat. SEEK IMMEDIATE MEDICAL CARE IF:   You have severe or persistent:  Headache.  Ear pain.  Sinus pain.  Chest pain.  You have chronic lung disease and any of the following:  Wheezing.  Prolonged cough.  Coughing up blood.  A change in your usual mucus.  You have a stiff neck.  You have changes in your:  Vision.  Hearing.  Thinking.  Mood. MAKE SURE YOU:   Understand these instructions.  Will watch your condition.  Will get help right away if you are not doing well or get worse.   This information is not intended to replace advice given to you by your health care provider. Make sure you discuss any questions you have with your health care provider.   Document Released: 10/06/2000 Document Revised: 08/27/2014 Document Reviewed: 07/18/2013 Elsevier Interactive Patient Education 2016 [place] Headache Without Cause A headache is pain or discomfort felt around the head or neck area. The specific cause of a headache may not be found. There are many causes and types of headaches. A few common ones are:  Tension headaches.  Migraine headaches.  Cluster headaches.  Chronic daily headaches. HOME CARE INSTRUCTIONS  Watch your condition for any changes. Take these steps to help with your condition: Managing Pain  Take over-the-counter and prescription medicines only as told by your health care provider.  Lie down in a dark, quiet room when you have a headache.  If directed, apply ice to the head and neck area:  Put ice in a plastic bag.  Place a towel between your skin and the bag.  Leave the ice on for 20 minutes, 2-3 times per day.  Use a heating pad or hot shower to apply heat to the head and neck area as told by your health care  provider.  Keep lights dim if bright lights bother you or make your headaches worse. Eating and Drinking  Eat meals on a regular schedule.  Limit alcohol use.  Decrease the amount of caffeine you drink, or stop drinking caffeine. General Instructions  Keep all follow-up visits as told by your health care provider. This is important.  Keep a headache journal to help find out what may trigger your headaches. For example, write down:  What you eat and drink.  How much sleep you get.  Any change to your diet or medicines.  Try massage or other relaxation techniques.  Limit stress.  Sit up straight, and do not tense your muscles.  Do not use tobacco products, including cigarettes, chewing tobacco, or e-cigarettes. If you need help quitting, ask your health care provider.  Exercise regularly as told by your health care provider.  Sleep on a  regular schedule. Get 7-9 hours of sleep, or the amount recommended by your health care provider. SEEK MEDICAL CARE IF:   Your symptoms are not helped by medicine.  You have a headache that is different from the usual headache.  You have nausea or you vomit.  You have a fever. SEEK IMMEDIATE MEDICAL CARE IF:   Your headache becomes severe.  You have repeated vomiting.  You have a stiff neck.  You have a loss of vision.  You have problems with speech.  You have pain in the eye or ear.  You have muscular weakness or loss of muscle control.  You lose your balance or have trouble walking.  You feel faint or pass out.  You have confusion.   This information is not intended to replace advice given to you by your health care provider. Make sure you discuss any questions you have with your health care provider.   Document Released: 04/12/2005 Document Revised: 01/01/2015 Document Reviewed: 08/05/2014 Elsevier Interactive Patient Education [employer].

## 2015-06-16 NOTE — ED Notes (Signed)
Pt reports stiff neck earlier. Pt has since resolved

## 2015-06-16 NOTE — ED Notes (Signed)
Pt reporting abdominal pain but states he does have chron's disease.

## 2015-06-16 NOTE — ED Provider Notes (Signed)
CSN: [id number]     Arrival date & time 06/15/15  2213 History  By signing my name below, I, [name], attest that this documentation has been prepared under the direction and in the presence of Merryl Hacker, MD. Electronically Signed: [name], ED Scribe. 06/16/2015. 3:41 AM   Chief Complaint  Patient presents with  . Migraine  . Chills    The history is provided by the patient. No language interpreter was used.   Shane Klein is a 25 y.o. male with history of Crohn's disease who presents to the Emergency Department complaining of a new, constant, 5/10 in severity, posterior headache with gradual onset yesterday. Pt has no history of migraines and does not get frequent headaches. Associated symptoms include subjective fever and chills. He denies sore throat but states that he intermittently feels as if his throat is closing.  Pt also denies vision change, photophobia, numbness/tingling, weakness, neck stiffness, cough, new SOB, vomiting, diarrhea, and new abdominal pain (notes chronic pain related to crohn's disease that he is not on medication for). No known sick contact.   Past Medical History  Diagnosis Date  . 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 Iron deficiency anemia   . GERD (gastroesophageal reflux disease)   . ANEMIA-IRON DEFICIENCY 04/02/2009  . Protein-calorie malnutrition, severe ([place]) 04/15/2013  . GI bleed 04/22/2013    EGD by Dr Teena Irani unremarkable  . Crohn's disease ([place]) 2010    by colon:cecum and ascending colon, by EGD: duodenum, by imaging also in ileum. No villous atrophy on duodenal biopsies.   . SBO (small bowel obstruction) ([place]) 2013    in TI in area of active Crohn's.    Past Surgical History  Procedure Laterality Date  . Esophagogastroduodenoscopy  Oct 2010    Dr. Fuller Plan: duodenitis, normal villi, likely Crohn's. Elevated Gliadin but normal TTG, IgA and IgA  . Colonoscopy  Oct 2010    Dr. Fuller Plan: evidence of Crohn's at cecum, ascending colon,  unable to intubate the terminal ileum. CTE with distal and terminal ileitis  . Esophagogastroduodenoscopy N/A 04/24/2013    Procedure: ESOPHAGOGASTRODUODENOSCOPY (EGD);  Surgeon: Missy Sabins, MD;  Location[facility] ENDOSCOPY;  Service: Endoscopy;  Laterality: N/A;   Family History  Problem Relation Age of Onset  . Diabetes Maternal Aunt   . Diabetes Maternal Grandmother   . Colon cancer Neg Hx   . Crohn's disease Maternal Aunt   . Hypertension     Social History  Substance Use Topics  . Smoking status: Current Every Day Smoker -- 0.00 packs/day for 2 years    Types: Cigarettes  . Smokeless tobacco: Never Used  . Alcohol Use: Yes     Comment: occasionally    Review of Systems  Constitutional: Positive for fever (subjective) and chills.  HENT: Negative for sore throat and trouble swallowing.        Fullness in the throat  Eyes: Negative for photophobia and visual disturbance.  Respiratory: Negative for cough and shortness of breath.   Cardiovascular: Negative for chest pain.  Gastrointestinal: Negative for nausea, vomiting, abdominal pain (nothing acute) and diarrhea.  Musculoskeletal: Negative for neck stiffness.  Neurological: Positive for headaches. Negative for dizziness, weakness and numbness.  All other systems reviewed and are negative.  Allergies  Gluten meal and Wheat bran  Home Medications   Prior to Admission medications   Medication Sig Start Date End Date Taking? Authorizing Provider  penicillin v potassium (VEETID) 500 MG tablet Take 1 tablet (500 mg  total) by mouth 4 (four) times daily. 06/10/15  Yes Montine Circle, PA-C  ibuprofen (ADVIL,MOTRIN) 600 MG tablet Take 1 tablet (600 mg total) by mouth every 6 (six) hours as needed. 06/16/15   Merryl Hacker, MD   BP 137/89 mmHg  Pulse 70  Temp(Src) 99.3 F (37.4 C) (Oral)  Resp 18  Wt 187 lb 3.2 oz (84.913 kg)  SpO2 100% Physical Exam  Constitutional: He is oriented to person, place, and time. He appears  well-developed and well-nourished. No distress.  HENT:  Head: Normocephalic and atraumatic.  Mild erythema and symmetric tonsillar enlargement, no tonsillar exudate, uvula midline, mild erythema  Eyes: EOM are normal. Pupils are equal, round, and reactive to light.  Neck: Normal range of motion.  Cardiovascular: Normal rate, regular rhythm and normal heart sounds.   No murmur heard. Pulmonary/Chest: Effort normal and breath sounds normal. No respiratory distress. He has no wheezes.  Abdominal: Soft. Bowel sounds are normal. There is no tenderness. There is no rebound.  Musculoskeletal: He exhibits no edema.  Neurological: He is alert and oriented to person, place, and time.  Cranial nerves II through XII intact, 5 out of 5 strength in all 4 extremities  Skin: Skin is warm and dry.  Psychiatric: He has a normal mood and affect.  Nursing note and vitals reviewed.   ED Course  Procedures (including critical care time) DIAGNOSTIC STUDIES: Oxygen Saturation is 100% on RA,  normal by my interpretation.    COORDINATION OF CARE: 3:34 AM Discussed treatment plan which includes lab work with pt at bedside and pt agreed to plan.  Labs Review Labs Reviewed  BASIC METABOLIC PANEL - Abnormal; Notable for the following:    Sodium 133 (*)    Potassium 3.4 (*)    CO2 21 (*)    Calcium 7.9 (*)    All other components within normal limits  CBC - Abnormal; Notable for the following:    RBC 4.18 (*)    Hemoglobin 9.2 (*)    HCT 29.2 (*)    MCV 69.9 (*)    MCH 22.0 (*)    RDW 19.8 (*)    Platelets 450 (*)    All other components within normal limits  RAPID STREP SCREEN (NOT AT [facility])  CULTURE, GROUP A STREP [facility])    Imaging Review No results found. I have personally reviewed and evaluated these lab results as part of my medical decision-making.   EKG Interpretation None      MDM   Final diagnoses:  Acute nonintractable headache, unspecified headache type  URI (upper respiratory  infection)    Patient presents with headache and upper respiratory symptoms including sore throat and chills. Nontoxic on exam. Afebrile. No evidence of meningismus numbness, doubt meningitis. Headache is 5 out of 10 and have low suspicion for subarachnoid hemorrhage. Basic labwork obtained and largely reassuring.  Patient has not taken anything for the pain. Patient was given IM Toradol. Strep screen is negative. Suspect acute viral upper respiratory infection and headache. No other red flags. Discuss with patient Tylenol and Motrin at home for pain. He reported improvement of headache with Toradol.  After history, exam, and medical workup I feel the patient has been appropriately medically screened and is safe for discharge home. Pertinent diagnoses were discussed with the patient. Patient was given return precautions.  I personally performed the services described in this documentation, which was scribed in my presence. The recorded information has been reviewed and is accurate.  Merryl Hacker, MD 06/16/15 [phone]

## 2015-06-18 LAB — CULTURE, GROUP A STREP (THRC)

## 2015-06-27 ENCOUNTER — Encounter (HOSPITAL_COMMUNITY): Payer: Self-pay | Admitting: Emergency Medicine

## 2015-06-27 ENCOUNTER — Emergency Department (HOSPITAL_COMMUNITY)
Admission: EM | Admit: 2015-06-27 | Discharge: 2015-06-27 | Disposition: A | Discharge status: Home / Self Care | Attending: Emergency Medicine | Admitting: Emergency Medicine

## 2015-06-27 DIAGNOSIS — K5900 Constipation, unspecified: Secondary | ICD-10-CM | POA: Insufficient documentation

## 2015-06-27 DIAGNOSIS — Z792 Long term (current) use of antibiotics: Secondary | ICD-10-CM | POA: Insufficient documentation

## 2015-06-27 DIAGNOSIS — F17210 Nicotine dependence, cigarettes, uncomplicated: Secondary | ICD-10-CM | POA: Insufficient documentation

## 2015-06-27 DIAGNOSIS — R1033 Periumbilical pain: Secondary | ICD-10-CM | POA: Insufficient documentation

## 2015-06-27 DIAGNOSIS — Z862 Personal history of diseases of the blood and blood-forming organs and certain disorders involving the immune mechanism: Secondary | ICD-10-CM | POA: Insufficient documentation

## 2015-06-27 LAB — CBC WITH DIFFERENTIAL/PLATELET
BASOS ABS: 0 10*3/uL (ref 0.0–0.1)
BASOS PCT: 1 %
Eosinophils Absolute: 0.5 10*3/uL (ref 0.0–0.7)
Eosinophils Relative: 9 %
HEMATOCRIT: 29.2 % — AB (ref 39.0–52.0)
HEMOGLOBIN: 9.5 g/dL — AB (ref 13.0–17.0)
LYMPHS PCT: 22 %
Lymphs Abs: 1.3 10*3/uL (ref 0.7–4.0)
MCH: 22.8 pg — ABNORMAL LOW (ref 26.0–34.0)
MCHC: 32.5 g/dL (ref 30.0–36.0)
MCV: 70.2 fL — ABNORMAL LOW (ref 78.0–100.0)
Monocytes Absolute: 0.7 10*3/uL (ref 0.1–1.0)
Monocytes Relative: 12 %
NEUTROS ABS: 3.4 10*3/uL (ref 1.7–7.7)
NEUTROS PCT: 56 %
Platelets: 591 10*3/uL — ABNORMAL HIGH (ref 150–400)
RBC: 4.16 MIL/uL — AB (ref 4.22–5.81)
RDW: 19.7 % — AB (ref 11.5–15.5)
WBC: 6 10*3/uL (ref 4.0–10.5)

## 2015-06-27 LAB — BASIC METABOLIC PANEL
ANION GAP: 10 (ref 5–15)
BUN: 12 mg/dL (ref 6–20)
CALCIUM: 8.8 mg/dL — AB (ref 8.9–10.3)
CO2: 22 mmol/L (ref 22–32)
Chloride: 108 mmol/L (ref 101–111)
Creatinine, Ser: 0.99 mg/dL (ref 0.61–1.24)
Glucose, Bld: 76 mg/dL (ref 65–99)
POTASSIUM: 4 mmol/L (ref 3.5–5.1)
Sodium: 140 mmol/L (ref 135–145)

## 2015-06-27 MED ORDER — ONDANSETRON HCL 4 MG/2ML IJ SOLN
4.0000 mg | Freq: Once | INTRAMUSCULAR | Status: AC
  Administered 2015-06-27: 4 mg via INTRAVENOUS
  Filled 2015-06-27: qty 2

## 2015-06-27 MED ORDER — PREDNISONE 10 MG PO TABS: 10.0000 mg | ORAL_TABLET | Freq: Every day | ORAL | Status: DC

## 2015-06-27 MED ORDER — HYDROMORPHONE HCL 1 MG/ML IJ SOLN
1.0000 mg | Freq: Once | INTRAMUSCULAR | Status: AC
  Administered 2015-06-27: 1 mg via INTRAVENOUS
  Filled 2015-06-27: qty 1

## 2015-06-27 NOTE — ED Provider Notes (Signed)
CSN: [id number]     Arrival date & time 06/27/15  0503 History   First MD Initiated Contact with Patient 06/27/15 [phone]     Chief Complaint  Patient presents with  . Crohn's Disease    Patient is a 25 y.o. male presenting with abdominal pain. The history is provided by the patient.  Abdominal Pain Pain location:  LUQ Pain quality: cramping   Pain radiates to:  L flank Pain severity:  Moderate Onset quality:  Gradual Duration:  3 days Timing:  Intermittent Progression:  Worsening Chronicity:  Recurrent Relieved by:  Nothing Worsened by:  Movement and palpation Associated symptoms: constipation   Associated symptoms: no diarrhea, no dysuria, no fever, no hematemesis, no hematochezia and no vomiting   Risk factors comment:  H/o crohns pt reports onset of cramping LUQ/left flank pain He also reports brief associated CP but none at this time He reports symptoms are consistent with prior crohn's flare He reports constipation He denies bloody stool and denies diarrhea (told nurse he had an episode of black stool 2 days ago) He reports this usually respond to prednisone He denies previous abd surgery He reports most recent BM was nonbloody and not dark Past Medical History  Diagnosis Date  . 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 Iron deficiency anemia   . GERD (gastroesophageal reflux disease)   . ANEMIA-IRON DEFICIENCY 04/02/2009  . Protein-calorie malnutrition, severe ([place]) 04/15/2013  . GI bleed 04/22/2013    EGD by Dr Teena Irani unremarkable  . Crohn's disease ([place]) 2010    by colon:cecum and ascending colon, by EGD: duodenum, by imaging also in ileum. No villous atrophy on duodenal biopsies.   . SBO (small bowel obstruction) ([place]) 2013    in TI in area of active Crohn's.    Past Surgical History  Procedure Laterality Date  . Esophagogastroduodenoscopy  Oct 2010    Dr. Fuller Plan: duodenitis, normal villi, likely Crohn's. Elevated Gliadin but normal TTG, IgA and IgA  . Colonoscopy  Oct 2010   Dr. Fuller Plan: evidence of Crohn's at cecum, ascending colon, unable to intubate the terminal ileum. CTE with distal and terminal ileitis  . Esophagogastroduodenoscopy N/A 04/24/2013    Procedure: ESOPHAGOGASTRODUODENOSCOPY (EGD);  Surgeon: Missy Sabins, MD;  [facility] END[facility]COPY;  Service: Endoscopy;  Laterality: N/A;   Family History  Problem Relation Age of Onset  . Diabetes Maternal Aunt   . Diabetes Maternal Grandmother   . Colon cancer Neg Hx   . Crohn's disease Maternal Aunt   . Hypertension     Social History  Substance Use Topics  . Smoking status: Current Every Day Smoker -- 0.00 packs/day for 2 years    Types: Cigarettes  . Smokeless tobacco: Never Used  . Alcohol Use: Yes     Comment: occasionally    Review of Systems  Constitutional: Negative for fever.  Gastrointestinal: Positive for abdominal pain and constipation. Negative for vomiting, diarrhea, hematochezia and hematemesis.  Genitourinary: Negative for dysuria and testicular pain.  All other systems reviewed and are negative.     Allergies  Gluten meal and Wheat bran  Home Medications   Prior to Admission medications   Medication Sig Start Date End Date Taking? Authorizing Provider  ibuprofen (ADVIL,MOTRIN) 600 MG tablet Take 1 tablet (600 mg total) by mouth every 6 (six) hours as needed. Patient taking differently: Take 600 mg by mouth every 6 (six) hours as needed for moderate pain.  06/16/15  Yes Merryl Hacker, MD  penicillin v  potassium (VEETID) 500 MG tablet Take 1 tablet (500 mg total) by mouth 4 (four) times daily. 06/10/15  Yes Montine Circle, PA-C   BP 119/69 mmHg  Pulse 65  Temp(Src) 97.9 F (36.6 C) (Oral)  Resp 14  Ht 5\' 10"  (1.778 m)  Wt 79.379 kg  BMI 25.11 kg/m2  SpO2 99% Physical Exam CONSTITUTIONAL: Well developed/well nourished, pt resting comfortably on arrival to the room HEAD: Normocephalic/atraumatic EYES: EOMI/PERRL ENMT: Mucous membranes moist NECK: supple no meningeal  signs SPINE/BACK:entire spine nontender CV: S1/S2 noted, no murmurs/rubs/gallops noted LUNGS: Lungs are clear to auscultation bilaterally, no apparent distress ABDOMEN: soft, moderate LUQ tenderness, no rebound or guarding, bowel sounds noted throughout abdomen GU:no cva tenderness NEURO: Pt is awake/alert/appropriate, moves all extremitiesx4.  No facial droop.   EXTREMITIES: pulses normal/equal, full ROM SKIN: warm, color normal PSYCH: no abnormalities of mood noted, alert and oriented to situation  ED Course  Procedures  Medications  HYDROmorphone (DILAUDID) injection 1 mg (1 mg Intravenous Given 06/27/15 0646)  ondansetron (ZOFRAN) injection 4 mg (4 mg Intravenous Given 06/27/15 0645)    Labs Review Labs Reviewed  CBC WITH DIFFERENTIAL/PLATELET - Abnormal; Notable for the following:    RBC 4.16 (*)    Hemoglobin 9.5 (*)    HCT 29.2 (*)    MCV 70.2 (*)    MCH 22.8 (*)    RDW 19.7 (*)    Platelets 591 (*)    All other components within normal limits  BASIC METABOLIC PANEL     [id number] AM At signout to dr Ralene Bathe, f/u on labs If no significant derangements, and pain controlled, would d/c home and pt reports prednisone usually helps his crohns flare   MDM   Final diagnoses:  None    Nursing notes including past medical history and social history reviewed and considered in documentation Labs/vital reviewed myself and considered during evaluation     Ripley Fraise, MD 06/27/15 [phone]

## 2015-06-27 NOTE — Discharge Instructions (Signed)
Crohn Disease Crohn disease is a long-lasting (chronic) disease that affects your gastrointestinal (GI) tract. It often causes irritation and swelling (inflammation) in your small intestine and the beginning of your large intestine. However, it can affect any part of your GI tract. Crohn disease is part of a group of illnesses that are known as inflammatory bowel disease (IBD). Crohn disease may start slowly and get worse over time. Symptoms may come and go. They may also disappear for months or even years at a time (remission). CAUSES The exact cause of Crohn disease is not known. It may be a response that causes your body's defense system (immune system) to mistakenly attack healthy cells and tissues (autoimmune response). Your genes and your environment may also play a role. RISK FACTORS You may be at greater risk for Crohn disease if you:  Have other family members with Crohn disease or another IBD.  Use any tobacco products, including cigarettes, chewing tobacco, or electronic cigarettes.  Are in your 25s.  Have [place] European ancestry. SIGNS AND SYMPTOMS The main signs and symptoms of Crohn disease involve your GI tract. These include:  Diarrhea.  Rectal bleeding.  An urgent need to move your bowels.  The feeling that you are not finished having a bowel movement.  Abdominal pain or cramping.  Constipation. General signs and symptoms of Crohn disease may also include:  Unexplained weight loss.  Fatigue.  Fever.  Nausea.  Loss of appetite.  Joint pain  Changes in vision.  Red bumps on your skin. DIAGNOSIS Your health care provider may suspect Crohn disease based on your symptoms and your medical history. Your health care provider will do a physical exam. You may need to see a health care provider who specializes in diseases of the digestive tract (gastroenterologist). You may also have tests to help your health care providers make a diagnosis. These may  include:  Blood tests.  Stool sample tests.  Imaging tests, such as X-rays and CT scans.  Tests to examine the inside of your intestines using a long, flexible tube that has a light and a camera on the end (endoscopy or colonoscopy).  A procedure to take tissue samples from inside your bowel (biopsy) to be examined under a microscope. TREATMENT  There is no cure for Crohn disease. Treatment will focus on managing your symptoms. Crohn disease affects each person differently. Your treatment may include:  Resting your bowels. Drinking only clear liquids or getting nutrition through an IV for a period of time gives your bowels a chance to heal because they are not passing stools.  Medicines. These may be used alone or in combination (combination therapy). These may include antibiotic medicines. You may be given medicines that help to:  Reduce inflammation.  Control your immune system activity.  Fight infections.  Relieve cramps and prevent diarrhea.  Control your pain.  Surgery. You may need surgery if:  Medicines and other treatments are no longer working.  You develop complications from severe Crohn disease.  A section of your intestine becomes so damaged that it needs to be removed. HOME CARE INSTRUCTIONS  Take medicines only as directed by your health care provider.  If you were prescribed an antibiotic medicine, finish it all even if you start to feel better.  Keep all follow-up visits as directed by your health care provider. This is important.  Talk with your health care provider about changing your diet. This may help your symptoms. Your health care provide may recommend changes, such  as:  Drinking more fluids.  Avoiding milk and other foods that contain lactose.  Eating a low-fat diet.  Avoiding high-fiber foods, such as popcorn and nuts.  Avoiding carbonated beverages, such as soda.  Eating smaller meals more often rather than eating large  meals.  Keeping a food diary to identify foods that make your symptoms better or worse.  Do not use any tobacco products, including cigarettes, chewing tobacco, or electronic cigarettes. If you need help quitting, ask your health care provider.  Limit alcohol intake to no more than 1 drink per day for nonpregnant women and 2 drinks per day for men. One drink equals 12 ounces of beer, 5 ounces of wine, or 1 ounces of hard liquor.  Exercise daily or as directed by your health care provider. SEEK MEDICAL CARE IF:  You have diarrhea, abdominal cramps, and other gastrointestinal problems that are present almost all of the time.  Your symptoms do not improve with treatment.  You continue to lose weight.  You develop a rash or sores on your skin.  You develop eye problems.  You have a fever.   Your symptoms get worse.  You develop new symptoms. SEEK IMMEDIATE MEDICAL CARE IF:  You have bloody diarrhea.  You develop severe abdominal pain.  You cannot pass stools.   This information is not intended to replace advice given to you by your health care provider. Make sure you discuss any questions you have with your health care provider.   Document Released: 01/20/2005 Document Revised: 05/03/2014 Document Reviewed: 11/28/2013 Elsevier Interactive Patient Education 2016 Elsevier Inc.\ [place] [employer] 211 is a great source of information about community services available.  Access by dialing 2-1-1 from anywhere in [place], or by website -  CustodianSupply.fi.   Other Local Resources (Updated 04/2015)  [place]    Phone Number and Address  [place] medical care - 1st and 3rd Saturday of every month  Must not qualify for public or private insurance and must have limited income [phone] [address], [place]   Child care  Emergency assistance for housing and [employer]  Medicaid [phone] [address], [place] [address]   [facility] Department  Low-cost medical care for children, communicable diseases, sexually-transmitted diseases, immunizations, maternity care, womens health and family planning [phone] [address], [place] [address]  [facility]   Medication assistance for [facility] residents  Must meet income requirements [phone] [place], [place].    [place]  Child care  Emergency assistance for housing and [employer]  Medicaid [phone] [address] [place], [place] [address]  Community [facility] and [place]   Low-cost medical care,   Monday through Friday, 9 am to 6 pm.   Accepts Medicare/Medicaid, and self-pay [phone] [address] [place], [place] [address]  [facility] for [place] care - Monday through Friday, 8:30 am - 5:30 pm  Accepts Medicaid and self-pay [phone] [address] [place], [place] [address]   [facility]  Primary medical care, including for those with sickle cell disease  Accepts Medicare, Medicaid, insurance and self-pay [phone] [address], [place]  [facility]   Primary medical care  Accepts Medicare, [place],  insurance and self-pay [phone] [address], [place], [address] [place], [place] [address]   [facility] Department of Social Services  Child care  Emergency assistance for housing and [employer]  Medicaid [phone] [address] [place], [place] [address]  [place] Department of Health and [employer]  Child care  Emergency assistance for housing and [employer]  Medicaid  [phone] [place], [place] [address]   [employer] Medication Assistance Program  Medication assistance for [facility] residents with no insurance only  Must have a primary care doctor [phone] [address], [place], [place]  [facility]   Primary medical care  [place], [place], insurance  [phone] [address] [place], [place]  [facility]   Medication assistance [phone]  [name]   Primary medical care  Accepts Medicare, [place], insurance and self-pay [phone] [address], [place] [address]  [facility]   Primary medical care  Accepts Medicare, [place], insurance and self-pay [phone] [address], [place] [address]  [facility]  For [place] residents between the ages of 52 and 16 who do not have any form of health insurance, Medicare, [place], or [place] benefits.  Services are provided free of charge to uninsured patients who fall within federal poverty guidelines.    Hours: Tuesdays and Thursdays, 4:15 - 8 pm [phone] [address], [place], [place] [address]  [facility]     Primary medical care  Dental care  Nutritional counseling  Pharmacy  Accepts Medicaid, Medicare, most insurance.  Fees are adjusted based on ability to pay.   [place], [place] [address], [place], [facility], [place], [facility] [place], [place]  [facility]  Womens health and family planning [phone] [address] [place], [place] care  Emergency assistance for housing and [employer]  Medicaid [phone] [address] [place], [place] [address]   [facility]    Ages 41 and older  Hours: Mondays and Thursdays, 7:00 am - 9:00 am Patients are seen on a first come, first served basis. [phone], ext. [place], [place]  Child care  Emergency assistance for housing and [employer]  Medicaid [phone] [address], [place] [address]  [facility] [place]  Medication assistance  Rental assistance  Food pantry  Medication assistance  Housing assistance  Emergency food distribution  Utility assistance [place], [place]  [address] [place], [place] [address] Hours: Tuesdays and Thursdays from 9am - 12 noon by appointment only  [place], [place] [address]  [facility] [place] private insurance, [place], and [place].  Payment is based on a sliding scale for those without insurance.  Hours: Mondays, Tuesdays and Thursdays, 8:30 am - 5:30 pm.   [phone] [place], [place]  [facility] - Family Medicine at [facility], [place], and [place].  Payment is based on a sliding scale for those without insurance. [phone] [address], [place]  [facility] - Pediatrics at [address], research (medical), [employer], and [place].  Payment is based on a sliding scale for those without insurance [phone] [address], [employer], [place]  [facility] - Pediatrics at [employer], [place], and [place].  Payment is based on a sliding scale for those without insurance.  [phone] [place], [place]  [facility] - Pediatrics at [facility], [place], and [place].  Payment is based on a sliding scale for those without insurance. [phone], ext. [phone] [place], [place].    [place] care.  Accepts Medicaid and self-pay. [place], [place]

## 2015-06-27 NOTE — ED Notes (Addendum)
Pt states he's had Crohn's disease for the past 73yrs. Was at work around 0200 when he began feeling cramps and back pain consistent with Crohn's flare ups. Pt reports 1 black, tarry stool on Wed. Says he also felt dizzy, lightheaded & nauseous earlier at work, but not presently.

## 2015-06-27 NOTE — ED Provider Notes (Signed)
Patient visit shared. Patient with history of Crohn's disease here with abdominal pain. His pain is completely resolved on repeat evaluation. Abdomen soft and nontender. Patient states this pain is typical for his Crohn's flares and typically is treated with steroids. Given benign abdominal examination plan to treat empirically with steroids. Discussed close outpatient follow-up as well as return precautions for any significant worsening or change in his symptoms.  Quintella Reichert, MD 06/27/15 [phone]

## 2015-10-30 ENCOUNTER — Encounter (HOSPITAL_COMMUNITY): Payer: Self-pay | Admitting: Emergency Medicine

## 2015-10-30 ENCOUNTER — Emergency Department (HOSPITAL_COMMUNITY)
Admission: EM | Admit: 2015-10-30 | Discharge: 2015-10-31 | Disposition: A | Discharge status: Home / Self Care | Payer: Medicaid Other | Attending: Emergency Medicine | Admitting: Emergency Medicine

## 2015-10-30 ENCOUNTER — Emergency Department (HOSPITAL_COMMUNITY): Payer: Medicaid Other

## 2015-10-30 DIAGNOSIS — F17210 Nicotine dependence, cigarettes, uncomplicated: Secondary | ICD-10-CM | POA: Insufficient documentation

## 2015-10-30 DIAGNOSIS — R109 Unspecified abdominal pain: Secondary | ICD-10-CM

## 2015-10-30 DIAGNOSIS — K5010 Crohn's disease of large intestine without complications: Secondary | ICD-10-CM

## 2015-10-30 DIAGNOSIS — K5000 Crohn's disease of small intestine without complications: Secondary | ICD-10-CM | POA: Insufficient documentation

## 2015-10-30 LAB — COMPREHENSIVE METABOLIC PANEL
ALBUMIN: 3.2 g/dL — AB (ref 3.5–5.0)
ALT: 11 U/L — ABNORMAL LOW (ref 17–63)
AST: 14 U/L — AB (ref 15–41)
Alkaline Phosphatase: 48 U/L (ref 38–126)
Anion gap: 6 (ref 5–15)
BILIRUBIN TOTAL: 0.2 mg/dL — AB (ref 0.3–1.2)
BUN: 11 mg/dL (ref 6–20)
CHLORIDE: 110 mmol/L (ref 101–111)
CO2: 23 mmol/L (ref 22–32)
Calcium: 9.1 mg/dL (ref 8.9–10.3)
Creatinine, Ser: 0.99 mg/dL (ref 0.61–1.24)
GFR calc Af Amer: >60 mL/min (ref >60)
GFR calc non Af Amer: >60 mL/min (ref >60)
Glucose, Bld: 94 mg/dL (ref 65–99)
POTASSIUM: 3.3 mmol/L — AB (ref 3.5–5.1)
Sodium: 139 mmol/L (ref 135–145)
Total Protein: 6.5 g/dL (ref 6.5–8.1)

## 2015-10-30 LAB — CBC
HEMATOCRIT: 31.6 % — AB (ref 39.0–52.0)
Hemoglobin: 10.3 g/dL — ABNORMAL LOW (ref 13.0–17.0)
MCH: 24.1 pg — ABNORMAL LOW (ref 26.0–34.0)
MCHC: 32.6 g/dL (ref 30.0–36.0)
MCV: 73.8 fL — AB (ref 78.0–100.0)
Platelets: 488 10*3/uL — ABNORMAL HIGH (ref 150–400)
RBC: 4.28 MIL/uL (ref 4.22–5.81)
RDW: 21.9 % — AB (ref 11.5–15.5)
WBC: 5.4 10*3/uL (ref 4.0–10.5)

## 2015-10-30 LAB — URINALYSIS, ROUTINE W REFLEX MICROSCOPIC
GLUCOSE, UA: NEGATIVE mg/dL
Hgb urine dipstick: NEGATIVE
Ketones, ur: NEGATIVE mg/dL
LEUKOCYTES UA: NEGATIVE
Nitrite: NEGATIVE
PH: 6 (ref 5.0–8.0)
Protein, ur: NEGATIVE mg/dL
Specific Gravity, Urine: 1.02 (ref 1.005–1.030)

## 2015-10-30 LAB — LIPASE, BLOOD: LIPASE: 28 U/L (ref 11–51)

## 2015-10-30 MED ORDER — SODIUM CHLORIDE 0.9 % IV SOLN
1000.0000 mL | INTRAVENOUS | Status: DC
  Administered 2015-10-30: 1000 mL via INTRAVENOUS

## 2015-10-30 MED ORDER — SODIUM CHLORIDE 0.9 % IV SOLN
1000.0000 mL | Freq: Once | INTRAVENOUS | Status: AC
  Administered 2015-10-30: 1000 mL via INTRAVENOUS

## 2015-10-30 MED ORDER — ONDANSETRON HCL 4 MG/2ML IJ SOLN
4.0000 mg | Freq: Once | INTRAMUSCULAR | Status: AC
  Administered 2015-10-30: 4 mg via INTRAVENOUS
  Filled 2015-10-30: qty 2

## 2015-10-30 MED ORDER — HYDROMORPHONE HCL 1 MG/ML IJ SOLN
1.0000 mg | Freq: Once | INTRAMUSCULAR | Status: AC
  Administered 2015-10-30: 1 mg via INTRAVENOUS
  Filled 2015-10-30: qty 1

## 2015-10-30 NOTE — ED Notes (Signed)
Patient arrives with complaint of abdominal pain. Hx Crohn's disease. Pain onset yesterday. States abdomen hasn't felt normal for a few days. Last BM 7/4. Endorses nausea, denies vomiting. Pain LLQ --> Medial abd.

## 2015-10-30 NOTE — ED Notes (Signed)
Patient transported to CT 

## 2015-10-30 NOTE — ED Provider Notes (Signed)
CSN: [id number]     Arrival date & time 10/30/15  2100 History  By signing my name below, I, Hansel Feinstein, attest that this documentation has been prepared under the direction and in the presence of Jola Schmidt, MD. Electronically Signed: Hansel Feinstein, ED Scribe. 10/30/2015. 11:25 PM.     Chief Complaint  Patient presents with  . Abdominal Pain   The history is provided by the patient. No language interpreter was used.   HPI Comments: Shane Klein is a 25 y.o. male with h/o Crohn's disease, SBO who presents to the Emergency Department complaining of moderate, waxing and waning LLQ abdominal pain with radiation to the left lower back onset last night with associated nausea. Pt states his current symptoms are similar to prior Crohn's flare-ups. No worsening or alleviating factors noted. No h/o renal calculi. Pt is not currently followed by GI, but previously saw Dr. Fuller Plan. He denies emesis, diarrhea, dysuria, difficulty urinating, hematuria.    Past Medical History  Diagnosis Date  . 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 Iron deficiency anemia   . GERD (gastroesophageal reflux disease)   . ANEMIA-IRON DEFICIENCY 04/02/2009  . Protein-calorie malnutrition, severe ([place]) 04/15/2013  . GI bleed 04/22/2013    EGD by Dr Teena Irani unremarkable  . Crohn's disease ([place]) 2010    by colon:cecum and ascending colon, by EGD: duodenum, by imaging also in ileum. No villous atrophy on duodenal biopsies.   . SBO (small bowel obstruction) ([place]) 2013    in [place] in area of active Crohn's.    Past Surgical History  Procedure Laterality Date  . Esophagogastroduodenoscopy  Oct 2010    Dr. Fuller Plan: duodenitis, normal villi, likely Crohn's. Elevated Gliadin but normal TTG, IgA and IgA  . Colonoscopy  Oct 2010    Dr. Fuller Plan: evidence of Crohn's at cecum, ascending colon, unable to intubate the terminal ileum. CTE with distal and terminal ileitis  . Esophagogastroduodenoscopy N/A 04/24/2013    Procedure:  ESOPHAGOGASTRODUODENOSCOPY (EGD);  Surgeon: Missy Sabins, MD;  Location: [facility] ENDOSCOPY;  Service: Endoscopy;  Laterality: N/A;   Family History  Problem Relation Age of Onset  . Diabetes Maternal Aunt   . Diabetes Maternal Grandmother   . Colon cancer Neg Hx   . Crohn's disease Maternal Aunt   . Hypertension     Social History  Substance Use Topics  . Smoking status: Current Every Day Smoker -- 0.00 packs/day for 2 years    Types: Cigarettes  . Smokeless tobacco: Never Used  . Alcohol Use: Yes     Comment: occasionally    Review of Systems A complete 10 system review of systems was obtained and all systems are negative except as noted in the HPI and PMH.    Allergies  Gluten meal and Wheat bran  Home Medications   Prior to Admission medications   Medication Sig Start Date End Date Taking? Authorizing Provider  ibuprofen (ADVIL,MOTRIN) 600 MG tablet Take 1 tablet (600 mg total) by mouth every 6 (six) hours as needed. Patient taking differently: Take 600 mg by mouth every 6 (six) hours as needed for moderate pain.  06/16/15   Merryl Hacker, MD  penicillin v potassium (VEETID) 500 MG tablet Take 1 tablet (500 mg total) by mouth 4 (four) times daily. 06/10/15   Montine Circle, PA-C  predniSONE (DELTASONE) 10 MG tablet Take 1 tablet (10 mg total) by mouth daily. Take four tablets by mouth daily for four days followed by three tablets by mouth  daily for three days followed by two tablets by mouth daily for two days followed by one tablet by mouth on the last day. 06/27/15   Quintella Reichert, MD   BP 136/86 mmHg  Pulse 75  Temp(Src) 98.8 F (37.1 C) (Oral)  Resp 18  Ht 5' 10"  (1.778 m)  Wt 175 lb (79.379 kg)  BMI 25.11 kg/m2  SpO2 100% Physical Exam  Constitutional: He is oriented to person, place, and time. He appears well-developed and well-nourished.  HENT:  Head: Normocephalic and atraumatic.  Eyes: EOM are normal.  Neck: Normal range of motion.  Cardiovascular: Normal  rate, regular rhythm, normal heart sounds and intact distal pulses.   Pulmonary/Chest: Effort normal and breath sounds normal. No respiratory distress.  Abdominal: Soft. He exhibits no distension. There is tenderness. There is no rebound and no guarding.  Mild left sided abdominal tenderness without guarding or rebound.   Musculoskeletal: Normal range of motion.  Neurological: He is alert and oriented to person, place, and time.  Skin: Skin is warm and dry.  Psychiatric: He has a normal mood and affect. Judgment normal.  Nursing note and vitals reviewed.   ED Course  Procedures (including critical care time) DIAGNOSTIC STUDIES: Oxygen Saturation is 100% on RA, normal by my interpretation.    COORDINATION OF CARE: 11:23 PM Discussed treatment plan with pt at bedside which includes lab work, UA, CT A/P and pt agreed to plan.   Labs Review Labs Reviewed  COMPREHENSIVE METABOLIC PANEL - Abnormal; Notable for the following:    Potassium 3.3 (*)    Albumin 3.2 (*)    AST 14 (*)    ALT 11 (*)    Total Bilirubin 0.2 (*)    All other components within normal limits  CBC - Abnormal; Notable for the following:    Hemoglobin 10.3 (*)    HCT 31.6 (*)    MCV 73.8 (*)    MCH 24.1 (*)    RDW 21.9 (*)    Platelets 488 (*)    All other components within normal limits  URINALYSIS, ROUTINE W REFLEX MICROSCOPIC (NOT AT [facility]) - Abnormal; Notable for the following:    Bilirubin Urine SMALL (*)    All other components within normal limits  LIPASE, BLOOD    Imaging Review Ct Renal Stone Study  10/31/2015  CLINICAL DATA:  Initial evaluation for acute left flank pain. History of Crohn's disease. EXAM: CT ABDOMEN AND PELVIS WITHOUT CONTRAST TECHNIQUE: Multidetector CT imaging of the abdomen and pelvis was performed following the standard protocol without IV contrast. COMPARISON:  Prior CT from 1 05/15/2014. FINDINGS: Visualized lung bases are clear. Limited noncontrast evaluation of the liver is  unremarkable. Gallbladder within normal limits. No biliary dilatation. Spleen, adrenal glands, and pancreas are within normal limits. Kidneys equal in size without evidence of nephrolithiasis or hydronephrosis. No radiopaque calculi seen along the course of either renal collecting system. There is no hydroureter. Stomach within normal limits. There is an abnormal loop of fluid-filled small bowel within the mid/lower abdomen that demonstrate circumferential wall thickening and mild perienteric inflammatory stranding. Findings suggestive of acute enteritis, suspected to be related to history of underlying Crohn's disease. This likely reflects the ileum, although evaluation somewhat limited on this noncontrast examination. Scattered hyperdensity within the lumen of the small bowel likely reflects retained enteric contrast material and/or ingested material. No definite abscess on this noncontrast examination. Intraluminal fluid density noted within the distal colon/rectum is well. Bladder largely decompressed. Mild circumferential  bladder wall thickening felt to be related incomplete distension. Prostate normal. No free intraperitoneal air. No significant free fluid. Multiple prominent mesenteric lymph nodes clustered within the lower abdomen measure up to 17 mm, likely reactive. No other pathologically enlarged intra-abdominal or pelvic lymph nodes. No acute osseous abnormality. No worrisome lytic or blastic osseous lesions. IMPRESSION: 1. Abnormal long segment loop of ileum within the lower mid abdomen/pelvis demonstrating circumferential wall thickening with associated mesenteric inflammatory stranding. Findings suggestive of acute enteritis, suspected to be related to active Crohn's disease/flare. 2. Scattered enlarged mesenteric lymph nodes within the lower abdomen, similar to prior, and suspected to be reactive in nature. 3. No CT evidence for nephrolithiasis or obstructive uropathy. Electronically Signed   By:  Jeannine Boga M.D.   On: 10/31/2015 00:37   I have personally reviewed and evaluated these images and lab results as part of my medical decision-making.    MDM   Final diagnoses:  None    Patient feels much better after treatment in the emergency department with fluids and antinausea medications and pain medications.  This likely a flare of his Crohn's disease.  Patient be discharged home with thecourse of steroids he understands to return to the ER as needed for new or worsening symptoms  I personally performed the services described in this documentation, which was scribed in my presence. The recorded information has been reviewed and is accurate.       Jola Schmidt, MD 10/31/15 [phone]

## 2015-10-31 MED ORDER — PREDNISONE 10 MG PO TABS: 60.0000 mg | ORAL_TABLET | Freq: Every day | ORAL | Status: DC

## 2015-10-31 MED ORDER — HYDROCODONE-ACETAMINOPHEN 5-325 MG PO TABS: 1.0000 | ORAL_TABLET | Freq: Four times a day (QID) | ORAL | Status: DC | PRN

## 2015-10-31 MED ORDER — ONDANSETRON 8 MG PO TBDP: 8.0000 mg | ORAL_TABLET | Freq: Three times a day (TID) | ORAL | Status: DC | PRN

## 2015-10-31 MED ORDER — PREDNISONE 20 MG PO TABS
60.0000 mg | ORAL_TABLET | Freq: Once | ORAL | Status: AC
  Administered 2015-10-31: 60 mg via ORAL
  Filled 2015-10-31: qty 3

## 2015-10-31 NOTE — Discharge Instructions (Signed)
Crohn Disease Crohn disease is a long-lasting (chronic) disease that affects your gastrointestinal (GI) tract. It often causes irritation and swelling (inflammation) in your small intestine and the beginning of your large intestine. However, it can affect any part of your GI tract. Crohn disease is part of a group of illnesses that are known as inflammatory bowel disease (IBD). Crohn disease may start slowly and get worse over time. Symptoms may come and go. They may also disappear for months or even years at a time (remission). CAUSES The exact cause of Crohn disease is not known. It may be a response that causes your body's defense system (immune system) to mistakenly attack healthy cells and tissues (autoimmune response). Your genes and your environment may also play a role. RISK FACTORS You may be at greater risk for Crohn disease if you:  Have other family members with Crohn disease or another IBD.  Use any tobacco products, including cigarettes, chewing tobacco, or electronic cigarettes.  Are in your 65s.  Have [place] European ancestry. SIGNS AND SYMPTOMS The main signs and symptoms of Crohn disease involve your GI tract. These include:  Diarrhea.  Rectal bleeding.  An urgent need to move your bowels.  The feeling that you are not finished having a bowel movement.  Abdominal pain or cramping.  Constipation. General signs and symptoms of Crohn disease may also include:  Unexplained weight loss.  Fatigue.  Fever.  Nausea.  Loss of appetite.  Joint pain  Changes in vision.  Red bumps on your skin. DIAGNOSIS Your health care provider may suspect Crohn disease based on your symptoms and your medical history. Your health care provider will do a physical exam. You may need to see a health care provider who specializes in diseases of the digestive tract (gastroenterologist). You may also have tests to help your health care providers make a diagnosis. These may  include:  Blood tests.  Stool sample tests.  Imaging tests, such as X-rays and CT scans.  Tests to examine the inside of your intestines using a long, flexible tube that has a light and a camera on the end (endoscopy or colonoscopy).  A procedure to take tissue samples from inside your bowel (biopsy) to be examined under a microscope. TREATMENT  There is no cure for Crohn disease. Treatment will focus on managing your symptoms. Crohn disease affects each person differently. Your treatment may include:  Resting your bowels. Drinking only clear liquids or getting nutrition through an IV for a period of time gives your bowels a chance to heal because they are not passing stools.  Medicines. These may be used alone or in combination (combination therapy). These may include antibiotic medicines. You may be given medicines that help to:  Reduce inflammation.  Control your immune system activity.  Fight infections.  Relieve cramps and prevent diarrhea.  Control your pain.  Surgery. You may need surgery if:  Medicines and other treatments are no longer working.  You develop complications from severe Crohn disease.  A section of your intestine becomes so damaged that it needs to be removed. HOME CARE INSTRUCTIONS  Take medicines only as directed by your health care provider.  If you were prescribed an antibiotic medicine, finish it all even if you start to feel better.  Keep all follow-up visits as directed by your health care provider. This is important.  Talk with your health care provider about changing your diet. This may help your symptoms. Your health care provide may recommend changes, such  as:  Drinking more fluids.  Avoiding milk and other foods that contain lactose.  Eating a low-fat diet.  Avoiding high-fiber foods, such as popcorn and nuts.  Avoiding carbonated beverages, such as soda.  Eating smaller meals more often rather than eating large  meals.  Keeping a food diary to identify foods that make your symptoms better or worse.  Do not use any tobacco products, including cigarettes, chewing tobacco, or electronic cigarettes. If you need help quitting, ask your health care provider.  Limit alcohol intake to no more than 1 drink per day for nonpregnant women and 2 drinks per day for men. One drink equals 12 ounces of beer, 5 ounces of wine, or 1 ounces of hard liquor.  Exercise daily or as directed by your health care provider. SEEK MEDICAL CARE IF:  You have diarrhea, abdominal cramps, and other gastrointestinal problems that are present almost all of the time.  Your symptoms do not improve with treatment.  You continue to lose weight.  You develop a rash or sores on your skin.  You develop eye problems.  You have a fever.   Your symptoms get worse.  You develop new symptoms. SEEK IMMEDIATE MEDICAL CARE IF:  You have bloody diarrhea.  You develop severe abdominal pain.  You cannot pass stools.   This information is not intended to replace advice given to you by your health care provider. Make sure you discuss any questions you have with your health care provider.   Document Released: 01/20/2005 Document Revised: 05/03/2014 Document Reviewed: 11/28/2013 Elsevier Interactive Patient Education [employer].

## 2015-11-16 ENCOUNTER — Emergency Department (HOSPITAL_COMMUNITY)
Admission: EM | Admit: 2015-11-16 | Discharge: 2015-11-17 | Disposition: A | Discharge status: Home / Self Care | Attending: Emergency Medicine | Admitting: Emergency Medicine

## 2015-11-16 ENCOUNTER — Encounter (HOSPITAL_COMMUNITY): Payer: Self-pay | Admitting: Emergency Medicine

## 2015-11-16 DIAGNOSIS — Z8719 Personal history of other diseases of the digestive system: Secondary | ICD-10-CM

## 2015-11-16 DIAGNOSIS — F17210 Nicotine dependence, cigarettes, uncomplicated: Secondary | ICD-10-CM | POA: Insufficient documentation

## 2015-11-16 DIAGNOSIS — R1084 Generalized abdominal pain: Secondary | ICD-10-CM

## 2015-11-16 LAB — CBC
HEMATOCRIT: 34.1 % — AB (ref 39.0–52.0)
HEMOGLOBIN: 11 g/dL — AB (ref 13.0–17.0)
MCH: 24.1 pg — AB (ref 26.0–34.0)
MCHC: 32.3 g/dL (ref 30.0–36.0)
MCV: 74.6 fL — AB (ref 78.0–100.0)
Platelets: 405 10*3/uL — ABNORMAL HIGH (ref 150–400)
RBC: 4.57 MIL/uL (ref 4.22–5.81)
RDW: 21.1 % — ABNORMAL HIGH (ref 11.5–15.5)
WBC: 7.9 10*3/uL (ref 4.0–10.5)

## 2015-11-16 LAB — COMPREHENSIVE METABOLIC PANEL
ALT: 9 U/L — ABNORMAL LOW (ref 17–63)
ANION GAP: 12 (ref 5–15)
AST: 12 U/L — ABNORMAL LOW (ref 15–41)
Albumin: 3.3 g/dL — ABNORMAL LOW (ref 3.5–5.0)
Alkaline Phosphatase: 51 U/L (ref 38–126)
BUN: 11 mg/dL (ref 6–20)
CHLORIDE: 103 mmol/L (ref 101–111)
CO2: 25 mmol/L (ref 22–32)
Calcium: 8.7 mg/dL — ABNORMAL LOW (ref 8.9–10.3)
Creatinine, Ser: 0.93 mg/dL (ref 0.61–1.24)
GFR calc non Af Amer: >60 mL/min (ref >60)
Glucose, Bld: 71 mg/dL (ref 65–99)
POTASSIUM: 3.2 mmol/L — AB (ref 3.5–5.1)
SODIUM: 140 mmol/L (ref 135–145)
Total Bilirubin: 0.4 mg/dL (ref 0.3–1.2)
Total Protein: 6.8 g/dL (ref 6.5–8.1)

## 2015-11-16 LAB — URINALYSIS, ROUTINE W REFLEX MICROSCOPIC
BILIRUBIN URINE: NEGATIVE
GLUCOSE, UA: NEGATIVE mg/dL
Hgb urine dipstick: NEGATIVE
KETONES UR: NEGATIVE mg/dL
Leukocytes, UA: NEGATIVE
Nitrite: NEGATIVE
PH: 6 (ref 5.0–8.0)
Protein, ur: NEGATIVE mg/dL
SPECIFIC GRAVITY, URINE: 1.021 (ref 1.005–1.030)

## 2015-11-16 LAB — LIPASE, BLOOD: LIPASE: 20 U/L (ref 11–51)

## 2015-11-16 MED ORDER — METHYLPREDNISOLONE SODIUM SUCC 125 MG IJ SOLR
125.0000 mg | Freq: Once | INTRAMUSCULAR | Status: AC
  Administered 2015-11-16: 125 mg via INTRAVENOUS
  Filled 2015-11-16: qty 2

## 2015-11-16 MED ORDER — HYDROMORPHONE HCL 1 MG/ML IJ SOLN
0.5000 mg | Freq: Once | INTRAMUSCULAR | Status: AC
  Administered 2015-11-16: 0.5 mg via INTRAVENOUS
  Filled 2015-11-16: qty 1

## 2015-11-16 MED ORDER — SODIUM CHLORIDE 0.9 % IV BOLUS (SEPSIS)
1000.0000 mL | Freq: Once | INTRAVENOUS | Status: AC
  Administered 2015-11-16: 1000 mL via INTRAVENOUS

## 2015-11-16 MED ORDER — ONDANSETRON HCL 4 MG/2ML IJ SOLN
4.0000 mg | Freq: Once | INTRAMUSCULAR | Status: AC
  Administered 2015-11-16: 4 mg via INTRAVENOUS
  Filled 2015-11-16: qty 2

## 2015-11-16 MED ORDER — SULFASALAZINE 500 MG PO TBEC
500.0000 mg | DELAYED_RELEASE_TABLET | Freq: Once | ORAL | Status: AC
  Administered 2015-11-17: 500 mg via ORAL
  Filled 2015-11-16: qty 1

## 2015-11-16 MED ORDER — HYDROMORPHONE HCL 1 MG/ML IJ SOLN
1.0000 mg | Freq: Once | INTRAMUSCULAR | Status: AC
  Administered 2015-11-17: 1 mg via INTRAVENOUS
  Filled 2015-11-16: qty 1

## 2015-11-16 NOTE — ED Triage Notes (Signed)
Pt has hx of crohn's disease for approx. 6 years. Pt states s/s started approx., 2 days ago with LLQ pain but tightness is felt throughout the whole abd. Pt is experiencing diarrhea, bloating, loss of appitatie. Active bowel sounds x4 quads. No blood in stool. No N/V.

## 2015-11-17 MED ORDER — SULFASALAZINE 500 MG PO TABS: 500.0000 mg | ORAL_TABLET | Freq: Four times a day (QID) | ORAL | 0 refills | Status: DC

## 2015-11-17 MED ORDER — ONDANSETRON HCL 4 MG PO TABS
4.0000 mg | ORAL_TABLET | Freq: Four times a day (QID) | ORAL | 0 refills | Status: DC
Start: 2015-11-17 — End: 2016-01-10

## 2015-11-17 MED ORDER — PREDNISONE 10 MG PO TABS: ORAL_TABLET | ORAL | 0 refills | Status: DC

## 2015-11-17 MED ORDER — HYDROCODONE-ACETAMINOPHEN 5-325 MG PO TABS
1.0000 | ORAL_TABLET | ORAL | 0 refills | Status: DC | PRN
Start: 2015-11-17 — End: 2016-01-10

## 2015-11-17 NOTE — ED Provider Notes (Signed)
[facility] DEPT Provider Note   CSN: [id number] Arrival date & time: 11/16/15  [id number]  First Provider Contact:  First MD Initiated Contact with Patient 11/16/15 2237        History   Chief Complaint Chief Complaint  Patient presents with  . Abdominal Pain    HPI Shane Klein is a 25 y.o. male.  Patient with a history of Crohn's Disease presents with LLQ abdominal pain x 2 days. He reports x2/day diarrhea that is non-bloody. He has had nausea without vomiting. No fever, SOB, urinary symptoms. Current symptoms feel like previous episodes of Crohn's flare up.    The history is provided by the patient and a friend. No language interpreter was used.  Abdominal Pain   This is a recurrent problem. The problem has been gradually worsening. The pain is located in the LLQ. The pain is moderate. Associated symptoms include diarrhea and nausea. Pertinent negatives include fever, hematochezia, vomiting, dysuria and myalgias. Nothing aggravates the symptoms. Nothing relieves the symptoms. His past medical history is significant for Crohn's disease.    Past Medical History:  Diagnosis Date  . ANEMIA-IRON DEFICIENCY 04/02/2009  . Crohn's disease ([place]) 2010   by colon:cecum and ascending colon, by EGD: duodenum, by imaging also in ileum. No villous atrophy on duodenal biopsies.   Marland Kitchen GERD (gastroesophageal reflux disease)   . GI bleed 04/22/2013   EGD by Dr Teena Irani unremarkable  . Iron deficiency anemia   . Protein-calorie malnutrition, severe ([place]) 04/15/2013  . SBO (small bowel obstruction) ([place]) 2013   in [place] in area of active Crohn's.   . 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[facility])     Patient Active Problem List   Diagnosis Date Noted  . Abdominal pain 08/04/2014  . Thrombocytosis ([place]) 08/04/2014  . Microcytic anemia 08/04/2014  . Abdominal pain, left lower quadrant   . Crohn's colitis ([place]) 07/31/2013  . Partial small bowel obstruction ([place]) 07/31/2013  . Diarrhea 07/31/2013  . Tobacco use  07/31/2013  . Crohn's ileitis ([place]) 07/31/2013  . Pedal edema 04/24/2013  . GI bleed 04/22/2013  . Edema extremities 04/22/2013  . Protein-calorie malnutrition, severe ([place]) 04/15/2013  . Small bowel obstruction ([place]) 04/14/2013  . GERD (gastroesophageal reflux disease) 11/20/2012  . Crohn's disease of ileum ([place]) 10/05/2012  . Crohn's disease with intestinal obstruction ([place]) 03/01/2012  . Anemia 03/01/2012  . [place] INTESTINE 05/16/2009  . Iron deficiency anemia 04/02/2009  . DUODENITIS 04/02/2009  . ABDOMINAL PAIN OTHER SPECIFIED SITE 04/02/2009  . WEIGHT LOSS-ABNORMAL 01/27/2009    Past Surgical History:  Procedure Laterality Date  . COLONOSCOPY  Oct 2010   Dr. Fuller Plan: evidence of Crohn's at cecum, ascending colon, unable to intubate the terminal ileum. CTE with distal and terminal ileitis  . ESOPHAGOGASTRODUODENOSCOPY  Oct 2010   Dr. Fuller Plan: duodenitis, normal villi, likely Crohn's. Elevated Gliadin but normal TTG, IgA and IgA  . ESOPHAGOGASTRODUODENOSCOPY N/A 04/24/2013   Procedure: ESOPHAGOGASTRODUODENOSCOPY (EGD);  Surgeon: Missy Sabins, MD;  Location: [facility] ENDOSCOPY;  Service: Endoscopy;  Laterality: N/A;       Home Medications    Prior to Admission medications   Medication Sig Start Date End Date Taking? Authorizing Provider  HYDROcodone-acetaminophen (NORCO/VICODIN) 5-325 MG tablet Take 1 tablet by mouth every 6 (six) hours as needed for moderate pain. 10/31/15  Yes Jola Schmidt, MD  ibuprofen (ADVIL,MOTRIN) 600 MG tablet Take 1 tablet (600 mg total) by mouth every 6 (six) hours as needed. Patient taking differently: Take 600 mg  by mouth every 6 (six) hours as needed for moderate pain.  06/16/15  Yes Merryl Hacker, MD  ondansetron (ZOFRAN ODT) 8 MG disintegrating tablet Take 1 tablet (8 mg total) by mouth every 8 (eight) hours as needed for nausea or vomiting. Patient not taking: Reported on 11/16/2015 10/31/15   Jola Schmidt, MD    Family  History Family History  Problem Relation Age of Onset  . Diabetes Maternal Aunt   . Diabetes Maternal Grandmother   . Crohn's disease Maternal Aunt   . Hypertension    . Colon cancer Neg Hx     Social History Social History  Substance Use Topics  . Smoking status: Current Every Day Smoker    Packs/day: 0.25    Years: 15.00    Types: Cigarettes  . Smokeless tobacco: Never Used  . Alcohol use No     Comment: occasionally     Allergies   Gluten meal and Wheat bran   Review of Systems Review of Systems  Constitutional: Positive for appetite change. Negative for chills, fever and unexpected weight change.  Respiratory: Negative.  Negative for shortness of breath.   Cardiovascular: Negative.  Negative for chest pain.  Gastrointestinal: Positive for abdominal pain, diarrhea and nausea. Negative for blood in stool, hematochezia and vomiting.  Genitourinary: Negative.  Negative for dysuria.  Musculoskeletal: Negative.  Negative for myalgias.  Skin: Negative.   Neurological: Negative.      Physical Exam Updated Vital Signs BP 129/78   Pulse 66   Temp 97.9 F (36.6 C) (Oral)   Resp 18   SpO2 98%   Physical Exam  Constitutional: He is oriented to person, place, and time. He appears well-developed and well-nourished.  Neck: Normal range of motion.  Pulmonary/Chest: Effort normal. No respiratory distress.  Abdominal: Soft. He exhibits no distension and no mass. There is tenderness (Diffuse abdominal tenderness, greatest in LLQ.). There is no guarding.  Musculoskeletal: Normal range of motion.  Neurological: He is alert and oriented to person, place, and time.  Skin: Skin is warm and dry.  Psychiatric: He has a normal mood and affect.     ED Treatments / Results  Labs (all labs ordered are listed, but only abnormal results are displayed) Labs Reviewed  COMPREHENSIVE METABOLIC PANEL - Abnormal; Notable for the following:       Result Value   Potassium 3.2 (*)     Calcium 8.7 (*)    Albumin 3.3 (*)    AST 12 (*)    ALT 9 (*)    All other components within normal limits  CBC - Abnormal; Notable for the following:    Hemoglobin 11.0 (*)    HCT 34.1 (*)    MCV 74.6 (*)    MCH 24.1 (*)    RDW 21.1 (*)    Platelets 405 (*)    All other components within normal limits  LIPASE, BLOOD  URINALYSIS, ROUTINE W REFLEX MICROSCOPIC (NOT AT [facility])    EKG  EKG Interpretation None       Radiology No results found.  Procedures Procedures (including critical care time)  Medications Ordered in ED Medications  ondansetron (ZOFRAN) injection 4 mg (4 mg Intravenous Given 11/16/15 2305)  methylPREDNISolone sodium succinate (SOLU-MEDROL) 125 mg/2 mL injection 125 mg (125 mg Intravenous Given 11/16/15 2305)  sodium chloride 0.9 % bolus 1,000 mL (0 mLs Intravenous Stopped 11/17/15 0047)  HYDROmorphone (DILAUDID) injection 0.5 mg (0.5 mg Intravenous Given 11/16/15 2305)  sulfaSALAzine (AZULFIDINE) EC tablet  500 mg (500 mg Oral Given 11/17/15 0046)  HYDROmorphone (DILAUDID) injection 1 mg (1 mg Intravenous Given 11/17/15 0046)     Initial Impression / Assessment and Plan / ED Course  I have reviewed the triage vital signs and the nursing notes.  Pertinent labs & imaging results that were available during my care of the patient were reviewed by me and considered in my medical decision making (see chart for details).  Clinical Course    Patient with a history of Crohn's presents with abdominal pain familiar to him as previous Crohn's flare. No fever. Labs are reassuring. His pain is improved with medications, nausea resolved. He is felt stable for discharge home. Discussed outpatient follow up for routine management of symptoms.   Final Clinical Impressions(s) / ED Diagnoses   Final diagnoses:  None  1. Abdominal pain 2. History of Crohn's Disease  New Prescriptions New Prescriptions   No medications on file     Charlann Lange, Hershal Coria 11/17/15 0117     Lajean Saver, MD 12/24/15 [phone]

## 2016-01-09 ENCOUNTER — Encounter (HOSPITAL_COMMUNITY): Payer: Self-pay

## 2016-01-09 ENCOUNTER — Emergency Department (HOSPITAL_COMMUNITY)
Admission: EM | Admit: 2016-01-09 | Discharge: 2016-01-10 | Disposition: A | Discharge status: Home / Self Care | Attending: Emergency Medicine | Admitting: Emergency Medicine

## 2016-01-09 DIAGNOSIS — F17210 Nicotine dependence, cigarettes, uncomplicated: Secondary | ICD-10-CM | POA: Insufficient documentation

## 2016-01-09 DIAGNOSIS — K5090 Crohn's disease, unspecified, without complications: Secondary | ICD-10-CM | POA: Insufficient documentation

## 2016-01-09 LAB — COMPREHENSIVE METABOLIC PANEL
ALT: 8 U/L — ABNORMAL LOW (ref 17–63)
ANION GAP: 6 (ref 5–15)
AST: 12 U/L — ABNORMAL LOW (ref 15–41)
Albumin: 3.2 g/dL — ABNORMAL LOW (ref 3.5–5.0)
Alkaline Phosphatase: 43 U/L (ref 38–126)
BILIRUBIN TOTAL: 0.3 mg/dL (ref 0.3–1.2)
BUN: 9 mg/dL (ref 6–20)
CO2: 20 mmol/L — ABNORMAL LOW (ref 22–32)
Calcium: 8.8 mg/dL — ABNORMAL LOW (ref 8.9–10.3)
Chloride: 113 mmol/L — ABNORMAL HIGH (ref 101–111)
Creatinine, Ser: 0.97 mg/dL (ref 0.61–1.24)
Glucose, Bld: 98 mg/dL (ref 65–99)
POTASSIUM: 3.5 mmol/L (ref 3.5–5.1)
Sodium: 139 mmol/L (ref 135–145)
TOTAL PROTEIN: 6.3 g/dL — AB (ref 6.5–8.1)

## 2016-01-09 LAB — CBC
HEMATOCRIT: 31 % — AB (ref 39.0–52.0)
HEMOGLOBIN: 9.8 g/dL — AB (ref 13.0–17.0)
MCH: 24.6 pg — ABNORMAL LOW (ref 26.0–34.0)
MCHC: 31.6 g/dL (ref 30.0–36.0)
MCV: 77.7 fL — ABNORMAL LOW (ref 78.0–100.0)
Platelets: 400 10*3/uL (ref 150–400)
RBC: 3.99 MIL/uL — ABNORMAL LOW (ref 4.22–5.81)
RDW: 17.5 % — ABNORMAL HIGH (ref 11.5–15.5)
WBC: 5.2 10*3/uL (ref 4.0–10.5)

## 2016-01-09 LAB — URINALYSIS, ROUTINE W REFLEX MICROSCOPIC
GLUCOSE, UA: NEGATIVE mg/dL
HGB URINE DIPSTICK: NEGATIVE
Ketones, ur: NEGATIVE mg/dL
LEUKOCYTES UA: NEGATIVE
NITRITE: NEGATIVE
PH: 5.5 (ref 5.0–8.0)
Protein, ur: NEGATIVE mg/dL
Specific Gravity, Urine: 1.021 (ref 1.005–1.030)

## 2016-01-09 LAB — LIPASE, BLOOD: Lipase: 38 U/L (ref 11–51)

## 2016-01-09 NOTE — ED Triage Notes (Signed)
Pt has Crohns disease and is here for abd pain and constipation. Pt reports pain X2 days. Pt reports some nausea but denies vomiting and diarrhea.

## 2016-01-09 NOTE — ED Notes (Signed)
Pt called x2 for vitals and no answer

## 2016-01-10 MED ORDER — ONDANSETRON 4 MG PO TBDP
4.0000 mg | ORAL_TABLET | Freq: Once | ORAL | Status: AC
  Administered 2016-01-10: 4 mg via ORAL
  Filled 2016-01-10: qty 1

## 2016-01-10 MED ORDER — HYDROCODONE-ACETAMINOPHEN 5-325 MG PO TABS: 1.0000 | ORAL_TABLET | Freq: Four times a day (QID) | ORAL | 0 refills | Status: DC | PRN

## 2016-01-10 MED ORDER — DEXAMETHASONE SODIUM PHOSPHATE 10 MG/ML IJ SOLN
10.0000 mg | Freq: Once | INTRAMUSCULAR | Status: AC
  Administered 2016-01-10: 10 mg via INTRAMUSCULAR
  Filled 2016-01-10: qty 1

## 2016-01-10 MED ORDER — HYDROCODONE-ACETAMINOPHEN 5-325 MG PO TABS
1.0000 | ORAL_TABLET | Freq: Once | ORAL | Status: AC
  Administered 2016-01-10: 1 via ORAL
  Filled 2016-01-10: qty 1

## 2016-01-10 MED ORDER — PREDNISONE 10 MG PO TABS: 60.0000 mg | ORAL_TABLET | Freq: Every day | ORAL | 0 refills | Status: DC

## 2016-01-10 NOTE — ED Provider Notes (Signed)
[facility] DEPT Provider Note   CSN: [id number] Arrival date & time: 01/09/16  2032  By signing my name below, I, Irene Pap, attest that this documentation has been prepared under the direction and in the presence of Varney Biles, MD. Electronically Signed: Irene Pap, ED Scribe. 01/10/16. 12:55 AM.  History   Chief Complaint Chief Complaint  Patient presents with  . Abdominal Pain   The history is provided by the patient. No language interpreter was used.   HPI Comments: Shane Klein is a 25 y.o. male with a hx of Crohn's disease, GERD, GI bleed, SBO, protein-calorie malnutrition, and silicatosis who presents to the Emergency Department complaining of gradually worsening generalized abdominal pain, worse on the left, onset 2 days ago. Pt reports associated loose diarrhea x1 a day, nausea, and decreased appetite. Pt reports worsening pain with eating. No fevers, or chills. Pt states that the pain feels the same as past Crohn's flare ups, but he typically has abdominal distention. He does not take medications daily for his Crohn's disease, but takes prednisone with flare ups. He has episodes about every 3 months. Pt is trying to get started with a GI doctor. He has had Crohn's for about 7 years. He denies hx of abdominal surgeries, fever, hematuria, or dysuria.   Past Medical History:  Diagnosis Date  . ANEMIA-IRON DEFICIENCY 04/02/2009  . Crohn's disease ([place]) 2010   by colon:cecum and ascending colon, by EGD: duodenum, by imaging also in ileum. No villous atrophy on duodenal biopsies.   Marland Kitchen GERD (gastroesophageal reflux disease)   . GI bleed 04/22/2013   EGD by Dr Teena Irani unremarkable  . Iron deficiency anemia   . Protein-calorie malnutrition, severe ([place]) 04/15/2013  . SBO (small bowel obstruction) ([place]) 2013   in [place] in area of active Crohn's.   . 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[facility])     Patient Active Problem List   Diagnosis Date Noted  . Abdominal pain 08/04/2014  .  Thrombocytosis ([place]) 08/04/2014  . Microcytic anemia 08/04/2014  . Abdominal pain, left lower quadrant   . Crohn's colitis ([place]) 07/31/2013  . Partial small bowel obstruction ([place]) 07/31/2013  . Diarrhea 07/31/2013  . Tobacco use 07/31/2013  . Crohn's ileitis ([place]) 07/31/2013  . Pedal edema 04/24/2013  . GI bleed 04/22/2013  . Edema extremities 04/22/2013  . Protein-calorie malnutrition, severe ([place]) 04/15/2013  . Small bowel obstruction ([place]) 04/14/2013  . GERD (gastroesophageal reflux disease) 11/20/2012  . Crohn's disease of ileum ([place]) 10/05/2012  . Crohn's disease with intestinal obstruction ([place]) 03/01/2012  . Anemia 03/01/2012  . [place] INTESTINE 05/16/2009  . Iron deficiency anemia 04/02/2009  . DUODENITIS 04/02/2009  . ABDOMINAL PAIN OTHER SPECIFIED SITE 04/02/2009  . WEIGHT LOSS-ABNORMAL 01/27/2009    Past Surgical History:  Procedure Laterality Date  . COLONOSCOPY  Oct 2010   Dr. Fuller Plan: evidence of Crohn's at cecum, ascending colon, unable to intubate the terminal ileum. CTE with distal and terminal ileitis  . ESOPHAGOGASTRODUODENOSCOPY  Oct 2010   Dr. Fuller Plan: duodenitis, normal villi, likely Crohn's. Elevated Gliadin but normal TTG, IgA and IgA  . ESOPHAGOGASTRODUODENOSCOPY N/A 04/24/2013   Procedure: ESOPHAGOGASTRODUODENOSCOPY (EGD);  Surgeon: Missy Sabins, MD;  [facility] END[facility];  Service: Endoscopy;  Laterality: N/A;       Home Medications    Prior to Admission medications   Medication Sig Start Date End Date Taking? Authorizing Provider  ibuprofen (ADVIL,MOTRIN) 600 MG tablet Take 1 tablet (600 mg total) by mouth every 6 (  six) hours as needed. Patient taking differently: Take 800 mg by mouth every 6 (six) hours as needed for moderate pain.  06/16/15  Yes Merryl Hacker, MD  HYDROcodone-acetaminophen (NORCO/VICODIN) 5-325 MG tablet Take 1 tablet by mouth every 6 (six) hours as needed. 01/10/16   Varney Biles, MD  predniSONE  (DELTASONE) 10 MG tablet Take 6 tablets (60 mg total) by mouth daily. 01/10/16   Varney Biles, MD    Family History Family History  Problem Relation Age of Onset  . Diabetes Maternal Aunt   . Diabetes Maternal Grandmother   . Crohn's disease Maternal Aunt   . Hypertension    . Colon cancer Neg Hx     Social History Social History  Substance Use Topics  . Smoking status: Current Every Day Smoker    Packs/day: 0.25    Years: 15.00    Types: Cigarettes  . Smokeless tobacco: Never Used  . Alcohol use No     Comment: occasionally     Allergies   Gluten meal and Wheat bran   Review of Systems Review of Systems 10 Systems reviewed and all are negative for acute change except as noted in the HPI.  Physical Exam Updated Vital Signs BP 141/69 (BP Location: Left Arm)   Pulse 74   Temp 97.9 F (36.6 C) (Oral)   Resp 18   Ht 5' 10"  (1.778 m)   SpO2 100%   Physical Exam  Constitutional: He appears well-developed and well-nourished. No distress.  HENT:  Head: Normocephalic and atraumatic.  Mouth/Throat: Oropharynx is clear and moist and mucous membranes are normal. No oropharyngeal exudate.  Eyes: Conjunctivae and EOM are normal. Pupils are equal, round, and reactive to light. Right eye exhibits no discharge. Left eye exhibits no discharge. No scleral icterus.  Neck: Normal range of motion. Neck supple. No JVD present. No thyromegaly present.  Cardiovascular: Normal rate, regular rhythm, normal heart sounds and intact distal pulses.  Exam reveals no gallop and no friction rub.   No murmur heard. Pulmonary/Chest: Effort normal and breath sounds normal. No respiratory distress. He has no wheezes. He has no rales.  Abdominal: Soft. Bowel sounds are normal. He exhibits no distension and no mass. There is tenderness. There is no rebound and no guarding.  Left sided abdominal tenderness  Musculoskeletal: Normal range of motion. He exhibits no edema or tenderness.    Lymphadenopathy:    He has no cervical adenopathy.  Neurological: He is alert. Coordination normal.  Skin: Skin is warm and dry. No rash noted. No erythema.  Psychiatric: He has a normal mood and affect. His behavior is normal.  Nursing note and vitals reviewed.    ED Treatments / Results  DIAGNOSTIC STUDIES: Oxygen Saturation is 100% on RA, normal by my interpretation.    COORDINATION OF CARE: 12:53 AM-Discussed treatment plan which includes labs and pain control with pt at bedside and pt agreed to plan.    Labs (all labs ordered are listed, but only abnormal results are displayed) Labs Reviewed  COMPREHENSIVE METABOLIC PANEL - Abnormal; Notable for the following:       Result Value   Chloride 113 (*)    CO2 20 (*)    Calcium 8.8 (*)    Total Protein 6.3 (*)    Albumin 3.2 (*)    AST 12 (*)    ALT 8 (*)    All other components within normal limits  CBC - Abnormal; Notable for the following:    RBC  3.99 (*)    Hemoglobin 9.8 (*)    HCT 31.0 (*)    MCV 77.7 (*)    MCH 24.6 (*)    RDW 17.5 (*)    All other components within normal limits  URINALYSIS, ROUTINE W REFLEX MICROSCOPIC (NOT AT [facility]) - Abnormal; Notable for the following:    APPearance CLOUDY (*)    Bilirubin Urine SMALL (*)    All other components within normal limits  LIPASE, BLOOD    EKG  EKG Interpretation None       Radiology No results found.  Procedures Procedures (including critical care time)  Medications Ordered in ED Medications  dexamethasone (DECADRON) injection 10 mg (not administered)  HYDROcodone-acetaminophen (NORCO/VICODIN) 5-325 MG per tablet 1 tablet (not administered)  ondansetron (ZOFRAN-ODT) disintegrating tablet 4 mg (not administered)     Initial Impression / Assessment and Plan / ED Course  I have reviewed the triage vital signs and the nursing notes.  Pertinent labs & imaging results that were available during my care of the patient were reviewed by me and  considered in my medical decision making (see chart for details).   Clinical Course   I personally performed the services described in this documentation, which was scribed in my presence. The recorded information has been reviewed and is accurate.  Pt comes in with cc of abd pain, hx of crohns. Pt is stable and has no peritoneal signs on abd exam. His vitals are stable. Pt is non toxic. Likely crohn's flair w/o complication. Will start steroids. Strict ER return precautions have been discussed, and patient is agreeing with the plan and is comfortable with the workup done and the recommendations from the ER.   Final Clinical Impressions(s) / ED Diagnoses   Final diagnoses:  Crohn's disease without complication, unspecified gastrointestinal tract location [facility])    New Prescriptions New Prescriptions   HYDROCODONE-ACETAMINOPHEN (NORCO/VICODIN) 5-325 MG TABLET    Take 1 tablet by mouth every 6 (six) hours as needed.   PREDNISONE (DELTASONE) 10 MG TABLET    Take 6 tablets (60 mg total) by mouth daily.     Varney Biles, MD 01/10/16 [phone]

## 2017-04-01 ENCOUNTER — Emergency Department (HOSPITAL_COMMUNITY)
Admission: EM | Admit: 2017-04-01 | Discharge: 2017-04-01 | Disposition: A | Discharge status: Home / Self Care | Payer: Medicaid Other | Attending: Emergency Medicine | Admitting: Emergency Medicine

## 2017-04-01 ENCOUNTER — Encounter (HOSPITAL_COMMUNITY): Payer: Self-pay

## 2017-04-01 DIAGNOSIS — F17210 Nicotine dependence, cigarettes, uncomplicated: Secondary | ICD-10-CM | POA: Insufficient documentation

## 2017-04-01 DIAGNOSIS — R197 Diarrhea, unspecified: Secondary | ICD-10-CM | POA: Diagnosis present

## 2017-04-01 DIAGNOSIS — K5010 Crohn's disease of large intestine without complications: Secondary | ICD-10-CM | POA: Diagnosis not present

## 2017-04-01 LAB — CBC
HEMATOCRIT: 34 % — AB (ref 39.0–52.0)
HEMOGLOBIN: 11.3 g/dL — AB (ref 13.0–17.0)
MCH: 25.7 pg — AB (ref 26.0–34.0)
MCHC: 33.2 g/dL (ref 30.0–36.0)
MCV: 77.4 fL — ABNORMAL LOW (ref 78.0–100.0)
Platelets: 416 10*3/uL — ABNORMAL HIGH (ref 150–400)
RBC: 4.39 MIL/uL (ref 4.22–5.81)
RDW: 16.2 % — AB (ref 11.5–15.5)
WBC: 11.6 10*3/uL — ABNORMAL HIGH (ref 4.0–10.5)

## 2017-04-01 LAB — COMPREHENSIVE METABOLIC PANEL
ALBUMIN: 2.9 g/dL — AB (ref 3.5–5.0)
ALT: 12 U/L — ABNORMAL LOW (ref 17–63)
ANION GAP: 8 (ref 5–15)
AST: 17 U/L (ref 15–41)
Alkaline Phosphatase: 60 U/L (ref 38–126)
BILIRUBIN TOTAL: 0.4 mg/dL (ref 0.3–1.2)
BUN: 11 mg/dL (ref 6–20)
CO2: 24 mmol/L (ref 22–32)
Calcium: 8.6 mg/dL — ABNORMAL LOW (ref 8.9–10.3)
Chloride: 107 mmol/L (ref 101–111)
Creatinine, Ser: 0.82 mg/dL (ref 0.61–1.24)
GFR calc Af Amer: >60 mL/min (ref >60)
GFR calc non Af Amer: >60 mL/min (ref >60)
GLUCOSE: 90 mg/dL (ref 65–99)
POTASSIUM: 3.4 mmol/L — AB (ref 3.5–5.1)
SODIUM: 139 mmol/L (ref 135–145)
TOTAL PROTEIN: 6.1 g/dL — AB (ref 6.5–8.1)

## 2017-04-01 LAB — URINALYSIS, ROUTINE W REFLEX MICROSCOPIC
BILIRUBIN URINE: NEGATIVE
Glucose, UA: NEGATIVE mg/dL
Hgb urine dipstick: NEGATIVE
KETONES UR: NEGATIVE mg/dL
Leukocytes, UA: NEGATIVE
Nitrite: NEGATIVE
PH: 5 (ref 5.0–8.0)
Protein, ur: NEGATIVE mg/dL
SPECIFIC GRAVITY, URINE: 1.021 (ref 1.005–1.030)

## 2017-04-01 LAB — LIPASE, BLOOD: Lipase: 25 U/L (ref 11–51)

## 2017-04-01 MED ORDER — SODIUM CHLORIDE 0.9 % IV BOLUS (SEPSIS)
1000.0000 mL | Freq: Once | INTRAVENOUS | Status: AC
  Administered 2017-04-01: 1000 mL via INTRAVENOUS

## 2017-04-01 MED ORDER — DEXAMETHASONE SODIUM PHOSPHATE 10 MG/ML IJ SOLN
10.0000 mg | Freq: Once | INTRAMUSCULAR | Status: AC
  Administered 2017-04-01: 10 mg via INTRAVENOUS
  Filled 2017-04-01: qty 1

## 2017-04-01 MED ORDER — ONDANSETRON HCL 4 MG PO TABS
4.0000 mg | ORAL_TABLET | Freq: Four times a day (QID) | ORAL | 0 refills | Status: DC
Start: 2017-04-01 — End: 2017-05-18

## 2017-04-01 MED ORDER — PREDNISONE 20 MG PO TABS: 60.0000 mg | ORAL_TABLET | Freq: Every day | ORAL | 0 refills | Status: DC

## 2017-04-01 MED ORDER — ONDANSETRON HCL 4 MG/2ML IJ SOLN
4.0000 mg | Freq: Once | INTRAMUSCULAR | Status: AC
  Administered 2017-04-01: 4 mg via INTRAVENOUS
  Filled 2017-04-01: qty 2

## 2017-04-01 NOTE — ED Triage Notes (Signed)
Pt comes via [facility] for n/v/d for the past five days.

## 2017-04-01 NOTE — ED Notes (Signed)
Gave pt something to drink per PA for fluid challenge.

## 2017-04-01 NOTE — Discharge Instructions (Signed)
Medications: Prednisone, Zofran  Treatment: Take prednisone as prescribed for 5 days.  Take Zofran every 6 hours as needed for nausea or vomiting.  Make sure to drink plenty of fluids.    Follow-up: Please establish care with a primary care provider by calling 1 of the numbers below.  They can refer you to a gastroenterologist from there.  Please return to emergency department if you develop any new or worsening symptoms.

## 2017-04-01 NOTE — ED Provider Notes (Signed)
[facility] EMERGENCY DEPARTMENT Provider Note   CSN: [id number] Arrival date & time: 04/01/17  0631     History   Chief Complaint Chief Complaint  Patient presents with  . Emesis  . Diarrhea    HPI Shane Klein is a 26 y.o. male with history of Crohn's disease who presents with a 5-day history of nausea, vomiting, diarrhea, crampy lower abdominal pain.  Patient reports he has been able to keep some things down throughout the day, however has worsening vomiting and diarrhea at night in the morning.  He denies any hematemesis or bloody stools.  He reports he may have had fever at home, however he does not have a thermometer.  He has noticed associated sore throat and cough, however he feels like this is related to all the vomiting.  He reports he always has some left lower abdominal pain and cramping, which is unchanged.  He denies any urinary symptoms, chest pain, shortness of breath.  He has not followed by specialist for his Crohn's disease.  HPI  Past Medical History:  Diagnosis Date  . ANEMIA-IRON DEFICIENCY 04/02/2009  . Crohn's disease ([place]) 2010   by colon:cecum and ascending colon, by EGD: duodenum, by imaging also in ileum. No villous atrophy on duodenal biopsies.   Marland Kitchen GERD (gastroesophageal reflux disease)   . GI bleed 04/22/2013   EGD by Dr Teena Irani unremarkable  . Iron deficiency anemia   . Protein-calorie malnutrition, severe ([place]) 04/15/2013  . SBO (small bowel obstruction) ([place]) 2013   in [place] in area of active Crohn's.   . 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[facility])     Patient Active Problem List   Diagnosis Date Noted  . Abdominal pain 08/04/2014  . Thrombocytosis ([place]) 08/04/2014  . Microcytic anemia 08/04/2014  . Abdominal pain, left lower quadrant   . Crohn's colitis ([place]) 07/31/2013  . Partial small bowel obstruction ([place]) 07/31/2013  . Diarrhea 07/31/2013  . Tobacco use 07/31/2013  . Crohn's ileitis ([place]) 07/31/2013  . Pedal edema 04/24/2013  . GI  bleed 04/22/2013  . Edema extremities 04/22/2013  . Protein-calorie malnutrition, severe ([place]) 04/15/2013  . Small bowel obstruction ([place]) 04/14/2013  . GERD (gastroesophageal reflux disease) 11/20/2012  . Crohn's disease of ileum ([place]) 10/05/2012  . Crohn's disease with intestinal obstruction ([place]) 03/01/2012  . Anemia 03/01/2012  . [place] INTESTINE 05/16/2009  . Iron deficiency anemia 04/02/2009  . DUODENITIS 04/02/2009  . ABDOMINAL PAIN OTHER SPECIFIED SITE 04/02/2009  . WEIGHT LOSS-ABNORMAL 01/27/2009    Past Surgical History:  Procedure Laterality Date  . COLONOSCOPY  Oct 2010   Dr. Fuller Plan: evidence of Crohn's at cecum, ascending colon, unable to intubate the terminal ileum. CTE with distal and terminal ileitis  . ESOPHAGOGASTRODUODENOSCOPY  Oct 2010   Dr. Fuller Plan: duodenitis, normal villi, likely Crohn's. Elevated Gliadin but normal TTG, IgA and IgA  . ESOPHAGOGASTRODUODENOSCOPY N/A 04/24/2013   Procedure: ESOPHAGOGASTRODUODENOSCOPY (EGD);  Surgeon: Missy Sabins, MD;  Location: [facility] ENDOSCOPY;  Service: Endoscopy;  Laterality: N/A;       Home Medications    Prior to Admission medications   Medication Sig Start Date End Date Taking? Authorizing Provider  HYDROcodone-acetaminophen (NORCO/VICODIN) 5-325 MG tablet Take 1 tablet by mouth every 6 (six) hours as needed. Patient not taking: Reported on 04/01/2017 01/10/16   Varney Biles, MD  ibuprofen (ADVIL,MOTRIN) 600 MG tablet Take 1 tablet (600 mg total) by mouth every 6 (six) hours as needed. Patient not taking: Reported on 04/01/2017  06/16/15   Horton, Barbette Hair, MD    Family History Family History  Problem Relation Age of Onset  . Diabetes Maternal Aunt   . Diabetes Maternal Grandmother   . Crohn's disease Maternal Aunt   . Hypertension Unknown   . Colon cancer Neg Hx     Social History Social History   Tobacco Use  . Smoking status: Current Every Day Smoker    Packs/day: 0.25    Years:  15.00    Pack years: 3.75    Types: Cigarettes  . Smokeless tobacco: Never Used  Substance Use Topics  . Alcohol use: No    Comment: occasionally  . Drug use: Yes    Types: Marijuana    Comment: occassionally     Allergies   Gluten meal and Wheat bran   Review of Systems Review of Systems  Constitutional: Positive for chills and fever (subjective).  HENT: Positive for sore throat. Negative for facial swelling.   Respiratory: Positive for cough. Negative for shortness of breath.   Cardiovascular: Negative for chest pain.  Gastrointestinal: Positive for abdominal pain, diarrhea and vomiting. Negative for blood in stool and nausea.  Genitourinary: Negative for dysuria.  Musculoskeletal: Negative for back pain.  Skin: Negative for rash and wound.  Neurological: Negative for headaches.  Psychiatric/Behavioral: The patient is not nervous/anxious.      Physical Exam Updated Vital Signs BP 108/64 (BP Location: Left Arm)   Pulse 68   Temp 98.7 F (37.1 C) (Oral)   Resp 16   Ht 5\' 10"  (1.778 m)   Wt 81.6 kg (180 lb)   SpO2 98%   BMI 25.83 kg/m   Physical Exam  Constitutional: He appears well-developed and well-nourished. No distress.  HENT:  Head: Normocephalic and atraumatic.  Mouth/Throat: Oropharynx is clear and moist. No oropharyngeal exudate.  Eyes: Conjunctivae are normal. Pupils are equal, round, and reactive to light. Right eye exhibits no discharge. Left eye exhibits no discharge. No scleral icterus.  Neck: Normal range of motion. Neck supple. No thyromegaly present.  Cardiovascular: Normal rate, regular rhythm, normal heart sounds and intact distal pulses. Exam reveals no gallop and no friction rub.  No murmur heard. Pulmonary/Chest: Effort normal and breath sounds normal. No stridor. No respiratory distress. He has no wheezes. He has no rales.  Abdominal: Soft. Bowel sounds are normal. He exhibits no distension. There is tenderness in the suprapubic area and  left lower quadrant. There is no rebound, no guarding, no CVA tenderness and no tenderness at McBurney's point.  Mild tenderness, patient states this is unchanged from baseline  Musculoskeletal: He exhibits no edema.  Lymphadenopathy:    He has no cervical adenopathy.  Neurological: He is alert. Coordination normal.  Skin: Skin is warm and dry. No rash noted. He is not diaphoretic. No pallor.  Psychiatric: He has a normal mood and affect.  Nursing note and vitals reviewed.    ED Treatments / Results  Labs (all labs ordered are listed, but only abnormal results are displayed) Labs Reviewed  COMPREHENSIVE METABOLIC PANEL - Abnormal; Notable for the following components:      Result Value   Potassium 3.4 (*)    Calcium 8.6 (*)    Total Protein 6.1 (*)    Albumin 2.9 (*)    ALT 12 (*)    All other components within normal limits  CBC - Abnormal; Notable for the following components:   WBC 11.6 (*)    Hemoglobin 11.3 (*)  HCT 34.0 (*)    MCV 77.4 (*)    MCH 25.7 (*)    RDW 16.2 (*)    Platelets 416 (*)    All other components within normal limits  LIPASE, BLOOD  URINALYSIS, ROUTINE W REFLEX MICROSCOPIC    EKG  EKG Interpretation None       Radiology No results found.  Procedures Procedures (including critical care time)  Medications Ordered in ED Medications  sodium chloride 0.9 % bolus 1,000 mL (not administered)  ondansetron (ZOFRAN) injection 4 mg (not administered)     Initial Impression / Assessment and Plan / ED Course  I have reviewed the triage vital signs and the nursing notes.  Pertinent labs & imaging results that were available during my care of the patient were reviewed by me and considered in my medical decision making (see chart for details).     Patient with history of Crohn's colitis.  He reports it feels as a typical flare.  He has had nausea, vomiting, and diarrhea.  He had no bloody stools.  Abdominal exam shows no signs of peritonitis  or change from patient's baseline.  Labs are within normal limits for the patient.  Mild increase in white blood cell count is nonspecific.  Patient is afebrile and without any significant, new abdominal tenderness.  No indication for CT scan at this time.  Patient feeling better and tolerated PO fluids in the ED.  Will discharge home with 5-day burst of prednisone and Zofran.  Patient advised to establish care with a primary care provider and get back in with gastroenterology.  He reports he has not been there in several years because he was incarcerated.  Patient given several resources at discharge.  Return precautions discussed.  Patient understands and agrees with plan.  Patient vitals stable throughout ED course and discharged in satisfactory condition.  Final Clinical Impressions(s) / ED Diagnoses   Final diagnoses:  None    ED Discharge Orders    None       Frederica Kuster, PA-C 04/01/17 1323    Little, Wenda Overland, MD 04/04/17 1454

## 2017-04-01 NOTE — ED Notes (Signed)
ED Provider at bedside. 

## 2017-04-01 NOTE — ED Notes (Signed)
Pt verbalized understanding of dc instructions and medication, vss, ambulatory upon discharge.

## 2017-05-01 ENCOUNTER — Other Ambulatory Visit: Payer: Self-pay

## 2017-05-01 ENCOUNTER — Encounter (HOSPITAL_COMMUNITY): Payer: Self-pay | Admitting: *Deleted

## 2017-05-01 DIAGNOSIS — Z79899 Other long term (current) drug therapy: Secondary | ICD-10-CM | POA: Diagnosis not present

## 2017-05-01 DIAGNOSIS — Z8719 Personal history of other diseases of the digestive system: Secondary | ICD-10-CM | POA: Diagnosis not present

## 2017-05-01 DIAGNOSIS — K529 Noninfective gastroenteritis and colitis, unspecified: Secondary | ICD-10-CM | POA: Diagnosis not present

## 2017-05-01 DIAGNOSIS — F17210 Nicotine dependence, cigarettes, uncomplicated: Secondary | ICD-10-CM | POA: Diagnosis not present

## 2017-05-01 DIAGNOSIS — R1030 Lower abdominal pain, unspecified: Secondary | ICD-10-CM | POA: Diagnosis present

## 2017-05-01 LAB — LIPASE, BLOOD: LIPASE: 34 U/L (ref 11–51)

## 2017-05-01 LAB — COMPREHENSIVE METABOLIC PANEL
ALBUMIN: 2.9 g/dL — AB (ref 3.5–5.0)
ALT: 7 U/L — ABNORMAL LOW (ref 17–63)
ANION GAP: 8 (ref 5–15)
AST: 16 U/L (ref 15–41)
Alkaline Phosphatase: 59 U/L (ref 38–126)
BUN: 8 mg/dL (ref 6–20)
CHLORIDE: 106 mmol/L (ref 101–111)
CO2: 24 mmol/L (ref 22–32)
Calcium: 8.5 mg/dL — ABNORMAL LOW (ref 8.9–10.3)
Creatinine, Ser: 0.96 mg/dL (ref 0.61–1.24)
GFR calc non Af Amer: >60 mL/min (ref >60)
GLUCOSE: 90 mg/dL (ref 65–99)
POTASSIUM: 3.5 mmol/L (ref 3.5–5.1)
SODIUM: 138 mmol/L (ref 135–145)
Total Bilirubin: 0.5 mg/dL (ref 0.3–1.2)
Total Protein: 6.3 g/dL — ABNORMAL LOW (ref 6.5–8.1)

## 2017-05-01 LAB — CBC
HEMATOCRIT: 31.8 % — AB (ref 39.0–52.0)
HEMOGLOBIN: 10.4 g/dL — AB (ref 13.0–17.0)
MCH: 24.9 pg — AB (ref 26.0–34.0)
MCHC: 32.7 g/dL (ref 30.0–36.0)
MCV: 76.3 fL — ABNORMAL LOW (ref 78.0–100.0)
Platelets: 468 10*3/uL — ABNORMAL HIGH (ref 150–400)
RBC: 4.17 MIL/uL — AB (ref 4.22–5.81)
RDW: 16.4 % — ABNORMAL HIGH (ref 11.5–15.5)
WBC: 5.8 10*3/uL (ref 4.0–10.5)

## 2017-05-01 NOTE — ED Triage Notes (Signed)
Pt arrived by [facility] for lower abd pain x 1 week with diarrhea and nausea. Hx of chrohns and took zofran pta. No acute distress is noted at triage.

## 2017-05-02 ENCOUNTER — Emergency Department (HOSPITAL_COMMUNITY)
Admission: EM | Admit: 2017-05-02 | Discharge: 2017-05-02 | Disposition: A | Discharge status: Home / Self Care | Payer: Medicaid Other | Attending: Emergency Medicine | Admitting: Emergency Medicine

## 2017-05-02 DIAGNOSIS — Z8719 Personal history of other diseases of the digestive system: Secondary | ICD-10-CM

## 2017-05-02 DIAGNOSIS — K529 Noninfective gastroenteritis and colitis, unspecified: Secondary | ICD-10-CM

## 2017-05-02 LAB — URINALYSIS, ROUTINE W REFLEX MICROSCOPIC
Bilirubin Urine: NEGATIVE
GLUCOSE, UA: NEGATIVE mg/dL
HGB URINE DIPSTICK: NEGATIVE
KETONES UR: NEGATIVE mg/dL
Leukocytes, UA: NEGATIVE
Nitrite: NEGATIVE
PROTEIN: NEGATIVE mg/dL
Specific Gravity, Urine: 1.02 (ref 1.005–1.030)
pH: 5 (ref 5.0–8.0)

## 2017-05-02 MED ORDER — SODIUM CHLORIDE 0.9 % IV BOLUS (SEPSIS)
1000.0000 mL | Freq: Once | INTRAVENOUS | Status: AC
  Administered 2017-05-02: 1000 mL via INTRAVENOUS

## 2017-05-02 MED ORDER — ONDANSETRON HCL 4 MG/2ML IJ SOLN
4.0000 mg | Freq: Once | INTRAMUSCULAR | Status: AC
  Administered 2017-05-02: 4 mg via INTRAVENOUS
  Filled 2017-05-02: qty 2

## 2017-05-02 MED ORDER — ONDANSETRON 8 MG PO TBDP: ORAL_TABLET | ORAL | 0 refills | Status: DC

## 2017-05-02 MED ORDER — DEXAMETHASONE SODIUM PHOSPHATE 10 MG/ML IJ SOLN
10.0000 mg | Freq: Once | INTRAMUSCULAR | Status: AC
  Administered 2017-05-02: 10 mg via INTRAVENOUS
  Filled 2017-05-02: qty 1

## 2017-05-02 MED ORDER — KETOROLAC TROMETHAMINE 30 MG/ML IJ SOLN
30.0000 mg | Freq: Once | INTRAMUSCULAR | Status: AC
  Administered 2017-05-02: 30 mg via INTRAVENOUS
  Filled 2017-05-02: qty 1

## 2017-05-02 MED ORDER — PREDNISONE 20 MG PO TABS: ORAL_TABLET | ORAL | 0 refills | Status: DC

## 2017-05-02 NOTE — ED Provider Notes (Signed)
[facility] EMERGENCY DEPARTMENT Provider Note   CSN: [id number] Arrival date & time: 05/01/17  1750     History   Chief Complaint Chief Complaint  Patient presents with  . Abdominal Pain    HPI Shane Klein is a 27 y.o. male.  The history is provided by the patient.  Abdominal Pain   This is a recurrent problem. The current episode started more than 2 days ago. The problem occurs constantly. The problem has not changed since onset.The pain is associated with eating. The pain is located in the suprapubic region. The quality of the pain is cramping. The pain is moderate. Associated symptoms include diarrhea and nausea. Pertinent negatives include anorexia, fever, belching, flatus, hematochezia, melena, constipation, dysuria, frequency, hematuria, headaches, arthralgias and myalgias. Nothing aggravates the symptoms. Nothing relieves the symptoms. Past workup does not include GI consult. His past medical history is significant for Crohn's disease. His past medical history does not include PUD.  Passing stools.  No f/c/r.  Girlfriend at home also has n/v/d.    Past Medical History:  Diagnosis Date  . ANEMIA-IRON DEFICIENCY 04/02/2009  . Crohn's disease ([place]) 2010   by colon:cecum and ascending colon, by EGD: duodenum, by imaging also in ileum. No villous atrophy on duodenal biopsies.   Marland Kitchen GERD (gastroesophageal reflux disease)   . GI bleed 04/22/2013   EGD by Dr Teena Irani unremarkable  . Iron deficiency anemia   . Protein-calorie malnutrition, severe ([place]) 04/15/2013  . SBO (small bowel obstruction) ([place]) 2013   in [place] in area of active Crohn's.   . 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[facility])     Patient Active Problem List   Diagnosis Date Noted  . Abdominal pain 08/04/2014  . Thrombocytosis ([place]) 08/04/2014  . Microcytic anemia 08/04/2014  . Abdominal pain, left lower quadrant   . Crohn's colitis ([place]) 07/31/2013  . Partial small bowel obstruction ([place]) 07/31/2013  . Diarrhea  07/31/2013  . Tobacco use 07/31/2013  . Crohn's ileitis ([place]) 07/31/2013  . Pedal edema 04/24/2013  . GI bleed 04/22/2013  . Edema extremities 04/22/2013  . Protein-calorie malnutrition, severe ([place]) 04/15/2013  . Small bowel obstruction ([place]) 04/14/2013  . GERD (gastroesophageal reflux disease) 11/20/2012  . Crohn's disease of ileum ([place]) 10/05/2012  . Crohn's disease with intestinal obstruction ([place]) 03/01/2012  . Anemia 03/01/2012  . [place] INTESTINE 05/16/2009  . Iron deficiency anemia 04/02/2009  . DUODENITIS 04/02/2009  . ABDOMINAL PAIN OTHER SPECIFIED SITE 04/02/2009  . WEIGHT LOSS-ABNORMAL 01/27/2009    Past Surgical History:  Procedure Laterality Date  . COLONOSCOPY  Oct 2010   Dr. Fuller Plan: evidence of Crohn's at cecum, ascending colon, unable to intubate the terminal ileum. CTE with distal and terminal ileitis  . ESOPHAGOGASTRODUODENOSCOPY  Oct 2010   Dr. Fuller Plan: duodenitis, normal villi, likely Crohn's. Elevated Gliadin but normal TTG, IgA and IgA  . ESOPHAGOGASTRODUODENOSCOPY N/A 04/24/2013   Procedure: ESOPHAGOGASTRODUODENOSCOPY (EGD);  Surgeon: Missy Sabins, MD;  Location[facility]COPY;  Service: Endoscopy;  Laterality: N/A;       Home Medications    Prior to Admission medications   Medication Sig Start Date End Date Taking? Authorizing Provider  HYDROcodone-acetaminophen (NORCO/VICODIN) 5-325 MG tablet Take 1 tablet by mouth every 6 (six) hours as needed. Patient not taking: Reported on 04/01/2017 01/10/16   Varney Biles, MD  ibuprofen (ADVIL,MOTRIN) 600 MG tablet Take 1 tablet (600 mg total) by mouth every 6 (six) hours as needed. Patient not taking: Reported on 04/01/2017  06/16/15   Horton, Barbette Hair, MD  ondansetron (ZOFRAN) 4 MG tablet Take 1 tablet (4 mg total) by mouth every 6 (six) hours. 04/01/17   Law, Bea Graff, PA-C  predniSONE (DELTASONE) 20 MG tablet Take 3 tablets (60 mg total) by mouth daily. 04/01/17   Frederica Kuster, PA-C      Family History Family History  Problem Relation Age of Onset  . Diabetes Maternal Aunt   . Diabetes Maternal Grandmother   . Crohn's disease Maternal Aunt   . Hypertension Unknown   . Colon cancer Neg Hx     Social History Social History   Tobacco Use  . Smoking status: Current Every Day Smoker    Packs/day: 0.25    Years: 15.00    Pack years: 3.75    Types: Cigarettes  . Smokeless tobacco: Never Used  Substance Use Topics  . Alcohol use: No    Comment: occasionally  . Drug use: Yes    Types: Marijuana    Comment: occassionally     Allergies   Gluten meal and Wheat bran   Review of Systems Review of Systems  Constitutional: Negative for fever.  Gastrointestinal: Positive for abdominal pain, diarrhea and nausea. Negative for abdominal distention, anorexia, blood in stool, constipation, flatus, hematochezia and melena.  Genitourinary: Negative for dysuria, frequency and hematuria.  Musculoskeletal: Negative for arthralgias and myalgias.  Neurological: Negative for headaches.  All other systems reviewed and are negative.    Physical Exam Updated Vital Signs BP 127/75   Pulse (!) 59   Temp (!) 97.3 F (36.3 C) (Oral)   Resp 18   SpO2 100%   Physical Exam  Constitutional: He is oriented to person, place, and time. He appears well-developed and well-nourished. No distress.  HENT:  Head: Normocephalic and atraumatic.  Nose: Nose normal.  Mouth/Throat: No oropharyngeal exudate.  Eyes: Conjunctivae and EOM are normal. Pupils are equal, round, and reactive to light.  Neck: Normal range of motion. Neck supple.  Cardiovascular: Normal rate, regular rhythm, normal heart sounds and intact distal pulses.  Pulmonary/Chest: Effort normal and breath sounds normal. No stridor. He has no wheezes. He has no rales.  Abdominal: Soft. Bowel sounds are normal. He exhibits no mass. There is no tenderness. There is no rebound and no guarding. No hernia.  Musculoskeletal:  Normal range of motion.  Neurological: He is alert and oriented to person, place, and time. He displays normal reflexes.  Skin: Skin is warm and dry. Capillary refill takes less than 2 seconds.  Psychiatric: He has a normal mood and affect.  Nursing note and vitals reviewed.    ED Treatments / Results  Labs (all labs ordered are listed, but only abnormal results are displayed)  Results for orders placed or performed during the hospital encounter of 05/02/17  Lipase, blood  Result Value Ref Range   Lipase 34 11 - 51 U/L  Comprehensive metabolic panel  Result Value Ref Range   Sodium 138 135 - 145 mmol/L   Potassium 3.5 3.5 - 5.1 mmol/L   Chloride 106 101 - 111 mmol/L   CO2 24 22 - 32 mmol/L   Glucose, Bld 90 65 - 99 mg/dL   BUN 8 6 - 20 mg/dL   Creatinine, Ser 0.96 0.61 - 1.24 mg/dL   Calcium 8.5 (L) 8.9 - 10.3 mg/dL   Total Protein 6.3 (L) 6.5 - 8.1 g/dL   Albumin 2.9 (L) 3.5 - 5.0 g/dL   AST 16 15 - 41  U/L   ALT 7 (L) 17 - 63 U/L   Alkaline Phosphatase 59 38 - 126 U/L   Total Bilirubin 0.5 0.3 - 1.2 mg/dL   GFR calc non Af Amer >60 >60 mL/min   GFR calc Af Amer >60 >60 mL/min   Anion gap 8 5 - 15  CBC  Result Value Ref Range   WBC 5.8 4.0 - 10.5 K/uL   RBC 4.17 (L) 4.22 - 5.81 MIL/uL   Hemoglobin 10.4 (L) 13.0 - 17.0 g/dL   HCT 31.8 (L) 39.0 - 52.0 %   MCV 76.3 (L) 78.0 - 100.0 fL   MCH 24.9 (L) 26.0 - 34.0 pg   MCHC 32.7 30.0 - 36.0 g/dL   RDW 16.4 (H) 11.5 - 15.5 %   Platelets 468 (H) 150 - 400 K/uL   No results found.   Procedures Procedures (including critical care time)  Medications Ordered in ED Medications  sodium chloride 0.9 % bolus 1,000 mL (not administered)  dexamethasone (DECADRON) injection 10 mg (not administered)  ketorolac (TORADOL) 30 MG/ML injection 30 mg (not administered)  ondansetron (ZOFRAN) injection 4 mg (not administered)      Final Clinical Impressions(s) / ED Diagnoses   Well appearing, exam and vitals and labs are  reassuring.  No indication for imaging at this time.    Return for worsening pain, fevers > 100.4 unrelieved by medication,  intractable vomiting, or diarrhea, intractable abdominal pain, Inability to tolerate liquids or food, inability to pass gas or stool, cough, altered mental status or any concerns. No signs of systemic illness or infection. The patient is nontoxic-appearing on exam and vital signs are within normal limits.    I have reviewed the triage vital signs and the nursing notes. Pertinent labs &imaging results that were available during my care of the patient were reviewed by me and considered in my medical decision making (see chart for details).  After history, exam, and medical workup I feel the patient has been appropriately medically screened and is safe for discharge home. Pertinent diagnoses were discussed with the patient. Patient was given return precautions       Aireana Ryland, MD 05/02/17 2035

## 2017-05-02 NOTE — ED Notes (Signed)
Encouraged pt to drink his Gatorade for PO challenge.

## 2017-05-18 ENCOUNTER — Ambulatory Visit (INDEPENDENT_AMBULATORY_CARE_PROVIDER_SITE_OTHER): Payer: Medicaid Other

## 2017-05-18 ENCOUNTER — Ambulatory Visit (HOSPITAL_COMMUNITY)
Admission: EM | Admit: 2017-05-18 | Discharge: 2017-05-18 | Disposition: A | Discharge status: Home / Self Care | Payer: Medicaid Other | Attending: Family Medicine | Admitting: Family Medicine

## 2017-05-18 ENCOUNTER — Encounter (HOSPITAL_COMMUNITY): Payer: Self-pay | Admitting: Emergency Medicine

## 2017-05-18 DIAGNOSIS — R1032 Left lower quadrant pain: Secondary | ICD-10-CM

## 2017-05-18 DIAGNOSIS — R1012 Left upper quadrant pain: Secondary | ICD-10-CM

## 2017-05-18 DIAGNOSIS — Z8719 Personal history of other diseases of the digestive system: Secondary | ICD-10-CM

## 2017-05-18 DIAGNOSIS — R110 Nausea: Secondary | ICD-10-CM | POA: Diagnosis not present

## 2017-05-18 MED ORDER — ONDANSETRON 4 MG PO TBDP
4.0000 mg | ORAL_TABLET | Freq: Once | ORAL | Status: AC
  Administered 2017-05-18: 4 mg via ORAL

## 2017-05-18 MED ORDER — ONDANSETRON 4 MG PO TBDP
ORAL_TABLET | ORAL | Status: AC
  Filled 2017-05-18: qty 1

## 2017-05-18 MED ORDER — HYDROMORPHONE HCL 1 MG/ML IJ SOLN
1.0000 mg | Freq: Once | INTRAMUSCULAR | Status: AC
  Administered 2017-05-18: 1 mg via INTRAMUSCULAR

## 2017-05-18 MED ORDER — PREDNISONE 20 MG PO TABS: 60.0000 mg | ORAL_TABLET | Freq: Every day | ORAL | 0 refills | Status: DC

## 2017-05-18 MED ORDER — OXYCODONE-ACETAMINOPHEN 5-325 MG PO TABS: 1.0000 | ORAL_TABLET | Freq: Four times a day (QID) | ORAL | 0 refills | Status: DC | PRN

## 2017-05-18 MED ORDER — HYDROMORPHONE HCL 1 MG/ML IJ SOLN
INTRAMUSCULAR | Status: AC
  Filled 2017-05-18: qty 1

## 2017-05-18 MED ORDER — ONDANSETRON HCL 4 MG PO TABS: 4.0000 mg | ORAL_TABLET | Freq: Four times a day (QID) | ORAL | 0 refills | Status: DC

## 2017-05-18 NOTE — ED Provider Notes (Signed)
[facility]    CSN: [id number] Arrival date & time: 05/18/17  1825     History   Chief Complaint Chief Complaint  Patient presents with  . Abdominal Pain    HPI Shane Klein is a 27 y.o. male.   27 year old male, with history of Crohn's disease comp gated by previous episode of small bowel infection, presenting today complaining of abdominal pain.  Patient states that 2 days ago, he developed left-sided abdominal pain that radiates through to his back.  He states that this is typical of his usual Crohn's flares.  He is also had some nausea without vomiting.  He denies any fever, chills, diarrhea or constipation.  He states that his pain is not nearly as bad as his small bowel obstruction.  States he has an appointment to see GI on Friday   The history is provided by the patient.  Abdominal Pain  Pain location:  LUQ and LLQ Pain quality: aching   Pain radiates to:  R flank Pain severity:  Moderate Onset quality:  Gradual Duration:  2 days Timing:  Constant Progression:  Unchanged Chronicity:  New Context: not alcohol use, not awakening from sleep, not diet changes, not eating and not laxative use   Relieved by:  Nothing Worsened by:  Eating and movement Ineffective treatments:  Lying down Associated symptoms: nausea   Associated symptoms: no anorexia, no belching, no chest pain, no chills, no constipation, no cough, no dysuria, no fever, no flatus, no hematemesis, no hematochezia, no hematuria, no shortness of breath, no sore throat and no vomiting   Risk factors: no alcohol abuse, no aspirin use and not elderly     Past Medical History:  Diagnosis Date  . ANEMIA-IRON DEFICIENCY 04/02/2009  . Crohn's disease ([place]) 2010   by colon:cecum and ascending colon, by EGD: duodenum, by imaging also in ileum. No villous atrophy on duodenal biopsies.   Marland Kitchen GERD (gastroesophageal reflux disease)   . GI bleed 04/22/2013   EGD by Dr Teena Irani unremarkable  . Iron  deficiency anemia   . Protein-calorie malnutrition, severe ([place]) 04/15/2013  . SBO (small bowel obstruction) ([place]) 2013   in [place] in area of active Crohn's.   . 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[facility])     Patient Active Problem List   Diagnosis Date Noted  . Abdominal pain 08/04/2014  . Thrombocytosis ([place]) 08/04/2014  . Microcytic anemia 08/04/2014  . Abdominal pain, left lower quadrant   . Crohn's colitis ([place]) 07/31/2013  . Partial small bowel obstruction ([place]) 07/31/2013  . Diarrhea 07/31/2013  . Tobacco use 07/31/2013  . Crohn's ileitis ([place]) 07/31/2013  . Pedal edema 04/24/2013  . GI bleed 04/22/2013  . Edema extremities 04/22/2013  . Protein-calorie malnutrition, severe ([place]) 04/15/2013  . Small bowel obstruction ([place]) 04/14/2013  . GERD (gastroesophageal reflux disease) 11/20/2012  . Crohn's disease of ileum ([place]) 10/05/2012  . Crohn's disease with intestinal obstruction ([place]) 03/01/2012  . Anemia 03/01/2012  . [place] INTESTINE 05/16/2009  . Iron deficiency anemia 04/02/2009  . DUODENITIS 04/02/2009  . ABDOMINAL PAIN OTHER SPECIFIED SITE 04/02/2009  . WEIGHT LOSS-ABNORMAL 01/27/2009    Past Surgical History:  Procedure Laterality Date  . COLONOSCOPY  Oct 2010   Dr. Fuller Plan: evidence of Crohn's at cecum, ascending colon, unable to intubate the terminal ileum. CTE with distal and terminal ileitis  . ESOPHAGOGASTRODUODENOSCOPY  Oct 2010   Dr. Fuller Plan: duodenitis, normal villi, likely Crohn's. Elevated Gliadin but normal TTG, IgA and IgA  .  ESOPHAGOGASTRODUODENOSCOPY N/A 04/24/2013   Procedure: ESOPHAGOGASTRODUODENOSCOPY (EGD);  Surgeon: Missy Sabins, MD;  Location: [facility] ENDOSCOPY;  Service: Endoscopy;  Laterality: N/A;       Home Medications    Prior to Admission medications   Medication Sig Start Date End Date Taking? Authorizing Provider  HYDROcodone-acetaminophen (NORCO/VICODIN) 5-325 MG tablet Take 1 tablet by mouth every 6 (six) hours as needed. Patient not  taking: Reported on 04/01/2017 01/10/16   Varney Biles, MD  ibuprofen (ADVIL,MOTRIN) 600 MG tablet Take 1 tablet (600 mg total) by mouth every 6 (six) hours as needed. Patient not taking: Reported on 04/01/2017 06/16/15   Merryl Hacker, MD  ondansetron [facility] ODT) 8 MG disintegrating tablet 8mg  ODT q8 hours prn nausea 05/02/17   Palumbo, April, MD  ondansetron (ZOFRAN) 4 MG tablet Take 1 tablet (4 mg total) by mouth every 6 (six) hours. 05/18/17   Evvie Behrmann C, PA-C  oxyCODONE-acetaminophen (PERCOCET/ROXICET) 5-325 MG tablet Take 1 tablet by mouth every 6 (six) hours as needed for severe pain. 05/18/17   Judeth Gilles C, PA-C  predniSONE (DELTASONE) 20 MG tablet Take 3 tablets (60 mg total) by mouth daily. 05/18/17   Adore Kithcart, Belenda Cruise, PA-C    Family History Family History  Problem Relation Age of Onset  . Diabetes Maternal Aunt   . Diabetes Maternal Grandmother   . Crohn's disease Maternal Aunt   . Hypertension Unknown   . Colon cancer Neg Hx     Social History Social History   Tobacco Use  . Smoking status: Current Every Day Smoker    Packs/day: 0.25    Years: 15.00    Pack years: 3.75    Types: Cigarettes  . Smokeless tobacco: Never Used  Substance Use Topics  . Alcohol use: No    Comment: occasionally  . Drug use: Yes    Types: Marijuana    Comment: occassionally     Allergies   Gluten meal and Wheat bran   Review of Systems Review of Systems  Constitutional: Negative for chills and fever.  HENT: Negative for ear pain and sore throat.   Eyes: Negative for pain and visual disturbance.  Respiratory: Negative for cough and shortness of breath.   Cardiovascular: Negative for chest pain and palpitations.  Gastrointestinal: Positive for abdominal pain and nausea. Negative for anorexia, constipation, flatus, hematemesis, hematochezia and vomiting.  Genitourinary: Negative for dysuria and hematuria.  Musculoskeletal: Negative for arthralgias and back pain.  Skin:  Negative for color change and rash.  Neurological: Negative for seizures and syncope.  All other systems reviewed and are negative.    Physical Exam Triage Vital Signs ED Triage Vitals  Enc Vitals Group     BP 05/18/17 1905 114/71     Pulse Rate 05/18/17 1905 60     Resp 05/18/17 1905 20     Temp 05/18/17 1905 98.2 F (36.8 C)     Temp Source 05/18/17 1905 Oral     SpO2 05/18/17 1905 100 %     Weight --      Height --      Head Circumference --      Peak Flow --      Pain Score 05/18/17 1907 5     Pain Loc --      Pain Edu? --      Excl. in [place]? --    No data found.  Updated Vital Signs BP 114/71 (BP Location: Right Arm)   Pulse 60   Temp  98.2 F (36.8 C) (Oral)   Resp 20   SpO2 100%   Visual Acuity Right Eye Distance:   Left Eye Distance:   Bilateral Distance:    Right Eye Near:   Left Eye Near:    Bilateral Near:     Physical Exam  Constitutional: He appears well-developed and well-nourished.  HENT:  Head: Normocephalic and atraumatic.  Eyes: Conjunctivae are normal.  Neck: Neck supple.  Cardiovascular: Normal rate and regular rhythm.  No murmur heard. Pulmonary/Chest: Effort normal and breath sounds normal. No respiratory distress.  Abdominal: Soft. There is tenderness. There is no rigidity, no rebound and no guarding.    Musculoskeletal: He exhibits no edema.  Neurological: He is alert.  Skin: Skin is warm and dry.  Psychiatric: He has a normal mood and affect.  Nursing note and vitals reviewed.    UC Treatments / Results  Labs (all labs ordered are listed, but only abnormal results are displayed) Labs Reviewed - No data to display  EKG  EKG Interpretation None       Radiology Dg Abd Acute W/chest  Result Date: 05/18/2017 CLINICAL DATA:  27 year old male with a history of Crohn disease EXAM: DG ABDOMEN ACUTE W/ 1V CHEST COMPARISON:  CT 10/30/2015 FINDINGS: Chest: Cardiomediastinal silhouette within normal limits. No evidence of  pneumothorax pleural effusion or confluent airspace disease. Abdomen: No unexpected soft tissue density. No radiopaque foreign body. No unexpected calcification. Gas within stomach, small bowel, colon. No abnormal distention. No focal mucosal thickening identified within the colon. No acute bony abnormality. Scoliotic curvature of the lumbar spine, potentially positional. IMPRESSION: Chest: No radiographic evidence of acute cardiopulmonary disease. Abdomen: Nonobstructive bowel gas pattern. Electronically Signed   By: Corrie Mckusick D.O.   On: 05/18/2017 19:56    Procedures Procedures (including critical care time)  Medications Ordered in UC Medications  ondansetron (ZOFRAN-ODT) disintegrating tablet 4 mg (4 mg Oral Given 05/18/17 1925)  HYDROmorphone (DILAUDID) injection 1 mg (1 mg Intramuscular Given 05/18/17 1925)     Initial Impression / Assessment and Plan / UC Course  I have reviewed the triage vital signs and the nursing notes.  Pertinent labs & imaging results that were available during my care of the patient were reviewed by me and considered in my medical decision making (see chart for details).    Recommended the patient be evaluated in the emergency department.  He refused.  Recurrent abdominal pain secondary to Crohn's disease.  Patient states that these are the typical symptoms of his regular Crohn's flares.  He has no fever or chills.  States that this is not the same as his previous small bowel obstruction.  X-ray done today shows no evidence of an obstruction.  Patient states that Zofran and prednisone usually help.  He has an appointment to see GI on Friday.  Agrees to follow-up in the emergency department if any symptoms worsen.  Final Clinical Impressions(s) / UC Diagnoses   Final diagnoses:  Left lower quadrant pain  History of Crohn's disease    ED Discharge Orders        Ordered    ondansetron (ZOFRAN) 4 MG tablet  Every 6 hours     05/18/17 1916    predniSONE  (DELTASONE) 20 MG tablet  Daily     05/18/17 1916    oxyCODONE-acetaminophen (PERCOCET/ROXICET) 5-325 MG tablet  Every 6 hours PRN     05/18/17 1916       Controlled Substance Prescriptions [place] Controlled Substance Registry consulted?  Yes, I have consulted the [place] Controlled Substances Registry for this patient, and feel the risk/benefit ratio today is favorable for proceeding with this prescription for a controlled substance.   Phebe Colla, [place] 05/18/17 2005

## 2017-05-18 NOTE — ED Triage Notes (Signed)
PT C/O: intermittent abd pain onset 2 days ... Reports hx of Crohn's disease.... Sts food makes sx worse  Sx also include: nausea and diarreha  DENIES: fevers, vomiting  TAKING MEDS: none   A&O x4... NAD... Ambulatory

## 2017-05-18 NOTE — ED Notes (Signed)
Friend came and p/u pt to take him home.

## 2017-07-22 ENCOUNTER — Ambulatory Visit (HOSPITAL_COMMUNITY)
Admission: EM | Admit: 2017-07-22 | Discharge: 2017-07-22 | Disposition: A | Discharge status: Home / Self Care | Payer: Medicaid Other | Attending: Family Medicine | Admitting: Family Medicine

## 2017-07-22 ENCOUNTER — Encounter (HOSPITAL_COMMUNITY): Payer: Self-pay | Admitting: Family Medicine

## 2017-07-22 DIAGNOSIS — K5010 Crohn's disease of large intestine without complications: Secondary | ICD-10-CM | POA: Diagnosis not present

## 2017-07-22 MED ORDER — PREDNISONE 10 MG (48) PO TBPK: ORAL_TABLET | ORAL | 0 refills | Status: DC

## 2017-07-22 NOTE — Discharge Instructions (Addendum)
Treat flare with Prednisone Pack as directed. Please keep f/u with [facility] to proceed with Humira. If you worsen then go to the ED

## 2017-07-22 NOTE — ED Provider Notes (Signed)
[place]    CSN: [id number] Arrival date & time: 07/22/17  1509     History   Chief Complaint Chief Complaint  Patient presents with  . Abdominal Pain    HPI Shane Klein is a 27 y.o. male.   With a history of Chron's colitis. He is under the care of [facility] and is awaiting a prescription for Humira to start therapy. He is having a "severe" flare with diarrhea and generalized pain. He reports this is a typical flare. No bloody diarrhea. No fever or chills.      Past Medical History:  Diagnosis Date  . ANEMIA-IRON DEFICIENCY 04/02/2009  . Crohn's disease ([place]) 2010   by colon:cecum and ascending colon, by EGD: duodenum, by imaging also in ileum. No villous atrophy on duodenal biopsies.   Marland Kitchen GERD (gastroesophageal reflux disease)   . GI bleed 04/22/2013   EGD by Dr Teena Irani unremarkable  . Iron deficiency anemia   . Protein-calorie malnutrition, severe ([place]) 04/15/2013  . SBO (small bowel obstruction) ([place]) 2013   in [place] in area of active Crohn's.   . 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[facility])     Patient Active Problem List   Diagnosis Date Noted  . Abdominal pain 08/04/2014  . Thrombocytosis ([place]) 08/04/2014  . Microcytic anemia 08/04/2014  . Abdominal pain, left lower quadrant   . Crohn's colitis ([place]) 07/31/2013  . Partial small bowel obstruction ([place]) 07/31/2013  . Diarrhea 07/31/2013  . Tobacco use 07/31/2013  . Crohn's ileitis ([place]) 07/31/2013  . Pedal edema 04/24/2013  . GI bleed 04/22/2013  . Edema extremities 04/22/2013  . Protein-calorie malnutrition, severe ([place]) 04/15/2013  . Small bowel obstruction ([place]) 04/14/2013  . GERD (gastroesophageal reflux disease) 11/20/2012  . Crohn's disease of ileum ([place]) 10/05/2012  . Crohn's disease with intestinal obstruction ([place]) 03/01/2012  . Anemia 03/01/2012  . [place] INTESTINE 05/16/2009  . Iron deficiency anemia 04/02/2009  . DUODENITIS 04/02/2009  . ABDOMINAL PAIN OTHER SPECIFIED SITE  04/02/2009  . WEIGHT LOSS-ABNORMAL 01/27/2009    Past Surgical History:  Procedure Laterality Date  . COLONOSCOPY  Oct 2010   Dr. Fuller Plan: evidence of Crohn's at cecum, ascending colon, unable to intubate the terminal ileum. CTE with distal and terminal ileitis  . ESOPHAGOGASTRODUODENOSCOPY  Oct 2010   Dr. Fuller Plan: duodenitis, normal villi, likely Crohn's. Elevated Gliadin but normal TTG, IgA and IgA  . ESOPHAGOGASTRODUODENOSCOPY N/A 04/24/2013   Procedure: ESOPHAGOGASTRODUODENOSCOPY (EGD);  Surgeon: Missy Sabins, MD;  Location: [facility] ENDOSCOPY;  Service: Endoscopy;  Laterality: N/A;       Home Medications    Prior to Admission medications   Medication Sig Start Date End Date Taking? Authorizing Provider  HYDROcodone-acetaminophen (NORCO/VICODIN) 5-325 MG tablet Take 1 tablet by mouth every 6 (six) hours as needed. Patient not taking: Reported on 04/01/2017 01/10/16   Varney Biles, MD  ibuprofen (ADVIL,MOTRIN) 600 MG tablet Take 1 tablet (600 mg total) by mouth every 6 (six) hours as needed. Patient not taking: Reported on 04/01/2017 06/16/15   Merryl Hacker, MD  ondansetron (ZOFRAN ODT) 8 MG disintegrating tablet 8mg  ODT q8 hours prn nausea 05/02/17   Palumbo, April, MD  ondansetron (ZOFRAN) 4 MG tablet Take 1 tablet (4 mg total) by mouth every 6 (six) hours. 05/18/17   Blue, Olivia C, PA-C  predniSONE (STERAPRED UNI-PAK 48 TAB) 10 MG (48) TBPK tablet Take as directed 07/22/17   Bjorn Pippin, PA-C    Family History Family History  Problem Relation Age of Onset  . Diabetes Maternal Aunt   . Diabetes Maternal Grandmother   . Crohn's disease Maternal Aunt   . Hypertension Unknown   . Colon cancer Neg Hx     Social History Social History   Tobacco Use  . Smoking status: Current Every Day Smoker    Packs/day: 0.25    Years: 15.00    Pack years: 3.75    Types: Cigarettes  . Smokeless tobacco: Never Used  Substance Use Topics  . Alcohol use: No    Comment: occasionally  .  Drug use: Yes    Types: Marijuana    Comment: occassionally     Allergies   Gluten meal and Wheat bran   Review of Systems Review of Systems  All other systems reviewed and are negative.    Physical Exam Triage Vital Signs ED Triage Vitals  Enc Vitals Group     BP 07/22/17 1538 114/68     Pulse Rate 07/22/17 1538 83     Resp 07/22/17 1538 18     Temp 07/22/17 1538 98.3 F (36.8 C)     Temp src --      SpO2 07/22/17 1538 100 %     Weight --      Height --      Head Circumference --      Peak Flow --      Pain Score 07/22/17 1536 5     Pain Loc --      Pain Edu? --      Excl. in [place]? --    No data found.  Updated Vital Signs BP 114/68   Pulse 83   Temp 98.3 F (36.8 C)   Resp 18   SpO2 100%   Visual Acuity Right Eye Distance:   Left Eye Distance:   Bilateral Distance:    Right Eye Near:   Left Eye Near:    Bilateral Near:     Physical Exam  Constitutional: He appears well-developed and well-nourished.  Non-toxic appearance. He does not appear ill. No distress.  Appears uncomfortable on exam table  Abdominal: Bowel sounds are normal. He exhibits no ascites and no mass. There is generalized tenderness. There is guarding. There is no rigidity, no rebound and no CVA tenderness.  Mild pain to palpation throughout generalized abdomen. No rigidity  Neurological: He is alert.  Skin: Skin is warm and dry.  Psychiatric: He has a normal mood and affect. His behavior is normal.  Nursing note and vitals reviewed.    UC Treatments / Results  Labs (all labs ordered are listed, but only abnormal results are displayed) Labs Reviewed - No data to display  EKG None Radiology No results found.  Procedures Procedures (including critical care time)  Medications Ordered in UC Medications - No data to display   Initial Impression / Assessment and Plan / UC Course  I have reviewed the triage vital signs and the nursing notes.  Pertinent labs & imaging  results that were available during my care of the patient were reviewed by me and considered in my medical decision making (see chart for details).   Acute colitis flare with non-emergent abdominal exam. Treat with 60mg  of prednisone taper and if worsens then go to the ED. Keep regular f/u with GI for start of therapy    Final Clinical Impressions(s) / UC Diagnoses   Final diagnoses:  Crohn's disease of colon without complication [facility])    ED Discharge Orders  Ordered    predniSONE (STERAPRED UNI-PAK 48 TAB) 10 MG (48) TBPK tablet     07/22/17 1610       Controlled Substance Prescriptions [place] Controlled Substance Registry consulted? Not Applicable   Prudencio Pair 07/22/17 1617

## 2017-07-22 NOTE — ED Triage Notes (Signed)
Pt here for chrons flare up. He has been using some old prednisone that he had at home for pain. sts that it helps some. This started 3 days ago and its a burning sensation with nausea.

## 2017-08-04 ENCOUNTER — Ambulatory Visit
Admission: RE | Admit: 2017-08-04 | Discharge: 2017-08-04 | Disposition: A | Discharge status: Home / Self Care | Payer: Medicaid Other | Source: Ambulatory Visit | Attending: Gastroenterology | Admitting: Gastroenterology

## 2017-08-04 ENCOUNTER — Other Ambulatory Visit: Payer: Self-pay | Admitting: Gastroenterology

## 2017-08-04 DIAGNOSIS — R7612 Nonspecific reaction to cell mediated immunity measurement of gamma interferon antigen response without active tuberculosis: Secondary | ICD-10-CM

## 2017-08-28 ENCOUNTER — Emergency Department (HOSPITAL_COMMUNITY)
Admission: EM | Admit: 2017-08-28 | Discharge: 2017-08-29 | Disposition: A | Discharge status: Home / Self Care | Payer: Medicaid Other | Attending: Emergency Medicine | Admitting: Emergency Medicine

## 2017-08-28 ENCOUNTER — Encounter (HOSPITAL_COMMUNITY): Payer: Self-pay | Admitting: Emergency Medicine

## 2017-08-28 DIAGNOSIS — R1032 Left lower quadrant pain: Secondary | ICD-10-CM | POA: Diagnosis not present

## 2017-08-28 DIAGNOSIS — Z87891 Personal history of nicotine dependence: Secondary | ICD-10-CM | POA: Diagnosis not present

## 2017-08-28 LAB — COMPREHENSIVE METABOLIC PANEL
ALBUMIN: 3.1 g/dL — AB (ref 3.5–5.0)
ALK PHOS: 54 U/L (ref 38–126)
ALT: 11 U/L — ABNORMAL LOW (ref 17–63)
ANION GAP: 7 (ref 5–15)
AST: 11 U/L — AB (ref 15–41)
BILIRUBIN TOTAL: 0.5 mg/dL (ref 0.3–1.2)
BUN: 10 mg/dL (ref 6–20)
CALCIUM: 8.7 mg/dL — AB (ref 8.9–10.3)
CO2: 24 mmol/L (ref 22–32)
Chloride: 107 mmol/L (ref 101–111)
Creatinine, Ser: 0.91 mg/dL (ref 0.61–1.24)
GFR calc Af Amer: >60 mL/min (ref >60)
GFR calc non Af Amer: >60 mL/min (ref >60)
Glucose, Bld: 87 mg/dL (ref 65–99)
Potassium: 3.9 mmol/L (ref 3.5–5.1)
SODIUM: 138 mmol/L (ref 135–145)
Total Protein: 6.5 g/dL (ref 6.5–8.1)

## 2017-08-28 LAB — CBC
HCT: 33.5 % — ABNORMAL LOW (ref 39.0–52.0)
Hemoglobin: 11.2 g/dL — ABNORMAL LOW (ref 13.0–17.0)
MCH: 26.7 pg (ref 26.0–34.0)
MCHC: 33.4 g/dL (ref 30.0–36.0)
MCV: 80 fL (ref 78.0–100.0)
PLATELETS: 426 10*3/uL — AB (ref 150–400)
RBC: 4.19 MIL/uL — AB (ref 4.22–5.81)
RDW: 16.6 % — ABNORMAL HIGH (ref 11.5–15.5)
WBC: 5.3 10*3/uL (ref 4.0–10.5)

## 2017-08-28 LAB — LIPASE, BLOOD: Lipase: 26 U/L (ref 11–51)

## 2017-08-28 NOTE — ED Triage Notes (Signed)
Patient presents to Ed for assessment of LLQ abdominal pain with the urge to have a BM, but difficulty passing stool.  C/o intermittent nausea.  Denies vomiting.  PAtient states hx of crohns.

## 2017-08-29 ENCOUNTER — Emergency Department (HOSPITAL_COMMUNITY): Payer: Medicaid Other

## 2017-08-29 LAB — URINALYSIS, ROUTINE W REFLEX MICROSCOPIC
BILIRUBIN URINE: NEGATIVE
Glucose, UA: NEGATIVE mg/dL
HGB URINE DIPSTICK: NEGATIVE
Ketones, ur: NEGATIVE mg/dL
Leukocytes, UA: NEGATIVE
Nitrite: NEGATIVE
Protein, ur: 30 mg/dL — AB
Specific Gravity, Urine: 1.023 (ref 1.005–1.030)
pH: 5 (ref 5.0–8.0)

## 2017-08-29 LAB — BRAIN NATRIURETIC PEPTIDE: B Natriuretic Peptide: 25.2 pg/mL (ref 0.0–100.0)

## 2017-08-29 MED ORDER — HYDROCODONE-ACETAMINOPHEN 5-325 MG PO TABS
1.0000 | ORAL_TABLET | Freq: Once | ORAL | Status: AC
  Administered 2017-08-29: 1 via ORAL
  Filled 2017-08-29: qty 1

## 2017-08-29 MED ORDER — HYDROCODONE-ACETAMINOPHEN 5-325 MG PO TABS: 1.0000 | ORAL_TABLET | ORAL | 0 refills | Status: DC | PRN

## 2017-08-29 MED ORDER — METHYLPREDNISOLONE SODIUM SUCC 125 MG IJ SOLR: 125.0000 mg | Freq: Once | INTRAMUSCULAR | Status: DC

## 2017-08-29 MED ORDER — MORPHINE SULFATE (PF) 4 MG/ML IV SOLN
4.0000 mg | Freq: Once | INTRAVENOUS | Status: AC
  Administered 2017-08-29: 4 mg via INTRAVENOUS
  Filled 2017-08-29: qty 1

## 2017-08-29 MED ORDER — PREDNISONE 10 MG (21) PO TBPK: ORAL_TABLET | Freq: Every day | ORAL | 0 refills | Status: DC

## 2017-08-29 MED ORDER — IOHEXOL 300 MG/ML  SOLN
100.0000 mL | Freq: Once | INTRAMUSCULAR | Status: AC | PRN
  Administered 2017-08-29: 100 mL via INTRAVENOUS

## 2017-08-29 MED ORDER — PREDNISONE 20 MG PO TABS
60.0000 mg | ORAL_TABLET | Freq: Once | ORAL | Status: AC
  Administered 2017-08-29: 60 mg via ORAL
  Filled 2017-08-29: qty 3

## 2017-08-29 MED ORDER — ONDANSETRON HCL 4 MG/2ML IJ SOLN
4.0000 mg | Freq: Once | INTRAMUSCULAR | Status: AC
  Administered 2017-08-29: 4 mg via INTRAVENOUS
  Filled 2017-08-29: qty 2

## 2017-08-29 NOTE — ED Provider Notes (Signed)
[place] EMERGENCY DEPARTMENT Provider Note   CSN: [id number] Arrival date & time: 08/28/17  2245     History   Chief Complaint Chief Complaint  Patient presents with  . Abdominal Pain    HPI Shane Klein is a 27 y.o. male with a hx of iron deficiency anemia, Crohn's disease, GERD, GI bleed, small bowel obstruction presents to the Emergency Department complaining of gradual, persistent, progressively worsening left lower quadrant abdominal pain onset 2 days ago.  Patient reports associated diarrhea and tenesmus.  He denies melena or hematochezia.  Reports he is previously had to have steroids for his Crohn's disease and has been admitted.  Patient reports last dose of steroids was approximately 1 month ago.  He denies known aggravating or alleviating factors.  Patient rates his pain at a 7/10.  No treatments prior to arrival.  Patient is followed by Dr. Amedeo Plenty.  He reports no previous abdominal surgeries.  Additionally, patient complains of several days of bilateral foot and ankle swelling.  He denies injuries to the legs.  He denies history of DVT, prolonged immobilization, injuries or surgeries.  The history is provided by the patient and medical records. No language interpreter was used.    Past Medical History:  Diagnosis Date  . ANEMIA-IRON DEFICIENCY 04/02/2009  . Crohn's disease ([place]) 2010   by colon:cecum and ascending colon, by EGD: duodenum, by imaging also in ileum. No villous atrophy on duodenal biopsies.   Marland Kitchen GERD (gastroesophageal reflux disease)   . GI bleed 04/22/2013   EGD by Dr Teena Irani unremarkable  . Iron deficiency anemia   . Protein-calorie malnutrition, severe ([place]) 04/15/2013  . SBO (small bowel obstruction) ([place]) 2013   in [place] in area of active Crohn's.   . 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[facility])     Patient Active Problem List   Diagnosis Date Noted  . Abdominal pain 08/04/2014  . Thrombocytosis ([place]) 08/04/2014  . Microcytic anemia 08/04/2014  .  Abdominal pain, left lower quadrant   . Crohn's colitis ([place]) 07/31/2013  . Partial small bowel obstruction ([place]) 07/31/2013  . Diarrhea 07/31/2013  . Tobacco use 07/31/2013  . Crohn's ileitis ([place]) 07/31/2013  . Pedal edema 04/24/2013  . GI bleed 04/22/2013  . Edema extremities 04/22/2013  . Protein-calorie malnutrition, severe ([place]) 04/15/2013  . Small bowel obstruction ([place]) 04/14/2013  . GERD (gastroesophageal reflux disease) 11/20/2012  . Crohn's disease of ileum ([place]) 10/05/2012  . Crohn's disease with intestinal obstruction ([place]) 03/01/2012  . Anemia 03/01/2012  . [place] INTESTINE 05/16/2009  . Iron deficiency anemia 04/02/2009  . DUODENITIS 04/02/2009  . ABDOMINAL PAIN OTHER SPECIFIED SITE 04/02/2009  . WEIGHT LOSS-ABNORMAL 01/27/2009    Past Surgical History:  Procedure Laterality Date  . COLONOSCOPY  Oct 2010   Dr. Fuller Plan: evidence of Crohn's at cecum, ascending colon, unable to intubate the terminal ileum. CTE with distal and terminal ileitis  . ESOPHAGOGASTRODUODENOSCOPY  Oct 2010   Dr. Fuller Plan: duodenitis, normal villi, likely Crohn's. Elevated Gliadin but normal TTG, IgA and IgA  . ESOPHAGOGASTRODUODENOSCOPY N/A 04/24/2013   Procedure: ESOPHAGOGASTRODUODENOSCOPY (EGD);  Surgeon: Missy Sabins, MD;  Location: [facility] ENDOSCOPY;  Service: Endoscopy;  Laterality: N/A;        Home Medications    Prior to Admission medications   Medication Sig Start Date End Date Taking? Authorizing Provider  HYDROcodone-acetaminophen (NORCO/VICODIN) 5-325 MG tablet Take 1 tablet by mouth every 6 (six) hours as needed. Patient not taking: Reported on 04/01/2017 01/10/16  Varney Biles, MD  ibuprofen (ADVIL,MOTRIN) 600 MG tablet Take 1 tablet (600 mg total) by mouth every 6 (six) hours as needed. Patient not taking: Reported on 04/01/2017 06/16/15   Merryl Hacker, MD  ondansetron (ZOFRAN ODT) 8 MG disintegrating tablet 8mg  ODT q8 hours prn nausea Patient not  taking: Reported on 08/29/2017 05/02/17   Palumbo, April, MD  ondansetron (ZOFRAN) 4 MG tablet Take 1 tablet (4 mg total) by mouth every 6 (six) hours. Patient not taking: Reported on 08/29/2017 05/18/17   Blue, Minette Brine C, PA-C  predniSONE (STERAPRED UNI-PAK 48 TAB) 10 MG (48) TBPK tablet Take as directed Patient not taking: Reported on 08/29/2017 07/22/17   Bjorn Pippin, PA-C    Family History Family History  Problem Relation Age of Onset  . Diabetes Maternal Aunt   . Diabetes Maternal Grandmother   . Crohn's disease Maternal Aunt   . Hypertension Unknown   . Colon cancer Neg Hx     Social History Social History   Tobacco Use  . Smoking status: Former Smoker    Packs/day: 0.25    Years: 15.00    Pack years: 3.75    Types: Cigarettes    Last attempt to quit: 04/26/2017    Years since quitting: 0.3  . Smokeless tobacco: Never Used  Substance Use Topics  . Alcohol use: No    Comment: occasionally  . Drug use: Yes    Types: Marijuana    Comment: occassionally     Allergies   Gluten meal and Wheat bran   Review of Systems Review of Systems  Constitutional: Negative for appetite change, diaphoresis, fatigue, fever and unexpected weight change.  HENT: Negative for mouth sores.   Eyes: Negative for visual disturbance.  Respiratory: Negative for cough, chest tightness, shortness of breath and wheezing.   Cardiovascular: Negative for chest pain.  Gastrointestinal: Positive for abdominal pain, diarrhea and nausea. Negative for constipation and vomiting.  Endocrine: Negative for polydipsia, polyphagia and polyuria.  Genitourinary: Negative for dysuria, frequency, hematuria and urgency.  Musculoskeletal: Negative for back pain and neck stiffness.  Skin: Negative for rash.  Allergic/Immunologic: Negative for immunocompromised state.  Neurological: Negative for syncope, light-headedness and headaches.  Hematological: Does not bruise/bleed easily.  Psychiatric/Behavioral: Negative  for sleep disturbance. The patient is not nervous/anxious.      Physical Exam Updated Vital Signs BP 114/72 (BP Location: Right Arm)   Pulse 60   Temp 98.2 F (36.8 C) (Oral)   Resp 16   Ht 5' (1.524 m)   Wt 110.2 kg (243 lb)   SpO2 100%   BMI 47.46 kg/m   Physical Exam  Constitutional: He appears well-developed and well-nourished. No distress.  Awake, alert, nontoxic appearance  HENT:  Head: Normocephalic and atraumatic.  Mouth/Throat: Oropharynx is clear and moist. No oropharyngeal exudate.  Eyes: Conjunctivae are normal. No scleral icterus.  Neck: Normal range of motion. Neck supple.  Cardiovascular: Normal rate, regular rhythm and intact distal pulses.  Pulmonary/Chest: Effort normal and breath sounds normal. No respiratory distress. He has no wheezes.  Equal chest expansion  Abdominal: Soft. Bowel sounds are normal. He exhibits no mass. There is tenderness in the left lower quadrant. There is guarding. There is no rigidity, no rebound and no CVA tenderness.  Mild guarding with palpation of the left lower quadrant.  No rigidity or rebound.  Musculoskeletal: Normal range of motion. He exhibits edema ( Trace, bilateral ankle, nonpitting).  Neurological: He is alert.  Speech is clear and  goal oriented Moves extremities without ataxia  Skin: Skin is warm and dry. He is not diaphoretic.  Psychiatric: He has a normal mood and affect.  Nursing note and vitals reviewed.    ED Treatments / Results  Labs (all labs ordered are listed, but only abnormal results are displayed) Labs Reviewed  COMPREHENSIVE METABOLIC PANEL - Abnormal; Notable for the following components:      Result Value   Calcium 8.7 (*)    Albumin 3.1 (*)    AST 11 (*)    ALT 11 (*)    All other components within normal limits  CBC - Abnormal; Notable for the following components:   RBC 4.19 (*)    Hemoglobin 11.2 (*)    HCT 33.5 (*)    RDW 16.6 (*)    Platelets 426 (*)    All other components within  normal limits  URINALYSIS, ROUTINE W REFLEX MICROSCOPIC - Abnormal; Notable for the following components:   APPearance HAZY (*)    Protein, ur 30 (*)    Bacteria, UA FEW (*)    All other components within normal limits  LIPASE, BLOOD  BRAIN NATRIURETIC PEPTIDE    EKG EKG Interpretation  Date/Time:  Monday Aug 29 2017 05:21:23 EDT Ventricular Rate:  54 PR Interval:  150 QRS Duration: 90 QT Interval:  392 QTC Calculation: 371 R Axis:   73 Text Interpretation:  Sinus bradycardia Early repolarization Otherwise normal ECG No significant change was found Confirmed by Ezequiel Essex [phone]) on 08/29/2017 5:46:57 AM    Procedures Procedures (including critical care time)  Medications Ordered in ED Medications  morphine 4 MG/ML injection 4 mg (4 mg Intravenous Given 08/29/17 0533)  ondansetron (ZOFRAN) injection 4 mg (4 mg Intravenous Given 08/29/17 0533)  iohexol (OMNIPAQUE) 300 MG/ML solution 100 mL (100 mLs Intravenous Contrast Given 08/29/17 0619)     Initial Impression / Assessment and Plan / ED Course  I have reviewed the triage vital signs and the nursing notes.  Pertinent labs & imaging results that were available during my care of the patient were reviewed by me and considered in my medical decision making (see chart for details).  Clinical Course as of Aug 29 648  Mon Aug 29, 2017  0458 No leukocytosis  WBC: 5.3 [HM]  0458 Normal  Lipase: 26 [HM]  0458 Normal AST/ALT  AST(!): 11 [HM]  0458 No evidence of urinary tract infection.  Leukocytes, UA: NEGATIVE [HM]  0458 Pressure elevated at triage, suspect secondary to pain  BP(!): 141/82 [HM]  0458 Pressure has improved throughout time here in the emergency department.  BP: 114/72 [HM]  0458 No fever, tachycardia or hypotension.  No evidence of sepsis.  Temp: 98.2 F (36.8 C) [HM]    Clinical Course User Index [HM] Marlia Schewe, Jarrett Soho, PA-C    Patient presents with lower quadrant abdominal pain and a history of  Crohn's disease.  Suspect Crohn's flare today.  He is afebrile without hypotension.  Leukocytosis.  No evidence of sirs or sepsis.  Urinalysis without evidence of urinary tract infection.  Also complaining of mild bilateral foot and ankle swelling.  Trace, nonpitting edema noted.  Patient has no cardiac history and no history of congestive heart failure.  EKG reassuring.  BNP and chest x-ray pending.  6:48 AM At shift change care was transferred to [facility], PA-C who will follow pending studies, re-evaulate and determine disposition.     Final Clinical Impressions(s) / ED Diagnoses   Final diagnoses:  Left lower quadrant pain    ED Discharge Orders    None       Kearney Evitt, Gwenlyn Perking 08/29/17 1833    Ezequiel Essex, MD 08/30/17 [phone]

## 2017-08-29 NOTE — ED Provider Notes (Signed)
Care assumed from previous provider PA [place]. Please see their note for further details to include full history and physical. To summarize in short pt is a 27 year old male history of Crohn's disease comes to the ED complaining of lower quadrant abdominal pain. Case discussed, plan agreed upon.  At time of care handoff was awaiting CT scan, BNP and chest x-ray.  Previous provider felt that the CT scan was reassuring patient can be discharged home with steroids and pain medication.  BNP was normal.  Chest x-ray showed no signs of edema.  CT scan returned that did show acute Crohn's flare.  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  Patient is afebrile.  No leukocytosis.  [facility]oke with [facility] Alessandra [facility]s with [facility].  Recommends steroids.  Does not feel that patient needs emergent GI or surgery intervention at this time.  Will probably repeat CT scan after acute flare is treated with steroids.  Patient is followed by [facility].  Patient tolerating p.o. fluids appropriately.  Pain is managed in the ED.  Will give dose of prednisone and hydrocodone in the ED.  No signs of peritonitis on reexamination.  She remains hemodynamically stable and afebrile.  Pt is hemodynamically stable, in NAD, & able to ambulate in the ED. Evaluation does not show pathology that would require ongoing emergent intervention or inpatient treatment. I explained the diagnosis to the patient. Pain has been managed & has no complaints prior to dc. Pt is comfortable with above plan and is stable for discharge at this time. All questions were answered prior to disposition. Strict return precautions for f/u to the ED were discussed. Encouraged follow up with PCP.         Doristine Devoid, PA-C 08/29/17 0757    Ezequiel Essex, MD 08/29/17 [phone]

## 2017-08-29 NOTE — Discharge Instructions (Addendum)
Your CT scan does show signs of an acute Crohn's flare.  Start taking the prednisone as prescribed starting tomorrow.  Take the pain medication as needed.  This medication make you drowsy so do not drive with it.  Have discussed her CT findings with you.  It is important that she follow-up with your GI doctor.  Return the ED with any worsening symptoms.

## 2019-05-10 IMAGING — CT CT ABD-PELV W/ CM
2 of 4 series · 16 of 46 positions shown, 18 images · IV contrast (APPLIED)
Comparison: CT of the abdomen pelvis dated 05/18/2017

CLINICAL DATA: 26-year-old male with lower abdominal pain, nausea
and diarrhea. History of Crohn's disease.

EXAM:
CT ABDOMEN AND PELVIS WITH CONTRAST
TECHNIQUE: Multidetector CT imaging of the abdomen and pelvis was performed
using the standard protocol following bolus administration of
intravenous contrast.
CONTRAST:  100mL OMNIPAQUE IOHEXOL 300 MG/ML  SOLN

[Series 3: abdomen 5.0 · axial · 0.76mm/px · z∈[-524,-80]mm · 13 of 101 slices shown, 15 images]
[im 6/101  soft-tissue]
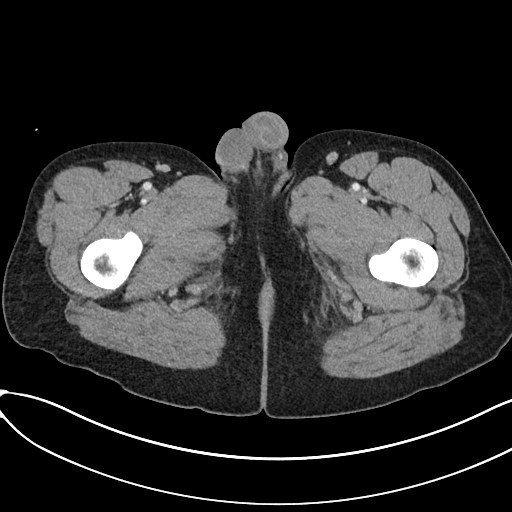
[im 6/101  bone]
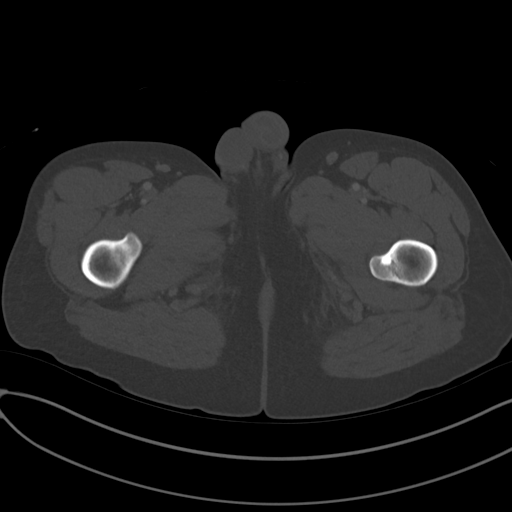
[im 16/101  soft-tissue]
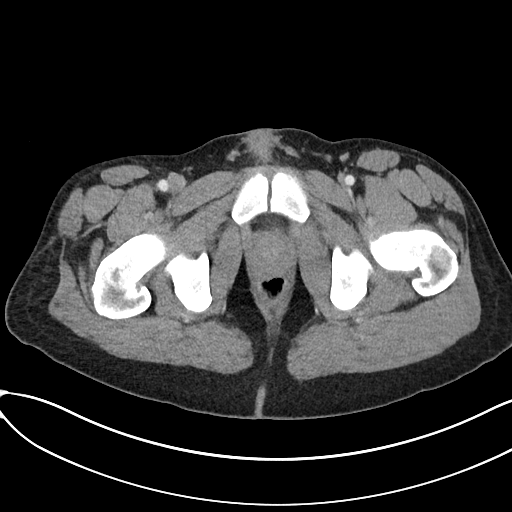
[im 22/101  soft-tissue]
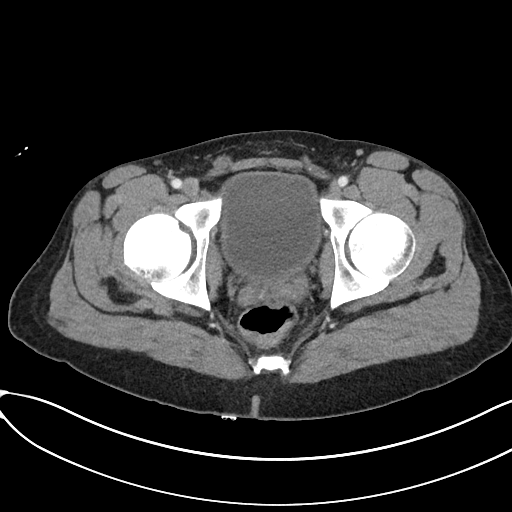
[im 27/101  soft-tissue]
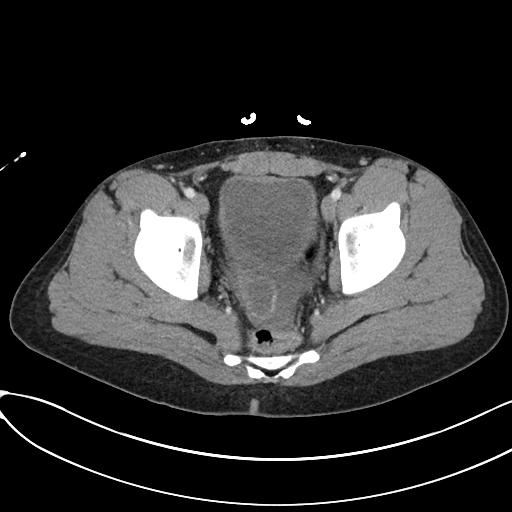
[im 37/101  soft-tissue]
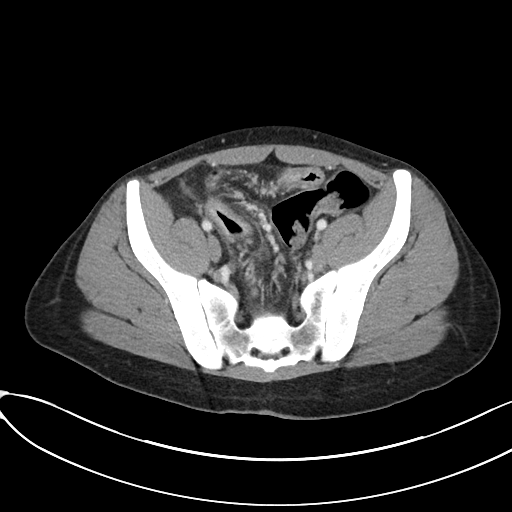
[im 43/101  soft-tissue]
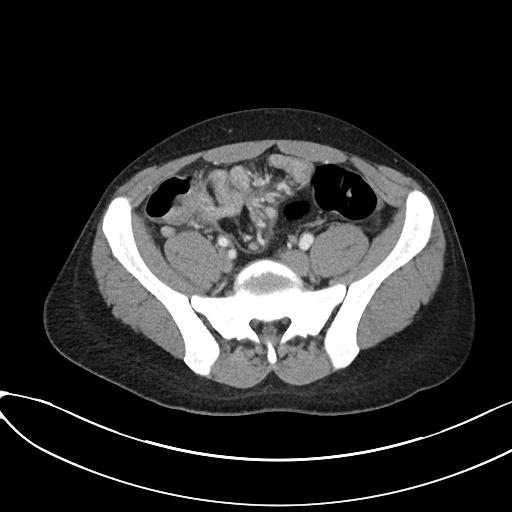
[im 53/101  soft-tissue]
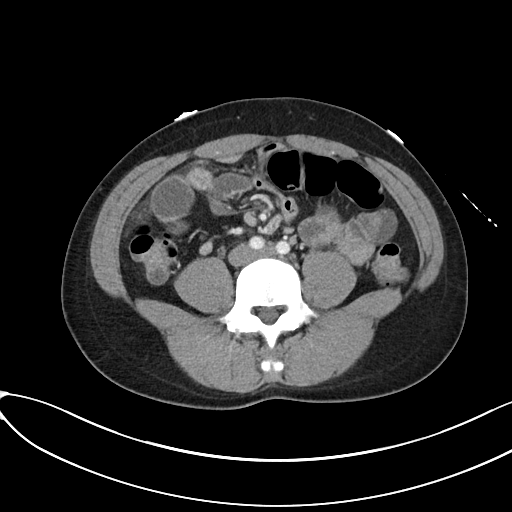
[im 58/101  soft-tissue]
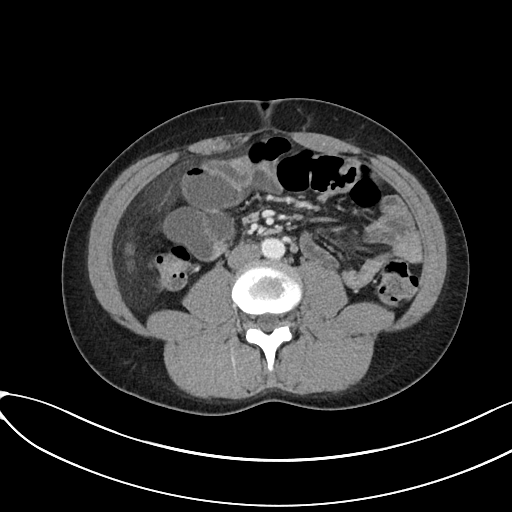
[im 64/101  soft-tissue]
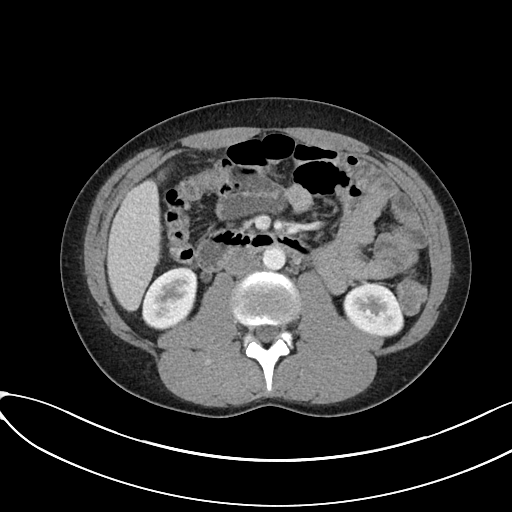
[im 64/101  bone]
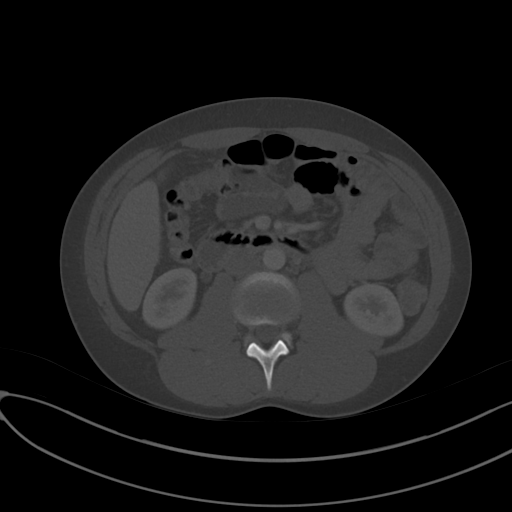
[im 74/101  soft-tissue]
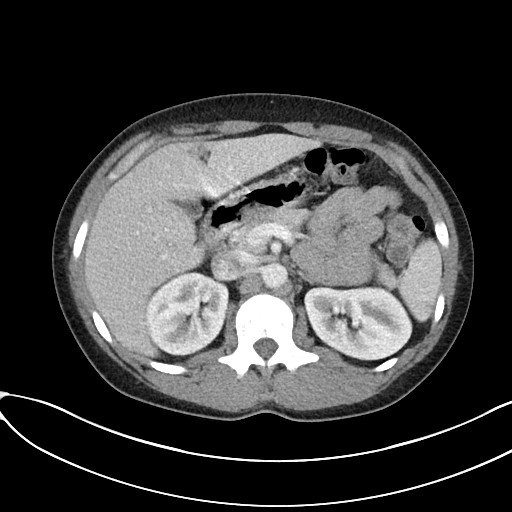
[im 79/101  soft-tissue]
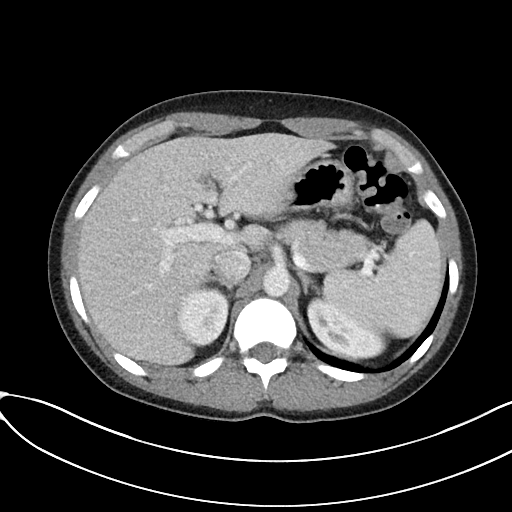
[im 85/101  soft-tissue]
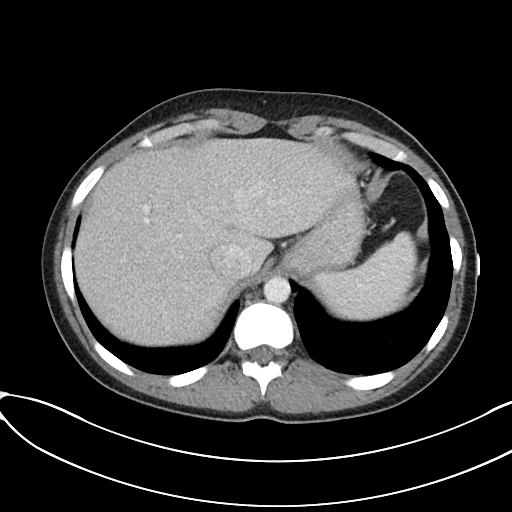
[im 95/101  soft-tissue]
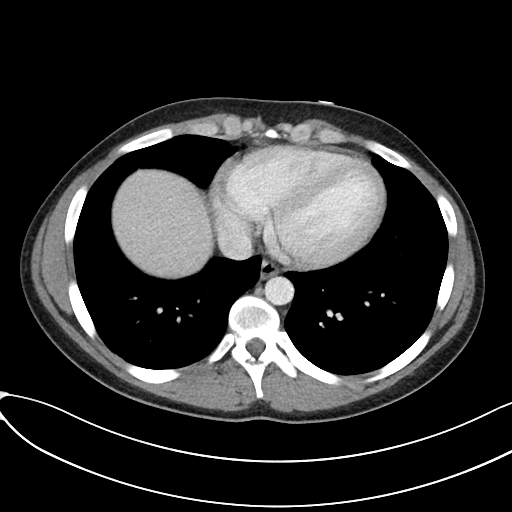

[Series 6: abdomen 3.0 mpr cor · coronal · 0.84mm/px · 3 of 73 slices shown]
[im 25/73  soft-tissue]
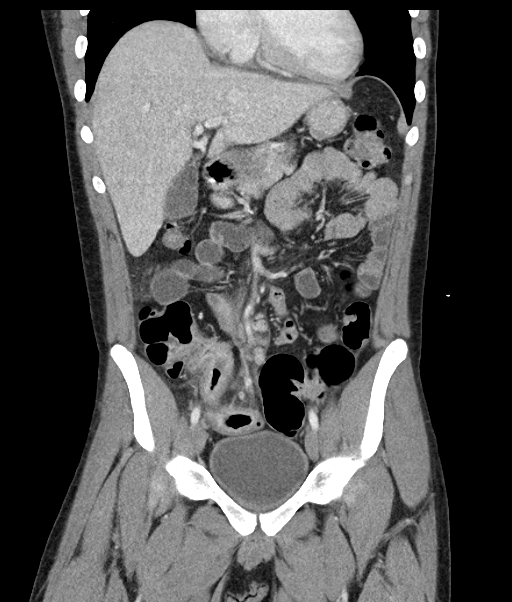
[im 33/73  soft-tissue]
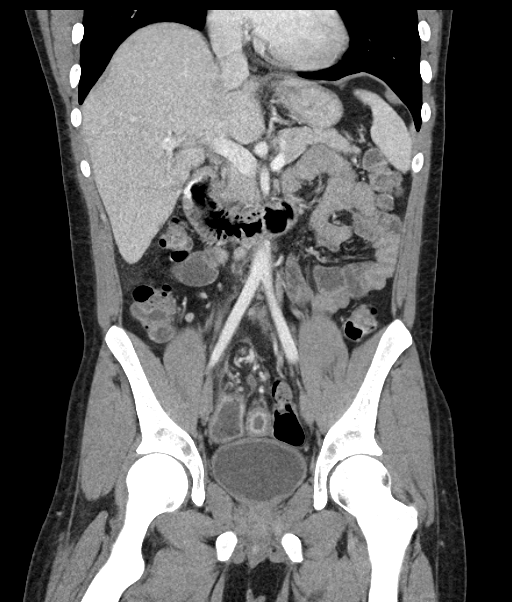
[im 41/73  soft-tissue]
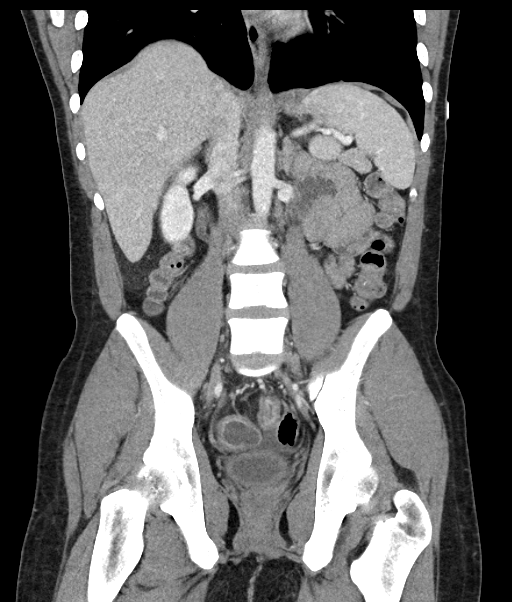

[16 of 46 positions shown; findings below may reference images not displayed]

FINDINGS: Lower chest: The visualized lung bases are clear.

No intra-abdominal free air. There is a small amount of free fluid
within the pelvis.

Hepatobiliary: No focal liver abnormality is seen. No gallstones,
gallbladder wall thickening, or biliary dilatation.

Pancreas: Unremarkable. No pancreatic ductal dilatation or
surrounding inflammatory changes.

Spleen: Subcentimeter splenic hypodense lesion is not characterized.
The spleen is otherwise unremarkable.

Adrenals/Urinary Tract: The adrenal glands, kidneys, visualized
ureters, and urinary bladder appear unremarkable.

Stomach/Bowel: There is diffuse inflammatory changes and thickening
of the distal and terminal ileum with enhancement of the mucosa
consistent with acute Crohn's flare up. There is a focal area of
abutment and tethering of adjacent loops of distal and terminal
ileum in the right lower quadrant (series 6, image 22) consistent
with adhesions. And enteric enteric fistula is not entirely
excluded. There is no bowel obstruction. The appendix is not
identified with certainty but there is no evidence to suggest an
acute appendicitis.

Vascular/Lymphatic: The abdominal aorta and IVC appear unremarkable.
No portal venous gas. There are mildly enlarged right lower quadrant
and lower mesenteric lymph nodes, reactive.

Reproductive: The prostate and seminal vesicles are grossly
unremarkable.

Other: None

Musculoskeletal: No acute or significant osseous findings.
IMPRESSION: 1. Inflammatory changes of the terminal and distal ileum consistent
with active Crohn's flare up. There is focal area of adhesion of the
distal and terminal ileum in the right lower quadrant, sequela of
chronic inflammation. An enteroenteric fistula is not entirely
excluded. No bowel obstruction.
2. Enlarged right lower quadrant and lower mesenteric lymph nodes,
reactive.

## 2019-05-13 ENCOUNTER — Other Ambulatory Visit: Payer: Self-pay

## 2019-05-13 ENCOUNTER — Encounter (HOSPITAL_COMMUNITY): Payer: Self-pay | Admitting: *Deleted

## 2019-05-13 ENCOUNTER — Emergency Department (HOSPITAL_COMMUNITY)
Admission: EM | Admit: 2019-05-13 | Discharge: 2019-05-14 | Disposition: A | Discharge status: Home / Self Care | Payer: Medicaid Other | Attending: Emergency Medicine | Admitting: Emergency Medicine

## 2019-05-13 DIAGNOSIS — K5000 Crohn's disease of small intestine without complications: Secondary | ICD-10-CM | POA: Insufficient documentation

## 2019-05-13 DIAGNOSIS — Z87891 Personal history of nicotine dependence: Secondary | ICD-10-CM | POA: Diagnosis not present

## 2019-05-13 DIAGNOSIS — R1012 Left upper quadrant pain: Secondary | ICD-10-CM | POA: Diagnosis present

## 2019-05-13 LAB — COMPREHENSIVE METABOLIC PANEL WITH GFR
ALT: 9 U/L (ref 0–44)
AST: 11 U/L — ABNORMAL LOW (ref 15–41)
Albumin: 2.4 g/dL — ABNORMAL LOW (ref 3.5–5.0)
Alkaline Phosphatase: 38 U/L (ref 38–126)
Anion gap: 6 (ref 5–15)
BUN: 12 mg/dL (ref 6–20)
CO2: 27 mmol/L (ref 22–32)
Calcium: 8.3 mg/dL — ABNORMAL LOW (ref 8.9–10.3)
Chloride: 108 mmol/L (ref 98–111)
Creatinine, Ser: 0.89 mg/dL (ref 0.61–1.24)
GFR calc Af Amer: >60 mL/min
GFR calc non Af Amer: >60 mL/min
Glucose, Bld: 80 mg/dL (ref 70–99)
Potassium: 3.7 mmol/L (ref 3.5–5.1)
Sodium: 141 mmol/L (ref 135–145)
Total Bilirubin: 0.4 mg/dL (ref 0.3–1.2)
Total Protein: 5.8 g/dL — ABNORMAL LOW (ref 6.5–8.1)

## 2019-05-13 LAB — CBC
HCT: 32.8 % — ABNORMAL LOW (ref 39.0–52.0)
Hemoglobin: 10.3 g/dL — ABNORMAL LOW (ref 13.0–17.0)
MCH: 24.5 pg — ABNORMAL LOW (ref 26.0–34.0)
MCHC: 31.4 g/dL (ref 30.0–36.0)
MCV: 77.9 fL — ABNORMAL LOW (ref 80.0–100.0)
Platelets: 536 10*3/uL — ABNORMAL HIGH (ref 150–400)
RBC: 4.21 MIL/uL — ABNORMAL LOW (ref 4.22–5.81)
RDW: 18.5 % — ABNORMAL HIGH (ref 11.5–15.5)
WBC: 4.4 10*3/uL (ref 4.0–10.5)
nRBC: 0 % (ref 0.0–0.2)

## 2019-05-13 LAB — LIPASE, BLOOD: Lipase: 35 U/L (ref 11–51)

## 2019-05-13 MED ORDER — SODIUM CHLORIDE 0.9% FLUSH
3.0000 mL | Freq: Once | INTRAVENOUS | Status: AC
  Administered 2019-05-14: 3 mL via INTRAVENOUS

## 2019-05-13 NOTE — ED Triage Notes (Signed)
The pt is c/o abd pain for 3 weeks he has crohns disease and he just got tired of the pain so he came in tonight nausea no vomiting

## 2019-05-14 ENCOUNTER — Encounter (HOSPITAL_COMMUNITY): Payer: Self-pay | Admitting: Emergency Medicine

## 2019-05-14 ENCOUNTER — Emergency Department (HOSPITAL_COMMUNITY): Payer: Medicaid Other

## 2019-05-14 LAB — URINALYSIS, ROUTINE W REFLEX MICROSCOPIC
Bilirubin Urine: NEGATIVE
Glucose, UA: NEGATIVE mg/dL
Hgb urine dipstick: NEGATIVE
Ketones, ur: NEGATIVE mg/dL
Leukocytes,Ua: NEGATIVE
Nitrite: NEGATIVE
Protein, ur: NEGATIVE mg/dL
Specific Gravity, Urine: 1.017 (ref 1.005–1.030)
pH: 5 (ref 5.0–8.0)

## 2019-05-14 MED ORDER — FENTANYL CITRATE (PF) 100 MCG/2ML IJ SOLN
100.0000 ug | Freq: Once | INTRAMUSCULAR | Status: AC
  Administered 2019-05-14: 100 ug via INTRAVENOUS
  Filled 2019-05-14: qty 2

## 2019-05-14 MED ORDER — OXYCODONE-ACETAMINOPHEN 5-325 MG PO TABS
1.0000 | ORAL_TABLET | ORAL | Status: AC
  Administered 2019-05-14: 1 via ORAL
  Filled 2019-05-14: qty 1

## 2019-05-14 MED ORDER — PREDNISONE 20 MG PO TABS: ORAL_TABLET | ORAL | 0 refills | Status: DC

## 2019-05-14 MED ORDER — IOHEXOL 300 MG/ML  SOLN
100.0000 mL | Freq: Once | INTRAMUSCULAR | Status: AC | PRN
  Administered 2019-05-14: 100 mL via INTRAVENOUS

## 2019-05-14 MED ORDER — METHYLPREDNISOLONE SODIUM SUCC 125 MG IJ SOLR
125.0000 mg | Freq: Once | INTRAMUSCULAR | Status: AC
  Administered 2019-05-14: 02:00:00 125 mg via INTRAVENOUS
  Filled 2019-05-14: qty 2

## 2019-05-14 MED ORDER — ONDANSETRON HCL 4 MG/2ML IJ SOLN
4.0000 mg | Freq: Once | INTRAMUSCULAR | Status: AC
  Administered 2019-05-14: 4 mg via INTRAVENOUS
  Filled 2019-05-14: qty 2

## 2019-05-14 MED ORDER — TRAMADOL HCL 50 MG PO TABS: 50.0000 mg | ORAL_TABLET | Freq: Four times a day (QID) | ORAL | 0 refills | Status: DC | PRN

## 2019-05-14 NOTE — ED Notes (Signed)
Pt provided with fluids for PO challenge

## 2019-05-14 NOTE — ED Notes (Signed)
Pain meds given per MAR. Name/dob verified with pt. Pt tolerating PO at this time

## 2019-05-14 NOTE — ED Notes (Signed)
Pt ambulatory to rm. 20 from [place]. Pt reports hx of crohns disease. Reports intermittent left quadrant abdominal pain x1.5 weeks. Pt reports he is supposed to take humira but has been off of it for a month. Pt endorses some nausea, denies vomiting. Denies blood in stools. Denies urinary symptoms. Denies any changes in stool from baseline PIV established, 18G in RAC. IV flushes with 10 cc NS without s/s of infiltration. Positive blood return noted. Secured with tape and tegaderm Pt seen by ED provider at bedside.  Pt provided with urinal, able to void 150 cc. Urine collected, labeled with 2 pt identifiers, and sent to lab

## 2019-05-14 NOTE — ED Notes (Signed)
Pt back from CT scan without incidence

## 2019-05-14 NOTE — ED Notes (Signed)
Patient transported to CT in NAD.

## 2019-05-14 NOTE — ED Notes (Signed)
Solumedrol given per MAR. Name/DOB verified with pt.  Pt resting on cart in NAD. Alert, breathing easy, non-labored. VSS on monitors. Equal rise and fall of chest noted. Call light within reach. Hourly rounds completed.

## 2019-05-14 NOTE — ED Notes (Signed)
Patient verbalizes understanding of discharge instructions. Opportunity for questioning and answers were provided. All questions answered. Armband removed by staff, pt discharged from E. Ambulatory with all belongings from ED

## 2019-05-14 NOTE — ED Provider Notes (Signed)
[facility] EMERGENCY DEPARTMENT Provider Note   CSN: [id number] Arrival date & time: 05/13/19  1956     History Chief Complaint  Patient presents with  . Abdominal Pain    Shane Klein is a 29 y.o. male.  The history is provided by the patient.  Abdominal Pain Pain location:  LUQ and LLQ Pain quality: sharp   Pain radiates to:  Does not radiate Pain severity:  Moderate Onset quality:  Gradual Duration:  10 days Timing:  Constant Progression:  Worsening Chronicity:  Recurrent Context: not alcohol use, not recent illness and not sick contacts   Relieved by:  Nothing Worsened by:  Nothing Ineffective treatments:  None tried Associated symptoms: nausea   Associated symptoms: no anorexia, no belching, no chest pain, no chills, no constipation, no cough, no diarrhea, no dysuria, no fatigue, no fever, no flatus, no hematemesis, no hematochezia, no hematuria, no melena, no shortness of breath, no sore throat, no vaginal bleeding and no vomiting   Risk factors: no NSAID use   Patient with Crohn's disease who is off biologics presents for abdominal pain for 10 days duration.  There is associated nausea. No vomiting.  No diarrhea. No f/c/r.  No covid contacts or symptoms.       Past Medical History:  Diagnosis Date  . ANEMIA-IRON DEFICIENCY 04/02/2009  . Crohn's disease ([place]) 2010   by colon:cecum and ascending colon, by EGD: duodenum, by imaging also in ileum. No villous atrophy on duodenal biopsies.   Marland Kitchen GERD (gastroesophageal reflux disease)   . GI bleed 04/22/2013   EGD by Dr Teena Irani unremarkable  . Iron deficiency anemia   . Protein-calorie malnutrition, severe ([place]) 04/15/2013  . SBO (small bowel obstruction) ([place]) 2013   in TI in area of active Crohn's.   . 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[facility])     Patient Active Problem List   Diagnosis Date Noted  . Abdominal pain 08/04/2014  . Thrombocytosis ([place]) 08/04/2014  . Microcytic anemia 08/04/2014  . Abdominal  pain, left lower quadrant   . Crohn's colitis ([place]) 07/31/2013  . Partial small bowel obstruction ([place]) 07/31/2013  . Diarrhea 07/31/2013  . Tobacco use 07/31/2013  . Crohn's ileitis ([place]) 07/31/2013  . Pedal edema 04/24/2013  . GI bleed 04/22/2013  . Edema extremities 04/22/2013  . Protein-calorie malnutrition, severe ([place]) 04/15/2013  . Small bowel obstruction ([place]) 04/14/2013  . GERD (gastroesophageal reflux disease) 11/20/2012  . Crohn's disease of ileum ([place]) 10/05/2012  . Crohn's disease with intestinal obstruction ([place]) 03/01/2012  . Anemia 03/01/2012  . [place] INTESTINE 05/16/2009  . Iron deficiency anemia 04/02/2009  . DUODENITIS 04/02/2009  . ABDOMINAL PAIN OTHER SPECIFIED SITE 04/02/2009  . WEIGHT LOSS-ABNORMAL 01/27/2009    Past Surgical History:  Procedure Laterality Date  . COLONOSCOPY  Oct 2010   Dr. Fuller Plan: evidence of Crohn's at cecum, ascending colon, unable to intubate the terminal ileum. CTE with distal and terminal ileitis  . ESOPHAGOGASTRODUODENOSCOPY  Oct 2010   Dr. Fuller Plan: duodenitis, normal villi, likely Crohn's. Elevated Gliadin but normal TTG, IgA and IgA  . ESOPHAGOGASTRODUODENOSCOPY N/A 04/24/2013   Procedure: ESOPHAGOGASTRODUODENOSCOPY (EGD);  Surgeon: Missy Sabins, MD;  Location: [facility] ENDOSCOPY;  Service: Endoscopy;  Laterality: N/A;       Family History  Problem Relation Age of Onset  . Diabetes Maternal Aunt   . Diabetes Maternal Grandmother   . Crohn's disease Maternal Aunt   . Hypertension Other   . Colon cancer Neg  Hx     Social History   Tobacco Use  . Smoking status: Former Smoker    Packs/day: 0.25    Years: 15.00    Pack years: 3.75    Types: Cigarettes    Quit date: 04/26/2017    Years since quitting: 2.0  . Smokeless tobacco: Never Used  Substance Use Topics  . Alcohol use: No    Comment: occasionally  . Drug use: Yes    Types: Marijuana    Comment: occassionally    Home Medications Prior to  Admission medications   Medication Sig Start Date End Date Taking? Authorizing Provider  HYDROcodone-acetaminophen (NORCO/VICODIN) 5-325 MG tablet Take 1-2 tablets by mouth every 4 (four) hours as needed. 08/29/17   Doristine Devoid, PA-C  ibuprofen (ADVIL,MOTRIN) 600 MG tablet Take 1 tablet (600 mg total) by mouth every 6 (six) hours as needed. Patient not taking: Reported on 04/01/2017 06/16/15   Merryl Hacker, MD  ondansetron (ZOFRAN ODT) 8 MG disintegrating tablet 72m ODT q8 hours prn nausea Patient not taking: Reported on 08/29/2017 05/02/17   Archana Eckman, MD  ondansetron (ZOFRAN) 4 MG tablet Take 1 tablet (4 mg total) by mouth every 6 (six) hours. Patient not taking: Reported on 08/29/2017 05/18/17   Blue, OMinette BrineC, PA-C  predniSONE (STERAPRED UNI-PAK 21 TAB) 10 MG (21) TBPK tablet Take by mouth daily. Take 6 tabs by mouth daily  for 1 day , then 5 tabs for 2 days, then 4 tabs for 2 days, then 3 tabs for 2 days, 2 tabs for 2 days, then 1 tab by mouth daily for 2 days 08/30/17   LOcie CornfieldT, PA-C    Allergies    Gluten meal and Wheat bran  Review of Systems   Review of Systems  Constitutional: Negative for chills, fatigue and fever.  HENT: Negative for sore throat.   Eyes: Negative for visual disturbance.  Respiratory: Negative for cough and shortness of breath.   Cardiovascular: Negative for chest pain.  Gastrointestinal: Positive for abdominal pain and nausea. Negative for anorexia, constipation, diarrhea, flatus, hematemesis, hematochezia, melena and vomiting.  Genitourinary: Negative for dysuria, hematuria and vaginal bleeding.  Musculoskeletal: Negative for arthralgias.  Neurological: Negative for dizziness.  Psychiatric/Behavioral: Negative for agitation.  All other systems reviewed and are negative.   Physical Exam Updated Vital Signs BP 119/73   Pulse (!) 57   Temp 98.6 F (37 C) (Oral)   Resp 16   Ht 5' 10"  (1.778 m)   Wt 81.6 kg   SpO2 100%   BMI 25.83  kg/m   Physical Exam Vitals and nursing note reviewed.  Constitutional:      General: He is not in acute distress.    Appearance: Normal appearance.  HENT:     Head: Normocephalic and atraumatic.     Nose: Nose normal.  Eyes:     Conjunctiva/sclera: Conjunctivae normal.     Pupils: Pupils are equal, round, and reactive to light.  Cardiovascular:     Rate and Rhythm: Normal rate and regular rhythm.     Pulses: Normal pulses.     Heart sounds: Normal heart sounds.  Pulmonary:     Effort: Pulmonary effort is normal.     Breath sounds: Normal breath sounds.  Abdominal:     General: Abdomen is flat. Bowel sounds are normal.     Tenderness: There is no abdominal tenderness. There is no guarding or rebound.  Musculoskeletal:  General: Normal range of motion.     Cervical back: Normal range of motion and neck supple.  Skin:    General: Skin is warm and dry.     Capillary Refill: Capillary refill takes less than 2 seconds.  Neurological:     General: No focal deficit present.     Mental Status: He is alert and oriented to person, place, and time.  Psychiatric:        Mood and Affect: Mood normal.        Behavior: Behavior normal.     ED Results / Procedures / Treatments   Labs (all labs ordered are listed, but only abnormal results are displayed) Results for orders placed or performed during the hospital encounter of 05/13/19  Lipase, blood  Result Value Ref Range   Lipase 35 11 - 51 U/L  Comprehensive metabolic panel  Result Value Ref Range   Sodium 141 135 - 145 mmol/L   Potassium 3.7 3.5 - 5.1 mmol/L   Chloride 108 98 - 111 mmol/L   CO2 27 22 - 32 mmol/L   Glucose, Bld 80 70 - 99 mg/dL   BUN 12 6 - 20 mg/dL   Creatinine, Ser 0.89 0.61 - 1.24 mg/dL   Calcium 8.3 (L) 8.9 - 10.3 mg/dL   Total Protein 5.8 (L) 6.5 - 8.1 g/dL   Albumin 2.4 (L) 3.5 - 5.0 g/dL   AST 11 (L) 15 - 41 U/L   ALT 9 0 - 44 U/L   Alkaline Phosphatase 38 38 - 126 U/L   Total Bilirubin 0.4  0.3 - 1.2 mg/dL   GFR calc non Af Amer >60 >60 mL/min   GFR calc Af Amer >60 >60 mL/min   Anion gap 6 5 - 15  CBC  Result Value Ref Range   WBC 4.4 4.0 - 10.5 K/uL   RBC 4.21 (L) 4.22 - 5.81 MIL/uL   Hemoglobin 10.3 (L) 13.0 - 17.0 g/dL   HCT 32.8 (L) 39.0 - 52.0 %   MCV 77.9 (L) 80.0 - 100.0 fL   MCH 24.5 (L) 26.0 - 34.0 pg   MCHC 31.4 30.0 - 36.0 g/dL   RDW 18.5 (H) 11.5 - 15.5 %   Platelets 536 (H) 150 - 400 K/uL   nRBC 0.0 0.0 - 0.2 %  Urinalysis, Routine w reflex microscopic  Result Value Ref Range   Color, Urine YELLOW YELLOW   APPearance CLEAR CLEAR   Specific Gravity, Urine 1.017 1.005 - 1.030   pH 5.0 5.0 - 8.0   Glucose, UA NEGATIVE NEGATIVE mg/dL   Hgb urine dipstick NEGATIVE NEGATIVE   Bilirubin Urine NEGATIVE NEGATIVE   Ketones, ur NEGATIVE NEGATIVE mg/dL   Protein, ur NEGATIVE NEGATIVE mg/dL   Nitrite NEGATIVE NEGATIVE   Leukocytes,Ua NEGATIVE NEGATIVE   CT ABDOMEN PELVIS W CONTRAST  Result Date: 05/14/2019 CLINICAL DATA:  Pain.  History of Crohn's disease. EXAM: CT ABDOMEN AND PELVIS WITH CONTRAST TECHNIQUE: Multidetector CT imaging of the abdomen and pelvis was performed using the standard protocol following bolus administration of intravenous contrast. CONTRAST:  158m OMNIPAQUE IOHEXOL 300 MG/ML  SOLN COMPARISON:  Aug 29, 2017 FINDINGS: Lower chest: The lung bases are clear. The heart size is normal. Hepatobiliary: The liver is normal. Normal gallbladder.There is no biliary ductal dilation. Pancreas: Normal contours without ductal dilatation. No peripancreatic fluid collection. Spleen: No splenic laceration or hematoma. Adrenals/Urinary Tract: --Adrenal glands: No adrenal hemorrhage. --Right kidney/ureter: No hydronephrosis or perinephric hematoma. --Left kidney/ureter: No hydronephrosis  or perinephric hematoma. --Urinary bladder: Bladder is decompressed which limits evaluation. Stomach/Bowel: --Stomach/Duodenum: No hiatal hernia or other gastric abnormality. Normal  duodenal course and caliber. --Small bowel: There is diffuse wall thickening of the terminal ileum consistent with active Crohn's disease. There are matted loops of small bowel likely related to chronic inflammation and adhesions. Enteroenteric fistula are difficult to exclude at this level. --Colon: There is liquid stool in the colon. --Appendix: The appendix is not reliably identified on this study. Vascular/Lymphatic: Normal course and caliber of the major abdominal vessels. --No retroperitoneal lymphadenopathy. --there are enlarged mesenteric lymph nodes, presumably reactive. --No pelvic or inguinal lymphadenopathy. Reproductive: Unremarkable Other: There is a small volume of free fluid in the abdomen and pelvis, likely reactive. The abdominal wall is normal. Musculoskeletal. No acute displaced fractures. IMPRESSION: 1. Findings consistent with active Crohn's disease involving the terminal ileum. Again noted are adhesions in the right lower quadrant without evidence for a small bowel obstruction. Enteroenteric fistula are difficult to exclude at this level. 2. Small volume reactive free fluid in the abdomen and pelvis. 3. Reactive mesenteric lymph nodes are noted. Electronically Signed   By: Constance Holster M.D.   On: 05/14/2019 01:11    Radiology CT ABDOMEN PELVIS W CONTRAST  Result Date: 05/14/2019 CLINICAL DATA:  Pain.  History of Crohn's disease. EXAM: CT ABDOMEN AND PELVIS WITH CONTRAST TECHNIQUE: Multidetector CT imaging of the abdomen and pelvis was performed using the standard protocol following bolus administration of intravenous contrast. CONTRAST:  174m OMNIPAQUE IOHEXOL 300 MG/ML  SOLN COMPARISON:  Aug 29, 2017 FINDINGS: Lower chest: The lung bases are clear. The heart size is normal. Hepatobiliary: The liver is normal. Normal gallbladder.There is no biliary ductal dilation. Pancreas: Normal contours without ductal dilatation. No peripancreatic fluid collection. Spleen: No splenic laceration  or hematoma. Adrenals/Urinary Tract: --Adrenal glands: No adrenal hemorrhage. --Right kidney/ureter: No hydronephrosis or perinephric hematoma. --Left kidney/ureter: No hydronephrosis or perinephric hematoma. --Urinary bladder: Bladder is decompressed which limits evaluation. Stomach/Bowel: --Stomach/Duodenum: No hiatal hernia or other gastric abnormality. Normal duodenal course and caliber. --Small bowel: There is diffuse wall thickening of the terminal ileum consistent with active Crohn's disease. There are matted loops of small bowel likely related to chronic inflammation and adhesions. Enteroenteric fistula are difficult to exclude at this level. --Colon: There is liquid stool in the colon. --Appendix: The appendix is not reliably identified on this study. Vascular/Lymphatic: Normal course and caliber of the major abdominal vessels. --No retroperitoneal lymphadenopathy. --there are enlarged mesenteric lymph nodes, presumably reactive. --No pelvic or inguinal lymphadenopathy. Reproductive: Unremarkable Other: There is a small volume of free fluid in the abdomen and pelvis, likely reactive. The abdominal wall is normal. Musculoskeletal. No acute displaced fractures. IMPRESSION: 1. Findings consistent with active Crohn's disease involving the terminal ileum. Again noted are adhesions in the right lower quadrant without evidence for a small bowel obstruction. Enteroenteric fistula are difficult to exclude at this level. 2. Small volume reactive free fluid in the abdomen and pelvis. 3. Reactive mesenteric lymph nodes are noted. Electronically Signed   By: CConstance HolsterM.D.   On: 05/14/2019 01:11    Procedures Procedures (including critical care time)  Medications Ordered in ED Medications  methylPREDNISolone sodium succinate (SOLU-MEDROL) 125 mg/2 mL injection 125 mg (has no administration in time range)  sodium chloride flush (NS) 0.9 % injection 3 mL (3 mLs Intravenous Given 05/14/19 0027)  fentaNYL  (SUBLIMAZE) injection 100 mcg (100 mcg Intravenous Given 05/14/19 0027)  ondansetron (ZOFRAN) injection  4 mg (4 mg Intravenous Given 05/14/19 0027)  iohexol (OMNIPAQUE) 300 MG/ML solution 100 mL (100 mLs Intravenous Contrast Given 05/14/19 0052)    ED Course  I have reviewed the triage vital signs and the nursing notes.  Pertinent labs & imaging results that were available during my care of the patient were reviewed by me and considered in my medical decision making (see chart for details).  Clinical Course as of May 14 123  Mon May 14, 2019  0120 BUN: 12 [PC]    Clinical Course User Index [PC] Fatima Blank, MD    Doing well in the ED post medication.  PO challenged successfully. Well appearing will start outpatient steroids.  Patient should follow up with with their gastroenterologist.    Shane Klein was evaluated in Emergency Department on 05/14/2019 for the symptoms described in the history of present illness. He was evaluated in the context of the global COVID-19 pandemic, which necessitated consideration that the patient might be at risk for infection with the SARS-CoV-2 virus that causes COVID-19. Institutional protocols and algorithms that pertain to the evaluation of patients at risk for COVID-19 are in a state of rapid change based on information released by regulatory bodies including the CDC and federal and state organizations. These policies and algorithms were followed during the patient's care in the ED.  Final Clinical Impression(s) / ED Diagnoses Return for intractable cough, coughing up blood,fevers >100.4 unrelieved by medication, shortness of breath, intractable vomiting, chest pain, shortness of breath, weakness,numbness, changes in speech, facial asymmetry,abdominal pain, passing out,Inability to tolerate liquids or food, cough, altered mental status or any concerns. No signs of systemic illness or infection. The patient is nontoxic-appearing on exam  and vital signs are within normal limits.   I have reviewed the triage vital signs and the nursing notes. Pertinent labs &imaging results that were available during my care of the patient were reviewed by me and considered in my medical decision making (see chart for details).  After history, exam, and medical workup I feel the patient has been appropriately medically screened and is safe for discharge home. Pertinent diagnoses were discussed with the patient. Patient was given return      Auset Fritzler, MD 05/14/19 0145

## 2019-06-18 ENCOUNTER — Ambulatory Visit: Payer: Medicaid Other | Admitting: Internal Medicine

## 2019-06-27 ENCOUNTER — Ambulatory Visit: Payer: Medicaid Other | Admitting: Internal Medicine

## 2019-07-13 ENCOUNTER — Telehealth: Payer: Self-pay

## 2019-07-16 ENCOUNTER — Ambulatory Visit: Payer: Medicaid Other | Admitting: Internal Medicine

## 2019-07-16 NOTE — Telephone Encounter (Signed)
COVID-19 Pre-Screening Questions:  Do you currently have a fever (>100 F), chills or unexplained body aches? Patient said he has Fever and chills. Patient can either reschedule or have appointment converted in to a E-Visit.   Are you currently experiencing new cough, shortness of breath, sore throat, runny nose?No .  Have you recently travelled outside the state of [place] in the last 14 days?No .  Have you been in contact with someone that is currently pending confirmation of Covid19 testing or has been confirmed to have the [place] virus?  Patient has not had a covid test done .    **If the patient answers NO to ALL questions -  advise the patient to please call the clinic before coming to the office should any symptoms develop.    **If the patient answers YES to any of the questions above please forward the call to [facility] Nurse Triage Pool to address the next questions.     Rule Out need for immediate evaluation in the ER/Hospital:  . Life Threatening Symptoms (go to hospital right away) - gasping for air or cannot talk without stopping to breathe, blue-colored lips/face, severe and constant chest pain or pressure, severe and constant light-headedness or dizziness, confusion (worse than normal), unconscious or hard to wake up, slurred speech, new seizures  . Moderate to severe Symptoms - (stay in close contact with healthcare team, likely need ER or Urgent Care Evaluation) difficulty breathing, hard to breathe and do anything else including talking, coughing up blood, signs of low blood pressure (feeling cold, pale/clammy skin, light-headed, too weak to stand)   Advise the patient that they will need to stay home, monitor symptoms, treat symptomatically and stay away from other members of their household until they are: 1) fever free for 3 days without anti-fever meds AND  2) symptoms are improving AND  3) It has been at least 7 days since symptoms started.    **Recommend patient  to schedule E-visit to be evaluated or call PCP office to discuss.    Patient Advice (Discuss or send via MyChart):  1. Stay home from work, school, and away from other public places. If you must go out, avoid using any kind of public transportation, ridesharing, or taxis/Ubers/Lyfts. 2. Monitor your symptoms carefully. If your symptoms get worse, call your healthcare team immediately. 3. Get rest and stay hydrated. 4. If you have a medical appointment, call the healthcare provider ahead of time and tell them that you have symptoms that may be due to COVID-19. 5. For medical emergencies, call 911 and notify the dispatch personnel that you have may have COVID-19. 6. Cover your cough and sneezes with inside elbow or tissue - dispose of tissue in trash.  7. Wash your hands often with soap and water for at least 20 seconds or clean your hands with an alcohol-based hand sanitizer that contains at least 60% alcohol. 8. As much as possible, stay in a specific room and away from other people in your home. Also, you should use a separate bathroom, if available. If you need to be around other people in or outside of the home, wear a facemask. 9. Avoid sharing personal items with other people in your household, like dishes, towels, and bedding

## 2019-07-17 ENCOUNTER — Encounter (HOSPITAL_COMMUNITY): Payer: Self-pay

## 2019-07-17 ENCOUNTER — Other Ambulatory Visit: Payer: Self-pay

## 2019-07-17 ENCOUNTER — Emergency Department (HOSPITAL_COMMUNITY)
Admission: EM | Admit: 2019-07-17 | Discharge: 2019-07-18 | Disposition: A | Discharge status: Home / Self Care | Payer: Medicaid Other | Attending: Emergency Medicine | Admitting: Emergency Medicine

## 2019-07-17 DIAGNOSIS — R110 Nausea: Secondary | ICD-10-CM | POA: Diagnosis not present

## 2019-07-17 DIAGNOSIS — K5900 Constipation, unspecified: Secondary | ICD-10-CM | POA: Diagnosis not present

## 2019-07-17 DIAGNOSIS — R1032 Left lower quadrant pain: Secondary | ICD-10-CM | POA: Insufficient documentation

## 2019-07-17 DIAGNOSIS — R1030 Lower abdominal pain, unspecified: Secondary | ICD-10-CM | POA: Diagnosis present

## 2019-07-17 LAB — COMPREHENSIVE METABOLIC PANEL
ALT: 13 U/L (ref 0–44)
AST: 13 U/L — ABNORMAL LOW (ref 15–41)
Albumin: 2.5 g/dL — ABNORMAL LOW (ref 3.5–5.0)
Alkaline Phosphatase: 42 U/L (ref 38–126)
Anion gap: 6 (ref 5–15)
BUN: 12 mg/dL (ref 6–20)
CO2: 23 mmol/L (ref 22–32)
Calcium: 8.8 mg/dL — ABNORMAL LOW (ref 8.9–10.3)
Chloride: 110 mmol/L (ref 98–111)
Creatinine, Ser: 1.11 mg/dL (ref 0.61–1.24)
GFR calc Af Amer: >60 mL/min (ref >60)
GFR calc non Af Amer: >60 mL/min (ref >60)
Glucose, Bld: 80 mg/dL (ref 70–99)
Potassium: 4 mmol/L (ref 3.5–5.1)
Sodium: 139 mmol/L (ref 135–145)
Total Bilirubin: 0.4 mg/dL (ref 0.3–1.2)
Total Protein: 5.6 g/dL — ABNORMAL LOW (ref 6.5–8.1)

## 2019-07-17 LAB — CBC
HCT: 33.2 % — ABNORMAL LOW (ref 39.0–52.0)
Hemoglobin: 10.3 g/dL — ABNORMAL LOW (ref 13.0–17.0)
MCH: 23.9 pg — ABNORMAL LOW (ref 26.0–34.0)
MCHC: 31 g/dL (ref 30.0–36.0)
MCV: 77 fL — ABNORMAL LOW (ref 80.0–100.0)
Platelets: 619 10*3/uL — ABNORMAL HIGH (ref 150–400)
RBC: 4.31 MIL/uL (ref 4.22–5.81)
RDW: 18.6 % — ABNORMAL HIGH (ref 11.5–15.5)
WBC: 4.4 10*3/uL (ref 4.0–10.5)
nRBC: 0 % (ref 0.0–0.2)

## 2019-07-17 LAB — URINALYSIS, ROUTINE W REFLEX MICROSCOPIC
Bilirubin Urine: NEGATIVE
Glucose, UA: NEGATIVE mg/dL
Hgb urine dipstick: NEGATIVE
Ketones, ur: 5 mg/dL — AB
Leukocytes,Ua: NEGATIVE
Nitrite: NEGATIVE
Protein, ur: NEGATIVE mg/dL
Specific Gravity, Urine: 1.023 (ref 1.005–1.030)
pH: 5 (ref 5.0–8.0)

## 2019-07-17 LAB — LIPASE, BLOOD: Lipase: 23 U/L (ref 11–51)

## 2019-07-17 NOTE — ED Triage Notes (Signed)
Pt w/ hx of Crohn's disease, states non compliant w/ humira injections. Reports abd pain, cramping and diarrhea x 4 days. Denies fevers.

## 2019-07-18 ENCOUNTER — Ambulatory Visit: Payer: Medicaid Other | Admitting: Internal Medicine

## 2019-07-18 MED ORDER — OXYCODONE-ACETAMINOPHEN 5-325 MG PO TABS: 1.0000 | ORAL_TABLET | Freq: Four times a day (QID) | ORAL | 0 refills | Status: DC | PRN

## 2019-07-18 MED ORDER — ONDANSETRON 4 MG PO TBDP
4.0000 mg | ORAL_TABLET | Freq: Once | ORAL | Status: AC
  Administered 2019-07-18: 4 mg via ORAL
  Filled 2019-07-18: qty 1

## 2019-07-18 MED ORDER — OXYCODONE-ACETAMINOPHEN 5-325 MG PO TABS
1.0000 | ORAL_TABLET | Freq: Once | ORAL | Status: AC
  Administered 2019-07-18: 1 via ORAL
  Filled 2019-07-18: qty 1

## 2019-07-18 MED ORDER — ONDANSETRON 4 MG PO TBDP: 4.0000 mg | ORAL_TABLET | Freq: Three times a day (TID) | ORAL | 0 refills | Status: DC | PRN

## 2019-07-18 NOTE — Discharge Instructions (Signed)
Thank you for allowing me to care for you today in the Emergency Department.   Keep your appointment tomorrow afternoon with the infectious regional disease clinic.  Take 1 tablet of Percocet every 6 hours as needed for pain.  Do not work or drive while taking this medication as it can cause you to be impaired.  Each tablet of Percocet contains 325 mg of Tylenol.  Do not take more than 4000 mg of Tylenol in a 24-hour period.  Let 1 tablet of Zofran dissolve in your tongue every 6 hours as needed.  Please follow up with Dr. Domenica Fail to restart your Humira after you have been cleared by infectious disease.  For constipation, Dulcolax is available over-the-counter.  You can also try taking MiraLAX daily and bisacodyl.  Use as directed on the label.  Return to the emergency department if you develop high fevers, uncontrollable vomiting, if you develop constipation and stop passing gas with severe abdominal pain, or if you develop other new, concerning symptoms.

## 2019-07-18 NOTE — ED Provider Notes (Signed)
[facility] EMERGENCY DEPARTMENT Provider Note   CSN: [id number] Arrival date & time: 07/17/19  1939     History Chief Complaint  Patient presents with  . Abdominal Pain    Shane Klein is a 29 y.o. male with history of Crohn's disease of the cecum and ascending colon, GERD, iron deficiency anemia, and small bowel obstruction who presents to the emergency department with a chief complaint of bilateral lower abdominal pain that began 4 days ago.  He reports that pain is constant and severe.  He characterizes the pain as cramping.  He has had associated nausea, but has had no vomiting, fever, chills, hematuria, diarrhea, or GU symptoms.  He reports that the pain feels similar to a Crohn's flare.  He also reports that he has been more constipated over the last few days.  He passed a small amount of stool earlier today.  He has been passing flatus.  He reports that his last dose of Humira was in December.  He was told that he had to stop the medication because he needed treatment for latent tuberculosis.  He states that he has an appointment at 1300 tomorrow at the regional infectious disease clinic to start treatment.  He reports that he received a call from his gastroenterologist, Dr. Domenica Fail, approximately 2 months ago telling him that he could not restart treatment until he received treatment for latent tuberculosis.  States he has been working diligently trying to start treatment over the last 2 months.  The history is provided by the patient. No language interpreter was used.       Past Medical History:  Diagnosis Date  . ANEMIA-IRON DEFICIENCY 04/02/2009  . Crohn's disease ([place]) 2010   by colon:cecum and ascending colon, by EGD: duodenum, by imaging also in ileum. No villous atrophy on duodenal biopsies.   Marland Kitchen GERD (gastroesophageal reflux disease)   . GI bleed 04/22/2013   EGD by Dr Teena Irani unremarkable  . Iron deficiency anemia   . Protein-calorie malnutrition,  severe ([place]) 04/15/2013  . SBO (small bowel obstruction) ([place]) 2013   in TI in area of active Crohn's.   . 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[facility])     Patient Active Problem List   Diagnosis Date Noted  . Abdominal pain 08/04/2014  . Thrombocytosis ([place]) 08/04/2014  . Microcytic anemia 08/04/2014  . Abdominal pain, left lower quadrant   . Crohn's colitis ([place]) 07/31/2013  . Partial small bowel obstruction ([place]) 07/31/2013  . Diarrhea 07/31/2013  . Tobacco use 07/31/2013  . Crohn's ileitis ([place]) 07/31/2013  . Pedal edema 04/24/2013  . GI bleed 04/22/2013  . Edema extremities 04/22/2013  . Protein-calorie malnutrition, severe ([place]) 04/15/2013  . Small bowel obstruction ([place]) 04/14/2013  . GERD (gastroesophageal reflux disease) 11/20/2012  . Crohn's disease of ileum ([place]) 10/05/2012  . Crohn's disease with intestinal obstruction ([place]) 03/01/2012  . Anemia 03/01/2012  . [place] INTESTINE 05/16/2009  . Iron deficiency anemia 04/02/2009  . DUODENITIS 04/02/2009  . ABDOMINAL PAIN OTHER SPECIFIED SITE 04/02/2009  . WEIGHT LOSS-ABNORMAL 01/27/2009    Past Surgical History:  Procedure Laterality Date  . COLONOSCOPY  Oct 2010   Dr. Fuller Plan: evidence of Crohn's at cecum, ascending colon, unable to intubate the terminal ileum. CTE with distal and terminal ileitis  . ESOPHAGOGASTRODUODENOSCOPY  Oct 2010   Dr. Fuller Plan: duodenitis, normal villi, likely Crohn's. Elevated Gliadin but normal TTG, IgA and IgA  . ESOPHAGOGASTRODUODENOSCOPY N/A 04/24/2013   Procedure: ESOPHAGOGASTRODUODENOSCOPY (EGD);  Surgeon:  Missy Sabins, MD;  Location: [facility];  Service: Endoscopy;  Laterality: N/A;       Family History  Problem Relation Age of Onset  . Diabetes Maternal Aunt   . Diabetes Maternal Grandmother   . Crohn's disease Maternal Aunt   . Hypertension Other   . Colon cancer Neg Hx     Social History   Tobacco Use  . Smoking status: Former Smoker    Packs/day: 0.25    Years: 15.00     Pack years: 3.75    Types: Cigarettes    Quit date: 04/26/2017    Years since quitting: 2.2  . Smokeless tobacco: Never Used  Substance Use Topics  . Alcohol use: No    Comment: occasionally  . Drug use: Yes    Types: Marijuana    Comment: occassionally    Home Medications Prior to Admission medications   Medication Sig Start Date End Date Taking? Authorizing Provider  ondansetron (ZOFRAN ODT) 4 MG disintegrating tablet Take 1 tablet (4 mg total) by mouth every 8 (eight) hours as needed. 07/18/19   Ladarrell Cornwall A, PA-C  oxyCODONE-acetaminophen (PERCOCET/ROXICET) 5-325 MG tablet Take 1 tablet by mouth every 6 (six) hours as needed for severe pain. 07/18/19   Shine Scrogham A, PA-C    Allergies    Gluten meal and Wheat bran  Review of Systems   Review of Systems  Constitutional: Negative for appetite change, chills and fever.  Respiratory: Negative for shortness of breath.   Cardiovascular: Negative for chest pain.  Gastrointestinal: Positive for abdominal pain, constipation and nausea. Negative for anal bleeding, blood in stool, diarrhea, rectal pain and vomiting.  Genitourinary: Negative for dysuria, flank pain, frequency, hematuria, penile pain and urgency.  Musculoskeletal: Negative for back pain.  Skin: Negative for rash.  Allergic/Immunologic: Negative for immunocompromised state.  Neurological: Negative for syncope, weakness and headaches.  Psychiatric/Behavioral: Negative for confusion.    Physical Exam Updated Vital Signs BP 133/84 (BP Location: Right Arm)   Pulse 61   Temp 98.1 F (36.7 C) (Oral)   Resp 16   Ht 5' 10"  (1.778 m)   Wt 83.9 kg   SpO2 100%   BMI 26.54 kg/m   Physical Exam Vitals and nursing note reviewed.  Constitutional:      Appearance: He is well-developed. He is not ill-appearing, toxic-appearing or diaphoretic.  HENT:     Head: Normocephalic.  Eyes:     Conjunctiva/sclera: Conjunctivae normal.  Cardiovascular:     Rate and Rhythm:  Normal rate and regular rhythm.     Pulses: Normal pulses.     Heart sounds: Normal heart sounds. No murmur. No friction rub. No gallop.   Pulmonary:     Effort: Pulmonary effort is normal. No respiratory distress.     Breath sounds: No stridor. No wheezing, rhonchi or rales.  Chest:     Chest wall: No tenderness.  Abdominal:     General: There is no distension.     Palpations: Abdomen is soft. There is no mass.     Tenderness: There is abdominal tenderness. There is no right CVA tenderness, left CVA tenderness, guarding or rebound.     Hernia: No hernia is present.  Musculoskeletal:     Cervical back: Neck supple.  Skin:    General: Skin is warm and dry.     Capillary Refill: Capillary refill takes less than 2 seconds.  Neurological:     Mental Status: He is alert.  Psychiatric:  Behavior: Behavior normal.     ED Results / Procedures / Treatments   Labs (all labs ordered are listed, but only abnormal results are displayed) Labs Reviewed  COMPREHENSIVE METABOLIC PANEL - Abnormal; Notable for the following components:      Result Value   Calcium 8.8 (*)    Total Protein 5.6 (*)    Albumin 2.5 (*)    AST 13 (*)    All other components within normal limits  CBC - Abnormal; Notable for the following components:   Hemoglobin 10.3 (*)    HCT 33.2 (*)    MCV 77.0 (*)    MCH 23.9 (*)    RDW 18.6 (*)    Platelets 619 (*)    All other components within normal limits  URINALYSIS, ROUTINE W REFLEX MICROSCOPIC - Abnormal; Notable for the following components:   Ketones, ur 5 (*)    All other components within normal limits  LIPASE, BLOOD    EKG None  Radiology No results found.  Procedures Procedures (including critical care time)  Medications Ordered in ED Medications  oxyCODONE-acetaminophen (PERCOCET/ROXICET) 5-325 MG per tablet 1 tablet (1 tablet Oral Given 07/18/19 0253)  ondansetron (ZOFRAN-ODT) disintegrating tablet 4 mg (4 mg Oral Given 07/18/19 0253)     ED Course  I have reviewed the triage vital signs and the nursing notes.  Pertinent labs & imaging results that were available during my care of the patient were reviewed by me and considered in my medical decision making (see chart for details).    MDM Rules/Calculators/A&P                      29 year old male with history of Crohn's disease of the cecum and ascending colon, GERD, iron deficiency anemia, and small bowel obstruction presenting with lower abdominal pain, nausea for the last 4 days.  He has also been more constipated recently, but has been passing flatus and passed a small amount of stool this morning.  He reports being off of his Humira since December after he was told that he would have to stop the treatment until he was treated for latent tuberculosis.  PMP has been reviewed and the patient has received 2 prescriptions of Percocets, most recently on February 21, over the last 3 months.  He reports the pain has been intermittent since he has been off of Humira.  He states that he is scheduled for an appointment at the regional infectious disease clinic tomorrow at 1300 to start treatment.  I did review previous x-rays and imaging of the chest in the chart did not see evidence of latent tuberculosis.  It does appear that he had testing in 2016 that was also unremarkable.  I do see that he received a call from the regional infectious disease clinic on 3/19, but it is only COVID-19 screening.  It is unusual that there are no telephone calls or notes from GI or the regional infectious disease clinic regarding coordination of late and tuberculosis treatment.  I do have some concerns for drug-seeking due to noncompliance with Humira treatments at this time.  However, given that he did have a call from the infectious disease clinic earlier this week, will treat the patient's symptoms for presumed follow-up appointment.  His labs today are reassuring.  Unchanged from previous.  He has no  peritoneal signs on exam.  He did have a CT abdomen pelvis in the ER in January that showed an active Crohn's flare, but  no evidence of bowel obstruction.  Lower suspicion for bowel obstruction today given that he has no vomiting and is passing flatus.  No evidence of sepsis.  I suspect he is also having worsening constipation given that he has had 2 prescriptions of narcotics over the last few months.  We will discharge with a short course of pain medication so the patient can keep his appointment tomorrow with infectious disease.  I have also advised him to keep close follow-up with gastroenterology.  Bowel regimen for constipation has also been given.  ER return precautions given.  He is hemodynamically stable in no acute distress.  Safe for discharge to home at this time.  Final Clinical Impression(s) / ED Diagnoses Final diagnoses:  Left lower quadrant abdominal pain  Constipation, unspecified constipation type  Nausea    Rx / DC Orders ED Discharge Orders         Ordered    oxyCODONE-acetaminophen (PERCOCET/ROXICET) 5-325 MG tablet  Every 6 hours PRN     07/18/19 0243    ondansetron (ZOFRAN ODT) 4 MG disintegrating tablet  Every 8 hours PRN     07/18/19 0243           Joline Maxcy A, PA-C 07/18/19 0256    Merryl Hacker, MD 07/18/19 [phone]

## 2019-08-08 ENCOUNTER — Other Ambulatory Visit: Payer: Self-pay | Admitting: Emergency Medicine

## 2019-08-08 ENCOUNTER — Ambulatory Visit
Admission: RE | Admit: 2019-08-08 | Discharge: 2019-08-08 | Disposition: A | Discharge status: Home / Self Care | Payer: No Typology Code available for payment source | Source: Ambulatory Visit | Attending: Obstetrics and Gynecology | Admitting: Obstetrics and Gynecology

## 2019-08-08 DIAGNOSIS — R7611 Nonspecific reaction to tuberculin skin test without active tuberculosis: Secondary | ICD-10-CM

## 2021-05-06 ENCOUNTER — Other Ambulatory Visit: Payer: Self-pay

## 2021-05-06 ENCOUNTER — Encounter (HOSPITAL_COMMUNITY): Payer: Self-pay

## 2021-05-06 ENCOUNTER — Ambulatory Visit (HOSPITAL_COMMUNITY)
Admission: EM | Admit: 2021-05-06 | Discharge: 2021-05-06 | Disposition: A | Discharge status: Home / Self Care | Payer: Medicaid Other | Attending: Family Medicine | Admitting: Family Medicine

## 2021-05-06 DIAGNOSIS — R109 Unspecified abdominal pain: Secondary | ICD-10-CM

## 2021-05-06 DIAGNOSIS — K5090 Crohn's disease, unspecified, without complications: Secondary | ICD-10-CM | POA: Diagnosis not present

## 2021-05-06 MED ORDER — PREDNISONE 10 MG (21) PO TBPK: ORAL_TABLET | Freq: Every day | ORAL | 0 refills | Status: DC

## 2021-05-06 MED ORDER — OXYCODONE-ACETAMINOPHEN 5-325 MG PO TABS: 2.0000 | ORAL_TABLET | Freq: Four times a day (QID) | ORAL | 0 refills | Status: AC | PRN

## 2021-05-06 MED ORDER — KETOROLAC TROMETHAMINE 60 MG/2ML IM SOLN
60.0000 mg | Freq: Once | INTRAMUSCULAR | Status: AC
  Administered 2021-05-06: 60 mg via INTRAMUSCULAR

## 2021-05-06 MED ORDER — KETOROLAC TROMETHAMINE 60 MG/2ML IM SOLN
INTRAMUSCULAR | Status: AC
  Filled 2021-05-06: qty 2

## 2021-05-06 NOTE — ED Provider Notes (Signed)
[facility]    CSN: [id number] Arrival date & time: 05/06/21  1049      History   Chief Complaint Chief Complaint  Patient presents with   Constipation   Crohns Flare Up    HPI Shane Klein is a 31 y.o. male.   Patient is here for crohns flare.  Started about 5 days ago with left sided stomach pain, bloating, cramping.  This is typical for his crohns.   No n/v.  No chills/fever.  His last bm was normal this morning around 2am.  He does have h/o bowel obstruction but in the past could not pass any stool with that.  He has not seen GI for his crohns in some time.   He has not been on any treatment for some time.  He took tylenol for pain without any help;  7/10 in severity;  comes/goes;   In the past he was given a prednisone taper which helped greatly.  He is requesting that at the least.   Past Medical History:  Diagnosis Date   ANEMIA-IRON DEFICIENCY 04/02/2009   Crohn's disease ([place]) 2010   by colon:cecum and ascending colon, by EGD: duodenum, by imaging also in ileum. No villous atrophy on duodenal biopsies.    GERD (gastroesophageal reflux disease)    GI bleed 04/22/2013   EGD by Dr Teena Irani unremarkable   Iron deficiency anemia    Protein-calorie malnutrition, severe ([place]) 04/15/2013   SBO (small bowel obstruction) ([place]) 2013   in TI in area of active Crohn's.    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[facility])     Patient Active Problem List   Diagnosis Date Noted   Abdominal pain 08/04/2014   Thrombocytosis 08/04/2014   Microcytic anemia 08/04/2014   Abdominal pain, left lower quadrant    Crohn's colitis ([place]) 07/31/2013   Partial small bowel obstruction ([place]) 07/31/2013   Diarrhea 07/31/2013   Tobacco use 07/31/2013   Crohn's ileitis ([place]) 07/31/2013   Pedal edema 04/24/2013   GI bleed 04/22/2013   Edema extremities 04/22/2013   Protein-calorie malnutrition, severe ([place]) 04/15/2013   Small bowel obstruction ([place]) 04/14/2013   GERD (gastroesophageal reflux disease)  11/20/2012   Crohn's disease of ileum ([place]) 10/05/2012   Crohn's disease with intestinal obstruction ([place]) 03/01/2012   Anemia 03/01/2012   CROHN'S DISEASE-LARGE & SMALL INTESTINE 05/16/2009   Iron deficiency anemia 04/02/2009   DUODENITIS 04/02/2009   ABDOMINAL PAIN OTHER SPECIFIED SITE 04/02/2009   WEIGHT LOSS-ABNORMAL 01/27/2009    Past Surgical History:  Procedure Laterality Date   COLONOSCOPY  Oct 2010   Dr. Fuller Plan: evidence of Crohn's at cecum, ascending colon, unable to intubate the terminal ileum. CTE with distal and terminal ileitis   ESOPHAGOGASTRODUODENOSCOPY  Oct 2010   Dr. Fuller Plan: duodenitis, normal villi, likely Crohn's. Elevated Gliadin but normal TTG, IgA and IgA   ESOPHAGOGASTRODUODENOSCOPY N/A 04/24/2013   Procedure: ESOPHAGOGASTRODUODENOSCOPY (EGD);  Surgeon: Missy Sabins, MD;  Location[facility] ENDOSCOPY;  Service: Endoscopy;  Laterality: N/A;       Home Medications    Prior to Admission medications   Medication Sig Start Date End Date Taking? Authorizing Provider  ondansetron (ZOFRAN ODT) 4 MG disintegrating tablet Take 1 tablet (4 mg total) by mouth every 8 (eight) hours as needed. 07/18/19   McDonald, Mia A, PA-C  oxyCODONE-acetaminophen (PERCOCET/ROXICET) 5-325 MG tablet Take 1 tablet by mouth every 6 (six) hours as needed for severe pain. 07/18/19   McDonald, Laymond Purser, PA-C    Family History Family  History  Problem Relation Age of Onset   Diabetes Maternal Aunt    Diabetes Maternal Grandmother    Crohn's disease Maternal Aunt    Hypertension Other    Colon cancer Neg Hx     Social History Social History   Tobacco Use   Smoking status: Former    Packs/day: 0.25    Years: 15.00    Pack years: 3.75    Types: Cigarettes    Quit date: 04/26/2017    Years since quitting: 4.0   Smokeless tobacco: Never  Substance Use Topics   Alcohol use: No    Comment: occasionally   Drug use: Yes    Types: Marijuana    Comment: occassionally     Allergies   Gluten  meal and Wheat bran   Review of Systems Review of Systems  Constitutional:  Positive for activity change. Negative for chills and fever.  HENT: Negative.    Respiratory: Negative.    Cardiovascular: Negative.   Gastrointestinal:  Positive for abdominal distention and constipation. Negative for diarrhea, nausea and vomiting.  Genitourinary: Negative.     Physical Exam Triage Vital Signs ED Triage Vitals [05/06/21 1152]  Enc Vitals Group     BP 106/69     Pulse Rate 81     Resp      Temp 98.2 F (36.8 C)     Temp Source Oral     SpO2 94 %     Weight      Height      Head Circumference      Peak Flow      Pain Score 7     Pain Loc      Pain Edu?      Excl. in [place]?    No data found.  Updated Vital Signs BP 106/69 (BP Location: Right Arm)    Pulse 81    Temp 98.2 F (36.8 C) (Oral)    SpO2 94%   Visual Acuity Right Eye Distance:   Left Eye Distance:   Bilateral Distance:    Right Eye Near:   Left Eye Near:    Bilateral Near:     Physical Exam Constitutional:      General: He is not in acute distress.    Appearance: Normal appearance. He is not ill-appearing.     Comments: Patient does appear uncomfortable   HENT:     Head: Normocephalic and atraumatic.  Cardiovascular:     Rate and Rhythm: Normal rate and regular rhythm.  Pulmonary:     Effort: Pulmonary effort is normal.     Breath sounds: Normal breath sounds.  Abdominal:     General: Bowel sounds are normal.     Tenderness: There is abdominal tenderness.     Comments: Patient has generalized tenderness throughout.  He is more tender at the left mid/lower abdomen and lower mid abdomen;    Musculoskeletal:     Cervical back: Normal range of motion and neck supple.  Neurological:     Mental Status: He is alert.     UC Treatments / Results  Labs (all labs ordered are listed, but only abnormal results are displayed) Labs Reviewed - No data to display  EKG   Radiology No results  found.  Procedures Procedures (including critical care time)  Medications Ordered in UC Medications  ketorolac (TORADOL) injection 60 mg (has no administration in time range)    Initial Impression / Assessment and Plan / UC Course  I have  reviewed the triage vital signs and the nursing notes.  Pertinent labs & imaging results that were available during my care of the patient were reviewed by me and considered in my medical decision making (see chart for details).   Patient was seen today for abdominal pain due to crohns flare.  These are his typical symptoms.  He has not seen GI or had treatment for his crohns in some time.  He does not feel that he is obstructed as he had a BM this morning.  He is reluctant to go to the ER.  He is requesting prednisone, which usually helps, and pain medication if possible.  Prednisone taper and 2 days of pain medications sent to the pharmacy.  If he develops worsening pain, n/v/constipation and unable to pass a BM he is instructed to go the ER for evaluation.  He is aware and agrees.   Final Clinical Impressions(s) / UC Diagnoses   Final diagnoses:  Crohn's disease without complication, unspecified gastrointestinal tract location [facility])  Abdominal pain, unspecified abdominal location     Discharge Instructions      You were seen today for abdominal pain due to crohns flare.  Since you do not appear to have a obstruction we will treat with prednisone and pain medication for several days.  However, if your pain worsens or you feel you feel that you have developed an obstruction (stop having bowel movements) then please go to the ER as soon as possible.   You should also attempt to make an appointment with a gastroenterologist for further care.     ED Prescriptions     Medication Sig Dispense Auth. Provider   predniSONE (STERAPRED UNI-PAK 21 TAB) 10 MG (21) TBPK tablet Take by mouth daily. Take 6 tabs by mouth daily  for 2 days, then 5 tabs for 2  days, then 4 tabs for 2 days, then 3 tabs for 2 days, 2 tabs for 2 days, then 1 tab by mouth daily for 2 days 42 tablet Asyah Candler, MD   oxyCODONE-acetaminophen (PERCOCET/ROXICET) 5-325 MG tablet Take 2 tablets by mouth every 6 (six) hours as needed for up to 2 days for severe pain. 10 tablet Rondel Oh, MD      PDMP not reviewed this encounter. PMP was reviewed today for narcotic use.  Last fill was 06/2019 per report.    Rondel Oh, MD 05/06/21 1217

## 2021-05-06 NOTE — Discharge Instructions (Addendum)
You were seen today for abdominal pain due to crohns flare.  Since you do not appear to have a obstruction we will treat with prednisone and pain medication for several days.  However, if your pain worsens or you feel you feel that you have developed an obstruction (stop having bowel movements) then please go to the ER as soon as possible.   You should also attempt to make an appointment with a gastroenterologist for further care.

## 2021-05-06 NOTE — ED Triage Notes (Signed)
Pt presents with abdominal pain, bloating, and constipation in relation to a crohns flare up X 1 week

## 2021-05-20 ENCOUNTER — Other Ambulatory Visit: Payer: Self-pay

## 2021-05-20 ENCOUNTER — Encounter (HOSPITAL_COMMUNITY): Payer: Self-pay | Admitting: Emergency Medicine

## 2021-05-20 ENCOUNTER — Ambulatory Visit (HOSPITAL_COMMUNITY)
Admission: EM | Admit: 2021-05-20 | Discharge: 2021-05-20 | Disposition: A | Discharge status: Home / Self Care | Payer: Medicaid Other | Attending: Emergency Medicine | Admitting: Emergency Medicine

## 2021-05-20 DIAGNOSIS — R1030 Lower abdominal pain, unspecified: Secondary | ICD-10-CM

## 2021-05-20 DIAGNOSIS — K50118 Crohn's disease of large intestine with other complication: Secondary | ICD-10-CM

## 2021-05-20 MED ORDER — PREDNISONE 10 MG PO TABS: 40.0000 mg | ORAL_TABLET | Freq: Every day | ORAL | 0 refills | Status: DC

## 2021-05-20 NOTE — ED Provider Notes (Signed)
[place]    CSN: [id number] Arrival date & time: 05/20/21  6440      History   Chief Complaint Chief Complaint  Patient presents with   Abdominal Pain    HPI Shane Klein is a 31 y.o. male.   Patient is here for chronic abdominal pain and Crohn's disease flareup.  Patient was just seen January 11 for the same pain.  Patient was given prednisone and he states that it did get better then 3 days later began to have a flareup again.  Patient states that he does have a GI doctor has not seen them.  He was taking Humira but it was too expensive to continue.  Patient denies any rectal bleeding his last bowel movement was today and normal.  He has had a bowel obstruction in the past and knows the symptoms does not currently have this.  Denies any nausea vomiting or diarrhea.  Denies any fever.  He is asking for a 30-day supply of prednisone which is what he has taken in the past to help with this discomfort.  Patient states he does not want to go to the emergency room.   Past Medical History:  Diagnosis Date   ANEMIA-IRON DEFICIENCY 04/02/2009   Crohn's disease ([place]) 2010   by colon:cecum and ascending colon, by EGD: duodenum, by imaging also in ileum. No villous atrophy on duodenal biopsies.    GERD (gastroesophageal reflux disease)    GI bleed 04/22/2013   EGD by Dr Teena Irani unremarkable   Iron deficiency anemia    Protein-calorie malnutrition, severe ([place]) 04/15/2013   SBO (small bowel obstruction) ([place]) 2013   in [place] in area of active Crohn's.    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   Pedal edema 04/24/2013   GI bleed 04/22/2013    Edema extremities 04/22/2013   Protein-calorie malnutrition, severe ([place]) 04/15/2013   Small bowel obstruction ([place]) 04/14/2013   GERD (gastroesophageal reflux disease) 11/20/2012   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 Fuller Plan: evidence of Crohn's at cecum, ascending colon, unable to intubate the terminal ileum. CTE with distal and terminal ileitis   ESOPHAGOGASTRODUODENOSCOPY  Oct 2010   Dr. Fuller Plan: duodenitis, normal villi, likely Crohn's. Elevated Gliadin but normal TTG, IgA and IgA   ESOPHAGOGASTRODUODENOSCOPY N/A 04/24/2013   Procedure: ESOPHAGOGASTRODUODENOSCOPY (EGD);  Surgeon: Missy Sabins, MD;  Location: [facility] ENDOSCOPY;  Service: Endoscopy;  Laterality: N/A;       Home Medications    Prior to Admission medications   Medication Sig Start Date End Date Taking? Authorizing Provider  predniSONE (DELTASONE) 10 MG tablet Take 4 tablets (40 mg total) by mouth daily. 05/20/21  Yes Marney Setting, NP    Family History Family History  Problem Relation Age of Onset  Diabetes Maternal Aunt    Diabetes Maternal Grandmother    Crohn's disease Maternal Aunt    Hypertension Other    Colon cancer Neg Hx     Social History Social History   Tobacco Use   Smoking status: Former    Packs/day: 0.25    Years: 15.00    Pack years: 3.75    Types: Cigarettes    Quit date: 04/26/2017    Years since quitting: 4.0   Smokeless tobacco: Never  Substance Use Topics   Alcohol use: No    Comment: occasionally   Drug use: Yes    Types: Marijuana    Comment: occassionally     Allergies   Gluten meal and Wheat  bran   Review of Systems Review of Systems  Constitutional:  Negative for fever.  Respiratory: Negative.    Cardiovascular: Negative.   Gastrointestinal:  Positive for abdominal distention and abdominal pain. Negative for anal bleeding, blood in stool, constipation, diarrhea, nausea, rectal pain and vomiting.  Genitourinary: Negative.   Skin: Negative.   Neurological: Negative.     Physical Exam Triage Vital Signs ED Triage Vitals  Enc Vitals Group     BP 05/20/21 1151 110/73     Pulse Rate 05/20/21 1151 82     Resp 05/20/21 1151 16     Temp 05/20/21 1151 99.3 F (37.4 C)     Temp Source 05/20/21 1151 Oral     SpO2 05/20/21 1151 96 %     Weight --      Height --      Head Circumference --      Peak Flow --      Pain Score 05/20/21 1150 7     Pain Loc --      Pain Edu? --      Excl. in [place]? --    No data found.  Updated Vital Signs BP 110/73    Pulse 82    Temp 99.3 F (37.4 C) (Oral)    Resp 16    SpO2 96%   Visual Acuity Right Eye Distance:   Left Eye Distance:   Bilateral Distance:    Right Eye Near:   Left Eye Near:    Bilateral Near:     Physical Exam Constitutional:      Appearance: He is well-developed.  Cardiovascular:     Rate and Rhythm: Normal rate.  Pulmonary:     Effort: Pulmonary effort is normal.     Breath sounds: Normal breath sounds.  Abdominal:     General: Bowel sounds are increased. There is distension. There is no abdominal bruit. There are no signs of injury.     Palpations: Abdomen is soft.     Tenderness: There is abdominal tenderness in the right lower quadrant and left lower quadrant. There is no right CVA tenderness or left CVA tenderness.  Skin:    General: Skin is warm.  Neurological:     General: No focal deficit present.     Mental Status: He is alert.     UC Treatments / Results  Labs (all labs ordered are listed, but only abnormal results are displayed) Labs Reviewed - No data to display  EKG   Radiology No  results found.  Procedures Procedures (including critical care time)  Medications Ordered in UC Medications - No data to display  Initial Impression / Assessment and Plan / UC Course  I have reviewed the triage vital signs and the nursing notes.  Pertinent labs & imaging results that were available during my care of the patient were reviewed by me and considered in my medical decision making (see chart for details).     When talking with patient he was argumentative and did not want to be seen in the emergency room states that he has a GI he just has followed up with them. Patient states that he just wants prednisone and they will make it better Patient is not wanting further testing or to be seen in the emergency room at this time Educated patient the risk of not having this treated effectively in the risk of bowel obstructions.  Patient states he knows the symptoms he has had this in the past I recommend for patient to be seen in the emergency room for further testing Reviewed previous chart and history  Final Clinical Impressions(s) / UC Diagnoses   Final diagnoses:  Lower abdominal pain  Crohn's colitis, other complication [facility])     Discharge Instructions      Follow-up with GI as soon as possible You have been seen several times for the same pain if symptoms become worse you will need to be seen in the emergency room for further testing and evaluation.     ED Prescriptions     Medication Sig Dispense Auth. Provider   predniSONE (DELTASONE) 10 MG tablet Take 4 tablets (40 mg total) by mouth daily. 30 tablet Marney Setting, NP      PDMP not reviewed this encounter.   Marney Setting, NP 05/20/21 1220

## 2021-05-20 NOTE — ED Triage Notes (Signed)
PT reports lower abdominal pain, joint pain, and back pain for 2 days. Believes he is having crohns flare up. Has not seen gastro in a year or so.   Was here for same symptoms 1/11

## 2021-05-20 NOTE — Discharge Instructions (Addendum)
Follow-up with GI as soon as possible You have been seen several times for the same pain if symptoms become worse you will need to be seen in the emergency room for further testing and evaluation.

## 2021-09-01 ENCOUNTER — Ambulatory Visit (HOSPITAL_COMMUNITY)
Admission: EM | Admit: 2021-09-01 | Discharge: 2021-09-01 | Disposition: A | Discharge status: Home / Self Care | Payer: Medicaid Other | Attending: Emergency Medicine | Admitting: Emergency Medicine

## 2021-09-01 ENCOUNTER — Encounter (HOSPITAL_COMMUNITY): Payer: Self-pay

## 2021-09-01 DIAGNOSIS — R1030 Lower abdominal pain, unspecified: Secondary | ICD-10-CM | POA: Diagnosis not present

## 2021-09-01 MED ORDER — PREDNISONE 10 MG (21) PO TBPK: ORAL_TABLET | Freq: Every day | ORAL | 0 refills | Status: DC

## 2021-09-01 NOTE — Discharge Instructions (Signed)
Today you are being treated for lower abdominal pain and a possible Crohn's flare ? ?Begin use of prednisone every morning with food as directed on packaging  ? ?Eat a bland diet consisting of broth rice, fruit and bread, may advance her diet as tolerated ? ?Please schedule an appointment with your GI specialist for reevaluation of your Crohn's as she has had several flareups over of this year ? ?At any point if you begin to have severe abdominal pain please go to the nearest emergency department for further evaluation and management ? ?

## 2021-09-01 NOTE — ED Triage Notes (Signed)
Onset last night of crohn's flare with abdominal pain and bloating. No meds taken.  ?

## 2021-09-01 NOTE — ED Notes (Signed)
Pt not in lobby when called, called on phone and states in car. Waiting for patient to come in for 6+ minutes and not come in  ?

## 2021-09-01 NOTE — ED Provider Notes (Signed)
?[place] ? ? ? ?CSN: [id number] ?Arrival date & time: 09/01/21  1026 ? ? ?  ? ?History   ?Chief Complaint ?Chief Complaint  ?Patient presents with  ? Abdominal Pain  ? ? ?HPI ?Shane Klein is a 31 y.o. male.  ? ?Patient presents with generalized abdominal pain for 1 day. Associated bloating. Described cramping and aching, rating 8/10. LBM today, described as normal.  Has not attempted to eat, tolerating fluids. No known sick contacts.   Denies nausea, vomiting, diarrhea, fever, chills, URI symptoms, heartburn or indigestion, increased gas production, urinary symptoms.  History of Crohn's disease.  Last colonoscopy 3 years ago, negative. ? ? ? ?Past Medical History:  ?Diagnosis Date  ? ANEMIA-IRON DEFICIENCY 04/02/2009  ? Crohn's disease ([place]) 2010  ? by colon:cecum and ascending colon, by EGD: duodenum, by imaging also in ileum. No villous atrophy on duodenal biopsies.   ? GERD (gastroesophageal reflux disease)   ? GI bleed 04/22/2013  ? EGD by Dr Teena Irani unremarkable  ? Iron deficiency anemia   ? Protein-calorie malnutrition, severe ( [place]) 04/15/2013  ? SBO (small bowel obstruction) ([place]) 2013  ? in [place] in area of active Crohn's.   ? 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([place])   ? ? ?Patient Active Problem List  ? Diagnosis Date Noted  ? Abdominal pain 08/04/2014  ? Thrombocytosis 08/04/2014  ? Microcytic anemia 08/04/2014  ? Abdominal pain, left lower quadrant   ? Crohn's colitis ([place]) 07/31/2013  ? Partial small bowel obstruction ([place]) 07/31/2013  ? Diarrhea 07/31/2013  ? Tobacco use 07/31/2013  ? 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 Pedal edema 04/24/2013  ? GI bleed 04/22/2013  ? Edema extremities 04/22/2013  ? Protein-calorie malnutrition, severe ([place]) 04/15/2013  ? Small bowel obstruction ([place]) 04/14/2013  ? GERD (gastroesophageal reflux disease) 11/20/2012  ? Crohn's disease of ileum ([place]) 10/05/2012  ? Crohn's disease with intestinal obstruction ([place]) 03/01/2012  ? Anemia 03/01/2012  ? [place]  INTESTINE 05/16/2009  ? Iron deficiency anemia 04/02/2009  ? DUODENITIS 04/02/2009  ? ABDOMINAL PAIN OTHER SPECIFIED SITE 04/02/2009  ? WEIGHT LOSS-ABNORMAL 01/27/2009  ? ? ?Past Surgical History:  ?Procedure Laterality Date  ? COLONOSCOPY  Oct 2010  ? Dr. Fuller Plan: evidence of Crohn's at cecum, ascending colon, unable to intubate the terminal ileum. CTE with distal and terminal ileitis  ? ESOPHAGOGASTRODUODENOSCOPY  Oct 2010  ? Dr. Fuller Plan: duodenitis, normal villi, likely Crohn's. Elevated Gliadin but normal TTG, IgA and IgA  ? ESOPHAGOGASTRODUODENOSCOPY N/A 04/24/2013  ? Procedure: ESOPHAGOGASTRODUODENOSCOPY (EGD);  Surgeon: Missy Sabins, MD;  Location: [facility] ENDOSCOPY;  Service: Endoscopy;  Laterality: N/A;  ? ? ? ? ? ?Home Medications   ? ?Prior to Admission medications   ?Medication Sig Start Date End Date Taking? Authorizing Provider  ?predniSONE (DELTASONE) 10 MG tablet Take 4 tablets (40 mg total) by mouth daily. 05/20/21   Marney Setting, NP  ? ? ?Family History ?Family History  ?Problem Relation Age of Onset  ? Diabetes Maternal Aunt   ? Diabetes Maternal Grandmother   ? Crohn's disease Maternal Aunt   ? Hypertension Other   ? Colon cancer Neg Hx   ? ? ?Social History ?Social History  ? ?Tobacco Use  ? Smoking status: Former  ?  Packs/day: 0.25  ?  Years: 15.00  ?  Pack years: 3.75  ?  Types: Cigarettes  ?  Quit date: 04/26/2017  ?  Years since quitting: 4.3  ? Smokeless tobacco: Never  ?Substance  Use Topics  ? Alcohol use: No  ?  Comment: occasionally  ? Drug use: Yes  ?  Types: Marijuana  ?  Comment: occassionally  ? ? ? ?Allergies   ?Gluten meal and Wheat bran ? ? ?Review of Systems ?Review of Systems  ?Constitutional: Negative.   ?HENT: Negative.    ?Respiratory: Negative.    ?Cardiovascular: Negative.   ?Gastrointestinal:  Positive for abdominal pain. Negative for abdominal distention, anal bleeding, blood in stool, constipation, diarrhea, nausea, rectal pain and vomiting.  ?Skin: Negative.    ?Neurological: Negative.   ? ? ?Physical Exam ?Triage Vital Signs ?ED Triage Vitals [09/01/21 1131]  ?Enc Vitals Group  ?   BP 108/70  ?   Pulse Rate 83  ?   Resp 17  ?   Temp 98.3 ?F (36.8 ?C)  ?   Temp Source Oral  ?   SpO2 100 %  ?   Weight   ?   Height   ?   Head Circumference   ?   Peak Flow   ?   Pain Score 7  ?   Pain Loc   ?   Pain Edu?   ?   Excl. in [place]?   ? ?No data found. ? ?Updated Vital Signs ?BP 108/70 (BP Location: Left Arm)   Pulse 83   Temp 98.3 ?F (36.8 ?C) (Oral)   Resp 17   SpO2 100%  ? ?Visual Acuity ?Right Eye Distance:   ?Left Eye Distance:   ?Bilateral Distance:   ? ?Right Eye Near:   ?Left Eye Near:    ?Bilateral Near:    ? ?Physical Exam ?Constitutional:   ?   Appearance: He is well-developed.  ?HENT:  ?   Head: Normocephalic.  ?Pulmonary:  ?   Effort: Pulmonary effort is normal.  ?Abdominal:  ?   General: Abdomen is flat. Bowel sounds are increased.  ?   Palpations: Abdomen is soft.  ?   Tenderness: There is generalized abdominal tenderness.  ?Skin: ?   General: Skin is warm and dry.  ?Neurological:  ?   General: No focal deficit present.  ?   Mental Status: He is alert and oriented to person, place, and time.  ?Psychiatric:     ?   Mood and Affect: Mood normal.     ?   Behavior: Behavior normal.  ? ? ? ?UC Treatments / Results  ?Labs ?(all labs ordered are listed, but only abnormal results are displayed) ?Labs Reviewed - No data to display ? ?EKG ? ? ?Radiology ?No results found. ? ?Procedures ?Procedures (including critical care time) ? ?Medications Ordered in UC ?Medications - No data to display ? ?Initial Impression / Assessment and Plan / UC Course  ?I have reviewed the triage vital signs and the nursing notes. ? ?Pertinent labs & imaging results that were available during my care of the patient were reviewed by me and considered in my medical decision making (see chart for details). ? ?Lower abdominal pain ? ?Vital signs are stable, patient is in no signs of distress, low  suspicion for acute abdomen, will move forward with a prednisone taper as he has had success in the past with this treatment, recommended bland diet to prevent further stomach irritation, advised patient to follow-up with GI specialist for reevaluation of Crohn's disease, given strict precautions for worsening symptoms to go to the nearest hospital, note given for Lawyer ?Final Clinical Impressions(s) / UC Diagnoses  ? ?Final diagnoses:  ?  None  ? ?Discharge Instructions   ?None ?  ? ?ED Prescriptions   ?None ?  ? ?PDMP not reviewed this encounter. ?  ?Hans [place], NP ?09/01/21 1245 ? ?

## 2021-10-05 ENCOUNTER — Other Ambulatory Visit: Payer: Self-pay

## 2021-10-05 ENCOUNTER — Encounter (HOSPITAL_COMMUNITY): Payer: Self-pay

## 2021-10-05 ENCOUNTER — Ambulatory Visit (HOSPITAL_COMMUNITY)
Admission: EM | Admit: 2021-10-05 | Discharge: 2021-10-05 | Disposition: A | Discharge status: Home / Self Care | Payer: Medicaid Other | Attending: Physician Assistant | Admitting: Physician Assistant

## 2021-10-05 ENCOUNTER — Emergency Department (HOSPITAL_COMMUNITY): Payer: Medicaid Other

## 2021-10-05 ENCOUNTER — Ambulatory Visit (INDEPENDENT_AMBULATORY_CARE_PROVIDER_SITE_OTHER): Payer: Medicaid Other

## 2021-10-05 ENCOUNTER — Encounter (HOSPITAL_COMMUNITY): Payer: Self-pay | Admitting: Emergency Medicine

## 2021-10-05 ENCOUNTER — Inpatient Hospital Stay (HOSPITAL_COMMUNITY)
Admission: EM | Admit: 2021-10-05 | Discharge: 2021-10-08 | DRG: 386 | Disposition: A | Discharge status: Home / Self Care | Payer: Medicaid Other | Attending: Internal Medicine | Admitting: Internal Medicine

## 2021-10-05 DIAGNOSIS — Z91018 Allergy to other foods: Secondary | ICD-10-CM

## 2021-10-05 DIAGNOSIS — R188 Other ascites: Secondary | ICD-10-CM | POA: Diagnosis present

## 2021-10-05 DIAGNOSIS — E440 Moderate protein-calorie malnutrition: Secondary | ICD-10-CM | POA: Diagnosis present

## 2021-10-05 DIAGNOSIS — R109 Unspecified abdominal pain: Secondary | ICD-10-CM | POA: Diagnosis not present

## 2021-10-05 DIAGNOSIS — K50812 Crohn's disease of both small and large intestine with intestinal obstruction: Principal | ICD-10-CM | POA: Diagnosis present

## 2021-10-05 DIAGNOSIS — K50819 Crohn's disease of both small and large intestine with unspecified complications: Secondary | ICD-10-CM

## 2021-10-05 DIAGNOSIS — E538 Deficiency of other specified B group vitamins: Secondary | ICD-10-CM | POA: Diagnosis present

## 2021-10-05 DIAGNOSIS — R1030 Lower abdominal pain, unspecified: Secondary | ICD-10-CM

## 2021-10-05 DIAGNOSIS — K660 Peritoneal adhesions (postprocedural) (postinfection): Secondary | ICD-10-CM | POA: Diagnosis present

## 2021-10-05 DIAGNOSIS — R935 Abnormal findings on diagnostic imaging of other abdominal regions, including retroperitoneum: Secondary | ICD-10-CM

## 2021-10-05 DIAGNOSIS — K50112 Crohn's disease of large intestine with intestinal obstruction: Secondary | ICD-10-CM

## 2021-10-05 DIAGNOSIS — Z8719 Personal history of other diseases of the digestive system: Secondary | ICD-10-CM

## 2021-10-05 DIAGNOSIS — Z8379 Family history of other diseases of the digestive system: Secondary | ICD-10-CM

## 2021-10-05 DIAGNOSIS — K56609 Unspecified intestinal obstruction, unspecified as to partial versus complete obstruction: Principal | ICD-10-CM

## 2021-10-05 DIAGNOSIS — Z6820 Body mass index (BMI) 20.0-20.9, adult: Secondary | ICD-10-CM

## 2021-10-05 DIAGNOSIS — K219 Gastro-esophageal reflux disease without esophagitis: Secondary | ICD-10-CM | POA: Diagnosis present

## 2021-10-05 DIAGNOSIS — R110 Nausea: Secondary | ICD-10-CM

## 2021-10-05 DIAGNOSIS — D509 Iron deficiency anemia, unspecified: Secondary | ICD-10-CM | POA: Diagnosis present

## 2021-10-05 DIAGNOSIS — K50012 Crohn's disease of small intestine with intestinal obstruction: Secondary | ICD-10-CM | POA: Diagnosis present

## 2021-10-05 DIAGNOSIS — Z87891 Personal history of nicotine dependence: Secondary | ICD-10-CM

## 2021-10-05 DIAGNOSIS — K5080 Crohn's disease of both small and large intestine without complications: Secondary | ICD-10-CM | POA: Diagnosis present

## 2021-10-05 LAB — URINALYSIS, ROUTINE W REFLEX MICROSCOPIC
Bilirubin Urine: NEGATIVE
Glucose, UA: NEGATIVE mg/dL
Hgb urine dipstick: NEGATIVE
Ketones, ur: NEGATIVE mg/dL
Leukocytes,Ua: NEGATIVE
Nitrite: NEGATIVE
Protein, ur: NEGATIVE mg/dL
Specific Gravity, Urine: 1.015 (ref 1.005–1.030)
pH: 5 (ref 5.0–8.0)

## 2021-10-05 LAB — COMPREHENSIVE METABOLIC PANEL
ALT: 7 U/L (ref 0–44)
AST: 7 U/L — ABNORMAL LOW (ref 15–41)
Albumin: 2.2 g/dL — ABNORMAL LOW (ref 3.5–5.0)
Alkaline Phosphatase: 41 U/L (ref 38–126)
Anion gap: 6 (ref 5–15)
BUN: 15 mg/dL (ref 6–20)
CO2: 25 mmol/L (ref 22–32)
Calcium: 8.4 mg/dL — ABNORMAL LOW (ref 8.9–10.3)
Chloride: 106 mmol/L (ref 98–111)
Creatinine, Ser: 1.07 mg/dL (ref 0.61–1.24)
GFR, Estimated: >60 mL/min (ref >60)
Glucose, Bld: 96 mg/dL (ref 70–99)
Potassium: 3.9 mmol/L (ref 3.5–5.1)
Sodium: 137 mmol/L (ref 135–145)
Total Bilirubin: 0.4 mg/dL (ref 0.3–1.2)
Total Protein: 6.3 g/dL — ABNORMAL LOW (ref 6.5–8.1)

## 2021-10-05 LAB — CBC
HCT: 27 % — ABNORMAL LOW (ref 39.0–52.0)
Hemoglobin: 8.3 g/dL — ABNORMAL LOW (ref 13.0–17.0)
MCH: 23.6 pg — ABNORMAL LOW (ref 26.0–34.0)
MCHC: 30.7 g/dL (ref 30.0–36.0)
MCV: 76.9 fL — ABNORMAL LOW (ref 80.0–100.0)
Platelets: 548 10*3/uL — ABNORMAL HIGH (ref 150–400)
RBC: 3.51 MIL/uL — ABNORMAL LOW (ref 4.22–5.81)
RDW: 21.4 % — ABNORMAL HIGH (ref 11.5–15.5)
WBC: 8 10*3/uL (ref 4.0–10.5)
nRBC: 0 % (ref 0.0–0.2)

## 2021-10-05 LAB — LIPASE, BLOOD: Lipase: 33 U/L (ref 11–51)

## 2021-10-05 MED ORDER — IOHEXOL 300 MG/ML  SOLN
100.0000 mL | Freq: Once | INTRAMUSCULAR | Status: AC | PRN
  Administered 2021-10-05: 100 mL via INTRAVENOUS

## 2021-10-05 MED ORDER — ONDANSETRON 4 MG PO TBDP
4.0000 mg | ORAL_TABLET | Freq: Once | ORAL | Status: AC
  Administered 2021-10-05: 4 mg via ORAL

## 2021-10-05 MED ORDER — ONDANSETRON 4 MG PO TBDP
ORAL_TABLET | ORAL | Status: AC
  Filled 2021-10-05: qty 1

## 2021-10-05 NOTE — ED Provider Notes (Signed)
[name]    CSN: [id number] Arrival date & time: 10/05/21  1540      History   Chief Complaint Chief Complaint  Patient presents with   Abdominal Pain   Constipation    HPI Shane Klein is a 31 y.o. male.   Patient presents today with a several day history of Crohn's disease flare.  He is followed by gastroenterology but has not seen them recently and is not on any disease modifying medication.  He was last seen by our clinic in May 2023 at which point he was given prednisone with temporary relief of symptoms.  Reports that current episode began approximately 2 days ago as he is experiencing lower abdominal pain, change in bowel habit, nausea.  Denies any obstipation, difficulty passing stool, focal abdominal pain, vomiting.  He denies previous abdominal surgery including for Crohn's disease.  He reports pain is rated 8/9 due to pain scale, described as cramping, worse in lower abdomen, no aggravating or alleviating factors identified.  He is not taking any over-the-counter medication for symptom management.  Denies any medication changes, known sick contacts, suspicious food intake, recent travel.  He is having difficulty with daily activities as a result of symptoms.    Past Medical History:  Diagnosis Date   ANEMIA-IRON DEFICIENCY 04/02/2009   Crohn's disease ([place]) 2010   by colon:cecum and ascending colon, by EGD: duodenum, by imaging also in ileum. No villous atrophy on duodenal biopsies.    GERD (gastroesophageal reflux disease)    GI bleed 04/22/2013   EGD by Dr Teena Irani unremarkable   Iron deficiency anemia    Protein-calorie malnutrition, severe ([place]) 04/15/2013   SBO (small bowel obstruction) ([place]) 2013   [place] in [place] of [place] Crohn's.    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/31/2013   Pedal edema 04/24/2013   GI bleed 04/22/2013   Edema extremities 04/22/2013   Protein-calorie malnutrition, severe ([place]) 04/15/2013   Small bowel obstruction ([place]) 04/14/2013   GERD (gastroesophageal reflux disease) 11/20/2012   Crohn's disease of ileum ([place]) 10/05/2012   Crohn's disease with intestinal obstruction ([place]) 03/01/2012   Anemia 03/01/2012   CROHN'S DISEASE-LARGE & SMALL INTESTINE 05/16/2009   Iron deficiency anemia 04/02/2009   DUODENITIS 04/02/2009   ABDOMINAL PAIN OTHER SPECIFIED SITE 04/02/2009   WEIGHT LOSS-ABNORMAL 01/27/2009    Past Surgical History:  Procedure Laterality Date   COLONOSCOPY  Oct 2010   Dr. Fuller Plan: evidence of Crohn's at cecum, ascending colon, unable to intubate the terminal ileum. CTE with distal and terminal ileitis   ESOPHAGOGASTRODUODENOSCOPY  Oct 2010   Dr. Fuller Plan: duodenitis, normal villi, likely Crohn's. Elevated Gliadin but normal TTG, IgA and IgA   ESOPHAGOGASTRODUODENOSCOPY N/A 04/24/2013   Procedure: ESOPHAGOGASTRODUODENOSCOPY (EGD);  Surgeon: Missy Sabins, MD;  Location: [facility] ENDOSCOPY;  Service: Endoscopy;  Laterality: N/A;       Home Medications    Prior to Admission medications   Not on File    Family History Family History  Problem Relation Age of Onset   Diabetes Maternal Aunt    Diabetes Maternal Grandmother    Crohn's disease Maternal Aunt  Hypertension Other    Colon cancer Neg Hx     Social History Social History   Tobacco Use   Smoking status: Former    Packs/day: 0.25    Years: 15.00    Total pack years: 3.75    Types: Cigarettes    Quit date: 04/26/2017    Years since quitting: 4.4   Smokeless tobacco: Never  Substance Use Topics   Alcohol use: No    Comment: occasionally   Drug use: Yes    Types: Marijuana    Comment:  occassionally     Allergies   Gluten meal and Wheat bran   Review of Systems Review of Systems  Constitutional:  Positive for activity change and appetite change. Negative for fatigue and fever.  Respiratory:  Negative for cough and shortness of breath.   Cardiovascular:  Negative for chest pain.  Gastrointestinal:  Positive for abdominal pain, constipation and nausea. Negative for blood in stool, diarrhea and vomiting.  Neurological:  Negative for dizziness, light-headedness and headaches.     Physical Exam Triage Vital Signs ED Triage Vitals  Enc Vitals Group     BP 10/05/21 1736 119/72     Pulse Rate 10/05/21 1736 87     Resp --      Temp 10/05/21 1736 98.3 F (36.8 C)     Temp Source 10/05/21 1736 Oral     SpO2 10/05/21 1736 97 %     Weight --      Height --      Head Circumference --      Peak Flow --      Pain Score 10/05/21 1735 10     Pain Loc --      Pain Edu? --      Excl. in [place]? --    No data found.  Updated Vital Signs BP 119/72 (BP Location: Right Arm)   Pulse 87   Temp 98.3 F (36.8 C) (Oral)   SpO2 97%   Visual Acuity Right Eye Distance:   Left Eye Distance:   Bilateral Distance:    Right Eye Near:   Left Eye Near:    Bilateral Near:     Physical Exam Vitals reviewed.  Constitutional:      General: He is awake.     Appearance: Normal appearance. He is well-developed. He is not ill-appearing.     Comments: Very pleasant male appears stated age laying on exam room table in no acute distress  HENT:     Head: Normocephalic and atraumatic.     Mouth/Throat:     Pharynx: No oropharyngeal exudate, posterior oropharyngeal erythema or uvula swelling.  Cardiovascular:     Rate and Rhythm: Normal rate and regular rhythm.     Heart sounds: Normal heart sounds, S1 normal and S2 normal. No murmur heard. Pulmonary:     Effort: Pulmonary effort is normal.     Breath sounds: Normal breath sounds. No stridor. No wheezing, rhonchi or rales.      Comments: Clear to auscultation bilaterally Abdominal:     General: Bowel sounds are normal.     Palpations: Abdomen is soft.     Tenderness: There is generalized abdominal tenderness. There is no right CVA tenderness, left CVA tenderness, guarding or rebound.     Comments: Tenderness palpation throughout abdomen.  No evidence of acute abdomen on physical exam.  Neurological:     Mental Status: He is alert.  Psychiatric:        Behavior:  Behavior is cooperative.      UC Treatments / Results  Labs (all labs ordered are listed, but only abnormal results are displayed) Labs Reviewed - No data to display  EKG   Radiology DG Abdomen 1 View  Result Date: 10/05/2021 CLINICAL DATA:  History of Crohn's abdomen pain EXAM: ABDOMEN - 1 VIEW COMPARISON:  CT 05/14/2019 FINDINGS: Air-filled distended central small bowel up to 5.5 cm. Scattered colon gas. No abnormal calcification. IMPRESSION: Air distended central small bowel which could be due to partial obstruction or ileus. Electronically Signed   By: Donavan Foil M.D.   On: 10/05/2021 18:13    Procedures Procedures (including critical care time)  Medications Ordered in UC Medications  ondansetron (ZOFRAN-ODT) disintegrating tablet 4 mg (4 mg Oral Given 10/05/21 1806)    Initial Impression / Assessment and Plan / UC Course  I have reviewed the triage vital signs and the nursing notes.  Pertinent labs & imaging results that were available during my care of the patient were reviewed by me and considered in my medical decision making (see chart for details).     Given severity of symptoms discussed potential utility of going to the emergency room for CT scan but patient declined this today.  X-ray was obtained that showed abnormalities concerning for partial obstruction versus ileus.  Discussed that this indicates he needs to go to the emergency room for further evaluation including CT scan to which he was agreeable.  He was given Zofran  while in clinic today with improvement in nausea symptoms.  Recommended going immediately to the ER for further evaluation and management.  Vital signs were stable at the time of discharge and he is safe for private transport.  We will have significant other transport him to ER for further evaluation and management.  Discussed case with Dr. Windy Carina who agreed with treatment plan.  Final Clinical Impressions(s) / UC Diagnoses   Final diagnoses:  Lower abdominal pain  Nausea without vomiting  History of Crohn's disease  Abnormal abdominal x-ray     Discharge Instructions      You to go to the emergency room immediately for further evaluation and management as we discussed.     ED Prescriptions   None    PDMP not reviewed this encounter.   Terrilee Croak, PA-C 10/05/21 1836

## 2021-10-05 NOTE — ED Notes (Signed)
Patient transported to CT 

## 2021-10-05 NOTE — ED Notes (Signed)
X1 no response for vitals

## 2021-10-05 NOTE — ED Notes (Signed)
Patient is being discharged from the Urgent Care and sent to the Emergency Department via POV with family . Per Verna Czech, PA, patient is in need of higher level of care due to possible bowel obstruction or ileus. Patient is aware and verbalizes understanding of plan of care.  Vitals:   10/05/21 1736  BP: 119/72  Pulse: 87  Temp: 98.3 F (36.8 C)  SpO2: 97%

## 2021-10-05 NOTE — ED Provider Triage Note (Signed)
Emergency Medicine Provider Triage Evaluation Note  Shane Klein , a 31 y.o. male  was evaluated in triage.  Pt complains of increasing abdominal pain over the last 2 days.  Pain comes and goes, described as sharp.  Hx of several bowel obstructions, and chronic history of Crohn's.  Referred from UC for possible obstruction.  Denies fevers or urinary symptoms.  Review of Systems  Positive:  Negative: As above  Physical Exam  BP 124/86 (BP Location: Left Arm)   Pulse 90   Temp 98.5 F (36.9 C) (Oral)   Resp 18   SpO2 100%  Gen:   Awake, mildly ill-appearing Resp:  Normal effort  MSK:   Moves extremities without difficulty  Other:  Umbilical and suprapubic tenderness, abdomen soft  Medical Decision Making  Medically screening exam initiated at 7:28 PM.  Appropriate orders placed.  Shane Klein was informed that the remainder of the evaluation will be completed by another provider, this initial triage assessment does not replace that evaluation, and the importance of remaining in the ED until their evaluation is complete.     Prince Rome, PA-C 97/94/80 [id number]

## 2021-10-05 NOTE — Discharge Instructions (Signed)
You to go to the emergency room immediately for further evaluation and management as we discussed.

## 2021-10-05 NOTE — ED Triage Notes (Signed)
Pt has crohn's , having abd pains constipation and back pains for couple days.

## 2021-10-05 NOTE — ED Triage Notes (Signed)
Pt reports h/o chrons diease , Pt reports having abd pains , constipation and back pains for couple days . Pt was sent here from UC. Pt reports he thinks he might have a bowel obstruction pt reports he has had a lot in the past.

## 2021-10-06 ENCOUNTER — Encounter (HOSPITAL_COMMUNITY): Payer: Self-pay | Admitting: Internal Medicine

## 2021-10-06 DIAGNOSIS — Z87891 Personal history of nicotine dependence: Secondary | ICD-10-CM | POA: Diagnosis not present

## 2021-10-06 DIAGNOSIS — K219 Gastro-esophageal reflux disease without esophagitis: Secondary | ICD-10-CM | POA: Diagnosis present

## 2021-10-06 DIAGNOSIS — K660 Peritoneal adhesions (postprocedural) (postinfection): Secondary | ICD-10-CM | POA: Diagnosis present

## 2021-10-06 DIAGNOSIS — K50012 Crohn's disease of small intestine with intestinal obstruction: Secondary | ICD-10-CM | POA: Diagnosis not present

## 2021-10-06 DIAGNOSIS — Z6820 Body mass index (BMI) 20.0-20.9, adult: Secondary | ICD-10-CM | POA: Diagnosis not present

## 2021-10-06 DIAGNOSIS — K56609 Unspecified intestinal obstruction, unspecified as to partial versus complete obstruction: Secondary | ICD-10-CM | POA: Diagnosis not present

## 2021-10-06 DIAGNOSIS — K50812 Crohn's disease of both small and large intestine with intestinal obstruction: Secondary | ICD-10-CM | POA: Diagnosis present

## 2021-10-06 DIAGNOSIS — R188 Other ascites: Secondary | ICD-10-CM | POA: Diagnosis present

## 2021-10-06 DIAGNOSIS — D509 Iron deficiency anemia, unspecified: Secondary | ICD-10-CM | POA: Diagnosis present

## 2021-10-06 DIAGNOSIS — E440 Moderate protein-calorie malnutrition: Secondary | ICD-10-CM | POA: Diagnosis present

## 2021-10-06 DIAGNOSIS — K50819 Crohn's disease of both small and large intestine with unspecified complications: Secondary | ICD-10-CM | POA: Diagnosis not present

## 2021-10-06 DIAGNOSIS — Z8379 Family history of other diseases of the digestive system: Secondary | ICD-10-CM | POA: Diagnosis not present

## 2021-10-06 DIAGNOSIS — Z91018 Allergy to other foods: Secondary | ICD-10-CM | POA: Diagnosis not present

## 2021-10-06 DIAGNOSIS — R1032 Left lower quadrant pain: Secondary | ICD-10-CM | POA: Diagnosis present

## 2021-10-06 DIAGNOSIS — E538 Deficiency of other specified B group vitamins: Secondary | ICD-10-CM | POA: Diagnosis present

## 2021-10-06 LAB — CBC
HCT: 23.2 % — ABNORMAL LOW (ref 39.0–52.0)
Hemoglobin: 7.3 g/dL — ABNORMAL LOW (ref 13.0–17.0)
MCH: 23.9 pg — ABNORMAL LOW (ref 26.0–34.0)
MCHC: 31.5 g/dL (ref 30.0–36.0)
MCV: 75.8 fL — ABNORMAL LOW (ref 80.0–100.0)
Platelets: 432 10*3/uL — ABNORMAL HIGH (ref 150–400)
RBC: 3.06 MIL/uL — ABNORMAL LOW (ref 4.22–5.81)
RDW: 21.5 % — ABNORMAL HIGH (ref 11.5–15.5)
WBC: 5.3 10*3/uL (ref 4.0–10.5)
nRBC: 0 % (ref 0.0–0.2)

## 2021-10-06 LAB — COMPREHENSIVE METABOLIC PANEL WITH GFR
ALT: 7 U/L (ref 0–44)
AST: 7 U/L — ABNORMAL LOW (ref 15–41)
Albumin: 1.9 g/dL — ABNORMAL LOW (ref 3.5–5.0)
Alkaline Phosphatase: 33 U/L — ABNORMAL LOW (ref 38–126)
Anion gap: 4 — ABNORMAL LOW (ref 5–15)
BUN: 12 mg/dL (ref 6–20)
CO2: 25 mmol/L (ref 22–32)
Calcium: 8.3 mg/dL — ABNORMAL LOW (ref 8.9–10.3)
Chloride: 109 mmol/L (ref 98–111)
Creatinine, Ser: 0.86 mg/dL (ref 0.61–1.24)
GFR, Estimated: >60 mL/min
Glucose, Bld: 83 mg/dL (ref 70–99)
Potassium: 3.9 mmol/L (ref 3.5–5.1)
Sodium: 138 mmol/L (ref 135–145)
Total Bilirubin: 0.2 mg/dL — ABNORMAL LOW (ref 0.3–1.2)
Total Protein: 5.6 g/dL — ABNORMAL LOW (ref 6.5–8.1)

## 2021-10-06 LAB — HIV ANTIBODY (ROUTINE TESTING W REFLEX): HIV Screen 4th Generation wRfx: NONREACTIVE

## 2021-10-06 LAB — CBG MONITORING, ED: Glucose-Capillary: 75 mg/dL (ref 70–99)

## 2021-10-06 MED ORDER — ACETAMINOPHEN 650 MG RE SUPP: 650.0000 mg | Freq: Four times a day (QID) | RECTAL | Status: DC | PRN

## 2021-10-06 MED ORDER — DEXTROSE-NACL 5-0.9 % IV SOLN: INTRAVENOUS | Status: AC

## 2021-10-06 MED ORDER — HEPARIN SODIUM (PORCINE) 5000 UNIT/ML IJ SOLN
5000.0000 [IU] | Freq: Three times a day (TID) | INTRAMUSCULAR | Status: DC
  Administered 2021-10-06 – 2021-10-08 (×7): 5000 [IU] via SUBCUTANEOUS
  Filled 2021-10-06 (×7): qty 1

## 2021-10-06 MED ORDER — METHYLPREDNISOLONE SODIUM SUCC 125 MG IJ SOLR
60.0000 mg | Freq: Every day | INTRAMUSCULAR | Status: DC
  Administered 2021-10-06 – 2021-10-08 (×3): 60 mg via INTRAVENOUS
  Filled 2021-10-06 (×3): qty 2

## 2021-10-06 MED ORDER — ACETAMINOPHEN 325 MG PO TABS: 650.0000 mg | ORAL_TABLET | Freq: Four times a day (QID) | ORAL | Status: DC | PRN

## 2021-10-06 MED ORDER — HYDROMORPHONE HCL 1 MG/ML IJ SOLN
0.5000 mg | Freq: Once | INTRAMUSCULAR | Status: AC
  Administered 2021-10-06: 0.5 mg via INTRAVENOUS
  Filled 2021-10-06: qty 1

## 2021-10-06 MED ORDER — HYDROMORPHONE HCL 1 MG/ML IJ SOLN
0.5000 mg | INTRAMUSCULAR | Status: DC | PRN
  Administered 2021-10-06 – 2021-10-07 (×5): 0.5 mg via INTRAVENOUS
  Filled 2021-10-06 (×5): qty 1

## 2021-10-06 NOTE — ED Notes (Signed)
X1 no response

## 2021-10-06 NOTE — Progress Notes (Signed)
Same day note  Patient seen and examined at bedside.  Patient was admitted to the hospital for abdominal pain.  At the time of my evaluation, patient complains of feeling little better with the pain.  Denies any nausea vomiting today.  Has had a loose watery stool yesterday.  Physical examination reveals average built male.  Diffuse tenderness on palpation of the abdomen   Laboratory data and imaging was reviewed  Assessment and Plan.  Small bowel obstruction secondary to Crohn's exacerbation.  History of Crohn's disease with recurrent SBO. Currently NPO.  Continue IV fluids and analgesics.  GI on board and ferritin hepatitis panel iron panel,  Vitamin B12 reticulocytes have been ordered.  Continue IV Dilaudid.  Continue IV Solu-Medrol.  Seen by general surgery as well.  We will consider NG tube if nausea and vomiting  Chronic anemia likely from chronic disease we will continue to follow CBC.  No Charge  Signed,  Delila Pereyra, MD [facility]

## 2021-10-06 NOTE — Hospital Course (Addendum)
Shane Klein is a 31 y.o. male with past medical history of Crohn's disease presented to the hospital with abdominal pain for the last 3 days mostly in the lower quadrants.  He did have some nausea and vomiting.  In the ED, CT scan of the abdomen pelvis was concerning for bowel obstruction with Crohn's exacerbation.  General surgery and GI was consulted and patient was admitted hospital for further evaluation and treatment.   Assessment and Plan.  Principal Problem:   SBO (small bowel obstruction) (HCC) Active Problems:   CROHN'S DISEASE-LARGE & SMALL INTESTINE   Vitamin B12 deficiency   Small bowel obstruction secondary to Crohn's exacerbation.  History of Crohn's disease with recurrent SBO. Currently on clears..  Continue IV fluids and analgesics.  GI on board.  Hepatitis B surface antibody reactive.  HCV antibody nonreactive.  Hepatitis B surface antigen nonreactive.  Folic acid of 6.3 with vitamin B12 low at 160.  Iron low at 38 with low saturation of 14.  Is currently on IV Solu-Medrol as per GI.  We will likely need Biologics as outpatient..  Seen by general surgery and recommended advance diet as tolerated.  Will advance to full liquids today.  Moderate PEM.  Consulted nutrition services.  Risk for Refeeding syndrome.  Has been started on thiamine.  Continue nutritional supplements.   Chronic anemia likely from chronic disease we will continue to follow CBC.  Vitamin B12 deficiency.  We will give 1 dose of parenteral vitamin B12 followed by oral daily.

## 2021-10-06 NOTE — H&P (Signed)
History and Physical    Shane Klein BSW:[id number] DOB: 08/15/1990 DOA: 10/05/2021  PCP: Pcp, No  Patient coming from: Home.  Chief Complaint: Abdominal pain.  HPI: Shane Klein is a 31 y.o. male with history of chronic disease presented to the ER with complaints of abdominal pain ongoing for the last 3 days.  Pain is mostly in the lower quadrants.  Has nausea no vomiting did move his bowels after straining no blood in the stools.  Denies fever chills.  ED Course: In the ER CT scan of the abdomen pelvis shows features concerning for bowel obstruction with Crohn's exacerbation.  On-call general surgeon was consulted.  Patient admitted for further work-up.  Labs do show anemia with hemoglobin around 8.3.  Review of Systems: As per HPI, rest all negative.   Past Medical History:  Diagnosis Date   ANEMIA-IRON DEFICIENCY 04/02/2009   Crohn's disease ([place]) 2010   by colon:cecum and ascending colon, by EGD: duodenum, by imaging also in ileum. No villous atrophy on duodenal biopsies.    GERD (gastroesophageal reflux disease)    GI bleed 04/22/2013   EGD by Dr Teena Irani unremarkable   Iron deficiency anemia    Protein-calorie malnutrition, severe ([place]) 04/15/2013   SBO (small bowel obstruction) ([place]) 2013   in [place] in area of active Crohn's.    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 Fuller Plan: evidence of Crohn's at cecum, ascending colon, unable to intubate the terminal ileum. CTE with distal and terminal ileitis   ESOPHAGOGASTRODUODENOSCOPY  Oct 2010   Dr. Fuller Plan: duodenitis, normal villi, likely Crohn's. Elevated Gliadin but normal TTG, IgA and IgA   ESOPHAGOGASTRODUODENOSCOPY N/A 04/24/2013   Procedure: ESOPHAGOGASTRODUODENOSCOPY (EGD);  Surgeon: Missy Sabins, MD;  Location[facility];  Service: Endoscopy;  Laterality: N/A;     reports that he quit smoking about 4 years ago. His smoking use included cigarettes. He  has a 3.75 pack-year smoking history. He has never used smokeless tobacco. He reports current drug use. Drug: Marijuana. He reports that he does not drink alcohol.  Allergies  Allergen Reactions   Gluten Meal Other (See Comments)    Stomach pain   Wheat Bran Other (See Comments)    Belly pain    Family History  Problem Relation Age of Onset   Diabetes Maternal Aunt    Diabetes Maternal Grandmother    Crohn's disease Maternal Aunt    Hypertension Other    Colon cancer Neg Hx     Prior to Admission medications   Medication Sig Start Date End Date Taking? Authorizing Provider  acetaminophen (TYLENOL) 500 MG tablet Take 1,000 mg by mouth every 6 (six) hours as needed for moderate pain or headache.   Yes [provider]    Physical Exam: Constitutional: Moderately built and nourished. Vitals:   10/06/21 0115 10/06/21 0238 10/06/21 0300 10/06/21 0315  BP: 102/78 122/74 114/76 108/76  Pulse: 72 78 82 73  Resp: (!) 21 15 (!) 26 16  Temp:      TempSrc:      SpO2: 100% 100% 98% 100%   Eyes: Anicteric no pallor. ENMT: No discharge from the ears eyes nose and mouth. Neck: No mass felt.  No neck rigidity. Respiratory: No rhonchi or crepitations. Cardiovascular: S1-S2 heard. Abdomen: Mildly distended tenderness in the lower quadrant but no guarding or rigidity. Musculoskeletal: No edema. Skin: No rash. Neurologic: Alert awake  oriented to time place and person.  Moves all extremities. Psychiatric: Appears normal.  Normal affect.   Labs on Admission: I have personally reviewed following labs and imaging studies  CBC: Recent Labs  Lab 10/05/21 1945  WBC 8.0  HGB 8.3*  HCT 27.0*  MCV 76.9*  PLT 496*   Basic Metabolic Panel: Recent Labs  Lab 10/05/21 1945  NA 137  K 3.9  CL 106  CO2 25  GLUCOSE 96  BUN 15  CREATININE 1.07  CALCIUM 8.4*   GFR: CrCl cannot be calculated (Unknown ideal weight.). Liver Function Tests: Recent Labs  Lab 10/05/21 1945  AST  7*  ALT 7  ALKPHOS 41  BILITOT 0.4  PROT 6.3*  ALBUMIN 2.2*   Recent Labs  Lab 10/05/21 1945  LIPASE 33   No results for input(s): "AMMONIA" in the last 168 hours. Coagulation Profile: No results for input(s): "INR", "PROTIME" in the last 168 hours. Cardiac Enzymes: No results for input(s): "CKTOTAL", "CKMB", "CKMBINDEX", "TROPONINI" in the last 168 hours. BNP (last 3 results) No results for input(s): "PROBNP" in the last 8760 hours. HbA1C: No results for input(s): "HGBA1C" in the last 72 hours. CBG: No results for input(s): "GLUCAP" in the last 168 hours. Lipid Profile: No results for input(s): "CHOL", "HDL", "LDLCALC", "TRIG", "CHOLHDL", "LDLDIRECT" in the last 72 hours. Thyroid Function Tests: No results for input(s): "TSH", "T4TOTAL", "FREET4", "T3FREE", "THYROIDAB" in the last 72 hours. Anemia Panel: No results for input(s): "VITAMINB12", "FOLATE", "FERRITIN", "TIBC", "IRON", "RETICCTPCT" in the last 72 hours. Urine analysis:    Component Value Date/Time   COLORURINE YELLOW 10/05/2021 1910   APPEARANCEUR CLEAR 10/05/2021 1910   LABSPEC 1.015 10/05/2021 1910   PHURINE 5.0 10/05/2021 1910   GLUCOSEU NEGATIVE 10/05/2021 1910   HGBUR NEGATIVE 10/05/2021 1910   BILIRUBINUR NEGATIVE 10/05/2021 1910   KETONESUR NEGATIVE 10/05/2021 1910   PROTEINUR NEGATIVE 10/05/2021 1910   UROBILINOGEN 0.2 11/08/2014 2150   NITRITE NEGATIVE 10/05/2021 1910   LEUKOCYTESUR NEGATIVE 10/05/2021 1910   Sepsis Labs: @LABRCNTIP (procalcitonin:4,lacticidven:4) )No results found for this or any previous visit (from the past 240 hour(s)).   Radiological Exams on Admission: CT Abdomen Pelvis W Contrast  Result Date: 10/05/2021 CLINICAL DATA:  Concern for bowel obstruction.  History of Crohn's. EXAM: CT ABDOMEN AND PELVIS WITH CONTRAST TECHNIQUE: Multidetector CT imaging of the abdomen and pelvis was performed using the standard protocol following bolus administration of intravenous contrast.  RADIATION DOSE REDUCTION: This exam was performed according to the departmental dose-optimization program which includes automated exposure control, adjustment of the mA and/or kV according to patient size and/or use of iterative reconstruction technique. CONTRAST:  100 cc Isovue 370 COMPARISON:  CT abdomen pelvis dated 05/14/2019. FINDINGS: Lower chest: The visualized lung bases are clear. No intra-abdominal free air. Small ascites, increased since the prior CT. Hepatobiliary: No focal liver abnormality is seen. No gallstones, gallbladder wall thickening, or biliary dilatation. Pancreas: Unremarkable. No pancreatic ductal dilatation or surrounding inflammatory changes. Spleen: Indeterminate subcentimeter splenic hypodense lesion, possibly a cyst or hemangioma. The spleen is otherwise unremarkable. Adrenals/Urinary Tract: The adrenal glands unremarkable. The kidneys, visualized ureters, and urinary bladder appear unremarkable. Stomach/Bowel: There is inflammatory changes and thickening of several loops of small bowel in the lower abdomen in keeping with Crohn's disease. There is focal area of tethering of adjacent loops of small bowel in the right lower quadrant consistent with adhesions. Evaluation for possible fistula is limited due to extensive adhesion and in the absence of oral contrast.  There is high-grade narrowing of the bowel at the level of the adhesion with dilatation of the segment of the bowel proximal to the adhesion measuring up to 5.5 cm in caliber. Additionally there is loss of fat plane between a dilated loop of bowel and sigmoid colon likely representing adhesion. The appendix is not identified with certainty. Vascular/Lymphatic: The abdominal aorta and IVC are unremarkable. No portal venous gas. There is no adenopathy. Reproductive: The prostate and seminal vesicles are grossly unremarkable. No pelvic mass. Other: None Musculoskeletal: No acute or significant osseous findings. IMPRESSION: 1.  Active Crohn's disease with adhesion of loops of small bowel in the right lower quadrant. There is associated narrowing and obstruction of small bowel at the level of adhesion. 2. Small ascites, increased since the prior CT. Electronically Signed   By: Anner Crete M.D.   On: 10/05/2021 21:46   DG Abdomen 1 View  Result Date: 10/05/2021 CLINICAL DATA:  History of Crohn's abdomen pain EXAM: ABDOMEN - 1 VIEW COMPARISON:  CT 05/14/2019 FINDINGS: Air-filled distended central small bowel up to 5.5 cm. Scattered colon gas. No abnormal calcification. IMPRESSION: Air distended central small bowel which could be due to partial obstruction or ileus. Electronically Signed   By: Donavan Foil M.D.   On: 10/05/2021 18:13      Assessment/Plan Principal Problem:   SBO (small bowel obstruction) (HCC) Active Problems:   CROHN'S DISEASE-LARGE & SMALL INTESTINE    Small bowel obstruction secondary to Crohn's exacerbation for which patient will be kept n.p.o. IV fluids pain medication we will consult [facility] likely will need IV steroids.  General surgery also has been notified. Chronic anemia likely from chronic disease follow CBC.  Since patient has bowel obstruction with Crohn's disease will need further management and inpatient status.   DVT prophylaxis: Heparin. Code Status: Full code. Family Communication: Family at the bedside. Disposition Plan: Home. Consults called: General surgery.  We will consult GI. Admission status: Inpatient.   Rise Patience MD [facility] Pager [phone].  If 7PM-7AM, please contact night-coverage [email or web address] Password [facility]  10/06/2021, 4:22 AM

## 2021-10-06 NOTE — TOC Initial Note (Addendum)
Transition of Care [facility]) - Initial/Assessment Note    Patient Details  Name: Shane Klein MRN: [id number] Date of Birth: 1990-08-14  Transition [facility] CM/SW Contact:    Tom-Johnson, Renea Ee, RN Phone Number: 10/06/2021, 1:14 PM  Clinical Narrative:                  CM spoke with patient at bedside about needs for post hospital transition. Admitted for SBO secondary to Crohn's exacerbation. Has hx of SBO and non compliant with MD f/u.  Had a BM today per nursing. Currently on IV Solu-Medrol.  From home with Fiance and two out of four children. Not employed and not on disability.States both his parents and two sisters are supportive at time. Does not have DME's.  Does not have a PCP, hosp f/u scheduled with [place] [facility] per patient's request. Info on AVS. Uses [facility] pharmacy.  NO PT/OT needs or recommendations noted. CM will continue to follow with needs.    Expected Discharge Plan: Home/Self Care Barriers to Discharge: Continued Medical Work up   Patient Goals and CMS Choice Patient states their goals for this hospitalization and ongoing recovery are:: Toreturn home CMS Medicare.gov Compare Post Acute Care list provided to:: Patient Choice offered to / list presented to : NA  Expected Discharge Plan and Services Expected Discharge Plan: Home/Self Care In-house Referral: PCP / [facility] Discharge Planning Services: CM Consult, Follow-up appt scheduled Post Acute Care Choice: NA Living arrangements for the past 2 months: Single Family Home                 DME Arranged: N/A DME Agency: NA       HH Arranged: NA HH Agency: NA        Prior Living Arrangements/Services Living arrangements for the past 2 months: Single Family Home Lives with:: Significant Other Patient language and need for interpreter reviewed:: Yes Do you feel safe going back to the place where you live?: Yes      Need for Family Participation in Patient Care:  Yes (Comment) Care giver support system in place?: Yes (comment)   Criminal Activity/Legal Involvement Pertinent to Current Situation/Hospitalization: No - Comment as needed  Activities of Daily Living Home Assistive Devices/Equipment: None ADL Screening (condition at time of admission) Patient's cognitive ability adequate to safely complete daily activities?: Yes Is the patient deaf or have difficulty hearing?: No Does the patient have difficulty seeing, even when wearing glasses/contacts?: No Does the patient have difficulty concentrating, remembering, or making decisions?: No Patient able to express need for assistance with ADLs?: Yes Does the patient have difficulty dressing or bathing?: No Independently performs ADLs?: Yes (appropriate for developmental age) Does the patient have difficulty walking or climbing stairs?: No Weakness of Legs: None Weakness of Arms/Hands: None  Permission Sought/Granted Permission sought to share information with : Case Manager, Family Supports Permission granted to share information with : Yes, Verbal Permission Granted              Emotional Assessment Appearance:: Appears stated age Attitude/Demeanor/Rapport: Engaged, Gracious Affect (typically observed): Accepting, Appropriate, Calm, Hopeful Orientation: : Oriented to Self, Oriented to Place, Oriented to  Time, Oriented to Situation Alcohol / Substance Use: Not Applicable Psych Involvement: No (comment)  Admission diagnosis:  Small bowel obstruction (HCC) [K56.609] SBO (small bowel obstruction) ([place]) [K56.609] Patient Active Problem List   Diagnosis Date Noted   SBO (small bowel obstruction) ([place]) 10/06/2021   Abdominal pain 08/04/2014  Thrombocytosis 08/04/2014   Microcytic anemia 08/04/2014   Abdominal pain, left lower quadrant    Crohn's colitis ([place]) 07/31/2013   Partial small bowel obstruction ([place]) 07/31/2013   Diarrhea 07/31/2013   Tobacco use 07/31/2013   Crohn's ileitis  ([place]) 07/31/2013   Pedal edema 04/24/2013   GI bleed 04/22/2013   Edema extremities 04/22/2013   Protein-calorie malnutrition, severe ([place]) 04/15/2013   Small bowel obstruction ([place]) 04/14/2013   GERD (gastroesophageal reflux disease) 11/20/2012   Crohn's disease of ileum ([place]) 10/05/2012   Crohn's disease with intestinal obstruction ([place]) 03/01/2012   Anemia 03/01/2012   CROHN'S DISEASE-LARGE & SMALL INTESTINE 05/16/2009   Iron deficiency anemia 04/02/2009   DUODENITIS 04/02/2009   ABDOMINAL PAIN OTHER SPECIFIED SITE 04/02/2009   WEIGHT LOSS-ABNORMAL 01/27/2009   PCP:  Pcp, No Pharmacy:   [facility] [place], [place] [address] Phone: [phone] Fax: [phone]  [facility] [phone] - [place], [place] - [address] [place] [address] Phone: [phone] Fax: [phone]     Social Determinants of Health (SDOH) Interventions    Readmission Risk Interventions     No data to display

## 2021-10-06 NOTE — Consult Note (Signed)
Reason for Consult/Chief Complaint: SBO Consultant: Shane Klein, [place]  Shane Klein is an 31 y.o. male.   HPI: 52M with h/o Crohns disease and multiple SBO presents with nausea and pain for three days. Last BM 1700 on 10/05/2021. Prior GI doc was Dr. Therisa Klein, last seen in 2019. In 2019 , was incarcerated until Dec 2020 and did not follow up after release. Does not take any medications for his Crohns. Was taking Humira previously. Last colonoscopy in 2020.   Past Medical History:  Diagnosis Date   ANEMIA-IRON DEFICIENCY 04/02/2009   Crohn's disease ([place]) 2010   by colon:cecum and ascending colon, by EGD: duodenum, by imaging also in ileum. No villous atrophy on duodenal biopsies.    GERD (gastroesophageal reflux disease)    GI bleed 04/22/2013   EGD by Dr Shane Klein unremarkable   Iron deficiency anemia    Protein-calorie malnutrition, severe ([place]) 04/15/2013   SBO (small bowel obstruction) ([place]) 2013   in [place] in area of active Crohn's.    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 Fuller Klein: evidence of Crohn's at cecum, ascending colon, unable to intubate the terminal ileum. CTE with distal and terminal ileitis   ESOPHAGOGASTRODUODENOSCOPY  Oct 2010   Dr. Fuller Klein: duodenitis, normal villi, likely Crohn's. Elevated Gliadin but normal TTG, IgA and IgA   ESOPHAGOGASTRODUODENOSCOPY N/A 04/24/2013   Procedure: ESOPHAGOGASTRODUODENOSCOPY (EGD);  Surgeon: Shane Sabins, MD;  Location: [facility] ENDOSCOPY;  Service: Endoscopy;  Laterality: N/A;    Family History  Problem Relation Age of Onset   Diabetes Maternal Aunt    Diabetes Maternal Grandmother    Crohn's disease Maternal Aunt    Hypertension Other    Colon cancer Neg Hx     Social History:  reports that he quit smoking about 4 years ago. His smoking use included cigarettes. He has a 3.75 pack-year smoking history. He has never used smokeless tobacco. He reports current drug use.  Drug: Marijuana. He reports that he does not drink alcohol.  Allergies:  Allergies  Allergen Reactions   Gluten Meal Other (See Comments)    Stomach pain   Wheat Bran Other (See Comments)    Belly pain    Medications: I have reviewed the patient's current medications.  Results for orders placed or performed during the hospital encounter of 10/05/21 (from the past 48 hour(s))  Urinalysis, Routine w reflex microscopic     Status: None   Collection Time: 10/05/21  7:10 PM  Result Value Ref Range   Color, Urine YELLOW YELLOW   APPearance CLEAR CLEAR   Specific Gravity, Urine 1.015 1.005 - 1.030   pH 5.0 5.0 - 8.0   Glucose, UA NEGATIVE NEGATIVE mg/dL   Hgb urine dipstick NEGATIVE NEGATIVE   Bilirubin Urine NEGATIVE NEGATIVE   Ketones, ur NEGATIVE NEGATIVE mg/dL   Protein, ur NEGATIVE NEGATIVE mg/dL   Nitrite NEGATIVE NEGATIVE   Leukocytes,Ua NEGATIVE NEGATIVE    Comment: Performed at [facility] [address] [place], [place] [address]  Lipase, blood     Status: None   Collection Time: 10/05/21  7:45 PM  Result Value Ref Range   Lipase 33 11 - 51 U/L    Comment: Performed at [facility] [address] [place], [place] [address]  Comprehensive metabolic panel     Status: Abnormal   Collection Time: 10/05/21  7:45 PM  Result  Value Ref Range   Sodium 137 135 - 145 mmol/L   Potassium 3.9 3.5 - 5.1 mmol/L   Chloride 106 98 - 111 mmol/L   CO2 25 22 - 32 mmol/L   Glucose, Bld 96 70 - 99 mg/dL    Comment: Glucose reference range applies only to samples taken after fasting for at least 8 hours.   BUN 15 6 - 20 mg/dL   Creatinine, Ser 1.07 0.61 - 1.24 mg/dL   Calcium 8.4 (L) 8.9 - 10.3 mg/dL   Total Protein 6.3 (L) 6.5 - 8.1 g/dL   Albumin 2.2 (L) 3.5 - 5.0 g/dL   AST 7 (L) 15 - 41 U/L   ALT 7 0 - 44 U/L   Alkaline Phosphatase 41 38 - 126 U/L   Total Bilirubin 0.4 0.3 - 1.2 mg/dL   GFR, Estimated >60 >60 mL/min    Comment: (NOTE) Calculated using the CKD-EPI  Creatinine Equation (2021)    Anion gap 6 5 - 15    Comment: Performed at [facility], [place] [address], [place], [place] [address]  CBC     Status: Abnormal   Collection Time: 10/05/21  7:45 PM  Result Value Ref Range   WBC 8.0 4.0 - 10.5 K/uL   RBC 3.51 (L) 4.22 - 5.81 MIL/uL   Hemoglobin 8.3 (L) 13.0 - 17.0 g/dL    Comment: Reticulocyte Hemoglobin testing may be clinically indicated, consider ordering this additional test [id number]    HCT 27.0 (L) 39.0 - 52.0 %   MCV 76.9 (L) 80.0 - 100.0 fL   MCH 23.6 (L) 26.0 - 34.0 pg   MCHC 30.7 30.0 - 36.0 g/dL   RDW 21.4 (H) 11.5 - 15.5 %   Platelets 548 (H) 150 - 400 K/uL   nRBC 0.0 0.0 - 0.2 %    Comment: Performed at [facility], [place] [address] [place], [place] [address]  CBC     Status: Abnormal   Collection Time: 10/06/21  4:42 AM  Result Value Ref Range   WBC 5.3 4.0 - 10.5 K/uL   RBC 3.06 (L) 4.22 - 5.81 MIL/uL   Hemoglobin 7.3 (L) 13.0 - 17.0 g/dL    Comment: Reticulocyte Hemoglobin testing may be clinically indicated, consider ordering this additional test [id number]    HCT 23.2 (L) 39.0 - 52.0 %   MCV 75.8 (L) 80.0 - 100.0 fL   MCH 23.9 (L) 26.0 - 34.0 pg   MCHC 31.5 30.0 - 36.0 g/dL   RDW 21.5 (H) 11.5 - 15.5 %   Platelets 432 (H) 150 - 400 K/uL   nRBC 0.0 0.0 - 0.2 %    Comment: Performed at [facility], [place] [address], [place], [place] [address]  Comprehensive metabolic panel     Status: Abnormal   Collection Time: 10/06/21  4:42 AM  Result Value Ref Range   Sodium 138 135 - 145 mmol/L   Potassium 3.9 3.5 - 5.1 mmol/L   Chloride 109 98 - 111 mmol/L   CO2 25 22 - 32 mmol/L   Glucose, Bld 83 70 - 99 mg/dL    Comment: Glucose reference range applies only to samples taken after fasting for at least 8 hours.   BUN 12 6 - 20 mg/dL   Creatinine, Ser 0.86 0.61 - 1.24 mg/dL   Calcium 8.3 (L) 8.9 - 10.3 mg/dL   Total Protein 5.6 (L) 6.5 - 8.1 g/dL   Albumin 1.9 (L) 3.5 -  5.0 g/dL   AST 7 (L) 15 - 41  U/L   ALT 7 0 - 44 U/L   Alkaline Phosphatase 33 (L) 38 - 126 U/L   Total Bilirubin 0.2 (L) 0.3 - 1.2 mg/dL   GFR, Estimated >60 >60 mL/min    Comment: (NOTE) Calculated using the CKD-EPI Creatinine Equation (2021)    Anion gap 4 (L) 5 - 15    Comment: Performed at [facility] [address] [place], [place] [address]    CT Abdomen Pelvis W Contrast  Result Date: 10/05/2021 CLINICAL DATA:  Concern for bowel obstruction.  History of Crohn's. EXAM: CT ABDOMEN AND PELVIS WITH CONTRAST TECHNIQUE: Multidetector CT imaging of the abdomen and pelvis was performed using the standard protocol following bolus administration of intravenous contrast. RADIATION DOSE REDUCTION: This exam was performed according to the departmental dose-optimization program which includes automated exposure control, adjustment of the mA and/or kV according to patient size and/or use of iterative reconstruction technique. CONTRAST:  100 cc Isovue 370 COMPARISON:  CT abdomen pelvis dated 05/14/2019. FINDINGS: Lower chest: The visualized lung bases are clear. No intra-abdominal free air. Small ascites, increased since the prior CT. Hepatobiliary: No focal liver abnormality is seen. No gallstones, gallbladder wall thickening, or biliary dilatation. Pancreas: Unremarkable. No pancreatic ductal dilatation or surrounding inflammatory changes. Spleen: Indeterminate subcentimeter splenic hypodense lesion, possibly a cyst or hemangioma. The spleen is otherwise unremarkable. Adrenals/Urinary Tract: The adrenal glands unremarkable. The kidneys, visualized ureters, and urinary bladder appear unremarkable. Stomach/Bowel: There is inflammatory changes and thickening of several loops of small bowel in the lower abdomen in keeping with Crohn's disease. There is focal area of tethering of adjacent loops of small bowel in the right lower quadrant consistent with adhesions. Evaluation for possible fistula is limited due to extensive adhesion  and in the absence of oral contrast. There is high-grade narrowing of the bowel at the level of the adhesion with dilatation of the segment of the bowel proximal to the adhesion measuring up to 5.5 cm in caliber. Additionally there is loss of fat plane between a dilated loop of bowel and sigmoid colon likely representing adhesion. The appendix is not identified with certainty. Vascular/Lymphatic: The abdominal aorta and IVC are unremarkable. No portal venous gas. There is no adenopathy. Reproductive: The prostate and seminal vesicles are grossly unremarkable. No pelvic mass. Other: None Musculoskeletal: No acute or significant osseous findings. IMPRESSION: 1. Active Crohn's disease with adhesion of loops of small bowel in the right lower quadrant. There is associated narrowing and obstruction of small bowel at the level of adhesion. 2. Small ascites, increased since the prior CT. Electronically Signed   By: Anner Crete M.D.   On: 10/05/2021 21:46   DG Abdomen 1 View  Result Date: 10/05/2021 CLINICAL DATA:  History of Crohn's abdomen pain EXAM: ABDOMEN - 1 VIEW COMPARISON:  CT 05/14/2019 FINDINGS: Air-filled distended central small bowel up to 5.5 cm. Scattered colon gas. No abnormal calcification. IMPRESSION: Air distended central small bowel which could be due to partial obstruction or ileus. Electronically Signed   By: Donavan Foil M.D.   On: 10/05/2021 18:13    ROS 10 point review of systems is negative except as listed above in HPI.   Physical Exam Blood pressure 108/76, pulse 73, temperature 98.5 F (36.9 C), temperature source Oral, resp. rate 16, SpO2 100 %. Constitutional: well-developed, well-nourished HEENT: pupils equal, round, reactive to light, 56m b/l, moist conjunctiva, external inspection of ears and nose  normal, hearing intact Oropharynx: normal oropharyngeal mucosa, poor dentition Neck: no thyromegaly, trachea midline, no midline cervical tenderness to palpation Chest: breath  sounds equal bilaterally, normal respiratory effort, no midline or lateral chest wall tenderness to palpation/deformity Abdomen: soft, b/l LQ TTP, no bruising, no hepatosplenomegaly GU: no blood at urethral meatus of penis, no scrotal masses or abnormality  Back: no wounds, no thoracic/lumbar spine tenderness to palpation, no thoracic/lumbar spine stepoffs Rectal: deferred Extremities: 2+ radial and pedal pulses bilaterally, intact motor and sensation bilateral UE and LE, no peripheral edema MSK: unable to assess gait/station, no clubbing/cyanosis of fingers/toes, normal ROM of all four extremities Skin: warm, dry, no rashes Psych: normal memory, normal mood/affect     Assessment/Klein: 68M with active Crohns disease and SBO. Recommend GI consult and management of Crohns flare. No active n/v, so will hold off on NGT, but is nausea/vomiting, recommend NGT placement. Will continue to follow and be available for and surgical needs.    Jesusita Oka, MD General and [facility]

## 2021-10-06 NOTE — Consult Note (Signed)
[facility] Consult: 11:23 AM 10/06/2021  LOS: 0 days    Referring Provider: Dr Renetta Chalk.    Primary Care Physician:  Pcp, No Primary Gastroenterologist:  Dr. Therisa Doyne.  Discharged from [facility] practice.  Dr Domenica Fail at [facility], pt has not followed up since 05/2018.      Reason for Consultation:  SBO.  Hx SB Crohn's.     HPI: Shane Klein is a 31 y.o. male.  PMH SB Crohn's disease.  Multiple SBO's.  Possible celiac disease, not confirmed.  Anemia.  GIB 2014.  2 PRBCs in 2014 for Hgb 6.9.  malnutrition in 2014 Previous GI, Dr. Deno Etienne had last seen patient in 2019.  After that he was incarcerated through 03/2019 and never followed up w [facility].    2010 Colonoscopy.  Path: Active colitis, focal ulceration and surface inflammatory exudate consistent with Crohn's disease.  No dysplasia. 2015 Colonoscopy: inflamed cecum, multiple polyps. Latest colonoscopy 05/2018 with Dr. Domenica Fail at [facility].  Poor prep.  Hemorrhoids.  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  Dilation not possible.  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  No Crohn's in the colon.  MD advised continuing Humira every 2 weeks.  Slight that was the last time he had any GI involvement.   2010 EGD, pathology found.  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 2014 EGD .  Dr Amedeo Plenty.  Unremarkable.    Multiple hospitalizations in past years for Crohn's flare, bowel obstructions.  Treated with steroids and discharged. Patient is no longer on Humira or other Crohn's management diseases.  Previously was on azathioprine, sulfasalazine, budesonide, folate, B12, iron.  When patient left prison in early 2020, he started reading about Humira and stopped taking it because of concerns  for potential side effects. Has received blood transfusion in 2014 for anemia. For the last 3 to 4 months he has had monthly flares of symptoms similar to current symptoms i.e. abdominal pain diffusely, nonbloody emesis, constipation and or loose, nonbloody stools.  Goes to an urgent care and treated  w short course of prednisone.  Does well for a few weeks only to have recurrent symptoms. Current symptoms present for 3 days consisting of 2 loose, nonbloody stools a day.  Nausea without vomiting.  No fever.  Pain is diffuse and fairly intense.  At home he has been using Tylenol as needed but not in large quantities or frequently.  Has back pain and the abdominal pain radiates into his back.  No visual changes.  No skin problems.  WBCs normal.  Hb 7.3, MCV 75.  Platelets 430. Albumin 1.9.  Otherwise LFTs at or below normal.  Calcium low 8.3.  Bmet normal. CTAP: Active SB Crohn's disease and small bowel with narrowing, obstruction at SB adhesion of loops of small bowel and right lower quadrant.  Mild volume ascites.  Dr Bobbye Morton of [facility] saw pt.  No NGT rec since not having N/V.  No plans for surgery.    Family history of Crohn's  disease and a maternal aunt.  No family history colorectal cancer or UC. Patient has Medicaid.  Lives with his fiance.  They have 2 children.  He has 2 other children who live with their mother.  Smokes marijuana about 3 times a day on a regular basis but at times does not smoke when he does not have access to product.    Past Medical History:  Diagnosis Date   ANEMIA-IRON DEFICIENCY 04/02/2009   Crohn's disease ([place]) 2010   by colon:cecum and ascending colon, by EGD: duodenum, by imaging also in ileum. No villous atrophy on duodenal biopsies.    GERD (gastroesophageal reflux disease)    GI bleed 04/22/2013   EGD by Dr Teena Irani unremarkable   Iron deficiency anemia    Protein-calorie malnutrition, severe ([place]) 04/15/2013   SBO (small bowel obstruction) ([place])  2013   in [place] in area of active Crohn's.    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 Fuller Plan: evidence of Crohn's at cecum, ascending colon, unable to intubate the terminal ileum. CTE with distal and terminal ileitis   ESOPHAGOGASTRODUODENOSCOPY  Oct 2010   Dr. Fuller Plan: duodenitis, normal villi, likely Crohn's. Elevated Gliadin but normal TTG, IgA and IgA   ESOPHAGOGASTRODUODENOSCOPY N/A 04/24/2013   Procedure: ESOPHAGOGASTRODUODENOSCOPY (EGD);  Surgeon: Missy Sabins, MD;  Location: [facility] END[facility]COPY;  Service: Endoscopy;  Laterality: N/A;    Prior to Admission medications   Medication Sig Start Date End Date Taking? Authorizing Provider  acetaminophen (TYLENOL) 500 MG tablet Take 1,000 mg by mouth every 6 (six) hours as needed for moderate pain or headache.   Yes [provider]    Scheduled Meds:  heparin  5,000 Units Subcutaneous Q8H   Infusions:  dextrose 5 % and 0.9% NaCl 100 mL/hr at 10/06/21 0606   PRN Meds: acetaminophen **OR** acetaminophen, HYDROmorphone (DILAUDID) injection   Allergies as of 10/05/2021 - Review Complete 10/05/2021  Allergen Reaction Noted   Gluten meal Other (See Comments) 10/02/2012   Wheat bran Other (See Comments) 03/01/2012    Family History  Problem Relation Age of Onset   Diabetes Maternal Aunt    Diabetes Maternal Grandmother    Crohn's disease Maternal Aunt    Hypertension Other    Colon cancer Neg Hx     Social History   Socioeconomic History   Marital status: Single    Spouse name: Not on file   Number of children: Not on file   Years of education: Not on file   Highest education level: Not on file  Occupational History   Occupation: unemployed  Tobacco Use   Smoking status: Former    Packs/day: 0.25    Years: 15.00    Total pack years: 3.75    Types: Cigarettes    Quit date: 04/26/2017    Years since quitting: 4.4   Smokeless tobacco: Never  Substance and  Sexual Activity   Alcohol use: No    Comment: occasionally   Drug use: Yes    Types: Marijuana    Comment: occassionally   Sexual activity: Yes  Other Topics Concern   Not on file  Social History Narrative   Lives in [place] now   Works at [employer]    [employer] cigarettes    Social Determinants of Health   Financial Resource Strain: Not on file  Food Insecurity: Not on file  Transportation Needs: Not on  file  Physical Activity: Not on file  Stress: Not on file  Social Connections: Not on file  Intimate Partner Violence: Not on file    REVIEW OF SYSTEMS: Constitutional: Tired all the time but nothing new.  No profound fatigue. ENT:  No nose bleeds Pulm: No shortness of breath.  No cough. CV:  No palpitations, no LE edema.  Angina. GU:  No hematuria, no frequency GI: No dysphagia.  During symptom free bouts he has good appetite. Heme: No unusual or excessive bleeding or bruising. Transfusions: PRBCs in 2014. Neuro:  No headaches, no peripheral tingling or numbness.  No seizures, no syncope Derm:  No itching, no rash or sores.  Endocrine:  No sweats or chills.  No polyuria or dysuria Immunization:   Reviewed. Travel:  None beyond local counties in last few months.    PHYSICAL EXAM: Vital signs in last 24 hours: Vitals:   10/06/21 0617 10/06/21 0802  BP: 108/77 103/68  Pulse: 72 66  Resp: 14 18  Temp: 97.9 F (36.6 C) 97.6 F (36.4 C)  SpO2: 100% 100%   Wt Readings from Last 3 Encounters:  10/06/21 65.7 kg  07/17/19 83.9 kg  05/13/19 81.6 kg    General: Thin, comfortable, pleasant.  Does not look toxic or acutely ill Head: No facial asymmetry or swelling.  No signs of head trauma. Eyes: Conjunctiva pink. Ears: No hearing deficit Nose: No congestion or discharge Mouth: Good dentition.  Tongue midline.  Mucosa pink, moist, clear. Neck: No masses or thyromegaly.  No JVD Lungs: Excellent breath sounds with no labored breathing or cough Heart:  RRR.  No MRG.  S1, S2 present. Abdomen: Soft, diffuse, mild to moderate tenderness without guarding or rebound.  No high-pitched or tinkling bowel sounds.  No increased tympany with percussion..   Rectal: Deferred Musc/Skeltl: No joint redness, swelling or gross deformity. Extremities: No CCE. Neurologic: Fully alert and oriented x3.  Good historian.  Moves all 4 limbs with full strength.  No tremors. Skin: Incomplete survey but no observed rash, sores or suspicious lesions. Tattoos: Multiple professional grade on arms. Nodes: No cervical adenopathy Psych: Pleasant, calm, fluid speech.  Excellent historian.  Intake/Output from previous day: No intake/output data recorded. Intake/Output this shift: No intake/output data recorded.  LAB RESULTS: Recent Labs    10/05/21 1945 10/06/21 0442  WBC 8.0 5.3  HGB 8.3* 7.3*  HCT 27.0* 23.2*  PLT 548* 432*   BMET Lab Results  Component Value Date   NA 138 10/06/2021   NA 137 10/05/2021   NA 139 07/17/2019   K 3.9 10/06/2021   K 3.9 10/05/2021   K 4.0 07/17/2019   CL 109 10/06/2021   CL 106 10/05/2021   CL 110 07/17/2019   CO2 25 10/06/2021   CO2 25 10/05/2021   CO2 23 07/17/2019   GLUCOSE 83 10/06/2021   GLUCOSE 96 10/05/2021   GLUCOSE 80 07/17/2019   BUN 12 10/06/2021   BUN 15 10/05/2021   BUN 12 07/17/2019   CREATININE 0.86 10/06/2021   CREATININE 1.07 10/05/2021   CREATININE 1.11 07/17/2019   CALCIUM 8.3 (L) 10/06/2021   CALCIUM 8.4 (L) 10/05/2021   CALCIUM 8.8 (L) 07/17/2019   LFT Recent Labs    10/05/21 1945 10/06/21 0442  PROT 6.3* 5.6*  ALBUMIN 2.2* 1.9*  AST 7* 7*  ALT 7 7  ALKPHOS 41 33*  BILITOT 0.4 0.2*   PT/INR Lab Results  Component Value Date   INR 1.19 10/15/2010  Hepatitis Panel No results for input(s): "HEPBSAG", "HCVAB", "HEPAIGM", "HEPBIGM" in the last 72 hours. C-Diff No components found for: "CDIFF" Lipase     Component Value Date/Time   LIPASE 33 10/05/2021 1945    Drugs of  Abuse     Component Value Date/Time   LABOPIA POSITIVE (A) 08/04/2014 2312   COCAINSCRNUR NONE DETECTED 08/04/2014 2312   LABBENZ NONE DETECTED 08/04/2014 2312   AMPHETMU NONE DETECTED 08/04/2014 2312   THCU POSITIVE (A) 08/04/2014 2312   LABBARB NONE DETECTED 08/04/2014 2312     RADIOLOGY STUDIES: CT Abdomen Pelvis W Contrast  Result Date: 10/05/2021 CLINICAL DATA:  Concern for bowel obstruction.  History of Crohn's. EXAM: CT ABDOMEN AND PELVIS WITH CONTRAST TECHNIQUE: Multidetector CT imaging of the abdomen and pelvis was performed using the standard protocol following bolus administration of intravenous contrast. RADIATION DOSE REDUCTION: This exam was performed according to the departmental dose-optimization program which includes automated exposure control, adjustment of the mA and/or kV according to patient size and/or use of iterative reconstruction technique. CONTRAST:  100 cc Isovue 370 COMPARISON:  CT abdomen pelvis dated 05/14/2019. FINDINGS: Lower chest: The visualized lung bases are clear. No intra-abdominal free air. Small ascites, increased since the prior CT. Hepatobiliary: No focal liver abnormality is seen. No gallstones, gallbladder wall thickening, or biliary dilatation. Pancreas: Unremarkable. No pancreatic ductal dilatation or surrounding inflammatory changes. Spleen: Indeterminate subcentimeter splenic hypodense lesion, possibly a cyst or hemangioma. The spleen is otherwise unremarkable. Adrenals/Urinary Tract: The adrenal glands unremarkable. The kidneys, visualized ureters, and urinary bladder appear unremarkable. Stomach/Bowel: There is inflammatory changes and thickening of several loops of small bowel in the lower abdomen in keeping with Crohn's disease. There is focal area of tethering of adjacent loops of small bowel in the right lower quadrant consistent with adhesions. Evaluation for possible fistula is limited due to extensive adhesion and in the absence of oral  contrast. There is high-grade narrowing of the bowel at the level of the adhesion with dilatation of the segment of the bowel proximal to the adhesion measuring up to 5.5 cm in caliber. Additionally there is loss of fat plane between a dilated loop of bowel and sigmoid colon likely representing adhesion. The appendix is not identified with certainty. Vascular/Lymphatic: The abdominal aorta and IVC are unremarkable. No portal venous gas. There is no adenopathy. Reproductive: The prostate and seminal vesicles are grossly unremarkable. No pelvic mass. Other: None Musculoskeletal: No acute or significant osseous findings. IMPRESSION: 1. Active Crohn's disease with adhesion of loops of small bowel in the right lower quadrant. There is associated narrowing and obstruction of small bowel at the level of adhesion. 2. Small ascites, increased since the prior CT. Electronically Signed   By: Anner Crete M.D.   On: 10/05/2021 21:46   DG Abdomen 1 View  Result Date: 10/05/2021 CLINICAL DATA:  History of Crohn's abdomen pain EXAM: ABDOMEN - 1 VIEW COMPARISON:  CT 05/14/2019 FINDINGS: Air-filled distended central small bowel up to 5.5 cm. Scattered colon gas. No abnormal calcification. IMPRESSION: Air distended central small bowel which could be due to partial obstruction or ileus. Electronically Signed   By: Donavan Foil M.D.   On: 10/05/2021 18:13      IMPRESSION:   Recurrent SBO, SB Crohn's flare.  Pt off all Crohn's meds for last few years.  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      Anemia.  Chronic.  Microcytic.  Few sxs.      Malnutrition  suspected.     PLAN:      Start IV Solumedrol 60 mg daily.  Quant gold testing and hepatitis testing in anticipation of restarting biologic.  Pre albumin.  Anemia studies.  CBC in AM.  No need for transfusion as not much in way of anemia sxs.       Azucena Freed  10/06/2021, 11:23 AM Phone [phone]

## 2021-10-06 NOTE — ED Provider Notes (Signed)
[facility] EMERGENCY DEPARTMENT Provider Note   CSN: [id number] Arrival date & time: 10/05/21  1858     History  Chief Complaint  Patient presents with   Abdominal Pain    Shane Klein is a 31 y.o. male.  HPI  With medical history including Crohn's disease, anemia due to chronic diseases, recurrent bowel obstructions presents with chief complaint of left lower quadrant tenderness, going on for last 2 days, states she has been having constipation.  States that he did have a bowel movement today, states he noted diarrhea, denies any melena or hematochezia, states he is closely nauseous without vomiting, no history of GI bleeds, he states he has never had any abdominal surgery.  He states that he is currently not take anything for his Crohn's disease, he states he initially was on Humira but stopped it 2 years ago because he read the side effects.  He has not seen his GI doctor in the last few years.  He has no other complaints.   Reviewed patient's chart was seen in urgent care was sent here for further evaluation, he has been admitted in the past for small bowel obstruction secondary due to adhesions, they resolved with conservative management, patient had EDG performed 2020 which was unremarkable.  Home Medications Prior to Admission medications   Medication Sig Start Date End Date Taking? Authorizing Provider  acetaminophen (TYLENOL) 500 MG tablet Take 1,000 mg by mouth every 6 (six) hours as needed for moderate pain or headache.   Yes [provider]      Allergies    Gluten meal and Wheat bran    Review of Systems   Review of Systems  Constitutional:  Negative for chills and fever.  Respiratory:  Negative for shortness of breath.   Cardiovascular:  Negative for chest pain.  Gastrointestinal:  Positive for abdominal pain, diarrhea and nausea. Negative for vomiting.  Neurological:  Negative for headaches.    Physical Exam Updated Vital  Signs BP 102/78   Pulse 72   Temp 98.5 F (36.9 C) (Oral)   Resp (!) 21   SpO2 100%  Physical Exam Vitals and nursing note reviewed.  Constitutional:      General: He is not in acute distress.    Appearance: He is not ill-appearing.  HENT:     Head: Normocephalic and atraumatic.     Nose: No congestion.  Eyes:     Conjunctiva/sclera: Conjunctivae normal.  Cardiovascular:     Rate and Rhythm: Normal rate and regular rhythm.     Pulses: Normal pulses.     Heart sounds: No murmur heard.    No friction rub. No gallop.  Pulmonary:     Effort: No respiratory distress.     Breath sounds: No wheezing, rhonchi or rales.  Abdominal:     General: There is no distension.     Palpations: Abdomen is soft.     Tenderness: There is abdominal tenderness. There is no right CVA tenderness or left CVA tenderness.     Comments: Abdomen nondistended, normal bowel sounds, dull percussion, slight tenderness in the lower  Abdomen, no guarding, rebound has or peritoneal sign negative [employer] point.  Skin:    General: Skin is warm and dry.  Neurological:     Mental Status: He is alert.  Psychiatric:        Mood and Affect: Mood normal.     ED Results / Procedures / Treatments   Labs (all labs  ordered are listed, but only abnormal results are displayed) Labs Reviewed  COMPREHENSIVE METABOLIC PANEL - Abnormal; Notable for the following components:      Result Value   Calcium 8.4 (*)    Total Protein 6.3 (*)    Albumin 2.2 (*)    AST 7 (*)    All other components within normal limits  CBC - Abnormal; Notable for the following components:   RBC 3.51 (*)    Hemoglobin 8.3 (*)    HCT 27.0 (*)    MCV 76.9 (*)    MCH 23.6 (*)    RDW 21.4 (*)    Platelets 548 (*)    All other components within normal limits  LIPASE, BLOOD  URINALYSIS, ROUTINE W REFLEX MICROSCOPIC    EKG None  Radiology CT Abdomen Pelvis W Contrast  Result Date: 10/05/2021 CLINICAL DATA:  Concern for bowel  obstruction.  History of Crohn's. EXAM: CT ABDOMEN AND PELVIS WITH CONTRAST TECHNIQUE: Multidetector CT imaging of the abdomen and pelvis was performed using the standard protocol following bolus administration of intravenous contrast. RADIATION DOSE REDUCTION: This exam was performed according to the departmental dose-optimization program which includes automated exposure control, adjustment of the mA and/or kV according to patient size and/or use of iterative reconstruction technique. CONTRAST:  100 cc Isovue 370 COMPARISON:  CT abdomen pelvis dated 05/14/2019. FINDINGS: Lower chest: The visualized lung bases are clear. No intra-abdominal free air. Small ascites, increased since the prior CT. Hepatobiliary: No focal liver abnormality is seen. No gallstones, gallbladder wall thickening, or biliary dilatation. Pancreas: Unremarkable. No pancreatic ductal dilatation or surrounding inflammatory changes. Spleen: Indeterminate subcentimeter splenic hypodense lesion, possibly a cyst or hemangioma. The spleen is otherwise unremarkable. Adrenals/Urinary Tract: The adrenal glands unremarkable. The kidneys, visualized ureters, and urinary bladder appear unremarkable. Stomach/Bowel: There is inflammatory changes and thickening of several loops of small bowel in the lower abdomen in keeping with Crohn's disease. There is focal area of tethering of adjacent loops of small bowel in the right lower quadrant consistent with adhesions. Evaluation for possible fistula is limited due to extensive adhesion and in the absence of oral contrast. There is high-grade narrowing of the bowel at the level of the adhesion with dilatation of the segment of the bowel proximal to the adhesion measuring up to 5.5 cm in caliber. Additionally there is loss of fat plane between a dilated loop of bowel and sigmoid colon likely representing adhesion. The appendix is not identified with certainty. Vascular/Lymphatic: The abdominal aorta and IVC are  unremarkable. No portal venous gas. There is no adenopathy. Reproductive: The prostate and seminal vesicles are grossly unremarkable. No pelvic mass. Other: None Musculoskeletal: No acute or significant osseous findings. IMPRESSION: 1. Active Crohn's disease with adhesion of loops of small bowel in the right lower quadrant. There is associated narrowing and obstruction of small bowel at the level of adhesion. 2. Small ascites, increased since the prior CT. Electronically Signed   By: Anner Crete M.D.   On: 10/05/2021 21:46   DG Abdomen 1 View  Result Date: 10/05/2021 CLINICAL DATA:  History of Crohn's abdomen pain EXAM: ABDOMEN - 1 VIEW COMPARISON:  CT 05/14/2019 FINDINGS: Air-filled distended central small bowel up to 5.5 cm. Scattered colon gas. No abnormal calcification. IMPRESSION: Air distended central small bowel which could be due to partial obstruction or ileus. Electronically Signed   By: Donavan Foil M.D.   On: 10/05/2021 18:13    Procedures Procedures    Medications Ordered in  ED Medications  iohexol (OMNIPAQUE) 300 MG/ML solution 100 mL (100 mLs Intravenous Contrast Given 10/05/21 2137)  HYDROmorphone (DILAUDID) injection 0.5 mg (0.5 mg Intravenous Given 10/06/21 0236)    ED Course/ Medical Decision Making/ A&P                           Medical Decision Making Amount and/or Complexity of Data Reviewed Labs: ordered.  Risk Prescription drug management. Decision regarding hospitalization.   This patient presents to the ED for concern of abdominal pain, this involves an extensive number of treatment options, and is a complaint that carries with it a high risk of complications and morbidity.  The differential diagnosis includes bowel obstruction, diverticulitis, bowel perforation    Additional history obtained:  Additional history obtained from partner at bedside External records from outside source obtained and reviewed including previous GI notes, procedural  notes   Co morbidities that complicate the patient evaluation  Crohn's disease  Social Determinants of Health:  No primary care provider    Lab Tests:  I Ordered, and personally interpreted labs.  The pertinent results include: CBC shows microcytic anemia hemoglobin of 8.3, CMP shows calcium 8.4 total protein 6.3 albumin 2.2 AST 7 UA is unremarkable   Imaging Studies ordered:  I ordered imaging studies including CT abdomen pelvis I independently visualized and interpreted imaging which showed shows small bowel obstruction I agree with the radiologist interpretation   Cardiac Monitoring:  The patient was maintained on a cardiac monitor.  I personally viewed and interpreted the cardiac monitored which showed an underlying rhythm of: N/A   Medicines ordered and prescription drug management:  I ordered medication including Dilaudid for pain I have reviewed the patients home medicines and have made adjustments as needed  Critical Interventions:  N/A   Reevaluation:  Presents with abdominal pain, triage obtained lab work and imaging which I personally reviewed, imaging is concerning for bowel obstruction, will consult with general surgery for further recommendations.  Updated patient recommendation from general surgery agreed this plan will consult hospitalist team for admission.  Consultations Obtained:  I requested consultation with the general surgery Dr. Bobbye Morton  and discussed lab and imaging findings as well as pertinent plan - they recommend: Hospital admission, bowel rest, we will for consultation if needed Spoke with Dr. Hal Hope will admit the patient.   Test Considered:   will defer on NG tube at this time as she is not nauseous has not vomited,    Rule out low suspicion for lower lobe pneumonia as lung sounds are clear bilaterally, will defer imaging at this time.  I have low suspicion for liver or gallbladder abnormality as he has no right upper  quadrant tenderness, liver enzymes, alk phos, T bili all within normal limits.  Low suspicion for pancreatitis as lipase is within normal limits.  Low suspicion for ruptured stomach ulcer as she has no peritoneal sign present on exam.   Low suspicion for complicated diverticulitis as she is nontoxic-appearing, vital signs reassuring no leukocytosis present.  Low suspicion for appendicitis as she has no right lower quadrant tenderness, vital signs reassuring.  I have low suspicion for AAA or dissection as presentation atypical, presentation more consistent with likely obstruction seen on CT imaging.   Dispostion and problem list  After consideration of the diagnostic results and the patients response to treatment, I feel that the patent would benefit from admission.  Bowel obstruction-likely secondary due to adhesions from Crohn's  disease, recommend bowel rest, and restarting patient on steroids as well as Humira for Crohn's disease.             Final Clinical Impression(s) / ED Diagnoses Final diagnoses:  Small bowel obstruction [facility])    Rx / DC Orders ED Discharge Orders     None         Marcello Fennel, PA-C 10/06/21 5809    Ripley Fraise, MD 10/06/21 2320

## 2021-10-07 ENCOUNTER — Telehealth: Payer: Self-pay

## 2021-10-07 DIAGNOSIS — K56609 Unspecified intestinal obstruction, unspecified as to partial versus complete obstruction: Secondary | ICD-10-CM | POA: Diagnosis not present

## 2021-10-07 DIAGNOSIS — E538 Deficiency of other specified B group vitamins: Secondary | ICD-10-CM | POA: Diagnosis not present

## 2021-10-07 DIAGNOSIS — K50012 Crohn's disease of small intestine with intestinal obstruction: Secondary | ICD-10-CM | POA: Diagnosis not present

## 2021-10-07 DIAGNOSIS — K50819 Crohn's disease of both small and large intestine with unspecified complications: Secondary | ICD-10-CM | POA: Diagnosis not present

## 2021-10-07 LAB — CBC
HCT: 26.6 % — ABNORMAL LOW (ref 39.0–52.0)
Hemoglobin: 8.3 g/dL — ABNORMAL LOW (ref 13.0–17.0)
MCH: 23.4 pg — ABNORMAL LOW (ref 26.0–34.0)
MCHC: 31.2 g/dL (ref 30.0–36.0)
MCV: 74.9 fL — ABNORMAL LOW (ref 80.0–100.0)
Platelets: 508 10*3/uL — ABNORMAL HIGH (ref 150–400)
RBC: 3.55 MIL/uL — ABNORMAL LOW (ref 4.22–5.81)
RDW: 21.1 % — ABNORMAL HIGH (ref 11.5–15.5)
WBC: 4.8 10*3/uL (ref 4.0–10.5)
nRBC: 0 % (ref 0.0–0.2)

## 2021-10-07 LAB — RETICULOCYTES
Immature Retic Fract: 24.5 % — ABNORMAL HIGH (ref 2.3–15.9)
RBC.: 4.05 MIL/uL — ABNORMAL LOW (ref 4.22–5.81)
Retic Count, Absolute: 61.2 10*3/uL (ref 19.0–186.0)
Retic Ct Pct: 1.5 % (ref 0.4–3.1)

## 2021-10-07 LAB — COMPREHENSIVE METABOLIC PANEL
ALT: 10 U/L (ref 0–44)
AST: 11 U/L — ABNORMAL LOW (ref 15–41)
Albumin: 2.5 g/dL — ABNORMAL LOW (ref 3.5–5.0)
Alkaline Phosphatase: 44 U/L (ref 38–126)
Anion gap: 8 (ref 5–15)
BUN: 7 mg/dL (ref 6–20)
CO2: 25 mmol/L (ref 22–32)
Calcium: 9.2 mg/dL (ref 8.9–10.3)
Chloride: 105 mmol/L (ref 98–111)
Creatinine, Ser: 0.87 mg/dL (ref 0.61–1.24)
GFR, Estimated: >60 mL/min (ref >60)
Glucose, Bld: 94 mg/dL (ref 70–99)
Potassium: 3.8 mmol/L (ref 3.5–5.1)
Sodium: 138 mmol/L (ref 135–145)
Total Bilirubin: 0.4 mg/dL (ref 0.3–1.2)
Total Protein: 7.5 g/dL (ref 6.5–8.1)

## 2021-10-07 LAB — IRON AND TIBC
Iron: 38 ug/dL — ABNORMAL LOW (ref 45–182)
Saturation Ratios: 14 % — ABNORMAL LOW (ref 17.9–39.5)
TIBC: 281 ug/dL (ref 250–450)
UIBC: 243 ug/dL

## 2021-10-07 LAB — HEPATITIS B SURFACE ANTIGEN: Hepatitis B Surface Ag: NONREACTIVE

## 2021-10-07 LAB — FERRITIN: Ferritin: 102 ng/mL (ref 24–336)

## 2021-10-07 LAB — VITAMIN B12: Vitamin B-12: 160 pg/mL — ABNORMAL LOW (ref 180–914)

## 2021-10-07 LAB — PREALBUMIN: Prealbumin: 18.4 mg/dL (ref 18–38)

## 2021-10-07 LAB — GLUCOSE, CAPILLARY
Glucose-Capillary: 104 mg/dL — ABNORMAL HIGH (ref 70–99)
Glucose-Capillary: 114 mg/dL — ABNORMAL HIGH (ref 70–99)
Glucose-Capillary: 76 mg/dL (ref 70–99)
Glucose-Capillary: 78 mg/dL (ref 70–99)

## 2021-10-07 LAB — HEPATITIS B SURFACE ANTIBODY,QUALITATIVE: Hep B S Ab: REACTIVE — AB

## 2021-10-07 LAB — MAGNESIUM: Magnesium: 1.9 mg/dL (ref 1.7–2.4)

## 2021-10-07 LAB — FOLATE: Folate: 6.3 ng/mL (ref >5.9)

## 2021-10-07 LAB — HEPATITIS C ANTIBODY: HCV Ab: NONREACTIVE

## 2021-10-07 MED ORDER — VITAMIN B-12 1000 MCG PO TABS
1000.0000 ug | ORAL_TABLET | Freq: Every day | ORAL | Status: DC
  Administered 2021-10-08: 1000 ug via ORAL
  Filled 2021-10-07: qty 1

## 2021-10-07 MED ORDER — CYANOCOBALAMIN 1000 MCG/ML IJ SOLN
1000.0000 ug | Freq: Once | INTRAMUSCULAR | Status: AC
  Administered 2021-10-07: 1000 ug via INTRAMUSCULAR
  Filled 2021-10-07: qty 1

## 2021-10-07 MED ORDER — ADULT MULTIVITAMIN W/MINERALS CH
1.0000 | ORAL_TABLET | Freq: Every day | ORAL | Status: DC
  Administered 2021-10-07 – 2021-10-08 (×2): 1 via ORAL
  Filled 2021-10-07 (×2): qty 1

## 2021-10-07 MED ORDER — THIAMINE HCL 100 MG PO TABS
100.0000 mg | ORAL_TABLET | Freq: Every day | ORAL | Status: DC
  Administered 2021-10-07 – 2021-10-08 (×2): 100 mg via ORAL
  Filled 2021-10-07 (×2): qty 1

## 2021-10-07 MED ORDER — SODIUM CHLORIDE 0.9 % IV SOLN
250.0000 mg | Freq: Every day | INTRAVENOUS | Status: DC
  Administered 2021-10-07: 250 mg via INTRAVENOUS
  Filled 2021-10-07 (×2): qty 20

## 2021-10-07 MED ORDER — ENSURE ENLIVE PO LIQD
237.0000 mL | Freq: Two times a day (BID) | ORAL | Status: DC
  Administered 2021-10-07 – 2021-10-08 (×2): 237 mL via ORAL

## 2021-10-07 NOTE — Progress Notes (Signed)
Initial Nutrition Assessment  DOCUMENTATION CODES:   Non-severe (moderate) malnutrition in context of chronic illness  INTERVENTION:  Encourage adequate PO intake Ensure Enlive po BID, each supplement provides 350 kcal and 20 grams of protein. Monitor magnesium, potassium, and phosphorus BID for at least 3 days, MD to replete as needed, as pt is at risk for refeeding syndrome given limited nutrition intake. Recommend thiamine d/t refeeding risk; MD to order MVI with minerals daily Referral to outpatient Dietitian at [place] labs: Vitamin C, thiamine, zinc Provided "Inflammatory Bowel Disease (IBD): Crohn's Disease And Ulcerative Colitis Nutrition Therapy" handout  NUTRITION DIAGNOSIS:   Moderate Malnutrition related to chronic illness (crohn's disease) as evidenced by mild fat depletion, mild muscle depletion  GOAL:   Patient will meet greater than or equal to 90% of their needs  MONITOR:   PO intake, Supplement acceptance, Labs, Weight trends  REASON FOR ASSESSMENT:   Consult Assessment of nutrition requirement/status, Diet education  ASSESSMENT:   Pt admitted from home with abdominal pain d/t SBO secondary to Crohn's exacerbation. PMH significant for Crohn's disease and Vitamin B12 deficiency.  Spoke with pt and his girlfriend at bedside. He states that he has had Crohn's for about the last 13 years and has had difficulty finding foods he is able to tolerate. He did not provide a detailed list of foods he eats other than chicken, [place], rice and seamoss gel which he adds to his beverages. He reports that he is unable to tolerate gluten containing foods, spicy foods, most fruits and vegetables and sodas and avoids high fiber containing foods. He has been tolerating clear liquid diet well during admission and feels ready for regular foods. Noted his diet has been advanced to a soft diet prior to assessment but has not received a regular tray yet.    Discussed  importance of eating a variety as they all provide specific benefits for overall health. Encouraged him to include well cooked vegetables, berries and canned fruits as these are generally lower in fiber and may help prevent additional stomach pain. Spoke with him about adding in a protein supplement to enhance nutritional intake given restricted diet as well as a daily multivitamin. He also appreciated recommendation for outpatient diet education.   Pt noted to have had several BM's during admission. Some loose but progressively more formed throughout his stay. He states that at home he usually encounters constipation. Suspect pt may have nutritional deficiencies d/t limited intake and probable malabsorption d/t long h/o Crohn's. He is agreeable to have micronutrient labs checked.   Unfortunately, there is limited documentation of wt hx. Within the last 2 years pt's wt appears to have decreased from 185 lbs to 145 lbs, however uncertain if this was a rapid or gradual wt loss. Will continue to monitor trends.  Medications: solu-medrol, Vitamin B12, IV ferric gluconate  Labs: sodium 138, potassium 3.8, AST 11 (L), iron 38 (L), Vitamin B12 (L)   NUTRITION - FOCUSED PHYSICAL EXAM:  Flowsheet Row Most Recent Value  Orbital Region No depletion  Upper Arm Region Mild depletion  Thoracic and Lumbar Region Mild depletion  Buccal Region No depletion  Temple Region No depletion  Clavicle Bone Region Moderate depletion  Clavicle and Acromion Bone Region Mild depletion  Scapular Bone Region No depletion  Dorsal Hand No depletion  Patellar Region Mild depletion  Anterior Thigh Region Mild depletion  Posterior Calf Region No depletion  Edema (RD Assessment) None  Hair Reviewed  Eyes Reviewed  Mouth  Reviewed  Skin Reviewed  Nails Reviewed       Diet Order:   Diet Order             DIET SOFT Room service appropriate? Yes; Fluid consistency: Thin  Diet effective now                    EDUCATION NEEDS:   Education needs have been addressed  Skin:  Skin Assessment: Reviewed RN Assessment  Last BM:  6/14 (type 3)  Height:   Ht Readings from Last 1 Encounters:  10/06/21 5' 11"  (1.803 m)    Weight:   Wt Readings from Last 1 Encounters:  10/06/21 65.7 kg   BMI:  Body mass index is 20.2 kg/m.  Estimated Nutritional Needs:   Kcal:  2000-2200  Protein:  85-100g  Fluid:  >/=2.0L  Clayborne Dana, RDN, LDN Clinical Nutrition

## 2021-10-07 NOTE — Progress Notes (Addendum)
Daily Rounding Note  10/07/2021, 1:15 PM  LOS: 1 day   SUBJECTIVE:   Chief complaint: SBO.  Crohns ileitis      Tolerated full liquids at lunch today.  Abdominal pain, nausea, vomiting resolved.  Has had several BMs, most of them soft but the latest was a formed stool.  No blood.  OBJECTIVE:         Vital signs in last 24 hours:    Temp:  [97.5 F (36.4 C)-97.9 F (36.6 C)] 97.9 F (36.6 C) (06/14 0952) Pulse Rate:  [62-71] 62 (06/14 0952) Resp:  [17-18] 17 (06/14 0952) BP: (108-134)/(73-100) 134/86 (06/14 0952) SpO2:  [99 %-100 %] 100 % (06/14 0952) Last BM Date : 10/07/21 Filed Weights   10/06/21 0802  Weight: 65.7 kg   General: Comfortable, looks well. Heart: RRR. Chest: No labored breathing or cough. Abdomen: Not tender.  Not distended.  Active bowel sounds.  Soft. Extremities: No CCE. Neuro/Psych: Pleasant, calm.  Fluid speech.  No tremors or obvious deficits.  Intake/Output from previous day: 06/13 0701 - 06/14 0700 In: 3148.9 [P.O.:960; I.V.:2188.9] Out: 0   Intake/Output this shift: No intake/output data recorded.  Lab Results: Recent Labs    10/05/21 1945 10/06/21 0442 10/07/21 0551  WBC 8.0 5.3 4.8  HGB 8.3* 7.3* 8.3*  HCT 27.0* 23.2* 26.6*  PLT 548* 432* 508*   BMET Recent Labs    10/05/21 1945 10/06/21 0442 10/07/21 0419  NA 137 138 138  K 3.9 3.9 3.8  CL 106 109 105  CO2 25 25 25   GLUCOSE 96 83 94  BUN 15 12 7   CREATININE 1.07 0.86 0.87  CALCIUM 8.4* 8.3* 9.2   LFT Recent Labs    10/05/21 1945 10/06/21 0442 10/07/21 0419  PROT 6.3* 5.6* 7.5  ALBUMIN 2.2* 1.9* 2.5*  AST 7* 7* 11*  ALT 7 7 10   ALKPHOS 41 33* 44  BILITOT 0.4 0.2* 0.4   PT/INR No results for input(s): "LABPROT", "INR" in the last 72 hours. Hepatitis Panel Recent Labs    10/07/21 0419  HEPBSAG NON REACTIVE  HCVAB NON REACTIVE    Studies/Results: CT Abdomen Pelvis W Contrast  Result Date:  10/05/2021 CLINICAL DATA:  Concern for bowel obstruction.  History of Crohn's. EXAM: CT ABDOMEN AND PELVIS WITH CONTRAST TECHNIQUE: Multidetector CT imaging of the abdomen and pelvis was performed using the standard protocol following bolus administration of intravenous contrast. RADIATION DOSE REDUCTION: This exam was performed according to the departmental dose-optimization program which includes automated exposure control, adjustment of the mA and/or kV according to patient size and/or use of iterative reconstruction technique. CONTRAST:  100 cc Isovue 370 COMPARISON:  CT abdomen pelvis dated 05/14/2019. FINDINGS: Lower chest: The visualized lung bases are clear. No intra-abdominal free air. Small ascites, increased since the prior CT. Hepatobiliary: No focal liver abnormality is seen. No gallstones, gallbladder wall thickening, or biliary dilatation. Pancreas: Unremarkable. No pancreatic ductal dilatation or surrounding inflammatory changes. Spleen: Indeterminate subcentimeter splenic hypodense lesion, possibly a cyst or hemangioma. The spleen is otherwise unremarkable. Adrenals/Urinary Tract: The adrenal glands unremarkable. The kidneys, visualized ureters, and urinary bladder appear unremarkable. Stomach/Bowel: There is inflammatory changes and thickening of several loops of small bowel in the lower abdomen in keeping with Crohn's disease. There is focal area of tethering of adjacent loops of small bowel in the right lower quadrant consistent with adhesions. Evaluation for possible fistula is limited due to extensive adhesion and  in the absence of oral contrast. There is high-grade narrowing of the bowel at the level of the adhesion with dilatation of the segment of the bowel proximal to the adhesion measuring up to 5.5 cm in caliber. Additionally there is loss of fat plane between a dilated loop of bowel and sigmoid colon likely representing adhesion. The appendix is not identified with certainty.  Vascular/Lymphatic: The abdominal aorta and IVC are unremarkable. No portal venous gas. There is no adenopathy. Reproductive: The prostate and seminal vesicles are grossly unremarkable. No pelvic mass. Other: None Musculoskeletal: No acute or significant osseous findings. IMPRESSION: 1. Active Crohn's disease with adhesion of loops of small bowel in the right lower quadrant. There is associated narrowing and obstruction of small bowel at the level of adhesion. 2. Small ascites, increased since the prior CT. Electronically Signed   By: Anner Crete M.D.   On: 10/05/2021 21:46   DG Abdomen 1 View  Result Date: 10/05/2021 CLINICAL DATA:  History of Crohn's abdomen pain EXAM: ABDOMEN - 1 VIEW COMPARISON:  CT 05/14/2019 FINDINGS: Air-filled distended central small bowel up to 5.5 cm. Scattered colon gas. No abnormal calcification. IMPRESSION: Air distended central small bowel which could be due to partial obstruction or ileus. Electronically Signed   By: Donavan Foil M.D.   On: 10/05/2021 18:13    Scheduled Meds:  cyanocobalamin  1,000 mcg Intramuscular Once   heparin  5,000 Units Subcutaneous Q8H   methylPREDNISolone (SOLU-MEDROL) injection  60 mg Intravenous Daily   [START ON 10/08/2021] vitamin B-12  1,000 mcg Oral Daily   Continuous Infusions: PRN Meds:.acetaminophen **OR** acetaminophen, HYDROmorphone (DILAUDID) injection  ASSESMENT:   Crohn's ileitis with SBO.  Has been off Crohn's meds, Humira, for last few years.  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  Symptoms resolved with initiation of IV Solu-Medrol day 2 tolerating full liquids.  Microcytic anemia, asymptomatic, chronic.  Low iron, low iron sats, low B12, normal folate.  IM B12 administered.   PLAN   Advance to solid food.  ?timing of switching to oral prednisone 40 mg daily?  Suspect he can go home tomorrow.   Ordered Ferrlecit today, and perhaps can get a second dose tomorrow before he  discharges.  Continue oral B12 for now but patient may have difficulty absorbing the B12 in the setting of small bowel Crohn's and may need to convert to scheduled IM injections.  Note that patient received a dismissal from [facility] practice, Dr. Fuller Plan in 2012.  ? Will Dr Carlean Purl agree to re-accpt pt?  Azucena Freed  10/07/2021, 1:15 PM Phone [phone]   GI Attending:   Significantly better - to start solid food (soft) - he should stick to a soft low fiber diet Agree should be able to go home tomorrow. We discussed prior dismissals from [facility] and [facility] - he said he was different then - younger and in trouble with law. Asked for me to provide outpatient care. I have agreed to accept him as a patient. 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  We will not see him tomorrow unless you ask.  Gatha Mayer, MD, [facility] See Shea Evans on call - gastroenterology for best contact person 10/07/2021 4:30 PM

## 2021-10-07 NOTE — Discharge Instructions (Addendum)

## 2021-10-07 NOTE — Plan of Care (Signed)
  Problem: Nutrition: Goal: Adequate nutrition will be maintained Outcome: Progressing   Problem: Activity: Goal: Risk for activity intolerance will decrease Outcome: Progressing   Problem: Clinical Measurements: Goal: Ability to maintain clinical measurements within normal limits will improve Outcome: Progressing   Problem: Health Behavior/Discharge Planning: Goal: Ability to manage health-related needs will improve Outcome: Progressing   Problem: Education: Goal: Knowledge of General Education information will improve Description: Including pain rating scale, medication(s)/side effects and non-pharmacologic comfort measures Outcome: Progressing

## 2021-10-07 NOTE — Progress Notes (Addendum)
PROGRESS NOTE    Shane Klein  FOY:[id number] DOB: [birth year] DOA: 10/05/2021 PCP: Pcp, No    Brief Narrative:   Shane Klein is a 31 y.o. male with past medical history of Crohn's disease presented to the hospital with abdominal pain for the last 3 days mostly in the lower quadrants.  He did have some nausea and vomiting.  In the ED, CT scan of the abdomen pelvis was concerning for bowel obstruction with Crohn's exacerbation.  General surgery and GI was consulted and patient was admitted hospital for further evaluation and treatment.   Assessment and Plan.  Principal Problem:   SBO (small bowel obstruction) (HCC) Active Problems:   CROHN'S DISEASE-LARGE & SMALL INTESTINE   Vitamin B12 deficiency   Small bowel obstruction secondary to Crohn's exacerbation.  History of Crohn's disease with recurrent SBO. Currently on clears..  Continue IV fluids and analgesics.  GI on board.  Hepatitis B surface antibody reactive.  HCV antibody nonreactive.  Hepatitis B surface antigen nonreactive.  Folic acid of 6.3 with vitamin B12 low at 160.  Iron low at 38 with low saturation of 14.  Is currently on IV Solu-Medrol as per GI.  We will likely need Biologics as outpatient..  Seen by general surgery and recommended advance diet as tolerated.  Will advance to full liquids today.   Chronic anemia likely from chronic disease we will continue to follow CBC.  Vitamin B12 deficiency.  We will give 1 dose of parenteral vitamin B12 followed by oral daily.    DVT prophylaxis: heparin injection 5,000 Units Start: 10/06/21 0600   Code Status:     Code Status: Full Code  Disposition: Home likely in 1 to 2 days.  Status is: Inpatient  Remains inpatient appropriate because: IV steroids, GI follow-up   Family Communication: Communicated with the patient's family at bedside  Consultants:  GI General surgery  Procedures:  None  Antimicrobials:  None  Anti-infectives (From admission, onward)     None       Subjective: Today, patient was seen and examined at bedside.  Patient states that he feels better.  Less abdominal pain no nausea vomiting or.  Has had bowel movement which has slightly formed this morning  Objective: Vitals:   10/06/21 1651 10/06/21 2133 10/07/21 0545 10/07/21 0952  BP: (!) 122/100 109/79 108/73 134/86  Pulse: 71 63 66 62  Resp: 18 18 18 17   Temp: 97.7 F (36.5 C) (!) 97.5 F (36.4 C) 97.6 F (36.4 C) 97.9 F (36.6 C)  TempSrc: Oral Oral Oral   SpO2: 100% 99% 100% 100%  Weight:      Height:        Intake/Output Summary (Last 24 hours) at 10/07/2021 1059 Last data filed at 10/07/2021 0600 Gross per 24 hour  Intake 3148.93 ml  Output 0 ml  Net 3148.93 ml   Filed Weights   10/06/21 0802  Weight: 65.7 kg    Physical Examination: Body mass index is 20.2 kg/m.  General:  Average built, not in obvious distress HENT:   No scleral pallor or icterus noted. Oral mucosa is moist.  Chest:  Clear breath sounds.  Diminished breath sounds bilaterally. No crackles or wheezes.  CVS: S1 &S2 heard. No murmur.  Regular rate and rhythm. Abdomen: Soft, nonspecific tenderness on palpation nondistended.  Bowel sounds are heard.   Extremities: No cyanosis, clubbing or edema.  Peripheral pulses are palpable. Psych: Alert, awake and oriented, normal mood CNS:  No cranial  nerve deficits.  Power equal in all extremities.   Skin: Warm and dry.  No rashes noted.  Data Reviewed:   CBC: Recent Labs  Lab 10/05/21 1945 10/06/21 0442 10/07/21 0551  WBC 8.0 5.3 4.8  HGB 8.3* 7.3* 8.3*  HCT 27.0* 23.2* 26.6*  MCV 76.9* 75.8* 74.9*  PLT 548* 432* 508*    Basic Metabolic Panel: Recent Labs  Lab 10/05/21 1945 10/06/21 0442 10/07/21 0419  NA 137 138 138  K 3.9 3.9 3.8  CL 106 109 105  CO2 25 25 25   GLUCOSE 96 83 94  BUN 15 12 7   CREATININE 1.07 0.86 0.87  CALCIUM 8.4* 8.3* 9.2  MG  --   --  1.9    Liver Function Tests: Recent Labs  Lab  10/05/21 1945 10/06/21 0442 10/07/21 0419  AST 7* 7* 11*  ALT 7 7 10   ALKPHOS 41 33* 44  BILITOT 0.4 0.2* 0.4  PROT 6.3* 5.6* 7.5  ALBUMIN 2.2* 1.9* 2.5*     Radiology Studies: CT Abdomen Pelvis W Contrast  Result Date: 10/05/2021 CLINICAL DATA:  Concern for bowel obstruction.  History of Crohn's. EXAM: CT ABDOMEN AND PELVIS WITH CONTRAST TECHNIQUE: Multidetector CT imaging of the abdomen and pelvis was performed using the standard protocol following bolus administration of intravenous contrast. RADIATION DOSE REDUCTION: This exam was performed according to the departmental dose-optimization program which includes automated exposure control, adjustment of the mA and/or kV according to patient size and/or use of iterative reconstruction technique. CONTRAST:  100 cc Isovue 370 COMPARISON:  CT abdomen pelvis dated 05/14/2019. FINDINGS: Lower chest: The visualized lung bases are clear. No intra-abdominal free air. Small ascites, increased since the prior CT. Hepatobiliary: No focal liver abnormality is seen. No gallstones, gallbladder wall thickening, or biliary dilatation. Pancreas: Unremarkable. No pancreatic ductal dilatation or surrounding inflammatory changes. Spleen: Indeterminate subcentimeter splenic hypodense lesion, possibly a cyst or hemangioma. The spleen is otherwise unremarkable. Adrenals/Urinary Tract: The adrenal glands unremarkable. The kidneys, visualized ureters, and urinary bladder appear unremarkable. Stomach/Bowel: There is inflammatory changes and thickening of several loops of small bowel in the lower abdomen in keeping with Crohn's disease. There is focal area of tethering of adjacent loops of small bowel in the right lower quadrant consistent with adhesions. Evaluation for possible fistula is limited due to extensive adhesion and in the absence of oral contrast. There is high-grade narrowing of the bowel at the level of the adhesion with dilatation of the segment of the bowel  proximal to the adhesion measuring up to 5.5 cm in caliber. Additionally there is loss of fat plane between a dilated loop of bowel and sigmoid colon likely representing adhesion. The appendix is not identified with certainty. Vascular/Lymphatic: The abdominal aorta and IVC are unremarkable. No portal venous gas. There is no adenopathy. Reproductive: The prostate and seminal vesicles are grossly unremarkable. No pelvic mass. Other: None Musculoskeletal: No acute or significant osseous findings. IMPRESSION: 1. Active Crohn's disease with adhesion of loops of small bowel in the right lower quadrant. There is associated narrowing and obstruction of small bowel at the level of adhesion. 2. Small ascites, increased since the prior CT. Electronically Signed   By: Anner Crete M.D.   On: 10/05/2021 21:46   DG Abdomen 1 View  Result Date: 10/05/2021 CLINICAL DATA:  History of Crohn's abdomen pain EXAM: ABDOMEN - 1 VIEW COMPARISON:  CT 05/14/2019 FINDINGS: Air-filled distended central small bowel up to 5.5 cm. Scattered colon gas. No abnormal calcification. IMPRESSION:  Air distended central small bowel which could be due to partial obstruction or ileus. Electronically Signed   By: Donavan Foil M.D.   On: 10/05/2021 18:13      LOS: 1 day    Flora Lipps, MD [facility] Available via Epic secure chat 7am-7pm After these hours, please refer to coverage provider listed on [email or web address] 10/07/2021, 10:59 AM

## 2021-10-07 NOTE — Progress Notes (Addendum)
Subjective: Patient feels much better today.  Just had a BM. No nausea.  Tolerating clear liquids.  ROS: See above, otherwise other systems negative  Objective: Vital signs in last 24 hours: Temp:  [97.5 F (36.4 C)-98 F (36.7 C)] 97.6 F (36.4 C) (06/14 0545) Pulse Rate:  [63-71] 66 (06/14 0545) Resp:  [18] 18 (06/14 0545) BP: (108-122)/(71-100) 108/73 (06/14 0545) SpO2:  [99 %-100 %] 100 % (06/14 0545) Last BM Date : 10/06/21  Intake/Output from previous day: 06/13 0701 - 06/14 0700 In: 3148.9 [P.O.:960; I.V.:2188.9] Out: 0  Intake/Output this shift: No intake/output data recorded.  PE: Abd: soft, NT, ND, +BS  Lab Results:  Recent Labs    10/06/21 0442 10/07/21 0551  WBC 5.3 4.8  HGB 7.3* 8.3*  HCT 23.2* 26.6*  PLT 432* 508*   BMET Recent Labs    10/06/21 0442 10/07/21 0419  NA 138 138  K 3.9 3.8  CL 109 105  CO2 25 25  GLUCOSE 83 94  BUN 12 7  CREATININE 0.86 0.87  CALCIUM 8.3* 9.2   PT/INR No results for input(s): "LABPROT", "INR" in the last 72 hours. CMP     Component Value Date/Time   NA 138 10/07/2021 0419   K 3.8 10/07/2021 0419   CL 105 10/07/2021 0419   CO2 25 10/07/2021 0419   GLUCOSE 94 10/07/2021 0419   BUN 7 10/07/2021 0419   CREATININE 0.87 10/07/2021 0419   CALCIUM 9.2 10/07/2021 0419   PROT 7.5 10/07/2021 0419   ALBUMIN 2.5 (L) 10/07/2021 0419   AST 11 (L) 10/07/2021 0419   ALT 10 10/07/2021 0419   ALKPHOS 44 10/07/2021 0419   BILITOT 0.4 10/07/2021 0419   GFRNONAA >60 10/07/2021 0419   GFRAA >60 07/17/2019 2052   Lipase     Component Value Date/Time   LIPASE 33 10/05/2021 1945       Studies/Results: CT Abdomen Pelvis W Contrast  Result Date: 10/05/2021 CLINICAL DATA:  Concern for bowel obstruction.  History of Crohn's. EXAM: CT ABDOMEN AND PELVIS WITH CONTRAST TECHNIQUE: Multidetector CT imaging of the abdomen and pelvis was performed using the standard protocol following bolus administration of  intravenous contrast. RADIATION DOSE REDUCTION: This exam was performed according to the departmental dose-optimization program which includes automated exposure control, adjustment of the mA and/or kV according to patient size and/or use of iterative reconstruction technique. CONTRAST:  100 cc Isovue 370 COMPARISON:  CT abdomen pelvis dated 05/14/2019. FINDINGS: Lower chest: The visualized lung bases are clear. No intra-abdominal free air. Small ascites, increased since the prior CT. Hepatobiliary: No focal liver abnormality is seen. No gallstones, gallbladder wall thickening, or biliary dilatation. Pancreas: Unremarkable. No pancreatic ductal dilatation or surrounding inflammatory changes. Spleen: Indeterminate subcentimeter splenic hypodense lesion, possibly a cyst or hemangioma. The spleen is otherwise unremarkable. Adrenals/Urinary Tract: The adrenal glands unremarkable. The kidneys, visualized ureters, and urinary bladder appear unremarkable. Stomach/Bowel: There is inflammatory changes and thickening of several loops of small bowel in the lower abdomen in keeping with Crohn's disease. There is focal area of tethering of adjacent loops of small bowel in the right lower quadrant consistent with adhesions. Evaluation for possible fistula is limited due to extensive adhesion and in the absence of oral contrast. There is high-grade narrowing of the bowel at the level of the adhesion with dilatation of the segment of the bowel proximal to the adhesion measuring up to 5.5 cm in caliber. Additionally there is loss of  fat plane between a dilated loop of bowel and sigmoid colon likely representing adhesion. The appendix is not identified with certainty. Vascular/Lymphatic: The abdominal aorta and IVC are unremarkable. No portal venous gas. There is no adenopathy. Reproductive: The prostate and seminal vesicles are grossly unremarkable. No pelvic mass. Other: None Musculoskeletal: No acute or significant osseous  findings. IMPRESSION: 1. Active Crohn's disease with adhesion of loops of small bowel in the right lower quadrant. There is associated narrowing and obstruction of small bowel at the level of adhesion. 2. Small ascites, increased since the prior CT. Electronically Signed   By: Anner Crete M.D.   On: 10/05/2021 21:46   DG Abdomen 1 View  Result Date: 10/05/2021 CLINICAL DATA:  History of Crohn's abdomen pain EXAM: ABDOMEN - 1 VIEW COMPARISON:  CT 05/14/2019 FINDINGS: Air-filled distended central small bowel up to 5.5 cm. Scattered colon gas. No abnormal calcification. IMPRESSION: Air distended central small bowel which could be due to partial obstruction or ileus. Electronically Signed   By: Donavan Foil M.D.   On: 10/05/2021 18:13    Anti-infectives: Anti-infectives (From admission, onward)    None        Assessment/Plan SBO in setting of crohn's disease -steroids per GI -likely to start biologic after DC -clinically doing well -may adv diet from surgical standpoint -no plans for acute surgical intervention at this time. We are available as needed  FEN - CLD, may advance from my standpoint VTE - heparin ID - none  I reviewed Consultant GI notes, hospitalist notes, last 24 h vitals and pain scores, last 48 h intake and output, last 24 h labs and trends, and last 24 h imaging results.   LOS: 1 day    Henreitta Cea , [facility] 10/07/2021, 9:02 AM Please see Amion for pager number during day hours 7:00am-4:30pm or 7:00am -11:30am on weekends

## 2021-10-07 NOTE — Telephone Encounter (Signed)
Pt scheduled for an office visit with Dr. Carlean Purl on July 20 at 11:30: Pt will be made aware of appointment  date and time at time of discharge from Hospital:

## 2021-10-08 DIAGNOSIS — E440 Moderate protein-calorie malnutrition: Secondary | ICD-10-CM

## 2021-10-08 DIAGNOSIS — K50012 Crohn's disease of small intestine with intestinal obstruction: Secondary | ICD-10-CM

## 2021-10-08 DIAGNOSIS — K50819 Crohn's disease of both small and large intestine with unspecified complications: Secondary | ICD-10-CM | POA: Diagnosis not present

## 2021-10-08 DIAGNOSIS — K56609 Unspecified intestinal obstruction, unspecified as to partial versus complete obstruction: Secondary | ICD-10-CM | POA: Diagnosis not present

## 2021-10-08 LAB — PHOSPHORUS: Phosphorus: 3.8 mg/dL (ref 2.5–4.6)

## 2021-10-08 LAB — GLUCOSE, CAPILLARY: Glucose-Capillary: 75 mg/dL (ref 70–99)

## 2021-10-08 MED ORDER — PANTOPRAZOLE SODIUM 40 MG PO TBEC: 40.0000 mg | DELAYED_RELEASE_TABLET | Freq: Every day | ORAL | 0 refills | Status: DC

## 2021-10-08 MED ORDER — PREDNISONE 10 MG PO TABS: ORAL_TABLET | ORAL | 0 refills | Status: AC

## 2021-10-08 MED ORDER — THIAMINE HCL 100 MG PO TABS: 100.0000 mg | ORAL_TABLET | Freq: Every day | ORAL | 2 refills | Status: DC

## 2021-10-08 MED ORDER — ENSURE ENLIVE PO LIQD: 237.0000 mL | Freq: Two times a day (BID) | ORAL | 0 refills | Status: AC

## 2021-10-08 MED ORDER — ADULT MULTIVITAMIN W/MINERALS CH: 1.0000 | ORAL_TABLET | Freq: Every day | ORAL | 2 refills | Status: DC

## 2021-10-08 MED ORDER — CYANOCOBALAMIN 1000 MCG PO TABS: 1000.0000 ug | ORAL_TABLET | Freq: Every day | ORAL | 2 refills | Status: DC

## 2021-10-08 NOTE — TOC Transition Note (Signed)
Transition of Care [facility]) - CM/SW Discharge Note   Patient Details  Name: Shane Klein MRN: [id number] Date of Birth: 16-Nov-1990  [facility] CM/SW Contact:  Tom-Johnson, Renea Ee, RN Phone Number: 10/08/2021, 10:10 AM   Clinical Narrative:     Patient is scheduled for discharge today. No PT/OT needs or recommendations noted. Patient denies any needs. Family to transport at discharge. No further TOC needs noted.  Final next level of care: Home/Self Care Barriers to Discharge: Barriers Resolved   Patient Goals and CMS Choice Patient states their goals for this hospitalization and ongoing recovery are:: Toreturn home CMS Medicare.gov Compare Post Acute Care list provided to:: Patient Choice offered to / list presented to : NA  Discharge Placement                Patient to be transferred to facility by: Family      Discharge Plan and Services In-house Referral: PCP / [facility] Discharge Planning Services: CM Consult, Follow-up appt scheduled Post Acute Care Choice: NA          DME Arranged: N/A DME Agency: NA       HH Arranged: NA HH Agency: NA        Social Determinants of Health (SDOH) Interventions     Readmission Risk Interventions     No data to display

## 2021-10-08 NOTE — Discharge Summary (Signed)
Physician Discharge Summary  Shane Klein GXQ:[id number] DOB: Feb 18, 1991 DOA: 10/05/2021  PCP: Pcp, No  Admit date: 10/05/2021 Discharge date: 10/08/2021  Admitted From: Home  Discharge disposition: Home  Recommendations for Outpatient Follow-Up:   Follow up with your primary care provider in one week.  Check CBC, BMP, liver function test, magnesium in the next visit.   Patient has been referred for nutrition services as outpatient Follow-up with Dr. Carlean Purl GI on July 20 at 11:30 AM to discuss about Biologics and further treatment of Crohn's disease.   Discharge Diagnosis:   Principal Problem:   SBO (small bowel obstruction) (HCC) Active Problems:   CROHN'S DISEASE-LARGE & SMALL INTESTINE   Crohn's ileitis, with intestinal obstruction (HCC)   Vitamin B12 deficiency   Malnutrition of moderate degree   Discharge Condition: Improved.  Diet recommendation:   Regular.  Wound care: None.  Code status: Full.   History of Present Illness:   Shane Klein is a 31 y.o. male with past medical history of Crohn's disease presented to the hospital with abdominal pain for the last 3 days mostly in the lower quadrants.  He did have some nausea and vomiting.  In the ED, CT scan of the abdomen pelvis was concerning for bowel obstruction with Crohn's exacerbation.  General surgery and GI was consulted and patient was admitted hospital for further evaluation and treatment.    Hospital Course:   Following conditions were addressed during hospitalization as listed below,  Small bowel obstruction secondary to Crohn's exacerbation.   Resolved at this time.  History of Crohn's disease with recurrent SBO. Patient was conservatively treated during hospitalization.  Patient was seen by GI.Marland Kitchen  Hepatitis B surface antibody reactive.  HCV antibody nonreactive.  Hepatitis B surface antigen nonreactive.  Folic acid of 6.3 with vitamin B12 low at 160.  Iron low at 38 with low saturation of 14.   Patient was given IV Solu-Medrol in hospitalization and will be changed to prednisone taper for a month on discharge.  Patient will follow-up with GI on November 12, 2021.  We will likely need Biologics as outpatient..  Patient was also seen by general surgery and recommended operative treatment.  At this time patient has been advanced on oral diet which he has tolerated.  Patient was seen by nutrition services during hospitalization.  We will continue multivitamin thiamine and vitamin B12 on discharge.   Chronic anemia likely from chronic disease.  Patient will be given vitamin B12 1 discharge   Vitamin B12 deficiency.  We will give 1 dose of parenteral vitamin B12 followed by oral daily.   Moderate protein calorie malnutrition.  Patient was seen by nutritional services during hospitalization received supplements during hospitalization will discharge home on multivitamin thiamine and vitamin B12  Disposition.  At this time, patient is stable for disposition home with outpatient PCP and GI follow-up.  Medical Consultants:   GI General surgery  Procedures:    None Subjective:   Today, patient was seen and examined at bedside.  Denies any nausea vomiting abdominal pain fever or chills.  Has had bowel movement.  Tolerating p.o. diet  Discharge Exam:   Vitals:   10/08/21 0545 10/08/21 0906  BP: 114/77 116/75  Pulse: 63 70  Resp: 18 18  Temp: 98.4 F (36.9 C) 98.2 F (36.8 C)  SpO2: 100% 100%   Vitals:   10/07/21 1629 10/07/21 2028 10/08/21 0545 10/08/21 0906  BP: 111/63 118/74 114/77 116/75  Pulse: 75 76 63 70  Resp: 18 18 18 18   Temp: 97.8 F (36.6 C) 98 F (36.7 C) 98.4 F (36.9 C) 98.2 F (36.8 C)  TempSrc:  Oral Oral Oral  SpO2: 100% 100% 100% 100%  Weight:      Height:        General: Alert awake, not in obvious distress HENT: pupils equally reacting to light,  No scleral pallor or icterus noted. Oral mucosa is moist.  Chest:  Clear breath sounds.  Diminished breath  sounds bilaterally. No crackles or wheezes.  CVS: S1 &S2 heard. No murmur.  Regular rate and rhythm. Abdomen: Soft, nontender, nondistended.  Bowel sounds are heard.   Extremities: No cyanosis, clubbing or edema.  Peripheral pulses are palpable. Psych: Alert, awake and oriented, normal mood CNS:  No cranial nerve deficits.  Power equal in all extremities.   Skin: Warm and dry.  No rashes noted.  The results of significant diagnostics from this hospitalization (including imaging, microbiology, ancillary and laboratory) are listed below for reference.     Diagnostic Studies:   CT Abdomen Pelvis W Contrast  Result Date: 10/05/2021 CLINICAL DATA:  Concern for bowel obstruction.  History of Crohn's. EXAM: CT ABDOMEN AND PELVIS WITH CONTRAST TECHNIQUE: Multidetector CT imaging of the abdomen and pelvis was performed using the standard protocol following bolus administration of intravenous contrast. RADIATION DOSE REDUCTION: This exam was performed according to the departmental dose-optimization program which includes automated exposure control, adjustment of the mA and/or kV according to patient size and/or use of iterative reconstruction technique. CONTRAST:  100 cc Isovue 370 COMPARISON:  CT abdomen pelvis dated 05/14/2019. FINDINGS: Lower chest: The visualized lung bases are clear. No intra-abdominal free air. Small ascites, increased since the prior CT. Hepatobiliary: No focal liver abnormality is seen. No gallstones, gallbladder wall thickening, or biliary dilatation. Pancreas: Unremarkable. No pancreatic ductal dilatation or surrounding inflammatory changes. Spleen: Indeterminate subcentimeter splenic hypodense lesion, possibly a cyst or hemangioma. The spleen is otherwise unremarkable. Adrenals/Urinary Tract: The adrenal glands unremarkable. The kidneys, visualized ureters, and urinary bladder appear unremarkable. Stomach/Bowel: There is inflammatory changes and thickening of several loops of small  bowel in the lower abdomen in keeping with Crohn's disease. There is focal area of tethering of adjacent loops of small bowel in the right lower quadrant consistent with adhesions. Evaluation for possible fistula is limited due to extensive adhesion and in the absence of oral contrast. There is high-grade narrowing of the bowel at the level of the adhesion with dilatation of the segment of the bowel proximal to the adhesion measuring up to 5.5 cm in caliber. Additionally there is loss of fat plane between a dilated loop of bowel and sigmoid colon likely representing adhesion. The appendix is not identified with certainty. Vascular/Lymphatic: The abdominal aorta and IVC are unremarkable. No portal venous gas. There is no adenopathy. Reproductive: The prostate and seminal vesicles are grossly unremarkable. No pelvic mass. Other: None Musculoskeletal: No acute or significant osseous findings. IMPRESSION: 1. Active Crohn's disease with adhesion of loops of small bowel in the right lower quadrant. There is associated narrowing and obstruction of small bowel at the level of adhesion. 2. Small ascites, increased since the prior CT. Electronically Signed   By: Anner Crete M.D.   On: 10/05/2021 21:46   DG Abdomen 1 View  Result Date: 10/05/2021 CLINICAL DATA:  History of Crohn's abdomen pain EXAM: ABDOMEN - 1 VIEW COMPARISON:  CT 05/14/2019 FINDINGS: Air-filled distended central small bowel up to 5.5 cm. Scattered colon gas.  No abnormal calcification. IMPRESSION: Air distended central small bowel which could be due to partial obstruction or ileus. Electronically Signed   By: Donavan Foil M.D.   On: 10/05/2021 18:13     Labs:   Basic Metabolic Panel: Recent Labs  Lab 10/05/21 1945 10/06/21 0442 10/07/21 0419 10/08/21 0422  NA 137 138 138  --   K 3.9 3.9 3.8  --   CL 106 109 105  --   CO2 25 25 25   --   GLUCOSE 96 83 94  --   BUN 15 12 7   --   CREATININE 1.07 0.86 0.87  --   CALCIUM 8.4* 8.3* 9.2   --   MG  --   --  1.9  --   PHOS  --   --   --  3.8   GFR Estimated Creatinine Clearance: 115.4 mL/min (by C-G formula based on SCr of 0.87 mg/dL). Liver Function Tests: Recent Labs  Lab 10/05/21 1945 10/06/21 0442 10/07/21 0419  AST 7* 7* 11*  ALT 7 7 10   ALKPHOS 41 33* 44  BILITOT 0.4 0.2* 0.4  PROT 6.3* 5.6* 7.5  ALBUMIN 2.2* 1.9* 2.5*   Recent Labs  Lab 10/05/21 1945  LIPASE 33   No results for input(s): "AMMONIA" in the last 168 hours. Coagulation profile No results for input(s): "INR", "PROTIME" in the last 168 hours.  CBC: Recent Labs  Lab 10/05/21 1945 10/06/21 0442 10/07/21 0551  WBC 8.0 5.3 4.8  HGB 8.3* 7.3* 8.3*  HCT 27.0* 23.2* 26.6*  MCV 76.9* 75.8* 74.9*  PLT 548* 432* 508*   Cardiac Enzymes: No results for input(s): "CKTOTAL", "CKMB", "CKMBINDEX", "TROPONINI" in the last 168 hours. BNP: Invalid input(s): "POCBNP" CBG: Recent Labs  Lab 10/07/21 0620 10/07/21 1149 10/07/21 1827 10/07/21 2353 10/08/21 0544  GLUCAP 76 78 114* 104* 75   D-Dimer No results for input(s): "DDIMER" in the last 72 hours. Hgb A1c No results for input(s): "HGBA1C" in the last 72 hours. Lipid Profile No results for input(s): "CHOL", "HDL", "LDLCALC", "TRIG", "CHOLHDL", "LDLDIRECT" in the last 72 hours. Thyroid function studies No results for input(s): "TSH", "T4TOTAL", "T3FREE", "THYROIDAB" in the last 72 hours.  Invalid input(s): "FREET3" Anemia work up Recent Labs    10/07/21 0419  VITAMINB12 160*  FOLATE 6.3  FERRITIN 102  TIBC 281  IRON 38*  RETICCTPCT 1.5   Microbiology No results found for this or any previous visit (from the past 240 hour(s)).   Discharge Instructions:   Discharge Instructions     Amb Referral to Nutrition and Diabetic Education   Complete by: As directed    Diet general   Complete by: As directed    Discharge instructions   Complete by: As directed    Follow-up with your primary care physician in 1 week.  Check blood  work at that time.  Follow-up with Dr. Carlean Purl GI  on July 20 at 11:30 AM.  Continue to take medications as prescribed.  Seek medical attention for worsening symptoms.   Increase activity slowly   Complete by: As directed       Allergies as of 10/08/2021       Reactions   Gluten Meal Other (See Comments)   Stomach pain   Wheat Bran Other (See Comments)   Belly pain        Medication List     TAKE these medications    acetaminophen 500 MG tablet Commonly known as: TYLENOL Take 1,000 mg  by mouth every 6 (six) hours as needed for moderate pain or headache.   cyanocobalamin 1000 MCG tablet Take 1 tablet (1,000 mcg total) by mouth daily.   feeding supplement Liqd Take 237 mLs by mouth 2 (two) times daily between meals.   multivitamin with minerals Tabs tablet Take 1 tablet by mouth daily.   pantoprazole 40 MG tablet Commonly known as: Protonix Take 1 tablet (40 mg total) by mouth daily.   predniSONE 10 MG tablet Commonly known as: DELTASONE Take 4 tablets (40 mg total) by mouth daily with breakfast for 7 days, THEN 3 tablets (30 mg total) daily with breakfast for 7 days, THEN 2 tablets (20 mg total) daily with breakfast for 7 days, THEN 1 tablet (10 mg total) daily with breakfast. Start taking on: October 08, 2021   thiamine 100 MG tablet Take 1 tablet (100 mg total) by mouth daily.        Time coordinating discharge: 39 minutes  Signed:  Mong Neal  [facility] 10/08/2021, 4:28 PM

## 2021-10-09 LAB — QUANTIFERON-TB GOLD PLUS (RQFGPL)
QuantiFERON Mitogen Value: >10 IU/mL
QuantiFERON Nil Value: 0.37 IU/mL
QuantiFERON TB1 Ag Value: 0.37 IU/mL
QuantiFERON TB2 Ag Value: 0.17 IU/mL

## 2021-10-09 LAB — QUANTIFERON-TB GOLD PLUS: QuantiFERON-TB Gold Plus: NEGATIVE

## 2021-10-11 LAB — ZINC: Zinc: 42 ug/dL — ABNORMAL LOW (ref 44–115)

## 2021-10-12 LAB — VITAMIN B1: Vitamin B1 (Thiamine): 132.6 nmol/L (ref 66.5–200.0)

## 2021-10-15 ENCOUNTER — Ambulatory Visit (INDEPENDENT_AMBULATORY_CARE_PROVIDER_SITE_OTHER): Payer: Medicaid Other | Admitting: Primary Care

## 2021-10-15 ENCOUNTER — Encounter (INDEPENDENT_AMBULATORY_CARE_PROVIDER_SITE_OTHER): Payer: Self-pay | Admitting: Primary Care

## 2021-10-15 VITALS — BP 115/73 | HR 77 | Temp 98.2°F | Ht 71.0 in | Wt 166.4 lb

## 2021-10-15 DIAGNOSIS — Z09 Encounter for follow-up examination after completed treatment for conditions other than malignant neoplasm: Secondary | ICD-10-CM | POA: Diagnosis not present

## 2021-10-15 MED ORDER — PREDNISONE 10 MG PO TABS: ORAL_TABLET | ORAL | 0 refills | Status: DC

## 2021-10-15 NOTE — Progress Notes (Unsigned)
New Patient Office Visit  Subjective    Patient ID: Shane Klein, male    DOB: 1991/03/01  Age: 31 y.o. MRN: [id number]  CC:  Chief Complaint  Patient presents with   Hospitalization Follow-up    Small bowel obstruction    HPI Shane Klein presents to establish care and hospital follow up. Camellia is present at his appt per patient request. Ill appearing with abdominal pain - 6/10.Admitted on      for small bowel obstruction.  Patient is concern that he received only 30 prednisone with direction that he is following and is out. Patient is 64 pills short to take him through August. He has a new patient appt with Dr. Carlean Purl on 11/12/21 to re-evaluate and manage chron's disease.Patient has No headache, No chest pain, - No Nausea, No new weakness tingling or numbness, No Cough - shortness of breath    Outpatient Encounter Medications as of 10/15/2021  Medication Sig   acetaminophen (TYLENOL) 500 MG tablet Take 1,000 mg by mouth every 6 (six) hours as needed for moderate pain or headache.   feeding supplement (ENSURE ENLIVE / ENSURE PLUS) LIQD Take 237 mLs by mouth 2 (two) times daily between meals.   Multiple Vitamin (MULTIVITAMIN WITH MINERALS) TABS tablet Take 1 tablet by mouth daily.   pantoprazole (PROTONIX) 40 MG tablet Take 1 tablet (40 mg total) by mouth daily.   thiamine 100 MG tablet Take 1 tablet (100 mg total) by mouth daily.   vitamin B-12 1000 MCG tablet Take 1 tablet (1,000 mcg total) by mouth daily.   predniSONE (DELTASONE) 10 MG tablet Take 4 tablets (40 mg total) by mouth daily with breakfast for 7 days, THEN 3 tablets (30 mg total) daily with breakfast for 7 days, THEN 2 tablets (20 mg total) daily with breakfast for 7 days, THEN 1 tablet (10 mg total) daily with breakfast. (Patient not taking: Reported on 10/15/2021)   No facility-administered encounter medications on file as of 10/15/2021.    Past Medical History:  Diagnosis Date   ANEMIA-IRON DEFICIENCY  04/02/2009   Crohn's disease ([place]) 2010   by colon:cecum and ascending colon, by EGD: duodenum, by imaging also in ileum. No villous atrophy on duodenal biopsies.    GERD (gastroesophageal reflux disease)    GI bleed 04/22/2013   EGD by Dr Teena Irani unremarkable   Iron deficiency anemia    Protein-calorie malnutrition, severe ([place]) 04/15/2013   SBO (small bowel obstruction) ([place]) 2013   in [place] in area of active Crohn's.    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 Fuller Plan: evidence of Crohn's at cecum, ascending colon, unable to intubate the terminal ileum. CTE with distal and terminal ileitis   ESOPHAGOGASTRODUODENOSCOPY  Oct 2010   Dr. Fuller Plan: duodenitis, normal villi, likely Crohn's. Elevated Gliadin but normal TTG, IgA and IgA   ESOPHAGOGASTRODUODENOSCOPY N/A 04/24/2013   Procedure: ESOPHAGOGASTRODUODENOSCOPY (EGD);  Surgeon: Missy Sabins, MD;  Location: [facility] ENDOSCOPY;  Service: Endoscopy;  Laterality: N/A;    Family History  Problem Relation Age of Onset   Diabetes Maternal Aunt    Diabetes Maternal Grandmother    Crohn's disease Maternal Aunt    Hypertension Other    Colon cancer Neg Hx     Social History   Socioeconomic History   Marital status: Single    Spouse name: Not on file   Number of children: Not  on file   Years of education: Not on file   Highest education level: Not on file  Occupational History   Occupation: unemployed  Tobacco Use   Smoking status: Former    Packs/day: 0.25    Years: 15.00    Total pack years: 3.75    Types: Cigarettes    Quit date: 04/26/2017    Years since quitting: 4.4   Smokeless tobacco: Never  Substance and Sexual Activity   Alcohol use: No    Comment: occasionally   Drug use: Yes    Types: Marijuana    Comment: occassionally   Sexual activity: Yes  Other Topics Concern   Not on file  Social History Narrative   Lives in [place] now   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  Constitutional:      Appearance: Normal appearance.  HENT:     Head: Normocephalic.     Right Ear: Tympanic membrane and external ear normal.     Left Ear: Tympanic membrane and external ear normal.     Nose: Nose normal.  Eyes:     Extraocular Movements: Extraocular movements intact.     Pupils: Pupils are equal, round, and reactive to light.  Cardiovascular:     Rate and Rhythm: Normal rate and regular rhythm.  Pulmonary:     Effort: Pulmonary effort is normal.     Breath sounds: Normal breath sounds.  Abdominal:     General: Bowel sounds are normal.     Palpations: Abdomen is soft.  Musculoskeletal:        General: Normal range of motion.     Cervical back: Normal range of motion.  Skin:    General: Skin is warm and dry.  Neurological:     Mental Status: He is alert and oriented to person, place, and time.    Assessment & Plan:   Kerin Perna, NP

## 2021-10-16 ENCOUNTER — Telehealth (INDEPENDENT_AMBULATORY_CARE_PROVIDER_SITE_OTHER): Payer: Self-pay | Admitting: Primary Care

## 2021-10-16 NOTE — Telephone Encounter (Signed)
Does not need prior auth. Pharmacy has rx ready.   Called patient unable to leave voicemail. VM is full. Hopefully he will get notification from pharmacy.

## 2021-11-12 ENCOUNTER — Ambulatory Visit: Payer: Medicaid Other | Admitting: Internal Medicine

## 2021-12-03 ENCOUNTER — Encounter: Payer: Medicaid Other | Admitting: Skilled Nursing Facility1

## 2021-12-24 ENCOUNTER — Other Ambulatory Visit (INDEPENDENT_AMBULATORY_CARE_PROVIDER_SITE_OTHER): Payer: Self-pay | Admitting: Primary Care

## 2021-12-24 ENCOUNTER — Telehealth (INDEPENDENT_AMBULATORY_CARE_PROVIDER_SITE_OTHER): Payer: Self-pay | Admitting: Primary Care

## 2021-12-24 NOTE — Telephone Encounter (Signed)
Copied from [place] [phone]. Topic: General - Other >> Dec 24, 2021  3:57 PM Everette C wrote: Reason for CRM: Medication Refill - Medication: pantoprazole (PROTONIX) 40 MG tablet [[id number]]    predniSONE (DELTASONE) 10 MG tablet [[id number]]   Has the patient contacted their pharmacy? Yes.   (Agent: If no, request that the patient contact the pharmacy for the refill. If patient does not wish to contact the pharmacy document the reason why and proceed with request.) (Agent: If yes, when and what did the pharmacy advise?)  Preferred Pharmacy (with phone number or street name): [facility] [phone] - [place], [place] - [address] [place] [address] Phone: [phone] Fax: [phone] Hours: Not open 24 hours   Has the patient been seen for an appointment in the last year OR does the patient have an upcoming appointment? Yes.    Agent: Please be advised that RX refills may take up to 3 business days. We ask that you follow-up with your pharmacy.

## 2021-12-24 NOTE — Telephone Encounter (Signed)
Reason for CRM: Medication Refill - Medication: pantoprazole (PROTONIX) 40 MG tablet [[id number]]      predniSONE (DELTASONE) 10 MG tablet [[id number]]     Has the patient contacted their pharmacy? Yes.    (Agent: If no, request that the patient contact the pharmacy for the refill. If patient does not wish to contact the pharmacy document the reason why and proceed with request.)  (Agent: If yes, when and what did the pharmacy advise?)    Preferred Pharmacy (with phone number or street name): [facility] [phone] - [place], [place] - [address]  [place] [address]  Phone: [phone] Fax: [phone]  Hours: Not open 24 hours      Has the patient been seen for an appointment in the last year OR does the patient have an upcoming appointment? Yes.      Agent: Please be advised that RX refills may take up to 3 business days. We ask that you follow-up with your pharmacy.

## 2021-12-25 NOTE — Telephone Encounter (Signed)
Requested medication (s) are due for refill today -yes  Requested medication (s) are on the active medication list -yes  Future visit scheduled -yes  Last refill: pantoprazole 10/08/21 #30- outside provider                  Prednisone 10/15/21 #64- non delegated Rx  Notes to clinic: see above - sent for provider review   Requested Prescriptions  Pending Prescriptions Disp Refills   pantoprazole (PROTONIX) 40 MG tablet 30 tablet 0    Sig: Take 1 tablet (40 mg total) by mouth daily.     Gastroenterology: Proton Pump Inhibitors Passed - 12/24/2021  4:21 PM      Passed - Valid encounter within last 12 months    Recent Outpatient Visits           2 months ago Hospital discharge follow-up   [place] Juluis Mire P, NP               predniSONE (DELTASONE) 10 MG tablet 64 tablet 0    Sig: Take 4 tablets (40 mg total) by mouth daily with breakfast for 7 days, THEN 3 tablets (30 mg total) daily with breakfast for 7 days, THEN 2 tablets (20 mg total) daily with breakfast for 7 days, THEN 1 tablet (10 mg total) daily with breakfast     Not Delegated - Endocrinology:  Oral Corticosteroids Failed - 12/24/2021  4:21 PM      Failed - This refill cannot be delegated      Failed - Manual Review: Eye exam for IOP if prolonged treatment      Failed - Bone Mineral Density or Dexa Scan completed in the last 2 years      Passed - Glucose (serum) in normal range and within 180 days    Glucose, Bld  Date Value Ref Range Status  10/07/2021 94 70 - 99 mg/dL Final    Comment:    Glucose reference range applies only to samples taken after fasting for at least 8 hours.   Glucose-Capillary  Date Value Ref Range Status  10/08/2021 75 70 - 99 mg/dL Final    Comment:    Glucose reference range applies only to samples taken after fasting for at least 8 hours.         Passed - K in normal range and within 180 days    Potassium  Date Value Ref Range Status  10/07/2021  3.8 3.5 - 5.1 mmol/L Final         Passed - Na in normal range and within 180 days    Sodium  Date Value Ref Range Status  10/07/2021 138 135 - 145 mmol/L Final         Passed - Last BP in normal range    BP Readings from Last 1 Encounters:  10/15/21 115/73         Passed - Valid encounter within last 6 months    Recent Outpatient Visits           2 months ago Hospital discharge follow-up   [place] Kerin Perna, NP                 Requested Prescriptions  Pending Prescriptions Disp Refills   pantoprazole (PROTONIX) 40 MG tablet 30 tablet 0    Sig: Take 1 tablet (40 mg total) by mouth daily.     Gastroenterology: Proton Pump Inhibitors Passed - 12/24/2021  4:21  PM      Passed - Valid encounter within last 12 months    Recent Outpatient Visits           2 months ago Hospital discharge follow-up   [place] Juluis Mire P, NP               predniSONE (DELTASONE) 10 MG tablet 64 tablet 0    Sig: Take 4 tablets (40 mg total) by mouth daily with breakfast for 7 days, THEN 3 tablets (30 mg total) daily with breakfast for 7 days, THEN 2 tablets (20 mg total) daily with breakfast for 7 days, THEN 1 tablet (10 mg total) daily with breakfast     Not Delegated - Endocrinology:  Oral Corticosteroids Failed - 12/24/2021  4:21 PM      Failed - This refill cannot be delegated      Failed - Manual Review: Eye exam for IOP if prolonged treatment      Failed - Bone Mineral Density or Dexa Scan completed in the last 2 years      Passed - Glucose (serum) in normal range and within 180 days    Glucose, Bld  Date Value Ref Range Status  10/07/2021 94 70 - 99 mg/dL Final    Comment:    Glucose reference range applies only to samples taken after fasting for at least 8 hours.   Glucose-Capillary  Date Value Ref Range Status  10/08/2021 75 70 - 99 mg/dL Final    Comment:    Glucose reference range applies only  to samples taken after fasting for at least 8 hours.         Passed - K in normal range and within 180 days    Potassium  Date Value Ref Range Status  10/07/2021 3.8 3.5 - 5.1 mmol/L Final         Passed - Na in normal range and within 180 days    Sodium  Date Value Ref Range Status  10/07/2021 138 135 - 145 mmol/L Final         Passed - Last BP in normal range    BP Readings from Last 1 Encounters:  10/15/21 115/73         Passed - Valid encounter within last 6 months    Recent Outpatient Visits           2 months ago Hospital discharge follow-up   [place], [place], NP

## 2021-12-25 NOTE — Telephone Encounter (Signed)
Routed to PCP.  PT  was a NO SHOW to scheduled gastro appt

## 2022-06-29 ENCOUNTER — Ambulatory Visit (INDEPENDENT_AMBULATORY_CARE_PROVIDER_SITE_OTHER): Payer: Self-pay

## 2022-06-29 NOTE — Telephone Encounter (Addendum)
Message from Alm Bustard sent at 06/29/2022 12:14 PM EST  Summary: back pain   Pt states that's he is having severe back pain and it is spots on his back that hurt to touch. First available appt is 07/19/22. Pt states he cannot wait that long. Would like to see what could be done for him. Please call pt back.        Unable to make contact with pt after 3 attempts. Routing back to office. Sent pt MyChart message to call back.  Reason for Disposition  Third attempt to contact caller AND no contact made. Phone number verified.  Protocols used: No Contact or Duplicate Contact Call-A-AH

## 2022-06-29 NOTE — Telephone Encounter (Signed)
Message from Alm Bustard sent at 06/29/2022 12:14 PM EST  Summary: back pain   Pt states that's he is having severe back pain and it is spots on his back that hurt to touch. First available appt is 07/19/22. Pt states he cannot wait that long. Would like to see what could be done for him. Please call pt back.        Called pt but unable to LM, VM not set up.

## 2022-06-29 NOTE — Telephone Encounter (Signed)
FYI

## 2022-06-29 NOTE — Telephone Encounter (Signed)
Message from Alm Bustard sent at 06/29/2022 12:14 PM EST  Summary: back pain   Pt states that's he is having severe back pain and it is spots on his back that hurt to touch. First available appt is 07/19/22. Pt states he cannot wait that long. Would like to see what could be done for him. Please call pt back.        Called pt again but unable to LM, VM not set up.

## 2022-06-30 ENCOUNTER — Encounter (INDEPENDENT_AMBULATORY_CARE_PROVIDER_SITE_OTHER): Payer: Self-pay | Admitting: Primary Care

## 2022-06-30 ENCOUNTER — Ambulatory Visit (INDEPENDENT_AMBULATORY_CARE_PROVIDER_SITE_OTHER): Payer: Medicaid Other | Admitting: Primary Care

## 2022-06-30 VITALS — BP 101/65 | HR 82 | Resp 16 | Wt 136.6 lb

## 2022-06-30 DIAGNOSIS — G8929 Other chronic pain: Secondary | ICD-10-CM | POA: Diagnosis not present

## 2022-06-30 DIAGNOSIS — M542 Cervicalgia: Secondary | ICD-10-CM

## 2022-06-30 DIAGNOSIS — M549 Dorsalgia, unspecified: Secondary | ICD-10-CM

## 2022-06-30 NOTE — Progress Notes (Addendum)
Shane Klein, is a 32 y.o. male  [id number]  [id number]  DOB - 18-Dec-1990  Chief Complaint  Patient presents with   Back Pain       Subjective:   Shane Klein is a 32 y.o. male here today for an acute visit. Patient has No headache, No chest pain, No abdominal pain - No Nausea, No new weakness tingling or numbness, No Cough - shortness of breath. He has c/o increasing back pain. Stated in high school he dropped weight on his self thought nothing about it. In the last couple of years his back starting knotting up and hurting worst . Aggravating factors: certain movements and prolonged walking/standing. Alleviating factors: rest. Progressive LE weakness or saddle anesthesia: none. Extremity sensation changes or weakness: none. Ambulatory without difficulty. Normal bowel/bladder habits: yes; without urinary retention. Poor PO intake with n/v.   Self treatment: has OTC analgesics, with minimal relief   No problems updated.  Allergies  Allergen Reactions   Gluten Meal Other (See Comments)    Stomach pain   Wheat Other (See Comments)    Belly pain    Past Medical History:  Diagnosis Date   ANEMIA-IRON DEFICIENCY 04/02/2009   Crohn's disease ([place]) 2010   by colon:cecum and ascending colon, by EGD: duodenum, by imaging also in ileum. No villous atrophy on duodenal biopsies.    GERD (gastroesophageal reflux disease)    GI bleed 04/22/2013   EGD by Dr Teena Irani unremarkable   Iron deficiency anemia    Protein-calorie malnutrition, severe ([place]) 04/15/2013   SBO (small bowel obstruction) ([place]) 2013   in [place] in area of active Crohn's.    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 (Patient not taking: Reported on 06/30/2022)     Multiple Vitamin (MULTIVITAMIN  WITH MINERALS) TABS tablet Take 1 tablet by mouth daily. (Patient not taking: Reported on 06/30/2022) 100 tablet 2   pantoprazole (PROTONIX) 40 MG tablet Take 1 tablet (40 mg total) by mouth daily. 30 tablet 0   predniSONE (DELTASONE) 10 MG tablet Take 4 tablets (40 mg total) by mouth daily with breakfast for 7 days, THEN 3 tablets (30 mg total) daily with breakfast for 7 days, THEN 2 tablets (20 mg total) daily with breakfast for 7 days, THEN 1 tablet (10 mg total) daily with breakfast (Patient not taking: Reported on 06/30/2022) 64 tablet 0   thiamine 100 MG tablet Take 1 tablet (100 mg total) by mouth daily. (Patient not taking: Reported on 06/30/2022) 200 tablet 2   vitamin B-12 1000 MCG tablet Take 1 tablet (1,000 mcg total) by mouth daily. (Patient not taking: Reported on 06/30/2022) 100 tablet 2   No current facility-administered medications on file prior to visit.    Objective:   Vitals:   06/30/22 1112  BP: 101/65  Pulse: 82  Resp: 16  SpO2: 99%  Weight: 136 lb 9.6 oz (62 kg)    Comprehensive ROS Pertinent positive and negative noted in HPI   Exam General appearance : Awake, alert, not in any distress. Speech Clear. Not toxic looking HEENT: Atraumatic and Normocephalic, pupils equally reactive to light and accomodation Neck: Supple, no JVD. No cervical lymphadenopathy.  Chest: Good air entry bilaterally, no added sounds  CVS: S1 S2 regular, no murmurs.  Abdomen: Bowel sounds present, Non tender and not distended with no gaurding, rigidity or rebound. See photo Extremities: B/L Lower Ext shows no edema, both legs are warm to touch Neurology: Awake alert, and oriented X 3, Non focal Skin: No Rash  Data Review No results found for: "HGBA1C"  Assessment & Plan  Shane Klein was seen today for back pain.  Diagnoses and all orders for this visit:  Other chronic back pain 2/2  Cervical pain (neck)      Patient have been counseled extensively about nutrition and exercise. Other  issues discussed during this visit include: low cholesterol diet, weight control and daily exercise, foot care, annual eye examinations at Ophthalmology, importance of adherence with medications and regular follow-up. We also discussed long term complications of uncontrolled diabetes and hypertension.   No follow-ups on file.  The patient was given clear instructions to go to ER or return to medical center if symptoms don't improve, worsen or new problems develop. The patient verbalized understanding. The patient was told to call to get lab results if they haven't heard anything in the next week.   This note has been created with Surveyor, quantity. Any transcriptional errors are unintentional.   Kerin Perna, NP 07/05/2022, 1:57 PM

## 2022-07-22 ENCOUNTER — Emergency Department (HOSPITAL_COMMUNITY): Payer: Medicaid Other | Admitting: Registered Nurse

## 2022-07-22 ENCOUNTER — Inpatient Hospital Stay (HOSPITAL_COMMUNITY): Payer: Medicaid Other

## 2022-07-22 ENCOUNTER — Inpatient Hospital Stay (HOSPITAL_COMMUNITY)
Admission: EM | Admit: 2022-07-22 | Discharge: 2022-09-03 | DRG: 957 | Disposition: A | Discharge status: Rehabilitation Facility | Payer: Medicaid Other | Attending: Surgery | Admitting: Surgery

## 2022-07-22 ENCOUNTER — Other Ambulatory Visit: Payer: Self-pay

## 2022-07-22 ENCOUNTER — Encounter (HOSPITAL_COMMUNITY): Payer: Self-pay | Admitting: Certified Registered Nurse Anesthetist

## 2022-07-22 ENCOUNTER — Emergency Department (HOSPITAL_COMMUNITY): Payer: Medicaid Other

## 2022-07-22 ENCOUNTER — Encounter (HOSPITAL_COMMUNITY): Admission: EM | Disposition: A | Discharge status: Rehabilitation Facility | Payer: Self-pay | Source: Home / Self Care

## 2022-07-22 ENCOUNTER — Encounter (HOSPITAL_COMMUNITY): Payer: Self-pay | Admitting: Surgery

## 2022-07-22 DIAGNOSIS — K56609 Unspecified intestinal obstruction, unspecified as to partial versus complete obstruction: Secondary | ICD-10-CM

## 2022-07-22 DIAGNOSIS — W3400XA Accidental discharge from unspecified firearms or gun, initial encounter: Secondary | ICD-10-CM

## 2022-07-22 DIAGNOSIS — K922 Gastrointestinal hemorrhage, unspecified: Secondary | ICD-10-CM

## 2022-07-22 DIAGNOSIS — K50014 Crohn's disease of small intestine with abscess: Secondary | ICD-10-CM | POA: Diagnosis not present

## 2022-07-22 DIAGNOSIS — S36408A Unspecified injury of other part of small intestine, initial encounter: Secondary | ICD-10-CM | POA: Diagnosis not present

## 2022-07-22 DIAGNOSIS — T07XXXA Unspecified multiple injuries, initial encounter: Principal | ICD-10-CM

## 2022-07-22 DIAGNOSIS — D649 Anemia, unspecified: Secondary | ICD-10-CM

## 2022-07-22 DIAGNOSIS — J942 Hemothorax: Secondary | ICD-10-CM

## 2022-07-22 DIAGNOSIS — S36502A Unspecified injury of descending [left] colon, initial encounter: Secondary | ICD-10-CM

## 2022-07-22 DIAGNOSIS — S31149A Puncture wound of abdominal wall with foreign body, unspecified quadrant without penetration into peritoneal cavity, initial encounter: Secondary | ICD-10-CM

## 2022-07-22 DIAGNOSIS — G5631 Lesion of radial nerve, right upper limb: Secondary | ICD-10-CM | POA: Diagnosis present

## 2022-07-22 DIAGNOSIS — E440 Moderate protein-calorie malnutrition: Secondary | ICD-10-CM | POA: Insufficient documentation

## 2022-07-22 DIAGNOSIS — S42411B Displaced simple supracondylar fracture without intercondylar fracture of right humerus, initial encounter for open fracture: Secondary | ICD-10-CM | POA: Diagnosis present

## 2022-07-22 DIAGNOSIS — K219 Gastro-esophageal reflux disease without esophagitis: Secondary | ICD-10-CM | POA: Diagnosis present

## 2022-07-22 DIAGNOSIS — S42301B Unspecified fracture of shaft of humerus, right arm, initial encounter for open fracture: Secondary | ICD-10-CM | POA: Diagnosis present

## 2022-07-22 DIAGNOSIS — G4700 Insomnia, unspecified: Secondary | ICD-10-CM | POA: Diagnosis not present

## 2022-07-22 DIAGNOSIS — T8130XA Disruption of wound, unspecified, initial encounter: Secondary | ICD-10-CM | POA: Diagnosis not present

## 2022-07-22 DIAGNOSIS — F419 Anxiety disorder, unspecified: Secondary | ICD-10-CM | POA: Diagnosis present

## 2022-07-22 DIAGNOSIS — F4310 Post-traumatic stress disorder, unspecified: Secondary | ICD-10-CM | POA: Diagnosis not present

## 2022-07-22 DIAGNOSIS — M898X9 Other specified disorders of bone, unspecified site: Secondary | ICD-10-CM | POA: Diagnosis present

## 2022-07-22 DIAGNOSIS — D75839 Thrombocytosis, unspecified: Secondary | ICD-10-CM | POA: Diagnosis not present

## 2022-07-22 DIAGNOSIS — R1900 Intra-abdominal and pelvic swelling, mass and lump, unspecified site: Secondary | ICD-10-CM | POA: Diagnosis not present

## 2022-07-22 DIAGNOSIS — S24154D Other incomplete lesion at T11-T12 level of thoracic spinal cord, subsequent encounter: Secondary | ICD-10-CM | POA: Diagnosis not present

## 2022-07-22 DIAGNOSIS — I468 Cardiac arrest due to other underlying condition: Secondary | ICD-10-CM | POA: Diagnosis not present

## 2022-07-22 DIAGNOSIS — S5421XA Injury of radial nerve at forearm level, right arm, initial encounter: Secondary | ICD-10-CM | POA: Diagnosis present

## 2022-07-22 DIAGNOSIS — S31139A Puncture wound of abdominal wall without foreign body, unspecified quadrant without penetration into peritoneal cavity, initial encounter: Secondary | ICD-10-CM | POA: Diagnosis present

## 2022-07-22 DIAGNOSIS — B952 Enterococcus as the cause of diseases classified elsewhere: Secondary | ICD-10-CM | POA: Diagnosis not present

## 2022-07-22 DIAGNOSIS — S272XXA Traumatic hemopneumothorax, initial encounter: Secondary | ICD-10-CM | POA: Diagnosis present

## 2022-07-22 DIAGNOSIS — E875 Hyperkalemia: Secondary | ICD-10-CM | POA: Diagnosis present

## 2022-07-22 DIAGNOSIS — S34109D Unspecified injury to unspecified level of lumbar spinal cord, subsequent encounter: Secondary | ICD-10-CM | POA: Diagnosis not present

## 2022-07-22 DIAGNOSIS — R579 Shock, unspecified: Secondary | ICD-10-CM | POA: Diagnosis not present

## 2022-07-22 DIAGNOSIS — W3400XD Accidental discharge from unspecified firearms or gun, subsequent encounter: Secondary | ICD-10-CM | POA: Diagnosis not present

## 2022-07-22 DIAGNOSIS — J9601 Acute respiratory failure with hypoxia: Secondary | ICD-10-CM | POA: Diagnosis present

## 2022-07-22 DIAGNOSIS — S34104S Unspecified injury to L4 level of lumbar spinal cord, sequela: Secondary | ICD-10-CM | POA: Diagnosis not present

## 2022-07-22 DIAGNOSIS — E559 Vitamin D deficiency, unspecified: Secondary | ICD-10-CM | POA: Diagnosis present

## 2022-07-22 DIAGNOSIS — K567 Ileus, unspecified: Secondary | ICD-10-CM | POA: Diagnosis not present

## 2022-07-22 DIAGNOSIS — Z5329 Procedure and treatment not carried out because of patient's decision for other reasons: Secondary | ICD-10-CM | POA: Diagnosis present

## 2022-07-22 DIAGNOSIS — S36409A Unspecified injury of unspecified part of small intestine, initial encounter: Secondary | ICD-10-CM | POA: Diagnosis present

## 2022-07-22 DIAGNOSIS — S72102A Unspecified trochanteric fracture of left femur, initial encounter for closed fracture: Secondary | ICD-10-CM | POA: Diagnosis not present

## 2022-07-22 DIAGNOSIS — N179 Acute kidney failure, unspecified: Secondary | ICD-10-CM | POA: Diagnosis present

## 2022-07-22 DIAGNOSIS — K661 Hemoperitoneum: Secondary | ICD-10-CM | POA: Diagnosis present

## 2022-07-22 DIAGNOSIS — D62 Acute posthemorrhagic anemia: Secondary | ICD-10-CM | POA: Diagnosis not present

## 2022-07-22 DIAGNOSIS — S91032A Puncture wound without foreign body, left ankle, initial encounter: Secondary | ICD-10-CM | POA: Diagnosis present

## 2022-07-22 DIAGNOSIS — I82210 Acute embolism and thrombosis of superior vena cava: Secondary | ICD-10-CM | POA: Diagnosis not present

## 2022-07-22 DIAGNOSIS — E739 Lactose intolerance, unspecified: Secondary | ICD-10-CM | POA: Diagnosis present

## 2022-07-22 DIAGNOSIS — R188 Other ascites: Secondary | ICD-10-CM | POA: Diagnosis not present

## 2022-07-22 DIAGNOSIS — E871 Hypo-osmolality and hyponatremia: Secondary | ICD-10-CM | POA: Diagnosis not present

## 2022-07-22 DIAGNOSIS — S72121A Displaced fracture of lesser trochanter of right femur, initial encounter for closed fracture: Secondary | ICD-10-CM | POA: Diagnosis present

## 2022-07-22 DIAGNOSIS — S37092A Other injury of left kidney, initial encounter: Secondary | ICD-10-CM | POA: Diagnosis present

## 2022-07-22 DIAGNOSIS — F439 Reaction to severe stress, unspecified: Secondary | ICD-10-CM | POA: Diagnosis not present

## 2022-07-22 DIAGNOSIS — F430 Acute stress reaction: Secondary | ICD-10-CM | POA: Diagnosis not present

## 2022-07-22 DIAGNOSIS — B9620 Unspecified Escherichia coli [E. coli] as the cause of diseases classified elsewhere: Secondary | ICD-10-CM | POA: Diagnosis not present

## 2022-07-22 DIAGNOSIS — E861 Hypovolemia: Secondary | ICD-10-CM | POA: Diagnosis not present

## 2022-07-22 DIAGNOSIS — K3189 Other diseases of stomach and duodenum: Secondary | ICD-10-CM | POA: Diagnosis not present

## 2022-07-22 DIAGNOSIS — S42411S Displaced simple supracondylar fracture without intercondylar fracture of right humerus, sequela: Secondary | ICD-10-CM | POA: Diagnosis not present

## 2022-07-22 DIAGNOSIS — S81831A Puncture wound without foreign body, right lower leg, initial encounter: Secondary | ICD-10-CM | POA: Diagnosis present

## 2022-07-22 DIAGNOSIS — T8142XD Infection following a procedure, deep incisional surgical site, subsequent encounter: Secondary | ICD-10-CM | POA: Diagnosis not present

## 2022-07-22 DIAGNOSIS — G928 Other toxic encephalopathy: Secondary | ICD-10-CM | POA: Diagnosis not present

## 2022-07-22 DIAGNOSIS — S34104A Unspecified injury to L4 level of lumbar spinal cord, initial encounter: Secondary | ICD-10-CM | POA: Diagnosis present

## 2022-07-22 DIAGNOSIS — K6812 Psoas muscle abscess: Secondary | ICD-10-CM | POA: Diagnosis not present

## 2022-07-22 DIAGNOSIS — F4320 Adjustment disorder, unspecified: Secondary | ICD-10-CM | POA: Diagnosis not present

## 2022-07-22 DIAGNOSIS — K50011 Crohn's disease of small intestine with rectal bleeding: Secondary | ICD-10-CM | POA: Diagnosis not present

## 2022-07-22 DIAGNOSIS — S81832A Puncture wound without foreign body, left lower leg, initial encounter: Secondary | ICD-10-CM | POA: Diagnosis present

## 2022-07-22 DIAGNOSIS — Z79899 Other long term (current) drug therapy: Secondary | ICD-10-CM

## 2022-07-22 DIAGNOSIS — S91031A Puncture wound without foreign body, right ankle, initial encounter: Secondary | ICD-10-CM | POA: Diagnosis present

## 2022-07-22 DIAGNOSIS — L089 Local infection of the skin and subcutaneous tissue, unspecified: Secondary | ICD-10-CM | POA: Diagnosis not present

## 2022-07-22 DIAGNOSIS — S42401A Unspecified fracture of lower end of right humerus, initial encounter for closed fracture: Secondary | ICD-10-CM | POA: Diagnosis not present

## 2022-07-22 DIAGNOSIS — S72112A Displaced fracture of greater trochanter of left femur, initial encounter for closed fracture: Secondary | ICD-10-CM | POA: Diagnosis present

## 2022-07-22 DIAGNOSIS — R7989 Other specified abnormal findings of blood chemistry: Secondary | ICD-10-CM | POA: Diagnosis not present

## 2022-07-22 DIAGNOSIS — I472 Ventricular tachycardia, unspecified: Secondary | ICD-10-CM | POA: Diagnosis not present

## 2022-07-22 DIAGNOSIS — Z6822 Body mass index (BMI) 22.0-22.9, adult: Secondary | ICD-10-CM

## 2022-07-22 DIAGNOSIS — F4329 Adjustment disorder with other symptoms: Secondary | ICD-10-CM | POA: Diagnosis not present

## 2022-07-22 DIAGNOSIS — J9382 Other air leak: Secondary | ICD-10-CM | POA: Diagnosis not present

## 2022-07-22 DIAGNOSIS — E876 Hypokalemia: Secondary | ICD-10-CM | POA: Diagnosis present

## 2022-07-22 DIAGNOSIS — T148XXA Other injury of unspecified body region, initial encounter: Secondary | ICD-10-CM | POA: Diagnosis not present

## 2022-07-22 DIAGNOSIS — S24103A Unspecified injury at T7-T10 level of thoracic spinal cord, initial encounter: Secondary | ICD-10-CM | POA: Diagnosis not present

## 2022-07-22 DIAGNOSIS — R109 Unspecified abdominal pain: Secondary | ICD-10-CM | POA: Diagnosis not present

## 2022-07-22 DIAGNOSIS — S72101A Unspecified trochanteric fracture of right femur, initial encounter for closed fracture: Secondary | ICD-10-CM | POA: Diagnosis not present

## 2022-07-22 DIAGNOSIS — G709 Myoneural disorder, unspecified: Secondary | ICD-10-CM | POA: Diagnosis not present

## 2022-07-22 DIAGNOSIS — Y249XXA Unspecified firearm discharge, undetermined intent, initial encounter: Secondary | ICD-10-CM

## 2022-07-22 HISTORY — PX: LAPAROTOMY: SHX154

## 2022-07-22 HISTORY — PX: BOWEL RESECTION: SHX1257

## 2022-07-22 HISTORY — PX: ILEO LOOP DIVERSION: SHX1780

## 2022-07-22 LAB — I-STAT CHEM 8, ED
BUN: 22 mg/dL — ABNORMAL HIGH (ref 6–20)
Calcium, Ion: 1.15 mmol/L (ref 1.15–1.40)
Chloride: 106 mmol/L (ref 98–111)
Creatinine, Ser: 1.8 mg/dL — ABNORMAL HIGH (ref 0.61–1.24)
Glucose, Bld: 177 mg/dL — ABNORMAL HIGH (ref 70–99)
HCT: 25 % — ABNORMAL LOW (ref 39.0–52.0)
Hemoglobin: 8.5 g/dL — ABNORMAL LOW (ref 13.0–17.0)
Potassium: 3.6 mmol/L (ref 3.5–5.1)
Sodium: 140 mmol/L (ref 135–145)
TCO2: 19 mmol/L — ABNORMAL LOW (ref 22–32)

## 2022-07-22 LAB — POCT I-STAT 7, (LYTES, BLD GAS, ICA,H+H)
Acid-base deficit: 7 mmol/L — ABNORMAL HIGH (ref 0.0–2.0)
Acid-base deficit: 6 mmol/L — ABNORMAL HIGH (ref 0.0–2.0)
Acid-base deficit: 6 mmol/L — ABNORMAL HIGH (ref 0.0–2.0)
Bicarbonate: 18.9 mmol/L — ABNORMAL LOW (ref 20.0–28.0)
Bicarbonate: 20.5 mmol/L (ref 20.0–28.0)
Bicarbonate: 18.9 mmol/L — ABNORMAL LOW (ref 20.0–28.0)
Calcium, Ion: 1.12 mmol/L — ABNORMAL LOW (ref 1.15–1.40)
Calcium, Ion: 1.13 mmol/L — ABNORMAL LOW (ref 1.15–1.40)
Calcium, Ion: 1.1 mmol/L — ABNORMAL LOW (ref 1.15–1.40)
HCT: 22 % — ABNORMAL LOW (ref 39.0–52.0)
HCT: 27 % — ABNORMAL LOW (ref 39.0–52.0)
HCT: 23 % — ABNORMAL LOW (ref 39.0–52.0)
Hemoglobin: 7.5 g/dL — ABNORMAL LOW (ref 13.0–17.0)
Hemoglobin: 9.2 g/dL — ABNORMAL LOW (ref 13.0–17.0)
Hemoglobin: 7.8 g/dL — ABNORMAL LOW (ref 13.0–17.0)
O2 Saturation: 100 %
O2 Saturation: 100 %
O2 Saturation: 100 %
Potassium: 4.8 mmol/L (ref 3.5–5.1)
Potassium: 4.8 mmol/L (ref 3.5–5.1)
Potassium: 4.5 mmol/L (ref 3.5–5.1)
Sodium: 137 mmol/L (ref 135–145)
Sodium: 139 mmol/L (ref 135–145)
Sodium: 140 mmol/L (ref 135–145)
TCO2: 20 mmol/L — ABNORMAL LOW (ref 22–32)
TCO2: 22 mmol/L (ref 22–32)
TCO2: 20 mmol/L — ABNORMAL LOW (ref 22–32)
pCO2 arterial: 38.4 mmHg (ref 32–48)
pCO2 arterial: 42.9 mmHg (ref 32–48)
pCO2 arterial: 35.1 mmHg (ref 32–48)
pH, Arterial: 7.3 — ABNORMAL LOW (ref 7.35–7.45)
pH, Arterial: 7.287 — ABNORMAL LOW (ref 7.35–7.45)
pH, Arterial: 7.338 — ABNORMAL LOW (ref 7.35–7.45)
pO2, Arterial: 413 mmHg — ABNORMAL HIGH (ref 83–108)
pO2, Arterial: 425 mmHg — ABNORMAL HIGH (ref 83–108)
pO2, Arterial: 456 mmHg — ABNORMAL HIGH (ref 83–108)

## 2022-07-22 LAB — PREPARE RBC (CROSSMATCH)

## 2022-07-22 LAB — CBC
HCT: 23.9 % — ABNORMAL LOW (ref 39.0–52.0)
HCT: 24.7 % — ABNORMAL LOW (ref 39.0–52.0)
HCT: 31.3 % — ABNORMAL LOW (ref 39.0–52.0)
Hemoglobin: 7.2 g/dL — ABNORMAL LOW (ref 13.0–17.0)
Hemoglobin: 8.6 g/dL — ABNORMAL LOW (ref 13.0–17.0)
Hemoglobin: 10.5 g/dL — ABNORMAL LOW (ref 13.0–17.0)
MCH: 24.1 pg — ABNORMAL LOW (ref 26.0–34.0)
MCH: 27.8 pg (ref 26.0–34.0)
MCH: 26.4 pg (ref 26.0–34.0)
MCHC: 30.1 g/dL (ref 30.0–36.0)
MCHC: 34.8 g/dL (ref 30.0–36.0)
MCHC: 33.5 g/dL (ref 30.0–36.0)
MCV: 79.9 fL — ABNORMAL LOW (ref 80.0–100.0)
MCV: 79.9 fL — ABNORMAL LOW (ref 80.0–100.0)
MCV: 78.6 fL — ABNORMAL LOW (ref 80.0–100.0)
Platelets: 649 10*3/uL — ABNORMAL HIGH (ref 150–400)
Platelets: 264 10*3/uL (ref 150–400)
Platelets: 260 10*3/uL (ref 150–400)
RBC: 2.99 MIL/uL — ABNORMAL LOW (ref 4.22–5.81)
RBC: 3.09 MIL/uL — ABNORMAL LOW (ref 4.22–5.81)
RBC: 3.98 MIL/uL — ABNORMAL LOW (ref 4.22–5.81)
RDW: 27.7 % — ABNORMAL HIGH (ref 11.5–15.5)
RDW: 19.3 % — ABNORMAL HIGH (ref 11.5–15.5)
RDW: 19.9 % — ABNORMAL HIGH (ref 11.5–15.5)
WBC: 13.2 10*3/uL — ABNORMAL HIGH (ref 4.0–10.5)
WBC: 5.8 10*3/uL (ref 4.0–10.5)
WBC: 10.7 10*3/uL — ABNORMAL HIGH (ref 4.0–10.5)
nRBC: 0.6 % — ABNORMAL HIGH (ref 0.0–0.2)
nRBC: 4.2 % — ABNORMAL HIGH (ref 0.0–0.2)
nRBC: 4 % — ABNORMAL HIGH (ref 0.0–0.2)

## 2022-07-22 LAB — BASIC METABOLIC PANEL
Anion gap: 8 (ref 5–15)
BUN: 22 mg/dL — ABNORMAL HIGH (ref 6–20)
CO2: 20 mmol/L — ABNORMAL LOW (ref 22–32)
Calcium: 6.8 mg/dL — ABNORMAL LOW (ref 8.9–10.3)
Chloride: 110 mmol/L (ref 98–111)
Creatinine, Ser: 1.25 mg/dL — ABNORMAL HIGH (ref 0.61–1.24)
GFR, Estimated: >60 mL/min (ref >60)
Glucose, Bld: 191 mg/dL — ABNORMAL HIGH (ref 70–99)
Potassium: 4.3 mmol/L (ref 3.5–5.1)
Sodium: 138 mmol/L (ref 135–145)

## 2022-07-22 LAB — COMPREHENSIVE METABOLIC PANEL
ALT: 19 U/L (ref 0–44)
AST: 31 U/L (ref 15–41)
Albumin: 2.3 g/dL — ABNORMAL LOW (ref 3.5–5.0)
Alkaline Phosphatase: 50 U/L (ref 38–126)
Anion gap: 18 — ABNORMAL HIGH (ref 5–15)
BUN: 22 mg/dL — ABNORMAL HIGH (ref 6–20)
CO2: 15 mmol/L — ABNORMAL LOW (ref 22–32)
Calcium: 8.2 mg/dL — ABNORMAL LOW (ref 8.9–10.3)
Chloride: 105 mmol/L (ref 98–111)
Creatinine, Ser: 1.78 mg/dL — ABNORMAL HIGH (ref 0.61–1.24)
GFR, Estimated: 52 mL/min — ABNORMAL LOW (ref >60)
Glucose, Bld: 185 mg/dL — ABNORMAL HIGH (ref 70–99)
Potassium: 3.6 mmol/L (ref 3.5–5.1)
Sodium: 138 mmol/L (ref 135–145)
Total Bilirubin: 0.6 mg/dL (ref 0.3–1.2)
Total Protein: 5.9 g/dL — ABNORMAL LOW (ref 6.5–8.1)

## 2022-07-22 LAB — HIV ANTIBODY (ROUTINE TESTING W REFLEX): HIV Screen 4th Generation wRfx: NONREACTIVE

## 2022-07-22 LAB — ABO/RH: ABO/RH(D): A POS

## 2022-07-22 LAB — PROTIME-INR
INR: 1.1 (ref 0.8–1.2)
Prothrombin Time: 14.1 seconds (ref 11.4–15.2)

## 2022-07-22 LAB — LACTIC ACID, PLASMA: Lactic Acid, Venous: >9 mmol/L (ref 0.5–1.9)

## 2022-07-22 LAB — ETHANOL: Alcohol, Ethyl (B): <10 mg/dL (ref <10)

## 2022-07-22 SURGERY — LAPAROTOMY, EXPLORATORY
Anesthesia: General | Site: Abdomen

## 2022-07-22 MED ORDER — PROPOFOL 10 MG/ML IV BOLUS
INTRAVENOUS | Status: DC | PRN
  Administered 2022-07-22: 120 mg via INTRAVENOUS

## 2022-07-22 MED ORDER — FENTANYL CITRATE (PF) 250 MCG/5ML IJ SOLN
INTRAMUSCULAR | Status: AC
  Filled 2022-07-22: qty 5

## 2022-07-22 MED ORDER — LACTATED RINGERS IV SOLN: INTRAVENOUS | Status: DC | PRN

## 2022-07-22 MED ORDER — CALCIUM CHLORIDE 10 % IV SOLN
1.0000 g | Freq: Once | INTRAVENOUS | Status: AC
  Administered 2022-07-22: 1 g via INTRAVENOUS

## 2022-07-22 MED ORDER — DEXTROSE 5 % IV SOLN
300.0000 mg | Freq: Once | INTRAVENOUS | Status: AC
  Administered 2022-07-22: 300 mg via INTRAVENOUS

## 2022-07-22 MED ORDER — PHENYLEPHRINE HCL-NACL 20-0.9 MG/250ML-% IV SOLN
INTRAVENOUS | Status: DC | PRN
  Administered 2022-07-22: 30 ug/min via INTRAVENOUS

## 2022-07-22 MED ORDER — SODIUM CHLORIDE 0.9% IV SOLUTION: Freq: Once | INTRAVENOUS | Status: DC

## 2022-07-22 MED ORDER — ORAL CARE MOUTH RINSE
15.0000 mL | OROMUCOSAL | Status: DC
  Administered 2022-07-22 – 2022-07-23 (×8): 15 mL via OROMUCOSAL

## 2022-07-22 MED ORDER — LACTATED RINGERS IV SOLN: INTRAVENOUS | Status: DC

## 2022-07-22 MED ORDER — METHYLPREDNISOLONE SODIUM SUCC 40 MG IJ SOLR
40.0000 mg | Freq: Every day | INTRAMUSCULAR | Status: DC
  Administered 2022-07-22: 40 mg via INTRAVENOUS
  Filled 2022-07-22: qty 1

## 2022-07-22 MED ORDER — ROCURONIUM BROMIDE 10 MG/ML (PF) SYRINGE
PREFILLED_SYRINGE | INTRAVENOUS | Status: DC | PRN
  Administered 2022-07-22 (×2): 50 mg via INTRAVENOUS
  Administered 2022-07-22: 30 mg via INTRAVENOUS

## 2022-07-22 MED ORDER — NOREPINEPHRINE 4 MG/250ML-% IV SOLN
0.0000 ug/min | INTRAVENOUS | Status: DC
  Administered 2022-07-22: 28 ug/min via INTRAVENOUS
  Administered 2022-07-22: 15 ug/min via INTRAVENOUS
  Administered 2022-07-22: 25 ug/min via INTRAVENOUS
  Administered 2022-07-23: 28 ug/min via INTRAVENOUS
  Filled 2022-07-22 (×2): qty 500

## 2022-07-22 MED ORDER — FENTANYL CITRATE PF 50 MCG/ML IJ SOSY: 50.0000 ug | PREFILLED_SYRINGE | INTRAMUSCULAR | Status: DC | PRN

## 2022-07-22 MED ORDER — DOCUSATE SODIUM 50 MG/5ML PO LIQD
100.0000 mg | Freq: Two times a day (BID) | ORAL | Status: DC
  Administered 2022-07-23: 100 mg
  Filled 2022-07-22: qty 10

## 2022-07-22 MED ORDER — ALBUMIN HUMAN 5 % IV SOLN
25.0000 g | Freq: Once | INTRAVENOUS | Status: AC
  Administered 2022-07-22: 25 g via INTRAVENOUS
  Filled 2022-07-22: qty 500

## 2022-07-22 MED ORDER — ORAL CARE MOUTH RINSE: 15.0000 mL | OROMUCOSAL | Status: DC | PRN

## 2022-07-22 MED ORDER — SODIUM BICARBONATE 8.4 % IV SOLN
50.0000 meq | Freq: Once | INTRAVENOUS | Status: AC
  Administered 2022-07-22: 50 meq via INTRAVENOUS

## 2022-07-22 MED ORDER — FENTANYL CITRATE PF 50 MCG/ML IJ SOSY: 50.0000 ug | PREFILLED_SYRINGE | Freq: Once | INTRAMUSCULAR | Status: DC

## 2022-07-22 MED ORDER — LIDOCAINE 2% (20 MG/ML) 5 ML SYRINGE
INTRAMUSCULAR | Status: DC | PRN
  Administered 2022-07-22: 60 mg via INTRAVENOUS

## 2022-07-22 MED ORDER — EPINEPHRINE 1 MG/10ML IJ SOSY
1.0000 mg | PREFILLED_SYRINGE | Freq: Once | INTRAMUSCULAR | Status: AC
  Administered 2022-07-22: 1 mg via INTRAVENOUS

## 2022-07-22 MED ORDER — POLYETHYLENE GLYCOL 3350 17 G PO PACK
17.0000 g | PACK | Freq: Every day | ORAL | Status: DC
  Administered 2022-07-23: 17 g
  Filled 2022-07-22: qty 1

## 2022-07-22 MED ORDER — ORAL CARE MOUTH RINSE
15.0000 mL | OROMUCOSAL | Status: DC
  Administered 2022-07-22 – 2022-07-29 (×75): 15 mL via OROMUCOSAL

## 2022-07-22 MED ORDER — CHLORHEXIDINE GLUCONATE CLOTH 2 % EX PADS
6.0000 | MEDICATED_PAD | Freq: Every day | CUTANEOUS | Status: DC
  Administered 2022-07-23 – 2022-08-30 (×40): 6 via TOPICAL

## 2022-07-22 MED ORDER — CALCIUM GLUCONATE-NACL 1-0.675 GM/50ML-% IV SOLN
1.0000 g | Freq: Once | INTRAVENOUS | Status: AC
  Administered 2022-07-22: 1000 mg via INTRAVENOUS
  Filled 2022-07-22: qty 50

## 2022-07-22 MED ORDER — IOHEXOL 350 MG/ML SOLN
75.0000 mL | Freq: Once | INTRAVENOUS | Status: AC | PRN
  Administered 2022-07-22: 75 mL via INTRAVENOUS

## 2022-07-22 MED ORDER — ALBUMIN HUMAN 5 % IV SOLN: INTRAVENOUS | Status: DC | PRN

## 2022-07-22 MED ORDER — PROPOFOL 10 MG/ML IV BOLUS
INTRAVENOUS | Status: AC
  Filled 2022-07-22: qty 20

## 2022-07-22 MED ORDER — FENTANYL BOLUS VIA INFUSION
50.0000 ug | INTRAVENOUS | Status: DC | PRN
  Administered 2022-07-22 – 2022-07-27 (×10): 100 ug via INTRAVENOUS
  Administered 2022-07-27 (×2): 50 ug via INTRAVENOUS
  Administered 2022-07-27: 100 ug via INTRAVENOUS
  Administered 2022-07-27 (×2): 50 ug via INTRAVENOUS
  Administered 2022-07-27: 100 ug via INTRAVENOUS

## 2022-07-22 MED ORDER — PHENYLEPHRINE HCL-NACL 20-0.9 MG/250ML-% IV SOLN
0.0000 ug/min | INTRAVENOUS | Status: DC
  Administered 2022-07-22: 25 ug/min via INTRAVENOUS

## 2022-07-22 MED ORDER — SUCCINYLCHOLINE CHLORIDE 200 MG/10ML IV SOSY
PREFILLED_SYRINGE | INTRAVENOUS | Status: DC | PRN
  Administered 2022-07-22: 120 mg via INTRAVENOUS

## 2022-07-22 MED ORDER — MIDAZOLAM HCL 2 MG/2ML IJ SOLN: 1.0000 mg | INTRAMUSCULAR | Status: DC | PRN

## 2022-07-22 MED ORDER — ENOXAPARIN SODIUM 30 MG/0.3ML IJ SOSY
30.0000 mg | PREFILLED_SYRINGE | Freq: Two times a day (BID) | INTRAMUSCULAR | Status: DC
  Administered 2022-07-24 – 2022-07-31 (×15): 30 mg via SUBCUTANEOUS
  Filled 2022-07-22 (×16): qty 0.3

## 2022-07-22 MED ORDER — FENTANYL CITRATE (PF) 250 MCG/5ML IJ SOLN
INTRAMUSCULAR | Status: DC | PRN
  Administered 2022-07-22: 250 ug via INTRAVENOUS

## 2022-07-22 MED ORDER — CEFAZOLIN SODIUM-DEXTROSE 2-4 GM/100ML-% IV SOLN
2.0000 g | Freq: Once | INTRAVENOUS | Status: DC
  Filled 2022-07-22: qty 100

## 2022-07-22 MED ORDER — FENTANYL 2500MCG IN NS 250ML (10MCG/ML) PREMIX INFUSION
50.0000 ug/h | INTRAVENOUS | Status: DC
  Administered 2022-07-22 (×2): 100 ug/h via INTRAVENOUS
  Administered 2022-07-22: 150 ug/h via INTRAVENOUS
  Administered 2022-07-23 – 2022-07-26 (×9): 200 ug/h via INTRAVENOUS
  Administered 2022-07-27: 150 ug/h via INTRAVENOUS
  Administered 2022-07-27: 200 ug/h via INTRAVENOUS
  Filled 2022-07-22 (×13): qty 250

## 2022-07-22 MED ORDER — PROPOFOL 1000 MG/100ML IV EMUL
0.0000 ug/kg/min | INTRAVENOUS | Status: DC
  Administered 2022-07-22: 50 ug/kg/min via INTRAVENOUS
  Administered 2022-07-22: 75 ug/kg/min via INTRAVENOUS
  Administered 2022-07-22 – 2022-07-23 (×4): 50 ug/kg/min via INTRAVENOUS
  Filled 2022-07-22 (×5): qty 100

## 2022-07-22 MED ORDER — HALOPERIDOL LACTATE 5 MG/ML IJ SOLN: 1.0000 mg | Freq: Four times a day (QID) | INTRAMUSCULAR | Status: DC | PRN

## 2022-07-22 MED ORDER — ATROPINE SULFATE 1 MG/ML IV SOLN
1.0000 mg | Freq: Once | INTRAVENOUS | Status: AC
  Administered 2022-07-22: 1 mg via INTRAVENOUS

## 2022-07-22 MED ORDER — NOREPINEPHRINE 4 MG/250ML-% IV SOLN
INTRAVENOUS | Status: AC
  Administered 2022-07-22: 25 ug/min via INTRAVENOUS
  Filled 2022-07-22: qty 250

## 2022-07-22 MED ORDER — DEXAMETHASONE SODIUM PHOSPHATE 10 MG/ML IJ SOLN
INTRAMUSCULAR | Status: DC | PRN
  Administered 2022-07-22: 5 mg via INTRAVENOUS

## 2022-07-22 MED ORDER — 0.9 % SODIUM CHLORIDE (POUR BTL) OPTIME
TOPICAL | Status: DC | PRN
  Administered 2022-07-22: 3000 mL

## 2022-07-22 SURGICAL SUPPLY — 52 items
APL PRP STRL LF DISP 70% ISPRP (MISCELLANEOUS) ×2
BLADE CLIPPER SURG (BLADE) IMPLANT
CANISTER SUCT 3000ML PPV (MISCELLANEOUS) ×2 IMPLANT
CANISTER WOUND CARE 500ML ATS (WOUND CARE) IMPLANT
CATH ROBINSON RED A/P 16FR (CATHETERS) IMPLANT
CHLORAPREP W/TINT 26 (MISCELLANEOUS) ×2 IMPLANT
COVER SURGICAL LIGHT HANDLE (MISCELLANEOUS) ×2 IMPLANT
DRAPE LAPAROSCOPIC ABDOMINAL (DRAPES) ×2 IMPLANT
DRAPE WARM FLUID 44X44 (DRAPES) ×2 IMPLANT
DRSG OPSITE POSTOP 4X10 (GAUZE/BANDAGES/DRESSINGS) IMPLANT
DRSG OPSITE POSTOP 4X8 (GAUZE/BANDAGES/DRESSINGS) IMPLANT
DRSG VAC GRANUFOAM LG (GAUZE/BANDAGES/DRESSINGS) IMPLANT
ELECT BLADE 6.5 EXT (BLADE) IMPLANT
ELECT CAUTERY BLADE 6.4 (BLADE) ×2 IMPLANT
ELECT REM PT RETURN 9FT ADLT (ELECTROSURGICAL) ×2
ELECTRODE REM PT RTRN 9FT ADLT (ELECTROSURGICAL) ×2 IMPLANT
GLOVE BIO SURGEON STRL SZ7.5 (GLOVE) ×2 IMPLANT
GLOVE BIOGEL PI IND STRL 6.5 (GLOVE) IMPLANT
GLOVE BIOGEL PI IND STRL 7.0 (GLOVE) IMPLANT
GLOVE BIOGEL PI IND STRL 8 (GLOVE) ×2 IMPLANT
GLOVE SURG SS PI 6.5 STRL IVOR (GLOVE) IMPLANT
GLOVE SURG SS PI 7.0 STRL IVOR (GLOVE) IMPLANT
GOWN STRL REUS W/ TWL LRG LVL3 (GOWN DISPOSABLE) ×2 IMPLANT
GOWN STRL REUS W/ TWL XL LVL3 (GOWN DISPOSABLE) ×2 IMPLANT
GOWN STRL REUS W/TWL LRG LVL3 (GOWN DISPOSABLE) ×2
GOWN STRL REUS W/TWL XL LVL3 (GOWN DISPOSABLE) ×2
HANDLE SUCTION POOLE (INSTRUMENTS) ×2 IMPLANT
KIT BASIN OR (CUSTOM PROCEDURE TRAY) ×2 IMPLANT
KIT OSTOMY DRAINABLE 2.75 STR (WOUND CARE) IMPLANT
KIT TURNOVER KIT B (KITS) ×2 IMPLANT
LIGASURE IMPACT 36 18CM CVD LR (INSTRUMENTS) IMPLANT
NS IRRIG 1000ML POUR BTL (IV SOLUTION) ×4 IMPLANT
PACK GENERAL/GYN (CUSTOM PROCEDURE TRAY) ×2 IMPLANT
PAD ARMBOARD 7.5X6 YLW CONV (MISCELLANEOUS) ×2 IMPLANT
PENCIL SMOKE EVACUATOR (MISCELLANEOUS) ×2 IMPLANT
RELOAD PROXIMATE 75MM BLUE (ENDOMECHANICALS) ×20 IMPLANT
RELOAD STAPLE 75 3.8 BLU REG (ENDOMECHANICALS) IMPLANT
SPECIMEN JAR LARGE (MISCELLANEOUS) IMPLANT
SPONGE T-LAP 18X18 ~~LOC~~+RFID (SPONGE) IMPLANT
STAPLER PROXIMATE 75MM BLUE (STAPLE) IMPLANT
STAPLER VISISTAT 35W (STAPLE) ×2 IMPLANT
SUCTION POOLE HANDLE (INSTRUMENTS) ×2
SUT PDS AB 1 TP1 54 (SUTURE) ×4 IMPLANT
SUT SILK 2 0 SH CR/8 (SUTURE) ×2 IMPLANT
SUT SILK 2 0 TIES 10X30 (SUTURE) ×2 IMPLANT
SUT SILK 3 0 SH CR/8 (SUTURE) ×2 IMPLANT
SUT SILK 3 0 TIES 10X30 (SUTURE) ×2 IMPLANT
SUT VIC AB 2-0 SH 18 (SUTURE) IMPLANT
TOWEL GREEN STERILE (TOWEL DISPOSABLE) ×2 IMPLANT
TRAY CHEST TUBE INSERTION DISP (SET/KITS/TRAYS/PACK) IMPLANT
TRAY FOLEY MTR SLVR 16FR STAT (SET/KITS/TRAYS/PACK) ×2 IMPLANT
YANKAUER SUCT BULB TIP NO VENT (SUCTIONS) IMPLANT

## 2022-07-22 NOTE — Progress Notes (Signed)
Patient ID: Shane Klein, male   DOB: 08/21/1990, 32 y.o.   MRN: [id number] I spoke with his mother at the bedside and confirmed his identity. I updated her on the clinical situation and plan of care.  Georganna Skeans, MD, MPH, FACS Please use [email or web address] to contact on call provider
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
## 2022-07-22 NOTE — Procedures (Signed)
Chest tube insertion  Date/Time: 07/22/2022 10:17 AM  Performed by: Georganna Skeans, MD Authorized by: Georganna Skeans, MD   Consent:    Consent obtained:  Emergent situation Pre-procedure details:    Skin preparation:  ChloraPrep Procedure details:    Placement location:  L lateral   Tube size (Fr):  20   Dissection instrument:  Kelly clamp and finger   Tube connected to:  Suction   Suture material:  0 silk  I initially tried to place a pigtail but the wire did not thread so I placed a 20FR chest tube. He coded at the beginning of the procedure. See other notes.  Georganna Skeans, MD, MPH, FACS Please use [email or web address] to contact on call provider

## 2022-07-22 NOTE — Anesthesia Procedure Notes (Signed)
Procedure Name: Intubation Date/Time: 07/22/2022 4:04 AM  Performed by: Trinna Post., CRNAPre-anesthesia Checklist: Patient identified, Emergency Drugs available, Suction available, Patient being monitored and Timeout performed Patient Re-evaluated:Patient Re-evaluated prior to induction Oxygen Delivery Method: Circle system utilized Preoxygenation: Pre-oxygenation with 100% oxygen Induction Type: IV induction, Rapid sequence and Cricoid Pressure applied Laryngoscope Size: Mac and 4 Grade View: Grade II Tube type: Oral Tube size: 7.5 mm Number of attempts: 1 Airway Equipment and Method: Stylet Placement Confirmation: ETT inserted through vocal cords under direct vision, positive ETCO2 and breath sounds checked- equal and bilateral Secured at: 22 cm Tube secured with: Tape Dental Injury: Teeth and Oropharynx as per pre-operative assessment

## 2022-07-22 NOTE — ED Notes (Signed)
Trauma MD Bethann Berkshire arrives to bedside
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
## 2022-07-22 NOTE — Progress Notes (Signed)
Trauma Event Note   Reason for Call : Pt cardiac arrested during chest tube placement.  Initial Focused Assessment: Pt unresponsive and intubated SBP low 90's.  Interventions: - CPR and shocks given to pt - ROSC obtained. (Please see code note) - Chest tube suction increased to -40. (Marked at 527ml) - Levo gtt initiated  - Myself and Heather found mother's correct name and number and called her.  She is now at bedside and has been updated by Dr. Grandville Silos. - albumin given to pt - Spoke with pt's family and went over ICU rules for confidential pt.  - bear hugger applied to pt due to low temp.  Plan of Care: Continue plan of care of keeping pt hemodynamically stable.   Event Summary: During chest tube placement with Dr Grandville Silos, patient went into pulseless [facility] at 0957. Compressions immediately started and zoll pads placed on patient. See code blue note for further details.   Last imported Vital Signs BP (!) 107/58   Pulse 69   Temp 100 F (37.8 C) (Esophageal)   Resp 17   Ht 5\' 10"  (1.778 m)   Wt 115 lb (52.2 kg)   SpO2 100%   BMI 16.50 kg/m   Trending CBC Recent Labs    07/22/22 0327 07/22/22 0339 07/22/22 0614 07/22/22 0854 07/22/22 1447  WBC 13.2*  --   --  5.8 10.7*  HGB 7.2*   < > 9.2* 8.6*  7.8* 10.5*  HCT 23.9*   < > 27.0* 24.7*  23.0* 31.3*  PLT 649*  --   --  264 260   < > = values in this interval not displayed.    Trending Coag's Recent Labs    07/22/22 0327  INR 1.1    Trending BMET Recent Labs    07/22/22 0327 07/22/22 0339 07/22/22 0439 07/22/22 0614 07/22/22 0854  NA 138 140 137 139 138  140  K 3.6 3.6 4.8 4.8 4.3  4.5  CL 105 106  --   --  110  CO2 15*  --   --   --  20*  BUN 22* 22*  --   --  22*  CREATININE 1.78* 1.80*  --   --  1.25*  GLUCOSE 185* 177*  --   --  191*    Zenaya Ulatowski W  Trauma Response RN  Please call TRN at [phone] for further assistance.

## 2022-07-22 NOTE — Progress Notes (Signed)
Patient ID: Shane Klein, male   DOB: 03/25/1991, 32 y.o.   MRN: [id number] [place] investigation of his steroid taper. Methylprednisolone while on the vent. Once taking PO will plan prednosine 20mg  for 5d then 10mg  for 5d, then 5mg  for 5d then off.   He has a L hemopneumothorax better visualized on CT this AM. Will proceed with emergent placement L pigtail. The number for his mother in his old chart is no longer valid. WIll continue to look for family.  Georganna Skeans, MD, MPH, FACS Please use [email or web address] to contact on call provider

## 2022-07-22 NOTE — Progress Notes (Signed)
Orthopedic Tech Progress Note Patient Details:  Shane Klein November 06, 1990 [id number]  Ortho Devices Type of Ortho Device: Long arm splint Ortho Device/Splint Location: RUE Ortho Device/Splint Interventions: Ordered, Application, Adjustment     Emergent splint applied before patient went to the OR to immobilize his arm. Vernona Rieger 07/22/2022, 4:04 AM
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
## 2022-07-22 NOTE — Procedures (Signed)
Emergently, at the bedside in trauma bay B, the patient's right neck was prepped and draped in usual sterile fashion.  A timeout was completed verifying the correct patient, procedure, position, and equipment needed for the case.  A seeker needle was inserted into the right internal jugular vein and dark blood return.  A wire was passed through this needle and the needle removed.  A skin nick was created at the entry site of the wire and then the dilator was used with the Cordis introducer to dilate the tract and placed the catheter in place.  The wire and dilator were removed and the line was fixed to the skin using a silk suture.  It drew dark blood and flushed.  A dressing was applied.  The patient tolerated the procedure well.  Felicie Morn, MD General, Bariatric and Minimally Invasive Surgery [facility]

## 2022-07-22 NOTE — Progress Notes (Addendum)
CODE BLUE PROGRESS NOTE   During chest tube placement with Dr Grandville Silos, patient went into pulseless [facility] at 0957. Compressions immediately started and zoll pads placed on patient.  0957: pulseless [facility] 0959: VFIB - no shock advised 1000: 1 mg epi given, VFIB, shock 1002: 300 mg amiodarone given, no pulse 1003: 1 mg epi given 1004: 1 amp bicarb given, no pulse 1005: ROSC, sinus rhythm with pulse  1006: 1 g calcium given 1008: 1 mg atropine given for bradycardia  1016: 10 mcg levo gtt started   Present for code:  Primary RN: Montez Hageman, RN RRT: Eugene Gavia, RRT Recorder: Leverne Humbles RN MD: Georganna Skeans, MD  Other: Judithe Modest RN, Candise Bowens RN, Marissa RN, Delfin Gant RN, Wilburn Cornelia RN (rapid response), Judie Grieve (Student RN)   Montez Hageman RN

## 2022-07-22 NOTE — Anesthesia Postprocedure Evaluation (Signed)
Anesthesia Post Note  Patient: Shane Klein  Procedure(s) Performed: EXPLORATORY LAPAROTOMY (Abdomen) SMALL BOWEL RESECTION (Abdomen) ILEO LOOP COLOSTOMY (Abdomen)     Patient location during evaluation: ICU Anesthesia Type: General Level of consciousness: patient remains intubated per anesthesia plan Pain management: pain level controlled Vital Signs Assessment: post-procedure vital signs reviewed and stable Respiratory status: spontaneous breathing, patient remains intubated per anesthesia plan and patient on ventilator - see flowsheet for VS Cardiovascular status: stable Postop Assessment: no apparent nausea or vomiting Anesthetic complications: no  No notable events documented.  Last Vitals:  Vitals:   07/22/22 0322 07/22/22 0335  BP:  (!) 92/54  Pulse:  (!) 101  Resp:  (!) 28  Temp:  36.4 C  SpO2: 95% 100%    Last Pain:  Vitals:   07/22/22 0335  TempSrc: Oral  PainSc:                  Huston Foley

## 2022-07-22 NOTE — Progress Notes (Addendum)
This RN received a call from a nurse from another unit stating that the "father of the patient" is upset and wanting to come up and see the patient. I explained to the nurse that this patient is a confidential patient and ONLY 2 visitors, the mother and one of the sisters, are allowed to be visitors for the patient during the entirety of the hospital stay. I called the patient's mother, Sandie Ano @ [phone], and informed her about the patient's father attempting to come up here and I also reinforced the visitation rules for a confidential patient. Mother verbalized understanding. She is one of the patient's designated visitors along with one of the patient's sisters - to be determined which sister at this time. Unit Network engineer and charge RN notified.   Montez Hageman RN

## 2022-07-22 NOTE — Progress Notes (Signed)
RT and RN transported pt to ct scan and back without complications.
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
## 2022-07-22 NOTE — ED Notes (Signed)
Pt transported to OR

## 2022-07-22 NOTE — Progress Notes (Signed)
   07/22/22 1000  Spiritual Encounters  Type of Visit Attempt (pt unavailable)  Reason for visit Code  OnCall Visit No   Chaplain Tylia Ewell responded to a code blue. No family was present. Chaplain let rapid response nurse know to tell the patient's nurses to page when/if family arrived.   Note Prepared by Abbott Pao, Chaplain Resident [phone]

## 2022-07-22 NOTE — Transfer of Care (Signed)
Immediate Anesthesia Transfer of Care Note  Patient: Shane Klein  Procedure(s) Performed: EXPLORATORY LAPAROTOMY (Abdomen) SMALL BOWEL RESECTION (Abdomen) ILEO LOOP COLOSTOMY (Abdomen)  Patient Location: ICU  Anesthesia Type:General  Level of Consciousness: sedated and Patient remains intubated per anesthesia plan  Airway & Oxygen Therapy: Patient remains intubated per anesthesia plan and Patient placed on Ventilator (see vital sign flow sheet for setting)  Post-op Assessment: Report given to RN and Post -op Vital signs reviewed and stable  Post vital signs: Reviewed and stable  Last Vitals:  Vitals Value Taken Time  BP 114/92 07/22/22 0800  Temp    Pulse 78 07/22/22 0753  Resp 16 07/22/22 0800  SpO2 99 % 07/22/22 0753  Vitals shown include unvalidated device data.  Last Pain:  Vitals:   07/22/22 0335  TempSrc: Oral  PainSc:          Complications: No notable events documented.

## 2022-07-22 NOTE — Progress Notes (Signed)
Patient ID: Shane Klein, male   DOB: 09-30-1990, 32 y.o.   MRN: [id number] As I was beginning to place a L chest tube for hemopneumothorax, he went into a fast cardiac rhythm without pulse. CPR started and after meds and cardioversion X 1 he regained a pulse. Bedside U/S showed no significant pericardial effusion. Levophed going now. Will give 2u PRBC. Labs P. Additional critical care time 72min.  Georganna Skeans, MD, MPH, FACS Please use [email or web address] to contact on call provider

## 2022-07-23 ENCOUNTER — Inpatient Hospital Stay (HOSPITAL_COMMUNITY): Payer: Medicaid Other

## 2022-07-23 ENCOUNTER — Encounter (HOSPITAL_COMMUNITY): Admission: EM | Disposition: A | Discharge status: Rehabilitation Facility | Payer: Self-pay | Source: Home / Self Care

## 2022-07-23 LAB — BPAM RBC
Blood Product Expiration Date: 202404052359
Blood Product Expiration Date: 202404052359
Blood Product Expiration Date: 202404072359
Blood Product Expiration Date: 202404232359
Blood Product Expiration Date: 202404232359
Blood Product Expiration Date: 202404232359
Blood Product Expiration Date: 202404242359
ISSUE DATE / TIME: 202403280331
ISSUE DATE / TIME: 202403280343
ISSUE DATE / TIME: 202403280355
ISSUE DATE / TIME: 202403280417
ISSUE DATE / TIME: 202403281355
ISSUE DATE / TIME: 202403281421
ISSUE DATE / TIME: 202403281705
Unit Type and Rh: 5100
Unit Type and Rh: 5100
Unit Type and Rh: 5100
Unit Type and Rh: 5100
Unit Type and Rh: 5100
Unit Type and Rh: 5100
Unit Type and Rh: 5100

## 2022-07-23 LAB — CBC
HCT: 26.5 % — ABNORMAL LOW (ref 39.0–52.0)
HCT: 23.1 % — ABNORMAL LOW (ref 39.0–52.0)
Hemoglobin: 9.3 g/dL — ABNORMAL LOW (ref 13.0–17.0)
Hemoglobin: 7.6 g/dL — ABNORMAL LOW (ref 13.0–17.0)
MCH: 27.4 pg (ref 26.0–34.0)
MCH: 26.4 pg (ref 26.0–34.0)
MCHC: 35.1 g/dL (ref 30.0–36.0)
MCHC: 32.9 g/dL (ref 30.0–36.0)
MCV: 78.2 fL — ABNORMAL LOW (ref 80.0–100.0)
MCV: 80.2 fL (ref 80.0–100.0)
Platelets: 211 10*3/uL (ref 150–400)
Platelets: 200 10*3/uL (ref 150–400)
RBC: 3.39 MIL/uL — ABNORMAL LOW (ref 4.22–5.81)
RBC: 2.88 MIL/uL — ABNORMAL LOW (ref 4.22–5.81)
RDW: 20.6 % — ABNORMAL HIGH (ref 11.5–15.5)
RDW: 21 % — ABNORMAL HIGH (ref 11.5–15.5)
WBC: 15.9 10*3/uL — ABNORMAL HIGH (ref 4.0–10.5)
WBC: 14 10*3/uL — ABNORMAL HIGH (ref 4.0–10.5)
nRBC: 0.3 % — ABNORMAL HIGH (ref 0.0–0.2)
nRBC: 0.1 % (ref 0.0–0.2)

## 2022-07-23 LAB — TYPE AND SCREEN
ABO/RH(D): A POS
Antibody Screen: NEGATIVE
Unit division: 0
Unit division: 0
Unit division: 0
Unit division: 0
Unit division: 0
Unit division: 0
Unit division: 0

## 2022-07-23 LAB — BASIC METABOLIC PANEL
Anion gap: 10 (ref 5–15)
BUN: 26 mg/dL — ABNORMAL HIGH (ref 6–20)
CO2: 23 mmol/L (ref 22–32)
Calcium: 8.2 mg/dL — ABNORMAL LOW (ref 8.9–10.3)
Chloride: 104 mmol/L (ref 98–111)
Creatinine, Ser: 1.49 mg/dL — ABNORMAL HIGH (ref 0.61–1.24)
GFR, Estimated: >60 mL/min (ref >60)
Glucose, Bld: 98 mg/dL (ref 70–99)
Potassium: 5.3 mmol/L — ABNORMAL HIGH (ref 3.5–5.1)
Sodium: 137 mmol/L (ref 135–145)

## 2022-07-23 LAB — SURGICAL PCR SCREEN
MRSA, PCR: NEGATIVE
Staphylococcus aureus: POSITIVE — AB

## 2022-07-23 LAB — TRIGLYCERIDES: Triglycerides: 180 mg/dL — ABNORMAL HIGH (ref <150)

## 2022-07-23 SURGERY — OPEN REDUCTION INTERNAL FIXATION (ORIF) DISTAL HUMERUS FRACTURE
Laterality: Right

## 2022-07-23 MED ORDER — TRANEXAMIC ACID-NACL 1000-0.7 MG/100ML-% IV SOLN: 1000.0000 mg | INTRAVENOUS | Status: DC

## 2022-07-23 MED ORDER — SODIUM ZIRCONIUM CYCLOSILICATE 10 G PO PACK
10.0000 g | PACK | Freq: Once | ORAL | Status: AC
  Administered 2022-07-23: 10 g
  Filled 2022-07-23: qty 1

## 2022-07-23 MED ORDER — OXYCODONE HCL 5 MG PO TABS
5.0000 mg | ORAL_TABLET | ORAL | Status: DC | PRN
  Administered 2022-07-24 – 2022-08-02 (×25): 10 mg
  Filled 2022-07-23 (×27): qty 2

## 2022-07-23 MED ORDER — CHLORHEXIDINE GLUCONATE 4 % EX LIQD
60.0000 mL | Freq: Once | CUTANEOUS | Status: AC
  Administered 2022-07-23: 4 via TOPICAL
  Filled 2022-07-23: qty 60

## 2022-07-23 MED ORDER — METHOCARBAMOL 500 MG PO TABS
1000.0000 mg | ORAL_TABLET | Freq: Three times a day (TID) | ORAL | Status: DC
  Administered 2022-07-23 – 2022-07-31 (×25): 1000 mg
  Filled 2022-07-23 (×25): qty 2

## 2022-07-23 MED ORDER — ACETAMINOPHEN 500 MG PO TABS
1000.0000 mg | ORAL_TABLET | Freq: Four times a day (QID) | ORAL | Status: DC
  Administered 2022-07-23 – 2022-07-24 (×4): 1000 mg via ORAL
  Filled 2022-07-23 (×5): qty 2

## 2022-07-23 MED ORDER — CALCIUM GLUCONATE-NACL 1-0.675 GM/50ML-% IV SOLN
1.0000 g | Freq: Once | INTRAVENOUS | Status: AC
  Administered 2022-07-23: 1000 mg via INTRAVENOUS
  Filled 2022-07-23: qty 50

## 2022-07-23 MED ORDER — POLYETHYLENE GLYCOL 3350 17 G PO PACK: 17.0000 g | PACK | Freq: Every day | ORAL | Status: DC

## 2022-07-23 MED ORDER — MIDAZOLAM BOLUS VIA INFUSION
0.0000 mg | INTRAVENOUS | Status: DC | PRN
  Administered 2022-07-26: 2 mg via INTRAVENOUS
  Administered 2022-07-26: 3 mg via INTRAVENOUS
  Administered 2022-07-27 (×3): 2 mg via INTRAVENOUS
  Administered 2022-07-27: 5 mg via INTRAVENOUS

## 2022-07-23 MED ORDER — HYDROCORTISONE SOD SUC (PF) 100 MG IJ SOLR
100.0000 mg | Freq: Three times a day (TID) | INTRAMUSCULAR | Status: DC
  Administered 2022-07-23 – 2022-07-28 (×15): 100 mg via INTRAVENOUS
  Filled 2022-07-23 (×15): qty 2

## 2022-07-23 MED ORDER — MUPIROCIN 2 % EX OINT
1.0000 | TOPICAL_OINTMENT | Freq: Two times a day (BID) | CUTANEOUS | Status: AC
  Administered 2022-07-23 – 2022-07-27 (×10): 1 via NASAL
  Filled 2022-07-23 (×2): qty 22

## 2022-07-23 MED ORDER — POVIDONE-IODINE 10 % EX SWAB: 2.0000 | Freq: Once | CUTANEOUS | Status: DC

## 2022-07-23 MED ORDER — NOREPINEPHRINE 16 MG/250ML-% IV SOLN
0.0000 ug/min | INTRAVENOUS | Status: DC
  Administered 2022-07-23: 15 ug/min via INTRAVENOUS
  Administered 2022-07-23: 25 ug/min via INTRAVENOUS
  Administered 2022-07-24: 6 ug/min via INTRAVENOUS
  Filled 2022-07-23 (×3): qty 250

## 2022-07-23 MED ORDER — MIDAZOLAM-SODIUM CHLORIDE 100-0.9 MG/100ML-% IV SOLN
0.0000 mg/h | INTRAVENOUS | Status: DC
  Administered 2022-07-23: 2 mg/h via INTRAVENOUS
  Administered 2022-07-24: 7 mg/h via INTRAVENOUS
  Administered 2022-07-25: 5 mg/h via INTRAVENOUS
  Administered 2022-07-26 – 2022-07-27 (×2): 2 mg/h via INTRAVENOUS
  Filled 2022-07-23 (×5): qty 100

## 2022-07-23 MED ORDER — LACTATED RINGERS IV BOLUS
2000.0000 mL | Freq: Once | INTRAVENOUS | Status: AC
  Administered 2022-07-23: 2000 mL via INTRAVENOUS

## 2022-07-23 MED ORDER — CEFAZOLIN SODIUM-DEXTROSE 2-4 GM/100ML-% IV SOLN: 2.0000 g | INTRAVENOUS | Status: AC
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
## 2022-07-24 ENCOUNTER — Inpatient Hospital Stay (HOSPITAL_COMMUNITY): Payer: Medicaid Other

## 2022-07-24 DIAGNOSIS — E440 Moderate protein-calorie malnutrition: Secondary | ICD-10-CM | POA: Insufficient documentation

## 2022-07-24 LAB — PHOSPHORUS
Phosphorus: 3.6 mg/dL (ref 2.5–4.6)
Phosphorus: 3.2 mg/dL (ref 2.5–4.6)

## 2022-07-24 LAB — CBC
HCT: 23.1 % — ABNORMAL LOW (ref 39.0–52.0)
Hemoglobin: 7.6 g/dL — ABNORMAL LOW (ref 13.0–17.0)
MCH: 26.5 pg (ref 26.0–34.0)
MCHC: 32.9 g/dL (ref 30.0–36.0)
MCV: 80.5 fL (ref 80.0–100.0)
Platelets: 219 10*3/uL (ref 150–400)
RBC: 2.87 MIL/uL — ABNORMAL LOW (ref 4.22–5.81)
RDW: 20.8 % — ABNORMAL HIGH (ref 11.5–15.5)
WBC: 13.9 10*3/uL — ABNORMAL HIGH (ref 4.0–10.5)
nRBC: 0 % (ref 0.0–0.2)

## 2022-07-24 LAB — GLUCOSE, CAPILLARY
Glucose-Capillary: 103 mg/dL — ABNORMAL HIGH (ref 70–99)
Glucose-Capillary: 108 mg/dL — ABNORMAL HIGH (ref 70–99)
Glucose-Capillary: 114 mg/dL — ABNORMAL HIGH (ref 70–99)

## 2022-07-24 LAB — BASIC METABOLIC PANEL
Anion gap: 8 (ref 5–15)
BUN: 26 mg/dL — ABNORMAL HIGH (ref 6–20)
CO2: 24 mmol/L (ref 22–32)
Calcium: 8.2 mg/dL — ABNORMAL LOW (ref 8.9–10.3)
Chloride: 107 mmol/L (ref 98–111)
Creatinine, Ser: 1.2 mg/dL (ref 0.61–1.24)
GFR, Estimated: >60 mL/min (ref >60)
Glucose, Bld: 105 mg/dL — ABNORMAL HIGH (ref 70–99)
Potassium: 4.5 mmol/L (ref 3.5–5.1)
Sodium: 139 mmol/L (ref 135–145)

## 2022-07-24 LAB — MAGNESIUM
Magnesium: 2.1 mg/dL (ref 1.7–2.4)
Magnesium: 2.1 mg/dL (ref 1.7–2.4)

## 2022-07-24 MED ORDER — PANTOPRAZOLE SODIUM 40 MG IV SOLR
40.0000 mg | INTRAVENOUS | Status: DC
  Administered 2022-07-24 – 2022-07-28 (×5): 40 mg via INTRAVENOUS
  Filled 2022-07-24 (×5): qty 10

## 2022-07-24 MED ORDER — INSULIN ASPART 100 UNIT/ML IJ SOLN
0.0000 [IU] | INTRAMUSCULAR | Status: DC
  Administered 2022-07-25 – 2022-08-01 (×9): 2 [IU] via SUBCUTANEOUS

## 2022-07-24 MED ORDER — PIVOT 1.5 CAL PO LIQD
1000.0000 mL | ORAL | Status: DC
  Administered 2022-07-24: 1000 mL

## 2022-07-24 MED ORDER — PROSOURCE TF20 ENFIT COMPATIBL EN LIQD
60.0000 mL | Freq: Every day | ENTERAL | Status: DC
  Administered 2022-07-24 – 2022-07-27 (×4): 60 mL
  Filled 2022-07-24 (×3): qty 60

## 2022-07-24 MED ORDER — ACETAMINOPHEN 500 MG PO TABS
1000.0000 mg | ORAL_TABLET | Freq: Four times a day (QID) | ORAL | Status: DC
  Administered 2022-07-24 – 2022-08-02 (×29): 1000 mg
  Filled 2022-07-24 (×28): qty 2

## 2022-07-24 MED ORDER — PIVOT 1.5 CAL PO LIQD: 1000.0000 mL | ORAL | Status: DC

## 2022-07-24 MED ORDER — VITAL HIGH PROTEIN PO LIQD: 1000.0000 mL | ORAL | Status: DC
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
## 2022-07-24 NOTE — Progress Notes (Addendum)
Audible high pitched suction sound noted coming from chest tube continuously. Chest tube repositioned sound continued intermittently. Minimal air leak unchanged with continuous bubbles in one chamber. Pt VS remained stable. Lung sounds clear bilat diminished in lower lobes. Dr. Zenia Resides paged to bedside. Chest tube repositioned again. See new orders for chest xray.   1738- Subq air noted from L axilla to midback. Dr. Zenia Resides made aware. No new orders at this time.

## 2022-07-24 NOTE — Congregational Nurse Program (Signed)
Pt's ETT was advanced 3 cm per MD order. ETT is now 27 at the lip. No complications noted with advancement.

## 2022-07-24 NOTE — Progress Notes (Addendum)
Notified by RN of continuous air leak noted in chest tube this morning. On my exam there is a continuous leak. Tubing is correctly connected. On clamping the tube at insertion site into the chest the leak resolves. Will repeat CXR to ensure no worsening pneumothorax. Leave chest tube to suction at -40. Sedation has been further weaned and levophed is down to 4. Patient is alert and grimaces but does not follow commands. Begin tube feeds at trophic rate.

## 2022-07-24 NOTE — Progress Notes (Signed)
RT attempted weaning trial ps 8 cpap 5 twice this AM without success. Pt had lack of respirations with both attempts.

## 2022-07-24 NOTE — Progress Notes (Addendum)
Brief Nutrition Note  Received consult for enteral/tube feeding initiation and management per adult tube feed protocol. Patient being followed by RD, last full assessment yesterday, 3/30. RD provided recs at that time.  Patient off propofol. Attempting to wean sedation today.  Per Surgery MD Dr. Zenia Resides, can start tube feeds today and begin advancing towards goal. Will reach out to RN to discuss plan to start tube feeds.    - Initiate tube feeds via NG tube (placement confirmed during surgery) Pivot 1.5 @ 55 ml/hr *Initiate @ 20 ml/hr and increase by 10 ml every 8 hours to goal rate of 55 ml/hr Provides 1980 kcals, 124g protein, and 971mL free water from formula  - Pt is at risk for refeeding recommend monitor magnesium and phosphorus every 12 hours x 4 occurrences.   ADDENDUM (14:34): Per discussion with Dr. Zenia Resides, new plan to only do trickle feeds at 65mL/hr for now. Orders changed. Updated RN.   Samson Frederic RD, LDN For contact information, refer to [facility].

## 2022-07-25 ENCOUNTER — Inpatient Hospital Stay (HOSPITAL_COMMUNITY): Payer: Medicaid Other

## 2022-07-25 LAB — GLUCOSE, CAPILLARY
Glucose-Capillary: 115 mg/dL — ABNORMAL HIGH (ref 70–99)
Glucose-Capillary: 96 mg/dL (ref 70–99)
Glucose-Capillary: 102 mg/dL — ABNORMAL HIGH (ref 70–99)
Glucose-Capillary: 116 mg/dL — ABNORMAL HIGH (ref 70–99)
Glucose-Capillary: 104 mg/dL — ABNORMAL HIGH (ref 70–99)
Glucose-Capillary: 124 mg/dL — ABNORMAL HIGH (ref 70–99)

## 2022-07-25 LAB — BASIC METABOLIC PANEL
Anion gap: 7 (ref 5–15)
BUN: 30 mg/dL — ABNORMAL HIGH (ref 6–20)
CO2: 24 mmol/L (ref 22–32)
Calcium: 8.1 mg/dL — ABNORMAL LOW (ref 8.9–10.3)
Chloride: 110 mmol/L (ref 98–111)
Creatinine, Ser: 1.17 mg/dL (ref 0.61–1.24)
GFR, Estimated: >60 mL/min (ref >60)
Glucose, Bld: 106 mg/dL — ABNORMAL HIGH (ref 70–99)
Potassium: 4.2 mmol/L (ref 3.5–5.1)
Sodium: 141 mmol/L (ref 135–145)

## 2022-07-25 LAB — CBC
HCT: 19.3 % — ABNORMAL LOW (ref 39.0–52.0)
Hemoglobin: 6.6 g/dL — CL (ref 13.0–17.0)
MCH: 27.2 pg (ref 26.0–34.0)
MCHC: 34.2 g/dL (ref 30.0–36.0)
MCV: 79.4 fL — ABNORMAL LOW (ref 80.0–100.0)
Platelets: 245 10*3/uL (ref 150–400)
RBC: 2.43 MIL/uL — ABNORMAL LOW (ref 4.22–5.81)
RDW: 21.2 % — ABNORMAL HIGH (ref 11.5–15.5)
WBC: 9.2 10*3/uL (ref 4.0–10.5)
nRBC: 0.2 % (ref 0.0–0.2)

## 2022-07-25 LAB — PREPARE RBC (CROSSMATCH)

## 2022-07-25 LAB — PHOSPHORUS
Phosphorus: 2.7 mg/dL (ref 2.5–4.6)
Phosphorus: 2.8 mg/dL (ref 2.5–4.6)

## 2022-07-25 LAB — HEMOGLOBIN AND HEMATOCRIT, BLOOD
HCT: 21.2 % — ABNORMAL LOW (ref 39.0–52.0)
Hemoglobin: 7 g/dL — ABNORMAL LOW (ref 13.0–17.0)

## 2022-07-25 LAB — MAGNESIUM
Magnesium: 2.1 mg/dL (ref 1.7–2.4)
Magnesium: 2 mg/dL (ref 1.7–2.4)

## 2022-07-25 MED ORDER — SODIUM CHLORIDE 0.9% IV SOLUTION: Freq: Once | INTRAVENOUS | Status: AC

## 2022-07-25 NOTE — Progress Notes (Signed)
Date and time results received: 07/25/22 0652  Test: hemoglobin Critical Value: 6.6  Name of Provider Notified: Bobbye Morton Trauma MD  Orders Received? Or Actions Taken?: 1 Unit PRBcs   Care Ongoing.  Wyn Quaker, RN
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
## 2022-07-26 ENCOUNTER — Inpatient Hospital Stay (HOSPITAL_COMMUNITY): Payer: Medicaid Other

## 2022-07-26 LAB — BPAM RBC
Blood Product Expiration Date: 202404182359
Blood Product Expiration Date: 202404192359
Blood Product Expiration Date: 202404192359
Blood Product Expiration Date: 202404202359
Blood Product Expiration Date: 202404202359
Blood Product Expiration Date: 202404202359
ISSUE DATE / TIME: 202403280514
ISSUE DATE / TIME: 202403280514
ISSUE DATE / TIME: 202403281032
ISSUE DATE / TIME: 202403281032
ISSUE DATE / TIME: 202403311016
ISSUE DATE / TIME: 202403312041
Unit Type and Rh: 6200
Unit Type and Rh: 6200
Unit Type and Rh: 6200
Unit Type and Rh: 6200
Unit Type and Rh: 6200
Unit Type and Rh: 6200

## 2022-07-26 LAB — GLUCOSE, CAPILLARY
Glucose-Capillary: 110 mg/dL — ABNORMAL HIGH (ref 70–99)
Glucose-Capillary: 110 mg/dL — ABNORMAL HIGH (ref 70–99)
Glucose-Capillary: 124 mg/dL — ABNORMAL HIGH (ref 70–99)
Glucose-Capillary: 120 mg/dL — ABNORMAL HIGH (ref 70–99)
Glucose-Capillary: 112 mg/dL — ABNORMAL HIGH (ref 70–99)
Glucose-Capillary: 117 mg/dL — ABNORMAL HIGH (ref 70–99)

## 2022-07-26 LAB — BASIC METABOLIC PANEL
Anion gap: 12 (ref 5–15)
BUN: 29 mg/dL — ABNORMAL HIGH (ref 6–20)
CO2: 23 mmol/L (ref 22–32)
Calcium: 8.3 mg/dL — ABNORMAL LOW (ref 8.9–10.3)
Chloride: 106 mmol/L (ref 98–111)
Creatinine, Ser: 1.04 mg/dL (ref 0.61–1.24)
GFR, Estimated: >60 mL/min (ref >60)
Glucose, Bld: 107 mg/dL — ABNORMAL HIGH (ref 70–99)
Potassium: 3.8 mmol/L (ref 3.5–5.1)
Sodium: 141 mmol/L (ref 135–145)

## 2022-07-26 LAB — TYPE AND SCREEN
ABO/RH(D): A POS
Antibody Screen: NEGATIVE
Unit division: 0
Unit division: 0
Unit division: 0
Unit division: 0
Unit division: 0
Unit division: 0

## 2022-07-26 LAB — CBC
HCT: 24.9 % — ABNORMAL LOW (ref 39.0–52.0)
Hemoglobin: 8.3 g/dL — ABNORMAL LOW (ref 13.0–17.0)
MCH: 27.7 pg (ref 26.0–34.0)
MCHC: 33.3 g/dL (ref 30.0–36.0)
MCV: 83 fL (ref 80.0–100.0)
Platelets: 279 10*3/uL (ref 150–400)
RBC: 3 MIL/uL — ABNORMAL LOW (ref 4.22–5.81)
RDW: 19.4 % — ABNORMAL HIGH (ref 11.5–15.5)
WBC: 10.8 10*3/uL — ABNORMAL HIGH (ref 4.0–10.5)
nRBC: 0 % (ref 0.0–0.2)

## 2022-07-26 LAB — PATHOLOGIST SMEAR REVIEW: Path Review: NEGATIVE

## 2022-07-26 MED ORDER — PIVOT 1.5 CAL PO LIQD: 1000.0000 mL | ORAL | Status: DC

## 2022-07-26 MED ORDER — QUETIAPINE FUMARATE 25 MG PO TABS
25.0000 mg | ORAL_TABLET | Freq: Two times a day (BID) | ORAL | Status: DC
  Administered 2022-07-26 – 2022-07-28 (×5): 25 mg via ORAL
  Filled 2022-07-26 (×5): qty 1
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
## 2022-07-26 NOTE — Progress Notes (Signed)
Patient ID: Shane Klein, male   DOB: 16-Jul-1990, 32 y.o.   MRN: [id number] When nursing changed his VAC dressing they noted that some daily ostomy output had leaked out into the wound.  There were some discolored area at the inferior most portion of his wound.  I came and removed the new VAC to investigate further.  It looks like he has some superficial necrotic fascia at the very base of the wound.  It is an area of about 2 cm.  I cleaned the area and placed a piece of petrolatum gauze over the area and change the wound to wet-to-dry.  We placed a new ostomy appliance to try and optimize preventing the ostomy output from getting into his wound.  Plan dressing change every shift.  Georganna Skeans, MD, MPH, FACS Please use [email or web address] to contact on call provider

## 2022-07-27 ENCOUNTER — Encounter (HOSPITAL_COMMUNITY): Admission: EM | Disposition: A | Discharge status: Rehabilitation Facility | Payer: Self-pay | Source: Home / Self Care

## 2022-07-27 ENCOUNTER — Inpatient Hospital Stay (HOSPITAL_COMMUNITY): Payer: Medicaid Other

## 2022-07-27 ENCOUNTER — Inpatient Hospital Stay (HOSPITAL_COMMUNITY): Payer: Medicaid Other | Admitting: Anesthesiology

## 2022-07-27 ENCOUNTER — Other Ambulatory Visit: Payer: Self-pay

## 2022-07-27 ENCOUNTER — Encounter (HOSPITAL_COMMUNITY): Payer: Self-pay | Admitting: Orthopedic Surgery

## 2022-07-27 DIAGNOSIS — D649 Anemia, unspecified: Secondary | ICD-10-CM | POA: Diagnosis not present

## 2022-07-27 DIAGNOSIS — S42401A Unspecified fracture of lower end of right humerus, initial encounter for closed fracture: Secondary | ICD-10-CM | POA: Diagnosis not present

## 2022-07-27 HISTORY — PX: ORIF HUMERUS FRACTURE: SHX2126

## 2022-07-27 LAB — BASIC METABOLIC PANEL
Anion gap: 10 (ref 5–15)
Anion gap: 6 (ref 5–15)
BUN: 32 mg/dL — ABNORMAL HIGH (ref 6–20)
BUN: 28 mg/dL — ABNORMAL HIGH (ref 6–20)
CO2: 23 mmol/L (ref 22–32)
CO2: 24 mmol/L (ref 22–32)
Calcium: 8.4 mg/dL — ABNORMAL LOW (ref 8.9–10.3)
Calcium: 8.1 mg/dL — ABNORMAL LOW (ref 8.9–10.3)
Chloride: 112 mmol/L — ABNORMAL HIGH (ref 98–111)
Chloride: 114 mmol/L — ABNORMAL HIGH (ref 98–111)
Creatinine, Ser: 0.7 mg/dL (ref 0.61–1.24)
Creatinine, Ser: 0.78 mg/dL (ref 0.61–1.24)
GFR, Estimated: >60 mL/min (ref >60)
GFR, Estimated: >60 mL/min (ref >60)
Glucose, Bld: 125 mg/dL — ABNORMAL HIGH (ref 70–99)
Glucose, Bld: 99 mg/dL (ref 70–99)
Potassium: 3.9 mmol/L (ref 3.5–5.1)
Potassium: 3.6 mmol/L (ref 3.5–5.1)
Sodium: 145 mmol/L (ref 135–145)
Sodium: 144 mmol/L (ref 135–145)

## 2022-07-27 LAB — CBC
HCT: 22.9 % — ABNORMAL LOW (ref 39.0–52.0)
HCT: 26.6 % — ABNORMAL LOW (ref 39.0–52.0)
Hemoglobin: 7.5 g/dL — ABNORMAL LOW (ref 13.0–17.0)
Hemoglobin: 8.8 g/dL — ABNORMAL LOW (ref 13.0–17.0)
MCH: 27.7 pg (ref 26.0–34.0)
MCH: 28.7 pg (ref 26.0–34.0)
MCHC: 32.8 g/dL (ref 30.0–36.0)
MCHC: 33.1 g/dL (ref 30.0–36.0)
MCV: 84.5 fL (ref 80.0–100.0)
MCV: 86.6 fL (ref 80.0–100.0)
Platelets: 316 10*3/uL (ref 150–400)
Platelets: 322 10*3/uL (ref 150–400)
RBC: 2.71 MIL/uL — ABNORMAL LOW (ref 4.22–5.81)
RBC: 3.07 MIL/uL — ABNORMAL LOW (ref 4.22–5.81)
RDW: 20.1 % — ABNORMAL HIGH (ref 11.5–15.5)
RDW: 19.6 % — ABNORMAL HIGH (ref 11.5–15.5)
WBC: 9.4 10*3/uL (ref 4.0–10.5)
WBC: 6.5 10*3/uL (ref 4.0–10.5)
nRBC: 0 % (ref 0.0–0.2)
nRBC: 0 % (ref 0.0–0.2)

## 2022-07-27 LAB — GLUCOSE, CAPILLARY
Glucose-Capillary: 120 mg/dL — ABNORMAL HIGH (ref 70–99)
Glucose-Capillary: 104 mg/dL — ABNORMAL HIGH (ref 70–99)
Glucose-Capillary: 107 mg/dL — ABNORMAL HIGH (ref 70–99)
Glucose-Capillary: 83 mg/dL (ref 70–99)
Glucose-Capillary: 99 mg/dL (ref 70–99)

## 2022-07-27 LAB — PREPARE RBC (CROSSMATCH)

## 2022-07-27 SURGERY — OPEN REDUCTION INTERNAL FIXATION (ORIF) DISTAL HUMERUS FRACTURE
Anesthesia: General | Site: Arm Lower | Laterality: Right

## 2022-07-27 MED ORDER — PROPOFOL 10 MG/ML IV BOLUS
INTRAVENOUS | Status: AC
  Filled 2022-07-27: qty 20

## 2022-07-27 MED ORDER — 0.9 % SODIUM CHLORIDE (POUR BTL) OPTIME
TOPICAL | Status: DC | PRN
  Administered 2022-07-27: 1000 mL

## 2022-07-27 MED ORDER — MIDAZOLAM HCL 2 MG/2ML IJ SOLN
INTRAMUSCULAR | Status: DC | PRN
  Administered 2022-07-27: 2 mg via INTRAVENOUS

## 2022-07-27 MED ORDER — FENTANYL CITRATE (PF) 250 MCG/5ML IJ SOLN
INTRAMUSCULAR | Status: DC | PRN
  Administered 2022-07-27: 50 ug via INTRAVENOUS

## 2022-07-27 MED ORDER — SODIUM CHLORIDE 0.9 % IV SOLN
2.0000 g | INTRAVENOUS | Status: AC
  Administered 2022-07-27 – 2022-07-28 (×2): 2 g via INTRAVENOUS
  Filled 2022-07-27 (×2): qty 20

## 2022-07-27 MED ORDER — SODIUM CHLORIDE 0.9% IV SOLUTION: Freq: Once | INTRAVENOUS | Status: AC

## 2022-07-27 MED ORDER — VANCOMYCIN HCL 1000 MG IV SOLR
INTRAVENOUS | Status: DC | PRN
  Administered 2022-07-27: 1000 mg via TOPICAL

## 2022-07-27 MED ORDER — MIDAZOLAM HCL 2 MG/2ML IJ SOLN
INTRAMUSCULAR | Status: AC
  Filled 2022-07-27: qty 2

## 2022-07-27 MED ORDER — CEFAZOLIN SODIUM-DEXTROSE 2-3 GM-%(50ML) IV SOLR
INTRAVENOUS | Status: DC | PRN
  Administered 2022-07-27: 2 g via INTRAVENOUS

## 2022-07-27 MED ORDER — PIVOT 1.5 CAL PO LIQD
1000.0000 mL | ORAL | Status: DC
  Administered 2022-07-27: 1000 mL

## 2022-07-27 MED ORDER — VANCOMYCIN HCL 1000 MG IV SOLR
INTRAVENOUS | Status: AC
  Filled 2022-07-27: qty 20

## 2022-07-27 MED ORDER — ONDANSETRON HCL 4 MG/2ML IJ SOLN
INTRAMUSCULAR | Status: DC | PRN
  Administered 2022-07-27: 4 mg via INTRAVENOUS

## 2022-07-27 MED ORDER — ROCURONIUM BROMIDE 10 MG/ML (PF) SYRINGE
PREFILLED_SYRINGE | INTRAVENOUS | Status: DC | PRN
  Administered 2022-07-27 (×2): 50 mg via INTRAVENOUS
  Administered 2022-07-27: 30 mg via INTRAVENOUS
  Administered 2022-07-27: 20 mg via INTRAVENOUS
  Administered 2022-07-27: 50 mg via INTRAVENOUS

## 2022-07-27 MED ORDER — ALBUMIN HUMAN 5 % IV SOLN: INTRAVENOUS | Status: DC | PRN

## 2022-07-27 MED ORDER — FENTANYL CITRATE (PF) 250 MCG/5ML IJ SOLN
INTRAMUSCULAR | Status: AC
  Filled 2022-07-27: qty 5

## 2022-07-27 MED ORDER — LACTATED RINGERS IV SOLN: INTRAVENOUS | Status: DC | PRN

## 2022-07-27 SURGICAL SUPPLY — 80 items
BAG COUNTER SPONGE SURGICOUNT (BAG) ×1 IMPLANT
BAG SPNG CNTER NS LX DISP (BAG) ×1
BIT DRILL 2.5X110 QC LCP DISP (BIT) IMPLANT
BIT DRILL 2.8 (BIT) ×1
BIT DRILL CANN QC 2.8X165 (BIT) IMPLANT
BLADE AVERAGE 25X9 (BLADE) IMPLANT
BNDG CMPR 9X4 STRL LF SNTH (GAUZE/BANDAGES/DRESSINGS)
BNDG COHESIVE 4X5 TAN STRL (GAUZE/BANDAGES/DRESSINGS) ×1 IMPLANT
BNDG ELASTIC 4X5.8 VLCR STR LF (GAUZE/BANDAGES/DRESSINGS) IMPLANT
BNDG ESMARK 4X9 LF (GAUZE/BANDAGES/DRESSINGS) IMPLANT
BNDG GAUZE DERMACEA FLUFF 4 (GAUZE/BANDAGES/DRESSINGS) ×2 IMPLANT
BNDG GZE DERMACEA 4 6PLY (GAUZE/BANDAGES/DRESSINGS) ×2
BRUSH SCRUB EZ PLAIN DRY (MISCELLANEOUS) ×2 IMPLANT
CORD BIPOLAR FORCEPS 12FT (ELECTRODE) IMPLANT
COVER SURGICAL LIGHT HANDLE (MISCELLANEOUS) ×1 IMPLANT
DRAIN PENROSE 12X.25 LTX STRL (MISCELLANEOUS) IMPLANT
DRAPE C-ARM 42X72 X-RAY (DRAPES) ×1 IMPLANT
DRAPE C-ARMOR (DRAPES) IMPLANT
DRAPE HALF SHEET 40X57 (DRAPES) IMPLANT
DRAPE INCISE IOBAN 66X45 STRL (DRAPES) IMPLANT
DRAPE ORTHO SPLIT 77X108 STRL (DRAPES) ×1
DRAPE SURG ORHT 6 SPLT 77X108 (DRAPES) ×1 IMPLANT
DRAPE U-SHAPE 47X51 STRL (DRAPES) ×2 IMPLANT
DRESSING MEPILEX FLEX 4X4 (GAUZE/BANDAGES/DRESSINGS) IMPLANT
DRILL BIT 2.8MM (BIT) ×1
DRSG ADAPTIC 3X8 NADH LF (GAUZE/BANDAGES/DRESSINGS) ×1 IMPLANT
DRSG MEPILEX FLEX 4X4 (GAUZE/BANDAGES/DRESSINGS) ×2
DRSG MEPILEX POST OP 4X12 (GAUZE/BANDAGES/DRESSINGS) IMPLANT
DRSG MEPILEX POST OP 4X8 (GAUZE/BANDAGES/DRESSINGS) IMPLANT
DRSG MEPITEL 4X7.2 (GAUZE/BANDAGES/DRESSINGS) ×1 IMPLANT
ELECT REM PT RETURN 9FT ADLT (ELECTROSURGICAL) ×1
ELECTRODE REM PT RTRN 9FT ADLT (ELECTROSURGICAL) ×1 IMPLANT
EVACUATOR 1/8 PVC DRAIN (DRAIN) IMPLANT
GAUZE PAD ABD 8X10 STRL (GAUZE/BANDAGES/DRESSINGS) ×1 IMPLANT
GAUZE SPONGE 4X4 12PLY STRL (GAUZE/BANDAGES/DRESSINGS) ×2 IMPLANT
GLOVE BIO SURGEON STRL SZ7.5 (GLOVE) ×1 IMPLANT
GLOVE BIOGEL PI IND STRL 7.5 (GLOVE) ×1 IMPLANT
GLOVE BIOGEL PI IND STRL 8 (GLOVE) ×1 IMPLANT
GLOVE SURG ORTHO LTX SZ7.5 (GLOVE) ×2 IMPLANT
GOWN STRL REUS W/ TWL LRG LVL3 (GOWN DISPOSABLE) ×1 IMPLANT
GOWN STRL REUS W/ TWL XL LVL3 (GOWN DISPOSABLE) ×2 IMPLANT
GOWN STRL REUS W/TWL LRG LVL3 (GOWN DISPOSABLE) ×1
GOWN STRL REUS W/TWL XL LVL3 (GOWN DISPOSABLE) ×2
KIT BASIN OR (CUSTOM PROCEDURE TRAY) ×1 IMPLANT
KIT INFUSE LRG II (Orthopedic Implant) IMPLANT
KIT TURNOVER KIT B (KITS) ×1 IMPLANT
MANIFOLD NEPTUNE II (INSTRUMENTS) ×1 IMPLANT
NDL HYPO 25X1 1.5 SAFETY (NEEDLE) IMPLANT
NEEDLE HYPO 25X1 1.5 SAFETY (NEEDLE) IMPLANT
NS IRRIG 1000ML POUR BTL (IV SOLUTION) ×1 IMPLANT
PACK ORTHO EXTREMITY (CUSTOM PROCEDURE TRAY) ×1 IMPLANT
PAD ARMBOARD 7.5X6 YLW CONV (MISCELLANEOUS) ×2 IMPLANT
PADDING CAST ABS COTTON 4X4 ST (CAST SUPPLIES) IMPLANT
PLATE LOCK DIST 230 10H RT (Plate) IMPLANT
SCREW LOCK CORT ST 3.5X24 (Screw) IMPLANT
SCREW LOCK CORT ST 3.5X26 (Screw) IMPLANT
SCREW LOCK CORT ST 3.5X30 (Screw) IMPLANT
SCREW LOCK T15 FT 24X3.5X2.9X (Screw) IMPLANT
SCREW LOCK T15 FT 28X3.5X2.9X (Screw) IMPLANT
SCREW LOCKING 3.5X24 (Screw) ×2 IMPLANT
SCREW LOCKING 3.5X26 (Screw) IMPLANT
SCREW LOCKING 3.5X28 (Screw) ×3 IMPLANT
SPONGE T-LAP 18X18 ~~LOC~~+RFID (SPONGE) IMPLANT
STAPLER VISISTAT 35W (STAPLE) ×1 IMPLANT
STOCKINETTE IMPERVIOUS 9X36 MD (GAUZE/BANDAGES/DRESSINGS) IMPLANT
SUCTION FRAZIER HANDLE 10FR (MISCELLANEOUS) ×1
SUCTION TUBE FRAZIER 10FR DISP (MISCELLANEOUS) ×1 IMPLANT
SUT ETHILON 2 0 FS 18 (SUTURE) ×2 IMPLANT
SUT ETHILON 2 0 PSLX (SUTURE) IMPLANT
SUT VIC AB 0 CT1 27 (SUTURE) ×2
SUT VIC AB 0 CT1 27XBRD ANBCTR (SUTURE) ×2 IMPLANT
SUT VIC AB 2-0 CT1 27 (SUTURE) ×2
SUT VIC AB 2-0 CT1 TAPERPNT 27 (SUTURE) ×2 IMPLANT
SYR 5ML LL (SYRINGE) IMPLANT
SYR CONTROL 10ML LL (SYRINGE) IMPLANT
TOWEL GREEN STERILE (TOWEL DISPOSABLE) ×3 IMPLANT
TOWEL GREEN STERILE FF (TOWEL DISPOSABLE) ×1 IMPLANT
TRAY FOLEY MTR SLVR 16FR STAT (SET/KITS/TRAYS/PACK) IMPLANT
WATER STERILE IRR 1000ML POUR (IV SOLUTION) ×1 IMPLANT
YANKAUER SUCT BULB TIP NO VENT (SUCTIONS) IMPLANT
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
## 2022-07-27 NOTE — Transfer of Care (Signed)
Immediate Anesthesia Transfer of Care Note  Patient: Shane Klein  Procedure(s) Performed: OPEN REDUCTION INTERNAL FIXATION (ORIF) DISTAL HUMERUS FRACTURE (Right: Arm Lower)  Patient Location: ICU  Anesthesia Type:General  Level of Consciousness: Patient remains intubated per anesthesia plan  Airway & Oxygen Therapy: Patient remains intubated per anesthesia plan and Patient placed on Ventilator (see vital sign flow sheet for setting)  Post-op Assessment: Report given to RN and Post -op Vital signs reviewed and stable  Post vital signs: Reviewed and stable  Last Vitals: See ICU Vitals Vitals Value Taken Time  BP 112/77 07/27/22 1742  Temp    Pulse    Resp 16 07/27/22 1744  SpO2 100   Vitals shown include unvalidated device data.  Last Pain:  Vitals:   07/27/22 1200  TempSrc: Esophageal  PainSc:          Complications: No notable events documented.
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
## 2022-07-28 ENCOUNTER — Inpatient Hospital Stay (HOSPITAL_COMMUNITY): Payer: Medicaid Other

## 2022-07-28 ENCOUNTER — Encounter (HOSPITAL_COMMUNITY): Admission: EM | Disposition: A | Discharge status: Rehabilitation Facility | Payer: Self-pay | Source: Home / Self Care

## 2022-07-28 ENCOUNTER — Encounter (HOSPITAL_COMMUNITY): Payer: Self-pay

## 2022-07-28 DIAGNOSIS — G5631 Lesion of radial nerve, right upper limb: Secondary | ICD-10-CM | POA: Diagnosis present

## 2022-07-28 DIAGNOSIS — S42411B Displaced simple supracondylar fracture without intercondylar fracture of right humerus, initial encounter for open fracture: Secondary | ICD-10-CM | POA: Diagnosis present

## 2022-07-28 HISTORY — DX: Lesion of radial nerve, right upper limb: G56.31

## 2022-07-28 HISTORY — DX: Displaced simple supracondylar fracture without intercondylar fracture of right humerus, initial encounter for open fracture: S42.411B

## 2022-07-28 HISTORY — PX: LAPAROTOMY: SHX154

## 2022-07-28 LAB — GLUCOSE, CAPILLARY
Glucose-Capillary: 108 mg/dL — ABNORMAL HIGH (ref 70–99)
Glucose-Capillary: 115 mg/dL — ABNORMAL HIGH (ref 70–99)
Glucose-Capillary: 117 mg/dL — ABNORMAL HIGH (ref 70–99)
Glucose-Capillary: 120 mg/dL — ABNORMAL HIGH (ref 70–99)
Glucose-Capillary: 125 mg/dL — ABNORMAL HIGH (ref 70–99)
Glucose-Capillary: 131 mg/dL — ABNORMAL HIGH (ref 70–99)

## 2022-07-28 LAB — CBC
HCT: 28.2 % — ABNORMAL LOW (ref 39.0–52.0)
Hemoglobin: 9.3 g/dL — ABNORMAL LOW (ref 13.0–17.0)
MCH: 28.9 pg (ref 26.0–34.0)
MCHC: 33 g/dL (ref 30.0–36.0)
MCV: 87.6 fL (ref 80.0–100.0)
Platelets: 375 10*3/uL (ref 150–400)
RBC: 3.22 MIL/uL — ABNORMAL LOW (ref 4.22–5.81)
RDW: 19.7 % — ABNORMAL HIGH (ref 11.5–15.5)
WBC: 9 10*3/uL (ref 4.0–10.5)
nRBC: 0 % (ref 0.0–0.2)

## 2022-07-28 LAB — TYPE AND SCREEN
ABO/RH(D): A POS
Antibody Screen: NEGATIVE
Unit division: 0
Unit division: 0

## 2022-07-28 LAB — BPAM RBC
Blood Product Expiration Date: 202404212359
Blood Product Expiration Date: 202404222359
ISSUE DATE / TIME: 202404021008
ISSUE DATE / TIME: 202404021008
Unit Type and Rh: 6200
Unit Type and Rh: 6200

## 2022-07-28 LAB — BASIC METABOLIC PANEL
Anion gap: 8 (ref 5–15)
BUN: 30 mg/dL — ABNORMAL HIGH (ref 6–20)
CO2: 25 mmol/L (ref 22–32)
Calcium: 8.3 mg/dL — ABNORMAL LOW (ref 8.9–10.3)
Chloride: 114 mmol/L — ABNORMAL HIGH (ref 98–111)
Creatinine, Ser: 0.93 mg/dL (ref 0.61–1.24)
GFR, Estimated: >60 mL/min (ref >60)
Glucose, Bld: 140 mg/dL — ABNORMAL HIGH (ref 70–99)
Potassium: 3.8 mmol/L (ref 3.5–5.1)
Sodium: 147 mmol/L — ABNORMAL HIGH (ref 135–145)

## 2022-07-28 LAB — SURGICAL PATHOLOGY

## 2022-07-28 SURGERY — LAPAROTOMY, EXPLORATORY
Anesthesia: General | Site: Abdomen

## 2022-07-28 MED ORDER — HYDROCORTISONE SOD SUC (PF) 100 MG IJ SOLR
100.0000 mg | Freq: Two times a day (BID) | INTRAMUSCULAR | Status: DC
  Administered 2022-07-28 – 2022-07-30 (×3): 100 mg via INTRAVENOUS
  Filled 2022-07-28 (×3): qty 2

## 2022-07-28 MED ORDER — VITAL 1.5 CAL PO LIQD
1000.0000 mL | ORAL | Status: DC
  Administered 2022-07-28: 1000 mL

## 2022-07-28 MED ORDER — QUETIAPINE FUMARATE 25 MG PO TABS
25.0000 mg | ORAL_TABLET | Freq: Two times a day (BID) | ORAL | Status: DC
  Administered 2022-07-28 – 2022-08-02 (×9): 25 mg
  Filled 2022-07-28 (×10): qty 1

## 2022-07-28 MED ORDER — PROSOURCE TF20 ENFIT COMPATIBL EN LIQD
60.0000 mL | Freq: Every day | ENTERAL | Status: DC
  Administered 2022-07-28 – 2022-07-30 (×3): 60 mL
  Filled 2022-07-28 (×3): qty 60

## 2022-07-28 MED ORDER — FREE WATER
200.0000 mL | Freq: Four times a day (QID) | Status: DC
  Administered 2022-07-28 – 2022-07-30 (×8): 200 mL

## 2022-07-28 MED ORDER — MORPHINE SULFATE (PF) 2 MG/ML IV SOLN
2.0000 mg | INTRAVENOUS | Status: DC | PRN
  Administered 2022-07-28 – 2022-07-29 (×12): 4 mg via INTRAVENOUS
  Administered 2022-07-29: 2 mg via INTRAVENOUS
  Filled 2022-07-28 (×11): qty 2
  Filled 2022-07-28: qty 1
  Filled 2022-07-28: qty 2

## 2022-07-28 MED ORDER — DEXMEDETOMIDINE HCL IN NACL 400 MCG/100ML IV SOLN
0.0000 ug/kg/h | INTRAVENOUS | Status: DC
  Administered 2022-07-28 (×2): 0.4 ug/kg/h via INTRAVENOUS
  Filled 2022-07-28 (×2): qty 100

## 2022-07-28 SURGICAL SUPPLY — 49 items
APL PRP STRL LF DISP 70% ISPRP (MISCELLANEOUS)
BLADE CLIPPER SURG (BLADE) IMPLANT
CANISTER SUCT 3000ML PPV (MISCELLANEOUS) ×1 IMPLANT
CATH ROBINSON RED A/P 12FR (CATHETERS) IMPLANT
CHLORAPREP W/TINT 26 (MISCELLANEOUS) ×1 IMPLANT
COVER SURGICAL LIGHT HANDLE (MISCELLANEOUS) ×1 IMPLANT
DRAIN CHANNEL 19F RND (DRAIN) IMPLANT
DRAPE LAPAROSCOPIC ABDOMINAL (DRAPES) ×1 IMPLANT
DRAPE UNIVERSAL (DRAPES) ×1 IMPLANT
DRAPE WARM FLUID 44X44 (DRAPES) ×1 IMPLANT
DRSG OPSITE POSTOP 4X10 (GAUZE/BANDAGES/DRESSINGS) IMPLANT
DRSG OPSITE POSTOP 4X12 (GAUZE/BANDAGES/DRESSINGS) IMPLANT
DRSG OPSITE POSTOP 4X8 (GAUZE/BANDAGES/DRESSINGS) IMPLANT
ELECT BLADE 6.5 EXT (BLADE) IMPLANT
ELECT CAUTERY BLADE 6.4 (BLADE) ×1 IMPLANT
ELECT REM PT RETURN 9FT ADLT (ELECTROSURGICAL) ×1
ELECTRODE REM PT RTRN 9FT ADLT (ELECTROSURGICAL) ×1 IMPLANT
EVACUATOR SILICONE 100CC (DRAIN) IMPLANT
GLOVE BIO SURGEON STRL SZ 6.5 (GLOVE) ×1 IMPLANT
GLOVE BIOGEL PI IND STRL 6 (GLOVE) ×1 IMPLANT
GOWN STRL REUS W/ TWL LRG LVL3 (GOWN DISPOSABLE) ×2 IMPLANT
GOWN STRL REUS W/TWL LRG LVL3 (GOWN DISPOSABLE) ×2
HANDLE SUCTION POOLE (INSTRUMENTS) ×1 IMPLANT
KIT BASIN OR (CUSTOM PROCEDURE TRAY) ×1 IMPLANT
KIT TURNOVER KIT B (KITS) ×1 IMPLANT
LIGASURE IMPACT 36 18CM CVD LR (INSTRUMENTS) IMPLANT
NS IRRIG 1000ML POUR BTL (IV SOLUTION) ×2 IMPLANT
PACK GENERAL/GYN (CUSTOM PROCEDURE TRAY) ×1 IMPLANT
PACK UNIVERSAL I (CUSTOM PROCEDURE TRAY) IMPLANT
PAD ARMBOARD 7.5X6 YLW CONV (MISCELLANEOUS) ×1 IMPLANT
PENCIL SMOKE EVACUATOR (MISCELLANEOUS) ×1 IMPLANT
SPONGE T-LAP 18X18 ~~LOC~~+RFID (SPONGE) IMPLANT
STAPLER VISISTAT 35W (STAPLE) ×1 IMPLANT
SUCTION POOLE HANDLE (INSTRUMENTS) ×1
SUT ETHILON 1 TP 1 60 (SUTURE) IMPLANT
SUT ETHILON 2 0 FS 18 (SUTURE) IMPLANT
SUT PDS AB 1 CT  36 (SUTURE) ×1
SUT PDS AB 1 CT 36 (SUTURE) IMPLANT
SUT PDS AB 1 CT1 36 (SUTURE) IMPLANT
SUT PDS AB 1 TP1 54 (SUTURE) IMPLANT
SUT PDS AB 1 TP1 96 (SUTURE) IMPLANT
SUT SILK 2 0 SH CR/8 (SUTURE) ×1 IMPLANT
SUT SILK 2 0 TIES 10X30 (SUTURE) ×1 IMPLANT
SUT SILK 3 0 SH CR/8 (SUTURE) ×1 IMPLANT
SUT SILK 3 0 TIES 10X30 (SUTURE) ×1 IMPLANT
SUT VIC AB 3-0 SH 18 (SUTURE) IMPLANT
TOWEL GREEN STERILE (TOWEL DISPOSABLE) ×1 IMPLANT
TRAY FOLEY MTR SLVR 16FR STAT (SET/KITS/TRAYS/PACK) IMPLANT
YANKAUER SUCT BULB TIP NO VENT (SUCTIONS) IMPLANT

## 2022-07-28 NOTE — Progress Notes (Signed)
Estill Bamberg RN aware of order to remove CVC.  Also states has 2 PIV's as requested
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
## 2022-07-28 NOTE — Progress Notes (Signed)
Assisted tele visit to patient with family member.  Shane Klein R Chipper Koudelka, RN   

## 2022-07-28 NOTE — Procedures (Signed)
Cortrak  Person Inserting Tube:  Charlean Carneal T, RD Tube Type:  Cortrak - 43 inches Tube Size:  10 Tube Location:  Left nare Secured by: Bridle Technique Used to Measure Tube Placement:  Marking at nare/corner of mouth Cortrak Secured At:  76 cm   Cortrak Tube Team Note:  Consult received to place a Cortrak feeding tube.   X-ray is required, abdominal x-ray has been ordered by the Cortrak team. Please confirm tube placement before using the Cortrak tube.   If the tube becomes dislodged please keep the tube and contact the Cortrak team at [email or web address] for replacement.  If after hours and replacement cannot be delayed, place a NG tube and confirm placement with an abdominal x-ray.    Samson Frederic RD, LDN For contact information, refer to [facility].

## 2022-07-28 NOTE — Procedures (Signed)
Extubation Procedure Note  Patient Details:   Name: Shane Klein DOB: 14-Apr-1991 MRN: [id number]   Airway Documentation:    Vent end date: 07/28/22 Vent end time: 0926   Evaluation  O2 sats: stable throughout Complications: No apparent complications Patient did tolerate procedure well. Bilateral Breath Sounds: Clear   Yes  Patient extubated per MD order. Positive cuff leak. No stridor noted. Vitals are stable on 3L [place]. RN at bedside.  Herbie Saxon Adalaide Jaskolski 07/28/2022, 9:26 AM
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
## 2022-07-28 NOTE — Anesthesia Postprocedure Evaluation (Signed)
Anesthesia Post Note  Patient: Chanceler K Tennell  Procedure(s) Performed: OPEN REDUCTION INTERNAL FIXATION (ORIF) DISTAL HUMERUS FRACTURE (Right: Arm Lower)     Patient location during evaluation: SICU Anesthesia Type: General Level of consciousness: sedated Pain management: pain level controlled Vital Signs Assessment: post-procedure vital signs reviewed and stable Respiratory status: patient remains intubated per anesthesia plan Cardiovascular status: stable Postop Assessment: no apparent nausea or vomiting Anesthetic complications: no   No notable events documented.  Last Vitals:  Vitals:   07/28/22 0747 07/28/22 0800  BP:  123/77  Pulse: 76 97  Resp: (!) 21 (!) 26  Temp:    SpO2: 99% 99%    Last Pain:  Vitals:   07/28/22 0400  TempSrc: Axillary  PainSc:                  March Rummage Mansfield Dann
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
## 2022-07-29 ENCOUNTER — Inpatient Hospital Stay (HOSPITAL_COMMUNITY): Payer: Medicaid Other

## 2022-07-29 ENCOUNTER — Inpatient Hospital Stay (HOSPITAL_COMMUNITY): Payer: Medicaid Other | Admitting: Anesthesiology

## 2022-07-29 ENCOUNTER — Other Ambulatory Visit: Payer: Self-pay

## 2022-07-29 ENCOUNTER — Encounter (HOSPITAL_COMMUNITY): Payer: Self-pay

## 2022-07-29 DIAGNOSIS — T8130XA Disruption of wound, unspecified, initial encounter: Secondary | ICD-10-CM

## 2022-07-29 DIAGNOSIS — R1900 Intra-abdominal and pelvic swelling, mass and lump, unspecified site: Secondary | ICD-10-CM

## 2022-07-29 DIAGNOSIS — D649 Anemia, unspecified: Secondary | ICD-10-CM

## 2022-07-29 DIAGNOSIS — G709 Myoneural disorder, unspecified: Secondary | ICD-10-CM

## 2022-07-29 LAB — CBC
HCT: 33.2 % — ABNORMAL LOW (ref 39.0–52.0)
Hemoglobin: 10.5 g/dL — ABNORMAL LOW (ref 13.0–17.0)
MCH: 27.9 pg (ref 26.0–34.0)
MCHC: 31.6 g/dL (ref 30.0–36.0)
MCV: 88.3 fL (ref 80.0–100.0)
Platelets: 490 10*3/uL — ABNORMAL HIGH (ref 150–400)
RBC: 3.76 MIL/uL — ABNORMAL LOW (ref 4.22–5.81)
RDW: 19.9 % — ABNORMAL HIGH (ref 11.5–15.5)
WBC: 14 10*3/uL — ABNORMAL HIGH (ref 4.0–10.5)
nRBC: 0 % (ref 0.0–0.2)

## 2022-07-29 LAB — BASIC METABOLIC PANEL
Anion gap: 8 (ref 5–15)
BUN: 25 mg/dL — ABNORMAL HIGH (ref 6–20)
CO2: 22 mmol/L (ref 22–32)
Calcium: 8 mg/dL — ABNORMAL LOW (ref 8.9–10.3)
Chloride: 111 mmol/L (ref 98–111)
Creatinine, Ser: 0.8 mg/dL (ref 0.61–1.24)
GFR, Estimated: >60 mL/min (ref >60)
Glucose, Bld: 98 mg/dL (ref 70–99)
Potassium: 3.1 mmol/L — ABNORMAL LOW (ref 3.5–5.1)
Sodium: 141 mmol/L (ref 135–145)

## 2022-07-29 LAB — VITAMIN D 25 HYDROXY (VIT D DEFICIENCY, FRACTURES): Vit D, 25-Hydroxy: 11.87 ng/mL — ABNORMAL LOW (ref 30–100)

## 2022-07-29 LAB — GLUCOSE, CAPILLARY
Glucose-Capillary: 91 mg/dL (ref 70–99)
Glucose-Capillary: 108 mg/dL — ABNORMAL HIGH (ref 70–99)
Glucose-Capillary: 90 mg/dL (ref 70–99)
Glucose-Capillary: 89 mg/dL (ref 70–99)
Glucose-Capillary: 108 mg/dL — ABNORMAL HIGH (ref 70–99)
Glucose-Capillary: 133 mg/dL — ABNORMAL HIGH (ref 70–99)
Glucose-Capillary: 105 mg/dL — ABNORMAL HIGH (ref 70–99)

## 2022-07-29 MED ORDER — POTASSIUM CHLORIDE 20 MEQ PO PACK
40.0000 meq | PACK | Freq: Once | ORAL | Status: AC
  Administered 2022-07-29: 40 meq
  Filled 2022-07-29: qty 2

## 2022-07-29 MED ORDER — SUGAMMADEX SODIUM 200 MG/2ML IV SOLN
INTRAVENOUS | Status: DC | PRN
  Administered 2022-07-29: 200 mg via INTRAVENOUS

## 2022-07-29 MED ORDER — CEFAZOLIN SODIUM-DEXTROSE 2-3 GM-%(50ML) IV SOLR
INTRAVENOUS | Status: DC | PRN
  Administered 2022-07-29: 2 g via INTRAVENOUS

## 2022-07-29 MED ORDER — ROCURONIUM BROMIDE 100 MG/10ML IV SOLN
INTRAVENOUS | Status: DC | PRN
  Administered 2022-07-29: 10 mg via INTRAVENOUS
  Administered 2022-07-29 (×2): 5 mg via INTRAVENOUS
  Administered 2022-07-29: 30 mg via INTRAVENOUS

## 2022-07-29 MED ORDER — ORAL CARE MOUTH RINSE: 15.0000 mL | OROMUCOSAL | Status: DC | PRN

## 2022-07-29 MED ORDER — AMISULPRIDE (ANTIEMETIC) 5 MG/2ML IV SOLN: 10.0000 mg | Freq: Once | INTRAVENOUS | Status: DC | PRN

## 2022-07-29 MED ORDER — PROMETHAZINE HCL 25 MG/ML IJ SOLN: 6.2500 mg | INTRAMUSCULAR | Status: DC | PRN

## 2022-07-29 MED ORDER — FENTANYL CITRATE (PF) 250 MCG/5ML IJ SOLN
INTRAMUSCULAR | Status: DC | PRN
  Administered 2022-07-29: 25 ug via INTRAVENOUS
  Administered 2022-07-29: 50 ug via INTRAVENOUS
  Administered 2022-07-29: 25 ug via INTRAVENOUS
  Administered 2022-07-29: 150 ug via INTRAVENOUS
  Administered 2022-07-29 (×2): 50 ug via INTRAVENOUS

## 2022-07-29 MED ORDER — KETOROLAC TROMETHAMINE 15 MG/ML IJ SOLN
INTRAMUSCULAR | Status: AC
  Filled 2022-07-29: qty 1

## 2022-07-29 MED ORDER — KETOROLAC TROMETHAMINE 30 MG/ML IJ SOLN
30.0000 mg | Freq: Once | INTRAMUSCULAR | Status: AC | PRN
  Administered 2022-07-29: 30 mg via INTRAVENOUS

## 2022-07-29 MED ORDER — FENTANYL CITRATE (PF) 250 MCG/5ML IJ SOLN
INTRAMUSCULAR | Status: AC
  Filled 2022-07-29: qty 5

## 2022-07-29 MED ORDER — SUCCINYLCHOLINE CHLORIDE 20 MG/ML IJ SOLN
INTRAMUSCULAR | Status: DC | PRN
  Administered 2022-07-29: 80 mg via INTRAVENOUS

## 2022-07-29 MED ORDER — GUAIFENESIN 100 MG/5ML PO LIQD
5.0000 mL | Freq: Once | ORAL | Status: AC
  Administered 2022-07-29: 5 mL via ORAL
  Filled 2022-07-29: qty 15

## 2022-07-29 MED ORDER — PROPOFOL 10 MG/ML IV BOLUS
INTRAVENOUS | Status: DC | PRN
  Administered 2022-07-29: 100 mg via INTRAVENOUS

## 2022-07-29 MED ORDER — FENTANYL CITRATE (PF) 100 MCG/2ML IJ SOLN
INTRAMUSCULAR | Status: AC
  Filled 2022-07-29: qty 2

## 2022-07-29 MED ORDER — MIDAZOLAM HCL 2 MG/2ML IJ SOLN
INTRAMUSCULAR | Status: AC
  Filled 2022-07-29: qty 2

## 2022-07-29 MED ORDER — OXYCODONE HCL 5 MG/5ML PO SOLN: 5.0000 mg | Freq: Once | ORAL | Status: DC | PRN

## 2022-07-29 MED ORDER — LIDOCAINE HCL (CARDIAC) PF 100 MG/5ML IV SOSY
PREFILLED_SYRINGE | INTRAVENOUS | Status: DC | PRN
  Administered 2022-07-29: 60 mg via INTRATRACHEAL

## 2022-07-29 MED ORDER — LACTATED RINGERS IV SOLN: INTRAVENOUS | Status: DC | PRN

## 2022-07-29 MED ORDER — DEXAMETHASONE SODIUM PHOSPHATE 4 MG/ML IJ SOLN
INTRAMUSCULAR | Status: DC | PRN
  Administered 2022-07-29: 5 mg via INTRAVENOUS

## 2022-07-29 MED ORDER — 0.9 % SODIUM CHLORIDE (POUR BTL) OPTIME
TOPICAL | Status: DC | PRN
  Administered 2022-07-29: 1000 mL

## 2022-07-29 MED ORDER — FENTANYL CITRATE (PF) 100 MCG/2ML IJ SOLN
25.0000 ug | INTRAMUSCULAR | Status: DC | PRN
  Administered 2022-07-29 (×3): 50 ug via INTRAVENOUS

## 2022-07-29 MED ORDER — PROPOFOL 10 MG/ML IV BOLUS
INTRAVENOUS | Status: AC
  Filled 2022-07-29: qty 20

## 2022-07-29 MED ORDER — ACETAMINOPHEN 10 MG/ML IV SOLN: 1000.0000 mg | Freq: Once | INTRAVENOUS | Status: DC | PRN

## 2022-07-29 MED ORDER — ONDANSETRON HCL 4 MG/2ML IJ SOLN
INTRAMUSCULAR | Status: DC | PRN
  Administered 2022-07-29: 4 mg via INTRAVENOUS

## 2022-07-29 MED ORDER — OXYCODONE HCL 5 MG PO TABS: 5.0000 mg | ORAL_TABLET | Freq: Once | ORAL | Status: DC | PRN

## 2022-07-29 MED ORDER — ORAL CARE MOUTH RINSE
15.0000 mL | OROMUCOSAL | Status: DC
  Administered 2022-07-29 – 2022-07-30 (×5): 15 mL via OROMUCOSAL

## 2022-07-29 NOTE — Progress Notes (Signed)
OT Cancellation Note  Patient Details Name: Shane Klein MRN: [id number] DOB: Sep 11, 1990   Cancelled Treatment:    Reason Eval/Treat Not Completed: Other (comment) Nsg waiting on ostomy supplies to change and clean up pt before mobilizing. Will return.  [name],HILLARY 07/29/2022, 11:50 AM Maurie Boettcher, OT/L   Acute OT Clinical Specialist Acute Rehabilitation Services Pager [phone] Office [phone]
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
## 2022-07-29 NOTE — Progress Notes (Signed)
Notified by nursing staff of near complete wound dehiscence. Will take to OR for washout/exploration and attempt at fascial closure with retention sutures. Consent obtained from mother.   Jesusita Oka, MD General and [facility]
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
## 2022-07-29 NOTE — Progress Notes (Signed)
   Inpatient Rehab Admissions Coordinator :  Per therapy recommendations patient was screened for CIR candidacy by Danne Baxter RN MSN. Patient is not yet at a level to tolerate the intensity required to pursue CIR. Patient may have the potential to progress to become a candidate. The CIR admissions team will follow and monitor for progress and place a Rehab Consult order if felt to be appropriate. Please contact me with any questions.  Danne Baxter RN MSN Admissions Coordinator [phone]

## 2022-07-29 NOTE — Anesthesia Postprocedure Evaluation (Signed)
Anesthesia Post Note  Patient: Shane Klein  Procedure(s) Performed: ABDOMINAL WASHOUT, PLACEMENT OF RETENTION SUTURES (Abdomen)     Patient location during evaluation: PACU Anesthesia Type: General Level of consciousness: awake Pain management: pain level controlled Vital Signs Assessment: post-procedure vital signs reviewed and stable Respiratory status: spontaneous breathing, nonlabored ventilation and respiratory function stable Cardiovascular status: blood pressure returned to baseline and stable Postop Assessment: no apparent nausea or vomiting Anesthetic complications: no   No notable events documented.  Last Vitals:  Vitals:   07/29/22 0508 07/29/22 0600  BP:  (!) 150/76  Pulse: 83 90  Resp: (!) 24 (!) 26  Temp:    SpO2: 97% 97%    Last Pain:  Vitals:   07/29/22 0600  TempSrc:   PainSc: 4                  Samyak Sackmann P Ande Therrell

## 2022-07-29 NOTE — Progress Notes (Signed)
Assisted tele visit to patient with family member.  Lachrisha Ziebarth R Jourden Delmont, RN   

## 2022-07-29 NOTE — Progress Notes (Signed)
2340 - This RN entered room to perform dressing changes as well as provide the patient with a gown/linen change. Upon removing wet to dry abdominal dressings severe wound dehiscence was noted. There also appeared to be two popped sutures in the abdominal wound, with the remaining others intact.   TRN called to the bedside, and Trauma MD Lovick paged.  Abdominal wound re-dressed and patient education provided regarding splinting the abdomen with a pillow when needing to cough.    Decision made by MD Lovick to take patient to the OR. Consent obtained from patient's mother and placed in chart. Pre-op checklist as well as pre-op CHG bath completed. Provided re-assurance to patient.   Care ongoing.   Wyn Quaker, RN

## 2022-07-29 NOTE — Transfer of Care (Signed)
Immediate Anesthesia Transfer of Care Note  Patient: Shane Klein  Procedure(s) Performed: ABDOMINAL WASHOUT, PLACEMENT OF RETENTION SUTURES (Abdomen)  Patient Location: PACU  Anesthesia Type:General  Level of Consciousness: awake, alert , and oriented  Airway & Oxygen Therapy: Patient Spontanous Breathing and Patient connected to nasal cannula oxygen  Post-op Assessment: Report given to RN and Post -op Vital signs reviewed and stable  Post vital signs: Reviewed and stable  Last Vitals:  Vitals Value Taken Time  BP 120/61 07/29/22 0233  Temp    Pulse 79 07/29/22 0240  Resp 38 07/29/22 0240  SpO2 97 % 07/29/22 0240  Vitals shown include unvalidated device data.  Last Pain:  Vitals:   07/29/22 0000  TempSrc: Oral  PainSc: 3          Complications: No notable events documented.
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
## 2022-07-29 NOTE — Progress Notes (Signed)
Orthopedic Tech Progress Note Patient Details:  Shane Klein [birth year] [id number]  Ortho Devices Type of Ortho Device: Prafo boot/shoe Ortho Device/Splint Location: BLE Ortho Device/Splint Interventions: Ordered, Application, Adjustment   Post Interventions Patient Tolerated: Well Instructions Provided: Care of device  Janit Pagan 07/29/2022, 5:25 PM
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
## 2022-07-29 NOTE — Anesthesia Procedure Notes (Signed)
Procedure Name: Intubation Date/Time: 07/29/2022 12:50 AM  Performed by: Suzy Bouchard, CRNAPre-anesthesia Checklist: Patient identified, Emergency Drugs available, Suction available, Patient being monitored and Timeout performed Patient Re-evaluated:Patient Re-evaluated prior to induction Oxygen Delivery Method: Circle system utilized Preoxygenation: Pre-oxygenation with 100% oxygen Induction Type: Rapid sequence and IV induction Laryngoscope Size: Miller and 2 Grade View: Grade I Tube type: Oral Tube size: 7.5 mm Number of attempts: 1 Airway Equipment and Method: Stylet Placement Confirmation: ETT inserted through vocal cords under direct vision, breath sounds checked- equal and bilateral and positive ETCO2 Secured at: 23 cm Tube secured with: Tape Dental Injury: Teeth and Oropharynx as per pre-operative assessment

## 2022-07-30 LAB — BASIC METABOLIC PANEL
Anion gap: 13 (ref 5–15)
BUN: 20 mg/dL (ref 6–20)
CO2: 21 mmol/L — ABNORMAL LOW (ref 22–32)
Calcium: 8.1 mg/dL — ABNORMAL LOW (ref 8.9–10.3)
Chloride: 107 mmol/L (ref 98–111)
Creatinine, Ser: 0.77 mg/dL (ref 0.61–1.24)
GFR, Estimated: >60 mL/min (ref >60)
Glucose, Bld: 123 mg/dL — ABNORMAL HIGH (ref 70–99)
Potassium: 3.8 mmol/L (ref 3.5–5.1)
Sodium: 141 mmol/L (ref 135–145)

## 2022-07-30 LAB — GLUCOSE, CAPILLARY
Glucose-Capillary: 140 mg/dL — ABNORMAL HIGH (ref 70–99)
Glucose-Capillary: 113 mg/dL — ABNORMAL HIGH (ref 70–99)
Glucose-Capillary: 94 mg/dL (ref 70–99)
Glucose-Capillary: 104 mg/dL — ABNORMAL HIGH (ref 70–99)
Glucose-Capillary: 109 mg/dL — ABNORMAL HIGH (ref 70–99)
Glucose-Capillary: 122 mg/dL — ABNORMAL HIGH (ref 70–99)

## 2022-07-30 LAB — CBC
HCT: 30.3 % — ABNORMAL LOW (ref 39.0–52.0)
Hemoglobin: 9.5 g/dL — ABNORMAL LOW (ref 13.0–17.0)
MCH: 28.2 pg (ref 26.0–34.0)
MCHC: 31.4 g/dL (ref 30.0–36.0)
MCV: 89.9 fL (ref 80.0–100.0)
Platelets: 603 10*3/uL — ABNORMAL HIGH (ref 150–400)
RBC: 3.37 MIL/uL — ABNORMAL LOW (ref 4.22–5.81)
RDW: 20 % — ABNORMAL HIGH (ref 11.5–15.5)
WBC: 16.4 10*3/uL — ABNORMAL HIGH (ref 4.0–10.5)
nRBC: 0.1 % (ref 0.0–0.2)

## 2022-07-30 MED ORDER — VITAMIN D 25 MCG (1000 UNIT) PO TABS
2000.0000 [IU] | ORAL_TABLET | Freq: Two times a day (BID) | ORAL | Status: DC
  Administered 2022-07-30 – 2022-08-02 (×5): 2000 [IU] via ORAL
  Filled 2022-07-30 (×5): qty 2

## 2022-07-30 MED ORDER — ORAL CARE MOUTH RINSE
15.0000 mL | OROMUCOSAL | Status: DC
  Administered 2022-07-30 – 2022-08-22 (×83): 15 mL via OROMUCOSAL

## 2022-07-30 MED ORDER — TRAMADOL HCL 50 MG PO TABS
50.0000 mg | ORAL_TABLET | Freq: Four times a day (QID) | ORAL | Status: DC
  Administered 2022-07-30 – 2022-08-02 (×11): 50 mg via ORAL
  Filled 2022-07-30 (×11): qty 1

## 2022-07-30 MED ORDER — ORAL CARE MOUTH RINSE: 15.0000 mL | OROMUCOSAL | Status: DC | PRN

## 2022-07-30 MED ORDER — LOPERAMIDE HCL 2 MG PO CAPS
2.0000 mg | ORAL_CAPSULE | Freq: Two times a day (BID) | ORAL | Status: DC
  Administered 2022-07-30 (×2): 2 mg via ORAL
  Filled 2022-07-30 (×4): qty 1

## 2022-07-30 MED ORDER — HYDROMORPHONE HCL 1 MG/ML IJ SOLN
1.0000 mg | INTRAMUSCULAR | Status: DC | PRN
  Administered 2022-07-30 – 2022-08-01 (×18): 2 mg via INTRAVENOUS
  Administered 2022-08-01: 1 mg via INTRAVENOUS
  Filled 2022-07-30 (×12): qty 2
  Filled 2022-07-30: qty 1
  Filled 2022-07-30 (×6): qty 2

## 2022-07-30 MED ORDER — VITAL 1.5 CAL PO LIQD
1000.0000 mL | ORAL | Status: DC
  Administered 2022-07-30 – 2022-07-31 (×2): 1000 mL

## 2022-07-30 MED ORDER — PIPERACILLIN-TAZOBACTAM 3.375 G IVPB
3.3750 g | Freq: Three times a day (TID) | INTRAVENOUS | Status: AC
  Administered 2022-07-30 – 2022-08-03 (×15): 3.375 g via INTRAVENOUS
  Filled 2022-07-30 (×15): qty 50

## 2022-07-30 MED ORDER — HYDROCORTISONE SOD SUC (PF) 100 MG IJ SOLR
100.0000 mg | Freq: Every day | INTRAMUSCULAR | Status: DC
  Administered 2022-07-30 – 2022-08-02 (×4): 100 mg via INTRAVENOUS
  Filled 2022-07-30 (×4): qty 2

## 2022-07-30 MED ORDER — FREE WATER
200.0000 mL | Status: DC
  Administered 2022-07-30 – 2022-08-01 (×10): 200 mL

## 2022-07-30 MED ORDER — VITAMIN C 500 MG PO TABS
1000.0000 mg | ORAL_TABLET | Freq: Every day | ORAL | Status: DC
  Administered 2022-07-30 – 2022-08-02 (×3): 1000 mg via ORAL
  Filled 2022-07-30 (×3): qty 2

## 2022-07-30 MED ORDER — PROSOURCE TF20 ENFIT COMPATIBL EN LIQD
60.0000 mL | Freq: Two times a day (BID) | ENTERAL | Status: DC
  Administered 2022-07-30 – 2022-08-02 (×4): 60 mL
  Filled 2022-07-30 (×5): qty 60
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
## 2022-07-30 NOTE — Progress Notes (Signed)
Inpatient Rehabilitation Admissions Coordinator   I will place rehab consult for full assessment of candidacy.  Ottie Glazier, RN, MSN Rehab Admissions Coordinator [phone] 07/30/2022 5:27 PM
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
## 2022-07-31 ENCOUNTER — Inpatient Hospital Stay (HOSPITAL_COMMUNITY): Payer: Medicaid Other

## 2022-07-31 LAB — CBC
HCT: 28.8 % — ABNORMAL LOW (ref 39.0–52.0)
Hemoglobin: 8.9 g/dL — ABNORMAL LOW (ref 13.0–17.0)
MCH: 27.7 pg (ref 26.0–34.0)
MCHC: 30.9 g/dL (ref 30.0–36.0)
MCV: 89.7 fL (ref 80.0–100.0)
Platelets: 710 K/uL — ABNORMAL HIGH (ref 150–400)
RBC: 3.21 MIL/uL — ABNORMAL LOW (ref 4.22–5.81)
RDW: 20.2 % — ABNORMAL HIGH (ref 11.5–15.5)
WBC: 14.7 K/uL — ABNORMAL HIGH (ref 4.0–10.5)
nRBC: 0.1 % (ref 0.0–0.2)

## 2022-07-31 LAB — GLUCOSE, CAPILLARY
Glucose-Capillary: 126 mg/dL — ABNORMAL HIGH (ref 70–99)
Glucose-Capillary: 68 mg/dL — ABNORMAL LOW (ref 70–99)
Glucose-Capillary: 98 mg/dL (ref 70–99)
Glucose-Capillary: 103 mg/dL — ABNORMAL HIGH (ref 70–99)
Glucose-Capillary: 80 mg/dL (ref 70–99)
Glucose-Capillary: 86 mg/dL (ref 70–99)
Glucose-Capillary: 124 mg/dL — ABNORMAL HIGH (ref 70–99)

## 2022-07-31 LAB — BASIC METABOLIC PANEL
Anion gap: 12 (ref 5–15)
BUN: 26 mg/dL — ABNORMAL HIGH (ref 6–20)
CO2: 21 mmol/L — ABNORMAL LOW (ref 22–32)
Calcium: 8 mg/dL — ABNORMAL LOW (ref 8.9–10.3)
Chloride: 107 mmol/L (ref 98–111)
Creatinine, Ser: 0.7 mg/dL (ref 0.61–1.24)
GFR, Estimated: >60 mL/min (ref >60)
Glucose, Bld: 106 mg/dL — ABNORMAL HIGH (ref 70–99)
Potassium: 3.5 mmol/L (ref 3.5–5.1)
Sodium: 140 mmol/L (ref 135–145)

## 2022-07-31 MED ORDER — LOPERAMIDE HCL 2 MG PO CAPS
4.0000 mg | ORAL_CAPSULE | Freq: Two times a day (BID) | ORAL | Status: DC
  Administered 2022-07-31 (×2): 4 mg via ORAL
  Filled 2022-07-31 (×8): qty 2
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
## 2022-08-01 ENCOUNTER — Inpatient Hospital Stay (HOSPITAL_COMMUNITY): Payer: Medicaid Other

## 2022-08-01 LAB — BASIC METABOLIC PANEL
Anion gap: 13 (ref 5–15)
Anion gap: 13 (ref 5–15)
BUN: 25 mg/dL — ABNORMAL HIGH (ref 6–20)
BUN: 30 mg/dL — ABNORMAL HIGH (ref 6–20)
CO2: 20 mmol/L — ABNORMAL LOW (ref 22–32)
CO2: 21 mmol/L — ABNORMAL LOW (ref 22–32)
Calcium: 8.1 mg/dL — ABNORMAL LOW (ref 8.9–10.3)
Calcium: 7.7 mg/dL — ABNORMAL LOW (ref 8.9–10.3)
Chloride: 104 mmol/L (ref 98–111)
Chloride: 101 mmol/L (ref 98–111)
Creatinine, Ser: 0.91 mg/dL (ref 0.61–1.24)
Creatinine, Ser: 1.1 mg/dL (ref 0.61–1.24)
GFR, Estimated: >60 mL/min (ref >60)
GFR, Estimated: >60 mL/min (ref >60)
Glucose, Bld: 136 mg/dL — ABNORMAL HIGH (ref 70–99)
Glucose, Bld: 89 mg/dL (ref 70–99)
Potassium: 4 mmol/L (ref 3.5–5.1)
Potassium: 4.2 mmol/L (ref 3.5–5.1)
Sodium: 137 mmol/L (ref 135–145)
Sodium: 135 mmol/L (ref 135–145)

## 2022-08-01 LAB — VITAMIN B12: Vitamin B-12: 128 pg/mL — ABNORMAL LOW (ref 180–914)

## 2022-08-01 LAB — CBC WITH DIFFERENTIAL/PLATELET
Abs Immature Granulocytes: 0 10*3/uL (ref 0.00–0.07)
Basophils Absolute: 0 10*3/uL (ref 0.0–0.1)
Basophils Relative: 0 %
Eosinophils Absolute: 0 10*3/uL (ref 0.0–0.5)
Eosinophils Relative: 0 %
HCT: 26.4 % — ABNORMAL LOW (ref 39.0–52.0)
Hemoglobin: 9.5 g/dL — ABNORMAL LOW (ref 13.0–17.0)
Lymphocytes Relative: 6 %
Lymphs Abs: 1.3 10*3/uL (ref 0.7–4.0)
MCH: 28.7 pg (ref 26.0–34.0)
MCHC: 36 g/dL (ref 30.0–36.0)
MCV: 79.8 fL — ABNORMAL LOW (ref 80.0–100.0)
Monocytes Absolute: 0.2 10*3/uL (ref 0.1–1.0)
Monocytes Relative: 1 %
Neutro Abs: 19.5 10*3/uL — ABNORMAL HIGH (ref 1.7–7.7)
Neutrophils Relative %: 93 %
Platelets: 544 10*3/uL — ABNORMAL HIGH (ref 150–400)
RBC: 3.31 MIL/uL — ABNORMAL LOW (ref 4.22–5.81)
RDW: 18.4 % — ABNORMAL HIGH (ref 11.5–15.5)
WBC: 21 10*3/uL — ABNORMAL HIGH (ref 4.0–10.5)
nRBC: 0.9 % — ABNORMAL HIGH (ref 0.0–0.2)
nRBC: 3 /100 WBC — ABNORMAL HIGH

## 2022-08-01 LAB — GLUCOSE, CAPILLARY
Glucose-Capillary: 99 mg/dL (ref 70–99)
Glucose-Capillary: 99 mg/dL (ref 70–99)
Glucose-Capillary: 70 mg/dL (ref 70–99)
Glucose-Capillary: 72 mg/dL (ref 70–99)
Glucose-Capillary: 62 mg/dL — ABNORMAL LOW (ref 70–99)
Glucose-Capillary: 80 mg/dL (ref 70–99)
Glucose-Capillary: 90 mg/dL (ref 70–99)

## 2022-08-01 LAB — PREPARE RBC (CROSSMATCH)

## 2022-08-01 LAB — CBC
HCT: 24.1 % — ABNORMAL LOW (ref 39.0–52.0)
Hemoglobin: 7.4 g/dL — ABNORMAL LOW (ref 13.0–17.0)
MCH: 27.9 pg (ref 26.0–34.0)
MCHC: 30.7 g/dL (ref 30.0–36.0)
MCV: 90.9 fL (ref 80.0–100.0)
Platelets: 1210 10*3/uL (ref 150–400)
RBC: 2.65 MIL/uL — ABNORMAL LOW (ref 4.22–5.81)
RDW: 21.1 % — ABNORMAL HIGH (ref 11.5–15.5)
WBC: 16.6 10*3/uL — ABNORMAL HIGH (ref 4.0–10.5)
nRBC: 0.4 % — ABNORMAL HIGH (ref 0.0–0.2)

## 2022-08-01 LAB — TYPE AND SCREEN: Unit division: 0

## 2022-08-01 LAB — FOLATE: Folate: 6.6 ng/mL (ref >5.9)

## 2022-08-01 LAB — BPAM RBC: Blood Product Expiration Date: 202404302359

## 2022-08-01 LAB — PREPARE FRESH FROZEN PLASMA

## 2022-08-01 LAB — BPAM FFP
Blood Product Expiration Date: 202404072359
ISSUE DATE / TIME: 202404070646
ISSUE DATE / TIME: 202404070801
Unit Type and Rh: 6200

## 2022-08-01 MED ORDER — ORAL CARE MOUTH RINSE: 15.0000 mL | OROMUCOSAL | Status: DC | PRN

## 2022-08-01 MED ORDER — ONDANSETRON HCL 4 MG/2ML IJ SOLN
4.0000 mg | Freq: Four times a day (QID) | INTRAMUSCULAR | Status: DC | PRN
  Administered 2022-08-01 – 2022-09-01 (×22): 4 mg via INTRAVENOUS
  Filled 2022-08-01 (×22): qty 2

## 2022-08-01 MED ORDER — SODIUM CHLORIDE 0.9% IV SOLUTION: Freq: Once | INTRAVENOUS | Status: AC

## 2022-08-01 MED ORDER — ACETAMINOPHEN 650 MG RE SUPP
650.0000 mg | Freq: Once | RECTAL | Status: AC
  Administered 2022-08-01: 650 mg via RECTAL
  Filled 2022-08-01: qty 1

## 2022-08-01 MED ORDER — METHOCARBAMOL 1000 MG/10ML IJ SOLN
1000.0000 mg | Freq: Three times a day (TID) | INTRAVENOUS | Status: DC | PRN
  Administered 2022-08-01 – 2022-08-02 (×3): 1000 mg via INTRAVENOUS
  Filled 2022-08-01: qty 1000
  Filled 2022-08-01: qty 10
  Filled 2022-08-01: qty 1000

## 2022-08-01 MED ORDER — NOREPINEPHRINE 4 MG/250ML-% IV SOLN
0.0000 ug/min | INTRAVENOUS | Status: DC
  Administered 2022-08-01: 29 ug/min via INTRAVENOUS
  Administered 2022-08-01: 4 ug/min via INTRAVENOUS
  Filled 2022-08-01: qty 250

## 2022-08-01 MED ORDER — PANTOPRAZOLE SODIUM 40 MG IV SOLR
40.0000 mg | Freq: Two times a day (BID) | INTRAVENOUS | Status: DC
  Administered 2022-08-01 – 2022-08-13 (×24): 40 mg via INTRAVENOUS
  Filled 2022-08-01 (×25): qty 10

## 2022-08-01 MED ORDER — LACTATED RINGERS IV SOLN: INTRAVENOUS | Status: DC

## 2022-08-01 MED ORDER — SODIUM CHLORIDE 0.9 % IV BOLUS
1000.0000 mL | Freq: Once | INTRAVENOUS | Status: AC
  Administered 2022-08-01: 1000 mL via INTRAVENOUS

## 2022-08-01 MED ORDER — NOREPINEPHRINE 4 MG/250ML-% IV SOLN
INTRAVENOUS | Status: AC
  Filled 2022-08-01: qty 250

## 2022-08-01 MED ORDER — LACTATED RINGERS IV BOLUS
1000.0000 mL | Freq: Once | INTRAVENOUS | Status: AC
  Administered 2022-08-01: 1000 mL via INTRAVENOUS

## 2022-08-01 MED ORDER — HYDROMORPHONE HCL 1 MG/ML IJ SOLN
1.0000 mg | INTRAMUSCULAR | Status: DC | PRN
  Administered 2022-08-01 (×4): 2 mg via INTRAVENOUS
  Administered 2022-08-01: 2.5 mg via INTRAVENOUS
  Administered 2022-08-01: 2 mg via INTRAVENOUS
  Administered 2022-08-01 – 2022-08-02 (×4): 2.5 mg via INTRAVENOUS
  Filled 2022-08-01: qty 2
  Filled 2022-08-01 (×2): qty 3
  Filled 2022-08-01 (×3): qty 2
  Filled 2022-08-01 (×3): qty 3
  Filled 2022-08-01: qty 2

## 2022-08-01 MED ORDER — ALBUMIN HUMAN 5 % IV SOLN
25.0000 g | Freq: Once | INTRAVENOUS | Status: AC
  Administered 2022-08-01: 25 g via INTRAVENOUS
  Filled 2022-08-01: qty 500

## 2022-08-01 MED ORDER — IOHEXOL 350 MG/ML SOLN
75.0000 mL | Freq: Once | INTRAVENOUS | Status: AC | PRN
  Administered 2022-08-01: 75 mL via INTRAVENOUS

## 2022-08-01 MED ORDER — SODIUM CHLORIDE 0.9 % IV SOLN: INTRAVENOUS | Status: DC | PRN

## 2022-08-01 NOTE — Progress Notes (Signed)
Trauma Event Note  Assisted RN Trish with trip to CT for CT Head/C/A/P. Lower output in ileostomy this afternoon, now darker red. Vitals remain improved from this morning. Results called to Dr Dossie Der per radiology notation. No new orders received.  Last imported Vital Signs BP (!) 145/92   Pulse (!) 105   Temp 100.3 F (37.9 C) (Oral)   Resp (!) 33   Ht 5\' 10"  (1.778 m)   Wt 75.9 kg   SpO2 98%   BMI 24.01 kg/m   Trending CBC Recent Labs    07/31/22 0249 08/01/22 0414 08/01/22 1156  WBC 14.7* 16.6* 21.0*  HGB 8.9* 7.4* 9.5*  HCT 28.8* 24.1* 26.4*  PLT 710* 1,210* 544*   Trending BMET Recent Labs    07/31/22 0249 08/01/22 0414 08/01/22 1156  NA 140 137 135  K 3.5 4.0 4.2  CL 107 104 101  CO2 21* 20* 21*  BUN 26* 25* 30*  CREATININE 0.70 0.91 1.10  GLUCOSE 106* 136* 89   Shane Klein  Trauma Response RN  Please call TRN at [phone] for further assistance.
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
## 2022-08-01 NOTE — Progress Notes (Signed)
Following last infusion of FFP, patient began to complain of significant lower back pain in addition to his previous complaints of neck pain. Temp also spiked to 101.6. ?transfusion reaction. Of note, enteral tylenol also being held secondary to GIB. Blood bank made aware, recommending to pre-medicate with benadryl should patient require more blood.  Dr. Royanne Foots also made aware. Rectal suppository given as well as cold bath during clean up. Will continue to monitor temps.  Dilaudid dosage increased, and Robaxin changed to IV, both with good relief of pain. Patient resting comfortably at this time, vitals stable, Levo off.

## 2022-08-01 NOTE — Progress Notes (Addendum)
Patient ID: MONIQUE HOEG, male   DOB: 1990-10-17, 32 y.o.   MRN: [id number] Notified by TRN of change in condition. He C/O crampy abd pain and became hypotensive. Albumin bolus and on levo now. Labs sent. Started to have bloody ileostomy output. Will give 1u PRBC now as well. May need further eval of GIB. Add protonix and stop Lovenox.  Violeta Gelinas, MD, MPH, FACS Please use [email or web address] to contact on call provider
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
## 2022-08-01 NOTE — Progress Notes (Signed)
Patient requiring PRN Dilaudid q2 hours with short term relief. Continues to endorse significant lower back pain. Nothing visible noted upon assessment of back/flanks. Ileostomy noted with significantly less drainage than overnigh, and increased bloody drainage from JP drain compared to overnight. Dr. Royanne Foots made aware, CT's ordered. TRN to accompany transport.

## 2022-08-01 NOTE — Progress Notes (Addendum)
Inpatient Rehab Admissions Coordinator:  Consult received. Pt lethargic due to medication. NSG advised contact pt's mother. Attempted to contact pt's mother. No one answered and unable to leave a message. Will continue to follow.  ADDENDUM 1151: Pt's mother called AC. Explained CIR goals and expectations. Discussed average length of stay, insurance authorization requirement, discharge home after completion of CIR. She acknowledged understanding. She is interested in pt pursuing CIR. She confirmed that she and other family will be able to provide 24/7 support for pt after discharge.   Wolfgang Phoenix, MS, CCC-SLP Admissions Coordinator [phone]

## 2022-08-01 NOTE — Procedures (Signed)
Central line  Date/Time: 08/01/2022 6:03 AM  Performed by: Violeta Gelinas, MD Authorized by: Violeta Gelinas, MD   Consent:    Consent obtained:  Verbal   Consent given by:  Parent   Risks, benefits, and alternatives were discussed: yes   Universal protocol:    Procedure explained and questions answered to patient or proxy's satisfaction: yes     Relevant documents present and verified: yes     Test results available: yes     Imaging studies available: yes     Required blood products, implants, devices, and special equipment available: yes     Immediately prior to procedure, a time out was called: yes     Patient identity confirmed:  Arm band Pre-procedure details:    Indication(s): central venous access     Hand hygiene: Hand hygiene performed prior to insertion     Sterile barrier technique: All elements of maximal sterile technique followed     Skin preparation:  Chlorhexidine Sedation:    Sedation type:  None Anesthesia:    Anesthesia method:  Local infiltration Procedure details:    Location:  L subclavian   Patient position:  Trendelenburg   Procedural supplies:  Triple lumen   Ultrasound guidance: no     Number of attempts:  1   Successful placement: yes   Post-procedure details:    Post-procedure:  Dressing applied and line sutured   Assessment:  Blood return through all ports   Procedure completion:  Tolerated

## 2022-08-01 NOTE — Progress Notes (Signed)
Small amount of blood clots noted from rectum during bath.No further bleeding noted from ileostomy. MD aware. Repeat CBC BMP in process.

## 2022-08-01 NOTE — Procedures (Signed)
Arterial Catheter Insertion Procedure Note  IMAM WASLEY  [id number]  14-Apr-1991  Date:08/01/22  Time:8:58 AM    Provider Performing: Ermalinda Memos E    Procedure: Insertion of Arterial Line (32992) without US guidance  Indication(s) Blood pressure monitoring and/or need for frequent ABGs  Consent Risks of the procedure as well as the alternatives and risks of each were explained to the patient and/or caregiver.  Consent for the procedure was obtained and is signed in the bedside chart  Anesthesia None   Time Out Verified patient identification, verified procedure, site/side was marked, verified correct patient position, special equipment/implants available, medications/allergies/relevant history reviewed, required imaging and test results available.   Sterile Technique Maximal sterile technique including full sterile barrier drape, hand hygiene, sterile gown, sterile gloves, mask, hair covering, sterile ultrasound probe cover (if used).   Procedure Description Area of catheter insertion was cleaned with chlorhexidine and draped in sterile fashion. With real-time ultrasound guidance an arterial catheter was placed into the right radial artery.  Appropriate arterial tracings confirmed on monitor.     Complications/Tolerance None; patient tolerated the procedure well.     Specimen(s) None

## 2022-08-01 NOTE — Progress Notes (Signed)
Trauma Event Note  To bedside this morning for bedside report from TRN Autumn. Per report patient with acute onset tachycardia and hypotension at 0345. Levophed initiated and frank red blood noted to patients ileostomy bag. STAT HGB 7.4. Patient received total 4 RBCs/4 FFP. HR elevated to 160s, max levophed 35 mcg. Vitals normalized to HR in 110s and levophed off by 0930. Bed change revealed some bleeding from rectum, MD aware.   See RN notes about possible transfusion reaction. Blood bank and MD aware, very unlikely related to blood transfusion per blood bank. Recommend continuous monitoring and pretreatment with benadryl if another transfusion is required.  Trending CBC Recent Labs    07/31/22 0249 08/01/22 0414 08/01/22 1156  WBC 14.7* 16.6* 21.0*  HGB 8.9* 7.4* 9.5*  HCT 28.8* 24.1* 26.4*  PLT 710* 1,210* 544*   Trending BMET Recent Labs    07/31/22 0249 08/01/22 0414 08/01/22 1156  NA 140 137 135  K 3.5 4.0 4.2  CL 107 104 101  CO2 21* 20* 21*  BUN 26* 25* 30*  CREATININE 0.70 0.91 1.10  GLUCOSE 106* 136* 89   Shane Klein L Sharene Krikorian  Trauma Response RN  Please call TRN at [phone] for further assistance.

## 2022-08-02 ENCOUNTER — Inpatient Hospital Stay (HOSPITAL_COMMUNITY): Payer: Medicaid Other

## 2022-08-02 LAB — BPAM FFP
Blood Product Expiration Date: 202404072359
Blood Product Expiration Date: 202404102359
Blood Product Expiration Date: 202404102359
ISSUE DATE / TIME: 202404070646
ISSUE DATE / TIME: 202404070801
Unit Type and Rh: 6200
Unit Type and Rh: 6200
Unit Type and Rh: 6200

## 2022-08-02 LAB — PREPARE FRESH FROZEN PLASMA
Unit division: 0
Unit division: 0
Unit division: 0
Unit division: 0

## 2022-08-02 LAB — GLUCOSE, CAPILLARY
Glucose-Capillary: 102 mg/dL — ABNORMAL HIGH (ref 70–99)
Glucose-Capillary: 123 mg/dL — ABNORMAL HIGH (ref 70–99)
Glucose-Capillary: 61 mg/dL — ABNORMAL LOW (ref 70–99)
Glucose-Capillary: 80 mg/dL (ref 70–99)
Glucose-Capillary: 82 mg/dL (ref 70–99)
Glucose-Capillary: 84 mg/dL (ref 70–99)
Glucose-Capillary: 85 mg/dL (ref 70–99)

## 2022-08-02 LAB — BPAM RBC
ISSUE DATE / TIME: 202404070528
ISSUE DATE / TIME: 202404070615
Unit Type and Rh: 6200

## 2022-08-02 LAB — TYPE AND SCREEN: Unit division: 0

## 2022-08-02 MED ORDER — OXYCODONE HCL 5 MG PO TABS
10.0000 mg | ORAL_TABLET | ORAL | Status: DC | PRN
  Administered 2022-08-02: 15 mg
  Filled 2022-08-02: qty 3

## 2022-08-02 MED ORDER — ACETAMINOPHEN 500 MG PO TABS: 1000.0000 mg | ORAL_TABLET | Freq: Four times a day (QID) | ORAL | Status: DC

## 2022-08-02 MED ORDER — VITAMIN C 500 MG PO TABS
1000.0000 mg | ORAL_TABLET | Freq: Every day | ORAL | Status: DC
  Administered 2022-08-04 – 2022-08-05 (×2): 1000 mg
  Filled 2022-08-02 (×4): qty 2

## 2022-08-02 MED ORDER — ACETAMINOPHEN 500 MG PO TABS
1000.0000 mg | ORAL_TABLET | Freq: Four times a day (QID) | ORAL | Status: DC
  Administered 2022-08-02 – 2022-08-04 (×8): 1000 mg
  Filled 2022-08-02 (×10): qty 2

## 2022-08-02 MED ORDER — DEXTROSE 50 % IV SOLN
INTRAVENOUS | Status: AC
  Administered 2022-08-02: 50 mL
  Filled 2022-08-02: qty 50

## 2022-08-02 MED ORDER — QUETIAPINE FUMARATE 25 MG PO TABS: 25.0000 mg | ORAL_TABLET | Freq: Two times a day (BID) | ORAL | Status: DC

## 2022-08-02 MED ORDER — DEXTROSE-NACL 5-0.9 % IV SOLN: INTRAVENOUS | Status: AC

## 2022-08-02 MED ORDER — VITAMIN D 25 MCG (1000 UNIT) PO TABS
2000.0000 [IU] | ORAL_TABLET | Freq: Two times a day (BID) | ORAL | Status: DC
  Administered 2022-08-04 (×2): 2000 [IU]
  Filled 2022-08-02 (×4): qty 2

## 2022-08-02 MED ORDER — METHOCARBAMOL 500 MG PO TABS
1000.0000 mg | ORAL_TABLET | Freq: Three times a day (TID) | ORAL | Status: DC
  Administered 2022-08-02 – 2022-08-05 (×7): 1000 mg
  Filled 2022-08-02 (×8): qty 2

## 2022-08-02 MED ORDER — HYDROMORPHONE HCL 1 MG/ML IJ SOLN
1.0000 mg | INTRAMUSCULAR | Status: DC | PRN
  Administered 2022-08-02 – 2022-08-05 (×18): 2 mg via INTRAVENOUS
  Filled 2022-08-02 (×18): qty 2

## 2022-08-02 MED ORDER — OXYCODONE HCL 5 MG PO TABS
10.0000 mg | ORAL_TABLET | ORAL | Status: DC | PRN
  Administered 2022-08-02: 15 mg via ORAL
  Filled 2022-08-02: qty 3

## 2022-08-02 MED ORDER — PROMETHAZINE HCL 25 MG RE SUPP: 25.0000 mg | Freq: Four times a day (QID) | RECTAL | Status: DC | PRN

## 2022-08-02 MED ORDER — PROMETHAZINE HCL 25 MG PO TABS
25.0000 mg | ORAL_TABLET | Freq: Four times a day (QID) | ORAL | Status: DC | PRN
  Administered 2022-08-04: 25 mg via ORAL
  Filled 2022-08-02 (×3): qty 1

## 2022-08-02 MED ORDER — QUETIAPINE FUMARATE 50 MG PO TABS
25.0000 mg | ORAL_TABLET | Freq: Two times a day (BID) | ORAL | Status: DC
  Administered 2022-08-02 – 2022-08-04 (×5): 25 mg
  Filled 2022-08-02 (×5): qty 1

## 2022-08-02 MED ORDER — TRAMADOL HCL 50 MG PO TABS
50.0000 mg | ORAL_TABLET | Freq: Four times a day (QID) | ORAL | Status: DC
  Administered 2022-08-03 – 2022-08-05 (×10): 50 mg
  Filled 2022-08-02 (×10): qty 1

## 2022-08-02 MED ORDER — HYDROCORTISONE SOD SUC (PF) 100 MG IJ SOLR
50.0000 mg | Freq: Every day | INTRAMUSCULAR | Status: DC
  Administered 2022-08-03 – 2022-08-04 (×2): 50 mg via INTRAVENOUS
  Filled 2022-08-02 (×2): qty 2
  Filled 2022-08-02: qty 1

## 2022-08-02 MED ORDER — METOCLOPRAMIDE HCL 5 MG/ML IJ SOLN
5.0000 mg | Freq: Once | INTRAMUSCULAR | Status: AC
  Administered 2022-08-02: 5 mg via INTRAVENOUS
  Filled 2022-08-02: qty 2

## 2022-08-02 MED ORDER — BOOST / RESOURCE BREEZE PO LIQD CUSTOM: 1.0000 | Freq: Three times a day (TID) | ORAL | Status: DC

## 2022-08-02 MED ORDER — SODIUM CHLORIDE 0.9 % IV SOLN
25.0000 mg | Freq: Four times a day (QID) | INTRAVENOUS | Status: DC | PRN
  Administered 2022-08-03 – 2022-08-30 (×4): 25 mg via INTRAVENOUS
  Filled 2022-08-02 (×5): qty 1
  Filled 2022-08-02: qty 25

## 2022-08-02 MED ORDER — OXYCODONE HCL 5 MG PO TABS
10.0000 mg | ORAL_TABLET | ORAL | Status: DC | PRN
  Administered 2022-08-02 – 2022-08-06 (×15): 15 mg
  Filled 2022-08-02 (×15): qty 3

## 2022-08-02 MED ORDER — METHOCARBAMOL 500 MG PO TABS
1000.0000 mg | ORAL_TABLET | Freq: Three times a day (TID) | ORAL | Status: DC
  Administered 2022-08-02: 1000 mg via ORAL
  Filled 2022-08-02: qty 2

## 2022-08-02 NOTE — Progress Notes (Signed)
Call from Detective Mosqueda [employer]. He asked if pt is well enough to speak at this time. Pt prefers not to meet with Det Mosqueda today. Confirmed that pt will not leave ICU today per his primary RN. Detective to follow up tomorrow.

## 2022-08-02 NOTE — Progress Notes (Signed)
Inpatient Rehab Admissions Coordinator:  Saw pt at bedside. Discussed CIR goals and expectations. Pt acknowledged understanding. Pt interested in pursuing CIR. Will continue to follow.   Wolfgang Phoenix, MS, CCC-SLP Admissions Coordinator [phone]

## 2022-08-02 NOTE — Progress Notes (Signed)
RN attempted to place NG tube and Pt refused. MD and Trauma RN notified.
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
## 2022-08-02 NOTE — Procedures (Signed)
Cortrak  Tube Type:  Cortrak - 43 inches Tube Location:  Left nare Secured by: Bridle Technique Used to Measure Tube Placement:  Marking at nare/corner of mouth Cortrak Secured At:  80 cm   Cortrak Tube Team Note:  Consult received to advance the Cortrak feeding tube post-pyloric. RD unable to advance the tube post pyloric. Recommend fluoroscopy guided advancement if advancement is still needed.   X-ray is required, abdominal x-ray has been ordered by the Cortrak team. Please confirm tube placement before using the Cortrak tube.   If the tube becomes dislodged please keep the tube and contact the Cortrak team at [email or web address] for replacement.  If after hours and replacement cannot be delayed, place a NG tube and confirm placement with an abdominal x-ray.    Betsey Holiday MS, RD, LDN Please refer to [facility] for RD and/or RD on-call/weekend/after hours pager
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
## 2022-08-03 ENCOUNTER — Inpatient Hospital Stay (HOSPITAL_COMMUNITY): Payer: Medicaid Other

## 2022-08-03 DIAGNOSIS — S42411S Displaced simple supracondylar fracture without intercondylar fracture of right humerus, sequela: Secondary | ICD-10-CM | POA: Diagnosis not present

## 2022-08-03 DIAGNOSIS — S34109D Unspecified injury to unspecified level of lumbar spinal cord, subsequent encounter: Secondary | ICD-10-CM

## 2022-08-03 DIAGNOSIS — W3400XA Accidental discharge from unspecified firearms or gun, initial encounter: Secondary | ICD-10-CM

## 2022-08-03 LAB — CBC
HCT: 18.9 % — ABNORMAL LOW (ref 39.0–52.0)
Hemoglobin: 6.4 g/dL — CL (ref 13.0–17.0)
MCH: 28.7 pg (ref 26.0–34.0)
MCHC: 33.9 g/dL (ref 30.0–36.0)
MCV: 84.8 fL (ref 80.0–100.0)
Platelets: 658 10*3/uL — ABNORMAL HIGH (ref 150–400)
RBC: 2.23 MIL/uL — ABNORMAL LOW (ref 4.22–5.81)
RDW: 19.1 % — ABNORMAL HIGH (ref 11.5–15.5)
WBC: 14.5 10*3/uL — ABNORMAL HIGH (ref 4.0–10.5)
nRBC: 0.3 % — ABNORMAL HIGH (ref 0.0–0.2)

## 2022-08-03 LAB — BASIC METABOLIC PANEL
Anion gap: 5 (ref 5–15)
BUN: 24 mg/dL — ABNORMAL HIGH (ref 6–20)
CO2: 25 mmol/L (ref 22–32)
Calcium: 7.8 mg/dL — ABNORMAL LOW (ref 8.9–10.3)
Chloride: 109 mmol/L (ref 98–111)
Creatinine, Ser: 0.83 mg/dL (ref 0.61–1.24)
GFR, Estimated: >60 mL/min (ref >60)
Glucose, Bld: 86 mg/dL (ref 70–99)
Potassium: 3.6 mmol/L (ref 3.5–5.1)
Sodium: 139 mmol/L (ref 135–145)

## 2022-08-03 LAB — GLUCOSE, CAPILLARY
Glucose-Capillary: 70 mg/dL (ref 70–99)
Glucose-Capillary: 81 mg/dL (ref 70–99)
Glucose-Capillary: 81 mg/dL (ref 70–99)
Glucose-Capillary: 84 mg/dL (ref 70–99)
Glucose-Capillary: 86 mg/dL (ref 70–99)
Glucose-Capillary: 91 mg/dL (ref 70–99)

## 2022-08-03 LAB — HEMOGLOBIN AND HEMATOCRIT, BLOOD
HCT: 22.7 % — ABNORMAL LOW (ref 39.0–52.0)
Hemoglobin: 7.6 g/dL — ABNORMAL LOW (ref 13.0–17.0)

## 2022-08-03 LAB — ZINC: Zinc: 61 ug/dL (ref 44–115)

## 2022-08-03 LAB — TYPE AND SCREEN: Antibody Screen: NEGATIVE

## 2022-08-03 LAB — BPAM RBC
Blood Product Expiration Date: 202404302359
Unit Type and Rh: 5100
Unit Type and Rh: 6200

## 2022-08-03 LAB — PREPARE RBC (CROSSMATCH)

## 2022-08-03 MED ORDER — SODIUM CHLORIDE 0.9% IV SOLUTION: Freq: Once | INTRAVENOUS | Status: AC

## 2022-08-03 MED ORDER — DIPHENHYDRAMINE HCL 50 MG/ML IJ SOLN
25.0000 mg | Freq: Once | INTRAMUSCULAR | Status: AC
  Administered 2022-08-03: 25 mg via INTRAVENOUS
  Filled 2022-08-03: qty 1

## 2022-08-03 MED ORDER — TRAVASOL 10 % IV SOLN
INTRAVENOUS | Status: AC
  Filled 2022-08-03: qty 518.4

## 2022-08-03 MED ORDER — DEXTROSE-NACL 5-0.9 % IV SOLN: INTRAVENOUS | Status: DC
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
## 2022-08-03 NOTE — Plan of Care (Signed)
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
## 2022-08-03 NOTE — Progress Notes (Signed)
Inpatient Rehabilitation Admissions Coordinator   I met with patient and his Mom at bedside. We discussed goals and expectations of a CIR admit. Please also refer to Dr Rosalyn Charters consult. I will follow and await further progress before beginning Auth with insurance for CIR admit.  Ottie Glazier, RN, MSN Rehab Admissions Coordinator [phone] 08/03/2022 11:51 AM

## 2022-08-03 NOTE — Consult Note (Signed)
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 swallowing Can the patient actively participate in an intensive therapy program of at least 3 hrs of therapy per day at least 5 days per week? Yes and Potentially The potential for patient to make measurable gains while on inpatient rehab is excellent Anticipated functional outcomes upon discharge from inpatient rehab are min assist  with PT, min assist and mod assist with OT, modified independent with SLP. Estimated rehab length of stay to reach the above functional goals is: 20-28 days Anticipated discharge destination: Home Overall Rehab/Functional Prognosis: excellent  POST ACUTE RECOMMENDATIONS: This patient's condition is appropriate for continued rehabilitative care in the following setting: CIR Patient has agreed to participate in recommended program. Yes Note that insurance prior authorization may be required for reimbursement for recommended care.  Comment: I spoke to patient and mom at length. We discussed that he will need physical assist once home given his multiple injuries and likely spinal cord injury. Mom says that physical assistance is available once he's home   MEDICAL RECOMMENDATIONS: Consider dedicated CT of  thoraco-lumbar spine to assess extent of upper left cord injury and to help establish prognosis.  Screening bilateral LE venous dopplers Lovenox 30mg  q12 for DVT prophylaxis when clear from a surgical standpoint.   I have personally performed a face to face diagnostic evaluation of this patient. Additionally, I have examined the patient's medical record including any pertinent labs and radiographic images. If the physician assistant has documented in this note, I have reviewed and edited or otherwise concur with the physician assistant's documentation.  Thanks,  Ranelle OysterZachary T Lyfe Monger, MD 08/03/2022
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
## 2022-08-03 NOTE — Progress Notes (Signed)
Date and time results received: 08/03/22 1000   Test: Hb  Critical Value: 6.4  Name of Provider Notified: Dr. Janee Morn   Orders Received? Or Actions Taken?: 1 unit pRBCs
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
## 2022-08-04 ENCOUNTER — Inpatient Hospital Stay (HOSPITAL_COMMUNITY): Payer: Medicaid Other

## 2022-08-04 LAB — COMPREHENSIVE METABOLIC PANEL
ALT: 26 U/L (ref 0–44)
AST: 23 U/L (ref 15–41)
Albumin: 1.7 g/dL — ABNORMAL LOW (ref 3.5–5.0)
Alkaline Phosphatase: 64 U/L (ref 38–126)
Anion gap: 9 (ref 5–15)
BUN: 27 mg/dL — ABNORMAL HIGH (ref 6–20)
CO2: 23 mmol/L (ref 22–32)
Calcium: 8 mg/dL — ABNORMAL LOW (ref 8.9–10.3)
Chloride: 105 mmol/L (ref 98–111)
Creatinine, Ser: 0.79 mg/dL (ref 0.61–1.24)
GFR, Estimated: >60 mL/min (ref >60)
Glucose, Bld: 98 mg/dL (ref 70–99)
Potassium: 3.3 mmol/L — ABNORMAL LOW (ref 3.5–5.1)
Sodium: 137 mmol/L (ref 135–145)
Total Bilirubin: 1 mg/dL (ref 0.3–1.2)
Total Protein: 5.5 g/dL — ABNORMAL LOW (ref 6.5–8.1)

## 2022-08-04 LAB — CBC
HCT: 22.6 % — ABNORMAL LOW (ref 39.0–52.0)
Hemoglobin: 7.5 g/dL — ABNORMAL LOW (ref 13.0–17.0)
MCH: 28.6 pg (ref 26.0–34.0)
MCHC: 33.2 g/dL (ref 30.0–36.0)
MCV: 86.3 fL (ref 80.0–100.0)
Platelets: 804 10*3/uL — ABNORMAL HIGH (ref 150–400)
RBC: 2.62 MIL/uL — ABNORMAL LOW (ref 4.22–5.81)
RDW: 18.2 % — ABNORMAL HIGH (ref 11.5–15.5)
WBC: 14.9 10*3/uL — ABNORMAL HIGH (ref 4.0–10.5)
nRBC: 0.3 % — ABNORMAL HIGH (ref 0.0–0.2)

## 2022-08-04 LAB — TYPE AND SCREEN
ABO/RH(D): A POS
Unit division: 0
Unit division: 0
Unit division: 0

## 2022-08-04 LAB — GLUCOSE, CAPILLARY
Glucose-Capillary: 94 mg/dL (ref 70–99)
Glucose-Capillary: 100 mg/dL — ABNORMAL HIGH (ref 70–99)
Glucose-Capillary: 108 mg/dL — ABNORMAL HIGH (ref 70–99)
Glucose-Capillary: 90 mg/dL (ref 70–99)
Glucose-Capillary: 91 mg/dL (ref 70–99)

## 2022-08-04 LAB — BPAM RBC
Blood Product Expiration Date: 202404272359
Blood Product Expiration Date: 202404302359
Blood Product Expiration Date: 202405062359
ISSUE DATE / TIME: 202404070440
ISSUE DATE / TIME: 202404070646
ISSUE DATE / TIME: 202404091028
Unit Type and Rh: 6200
Unit Type and Rh: 6200

## 2022-08-04 LAB — MAGNESIUM: Magnesium: 1.9 mg/dL (ref 1.7–2.4)

## 2022-08-04 LAB — TRIGLYCERIDES: Triglycerides: 227 mg/dL — ABNORMAL HIGH (ref <150)

## 2022-08-04 LAB — PHOSPHORUS: Phosphorus: 1.9 mg/dL — ABNORMAL LOW (ref 2.5–4.6)

## 2022-08-04 MED ORDER — VITAMIN B-12 1000 MCG PO TABS
1000.0000 ug | ORAL_TABLET | Freq: Every day | ORAL | Status: DC
  Administered 2022-08-04 – 2022-09-03 (×28): 1000 ug via ORAL
  Filled 2022-08-04 (×30): qty 1

## 2022-08-04 MED ORDER — KATE FARMS STANDARD 1.4 PO LIQD
325.0000 mL | Freq: Two times a day (BID) | ORAL | Status: DC
  Administered 2022-08-04 – 2022-08-05 (×3): 325 mL via ORAL
  Filled 2022-08-04 (×7): qty 325

## 2022-08-04 MED ORDER — TRAVASOL 10 % IV SOLN
INTRAVENOUS | Status: AC
  Filled 2022-08-04: qty 648

## 2022-08-04 MED ORDER — MAGNESIUM SULFATE 2 GM/50ML IV SOLN
2.0000 g | Freq: Once | INTRAVENOUS | Status: AC
  Administered 2022-08-04: 2 g via INTRAVENOUS
  Filled 2022-08-04: qty 50

## 2022-08-04 MED ORDER — CALCIUM CARBONATE ANTACID 500 MG PO CHEW
1.0000 | CHEWABLE_TABLET | Freq: Three times a day (TID) | ORAL | Status: DC | PRN
  Administered 2022-08-04 – 2022-08-06 (×4): 200 mg via ORAL
  Filled 2022-08-04 (×6): qty 1

## 2022-08-04 MED ORDER — TAMSULOSIN HCL 0.4 MG PO CAPS
0.4000 mg | ORAL_CAPSULE | Freq: Every day | ORAL | Status: DC
  Administered 2022-08-04 – 2022-09-03 (×28): 0.4 mg via ORAL
  Filled 2022-08-04 (×30): qty 1

## 2022-08-04 MED ORDER — POTASSIUM CHLORIDE 10 MEQ/50ML IV SOLN
10.0000 meq | INTRAVENOUS | Status: AC
  Administered 2022-08-04 (×2): 10 meq via INTRAVENOUS
  Filled 2022-08-04 (×2): qty 50

## 2022-08-04 MED ORDER — INSULIN ASPART 100 UNIT/ML IJ SOLN: 0.0000 [IU] | Freq: Four times a day (QID) | INTRAMUSCULAR | Status: DC

## 2022-08-04 MED ORDER — POTASSIUM PHOSPHATES 15 MMOLE/5ML IV SOLN
30.0000 mmol | Freq: Once | INTRAVENOUS | Status: AC
  Administered 2022-08-04: 30 mmol via INTRAVENOUS
  Filled 2022-08-04: qty 10
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
## 2022-08-05 ENCOUNTER — Inpatient Hospital Stay (HOSPITAL_COMMUNITY): Payer: Medicaid Other

## 2022-08-05 LAB — CBC
HCT: 24 % — ABNORMAL LOW (ref 39.0–52.0)
Hemoglobin: 7.9 g/dL — ABNORMAL LOW (ref 13.0–17.0)
MCH: 28.5 pg (ref 26.0–34.0)
MCHC: 32.9 g/dL (ref 30.0–36.0)
MCV: 86.6 fL (ref 80.0–100.0)
Platelets: 875 10*3/uL — ABNORMAL HIGH (ref 150–400)
RBC: 2.77 MIL/uL — ABNORMAL LOW (ref 4.22–5.81)
RDW: 18.1 % — ABNORMAL HIGH (ref 11.5–15.5)
WBC: 18.3 10*3/uL — ABNORMAL HIGH (ref 4.0–10.5)
nRBC: 0.3 % — ABNORMAL HIGH (ref 0.0–0.2)

## 2022-08-05 LAB — COMPREHENSIVE METABOLIC PANEL
ALT: 22 U/L (ref 0–44)
AST: 18 U/L (ref 15–41)
Albumin: 1.7 g/dL — ABNORMAL LOW (ref 3.5–5.0)
Alkaline Phosphatase: 60 U/L (ref 38–126)
Anion gap: 8 (ref 5–15)
BUN: 30 mg/dL — ABNORMAL HIGH (ref 6–20)
CO2: 22 mmol/L (ref 22–32)
Calcium: 7.9 mg/dL — ABNORMAL LOW (ref 8.9–10.3)
Chloride: 104 mmol/L (ref 98–111)
Creatinine, Ser: 0.67 mg/dL (ref 0.61–1.24)
GFR, Estimated: >60 mL/min (ref >60)
Glucose, Bld: 99 mg/dL (ref 70–99)
Potassium: 3.6 mmol/L (ref 3.5–5.1)
Sodium: 134 mmol/L — ABNORMAL LOW (ref 135–145)
Total Bilirubin: 1 mg/dL (ref 0.3–1.2)
Total Protein: 5.6 g/dL — ABNORMAL LOW (ref 6.5–8.1)

## 2022-08-05 LAB — TRIGLYCERIDES: Triglycerides: 165 mg/dL — ABNORMAL HIGH (ref <150)

## 2022-08-05 LAB — PHOSPHORUS: Phosphorus: 2.7 mg/dL (ref 2.5–4.6)

## 2022-08-05 LAB — GLUCOSE, CAPILLARY
Glucose-Capillary: 102 mg/dL — ABNORMAL HIGH (ref 70–99)
Glucose-Capillary: 96 mg/dL (ref 70–99)
Glucose-Capillary: 91 mg/dL (ref 70–99)
Glucose-Capillary: 108 mg/dL — ABNORMAL HIGH (ref 70–99)

## 2022-08-05 LAB — MAGNESIUM: Magnesium: 1.9 mg/dL (ref 1.7–2.4)

## 2022-08-05 MED ORDER — PREDNISONE 5 MG PO TABS: 5.0000 mg | ORAL_TABLET | Freq: Every day | ORAL | Status: DC

## 2022-08-05 MED ORDER — QUETIAPINE FUMARATE 50 MG PO TABS
25.0000 mg | ORAL_TABLET | Freq: Two times a day (BID) | ORAL | Status: DC
  Administered 2022-08-05 (×2): 25 mg via ORAL
  Filled 2022-08-05 (×3): qty 1

## 2022-08-05 MED ORDER — METOPROLOL TARTRATE 5 MG/5ML IV SOLN
5.0000 mg | Freq: Four times a day (QID) | INTRAVENOUS | Status: DC | PRN
  Administered 2022-08-05 – 2022-08-06 (×2): 5 mg via INTRAVENOUS
  Filled 2022-08-05 (×2): qty 5

## 2022-08-05 MED ORDER — ACETAMINOPHEN 500 MG PO TABS
1000.0000 mg | ORAL_TABLET | Freq: Four times a day (QID) | ORAL | Status: DC
  Administered 2022-08-05 (×2): 1000 mg via ORAL
  Filled 2022-08-05 (×3): qty 2

## 2022-08-05 MED ORDER — TRAVASOL 10 % IV SOLN
INTRAVENOUS | Status: AC
  Filled 2022-08-05: qty 1101.6

## 2022-08-05 MED ORDER — VITAMIN D 25 MCG (1000 UNIT) PO TABS
2000.0000 [IU] | ORAL_TABLET | Freq: Two times a day (BID) | ORAL | Status: DC
  Administered 2022-08-05 – 2022-09-03 (×51): 2000 [IU] via ORAL
  Filled 2022-08-05 (×55): qty 2

## 2022-08-05 MED ORDER — ENOXAPARIN SODIUM 30 MG/0.3ML IJ SOSY
30.0000 mg | PREFILLED_SYRINGE | Freq: Two times a day (BID) | INTRAMUSCULAR | Status: DC
  Administered 2022-08-05 – 2022-08-12 (×13): 30 mg via SUBCUTANEOUS
  Filled 2022-08-05 (×14): qty 0.3

## 2022-08-05 MED ORDER — MAGNESIUM SULFATE 2 GM/50ML IV SOLN
2.0000 g | Freq: Once | INTRAVENOUS | Status: AC
  Administered 2022-08-05: 2 g via INTRAVENOUS
  Filled 2022-08-05: qty 50

## 2022-08-05 MED ORDER — HYDROMORPHONE HCL 1 MG/ML IJ SOLN
1.0000 mg | INTRAMUSCULAR | Status: DC | PRN
  Administered 2022-08-05 – 2022-08-07 (×13): 1 mg via INTRAVENOUS
  Filled 2022-08-05 (×14): qty 1

## 2022-08-05 MED ORDER — TRAMADOL HCL 50 MG PO TABS
50.0000 mg | ORAL_TABLET | Freq: Four times a day (QID) | ORAL | Status: DC
  Administered 2022-08-05 – 2022-09-01 (×90): 50 mg via ORAL
  Filled 2022-08-05 (×92): qty 1

## 2022-08-05 MED ORDER — INSULIN ASPART 100 UNIT/ML IJ SOLN: 0.0000 [IU] | Freq: Three times a day (TID) | INTRAMUSCULAR | Status: DC

## 2022-08-05 MED ORDER — LACTATED RINGERS IV BOLUS
1000.0000 mL | Freq: Once | INTRAVENOUS | Status: AC
  Administered 2022-08-05: 1000 mL via INTRAVENOUS

## 2022-08-05 MED ORDER — TRAVASOL 10 % IV SOLN: INTRAVENOUS | Status: DC

## 2022-08-05 MED ORDER — METHOCARBAMOL 500 MG PO TABS
1000.0000 mg | ORAL_TABLET | Freq: Three times a day (TID) | ORAL | Status: DC
  Administered 2022-08-05 (×2): 1000 mg via ORAL
  Filled 2022-08-05 (×2): qty 2

## 2022-08-05 MED ORDER — PREDNISONE 5 MG PO TABS
10.0000 mg | ORAL_TABLET | Freq: Every day | ORAL | Status: DC
  Administered 2022-08-06: 10 mg via ORAL
  Filled 2022-08-05 (×3): qty 2

## 2022-08-05 MED ORDER — POTASSIUM CHLORIDE 10 MEQ/50ML IV SOLN
10.0000 meq | INTRAVENOUS | Status: AC
  Administered 2022-08-05 (×4): 10 meq via INTRAVENOUS
  Filled 2022-08-05: qty 50

## 2022-08-05 NOTE — Progress Notes (Signed)
Informed Dr. Derrell Lolling about pt persistent elevated HR in the high 140s after 1L of LR was admin.  1 additional liter bolus of LR ordered.  Will continue plan of care.   Erick Blinks, RN
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
## 2022-08-05 NOTE — Progress Notes (Signed)
Informed Dr Janee Morn of elevated HR 140s.  1liter LR bolus ordered.  Erick Blinks, RN

## 2022-08-06 ENCOUNTER — Inpatient Hospital Stay (HOSPITAL_COMMUNITY): Payer: Medicaid Other

## 2022-08-06 DIAGNOSIS — F4310 Post-traumatic stress disorder, unspecified: Secondary | ICD-10-CM

## 2022-08-06 DIAGNOSIS — W3400XA Accidental discharge from unspecified firearms or gun, initial encounter: Secondary | ICD-10-CM | POA: Diagnosis not present

## 2022-08-06 LAB — MAGNESIUM: Magnesium: 1.7 mg/dL (ref 1.7–2.4)

## 2022-08-06 LAB — BASIC METABOLIC PANEL
Anion gap: 10 (ref 5–15)
Anion gap: 9 (ref 5–15)
BUN: 26 mg/dL — ABNORMAL HIGH (ref 6–20)
BUN: 23 mg/dL — ABNORMAL HIGH (ref 6–20)
CO2: 22 mmol/L (ref 22–32)
CO2: 20 mmol/L — ABNORMAL LOW (ref 22–32)
Calcium: 7.9 mg/dL — ABNORMAL LOW (ref 8.9–10.3)
Calcium: 7.8 mg/dL — ABNORMAL LOW (ref 8.9–10.3)
Chloride: 102 mmol/L (ref 98–111)
Chloride: 104 mmol/L (ref 98–111)
Creatinine, Ser: 0.64 mg/dL (ref 0.61–1.24)
Creatinine, Ser: 0.61 mg/dL (ref 0.61–1.24)
GFR, Estimated: >60 mL/min (ref >60)
GFR, Estimated: >60 mL/min (ref >60)
Glucose, Bld: 104 mg/dL — ABNORMAL HIGH (ref 70–99)
Glucose, Bld: 126 mg/dL — ABNORMAL HIGH (ref 70–99)
Potassium: 4.1 mmol/L (ref 3.5–5.1)
Potassium: 4.4 mmol/L (ref 3.5–5.1)
Sodium: 134 mmol/L — ABNORMAL LOW (ref 135–145)
Sodium: 133 mmol/L — ABNORMAL LOW (ref 135–145)

## 2022-08-06 LAB — ZINC: Zinc: 62 ug/dL (ref 44–115)

## 2022-08-06 LAB — CBC
HCT: 24 % — ABNORMAL LOW (ref 39.0–52.0)
Hemoglobin: 8.3 g/dL — ABNORMAL LOW (ref 13.0–17.0)
MCH: 29.4 pg (ref 26.0–34.0)
MCHC: 34.6 g/dL (ref 30.0–36.0)
MCV: 85.1 fL (ref 80.0–100.0)
Platelets: 826 10*3/uL — ABNORMAL HIGH (ref 150–400)
RBC: 2.82 MIL/uL — ABNORMAL LOW (ref 4.22–5.81)
RDW: 18.3 % — ABNORMAL HIGH (ref 11.5–15.5)
WBC: 22.2 10*3/uL — ABNORMAL HIGH (ref 4.0–10.5)
nRBC: 0.2 % (ref 0.0–0.2)

## 2022-08-06 LAB — GLUCOSE, CAPILLARY
Glucose-Capillary: 107 mg/dL — ABNORMAL HIGH (ref 70–99)
Glucose-Capillary: 115 mg/dL — ABNORMAL HIGH (ref 70–99)
Glucose-Capillary: 106 mg/dL — ABNORMAL HIGH (ref 70–99)

## 2022-08-06 LAB — PHOSPHORUS: Phosphorus: 2.7 mg/dL (ref 2.5–4.6)

## 2022-08-06 MED ORDER — ACETAMINOPHEN 10 MG/ML IV SOLN
1000.0000 mg | Freq: Four times a day (QID) | INTRAVENOUS | Status: AC
  Administered 2022-08-06 – 2022-08-07 (×4): 1000 mg via INTRAVENOUS
  Filled 2022-08-06 (×4): qty 100

## 2022-08-06 MED ORDER — VITAMIN C 500 MG PO TABS
1000.0000 mg | ORAL_TABLET | Freq: Every day | ORAL | Status: DC
  Administered 2022-08-09 – 2022-09-03 (×25): 1000 mg via ORAL
  Filled 2022-08-06 (×26): qty 2

## 2022-08-06 MED ORDER — IOHEXOL 9 MG/ML PO SOLN
500.0000 mL | ORAL | Status: AC
  Administered 2022-08-06 (×2): 500 mL via ORAL

## 2022-08-06 MED ORDER — IOHEXOL 350 MG/ML SOLN
75.0000 mL | Freq: Once | INTRAVENOUS | Status: AC | PRN
  Administered 2022-08-06: 75 mL via INTRAVENOUS

## 2022-08-06 MED ORDER — OXYCODONE HCL 5 MG PO TABS
10.0000 mg | ORAL_TABLET | ORAL | Status: DC | PRN
  Filled 2022-08-06: qty 3

## 2022-08-06 MED ORDER — MAGNESIUM SULFATE 2 GM/50ML IV SOLN
2.0000 g | Freq: Once | INTRAVENOUS | Status: AC
  Administered 2022-08-06: 2 g via INTRAVENOUS
  Filled 2022-08-06: qty 50

## 2022-08-06 MED ORDER — HYDROXYZINE HCL 50 MG/ML IM SOLN
50.0000 mg | Freq: Every day | INTRAMUSCULAR | Status: AC
  Administered 2022-08-06 – 2022-08-08 (×3): 50 mg via INTRAMUSCULAR
  Filled 2022-08-06 (×4): qty 1

## 2022-08-06 MED ORDER — TRAVASOL 10 % IV SOLN
INTRAVENOUS | Status: AC
  Filled 2022-08-06: qty 1101.6

## 2022-08-06 MED ORDER — METOPROLOL TARTRATE 5 MG/5ML IV SOLN
2.5000 mg | Freq: Four times a day (QID) | INTRAVENOUS | Status: DC
  Administered 2022-08-06 – 2022-08-09 (×12): 2.5 mg via INTRAVENOUS
  Filled 2022-08-06 (×12): qty 5

## 2022-08-06 MED ORDER — LORAZEPAM 2 MG/ML IJ SOLN
1.0000 mg | Freq: Two times a day (BID) | INTRAMUSCULAR | Status: DC | PRN
  Administered 2022-08-06 – 2022-08-30 (×22): 1 mg via INTRAVENOUS
  Filled 2022-08-06 (×22): qty 1

## 2022-08-06 MED ORDER — METHOCARBAMOL 1000 MG/10ML IJ SOLN
1000.0000 mg | Freq: Four times a day (QID) | INTRAVENOUS | Status: DC
  Administered 2022-08-06 – 2022-08-12 (×25): 1000 mg via INTRAVENOUS
  Filled 2022-08-06: qty 1000
  Filled 2022-08-06: qty 10
  Filled 2022-08-06: qty 1000
  Filled 2022-08-06: qty 10
  Filled 2022-08-06 (×3): qty 1000
  Filled 2022-08-06 (×2): qty 10
  Filled 2022-08-06: qty 1000
  Filled 2022-08-06: qty 10
  Filled 2022-08-06: qty 1000
  Filled 2022-08-06: qty 10
  Filled 2022-08-06 (×2): qty 1000
  Filled 2022-08-06: qty 10
  Filled 2022-08-06 (×2): qty 1000
  Filled 2022-08-06: qty 10
  Filled 2022-08-06: qty 1000
  Filled 2022-08-06: qty 10
  Filled 2022-08-06: qty 1000
  Filled 2022-08-06: qty 10
  Filled 2022-08-06 (×4): qty 1000
  Filled 2022-08-06: qty 10
  Filled 2022-08-06: qty 1000
  Filled 2022-08-06: qty 10
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
## 2022-08-06 NOTE — Progress Notes (Signed)
Inpatient Rehabilitation Admissions Coordinator   I met with patient at bedside. I continue to follow his case from a distance to await to pursue CIR admit.,  Ottie Glazier, RN, MSN Rehab Admissions Coordinator [phone] 08/06/2022 2:52 PM
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
## 2022-08-06 NOTE — Progress Notes (Signed)
EKG CRITICAL VALUE     12 lead EKG performed.  Critical value noted.  Maralyn Sago, RN notified.   Orma Flaming, 08/06/2022 12:43 PM
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
## 2022-08-06 NOTE — Progress Notes (Signed)
Trauma Event Note  Pt being seen by [facility] with psych while I rounded.  Fredna Dow, NP had ordered an ECG to assess QTc due to potential for new medications.  ECG suggested abnormality with potential ST elevation.  Pt's VS unchanged except HR has improved some (114 bpm) BP 124/72.  Pt does c/o some heartburn, however this is not a new symptom.  Dr. Bedelia Person was notified.  Cards consulted and electrolytes ordered at this time.  Last imported Vital Signs BP 123/76 (BP Location: Left Arm)   Pulse (!) 103   Temp 98.9 F (37.2 C) (Oral)   Resp 18   Ht 5\' 10"  (1.778 m)   Wt 157 lb 6.5 oz (71.4 kg)   SpO2 97%   BMI 22.59 kg/m   Trending CBC Recent Labs    08/04/22 0547 08/05/22 0500 08/06/22 0500  WBC 14.9* 18.3* 22.2*  HGB 7.5* 7.9* 8.3*  HCT 22.6* 24.0* 24.0*  PLT 804* 875* 826*    Trending Coag's No results for input(s): "APTT", "INR" in the last 72 hours.  Trending BMET Recent Labs    08/05/22 0500 08/06/22 0500 08/06/22 1419  NA 134* 134* 133*  K 3.6 4.1 4.4  CL 104 102 104  CO2 22 22 20*  BUN 30* 26* 23*  CREATININE 0.67 0.64 0.61  GLUCOSE 99 104* 126*    Leighton Luster W  Trauma Response RN  Please call TRN at [phone] for further assistance.

## 2022-08-07 ENCOUNTER — Inpatient Hospital Stay (HOSPITAL_COMMUNITY): Payer: Medicaid Other

## 2022-08-07 ENCOUNTER — Other Ambulatory Visit: Payer: Self-pay

## 2022-08-07 ENCOUNTER — Encounter (HOSPITAL_COMMUNITY): Payer: Self-pay

## 2022-08-07 LAB — BASIC METABOLIC PANEL
Anion gap: 10 (ref 5–15)
BUN: 22 mg/dL — ABNORMAL HIGH (ref 6–20)
CO2: 20 mmol/L — ABNORMAL LOW (ref 22–32)
Calcium: 8 mg/dL — ABNORMAL LOW (ref 8.9–10.3)
Chloride: 101 mmol/L (ref 98–111)
Creatinine, Ser: 0.58 mg/dL — ABNORMAL LOW (ref 0.61–1.24)
GFR, Estimated: >60 mL/min (ref >60)
Glucose, Bld: 143 mg/dL — ABNORMAL HIGH (ref 70–99)
Potassium: 4.5 mmol/L (ref 3.5–5.1)
Sodium: 131 mmol/L — ABNORMAL LOW (ref 135–145)

## 2022-08-07 LAB — CBC
HCT: 24.6 % — ABNORMAL LOW (ref 39.0–52.0)
Hemoglobin: 8.2 g/dL — ABNORMAL LOW (ref 13.0–17.0)
MCH: 29.1 pg (ref 26.0–34.0)
MCHC: 33.3 g/dL (ref 30.0–36.0)
MCV: 87.2 fL (ref 80.0–100.0)
Platelets: 841 10*3/uL — ABNORMAL HIGH (ref 150–400)
RBC: 2.82 MIL/uL — ABNORMAL LOW (ref 4.22–5.81)
RDW: 18.6 % — ABNORMAL HIGH (ref 11.5–15.5)
WBC: 15.3 10*3/uL — ABNORMAL HIGH (ref 4.0–10.5)
nRBC: 0.3 % — ABNORMAL HIGH (ref 0.0–0.2)

## 2022-08-07 LAB — PROTIME-INR
INR: 1.4 — ABNORMAL HIGH (ref 0.8–1.2)
Prothrombin Time: 16.5 seconds — ABNORMAL HIGH (ref 11.4–15.2)

## 2022-08-07 LAB — GLUCOSE, CAPILLARY
Glucose-Capillary: 88 mg/dL (ref 70–99)
Glucose-Capillary: 107 mg/dL — ABNORMAL HIGH (ref 70–99)

## 2022-08-07 LAB — MAGNESIUM: Magnesium: 2.2 mg/dL (ref 1.7–2.4)

## 2022-08-07 LAB — PHOSPHORUS: Phosphorus: 3.3 mg/dL (ref 2.5–4.6)

## 2022-08-07 MED ORDER — ACETAMINOPHEN 10 MG/ML IV SOLN
1000.0000 mg | Freq: Four times a day (QID) | INTRAVENOUS | Status: AC
  Administered 2022-08-07 – 2022-08-08 (×4): 1000 mg via INTRAVENOUS
  Filled 2022-08-07 (×4): qty 100

## 2022-08-07 MED ORDER — SODIUM CHLORIDE 0.9% FLUSH
5.0000 mL | Freq: Three times a day (TID) | INTRAVENOUS | Status: DC
  Administered 2022-08-07 – 2022-09-02 (×71): 5 mL

## 2022-08-07 MED ORDER — PIPERACILLIN-TAZOBACTAM 3.375 G IVPB
3.3750 g | Freq: Three times a day (TID) | INTRAVENOUS | Status: AC
  Administered 2022-08-07 – 2022-08-20 (×42): 3.375 g via INTRAVENOUS
  Filled 2022-08-07 (×42): qty 50

## 2022-08-07 MED ORDER — LIDOCAINE HCL (PF) 1 % IJ SOLN
10.0000 mL | Freq: Once | INTRAMUSCULAR | Status: AC
  Administered 2022-08-07: 10 mL via INTRADERMAL

## 2022-08-07 MED ORDER — LACTATED RINGERS IV BOLUS
1000.0000 mL | Freq: Once | INTRAVENOUS | Status: AC
  Administered 2022-08-07: 1000 mL via INTRAVENOUS

## 2022-08-07 MED ORDER — HYDROMORPHONE HCL 1 MG/ML IJ SOLN
INTRAMUSCULAR | Status: AC | PRN
  Administered 2022-08-07: 1 mg via INTRAVENOUS
  Administered 2022-08-07: .5 mg via INTRAVENOUS

## 2022-08-07 MED ORDER — METHYLPREDNISOLONE SODIUM SUCC 40 MG IJ SOLR: 5.0000 mg | Freq: Every day | INTRAMUSCULAR | Status: DC

## 2022-08-07 MED ORDER — LACTATED RINGERS IV SOLN: INTRAVENOUS | Status: DC

## 2022-08-07 MED ORDER — MIDAZOLAM HCL 2 MG/2ML IJ SOLN
INTRAMUSCULAR | Status: AC
  Filled 2022-08-07: qty 4

## 2022-08-07 MED ORDER — HYDROMORPHONE HCL 1 MG/ML IJ SOLN
1.0000 mg | INTRAMUSCULAR | Status: DC | PRN
  Administered 2022-08-07 – 2022-08-08 (×14): 1 mg via INTRAVENOUS
  Filled 2022-08-07 (×13): qty 1

## 2022-08-07 MED ORDER — TRAVASOL 10 % IV SOLN
INTRAVENOUS | Status: AC
  Filled 2022-08-07: qty 1101.6

## 2022-08-07 MED ORDER — SODIUM CHLORIDE 0.9% FLUSH
10.0000 mL | Freq: Two times a day (BID) | INTRAVENOUS | Status: DC
  Administered 2022-08-07 – 2022-08-10 (×5): 10 mL
  Administered 2022-08-10: 20 mL
  Administered 2022-08-11: 10 mL
  Administered 2022-08-11: 40 mL
  Administered 2022-08-12 – 2022-08-17 (×11): 10 mL
  Administered 2022-08-18: 30 mL
  Administered 2022-08-18 – 2022-08-22 (×8): 10 mL
  Administered 2022-08-22: 20 mL
  Administered 2022-08-23 – 2022-08-25 (×6): 10 mL
  Administered 2022-08-26: 40 mL
  Administered 2022-08-26 – 2022-08-27 (×2): 10 mL
  Administered 2022-08-27 – 2022-08-28 (×2): 20 mL
  Administered 2022-08-28 – 2022-08-29 (×3): 10 mL
  Administered 2022-08-30: 20 mL
  Administered 2022-08-30 – 2022-09-02 (×7): 10 mL

## 2022-08-07 MED ORDER — HYDROMORPHONE HCL 1 MG/ML IJ SOLN
INTRAMUSCULAR | Status: AC
  Filled 2022-08-07: qty 3

## 2022-08-07 MED ORDER — METHYLPREDNISOLONE SODIUM SUCC 40 MG IJ SOLR
10.0000 mg | Freq: Every day | INTRAMUSCULAR | Status: DC
  Administered 2022-08-07 – 2022-08-11 (×5): 10 mg via INTRAVENOUS
  Filled 2022-08-07 (×3): qty 1
  Filled 2022-08-07 (×2): qty 0.25

## 2022-08-07 MED ORDER — MIDAZOLAM HCL 2 MG/2ML IJ SOLN
INTRAMUSCULAR | Status: AC | PRN
  Administered 2022-08-07 (×3): .5 mg via INTRAVENOUS

## 2022-08-07 MED ORDER — FENTANYL CITRATE (PF) 100 MCG/2ML IJ SOLN
INTRAMUSCULAR | Status: AC
  Filled 2022-08-07: qty 2

## 2022-08-07 MED ORDER — SODIUM CHLORIDE 0.9% FLUSH: 10.0000 mL | INTRAVENOUS | Status: DC | PRN

## 2022-08-07 MED ORDER — FENTANYL CITRATE (PF) 100 MCG/2ML IJ SOLN
INTRAMUSCULAR | Status: AC | PRN
  Administered 2022-08-07 (×2): 50 ug via INTRAVENOUS

## 2022-08-07 NOTE — Progress Notes (Signed)
OT Cancellation Note  Patient Details Name: Shane Klein MRN: [id number] DOB: 20-Jan-1991   Cancelled Treatment:    Reason Eval/Treat Not Completed: Fatigue/lethargy limiting ability to participate. Pt seemed very sleepy and unable to keep eyes open when therapist speaking to him, unable to answer pain level question. OT will follow up next available time  Galen Manila 08/07/2022, 11:48 AM

## 2022-08-07 NOTE — Progress Notes (Signed)
   08/07/22 1247  Assess: MEWS Score  Temp (!) 102.2 F (39 C)  BP (!) 141/91  MAP (mmHg) 103  Pulse Rate (!) 125  ECG Heart Rate (!) 131  Resp 16  SpO2 93 %  O2 Device Room Air  Assess: MEWS Score  MEWS Temp 2  MEWS Systolic 0  MEWS Pulse 3  MEWS RR 0  MEWS LOC 0  MEWS Score 5  MEWS Score Color Red  Assess: if the MEWS score is Yellow or Red  Were vital signs taken at a resting state? Yes  Focused Assessment Change from prior assessment (see assessment flowsheet)  Does the patient meet 2 or more of the SIRS criteria? Yes  Does the patient have a confirmed or suspected source of infection? Yes  Provider and Rapid Response Notified? No  MEWS guidelines implemented  Yes, red  Treat  MEWS Interventions Considered administering scheduled or prn medications/treatments as ordered  Take Vital Signs  Increase Vital Sign Frequency  Red: Q1hr x2, continue Q4hrs until patient remains green for 12hrs  Escalate  MEWS: Escalate Red: Discuss with charge nurse and notify provider. Consider notifying RRT. If remains red for 2 hours consider need for higher level of care  Notify: Charge Nurse/RN  Name of Charge Nurse/RN Notified Prisilla  Provider Notification  Provider Name/Title White  Date Provider Notified 08/07/22  Time Provider Notified 1250  Method of Notification Page  Notification Reason Change in status  Provider response Evaluate remotely  Date of Provider Response 08/07/22  Time of Provider Response 1252  Notify: Rapid Response  Name of Rapid Response RN Notified Trauma  Date Rapid Response Notified 08/07/22  Time Rapid Response Notified 1320  Assess: SIRS CRITERIA  SIRS Temperature  1  SIRS Pulse 1  SIRS Respirations  0  SIRS WBC 1  SIRS Score Sum  3   Patient seen today by provider noted to have blue green drainage on midline incision, more frequent emesis, liquid stools. Not tolerating anything by mouth, ng placement ordered and after placement verified suction.   Noted to have increased pain, temp 102.2  and elevated heart rate, notified doctor, he ordered fluids and IV tylenol, then after IV tylenol 102.9. Sent to CT for new drain and then to return to ICU bed, patient's mom called and notified of room change.
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
## 2022-08-07 NOTE — Progress Notes (Signed)
Patient has temp of 102.1, HR in 120's. MD Kinsinger aware. No new orders.
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
## 2022-08-07 NOTE — Procedures (Signed)
Interventional Radiology Procedure Note  Procedure: Image guided drain placement:  Left upper quad approach, which crosses midline and terminates next to colon and the colonic defect/leak.  29F thal pigtail drain. Left upper quad approach, which terminates LUQ collection, 66F drain.   Both to suction  Complications: None  EBL: None Sample: Culture sent  Recommendations: - Routine drain care, with sterile flushes, record output - surgical drain should stay in the pelvic collection - follow up CX - routine wound care  Signed,  Yvone Neu. Loreta Ave, DO

## 2022-08-08 ENCOUNTER — Inpatient Hospital Stay (HOSPITAL_COMMUNITY): Payer: Medicaid Other

## 2022-08-08 LAB — BASIC METABOLIC PANEL
Anion gap: 10 (ref 5–15)
BUN: 20 mg/dL (ref 6–20)
CO2: 19 mmol/L — ABNORMAL LOW (ref 22–32)
Calcium: 8.3 mg/dL — ABNORMAL LOW (ref 8.9–10.3)
Chloride: 105 mmol/L (ref 98–111)
Creatinine, Ser: 0.6 mg/dL — ABNORMAL LOW (ref 0.61–1.24)
GFR, Estimated: >60 mL/min (ref >60)
Glucose, Bld: 110 mg/dL — ABNORMAL HIGH (ref 70–99)
Potassium: 4 mmol/L (ref 3.5–5.1)
Sodium: 134 mmol/L — ABNORMAL LOW (ref 135–145)

## 2022-08-08 LAB — GLUCOSE, CAPILLARY
Glucose-Capillary: 101 mg/dL — ABNORMAL HIGH (ref 70–99)
Glucose-Capillary: 102 mg/dL — ABNORMAL HIGH (ref 70–99)
Glucose-Capillary: 109 mg/dL — ABNORMAL HIGH (ref 70–99)
Glucose-Capillary: 113 mg/dL — ABNORMAL HIGH (ref 70–99)
Glucose-Capillary: 90 mg/dL (ref 70–99)
Glucose-Capillary: 92 mg/dL (ref 70–99)
Glucose-Capillary: 94 mg/dL (ref 70–99)

## 2022-08-08 LAB — PHOSPHORUS: Phosphorus: 3.5 mg/dL (ref 2.5–4.6)

## 2022-08-08 LAB — MAGNESIUM: Magnesium: 2 mg/dL (ref 1.7–2.4)

## 2022-08-08 LAB — AEROBIC/ANAEROBIC CULTURE W GRAM STAIN (SURGICAL/DEEP WOUND)

## 2022-08-08 MED ORDER — ONDANSETRON HCL 4 MG/2ML IJ SOLN: 4.0000 mg | Freq: Four times a day (QID) | INTRAMUSCULAR | Status: DC | PRN

## 2022-08-08 MED ORDER — DIPHENHYDRAMINE HCL 50 MG/ML IJ SOLN: 12.5000 mg | Freq: Four times a day (QID) | INTRAMUSCULAR | Status: DC | PRN

## 2022-08-08 MED ORDER — HYDROMORPHONE 1 MG/ML IV SOLN: INTRAVENOUS | Status: DC

## 2022-08-08 MED ORDER — TRAVASOL 10 % IV SOLN
INTRAVENOUS | Status: AC
  Filled 2022-08-08: qty 1101.6

## 2022-08-08 MED ORDER — HYDROMORPHONE 1 MG/ML IV SOLN
INTRAVENOUS | Status: DC
  Filled 2022-08-08: qty 30

## 2022-08-08 MED ORDER — HYDROMORPHONE HCL 1 MG/ML IJ SOLN
1.0000 mg | INTRAMUSCULAR | Status: DC | PRN
  Administered 2022-08-08 – 2022-08-15 (×52): 1 mg via INTRAVENOUS
  Filled 2022-08-08 (×52): qty 1

## 2022-08-08 MED ORDER — ACETAMINOPHEN 10 MG/ML IV SOLN
1000.0000 mg | Freq: Four times a day (QID) | INTRAVENOUS | Status: AC
  Administered 2022-08-08 – 2022-08-09 (×4): 1000 mg via INTRAVENOUS
  Filled 2022-08-08 (×4): qty 100

## 2022-08-08 MED ORDER — DIPHENHYDRAMINE HCL 12.5 MG/5ML PO ELIX: 12.5000 mg | ORAL_SOLUTION | Freq: Four times a day (QID) | ORAL | Status: DC | PRN

## 2022-08-08 MED ORDER — HYDROMORPHONE 1 MG/ML IV SOLN
INTRAVENOUS | Status: DC
  Administered 2022-08-08: 30 mg via INTRAVENOUS

## 2022-08-08 MED ORDER — NALOXONE HCL 0.4 MG/ML IJ SOLN: 0.4000 mg | INTRAMUSCULAR | Status: DC | PRN

## 2022-08-08 MED ORDER — SODIUM CHLORIDE 0.9% FLUSH: 9.0000 mL | INTRAVENOUS | Status: DC | PRN

## 2022-08-08 NOTE — Progress Notes (Signed)
While cleaning patient, RN noticed NG tube out of position with tape and securement intact.  NG reinserted and secured.  KUB ordered.
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
## 2022-08-09 ENCOUNTER — Inpatient Hospital Stay (HOSPITAL_COMMUNITY): Payer: Medicaid Other

## 2022-08-09 DIAGNOSIS — G4700 Insomnia, unspecified: Secondary | ICD-10-CM

## 2022-08-09 DIAGNOSIS — F4310 Post-traumatic stress disorder, unspecified: Secondary | ICD-10-CM | POA: Diagnosis not present

## 2022-08-09 DIAGNOSIS — F439 Reaction to severe stress, unspecified: Secondary | ICD-10-CM | POA: Diagnosis not present

## 2022-08-09 DIAGNOSIS — W3400XA Accidental discharge from unspecified firearms or gun, initial encounter: Secondary | ICD-10-CM | POA: Diagnosis not present

## 2022-08-09 LAB — MAGNESIUM: Magnesium: 1.9 mg/dL (ref 1.7–2.4)

## 2022-08-09 LAB — GLUCOSE, CAPILLARY
Glucose-Capillary: 101 mg/dL — ABNORMAL HIGH (ref 70–99)
Glucose-Capillary: 108 mg/dL — ABNORMAL HIGH (ref 70–99)
Glucose-Capillary: 113 mg/dL — ABNORMAL HIGH (ref 70–99)
Glucose-Capillary: 118 mg/dL — ABNORMAL HIGH (ref 70–99)
Glucose-Capillary: 141 mg/dL — ABNORMAL HIGH (ref 70–99)
Glucose-Capillary: 80 mg/dL (ref 70–99)

## 2022-08-09 LAB — AEROBIC/ANAEROBIC CULTURE W GRAM STAIN (SURGICAL/DEEP WOUND)

## 2022-08-09 LAB — COMPREHENSIVE METABOLIC PANEL
ALT: 19 U/L (ref 0–44)
AST: 17 U/L (ref 15–41)
Albumin: 1.8 g/dL — ABNORMAL LOW (ref 3.5–5.0)
Alkaline Phosphatase: 54 U/L (ref 38–126)
Anion gap: 9 (ref 5–15)
BUN: 23 mg/dL — ABNORMAL HIGH (ref 6–20)
CO2: 20 mmol/L — ABNORMAL LOW (ref 22–32)
Calcium: 8.4 mg/dL — ABNORMAL LOW (ref 8.9–10.3)
Chloride: 105 mmol/L (ref 98–111)
Creatinine, Ser: 0.66 mg/dL (ref 0.61–1.24)
GFR, Estimated: >60 mL/min (ref >60)
Glucose, Bld: 113 mg/dL — ABNORMAL HIGH (ref 70–99)
Potassium: 4 mmol/L (ref 3.5–5.1)
Sodium: 134 mmol/L — ABNORMAL LOW (ref 135–145)
Total Bilirubin: 0.9 mg/dL (ref 0.3–1.2)
Total Protein: 6.3 g/dL — ABNORMAL LOW (ref 6.5–8.1)

## 2022-08-09 LAB — PHOSPHORUS: Phosphorus: 3.5 mg/dL (ref 2.5–4.6)

## 2022-08-09 LAB — TRIGLYCERIDES: Triglycerides: 137 mg/dL (ref <150)

## 2022-08-09 LAB — CULTURE, BLOOD (ROUTINE X 2): Culture: NO GROWTH

## 2022-08-09 MED ORDER — METOPROLOL TARTRATE 5 MG/5ML IV SOLN
5.0000 mg | Freq: Four times a day (QID) | INTRAVENOUS | Status: DC
  Administered 2022-08-09 – 2022-08-14 (×19): 5 mg via INTRAVENOUS
  Filled 2022-08-09 (×18): qty 5

## 2022-08-09 MED ORDER — TRAVASOL 10 % IV SOLN
INTRAVENOUS | Status: AC
  Filled 2022-08-09: qty 1123.2

## 2022-08-09 MED ORDER — HYDROXYZINE HCL 25 MG PO TABS
25.0000 mg | ORAL_TABLET | Freq: Three times a day (TID) | ORAL | Status: DC | PRN
  Administered 2022-08-09 – 2022-09-01 (×23): 25 mg via ORAL
  Filled 2022-08-09 (×23): qty 1

## 2022-08-09 MED ORDER — TRAZODONE HCL 50 MG PO TABS
50.0000 mg | ORAL_TABLET | Freq: Every evening | ORAL | Status: DC | PRN
  Administered 2022-08-09 – 2022-08-10 (×3): 50 mg via ORAL
  Filled 2022-08-09 (×3): qty 1

## 2022-08-09 MED ORDER — OXYCODONE HCL 5 MG PO TABS
5.0000 mg | ORAL_TABLET | ORAL | Status: DC | PRN
  Administered 2022-08-09 – 2022-08-10 (×4): 10 mg via ORAL
  Filled 2022-08-09 (×4): qty 2

## 2022-08-09 MED ORDER — MAGNESIUM SULFATE IN D5W 1-5 GM/100ML-% IV SOLN
1.0000 g | Freq: Once | INTRAVENOUS | Status: AC
  Administered 2022-08-09: 1 g via INTRAVENOUS
  Filled 2022-08-09: qty 100
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
## 2022-08-09 NOTE — Progress Notes (Signed)
Inpatient Rehab Admissions Coordinator:   Continuing to follow this patient from a distance for progress and potential CIR admission. Patient is not medically ready, or able to tolerate 3 hours of therapy at this time.   Rehab Admissons Coordinator [place], [place], [place] [phone]
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
## 2022-08-10 ENCOUNTER — Inpatient Hospital Stay (HOSPITAL_COMMUNITY): Payer: Medicaid Other

## 2022-08-10 DIAGNOSIS — G4700 Insomnia, unspecified: Secondary | ICD-10-CM | POA: Diagnosis not present

## 2022-08-10 DIAGNOSIS — F4310 Post-traumatic stress disorder, unspecified: Secondary | ICD-10-CM | POA: Diagnosis not present

## 2022-08-10 DIAGNOSIS — W3400XA Accidental discharge from unspecified firearms or gun, initial encounter: Secondary | ICD-10-CM | POA: Diagnosis not present

## 2022-08-10 DIAGNOSIS — F439 Reaction to severe stress, unspecified: Secondary | ICD-10-CM | POA: Diagnosis not present

## 2022-08-10 LAB — GLUCOSE, CAPILLARY
Glucose-Capillary: 121 mg/dL — ABNORMAL HIGH (ref 70–99)
Glucose-Capillary: 107 mg/dL — ABNORMAL HIGH (ref 70–99)
Glucose-Capillary: 109 mg/dL — ABNORMAL HIGH (ref 70–99)
Glucose-Capillary: 93 mg/dL (ref 70–99)
Glucose-Capillary: 107 mg/dL — ABNORMAL HIGH (ref 70–99)
Glucose-Capillary: 110 mg/dL — ABNORMAL HIGH (ref 70–99)

## 2022-08-10 LAB — CULTURE, BLOOD (ROUTINE X 2)

## 2022-08-10 MED ORDER — TRAVASOL 10 % IV SOLN
INTRAVENOUS | Status: AC
  Filled 2022-08-10: qty 1123.2

## 2022-08-10 MED ORDER — OXYCODONE HCL 5 MG PO TABS
10.0000 mg | ORAL_TABLET | ORAL | Status: DC | PRN
  Administered 2022-08-10 – 2022-08-13 (×12): 15 mg via ORAL
  Administered 2022-08-14: 10 mg via ORAL
  Administered 2022-08-14 – 2022-08-15 (×5): 15 mg via ORAL
  Filled 2022-08-10 (×9): qty 3
  Filled 2022-08-10: qty 2
  Filled 2022-08-10 (×9): qty 3

## 2022-08-10 MED ORDER — ACETAMINOPHEN 500 MG PO TABS
1000.0000 mg | ORAL_TABLET | Freq: Four times a day (QID) | ORAL | Status: DC | PRN
  Administered 2022-08-10 – 2022-08-22 (×7): 1000 mg via ORAL
  Filled 2022-08-10 (×8): qty 2

## 2022-08-10 MED ORDER — BOOST / RESOURCE BREEZE PO LIQD CUSTOM
1.0000 | Freq: Three times a day (TID) | ORAL | Status: DC
  Administered 2022-08-10: 1 via ORAL

## 2022-08-10 NOTE — Progress Notes (Signed)
Inpatient Rehabilitation Admissions Coordinator   I continue to follow patient's case I await further progress with therapy before pursuing insurance approval for CIR.  Ottie Glazier, RN, MSN Rehab Admissions Coordinator [phone] 08/10/2022 11:48 AM
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
## 2022-08-11 DIAGNOSIS — F439 Reaction to severe stress, unspecified: Secondary | ICD-10-CM | POA: Diagnosis not present

## 2022-08-11 DIAGNOSIS — W3400XA Accidental discharge from unspecified firearms or gun, initial encounter: Secondary | ICD-10-CM | POA: Diagnosis not present

## 2022-08-11 DIAGNOSIS — G4700 Insomnia, unspecified: Secondary | ICD-10-CM | POA: Diagnosis not present

## 2022-08-11 DIAGNOSIS — F4310 Post-traumatic stress disorder, unspecified: Secondary | ICD-10-CM | POA: Diagnosis not present

## 2022-08-11 LAB — BASIC METABOLIC PANEL
Anion gap: 10 (ref 5–15)
BUN: 17 mg/dL (ref 6–20)
CO2: 26 mmol/L (ref 22–32)
Calcium: 8.2 mg/dL — ABNORMAL LOW (ref 8.9–10.3)
Chloride: 93 mmol/L — ABNORMAL LOW (ref 98–111)
Creatinine, Ser: 0.59 mg/dL — ABNORMAL LOW (ref 0.61–1.24)
GFR, Estimated: >60 mL/min (ref >60)
Glucose, Bld: 93 mg/dL (ref 70–99)
Potassium: 3.9 mmol/L (ref 3.5–5.1)
Sodium: 129 mmol/L — ABNORMAL LOW (ref 135–145)

## 2022-08-11 LAB — CBC
HCT: 22.9 % — ABNORMAL LOW (ref 39.0–52.0)
Hemoglobin: 7.5 g/dL — ABNORMAL LOW (ref 13.0–17.0)
MCH: 30.2 pg (ref 26.0–34.0)
MCHC: 32.8 g/dL (ref 30.0–36.0)
MCV: 92.3 fL (ref 80.0–100.0)
Platelets: 773 10*3/uL — ABNORMAL HIGH (ref 150–400)
RBC: 2.48 MIL/uL — ABNORMAL LOW (ref 4.22–5.81)
RDW: 18.6 % — ABNORMAL HIGH (ref 11.5–15.5)
WBC: 15.9 10*3/uL — ABNORMAL HIGH (ref 4.0–10.5)
nRBC: 0.1 % (ref 0.0–0.2)

## 2022-08-11 LAB — GLUCOSE, CAPILLARY
Glucose-Capillary: 130 mg/dL — ABNORMAL HIGH (ref 70–99)
Glucose-Capillary: 106 mg/dL — ABNORMAL HIGH (ref 70–99)
Glucose-Capillary: 121 mg/dL — ABNORMAL HIGH (ref 70–99)

## 2022-08-11 LAB — MAGNESIUM: Magnesium: 2 mg/dL (ref 1.7–2.4)

## 2022-08-11 LAB — PHOSPHORUS: Phosphorus: 3.7 mg/dL (ref 2.5–4.6)

## 2022-08-11 LAB — CULTURE, BLOOD (ROUTINE X 2): Culture: NO GROWTH

## 2022-08-11 MED ORDER — PREDNISONE 5 MG PO TABS
10.0000 mg | ORAL_TABLET | Freq: Every day | ORAL | Status: AC
  Administered 2022-08-12: 10 mg via ORAL
  Filled 2022-08-11: qty 2

## 2022-08-11 MED ORDER — TRAZODONE HCL 100 MG PO TABS
100.0000 mg | ORAL_TABLET | Freq: Every day | ORAL | Status: DC
  Administered 2022-08-11 – 2022-08-16 (×6): 100 mg via ORAL
  Filled 2022-08-11 (×6): qty 1

## 2022-08-11 MED ORDER — TRAVASOL 10 % IV SOLN
INTRAVENOUS | Status: AC
  Filled 2022-08-11: qty 1117.2

## 2022-08-11 MED ORDER — PREDNISONE 5 MG PO TABS
5.0000 mg | ORAL_TABLET | Freq: Every day | ORAL | Status: DC
  Administered 2022-08-14: 5 mg via ORAL
  Filled 2022-08-11: qty 1
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
## 2022-08-11 NOTE — Progress Notes (Signed)
Pt transferred to 4NP07.
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
## 2022-08-11 NOTE — Progress Notes (Signed)
Patient expressed concerns to RN regarding being lactose intolerant and the Boost/Resource Breeze containing milk products.   RN reviewed [employer] and it does state "milk ingredients" on the label.   RN reached out to dietician to discuss feeding supplement options for the patient.  Billey Co, RD, advised a member of the dietician team met with the patient yesterday and explained that Boost Breeze does have milk protein but no milk sugar and reviewed the ingredients with the patient..  Per RD, Lactose intolerance is an intolerance to milk sugar.    RN spoke with patient regarding the above information.  Patient stated he did recall this conversation however he advised he has drank these before and they made him "sick to his stomach".   Patient stated he is not able to drink this supplement.    RN notified RD and discontinued dietary supplement order due to patient inability to tolerate this supplement.

## 2022-08-12 ENCOUNTER — Inpatient Hospital Stay (HOSPITAL_COMMUNITY): Payer: Medicaid Other

## 2022-08-12 ENCOUNTER — Encounter (HOSPITAL_COMMUNITY): Payer: Self-pay

## 2022-08-12 LAB — COMPREHENSIVE METABOLIC PANEL
ALT: 24 U/L (ref 0–44)
AST: 21 U/L (ref 15–41)
Albumin: 1.9 g/dL — ABNORMAL LOW (ref 3.5–5.0)
Alkaline Phosphatase: 65 U/L (ref 38–126)
Anion gap: 10 (ref 5–15)
BUN: 17 mg/dL (ref 6–20)
CO2: 27 mmol/L (ref 22–32)
Calcium: 8.3 mg/dL — ABNORMAL LOW (ref 8.9–10.3)
Chloride: 94 mmol/L — ABNORMAL LOW (ref 98–111)
Creatinine, Ser: 0.53 mg/dL — ABNORMAL LOW (ref 0.61–1.24)
GFR, Estimated: >60 mL/min (ref >60)
Glucose, Bld: 106 mg/dL — ABNORMAL HIGH (ref 70–99)
Potassium: 3.8 mmol/L (ref 3.5–5.1)
Sodium: 131 mmol/L — ABNORMAL LOW (ref 135–145)
Total Bilirubin: 0.6 mg/dL (ref 0.3–1.2)
Total Protein: 6.6 g/dL (ref 6.5–8.1)

## 2022-08-12 LAB — GLUCOSE, CAPILLARY
Glucose-Capillary: 112 mg/dL — ABNORMAL HIGH (ref 70–99)
Glucose-Capillary: 125 mg/dL — ABNORMAL HIGH (ref 70–99)
Glucose-Capillary: 112 mg/dL — ABNORMAL HIGH (ref 70–99)

## 2022-08-12 LAB — CULTURE, BLOOD (ROUTINE X 2)
Special Requests: ADEQUATE
Special Requests: ADEQUATE

## 2022-08-12 LAB — TRIGLYCERIDES: Triglycerides: 98 mg/dL (ref <150)

## 2022-08-12 LAB — MAGNESIUM: Magnesium: 2.1 mg/dL (ref 1.7–2.4)

## 2022-08-12 LAB — PHOSPHORUS: Phosphorus: 3.3 mg/dL (ref 2.5–4.6)

## 2022-08-12 MED ORDER — HEPARIN (PORCINE) 25000 UT/250ML-% IV SOLN
1400.0000 [IU]/h | INTRAVENOUS | Status: DC
  Administered 2022-08-12: 1150 [IU]/h via INTRAVENOUS
  Filled 2022-08-12: qty 250

## 2022-08-12 MED ORDER — TRAVASOL 10 % IV SOLN
INTRAVENOUS | Status: AC
  Filled 2022-08-12: qty 1117.2

## 2022-08-12 MED ORDER — METHOCARBAMOL 500 MG PO TABS
1000.0000 mg | ORAL_TABLET | Freq: Four times a day (QID) | ORAL | Status: DC
  Administered 2022-08-12 – 2022-09-03 (×83): 1000 mg via ORAL
  Filled 2022-08-12 (×86): qty 2

## 2022-08-12 MED ORDER — IOHEXOL 350 MG/ML SOLN
75.0000 mL | Freq: Once | INTRAVENOUS | Status: AC | PRN
  Administered 2022-08-12: 75 mL via INTRAVENOUS

## 2022-08-12 MED ORDER — HEPARIN BOLUS VIA INFUSION
2100.0000 [IU] | Freq: Once | INTRAVENOUS | Status: AC
  Administered 2022-08-12: 2100 [IU] via INTRAVENOUS
  Filled 2022-08-12: qty 2100

## 2022-08-12 MED ORDER — POTASSIUM CHLORIDE CRYS ER 20 MEQ PO TBCR
40.0000 meq | EXTENDED_RELEASE_TABLET | Freq: Once | ORAL | Status: AC
  Administered 2022-08-12: 40 meq via ORAL
  Filled 2022-08-12: qty 2
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
## 2022-08-12 NOTE — Progress Notes (Signed)
CT revealed small linear thrombus seen in right brachiocephalic vein extending into SVC measuring 14 mm.  Dr. Sheliah Hatch advised, will apply warm compresses per Dr. Guerry Minors orders.

## 2022-08-12 NOTE — Progress Notes (Signed)
PT Cancellation Note  Patient Details Name: Shane Klein MRN: [id number] DOB: 14-May-1990   Cancelled Treatment:    Reason Eval/Treat Not Completed: Patient at procedure or test/unavailable  Patient leaving room with transporter. Will reattempt this pm as schedule allows.    Jerolyn Center, PT Acute Rehabilitation Services  Office [phone]   Zena Amos 08/12/2022, 1:28 PM
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
## 2022-08-12 NOTE — Progress Notes (Signed)
CT called to clarify CT orders pertaining to the contrast, this RN attempted to clarify with no success, they requested to inform/ speak with the MD on call.   Paged White, MD no response as of [phone]

## 2022-08-13 ENCOUNTER — Inpatient Hospital Stay (HOSPITAL_COMMUNITY): Payer: Medicaid Other

## 2022-08-13 DIAGNOSIS — F430 Acute stress reaction: Secondary | ICD-10-CM

## 2022-08-13 DIAGNOSIS — W3400XA Accidental discharge from unspecified firearms or gun, initial encounter: Secondary | ICD-10-CM | POA: Diagnosis not present

## 2022-08-13 LAB — CBC
HCT: 22.8 % — ABNORMAL LOW (ref 39.0–52.0)
Hemoglobin: 7.3 g/dL — ABNORMAL LOW (ref 13.0–17.0)
MCH: 27.3 pg (ref 26.0–34.0)
MCHC: 32 g/dL (ref 30.0–36.0)
MCV: 85.4 fL (ref 80.0–100.0)
Platelets: 859 10*3/uL — ABNORMAL HIGH (ref 150–400)
RBC: 2.67 MIL/uL — ABNORMAL LOW (ref 4.22–5.81)
RDW: 18 % — ABNORMAL HIGH (ref 11.5–15.5)
WBC: 10.9 10*3/uL — ABNORMAL HIGH (ref 4.0–10.5)
nRBC: 0.2 % (ref 0.0–0.2)

## 2022-08-13 LAB — BASIC METABOLIC PANEL
Anion gap: 9 (ref 5–15)
BUN: 16 mg/dL (ref 6–20)
CO2: 27 mmol/L (ref 22–32)
Calcium: 8.5 mg/dL — ABNORMAL LOW (ref 8.9–10.3)
Chloride: 94 mmol/L — ABNORMAL LOW (ref 98–111)
Creatinine, Ser: 0.54 mg/dL — ABNORMAL LOW (ref 0.61–1.24)
GFR, Estimated: >60 mL/min (ref >60)
Glucose, Bld: 103 mg/dL — ABNORMAL HIGH (ref 70–99)
Potassium: 4.4 mmol/L (ref 3.5–5.1)
Sodium: 130 mmol/L — ABNORMAL LOW (ref 135–145)

## 2022-08-13 LAB — HEPARIN LEVEL (UNFRACTIONATED)
Heparin Unfractionated: 0.13 IU/mL — ABNORMAL LOW (ref 0.30–0.70)
Heparin Unfractionated: 0.18 IU/mL — ABNORMAL LOW (ref 0.30–0.70)
Heparin Unfractionated: <0.1 IU/mL — ABNORMAL LOW (ref 0.30–0.70)
Heparin Unfractionated: <0.1 [IU]/mL — ABNORMAL LOW (ref 0.30–0.70)

## 2022-08-13 LAB — TRIGLYCERIDES: Triglycerides: 101 mg/dL (ref <150)

## 2022-08-13 LAB — GLUCOSE, CAPILLARY
Glucose-Capillary: 125 mg/dL — ABNORMAL HIGH (ref 70–99)
Glucose-Capillary: 113 mg/dL — ABNORMAL HIGH (ref 70–99)
Glucose-Capillary: 116 mg/dL — ABNORMAL HIGH (ref 70–99)

## 2022-08-13 MED ORDER — SODIUM CHLORIDE 0.9% FLUSH
5.0000 mL | Freq: Three times a day (TID) | INTRAVENOUS | Status: DC
  Administered 2022-08-13 – 2022-09-02 (×56): 5 mL

## 2022-08-13 MED ORDER — MIDAZOLAM HCL 2 MG/2ML IJ SOLN
INTRAMUSCULAR | Status: AC | PRN
  Administered 2022-08-13: .5 mg via INTRAVENOUS
  Administered 2022-08-13: 1 mg via INTRAVENOUS
  Administered 2022-08-13: .5 mg via INTRAVENOUS

## 2022-08-13 MED ORDER — MIDAZOLAM HCL 2 MG/2ML IJ SOLN
INTRAMUSCULAR | Status: AC
  Filled 2022-08-13: qty 4

## 2022-08-13 MED ORDER — TRAVASOL 10 % IV SOLN
INTRAVENOUS | Status: AC
  Filled 2022-08-13: qty 1117.2

## 2022-08-13 MED ORDER — PANTOPRAZOLE SODIUM 40 MG PO TBEC
40.0000 mg | DELAYED_RELEASE_TABLET | Freq: Two times a day (BID) | ORAL | Status: DC
  Administered 2022-08-13 – 2022-09-03 (×41): 40 mg via ORAL
  Filled 2022-08-13 (×41): qty 1

## 2022-08-13 MED ORDER — FENTANYL CITRATE (PF) 100 MCG/2ML IJ SOLN
INTRAMUSCULAR | Status: AC
  Filled 2022-08-13: qty 4

## 2022-08-13 MED ORDER — FENTANYL CITRATE (PF) 100 MCG/2ML IJ SOLN
INTRAMUSCULAR | Status: AC | PRN
  Administered 2022-08-13 (×4): 25 ug via INTRAVENOUS

## 2022-08-13 MED ORDER — HEPARIN (PORCINE) 25000 UT/250ML-% IV SOLN
1750.0000 [IU]/h | INTRAVENOUS | Status: DC
  Administered 2022-08-14: 1400 [IU]/h via INTRAVENOUS
  Administered 2022-08-14 – 2022-08-16 (×4): 1750 [IU]/h via INTRAVENOUS
  Filled 2022-08-13 (×5): qty 250

## 2022-08-13 NOTE — Progress Notes (Signed)
Inpatient Rehabilitation Admissions Coordinator   I continue to follow his progress at a distance as not medically ready to pursue CIR admit.  Ottie Glazier, RN, MSN Rehab Admissions Coordinator [phone] 08/13/2022 10:26 AM
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
## 2022-08-13 NOTE — Progress Notes (Signed)
ANTICOAGULATION CONSULT NOTE  Pharmacy Consult for heparin infusion  Indication: DVT Brief A/P: Heparin level subtherapeutic Increase Heparin rate  Allergies  Allergen Reactions   Lactose Intolerance (Gi)     Pt advised RN he is lactose intolerant.     Patient Measurements: Height:  (177.8 cm) Weight: 71.1 kg (156 lb 12 oz) IBW/kg (Calculated) : 73 Heparin Dosing Weight: 70.3 kg   Vital Signs: Temp: 98.8 F (37.1 C) (04/18 2321) Temp Source: Oral (04/18 2321) BP: 133/94 (04/18 2321) Pulse Rate: 97 (04/18 2321)  Labs: Recent Labs    08/11/22 0530 08/11/22 1233 08/12/22 0500 08/13/22 0121  HGB  --  7.5*  --   --   HCT  --  22.9*  --   --   PLT  --  773*  --   --   HEPARINUNFRC  --   --   --  0.13*  CREATININE 0.59*  --  0.53*  --      Estimated Creatinine Clearance: 134.5 mL/min (A) (by C-G formula based on SCr of 0.53 mg/dL (L)).  Assessment:32 y.o. male with SVC thrombus for heparin  Goal of Therapy:  Heparin level 0.3-0.7 units/ml Monitor platelets by anticoagulation protocol: Yes   Plan:  Increase Heparin 1400 units/hr Check heparin level in 8 hours.   Geannie Risen, PharmD, BCPS  08/13/2022 2:25 AM
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
## 2022-08-13 NOTE — Progress Notes (Signed)
Brief Nutrition Note  Consult received for 48-hour calorie count. Pt is NPO at this time for IR procedure scheduled for today. Pt previously on a gluten-free diet. TPN continues to infuse at goal rate of 95 ml/hr which meets 100% of pt's estimated kcal and protein needs.  Discussed calorie count with MD. Plan is to start calorie count once pt returns from IR procedure. Discussed pt with RN. IR procedure scheduled for between 2:00-3:00 pm today. RD has adjusted timing of calorie count order to start this evening at 5:00 pm with dinner meal. A calorie count envelope has been hung on pt's door. RN aware of plan.  Nursing staff to document percent consumed for each item on the patient's meal tray ticket and keep in envelope. Nursing staff to also document percent of any supplement, snack, or outside food pt consumes and keep documentation in envelope for RD to review.  RD team will follow up with results of 48-hour calorie count on Monday, 08/16/22.   Mertie Clause, MS, RD, LDN Inpatient Clinical Dietitian Please see AMiON for contact information.

## 2022-08-13 NOTE — Progress Notes (Signed)
ANTICOAGULATION CONSULT NOTE  Pharmacy Consult for heparin infusion  Indication: DVT Brief A/P: Heparin level subtherapeutic Increase Heparin rate  Allergies  Allergen Reactions   Lactose Intolerance (Gi)     Pt advised RN he is lactose intolerant.     Patient Measurements: Height:  (177.8 cm) Weight: 71.1 kg (156 lb 12 oz) IBW/kg (Calculated) : 73 Heparin Dosing Weight: 70.3 kg   Vital Signs: Temp: 98.6 F (37 C) (04/19 1146) Temp Source: Oral (04/19 1146) BP: 146/108 (04/19 1146) Pulse Rate: 126 (04/19 1146)  Labs: Recent Labs    08/11/22 0530 08/11/22 1233 08/12/22 0500 08/13/22 0121 08/13/22 0500 08/13/22 1140  HGB  --  7.5*  --   --  7.3*  --   HCT  --  22.9*  --   --  22.8*  --   PLT  --  773*  --   --  859*  --   HEPARINUNFRC  --   --   --  0.13* 0.18* <0.10*  CREATININE 0.59*  --  0.53*  --  0.54*  --      Estimated Creatinine Clearance: 134.5 mL/min (A) (by C-G formula based on SCr of 0.54 mg/dL (L)).  Assessment:31 y.o. male with SVC thrombus for heparin  Heparin level undetectable, but the drip had been held for approximately one hour for a procedure, prior to the blood draw.  Will not make changes based on this level.  Once procedure is complete will retime a new level.  Goal of Therapy:  Heparin level 0.3-0.7 units/ml Monitor platelets by anticoagulation protocol: Yes   Plan:  Heparin 1400 units/hr - on hold for procedure Check heparin level in 8 hours after it is restarted post procedure.   Jeanella Cara, PharmD, [facility] Clinical Pharmacist Please see AMION for all Pharmacists' Contact Phone Numbers 08/13/2022, 12:26 PM
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
## 2022-08-14 ENCOUNTER — Inpatient Hospital Stay (HOSPITAL_COMMUNITY): Payer: Medicaid Other

## 2022-08-14 LAB — GLUCOSE, CAPILLARY
Glucose-Capillary: 117 mg/dL — ABNORMAL HIGH (ref 70–99)
Glucose-Capillary: 113 mg/dL — ABNORMAL HIGH (ref 70–99)
Glucose-Capillary: 127 mg/dL — ABNORMAL HIGH (ref 70–99)

## 2022-08-14 LAB — BASIC METABOLIC PANEL WITH GFR
Anion gap: 6 (ref 5–15)
BUN: 16 mg/dL (ref 6–20)
CO2: 24 mmol/L (ref 22–32)
Calcium: 8.1 mg/dL — ABNORMAL LOW (ref 8.9–10.3)
Chloride: 96 mmol/L — ABNORMAL LOW (ref 98–111)
Creatinine, Ser: 0.56 mg/dL — ABNORMAL LOW (ref 0.61–1.24)
GFR, Estimated: >60 mL/min
Glucose, Bld: 101 mg/dL — ABNORMAL HIGH (ref 70–99)
Potassium: 4.4 mmol/L (ref 3.5–5.1)
Sodium: 126 mmol/L — ABNORMAL LOW (ref 135–145)

## 2022-08-14 LAB — HEPARIN LEVEL (UNFRACTIONATED)
Heparin Unfractionated: 0.18 IU/mL — ABNORMAL LOW (ref 0.30–0.70)
Heparin Unfractionated: <0.1 IU/mL — ABNORMAL LOW (ref 0.30–0.70)

## 2022-08-14 LAB — CBC
HCT: 23.2 % — ABNORMAL LOW (ref 39.0–52.0)
Hemoglobin: 7.5 g/dL — ABNORMAL LOW (ref 13.0–17.0)
MCH: 28 pg (ref 26.0–34.0)
MCHC: 32.3 g/dL (ref 30.0–36.0)
MCV: 86.6 fL (ref 80.0–100.0)
Platelets: 758 10*3/uL — ABNORMAL HIGH (ref 150–400)
RBC: 2.68 MIL/uL — ABNORMAL LOW (ref 4.22–5.81)
RDW: 18.6 % — ABNORMAL HIGH (ref 11.5–15.5)
WBC: 10.5 10*3/uL (ref 4.0–10.5)
nRBC: 0.2 % (ref 0.0–0.2)

## 2022-08-14 LAB — PHOSPHORUS: Phosphorus: 3.6 mg/dL (ref 2.5–4.6)

## 2022-08-14 LAB — MAGNESIUM: Magnesium: 2 mg/dL (ref 1.7–2.4)

## 2022-08-14 MED ORDER — HEPARIN BOLUS VIA INFUSION
1000.0000 [IU] | Freq: Once | INTRAVENOUS | Status: AC
  Administered 2022-08-14: 1000 [IU] via INTRAVENOUS
  Filled 2022-08-14: qty 1000

## 2022-08-14 MED ORDER — METOPROLOL TARTRATE 25 MG PO TABS
25.0000 mg | ORAL_TABLET | Freq: Two times a day (BID) | ORAL | Status: DC
  Administered 2022-08-14 – 2022-09-03 (×41): 25 mg via ORAL
  Filled 2022-08-14 (×41): qty 1

## 2022-08-14 MED ORDER — TRAVASOL 10 % IV SOLN
INTRAVENOUS | Status: AC
  Filled 2022-08-14: qty 1117.2

## 2022-08-14 MED ORDER — SODIUM CHLORIDE 1 G PO TABS
2.0000 g | ORAL_TABLET | Freq: Three times a day (TID) | ORAL | Status: DC
  Administered 2022-08-14 – 2022-09-03 (×57): 2 g via ORAL
  Filled 2022-08-14 (×58): qty 2
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
## 2022-08-15 LAB — HEPARIN LEVEL (UNFRACTIONATED)
Heparin Unfractionated: 0.48 IU/mL (ref 0.30–0.70)
Heparin Unfractionated: 0.41 IU/mL (ref 0.30–0.70)
Heparin Unfractionated: 0.26 IU/mL — ABNORMAL LOW (ref 0.30–0.70)
Heparin Unfractionated: 0.48 IU/mL (ref 0.30–0.70)

## 2022-08-15 LAB — GLUCOSE, CAPILLARY: Glucose-Capillary: 104 mg/dL — ABNORMAL HIGH (ref 70–99)

## 2022-08-15 LAB — BASIC METABOLIC PANEL
Anion gap: 6 (ref 5–15)
BUN: 13 mg/dL (ref 6–20)
CO2: 26 mmol/L (ref 22–32)
Calcium: 8.5 mg/dL — ABNORMAL LOW (ref 8.9–10.3)
Chloride: 99 mmol/L (ref 98–111)
Creatinine, Ser: 0.51 mg/dL — ABNORMAL LOW (ref 0.61–1.24)
GFR, Estimated: >60 mL/min (ref >60)
Glucose, Bld: 95 mg/dL (ref 70–99)
Potassium: 4.4 mmol/L (ref 3.5–5.1)
Sodium: 131 mmol/L — ABNORMAL LOW (ref 135–145)

## 2022-08-15 LAB — CBC
HCT: 21.9 % — ABNORMAL LOW (ref 39.0–52.0)
Hemoglobin: 7.2 g/dL — ABNORMAL LOW (ref 13.0–17.0)
MCH: 28.3 pg (ref 26.0–34.0)
MCHC: 32.9 g/dL (ref 30.0–36.0)
MCV: 86.2 fL (ref 80.0–100.0)
Platelets: 696 10*3/uL — ABNORMAL HIGH (ref 150–400)
RBC: 2.54 MIL/uL — ABNORMAL LOW (ref 4.22–5.81)
RDW: 18.6 % — ABNORMAL HIGH (ref 11.5–15.5)
WBC: 8.7 10*3/uL (ref 4.0–10.5)
nRBC: 0.2 % (ref 0.0–0.2)

## 2022-08-15 LAB — AEROBIC/ANAEROBIC CULTURE W GRAM STAIN (SURGICAL/DEEP WOUND)

## 2022-08-15 MED ORDER — OXYCODONE HCL 5 MG PO TABS
10.0000 mg | ORAL_TABLET | ORAL | Status: DC | PRN
  Administered 2022-08-15: 15 mg via ORAL
  Administered 2022-08-15: 20 mg via ORAL
  Administered 2022-08-16: 15 mg via ORAL
  Filled 2022-08-15: qty 4
  Filled 2022-08-15 (×2): qty 3

## 2022-08-15 MED ORDER — KETOROLAC TROMETHAMINE 30 MG/ML IJ SOLN
30.0000 mg | Freq: Four times a day (QID) | INTRAMUSCULAR | Status: AC
  Administered 2022-08-15 – 2022-08-16 (×4): 30 mg via INTRAVENOUS
  Filled 2022-08-15 (×4): qty 1

## 2022-08-15 MED ORDER — LOPERAMIDE HCL 2 MG PO CAPS
2.0000 mg | ORAL_CAPSULE | Freq: Three times a day (TID) | ORAL | Status: DC
  Administered 2022-08-15 – 2022-08-16 (×4): 2 mg via ORAL
  Filled 2022-08-15 (×4): qty 1

## 2022-08-15 MED ORDER — HYDROMORPHONE HCL 1 MG/ML IJ SOLN
0.5000 mg | INTRAMUSCULAR | Status: DC | PRN
  Administered 2022-08-15 (×3): 1 mg via INTRAVENOUS
  Administered 2022-08-15: 2 mg via INTRAVENOUS
  Administered 2022-08-15 – 2022-08-16 (×2): 1 mg via INTRAVENOUS
  Administered 2022-08-16: 2 mg via INTRAVENOUS
  Filled 2022-08-15: qty 1
  Filled 2022-08-15 (×2): qty 2
  Filled 2022-08-15 (×4): qty 1

## 2022-08-15 MED ORDER — GABAPENTIN 100 MG PO CAPS
200.0000 mg | ORAL_CAPSULE | Freq: Three times a day (TID) | ORAL | Status: DC
  Administered 2022-08-15 – 2022-08-16 (×4): 200 mg via ORAL
  Filled 2022-08-15 (×4): qty 2

## 2022-08-15 MED ORDER — TRAVASOL 10 % IV SOLN
INTRAVENOUS | Status: AC
  Filled 2022-08-15: qty 1117.2
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
## 2022-08-16 DIAGNOSIS — F430 Acute stress reaction: Secondary | ICD-10-CM | POA: Diagnosis not present

## 2022-08-16 DIAGNOSIS — W3400XA Accidental discharge from unspecified firearms or gun, initial encounter: Secondary | ICD-10-CM | POA: Diagnosis not present

## 2022-08-16 LAB — COMPREHENSIVE METABOLIC PANEL
ALT: 28 U/L (ref 0–44)
AST: 17 U/L (ref 15–41)
Albumin: 1.7 g/dL — ABNORMAL LOW (ref 3.5–5.0)
Alkaline Phosphatase: 82 U/L (ref 38–126)
Anion gap: 8 (ref 5–15)
BUN: 18 mg/dL (ref 6–20)
CO2: 26 mmol/L (ref 22–32)
Calcium: 8.6 mg/dL — ABNORMAL LOW (ref 8.9–10.3)
Chloride: 99 mmol/L (ref 98–111)
Creatinine, Ser: 0.61 mg/dL (ref 0.61–1.24)
GFR, Estimated: >60 mL/min (ref >60)
Glucose, Bld: 87 mg/dL (ref 70–99)
Potassium: 5.2 mmol/L — ABNORMAL HIGH (ref 3.5–5.1)
Sodium: 133 mmol/L — ABNORMAL LOW (ref 135–145)
Total Bilirubin: 0.6 mg/dL (ref 0.3–1.2)
Total Protein: 6 g/dL — ABNORMAL LOW (ref 6.5–8.1)

## 2022-08-16 LAB — CBC
HCT: 22.1 % — ABNORMAL LOW (ref 39.0–52.0)
HCT: 26.1 % — ABNORMAL LOW (ref 39.0–52.0)
Hemoglobin: 7 g/dL — ABNORMAL LOW (ref 13.0–17.0)
Hemoglobin: 8.6 g/dL — ABNORMAL LOW (ref 13.0–17.0)
MCH: 27.8 pg (ref 26.0–34.0)
MCH: 30.2 pg (ref 26.0–34.0)
MCHC: 31.7 g/dL (ref 30.0–36.0)
MCHC: 33 g/dL (ref 30.0–36.0)
MCV: 87.7 fL (ref 80.0–100.0)
MCV: 91.6 fL (ref 80.0–100.0)
Platelets: 681 10*3/uL — ABNORMAL HIGH (ref 150–400)
Platelets: 761 10*3/uL — ABNORMAL HIGH (ref 150–400)
RBC: 2.52 MIL/uL — ABNORMAL LOW (ref 4.22–5.81)
RBC: 2.85 MIL/uL — ABNORMAL LOW (ref 4.22–5.81)
RDW: 18.6 % — ABNORMAL HIGH (ref 11.5–15.5)
RDW: 19.8 % — ABNORMAL HIGH (ref 11.5–15.5)
WBC: 9.9 10*3/uL (ref 4.0–10.5)
WBC: 11 10*3/uL — ABNORMAL HIGH (ref 4.0–10.5)
nRBC: 0 % (ref 0.0–0.2)
nRBC: 0 % (ref 0.0–0.2)

## 2022-08-16 LAB — BASIC METABOLIC PANEL
Anion gap: 6 (ref 5–15)
Anion gap: 10 (ref 5–15)
BUN: 19 mg/dL (ref 6–20)
BUN: 17 mg/dL (ref 6–20)
CO2: 25 mmol/L (ref 22–32)
CO2: 21 mmol/L — ABNORMAL LOW (ref 22–32)
Calcium: 8.3 mg/dL — ABNORMAL LOW (ref 8.9–10.3)
Calcium: 8.5 mg/dL — ABNORMAL LOW (ref 8.9–10.3)
Chloride: 102 mmol/L (ref 98–111)
Chloride: 98 mmol/L (ref 98–111)
Creatinine, Ser: 0.59 mg/dL — ABNORMAL LOW (ref 0.61–1.24)
Creatinine, Ser: 0.67 mg/dL (ref 0.61–1.24)
GFR, Estimated: >60 mL/min (ref >60)
GFR, Estimated: >60 mL/min (ref >60)
Glucose, Bld: 107 mg/dL — ABNORMAL HIGH (ref 70–99)
Glucose, Bld: 122 mg/dL — ABNORMAL HIGH (ref 70–99)
Potassium: 5.2 mmol/L — ABNORMAL HIGH (ref 3.5–5.1)
Potassium: 4.8 mmol/L (ref 3.5–5.1)
Sodium: 133 mmol/L — ABNORMAL LOW (ref 135–145)
Sodium: 129 mmol/L — ABNORMAL LOW (ref 135–145)

## 2022-08-16 LAB — GLUCOSE, CAPILLARY
Glucose-Capillary: 104 mg/dL — ABNORMAL HIGH (ref 70–99)
Glucose-Capillary: 100 mg/dL — ABNORMAL HIGH (ref 70–99)
Glucose-Capillary: 87 mg/dL (ref 70–99)

## 2022-08-16 LAB — HEPARIN LEVEL (UNFRACTIONATED)
Heparin Unfractionated: <0.1 IU/mL — ABNORMAL LOW (ref 0.30–0.70)
Heparin Unfractionated: 0.6 IU/mL (ref 0.30–0.70)

## 2022-08-16 LAB — AEROBIC/ANAEROBIC CULTURE W GRAM STAIN (SURGICAL/DEEP WOUND)

## 2022-08-16 LAB — PREPARE RBC (CROSSMATCH)

## 2022-08-16 LAB — PHOSPHORUS: Phosphorus: 3.9 mg/dL (ref 2.5–4.6)

## 2022-08-16 LAB — TRIGLYCERIDES: Triglycerides: 73 mg/dL (ref <150)

## 2022-08-16 LAB — MAGNESIUM: Magnesium: 2 mg/dL (ref 1.7–2.4)

## 2022-08-16 LAB — BPAM RBC
ISSUE DATE / TIME: 202404221455
Unit Type and Rh: 6200

## 2022-08-16 LAB — TYPE AND SCREEN

## 2022-08-16 MED ORDER — OXYCODONE HCL 5 MG PO TABS
10.0000 mg | ORAL_TABLET | ORAL | Status: DC | PRN
  Administered 2022-08-16 – 2022-08-18 (×6): 20 mg via ORAL
  Administered 2022-08-18 (×2): 10 mg via ORAL
  Administered 2022-08-19 – 2022-08-20 (×4): 20 mg via ORAL
  Administered 2022-08-21 (×3): 15 mg via ORAL
  Administered 2022-08-22: 10 mg via ORAL
  Administered 2022-08-22 (×2): 15 mg via ORAL
  Administered 2022-08-23 – 2022-08-25 (×9): 20 mg via ORAL
  Administered 2022-08-25 – 2022-08-26 (×4): 10 mg via ORAL
  Administered 2022-08-26 – 2022-09-01 (×14): 20 mg via ORAL
  Filled 2022-08-16 (×3): qty 4
  Filled 2022-08-16 (×2): qty 2
  Filled 2022-08-16: qty 3
  Filled 2022-08-16: qty 4
  Filled 2022-08-16: qty 3
  Filled 2022-08-16 (×7): qty 4
  Filled 2022-08-16: qty 3
  Filled 2022-08-16: qty 4
  Filled 2022-08-16: qty 2
  Filled 2022-08-16 (×3): qty 4
  Filled 2022-08-16: qty 3
  Filled 2022-08-16 (×2): qty 4
  Filled 2022-08-16: qty 2
  Filled 2022-08-16: qty 4
  Filled 2022-08-16 (×2): qty 2
  Filled 2022-08-16: qty 3
  Filled 2022-08-16 (×7): qty 4
  Filled 2022-08-16 (×2): qty 3
  Filled 2022-08-16 (×8): qty 4

## 2022-08-16 MED ORDER — LOPERAMIDE HCL 2 MG PO CAPS
4.0000 mg | ORAL_CAPSULE | Freq: Three times a day (TID) | ORAL | Status: DC
  Administered 2022-08-16 (×2): 4 mg via ORAL
  Filled 2022-08-16 (×2): qty 2

## 2022-08-16 MED ORDER — HYDROMORPHONE HCL 1 MG/ML IJ SOLN
0.5000 mg | INTRAMUSCULAR | Status: DC | PRN
  Administered 2022-08-16 – 2022-08-20 (×35): 1 mg via INTRAVENOUS
  Filled 2022-08-16 (×35): qty 1

## 2022-08-16 MED ORDER — HEPARIN (PORCINE) 25000 UT/250ML-% IV SOLN
1750.0000 [IU]/h | INTRAVENOUS | Status: AC
  Administered 2022-08-16: 1750 [IU]/h via INTRAVENOUS

## 2022-08-16 MED ORDER — CALCIUM POLYCARBOPHIL 625 MG PO TABS
625.0000 mg | ORAL_TABLET | Freq: Every day | ORAL | Status: DC
  Administered 2022-08-16 – 2022-08-17 (×2): 625 mg via ORAL
  Filled 2022-08-16 (×2): qty 1

## 2022-08-16 MED ORDER — SODIUM CHLORIDE 0.9% IV SOLUTION: Freq: Once | INTRAVENOUS | Status: AC

## 2022-08-16 MED ORDER — GABAPENTIN 300 MG PO CAPS
300.0000 mg | ORAL_CAPSULE | Freq: Three times a day (TID) | ORAL | Status: DC
  Administered 2022-08-16 – 2022-08-25 (×27): 300 mg via ORAL
  Filled 2022-08-16 (×28): qty 1

## 2022-08-16 MED ORDER — LACTATED RINGERS IV BOLUS
500.0000 mL | Freq: Once | INTRAVENOUS | Status: AC
  Administered 2022-08-16: 500 mL via INTRAVENOUS

## 2022-08-16 MED ORDER — TRAVASOL 10 % IV SOLN
INTRAVENOUS | Status: AC
  Filled 2022-08-16: qty 1117.2

## 2022-08-16 MED ORDER — HEPARIN (PORCINE) 25000 UT/250ML-% IV SOLN: 1750.0000 [IU]/h | INTRAVENOUS | Status: DC

## 2022-08-16 MED ORDER — SODIUM ZIRCONIUM CYCLOSILICATE 10 G PO PACK
10.0000 g | PACK | Freq: Once | ORAL | Status: AC
  Administered 2022-08-16: 10 g via ORAL
  Filled 2022-08-16: qty 1
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
## 2022-08-16 NOTE — Progress Notes (Signed)
Inpatient Rehab Admissions Coordinator:   CIR continues to follow at a distance, though Pt. Is not medically ready for d/c at this time.   Megan Salon, MS, CCC-SLP Rehab Admissions Coordinator  [phone] (celll) [phone] (office)

## 2022-08-16 NOTE — Progress Notes (Signed)
MEWS Progress Note  Patient Details Name: Shane Klein MRN: [id number] DOB: 1991-04-11 Today's Date: 08/16/2022   MEWS Flowsheet Documentation:  Assess: MEWS Score Temp: (!) 100.5 F (38.1 C) BP: 132/87 MAP (mmHg): 92 Pulse Rate: (!) 136 ECG Heart Rate: (!) 135 Resp: (!) 23 Level of Consciousness: Alert SpO2: 98 % O2 Device: Room Air Patient Activity (if Appropriate): In bed O2 Flow Rate (L/min): 2 L/min FiO2 (%): (!) 0 % Assess: MEWS Score MEWS Temp: 1 MEWS Systolic: 0 MEWS Pulse: 3 MEWS RR: 1 MEWS LOC: 0 MEWS Score: 5 MEWS Score Color: Red Assess: SIRS CRITERIA SIRS Temperature : 0 SIRS Respirations : 1 SIRS Pulse: 1 SIRS WBC: 0 SIRS Score Sum : 2 SIRS Temperature : 0 SIRS Pulse: 1 SIRS Respirations : 1 SIRS WBC: 0 SIRS Score Sum : 2 Assess: if the MEWS score is Yellow or Red Were vital signs taken at a resting state?: Yes Focused Assessment: Change from prior assessment (see assessment flowsheet) Does the patient meet 2 or more of the SIRS criteria?: Yes Does the patient have a confirmed or suspected source of infection?: Yes Provider and Rapid Response Notified?: Yes MEWS guidelines implemented : Yes, red Treat MEWS Interventions: Considered administering scheduled or prn medications/treatments as ordered Take Vital Signs Increase Vital Sign Frequency : Red: Q1hr x2, continue Q4hrs until patient remains green for 12hrs Escalate MEWS: Escalate: Red: Discuss with charge nurse and notify provider. Consider notifying RRT. If remains red for 2 hours consider need for higher level of care Notify: Charge Nurse/RN Name of Charge Nurse/RN Notified: Prisilla Provider Notification Provider Name/Title: Dr Violeta Gelinas Date Provider Notified: 08/16/22 Time Provider Notified: 1730 Method of Notification: Page Notification Reason: Change in status Test performed and critical result: EKG (MD ordered cardiology consult and labs.) Date Critical Result Received:  08/06/22 Time Critical Result Received: 1242 Provider response:  (fluid bolus) Date of Provider Response: 08/16/22 Time of Provider Response: 1730 Notify: Rapid Response Name of Rapid Response RN Notified: Trauma Date Rapid Response Notified: 08/07/22 Time Rapid Response Notified: 1320   Patient's HR has increased through day as well as temp.  Called trauma MD and wants to start with LR bolus and continue to monitor.  Is know to have multiple abscess with drains and one expected to be placed tomorrow in IR.    Mauricia Area 08/16/2022, 5:59 PM
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
## 2022-08-16 NOTE — Consult Note (Signed)
WOC Nurse Consult Note: First consult for patient with new ileostomy (3/28) Dr. Dossie Der.  WOC Nurse ostomy consult note Stoma type/location: RUQ ileostomy with red rubber catheter bridge in place. Secure Chat with [facility] PA-C M. Maczis and requested permission to remove bridge with next pouch change (likely Wednesday) Stomal assessment/size: 1 and 3/8 inches Peristomal assessment:  Treatment options for stomal/peristomal skin:  Output: in puch at time of change; Bedside RN reports several pouch changes over last two days due to high output. Ostomy pouching: 2pc. 2 and 1/4 inch pouching system applied with skin barrier ring and high output pouch. 2 and 1/4 inch high output Pouch is [employer] # [id number] 2 and 1/4 inch skin barrier is [employer] [phone] Regular drainable 2 and 1/4 inch pouch is [employer] [place] #[id number] Skin barrier ring (stretch to fit around stoma) is [employer] # [phone] Education provided: None today. Patient has just finished PT session and is focused on ordering food. Patient states that the management of his ostomy will be a "team effort" with his mother, himself, his children and his sister and he continues to list names of people who he has to assist until he closes his eyes. Educational folder left in room.  Enrolled patient in [employer] DC program: Yes, today Supplies requested: 4 skin barrier rings (CeraPlus), 2 and 1/4 inches 4 opaque pouches, 2 and 1/4 inches 4 standard skin barrier rings (CeraPlus) 1 bottle stoma powder   WOC nursing team will follow, and will remain available to this patient, the nursing and medical teams.    Thank you for inviting [place] to participate in this patient's Plan of Care.  Ladona Mow, MSN, RN, CNS, GNP, Leda Min, [employer], [employer] phone:  [phone]
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
## 2022-08-17 ENCOUNTER — Inpatient Hospital Stay (HOSPITAL_COMMUNITY): Payer: Medicaid Other

## 2022-08-17 LAB — BASIC METABOLIC PANEL
Anion gap: 8 (ref 5–15)
BUN: 14 mg/dL (ref 6–20)
CO2: 24 mmol/L (ref 22–32)
Calcium: 8.7 mg/dL — ABNORMAL LOW (ref 8.9–10.3)
Chloride: 99 mmol/L (ref 98–111)
Creatinine, Ser: 0.57 mg/dL — ABNORMAL LOW (ref 0.61–1.24)
GFR, Estimated: >60 mL/min (ref >60)
Glucose, Bld: 94 mg/dL (ref 70–99)
Potassium: 4.6 mmol/L (ref 3.5–5.1)
Sodium: 131 mmol/L — ABNORMAL LOW (ref 135–145)

## 2022-08-17 LAB — CBC
HCT: 26.9 % — ABNORMAL LOW (ref 39.0–52.0)
Hemoglobin: 8.5 g/dL — ABNORMAL LOW (ref 13.0–17.0)
MCH: 27.2 pg (ref 26.0–34.0)
MCHC: 31.6 g/dL (ref 30.0–36.0)
MCV: 86.2 fL (ref 80.0–100.0)
Platelets: 706 10*3/uL — ABNORMAL HIGH (ref 150–400)
RBC: 3.12 MIL/uL — ABNORMAL LOW (ref 4.22–5.81)
RDW: 18.9 % — ABNORMAL HIGH (ref 11.5–15.5)
WBC: 9.1 10*3/uL (ref 4.0–10.5)
nRBC: 0 % (ref 0.0–0.2)

## 2022-08-17 LAB — TYPE AND SCREEN
ABO/RH(D): A POS
Antibody Screen: NEGATIVE
Unit division: 0

## 2022-08-17 LAB — GLUCOSE, CAPILLARY
Glucose-Capillary: 113 mg/dL — ABNORMAL HIGH (ref 70–99)
Glucose-Capillary: 106 mg/dL — ABNORMAL HIGH (ref 70–99)
Glucose-Capillary: 100 mg/dL — ABNORMAL HIGH (ref 70–99)
Glucose-Capillary: 119 mg/dL — ABNORMAL HIGH (ref 70–99)

## 2022-08-17 LAB — MAGNESIUM: Magnesium: 1.8 mg/dL (ref 1.7–2.4)

## 2022-08-17 LAB — BPAM RBC: Blood Product Expiration Date: 202405052359

## 2022-08-17 LAB — PHOSPHORUS: Phosphorus: 4.9 mg/dL — ABNORMAL HIGH (ref 2.5–4.6)

## 2022-08-17 LAB — AEROBIC/ANAEROBIC CULTURE W GRAM STAIN (SURGICAL/DEEP WOUND)

## 2022-08-17 MED ORDER — MAGNESIUM SULFATE 2 GM/50ML IV SOLN
2.0000 g | Freq: Once | INTRAVENOUS | Status: AC
  Administered 2022-08-17: 2 g via INTRAVENOUS
  Filled 2022-08-17: qty 50

## 2022-08-17 MED ORDER — TRAZODONE HCL 50 MG PO TABS
50.0000 mg | ORAL_TABLET | Freq: Every evening | ORAL | Status: DC | PRN
  Administered 2022-08-17: 50 mg via ORAL
  Filled 2022-08-17: qty 1

## 2022-08-17 MED ORDER — MIDAZOLAM HCL 2 MG/2ML IJ SOLN
INTRAMUSCULAR | Status: AC
  Filled 2022-08-17: qty 2

## 2022-08-17 MED ORDER — HEPARIN (PORCINE) 25000 UT/250ML-% IV SOLN
1750.0000 [IU]/h | INTRAVENOUS | Status: AC
  Administered 2022-08-17: 1750 [IU]/h via INTRAVENOUS
  Filled 2022-08-17: qty 250

## 2022-08-17 MED ORDER — TRAVASOL 10 % IV SOLN
INTRAVENOUS | Status: AC
  Filled 2022-08-17: qty 588

## 2022-08-17 MED ORDER — DIPHENOXYLATE-ATROPINE 2.5-0.025 MG PO TABS
1.0000 | ORAL_TABLET | Freq: Two times a day (BID) | ORAL | Status: DC
  Administered 2022-08-17 – 2022-08-20 (×6): 1 via ORAL
  Filled 2022-08-17 (×6): qty 1

## 2022-08-17 MED ORDER — FERROUS SULFATE 325 (65 FE) MG PO TABS
325.0000 mg | ORAL_TABLET | Freq: Two times a day (BID) | ORAL | Status: DC
  Administered 2022-08-17 – 2022-09-03 (×32): 325 mg via ORAL
  Filled 2022-08-17 (×33): qty 1

## 2022-08-17 MED ORDER — OXYCODONE HCL ER 15 MG PO T12A
15.0000 mg | EXTENDED_RELEASE_TABLET | Freq: Two times a day (BID) | ORAL | Status: DC
  Administered 2022-08-17 – 2022-08-25 (×17): 15 mg via ORAL
  Filled 2022-08-17 (×17): qty 1

## 2022-08-17 MED ORDER — FENTANYL CITRATE (PF) 100 MCG/2ML IJ SOLN
INTRAMUSCULAR | Status: AC
  Filled 2022-08-17: qty 2

## 2022-08-17 MED ORDER — MIDAZOLAM HCL 2 MG/2ML IJ SOLN
INTRAMUSCULAR | Status: AC | PRN
  Administered 2022-08-17: 1 mg via INTRAVENOUS

## 2022-08-17 MED ORDER — MIRTAZAPINE 15 MG PO TABS
7.5000 mg | ORAL_TABLET | Freq: Every day | ORAL | Status: DC
  Administered 2022-08-17: 7.5 mg via ORAL
  Filled 2022-08-17: qty 1

## 2022-08-17 MED ORDER — LOPERAMIDE HCL 2 MG PO CAPS
4.0000 mg | ORAL_CAPSULE | Freq: Four times a day (QID) | ORAL | Status: DC
  Administered 2022-08-17 – 2022-09-03 (×65): 4 mg via ORAL
  Filled 2022-08-17 (×65): qty 2

## 2022-08-17 MED ORDER — MIRTAZAPINE 15 MG PO TABS
15.0000 mg | ORAL_TABLET | Freq: Every day | ORAL | Status: DC
  Administered 2022-08-17: 15 mg via ORAL
  Filled 2022-08-17: qty 1

## 2022-08-17 MED ORDER — FENTANYL CITRATE (PF) 100 MCG/2ML IJ SOLN
INTRAMUSCULAR | Status: AC | PRN
  Administered 2022-08-17: 50 ug via INTRAVENOUS
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
## 2022-08-17 NOTE — Progress Notes (Signed)
PT Cancellation Note  Patient Details Name: Shane Klein MRN: [id number] DOB: 1990/05/11   Cancelled Treatment:    Reason Eval/Treat Not Completed: Patient at procedure or test/unavailable. Pt off floor at IR for drain placement. Acute PT to return as able to progress mobility.  Lewis Shock, PT, DPT Acute Rehabilitation Services Secure chat preferred Office #: [phone]    Iona Hansen 08/17/2022, 11:25 AM
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
## 2022-08-17 NOTE — Progress Notes (Signed)
MEWS Progress Note  Patient Details Name: Shane Klein MRN: [id number] DOB: 10/09/1990 Today's Date: 08/17/2022   MEWS Flowsheet Documentation:  Assess: MEWS Score Temp: 98.9 F (37.2 C) BP: 127/77 MAP (mmHg): 92 Pulse Rate: (!) 111 ECG Heart Rate: (!) 111 Resp: 14 Level of Consciousness: Alert SpO2: 99 % O2 Device: Room Air Patient Activity (if Appropriate): In bed O2 Flow Rate (L/min): 2 L/min FiO2 (%): (!) 0 % Assess: MEWS Score MEWS Temp: 0 MEWS Systolic: 0 MEWS Pulse: 2 MEWS RR: 0 MEWS LOC: 0 MEWS Score: 2 MEWS Score Color: Yellow Assess: SIRS CRITERIA SIRS Temperature : 0 SIRS Respirations : 0 SIRS Pulse: 1 SIRS WBC: 0 SIRS Score Sum : 1 SIRS Temperature : 0 SIRS Pulse: 1 SIRS Respirations : 0 SIRS WBC: 0 SIRS Score Sum : 1 Assess: if the MEWS score is Yellow or Red Were vital signs taken at a resting state?: Yes Focused Assessment: No change from prior assessment Does the patient meet 2 or more of the SIRS criteria?: Yes Does the patient have a confirmed or suspected source of infection?: Yes Provider and Rapid Response Notified?: No MEWS guidelines implemented : Yes, yellow Treat MEWS Interventions: Considered administering scheduled or prn medications/treatments as ordered Take Vital Signs Increase Vital Sign Frequency : Yellow: Q2hr x1, continue Q4hrs until patient remains green for 12hrs Escalate MEWS: Escalate: Yellow: Discuss with charge nurse and consider notifying provider and/or RRT Notify: Charge Nurse/RN Name of Charge Nurse/RN Notified: Prisilla Provider Notification Provider Name/Title: Leary Roca Date Provider Notified: 08/17/22 Time Provider Notified: 0820 Method of Notification: Rounds Notification Reason: Change in status Test performed and critical result: EKG (MD ordered cardiology consult and labs.) Date Critical Result Received: 08/06/22 Time Critical Result Received: 1242 Provider response: At bedside Date of  Provider Response: 08/17/22 Time of Provider Response: 0820 Notify: Rapid Response Name of Rapid Response RN Notified: Trauma Date Rapid Response Notified: 08/07/22 Time Rapid Response Notified: 1320   MD is aware of increased HR, ABX coverage is being expanded and plans to have abscess drained in ER today.    Mauricia Area 08/17/2022, 9:47 AM

## 2022-08-17 NOTE — Procedures (Addendum)
Interventional Radiology Procedure Note  Procedure: Image guided aspiration of right thigh muscles, 3 separate pockets identified by CT & [place].  Total ~80cc of murky brown thin fluid aspirated from all sites.  Single sample for culture.   Complications: None  EBL: None Sample: Culture sent  Recommendations: - routine wound care - follow up Cx -ok to restart AC as needed    Signed,  Yvone Neu. Loreta Ave, DO
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
## 2022-08-18 LAB — GLUCOSE, CAPILLARY
Glucose-Capillary: 108 mg/dL — ABNORMAL HIGH (ref 70–99)
Glucose-Capillary: 114 mg/dL — ABNORMAL HIGH (ref 70–99)

## 2022-08-18 LAB — MAGNESIUM: Magnesium: 2.1 mg/dL (ref 1.7–2.4)

## 2022-08-18 LAB — BASIC METABOLIC PANEL
Anion gap: 9 (ref 5–15)
BUN: 12 mg/dL (ref 6–20)
CO2: 23 mmol/L (ref 22–32)
Calcium: 8.8 mg/dL — ABNORMAL LOW (ref 8.9–10.3)
Chloride: 98 mmol/L (ref 98–111)
Creatinine, Ser: 0.62 mg/dL (ref 0.61–1.24)
GFR, Estimated: >60 mL/min (ref >60)
Glucose, Bld: 109 mg/dL — ABNORMAL HIGH (ref 70–99)
Potassium: 4.3 mmol/L (ref 3.5–5.1)
Sodium: 130 mmol/L — ABNORMAL LOW (ref 135–145)

## 2022-08-18 LAB — CBC
HCT: 28.9 % — ABNORMAL LOW (ref 39.0–52.0)
Hemoglobin: 9.2 g/dL — ABNORMAL LOW (ref 13.0–17.0)
MCH: 27.7 pg (ref 26.0–34.0)
MCHC: 31.8 g/dL (ref 30.0–36.0)
MCV: 87 fL (ref 80.0–100.0)
Platelets: 727 10*3/uL — ABNORMAL HIGH (ref 150–400)
RBC: 3.32 MIL/uL — ABNORMAL LOW (ref 4.22–5.81)
RDW: 18.7 % — ABNORMAL HIGH (ref 11.5–15.5)
WBC: 10.3 10*3/uL (ref 4.0–10.5)
nRBC: 0 % (ref 0.0–0.2)

## 2022-08-18 LAB — HEPARIN LEVEL (UNFRACTIONATED)
Heparin Unfractionated: 0.18 IU/mL — ABNORMAL LOW (ref 0.30–0.70)
Heparin Unfractionated: 0.45 IU/mL (ref 0.30–0.70)

## 2022-08-18 LAB — PHOSPHORUS: Phosphorus: 4.4 mg/dL (ref 2.5–4.6)

## 2022-08-18 LAB — AEROBIC/ANAEROBIC CULTURE W GRAM STAIN (SURGICAL/DEEP WOUND): Gram Stain: NONE SEEN

## 2022-08-18 MED ORDER — CALCIUM POLYCARBOPHIL 625 MG PO TABS
625.0000 mg | ORAL_TABLET | Freq: Two times a day (BID) | ORAL | Status: DC
  Administered 2022-08-18 – 2022-08-30 (×25): 625 mg via ORAL
  Filled 2022-08-18 (×25): qty 1

## 2022-08-18 MED ORDER — MIRTAZAPINE 15 MG PO TABS
7.5000 mg | ORAL_TABLET | Freq: Every day | ORAL | Status: DC
  Administered 2022-08-18: 7.5 mg via ORAL
  Filled 2022-08-18: qty 1

## 2022-08-18 MED ORDER — TRAZODONE HCL 100 MG PO TABS
100.0000 mg | ORAL_TABLET | Freq: Every evening | ORAL | Status: DC | PRN
  Administered 2022-08-19 – 2022-08-31 (×12): 100 mg via ORAL
  Filled 2022-08-18 (×13): qty 1

## 2022-08-18 MED ORDER — TRAVASOL 10 % IV SOLN
INTRAVENOUS | Status: AC
  Filled 2022-08-18: qty 1123.2

## 2022-08-18 MED ORDER — APIXABAN 5 MG PO TABS
5.0000 mg | ORAL_TABLET | Freq: Two times a day (BID) | ORAL | Status: DC
  Administered 2022-08-18 – 2022-08-25 (×16): 5 mg via ORAL
  Filled 2022-08-18 (×16): qty 1
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
## 2022-08-18 NOTE — Consult Note (Signed)
WOC Nurse ostomy follow up; ileostomy placed 07/22/2022  Stoma type/location: RUQ  ileostomy  Stomal assessment/size: 1 3/8" round, well budded, pink and moist  Peristomal assessment: skin noted to be denuded at 6 o'clock and 9 o'clock; has midline incision to left of ostomy with retention sutures in place  Treatment options for stomal/peristomal skin: crusted denuded skin with ostomy powder and no sting barrier film, 2" barrier ring  Output emptied 100 mls liquid green effluent from bag, ostomy with copious output during entire pouch change  Ostomy pouching: 2 pc 2 3/4" skin barrierr [place] #2), 2 3/4" high output pouch [place] [phone]) and 2" barrier ring [place] [phone]) Education provided: patient alert, engaged and sitting up in chair.  Discussed with patient the need to empty ostomy pouch when 1/3 to 1/2 full.  Also discussed changing entire pouching system 2 times weekly and if begins to leak.  Demonstrated emptying of pouch and utilizing toilet paper wick to clean spout.  Demonstrated to patient push pull method to remove pouch.  WOC RN removed red rubber catheter at this pouch change per order by [employer], [employer].   Demonstrated measuring of stoma to patient, measured at 1 3/8".  Cleaned around stoma with water moistened washcloth.  Discussed and demonstrated crusting of denuded skin in several places around ostomy.  We discussed importance of crusting if skin breakdown occurs and consistent use of  2" barrier ring to protect skin.  Demonstrated how to cut skin barrier to size of stoma. Placed 2" barrier ring and 2 3/4" skin barrier around stoma.  Placed 2 3/4" high output pouch on today and connected to a bedside drainage bag.    At this session did go over educational materials in booklet including step by step process for changing 2 piece ostomy pouching system and reviewed videos patient could watch on smart phone.  We discussed taking a shower with pouch on or off if it is the day to change pouching  system.  Also discussed how ileostomies can be high output of liquid stool and steps to prevent dehydration. Also briefly discussed foods to avoid that may cause obstruction and the importance of chewing foods thoroughly.   Patient says his sister and child's mother both have experience in taking care of ostomies in other family members. Offered to set up a time with family members for education as well.  Will reassess week of 08/23/2022.   Enrolled patient in [facility] [employer] Discharge program: Yes previously  I did order at this visit (5) 2 3/4" high output pouches and stoma powder.  Skin prep in room as well as 2 3/4" skin barriers.    WOC team will continue to follow with patient for ostomy education and support.    Thank you,   Priscella Mann MSN, RN-BC, [employer] [phone]

## 2022-08-18 NOTE — Plan of Care (Signed)
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
## 2022-08-19 DIAGNOSIS — W3400XA Accidental discharge from unspecified firearms or gun, initial encounter: Secondary | ICD-10-CM | POA: Diagnosis not present

## 2022-08-19 DIAGNOSIS — F430 Acute stress reaction: Secondary | ICD-10-CM | POA: Diagnosis not present

## 2022-08-19 LAB — CBC WITH DIFFERENTIAL/PLATELET
Abs Immature Granulocytes: 0.2 10*3/uL — ABNORMAL HIGH (ref 0.00–0.07)
Basophils Absolute: 0 10*3/uL (ref 0.0–0.1)
Basophils Relative: 0 %
Eosinophils Absolute: 0.2 10*3/uL (ref 0.0–0.5)
Eosinophils Relative: 2 %
HCT: 27 % — ABNORMAL LOW (ref 39.0–52.0)
Hemoglobin: 9 g/dL — ABNORMAL LOW (ref 13.0–17.0)
Immature Granulocytes: 2 %
Lymphocytes Relative: 12 %
Lymphs Abs: 1.4 10*3/uL (ref 0.7–4.0)
MCH: 28.3 pg (ref 26.0–34.0)
MCHC: 33.3 g/dL (ref 30.0–36.0)
MCV: 84.9 fL (ref 80.0–100.0)
Monocytes Absolute: 1.1 10*3/uL — ABNORMAL HIGH (ref 0.1–1.0)
Monocytes Relative: 10 %
Neutro Abs: 8.2 10*3/uL — ABNORMAL HIGH (ref 1.7–7.7)
Neutrophils Relative %: 74 %
Platelets: 616 10*3/uL — ABNORMAL HIGH (ref 150–400)
RBC: 3.18 MIL/uL — ABNORMAL LOW (ref 4.22–5.81)
RDW: 17.9 % — ABNORMAL HIGH (ref 11.5–15.5)
WBC: 11.1 10*3/uL — ABNORMAL HIGH (ref 4.0–10.5)
nRBC: 0 % (ref 0.0–0.2)

## 2022-08-19 LAB — COMPREHENSIVE METABOLIC PANEL
ALT: 19 U/L (ref 0–44)
AST: 12 U/L — ABNORMAL LOW (ref 15–41)
Albumin: 1.9 g/dL — ABNORMAL LOW (ref 3.5–5.0)
Alkaline Phosphatase: 80 U/L (ref 38–126)
Anion gap: 11 (ref 5–15)
BUN: 18 mg/dL (ref 6–20)
CO2: 27 mmol/L (ref 22–32)
Calcium: 8.7 mg/dL — ABNORMAL LOW (ref 8.9–10.3)
Chloride: 95 mmol/L — ABNORMAL LOW (ref 98–111)
Creatinine, Ser: 0.57 mg/dL — ABNORMAL LOW (ref 0.61–1.24)
GFR, Estimated: >60 mL/min (ref >60)
Glucose, Bld: 93 mg/dL (ref 70–99)
Potassium: 4.4 mmol/L (ref 3.5–5.1)
Sodium: 133 mmol/L — ABNORMAL LOW (ref 135–145)
Total Bilirubin: 0.5 mg/dL (ref 0.3–1.2)
Total Protein: 6.7 g/dL (ref 6.5–8.1)

## 2022-08-19 LAB — MAGNESIUM: Magnesium: 1.7 mg/dL (ref 1.7–2.4)

## 2022-08-19 LAB — PHOSPHORUS: Phosphorus: 4.2 mg/dL (ref 2.5–4.6)

## 2022-08-19 LAB — GLUCOSE, CAPILLARY: Glucose-Capillary: 97 mg/dL (ref 70–99)

## 2022-08-19 LAB — AEROBIC/ANAEROBIC CULTURE W GRAM STAIN (SURGICAL/DEEP WOUND)

## 2022-08-19 LAB — TRIGLYCERIDES: Triglycerides: 87 mg/dL (ref <150)

## 2022-08-19 MED ORDER — MAGNESIUM SULFATE 2 GM/50ML IV SOLN
2.0000 g | Freq: Once | INTRAVENOUS | Status: AC
  Administered 2022-08-19: 2 g via INTRAVENOUS
  Filled 2022-08-19: qty 50

## 2022-08-19 MED ORDER — TRAVASOL 10 % IV SOLN
INTRAVENOUS | Status: AC
  Filled 2022-08-19: qty 1123.2

## 2022-08-19 MED ORDER — MIRTAZAPINE 15 MG PO TABS
15.0000 mg | ORAL_TABLET | Freq: Every day | ORAL | Status: DC
  Administered 2022-08-19 – 2022-09-02 (×15): 15 mg via ORAL
  Filled 2022-08-19 (×15): qty 1
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
## 2022-08-19 NOTE — Progress Notes (Signed)
Inpatient Rehabilitation Admissions Coordinator   We continue to follow at a distance.  Ottie Glazier, RN, MSN Rehab Admissions Coordinator [phone] 08/19/2022 12:34 PM
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
## 2022-08-20 LAB — CBC WITH DIFFERENTIAL/PLATELET
Abs Immature Granulocytes: 0.09 10*3/uL — ABNORMAL HIGH (ref 0.00–0.07)
Basophils Absolute: 0 10*3/uL (ref 0.0–0.1)
Basophils Relative: 0 %
Eosinophils Absolute: 0.2 10*3/uL (ref 0.0–0.5)
Eosinophils Relative: 2 %
HCT: 26.9 % — ABNORMAL LOW (ref 39.0–52.0)
Hemoglobin: 8.7 g/dL — ABNORMAL LOW (ref 13.0–17.0)
Immature Granulocytes: 1 %
Lymphocytes Relative: 11 %
Lymphs Abs: 1.1 10*3/uL (ref 0.7–4.0)
MCH: 28.8 pg (ref 26.0–34.0)
MCHC: 32.3 g/dL (ref 30.0–36.0)
MCV: 89.1 fL (ref 80.0–100.0)
Monocytes Absolute: 0.8 10*3/uL (ref 0.1–1.0)
Monocytes Relative: 9 %
Neutro Abs: 7.4 10*3/uL (ref 1.7–7.7)
Neutrophils Relative %: 77 %
Platelets: 633 10*3/uL — ABNORMAL HIGH (ref 150–400)
RBC: 3.02 MIL/uL — ABNORMAL LOW (ref 4.22–5.81)
RDW: 17.5 % — ABNORMAL HIGH (ref 11.5–15.5)
WBC: 9.6 10*3/uL (ref 4.0–10.5)
nRBC: 0 % (ref 0.0–0.2)

## 2022-08-20 LAB — BASIC METABOLIC PANEL
Anion gap: 7 (ref 5–15)
BUN: 14 mg/dL (ref 6–20)
CO2: 25 mmol/L (ref 22–32)
Calcium: 8.4 mg/dL — ABNORMAL LOW (ref 8.9–10.3)
Chloride: 103 mmol/L (ref 98–111)
Creatinine, Ser: 0.55 mg/dL — ABNORMAL LOW (ref 0.61–1.24)
GFR, Estimated: >60 mL/min (ref >60)
Glucose, Bld: 108 mg/dL — ABNORMAL HIGH (ref 70–99)
Potassium: 4.2 mmol/L (ref 3.5–5.1)
Sodium: 135 mmol/L (ref 135–145)

## 2022-08-20 LAB — MAGNESIUM: Magnesium: 1.8 mg/dL (ref 1.7–2.4)

## 2022-08-20 LAB — PHOSPHORUS: Phosphorus: 3.8 mg/dL (ref 2.5–4.6)

## 2022-08-20 LAB — GLUCOSE, CAPILLARY: Glucose-Capillary: 105 mg/dL — ABNORMAL HIGH (ref 70–99)

## 2022-08-20 MED ORDER — HYDROMORPHONE HCL 1 MG/ML IJ SOLN
0.5000 mg | Freq: Four times a day (QID) | INTRAMUSCULAR | Status: DC | PRN
  Administered 2022-08-20 – 2022-08-21 (×3): 1 mg via INTRAVENOUS
  Filled 2022-08-20 (×3): qty 1

## 2022-08-20 MED ORDER — DIPHENOXYLATE-ATROPINE 2.5-0.025 MG PO TABS
1.0000 | ORAL_TABLET | Freq: Three times a day (TID) | ORAL | Status: DC
  Administered 2022-08-20 – 2022-08-23 (×9): 1 via ORAL
  Filled 2022-08-20 (×9): qty 1

## 2022-08-20 MED ORDER — TRAVASOL 10 % IV SOLN
INTRAVENOUS | Status: AC
  Filled 2022-08-20: qty 1123.2

## 2022-08-20 MED ORDER — MAGNESIUM SULFATE 2 GM/50ML IV SOLN
2.0000 g | Freq: Once | INTRAVENOUS | Status: AC
  Administered 2022-08-20: 2 g via INTRAVENOUS
  Filled 2022-08-20: qty 50
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
## 2022-08-20 NOTE — Plan of Care (Signed)
  Problem: Coping: Goal: Ability to adjust to condition or change in health will improve Outcome: Progressing   Problem: Fluid Volume: Goal: Ability to maintain a balanced intake and output will improve Outcome: Progressing   Problem: Metabolic: Goal: Ability to maintain appropriate glucose levels will improve Outcome: Progressing   Problem: Nutritional: Goal: Maintenance of adequate nutrition will improve Outcome: Progressing Goal: Progress toward achieving an optimal weight will improve Outcome: Progressing   Problem: Tissue Perfusion: Goal: Adequacy of tissue perfusion will improve Outcome: Progressing
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
## 2022-08-20 NOTE — Progress Notes (Signed)
Brief Nutrition Note  Plan today was for Cortrak placement to start nocturnal tube feeds in an attempt to wean pt from TPN. Pt refused Cortrak placement on Wednesday and refused again this AM despite max encouragement. Cortrak consult has been discontinued per MD.  Per previous notes, pt is not motivated to increase his oral intake despite multiple dietitians offering interventions and supplements. Pt has been educated extensively on why nutrition is important for his recovery, but his oral intake remains inadequate. It is highly unlikely that pt will meet his recommended nutrition needs orally until he becomes motivated to do so.  Recommend continuing supplemental TPN during hospital admission until pt is meeting >60% of needs through PO diet. However, if nutrition is the only barrier to discharge, TPN can be discontinued as pt is not motivated to increase his PO intake and continues to refuse Cortrak placement for supplemental enteral nutrition.  RD will continue to follow pt during admission.   Mertie Clause, MS, RD, LDN Inpatient Clinical Dietitian Please see AMiON for contact information.

## 2022-08-20 NOTE — Progress Notes (Signed)
PT/OT was working with the patient and found 2 pills in the bed. One was Vitamin D and one was Oxycontin. I witnessed the patient take these pills this morning so they must have been buried in the sheets from a prior shift. Almira Coaster, RN (Press photographer) witnessed me waste both pills in the sharp container.
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
## 2022-08-21 LAB — CBC WITH DIFFERENTIAL/PLATELET
Abs Immature Granulocytes: 0.08 10*3/uL — ABNORMAL HIGH (ref 0.00–0.07)
Basophils Absolute: 0 10*3/uL (ref 0.0–0.1)
Basophils Relative: 0 %
Eosinophils Absolute: 0.2 10*3/uL (ref 0.0–0.5)
Eosinophils Relative: 2 %
HCT: 25.4 % — ABNORMAL LOW (ref 39.0–52.0)
Hemoglobin: 8.4 g/dL — ABNORMAL LOW (ref 13.0–17.0)
Immature Granulocytes: 1 %
Lymphocytes Relative: 12 %
Lymphs Abs: 1.1 10*3/uL (ref 0.7–4.0)
MCH: 28.1 pg (ref 26.0–34.0)
MCHC: 33.1 g/dL (ref 30.0–36.0)
MCV: 84.9 fL (ref 80.0–100.0)
Monocytes Absolute: 0.8 10*3/uL (ref 0.1–1.0)
Monocytes Relative: 8 %
Neutro Abs: 7.4 10*3/uL (ref 1.7–7.7)
Neutrophils Relative %: 77 %
Platelets: 542 10*3/uL — ABNORMAL HIGH (ref 150–400)
RBC: 2.99 MIL/uL — ABNORMAL LOW (ref 4.22–5.81)
RDW: 17.1 % — ABNORMAL HIGH (ref 11.5–15.5)
WBC: 9.6 10*3/uL (ref 4.0–10.5)
nRBC: 0 % (ref 0.0–0.2)

## 2022-08-21 LAB — MAGNESIUM: Magnesium: 1.8 mg/dL (ref 1.7–2.4)

## 2022-08-21 LAB — BASIC METABOLIC PANEL
Anion gap: 6 (ref 5–15)
BUN: 16 mg/dL (ref 6–20)
CO2: 23 mmol/L (ref 22–32)
Calcium: 8.4 mg/dL — ABNORMAL LOW (ref 8.9–10.3)
Chloride: 106 mmol/L (ref 98–111)
Creatinine, Ser: 0.5 mg/dL — ABNORMAL LOW (ref 0.61–1.24)
GFR, Estimated: >60 mL/min (ref >60)
Glucose, Bld: 95 mg/dL (ref 70–99)
Potassium: 3.9 mmol/L (ref 3.5–5.1)
Sodium: 135 mmol/L (ref 135–145)

## 2022-08-21 LAB — PHOSPHORUS: Phosphorus: 3.4 mg/dL (ref 2.5–4.6)

## 2022-08-21 LAB — AEROBIC/ANAEROBIC CULTURE W GRAM STAIN (SURGICAL/DEEP WOUND)

## 2022-08-21 MED ORDER — MAGNESIUM SULFATE 4 GM/100ML IV SOLN
4.0000 g | Freq: Once | INTRAVENOUS | Status: AC
  Administered 2022-08-21: 4 g via INTRAVENOUS
  Filled 2022-08-21: qty 100

## 2022-08-21 MED ORDER — HYDROMORPHONE HCL 1 MG/ML IJ SOLN
0.5000 mg | INTRAMUSCULAR | Status: DC | PRN
  Administered 2022-08-21 – 2022-08-23 (×11): 1 mg via INTRAVENOUS
  Filled 2022-08-21 (×12): qty 1

## 2022-08-21 MED ORDER — POTASSIUM CHLORIDE 10 MEQ/50ML IV SOLN
10.0000 meq | INTRAVENOUS | Status: AC
  Administered 2022-08-21 (×2): 10 meq via INTRAVENOUS
  Filled 2022-08-21 (×2): qty 50

## 2022-08-21 MED ORDER — TRAVASOL 10 % IV SOLN
INTRAVENOUS | Status: AC
  Filled 2022-08-21: qty 1123.2
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
## 2022-08-22 LAB — CBC WITH DIFFERENTIAL/PLATELET
Abs Immature Granulocytes: 0.08 10*3/uL — ABNORMAL HIGH (ref 0.00–0.07)
Basophils Absolute: 0.1 10*3/uL (ref 0.0–0.1)
Basophils Relative: 1 %
Eosinophils Absolute: 0.2 10*3/uL (ref 0.0–0.5)
Eosinophils Relative: 2 %
HCT: 26.1 % — ABNORMAL LOW (ref 39.0–52.0)
Hemoglobin: 8.6 g/dL — ABNORMAL LOW (ref 13.0–17.0)
Immature Granulocytes: 1 %
Lymphocytes Relative: 13 %
Lymphs Abs: 1.4 10*3/uL (ref 0.7–4.0)
MCH: 28 pg (ref 26.0–34.0)
MCHC: 33 g/dL (ref 30.0–36.0)
MCV: 85 fL (ref 80.0–100.0)
Monocytes Absolute: 0.9 10*3/uL (ref 0.1–1.0)
Monocytes Relative: 8 %
Neutro Abs: 7.9 10*3/uL — ABNORMAL HIGH (ref 1.7–7.7)
Neutrophils Relative %: 75 %
Platelets: 557 10*3/uL — ABNORMAL HIGH (ref 150–400)
RBC: 3.07 MIL/uL — ABNORMAL LOW (ref 4.22–5.81)
RDW: 16.9 % — ABNORMAL HIGH (ref 11.5–15.5)
WBC: 10.4 10*3/uL (ref 4.0–10.5)
nRBC: 0 % (ref 0.0–0.2)

## 2022-08-22 LAB — PHOSPHORUS: Phosphorus: 3.7 mg/dL (ref 2.5–4.6)

## 2022-08-22 LAB — BASIC METABOLIC PANEL
Anion gap: 7 (ref 5–15)
BUN: 16 mg/dL (ref 6–20)
CO2: 22 mmol/L (ref 22–32)
Calcium: 8.8 mg/dL — ABNORMAL LOW (ref 8.9–10.3)
Chloride: 104 mmol/L (ref 98–111)
Creatinine, Ser: 0.46 mg/dL — ABNORMAL LOW (ref 0.61–1.24)
GFR, Estimated: >60 mL/min (ref >60)
Glucose, Bld: 93 mg/dL (ref 70–99)
Potassium: 4.2 mmol/L (ref 3.5–5.1)
Sodium: 133 mmol/L — ABNORMAL LOW (ref 135–145)

## 2022-08-22 LAB — MAGNESIUM: Magnesium: 1.7 mg/dL (ref 1.7–2.4)

## 2022-08-22 MED ORDER — MAGNESIUM SULFATE 4 GM/100ML IV SOLN
4.0000 g | Freq: Once | INTRAVENOUS | Status: AC
  Administered 2022-08-22: 4 g via INTRAVENOUS
  Filled 2022-08-22: qty 100

## 2022-08-22 MED ORDER — TRAVASOL 10 % IV SOLN
INTRAVENOUS | Status: AC
  Filled 2022-08-22: qty 1123.2
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
## 2022-08-23 LAB — COMPREHENSIVE METABOLIC PANEL
ALT: 80 U/L — ABNORMAL HIGH (ref 0–44)
AST: 46 U/L — ABNORMAL HIGH (ref 15–41)
Albumin: 2.1 g/dL — ABNORMAL LOW (ref 3.5–5.0)
Alkaline Phosphatase: 104 U/L (ref 38–126)
Anion gap: 6 (ref 5–15)
BUN: 17 mg/dL (ref 6–20)
CO2: 24 mmol/L (ref 22–32)
Calcium: 8.7 mg/dL — ABNORMAL LOW (ref 8.9–10.3)
Chloride: 105 mmol/L (ref 98–111)
Creatinine, Ser: 0.45 mg/dL — ABNORMAL LOW (ref 0.61–1.24)
GFR, Estimated: >60 mL/min (ref >60)
Glucose, Bld: 94 mg/dL (ref 70–99)
Potassium: 4.4 mmol/L (ref 3.5–5.1)
Sodium: 135 mmol/L (ref 135–145)
Total Bilirubin: 0.5 mg/dL (ref 0.3–1.2)
Total Protein: 6.9 g/dL (ref 6.5–8.1)

## 2022-08-23 LAB — CBC WITH DIFFERENTIAL/PLATELET
Abs Immature Granulocytes: 0.04 10*3/uL (ref 0.00–0.07)
Basophils Absolute: 0.1 10*3/uL (ref 0.0–0.1)
Basophils Relative: 1 %
Eosinophils Absolute: 0.2 10*3/uL (ref 0.0–0.5)
Eosinophils Relative: 2 %
HCT: 26.1 % — ABNORMAL LOW (ref 39.0–52.0)
Hemoglobin: 8.7 g/dL — ABNORMAL LOW (ref 13.0–17.0)
Immature Granulocytes: 0 %
Lymphocytes Relative: 10 %
Lymphs Abs: 0.9 10*3/uL (ref 0.7–4.0)
MCH: 30 pg (ref 26.0–34.0)
MCHC: 33.3 g/dL (ref 30.0–36.0)
MCV: 90 fL (ref 80.0–100.0)
Monocytes Absolute: 0.6 10*3/uL (ref 0.1–1.0)
Monocytes Relative: 6 %
Neutro Abs: 7.4 10*3/uL (ref 1.7–7.7)
Neutrophils Relative %: 81 %
Platelets: 593 10*3/uL — ABNORMAL HIGH (ref 150–400)
RBC: 2.9 MIL/uL — ABNORMAL LOW (ref 4.22–5.81)
RDW: 17.8 % — ABNORMAL HIGH (ref 11.5–15.5)
WBC: 9.1 10*3/uL (ref 4.0–10.5)
nRBC: 0 % (ref 0.0–0.2)

## 2022-08-23 LAB — MAGNESIUM: Magnesium: 1.6 mg/dL — ABNORMAL LOW (ref 1.7–2.4)

## 2022-08-23 LAB — PHOSPHORUS: Phosphorus: 4.2 mg/dL (ref 2.5–4.6)

## 2022-08-23 LAB — TRIGLYCERIDES: Triglycerides: 73 mg/dL (ref <150)

## 2022-08-23 MED ORDER — HYDROMORPHONE HCL 1 MG/ML IJ SOLN
0.5000 mg | INTRAMUSCULAR | Status: DC | PRN
  Administered 2022-08-23 – 2022-08-24 (×5): 0.5 mg via INTRAVENOUS
  Filled 2022-08-23 (×5): qty 0.5

## 2022-08-23 MED ORDER — TRAVASOL 10 % IV SOLN
INTRAVENOUS | Status: AC
  Filled 2022-08-23: qty 1123.2

## 2022-08-23 MED ORDER — DIPHENOXYLATE-ATROPINE 2.5-0.025 MG PO TABS
1.0000 | ORAL_TABLET | Freq: Four times a day (QID) | ORAL | Status: DC
  Administered 2022-08-23 (×3): 1 via ORAL
  Filled 2022-08-23 (×3): qty 1

## 2022-08-23 MED ORDER — ORAL CARE MOUTH RINSE: 15.0000 mL | OROMUCOSAL | Status: DC | PRN

## 2022-08-23 MED ORDER — ACETAMINOPHEN 500 MG PO TABS
1000.0000 mg | ORAL_TABLET | Freq: Four times a day (QID) | ORAL | Status: DC
  Administered 2022-08-23 – 2022-09-03 (×37): 1000 mg via ORAL
  Filled 2022-08-23 (×41): qty 2

## 2022-08-23 MED ORDER — MAGNESIUM OXIDE -MG SUPPLEMENT 400 (240 MG) MG PO TABS
400.0000 mg | ORAL_TABLET | Freq: Two times a day (BID) | ORAL | Status: DC
  Administered 2022-08-23 – 2022-09-03 (×21): 400 mg via ORAL
  Filled 2022-08-23 (×22): qty 1

## 2022-08-23 MED ORDER — MAGNESIUM SULFATE 4 GM/100ML IV SOLN
4.0000 g | Freq: Once | INTRAVENOUS | Status: AC
  Administered 2022-08-23: 4 g via INTRAVENOUS
  Filled 2022-08-23: qty 100
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
## 2022-08-23 NOTE — Consult Note (Addendum)
WOC Nurse ostomy follow up Pt currently has a fecal containment device in place over the stoma, instead of an ostomy pouch.  It is beginning to lift and pull away from the skin and leak. Applied barrier ring and two piece high output pouching system, attached to bedside drainage bag. Stoma type/location: Stoma is red and viable, 1 1/2 inches, above skin level. Stomal assessment/size: intact skin surrounding the stoma Output: bedside drainage bag with 200cc liquid green stool Educational materials in the room. Ordered 5 sets of supplies to the bedside:  1. 2 and 3/4 inch high output Pouch is [employer] [place] # [phone] 2. 2 and 3/4 inch skin barrier is Lawson #2 3. Regular drainable 2 and 3/4 inch pouch is Lawson #[phone] 4. Skin barrier ring (stretch to fit around stoma) is [employer] [place] # [phone] 5. Hook to bedside drainage bag Education provided: No family present and patient did not watch the pouch change procedure. He stated, "my sister  knows how to do that and will help me." Informed patient he will need to practice pouching application and emptying prior to discharge but he is not interested today.  Enrolled patient in [employer] Discharge program: Yes, previously WOC team will continue to follow for teaching sessions. Thank-you,  Cammie Mcgee MSN, RN, CWOCN, [place], CNS [phone]

## 2022-08-23 NOTE — Plan of Care (Signed)
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
## 2022-08-23 NOTE — Consult Note (Addendum)
WOC Nurse ostomy follow up Refer to previous WOC notes from earlier this morning.  Called back to room for leaking ostomy by the bedside nurse. There is a significant amt green liquid stool in the bedside drainage bag, however, the current pouch has already leaked around the inner edges. Pt has been using a high output pouch related to the large volume of liquid stool, however, if pouch leaks again he may require a flexible convex instead, which will require frequent emptying.  Applied barrier ring and 2 piece high output system at this time, since it was in the room. Attached to bedside drainage bag.  Assessed stoma while uncovered, and the os is located to the upper edge, not the lower, and this area is almost flush with skin level.  Skin is beginning to become macerated related to recent leakage.   Ordered stoma powder and one piece flexible convex pouches to the room for use with next pouch change.   Mother came to visit after the pouch change had already been performed.  She would like to learn about ostomy care when the next pouch change and teaching session is performed.  Thank-you,  Cammie Mcgee MSN, RN, CWOCN, [place], CNS [phone]
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
## 2022-08-24 LAB — CBC WITH DIFFERENTIAL/PLATELET
Abs Immature Granulocytes: 0.03 10*3/uL (ref 0.00–0.07)
Basophils Absolute: 0 10*3/uL (ref 0.0–0.1)
Basophils Relative: 1 %
Eosinophils Absolute: 0.2 10*3/uL (ref 0.0–0.5)
Eosinophils Relative: 2 %
HCT: 28.1 % — ABNORMAL LOW (ref 39.0–52.0)
Hemoglobin: 9.3 g/dL — ABNORMAL LOW (ref 13.0–17.0)
Immature Granulocytes: 0 %
Lymphocytes Relative: 15 %
Lymphs Abs: 1.2 10*3/uL (ref 0.7–4.0)
MCH: 28.4 pg (ref 26.0–34.0)
MCHC: 33.1 g/dL (ref 30.0–36.0)
MCV: 85.9 fL (ref 80.0–100.0)
Monocytes Absolute: 0.7 10*3/uL (ref 0.1–1.0)
Monocytes Relative: 8 %
Neutro Abs: 6.2 10*3/uL (ref 1.7–7.7)
Neutrophils Relative %: 74 %
Platelets: 611 10*3/uL — ABNORMAL HIGH (ref 150–400)
RBC: 3.27 MIL/uL — ABNORMAL LOW (ref 4.22–5.81)
RDW: 16.9 % — ABNORMAL HIGH (ref 11.5–15.5)
WBC: 8.3 10*3/uL (ref 4.0–10.5)
nRBC: 0 % (ref 0.0–0.2)

## 2022-08-24 LAB — BASIC METABOLIC PANEL
Anion gap: 6 (ref 5–15)
BUN: 17 mg/dL (ref 6–20)
CO2: 23 mmol/L (ref 22–32)
Calcium: 9 mg/dL (ref 8.9–10.3)
Chloride: 107 mmol/L (ref 98–111)
Creatinine, Ser: 0.41 mg/dL — ABNORMAL LOW (ref 0.61–1.24)
GFR, Estimated: >60 mL/min (ref >60)
Glucose, Bld: 99 mg/dL (ref 70–99)
Potassium: 3.8 mmol/L (ref 3.5–5.1)
Sodium: 136 mmol/L (ref 135–145)

## 2022-08-24 LAB — MAGNESIUM: Magnesium: 1.7 mg/dL (ref 1.7–2.4)

## 2022-08-24 LAB — PHOSPHORUS: Phosphorus: 4.1 mg/dL (ref 2.5–4.6)

## 2022-08-24 MED ORDER — DIPHENOXYLATE-ATROPINE 2.5-0.025 MG PO TABS
2.0000 | ORAL_TABLET | Freq: Three times a day (TID) | ORAL | Status: DC
  Administered 2022-08-24 – 2022-08-25 (×4): 2 via ORAL
  Filled 2022-08-24 (×4): qty 2

## 2022-08-24 MED ORDER — TRAVASOL 10 % IV SOLN
INTRAVENOUS | Status: AC
  Filled 2022-08-24: qty 561.6

## 2022-08-24 MED ORDER — MAGNESIUM SULFATE 4 GM/100ML IV SOLN
4.0000 g | Freq: Once | INTRAVENOUS | Status: AC
  Administered 2022-08-24: 4 g via INTRAVENOUS
  Filled 2022-08-24: qty 100

## 2022-08-24 MED ORDER — HYDROMORPHONE HCL 1 MG/ML IJ SOLN
0.5000 mg | Freq: Four times a day (QID) | INTRAMUSCULAR | Status: DC | PRN
  Administered 2022-08-24 – 2022-08-25 (×4): 0.5 mg via INTRAVENOUS
  Filled 2022-08-24 (×4): qty 0.5

## 2022-08-24 MED FILL — Medication: Qty: 1 | Status: AC
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
## 2022-08-24 NOTE — Consult Note (Addendum)
WOC Nurse ostomy follow up Ostomy pouch which was applied yesterday is intact with good seal, large amt liquid green stool in bedside drainage bag. Supplies ordered to bedside for staff nurses use.  Pt will need crusting with ostomy powder and skin prep to protect around stoma each time.  5 sets of supplies ordered to the bedside; barrier rings, [employer] # [phone], wafers Lawson # [phone], high output pouches [place] # [id number]. WOC team will continue to follow while in the hospital. Thank-you,  Cammie Mcgee MSN, RN, CWOCN, [place], CNS [phone]

## 2022-08-24 NOTE — Progress Notes (Signed)
Calorie Count Note: Day 1 Results  48-hour calorie count ordered yesterday at 1001. Please see day 1 results below. Spoke with pt at bedside. Pt reports only eating food that his mom brought him yesterday from [employer] and [employer]. He states that he has not yet had anything to eat this morning and that he is waiting on his mom to bring him something. He does not want to eat any food from the hospital because he believes it makes him vomit.  Diet: regular, thin liquids Supplements: N/A (pt has declined all available oral nutrition supplements offered)  Day 1: 4/29 Lunch: ~675 kcal, 16 grams of protein (75% of a 10-inch vegan cheese pizza on gluten-free crust from [employer] + 4-5 plain chicken wings from [employer]) 4/29 Dinner: did not eat due to being "too full" from lunch 4/30 Breakfast: 0 kcal, 0 grams of protein (has not eaten breakfast as of 0920 today) Snacks: 5 kcal, 0 grams of protein (50% of an electrolyte beverage from home)  Day 1 total 24-hour intake: 680 kcal (31% of minimum estimated needs)  16 grams of protein (14.5% of minimum estimated needs)  Nutrition Diagnosis: Moderate Malnutrition related to chronic illness (Crohn's) as evidenced by moderate fat depletion, moderate muscle depletion.  Goal: Patient will meet greater than or equal to 90% of their needs.  Intervention:  - Continue 48-hour calorie count - Continue Regular diet - Continue to have family bring in outside food - Continue oral supplementation of vitamin C, vitamin D, and vitamin B12 - Supplemental TPN per Pharmacy  Pt continues to NOT meet his nutritional needs via PO intake and has refused Cortrak placement for supplemental EN multiple times. Pt's PO intake has not improved despite multiple dietitians offering interventions and supplements. Pt has refused all oral nutrition supplements offered. Pt has been educated extensively on why nutrition is important for his recovery. It is highly unlikely  that pt will meet his recommended nutrition needs orally until he becomes motivated to do so. If nutrition is the only barrier to discharge, recommend discontinuing TPN.   Mertie Clause, MS, RD, LDN Inpatient Clinical Dietitian Please see AMiON for contact information.
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
## 2022-08-24 NOTE — Progress Notes (Signed)
  Inpatient Rehabilitation Admissions Coordinator   I continue to follow his progress as we await medical readiness to pursue CIR admit. I met with patient at bedside.  Ottie Glazier, RN, MSN Rehab Admissions Coordinator [phone] 08/24/2022 12:33 PM

## 2022-08-24 NOTE — Plan of Care (Signed)
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
## 2022-08-25 ENCOUNTER — Inpatient Hospital Stay (HOSPITAL_COMMUNITY): Payer: Medicaid Other

## 2022-08-25 LAB — BASIC METABOLIC PANEL
Anion gap: 8 (ref 5–15)
BUN: 13 mg/dL (ref 6–20)
CO2: 24 mmol/L (ref 22–32)
Calcium: 9 mg/dL (ref 8.9–10.3)
Chloride: 101 mmol/L (ref 98–111)
Creatinine, Ser: 0.47 mg/dL — ABNORMAL LOW (ref 0.61–1.24)
GFR, Estimated: >60 mL/min (ref >60)
Glucose, Bld: 92 mg/dL (ref 70–99)
Potassium: 4.3 mmol/L (ref 3.5–5.1)
Sodium: 133 mmol/L — ABNORMAL LOW (ref 135–145)

## 2022-08-25 LAB — CBC WITH DIFFERENTIAL/PLATELET
Abs Immature Granulocytes: 0.06 10*3/uL (ref 0.00–0.07)
Basophils Absolute: 0.1 10*3/uL (ref 0.0–0.1)
Basophils Relative: 1 %
Eosinophils Absolute: 0.2 10*3/uL (ref 0.0–0.5)
Eosinophils Relative: 1 %
HCT: 27.9 % — ABNORMAL LOW (ref 39.0–52.0)
Hemoglobin: 9.1 g/dL — ABNORMAL LOW (ref 13.0–17.0)
Immature Granulocytes: 1 %
Lymphocytes Relative: 11 %
Lymphs Abs: 1.3 10*3/uL (ref 0.7–4.0)
MCH: 27.7 pg (ref 26.0–34.0)
MCHC: 32.6 g/dL (ref 30.0–36.0)
MCV: 85.1 fL (ref 80.0–100.0)
Monocytes Absolute: 0.7 10*3/uL (ref 0.1–1.0)
Monocytes Relative: 6 %
Neutro Abs: 9.6 10*3/uL — ABNORMAL HIGH (ref 1.7–7.7)
Neutrophils Relative %: 80 %
Platelets: 588 10*3/uL — ABNORMAL HIGH (ref 150–400)
RBC: 3.28 MIL/uL — ABNORMAL LOW (ref 4.22–5.81)
RDW: 16.5 % — ABNORMAL HIGH (ref 11.5–15.5)
WBC: 11.9 10*3/uL — ABNORMAL HIGH (ref 4.0–10.5)
nRBC: 0 % (ref 0.0–0.2)

## 2022-08-25 LAB — MAGNESIUM: Magnesium: 1.5 mg/dL — ABNORMAL LOW (ref 1.7–2.4)

## 2022-08-25 LAB — PHOSPHORUS: Phosphorus: 4.5 mg/dL (ref 2.5–4.6)

## 2022-08-25 MED ORDER — MAGNESIUM SULFATE 4 GM/100ML IV SOLN
4.0000 g | Freq: Once | INTRAVENOUS | Status: AC
  Administered 2022-08-25: 4 g via INTRAVENOUS
  Filled 2022-08-25: qty 100

## 2022-08-25 MED ORDER — ADULT MULTIVITAMIN W/MINERALS CH
1.0000 | ORAL_TABLET | Freq: Every day | ORAL | Status: DC
  Administered 2022-08-26 – 2022-09-03 (×9): 1 via ORAL
  Filled 2022-08-25 (×9): qty 1

## 2022-08-25 MED ORDER — HYDROMORPHONE HCL 1 MG/ML IJ SOLN
0.5000 mg | Freq: Three times a day (TID) | INTRAMUSCULAR | Status: DC | PRN
  Administered 2022-08-25 – 2022-08-31 (×15): 0.5 mg via INTRAVENOUS
  Filled 2022-08-25 (×15): qty 0.5

## 2022-08-25 MED ORDER — DIPHENOXYLATE-ATROPINE 2.5-0.025 MG PO TABS
2.0000 | ORAL_TABLET | Freq: Four times a day (QID) | ORAL | Status: DC
  Administered 2022-08-25 – 2022-09-03 (×36): 2 via ORAL
  Filled 2022-08-25 (×37): qty 2

## 2022-08-25 MED ORDER — GABAPENTIN 400 MG PO CAPS
400.0000 mg | ORAL_CAPSULE | Freq: Three times a day (TID) | ORAL | Status: DC
  Administered 2022-08-25 – 2022-08-30 (×16): 400 mg via ORAL
  Filled 2022-08-25 (×16): qty 1

## 2022-08-25 MED ORDER — OXYCODONE HCL ER 15 MG PO T12A
15.0000 mg | EXTENDED_RELEASE_TABLET | Freq: Once | ORAL | Status: AC
  Administered 2022-08-25: 15 mg via ORAL
  Filled 2022-08-25: qty 1

## 2022-08-25 MED ORDER — OXYCODONE HCL ER 15 MG PO T12A
30.0000 mg | EXTENDED_RELEASE_TABLET | Freq: Two times a day (BID) | ORAL | Status: DC
  Administered 2022-08-25 – 2022-08-27 (×4): 30 mg via ORAL
  Filled 2022-08-25 (×4): qty 2

## 2022-08-25 MED ORDER — IOHEXOL 350 MG/ML SOLN
75.0000 mL | Freq: Once | INTRAVENOUS | Status: AC | PRN
  Administered 2022-08-25: 75 mL via INTRAVENOUS
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
## 2022-08-25 NOTE — Progress Notes (Signed)
Pt transported off unit to CT. P. Amo Aceson Labell RN 
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
## 2022-08-25 NOTE — Consult Note (Signed)
WOC Nurse ostomy follow up Ostomy pouch which was applied 4/29 is intact with good seal, large amt brown green stool in bedside drainage bag. Supplies  at bedside for staff nurses use. Pt will need crusting with ostomy powder and skin prep to protect around stoma each time.  4 sets of supplies ordered to the bedside; barrier rings, [place] # [phone], wafers [employer] # [phone], high output pouches [place] # [id number]. WOC team will continue to follow while in the hospital. Thank-you,  Cammie Mcgee MSN, RN, CWOCN, [place], CNS [phone]

## 2022-08-25 NOTE — Progress Notes (Addendum)
Calorie Count Note: Day 2 Results  48-hour calorie count ordered 08/23/22 at 1001. Please see day 2 results below. Of note, pt did not eat breakfast at all yesterday.  Spoke with pt at bedside. Pt reports that he has not yet eaten breakfast today. Pt asked RD to heat up his leftover [employer] pizza from last night. Pt states that his pizza sat out at room temperature overnight and was not refrigerated. RD did not heat up leftovers due to food safety risk. Attempted to throw away leftover pizza and leftover [employer] containers (grilled chicken and bread) that had been sitting out overnight but pt refused. RD offered to get some items for pt to make toast with his gluten-free bread, but pt declined peanut butter due to it "causing blockages." Pt became frustrated with RD at this point and was no longer participating in conversation. As RD was leaving pt's room, pt was calling in NT to heat up his leftover pizza. RD discussed with NT that this was a food safety risk.  Diet: regular, thin liquids Supplements: N/A (pt has declined all available oral nutrition supplements offered)  Day 2: 4/30 Lunch: 342 kcal, 11 grams of protein (chicken salad from hospital kitchen on 2 pieces of gluten-free bread) 4/30 Dinner: 550 kcal, 12 grams of protein (2 pieces [30%] of a 10-inch vegan cheese pizza on gluten-free crust with [place] pepperoni added from [employer] + 4-5 plain chicken wings from [employer]) 5/01 Breakfast: 0 kcal, 0 grams of protein (has not eaten breakfast as of 0930 today)  Day 2 total 24-hour intake: 892 kcal (40.5% of minimum estimated needs)  23 grams of protein (21% of minimum estimated needs)  Nutrition Diagnosis: Moderate Malnutrition related to chronic illness (Crohn's) as evidenced by moderate fat depletion, moderate muscle depletion.  Goal: Patient will meet greater than or equal to 90% of their needs.  Intervention:  - d/c 48-hour calorie count - Continue Regular diet -  Continue to have family bring in outside food - Continue oral supplementation of vitamin C, vitamin D, and vitamin B12 - Supplemental TPN per Pharmacy   Mertie Clause, MS, RD, LDN Inpatient Clinical Dietitian Please see AMiON for contact information.

## 2022-08-26 ENCOUNTER — Encounter (HOSPITAL_COMMUNITY): Admission: EM | Disposition: A | Discharge status: Rehabilitation Facility | Payer: Self-pay | Source: Home / Self Care

## 2022-08-26 LAB — CBC WITH DIFFERENTIAL/PLATELET
Abs Immature Granulocytes: 0.09 10*3/uL — ABNORMAL HIGH (ref 0.00–0.07)
Basophils Absolute: 0.1 10*3/uL (ref 0.0–0.1)
Basophils Relative: 0 %
Eosinophils Absolute: 0.1 10*3/uL (ref 0.0–0.5)
Eosinophils Relative: 1 %
HCT: 29.9 % — ABNORMAL LOW (ref 39.0–52.0)
Hemoglobin: 9.8 g/dL — ABNORMAL LOW (ref 13.0–17.0)
Immature Granulocytes: 1 %
Lymphocytes Relative: 9 %
Lymphs Abs: 1.3 10*3/uL (ref 0.7–4.0)
MCH: 27.8 pg (ref 26.0–34.0)
MCHC: 32.8 g/dL (ref 30.0–36.0)
MCV: 84.7 fL (ref 80.0–100.0)
Monocytes Absolute: 0.9 10*3/uL (ref 0.1–1.0)
Monocytes Relative: 6 %
Neutro Abs: 12.8 10*3/uL — ABNORMAL HIGH (ref 1.7–7.7)
Neutrophils Relative %: 83 %
Platelets: 614 10*3/uL — ABNORMAL HIGH (ref 150–400)
RBC: 3.53 MIL/uL — ABNORMAL LOW (ref 4.22–5.81)
RDW: 16.8 % — ABNORMAL HIGH (ref 11.5–15.5)
WBC: 15.3 10*3/uL — ABNORMAL HIGH (ref 4.0–10.5)
nRBC: 0 % (ref 0.0–0.2)

## 2022-08-26 LAB — URINALYSIS, COMPLETE (UACMP) WITH MICROSCOPIC
Bacteria, UA: NONE SEEN
Bilirubin Urine: NEGATIVE
Glucose, UA: NEGATIVE mg/dL
Hgb urine dipstick: NEGATIVE
Ketones, ur: 5 mg/dL — AB
Leukocytes,Ua: NEGATIVE
Nitrite: NEGATIVE
Protein, ur: NEGATIVE mg/dL
Specific Gravity, Urine: 1.024 (ref 1.005–1.030)
pH: 5 (ref 5.0–8.0)

## 2022-08-26 LAB — BASIC METABOLIC PANEL
Anion gap: 7 (ref 5–15)
BUN: 12 mg/dL (ref 6–20)
CO2: 23 mmol/L (ref 22–32)
Calcium: 9.1 mg/dL (ref 8.9–10.3)
Chloride: 102 mmol/L (ref 98–111)
Creatinine, Ser: 0.53 mg/dL — ABNORMAL LOW (ref 0.61–1.24)
GFR, Estimated: >60 mL/min (ref >60)
Glucose, Bld: 79 mg/dL (ref 70–99)
Potassium: 3.9 mmol/L (ref 3.5–5.1)
Sodium: 132 mmol/L — ABNORMAL LOW (ref 135–145)

## 2022-08-26 LAB — APTT
aPTT: 62 seconds — ABNORMAL HIGH (ref 24–36)
aPTT: 83 seconds — ABNORMAL HIGH (ref 24–36)

## 2022-08-26 SURGERY — LUMBAR LAMINECTOMY/ DECOMPRESSION WITH MET-RX
Anesthesia: General

## 2022-08-26 MED ORDER — PIPERACILLIN-TAZOBACTAM 3.375 G IVPB
3.3750 g | Freq: Three times a day (TID) | INTRAVENOUS | Status: DC
  Administered 2022-08-26 – 2022-09-03 (×24): 3.375 g via INTRAVENOUS
  Filled 2022-08-26 (×24): qty 50

## 2022-08-26 MED ORDER — HEPARIN (PORCINE) 25000 UT/250ML-% IV SOLN
1200.0000 [IU]/h | INTRAVENOUS | Status: DC
  Administered 2022-08-26: 1200 [IU]/h via INTRAVENOUS
  Filled 2022-08-26: qty 250

## 2022-08-26 MED ORDER — PIPERACILLIN-TAZOBACTAM 3.375 G IVPB 30 MIN: 3.3750 g | Freq: Three times a day (TID) | INTRAVENOUS | Status: DC

## 2022-08-26 MED ORDER — HEPARIN (PORCINE) 25000 UT/250ML-% IV SOLN
1450.0000 [IU]/h | INTRAVENOUS | Status: AC
  Administered 2022-08-27: 1300 [IU]/h via INTRAVENOUS
  Administered 2022-08-27: 1400 [IU]/h via INTRAVENOUS
  Administered 2022-08-28 – 2022-08-29 (×2): 1450 [IU]/h via INTRAVENOUS
  Filled 2022-08-26 (×5): qty 250
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
## 2022-08-27 LAB — HEPARIN LEVEL (UNFRACTIONATED)
Heparin Unfractionated: 0.23 IU/mL — ABNORMAL LOW (ref 0.30–0.70)
Heparin Unfractionated: 0.45 IU/mL (ref 0.30–0.70)

## 2022-08-27 LAB — CBC
HCT: 29.8 % — ABNORMAL LOW (ref 39.0–52.0)
Hemoglobin: 9.6 g/dL — ABNORMAL LOW (ref 13.0–17.0)
MCH: 27.1 pg (ref 26.0–34.0)
MCHC: 32.2 g/dL (ref 30.0–36.0)
MCV: 84.2 fL (ref 80.0–100.0)
Platelets: 642 10*3/uL — ABNORMAL HIGH (ref 150–400)
RBC: 3.54 MIL/uL — ABNORMAL LOW (ref 4.22–5.81)
RDW: 16 % — ABNORMAL HIGH (ref 11.5–15.5)
WBC: 15 10*3/uL — ABNORMAL HIGH (ref 4.0–10.5)
nRBC: 0 % (ref 0.0–0.2)

## 2022-08-27 LAB — APTT: aPTT: 67 seconds — ABNORMAL HIGH (ref 24–36)

## 2022-08-27 MED ORDER — OXYCODONE HCL ER 15 MG PO T12A
15.0000 mg | EXTENDED_RELEASE_TABLET | Freq: Once | ORAL | Status: AC
  Administered 2022-08-27: 15 mg via ORAL
  Filled 2022-08-27: qty 1

## 2022-08-27 MED ORDER — OXYCODONE HCL ER 15 MG PO T12A
45.0000 mg | EXTENDED_RELEASE_TABLET | Freq: Two times a day (BID) | ORAL | Status: DC
  Administered 2022-08-27 – 2022-08-30 (×6): 45 mg via ORAL
  Filled 2022-08-27 (×6): qty 3
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
## 2022-08-27 NOTE — Progress Notes (Signed)
Inpatient Rehabilitation Admissions Coordinator   Noted pending additional surgery. We continue to follow at a distance.  Ottie Glazier, RN, MSN Rehab Admissions Coordinator [phone] 08/27/2022 11:19 AM
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
## 2022-08-28 LAB — CBC
HCT: 26.6 % — ABNORMAL LOW (ref 39.0–52.0)
Hemoglobin: 8.9 g/dL — ABNORMAL LOW (ref 13.0–17.0)
MCH: 27.6 pg (ref 26.0–34.0)
MCHC: 33.5 g/dL (ref 30.0–36.0)
MCV: 82.4 fL (ref 80.0–100.0)
Platelets: 556 10*3/uL — ABNORMAL HIGH (ref 150–400)
RBC: 3.23 MIL/uL — ABNORMAL LOW (ref 4.22–5.81)
RDW: 15.9 % — ABNORMAL HIGH (ref 11.5–15.5)
WBC: 8.4 10*3/uL (ref 4.0–10.5)
nRBC: 0 % (ref 0.0–0.2)

## 2022-08-28 LAB — BASIC METABOLIC PANEL
Anion gap: 8 (ref 5–15)
BUN: 10 mg/dL (ref 6–20)
CO2: 24 mmol/L (ref 22–32)
Calcium: 9 mg/dL (ref 8.9–10.3)
Chloride: 99 mmol/L (ref 98–111)
Creatinine, Ser: 0.51 mg/dL — ABNORMAL LOW (ref 0.61–1.24)
GFR, Estimated: >60 mL/min (ref >60)
Glucose, Bld: 91 mg/dL (ref 70–99)
Potassium: 3.2 mmol/L — ABNORMAL LOW (ref 3.5–5.1)
Sodium: 131 mmol/L — ABNORMAL LOW (ref 135–145)

## 2022-08-28 LAB — HEPARIN LEVEL (UNFRACTIONATED)
Heparin Unfractionated: 0.4 IU/mL (ref 0.30–0.70)
Heparin Unfractionated: 0.32 IU/mL (ref 0.30–0.70)
Heparin Unfractionated: 0.6 IU/mL (ref 0.30–0.70)
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
## 2022-08-29 LAB — CBC
HCT: 28 % — ABNORMAL LOW (ref 39.0–52.0)
Hemoglobin: 9.4 g/dL — ABNORMAL LOW (ref 13.0–17.0)
MCH: 27.4 pg (ref 26.0–34.0)
MCHC: 33.6 g/dL (ref 30.0–36.0)
MCV: 81.6 fL (ref 80.0–100.0)
Platelets: 605 10*3/uL — ABNORMAL HIGH (ref 150–400)
RBC: 3.43 MIL/uL — ABNORMAL LOW (ref 4.22–5.81)
RDW: 15.9 % — ABNORMAL HIGH (ref 11.5–15.5)
WBC: 9.3 10*3/uL (ref 4.0–10.5)
nRBC: 0 % (ref 0.0–0.2)

## 2022-08-29 LAB — HEPARIN LEVEL (UNFRACTIONATED): Heparin Unfractionated: 0.45 IU/mL (ref 0.30–0.70)

## 2022-08-29 MED ORDER — POTASSIUM CHLORIDE 20 MEQ PO PACK
40.0000 meq | PACK | Freq: Once | ORAL | Status: AC
  Administered 2022-08-29: 40 meq via ORAL
  Filled 2022-08-29: qty 2
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
## 2022-08-30 ENCOUNTER — Encounter (HOSPITAL_COMMUNITY): Admission: EM | Disposition: A | Discharge status: Rehabilitation Facility | Payer: Self-pay | Source: Home / Self Care

## 2022-08-30 ENCOUNTER — Inpatient Hospital Stay (HOSPITAL_COMMUNITY): Payer: Medicaid Other

## 2022-08-30 ENCOUNTER — Inpatient Hospital Stay (HOSPITAL_COMMUNITY): Payer: Medicaid Other | Admitting: Anesthesiology

## 2022-08-30 ENCOUNTER — Encounter (HOSPITAL_COMMUNITY): Payer: Self-pay

## 2022-08-30 DIAGNOSIS — S34104S Unspecified injury to L4 level of lumbar spinal cord, sequela: Secondary | ICD-10-CM

## 2022-08-30 DIAGNOSIS — D649 Anemia, unspecified: Secondary | ICD-10-CM

## 2022-08-30 HISTORY — PX: LIPOMA EXCISION: SHX5283

## 2022-08-30 HISTORY — PX: LUMBAR LAMINECTOMY/ DECOMPRESSION WITH MET-RX: SHX5959

## 2022-08-30 LAB — COMPREHENSIVE METABOLIC PANEL
ALT: 25 U/L (ref 0–44)
AST: 12 U/L — ABNORMAL LOW (ref 15–41)
Albumin: 2 g/dL — ABNORMAL LOW (ref 3.5–5.0)
Alkaline Phosphatase: 122 U/L (ref 38–126)
Anion gap: 9 (ref 5–15)
BUN: 8 mg/dL (ref 6–20)
CO2: 25 mmol/L (ref 22–32)
Calcium: 9.2 mg/dL (ref 8.9–10.3)
Chloride: 98 mmol/L (ref 98–111)
Creatinine, Ser: 0.51 mg/dL — ABNORMAL LOW (ref 0.61–1.24)
GFR, Estimated: >60 mL/min (ref >60)
Glucose, Bld: 89 mg/dL (ref 70–99)
Potassium: 3.7 mmol/L (ref 3.5–5.1)
Sodium: 132 mmol/L — ABNORMAL LOW (ref 135–145)
Total Bilirubin: 0.4 mg/dL (ref 0.3–1.2)
Total Protein: 7 g/dL (ref 6.5–8.1)

## 2022-08-30 LAB — CBC
HCT: 29 % — ABNORMAL LOW (ref 39.0–52.0)
Hemoglobin: 9.4 g/dL — ABNORMAL LOW (ref 13.0–17.0)
MCH: 27.1 pg (ref 26.0–34.0)
MCHC: 32.4 g/dL (ref 30.0–36.0)
MCV: 83.6 fL (ref 80.0–100.0)
Platelets: 555 10*3/uL — ABNORMAL HIGH (ref 150–400)
RBC: 3.47 MIL/uL — ABNORMAL LOW (ref 4.22–5.81)
RDW: 16 % — ABNORMAL HIGH (ref 11.5–15.5)
WBC: 6.1 10*3/uL (ref 4.0–10.5)
nRBC: 0 % (ref 0.0–0.2)

## 2022-08-30 LAB — HEPARIN LEVEL (UNFRACTIONATED): Heparin Unfractionated: 0.7 IU/mL (ref 0.30–0.70)

## 2022-08-30 LAB — SURGICAL PCR SCREEN
MRSA, PCR: NEGATIVE
Staphylococcus aureus: NEGATIVE

## 2022-08-30 SURGERY — LUMBAR LAMINECTOMY/ DECOMPRESSION WITH MET-RX
Anesthesia: General | Site: Abdomen

## 2022-08-30 MED ORDER — FENTANYL CITRATE (PF) 250 MCG/5ML IJ SOLN
INTRAMUSCULAR | Status: AC
  Filled 2022-08-30: qty 5

## 2022-08-30 MED ORDER — PROPOFOL 10 MG/ML IV BOLUS
INTRAVENOUS | Status: AC
  Filled 2022-08-30: qty 20

## 2022-08-30 MED ORDER — DEXAMETHASONE SODIUM PHOSPHATE 10 MG/ML IJ SOLN
INTRAMUSCULAR | Status: DC | PRN
  Administered 2022-08-30: 5 mg via INTRAVENOUS

## 2022-08-30 MED ORDER — PHENYLEPHRINE 80 MCG/ML (10ML) SYRINGE FOR IV PUSH (FOR BLOOD PRESSURE SUPPORT)
PREFILLED_SYRINGE | INTRAVENOUS | Status: DC | PRN
  Administered 2022-08-30 (×2): 80 ug via INTRAVENOUS

## 2022-08-30 MED ORDER — PROPOFOL 10 MG/ML IV BOLUS
INTRAVENOUS | Status: DC | PRN
  Administered 2022-08-30: 110 mg via INTRAVENOUS

## 2022-08-30 MED ORDER — ORAL CARE MOUTH RINSE: 15.0000 mL | Freq: Once | OROMUCOSAL | Status: AC

## 2022-08-30 MED ORDER — OXYCODONE HCL 5 MG/5ML PO SOLN: 5.0000 mg | Freq: Once | ORAL | Status: DC | PRN

## 2022-08-30 MED ORDER — ADHERUS DURAL SEALANT
PACK | TOPICAL | Status: DC | PRN
  Administered 2022-08-30: 1

## 2022-08-30 MED ORDER — FENTANYL CITRATE (PF) 100 MCG/2ML IJ SOLN
25.0000 ug | INTRAMUSCULAR | Status: DC | PRN
  Administered 2022-08-30: 50 ug via INTRAVENOUS

## 2022-08-30 MED ORDER — ONDANSETRON HCL 4 MG/2ML IJ SOLN
INTRAMUSCULAR | Status: DC | PRN
  Administered 2022-08-30: 4 mg via INTRAVENOUS

## 2022-08-30 MED ORDER — MIDAZOLAM HCL 2 MG/2ML IJ SOLN
INTRAMUSCULAR | Status: DC | PRN
  Administered 2022-08-30: 2 mg via INTRAVENOUS

## 2022-08-30 MED ORDER — ONDANSETRON HCL 4 MG/2ML IJ SOLN
INTRAMUSCULAR | Status: AC
  Filled 2022-08-30: qty 2

## 2022-08-30 MED ORDER — LIDOCAINE-EPINEPHRINE 1 %-1:100000 IJ SOLN
INTRAMUSCULAR | Status: AC
  Filled 2022-08-30: qty 1

## 2022-08-30 MED ORDER — ROCURONIUM BROMIDE 10 MG/ML (PF) SYRINGE
PREFILLED_SYRINGE | INTRAVENOUS | Status: DC | PRN
  Administered 2022-08-30 (×2): 40 mg via INTRAVENOUS
  Administered 2022-08-30: 60 mg via INTRAVENOUS
  Administered 2022-08-30: 20 mg via INTRAVENOUS

## 2022-08-30 MED ORDER — FENTANYL CITRATE (PF) 100 MCG/2ML IJ SOLN
INTRAMUSCULAR | Status: AC
  Filled 2022-08-30: qty 2

## 2022-08-30 MED ORDER — PROMETHAZINE HCL 25 MG/ML IJ SOLN: 6.2500 mg | INTRAMUSCULAR | Status: DC | PRN

## 2022-08-30 MED ORDER — THROMBIN 5000 UNITS EX SOLR
CUTANEOUS | Status: AC
  Filled 2022-08-30: qty 5000

## 2022-08-30 MED ORDER — OXYCODONE HCL 5 MG PO TABS: 5.0000 mg | ORAL_TABLET | Freq: Once | ORAL | Status: DC | PRN

## 2022-08-30 MED ORDER — GABAPENTIN 400 MG PO CAPS
400.0000 mg | ORAL_CAPSULE | Freq: Once | ORAL | Status: AC
  Administered 2022-08-30: 400 mg via ORAL
  Filled 2022-08-30: qty 4

## 2022-08-30 MED ORDER — LACTATED RINGERS IV SOLN: INTRAVENOUS | Status: DC

## 2022-08-30 MED ORDER — 0.9 % SODIUM CHLORIDE (POUR BTL) OPTIME
TOPICAL | Status: DC | PRN
  Administered 2022-08-30: 1000 mL

## 2022-08-30 MED ORDER — THROMBIN 5000 UNITS EX SOLR: OROMUCOSAL | Status: DC | PRN

## 2022-08-30 MED ORDER — MIDAZOLAM HCL 2 MG/2ML IJ SOLN
INTRAMUSCULAR | Status: AC
  Filled 2022-08-30: qty 2

## 2022-08-30 MED ORDER — CHLORHEXIDINE GLUCONATE 0.12 % MT SOLN
15.0000 mL | Freq: Once | OROMUCOSAL | Status: AC
  Administered 2022-08-30: 15 mL via OROMUCOSAL
  Filled 2022-08-30: qty 15

## 2022-08-30 MED ORDER — LIDOCAINE-EPINEPHRINE 1 %-1:100000 IJ SOLN
INTRAMUSCULAR | Status: DC | PRN
  Administered 2022-08-30: 10 mL

## 2022-08-30 MED ORDER — ACETAMINOPHEN 500 MG PO TABS
1000.0000 mg | ORAL_TABLET | Freq: Once | ORAL | Status: AC
  Administered 2022-08-30: 1000 mg via ORAL
  Filled 2022-08-30: qty 2

## 2022-08-30 MED ORDER — LIDOCAINE 2% (20 MG/ML) 5 ML SYRINGE
INTRAMUSCULAR | Status: DC | PRN
  Administered 2022-08-30: 40 mg via INTRAVENOUS

## 2022-08-30 MED ORDER — SUGAMMADEX SODIUM 200 MG/2ML IV SOLN
INTRAVENOUS | Status: DC | PRN
  Administered 2022-08-30: 200 mg via INTRAVENOUS

## 2022-08-30 MED ORDER — FENTANYL CITRATE (PF) 250 MCG/5ML IJ SOLN
INTRAMUSCULAR | Status: DC | PRN
  Administered 2022-08-30: 50 ug via INTRAVENOUS
  Administered 2022-08-30: 100 ug via INTRAVENOUS

## 2022-08-30 SURGICAL SUPPLY — 56 items
ADH SKN CLS APL DERMABOND .7 (GAUZE/BANDAGES/DRESSINGS) ×2
BAG COUNTER SPONGE SURGICOUNT (BAG) ×2 IMPLANT
BAG SPNG CNTER NS LX DISP (BAG) ×2
BAND INSRT 18 STRL LF DISP RB (MISCELLANEOUS) ×4
BAND RUBBER #18 3X1/16 STRL (MISCELLANEOUS) ×4 IMPLANT
BLADE CLIPPER SURG (BLADE) IMPLANT
BLADE SURG 11 STRL SS (BLADE) ×2 IMPLANT
BUR SURG IBUR 4X12.5 (BURR) IMPLANT
BUR SURGICAL HEAD PROX 3X12.5 (BURR) ×2 IMPLANT
BURR SURG IBUR 4X12.5 (BURR)
BURR SURGICAL HEAD PROX 3X12.5 (BURR) ×2
CANISTER SUCT 3000ML PPV (MISCELLANEOUS) ×2 IMPLANT
DERMABOND ADVANCED .7 DNX12 (GAUZE/BANDAGES/DRESSINGS) ×2 IMPLANT
DRAPE C-ARM 42X72 X-RAY (DRAPES) ×4 IMPLANT
DRAPE LAPAROTOMY 100X72X124 (DRAPES) ×2 IMPLANT
DRAPE MICROSCOPE SLANT 54X150 (MISCELLANEOUS) ×2 IMPLANT
DRAPE SURG 17X23 STRL (DRAPES) ×2 IMPLANT
DURAPREP 26ML APPLICATOR (WOUND CARE) ×2 IMPLANT
ELECT BLADE 6.5 EXT (BLADE) ×2 IMPLANT
ELECT REM PT RETURN 9FT ADLT (ELECTROSURGICAL) ×2
ELECTRODE REM PT RTRN 9FT ADLT (ELECTROSURGICAL) ×2 IMPLANT
GAUZE 4X4 16PLY ~~LOC~~+RFID DBL (SPONGE) IMPLANT
GAUZE SPONGE 4X4 12PLY STRL (GAUZE/BANDAGES/DRESSINGS) IMPLANT
GLOVE BIOGEL PI IND STRL 7.5 (GLOVE) ×2 IMPLANT
GLOVE ECLIPSE 7.5 STRL STRAW (GLOVE) ×2 IMPLANT
GLOVE EXAM NITRILE LRG STRL (GLOVE) IMPLANT
GLOVE EXAM NITRILE XL STR (GLOVE) IMPLANT
GLOVE EXAM NITRILE XS STR PU (GLOVE) IMPLANT
GOWN STRL REUS W/ TWL LRG LVL3 (GOWN DISPOSABLE) ×4 IMPLANT
GOWN STRL REUS W/ TWL XL LVL3 (GOWN DISPOSABLE) IMPLANT
GOWN STRL REUS W/TWL 2XL LVL3 (GOWN DISPOSABLE) IMPLANT
GOWN STRL REUS W/TWL LRG LVL3 (GOWN DISPOSABLE) ×4
GOWN STRL REUS W/TWL XL LVL3 (GOWN DISPOSABLE)
HEMOSTAT POWDER KIT SURGIFOAM (HEMOSTASIS) ×2 IMPLANT
KIT BASIN OR (CUSTOM PROCEDURE TRAY) ×2 IMPLANT
KIT TURNOVER KIT B (KITS) ×2 IMPLANT
NDL SPNL 18GX3.5 QUINCKE PK (NEEDLE) ×2 IMPLANT
NEEDLE HYPO 22GX1.5 SAFETY (NEEDLE) ×2 IMPLANT
NEEDLE SPNL 18GX3.5 QUINCKE PK (NEEDLE) ×2 IMPLANT
NS IRRIG 1000ML POUR BTL (IV SOLUTION) ×2 IMPLANT
PACK LAMINECTOMY NEURO (CUSTOM PROCEDURE TRAY) ×2 IMPLANT
PAD ARMBOARD 7.5X6 YLW CONV (MISCELLANEOUS) ×6 IMPLANT
SEALANT ADHERUS EXTEND TIP (MISCELLANEOUS) IMPLANT
SOL ELECTROSURG ANTI STICK (MISCELLANEOUS)
SOLUTION ELECTROSURG ANTI STCK (MISCELLANEOUS) ×2 IMPLANT
SPIKE FLUID TRANSFER (MISCELLANEOUS) ×2 IMPLANT
SPONGE T-LAP 4X18 ~~LOC~~+RFID (SPONGE) IMPLANT
SUT ETHILON 2 0 FS 18 (SUTURE) IMPLANT
SUT ETHILON 3 0 FSL (SUTURE) IMPLANT
SUT MNCRL AB 3-0 PS2 18 (SUTURE) IMPLANT
SUT VIC AB 0 CT1 18XCR BRD8 (SUTURE) IMPLANT
SUT VIC AB 0 CT1 8-18 (SUTURE)
SUT VIC AB 2-0 CP2 18 (SUTURE) ×2 IMPLANT
TOWEL GREEN STERILE (TOWEL DISPOSABLE) ×2 IMPLANT
TOWEL GREEN STERILE FF (TOWEL DISPOSABLE) ×2 IMPLANT
WATER STERILE IRR 1000ML POUR (IV SOLUTION) ×2 IMPLANT

## 2022-08-30 NOTE — Progress Notes (Signed)
Pt PICC line removed per protocol, catheter tip intact with no discharge noted, pt denies any discomfort or SOB. VSS, sterile Vaseline gauze dressing applied. Pt in bed with call light within reach. PLeotis Pain Tesean Stump RN   08/30/22 1500  Vitals  Temp 98.2 F (36.8 C)  Temp Source Oral  BP 104/73  MAP (mmHg) 83  BP Location Right Arm  BP Method Automatic  Patient Position (if appropriate) Lying  Pulse Rate Source Monitor  ECG Heart Rate 83  Resp 16  MEWS COLOR  MEWS Score Color Green  Oxygen Therapy  SpO2 96 %  O2 Device Room Air  MEWS Score  MEWS Temp 0  MEWS Systolic 0  MEWS Pulse 0  MEWS RR 0  MEWS LOC 1  MEWS Score 1
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
## 2022-08-30 NOTE — Anesthesia Procedure Notes (Addendum)
Procedure Name: Intubation Date/Time: 08/30/2022 10:55 AM  Performed by: Randon Goldsmith, CRNAPre-anesthesia Checklist: Patient identified, Emergency Drugs available, Suction available and Patient being monitored Patient Re-evaluated:Patient Re-evaluated prior to induction Oxygen Delivery Method: Circle system utilized Preoxygenation: Pre-oxygenation with 100% oxygen Induction Type: IV induction Ventilation: Mask ventilation without difficulty Laryngoscope Size: Mac and 4 Grade View: Grade I Tube type: Oral Tube size: 7.5 mm Number of attempts: 1 Airway Equipment and Method: Stylet Placement Confirmation: ETT inserted through vocal cords under direct vision, positive ETCO2, CO2 detector and breath sounds checked- equal and bilateral Secured at: 23 cm Tube secured with: Tape Dental Injury: Teeth and Oropharynx as per pre-operative assessment  Comments: Performed by Earleen Reaper, SRNA under direct supervision by Dr. Mal Amabile and Randon Goldsmith, CRNA
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
## 2022-08-30 NOTE — Progress Notes (Signed)
PT Cancellation Note  Patient Details Name: Shane Klein MRN: [id number] DOB: May 20, 1990   Cancelled Treatment:    Reason Eval/Treat Not Completed: Patient at procedure or test/unavailable; patient for OR today due to spinal abscess.  Will reassess following surgery.   Elray Mcgregor 08/30/2022, 9:03 AM Sheran Lawless, PT Acute Rehabilitation Services Office:[phone] 08/30/2022

## 2022-08-30 NOTE — Progress Notes (Signed)
Pt going to OR for surgery and report called off to Short stay. Dionne Bucy RN

## 2022-08-30 NOTE — Progress Notes (Signed)
Pt back to unit from pacu. VSS, telemetry applied, back incision remains intact open to air with sutures and dermabond intact. No active bleeding or discharge noted. Pt in bed with call light within reach and mother at bedside. Will continue to closely monitor. Dionne Bucy RN   08/30/22 1402  Vitals  Temp 98.4 F (36.9 C)  Temp Source Oral  BP 109/82  MAP (mmHg) 90  BP Location Right Arm  BP Method Automatic  Patient Position (if appropriate) Lying  Pulse Rate 83  Pulse Rate Source Monitor  Resp 18  MEWS COLOR  MEWS Score Color Green  Oxygen Therapy  SpO2 95 %  O2 Device Room Air  MEWS Score  MEWS Temp 0  MEWS Systolic 0  MEWS Pulse 0  MEWS RR 0  MEWS LOC 1  MEWS Score 1

## 2022-08-30 NOTE — Progress Notes (Signed)
PICC removed by Floor RN

## 2022-08-30 NOTE — Progress Notes (Signed)
OT Cancellation Note  Patient Details Name: Shane Klein MRN: [id number] DOB: 1990-07-18   Cancelled Treatment:    Reason Eval/Treat Not Completed: Patient at procedure or test/ unavailable Patient being taken down for surgery at time of OT entry. OT will follow back as time permits.   Pollyann Glen E. Kyandra Mcclaine, OTR/L Acute Rehabilitation Services [phone]   Cherlyn Cushing 08/30/2022, 9:08 AM

## 2022-08-30 NOTE — Anesthesia Postprocedure Evaluation (Signed)
Anesthesia Post Note  Patient: Jailin K Callas  Procedure(s) Performed: Lumbar Four-Lumbar Five Laminectomy, Evacuation of Abscess, Abdominal Drain Stitch Abdominal stitch to drain (Left: Abdomen)     Patient location during evaluation: PACU Anesthesia Type: General Level of consciousness: awake and alert Pain management: pain level controlled Vital Signs Assessment: post-procedure vital signs reviewed and stable Respiratory status: spontaneous breathing, nonlabored ventilation and respiratory function stable Cardiovascular status: stable and blood pressure returned to baseline Anesthetic complications: no   No notable events documented.  Last Vitals:  Vitals:   08/30/22 1339 08/30/22 1402  BP: 103/76 109/82  Pulse: 83 83  Resp: 16 18  Temp: 36.6 C 36.9 C  SpO2: 100% 95%    Last Pain:  Vitals:   08/30/22 1402  TempSrc: Oral  PainSc:                  Beryle Lathe

## 2022-08-30 NOTE — Transfer of Care (Signed)
Immediate Anesthesia Transfer of Care Note  Patient: Shane Klein  Procedure(s) Performed: Lumbar Four-Lumbar Five Laminectomy, Evacuation of Abscess, Abdominal Drain Stitch EXCISION LIPOMA  Patient Location: PACU  Anesthesia Type:General  Level of Consciousness: drowsy  Airway & Oxygen Therapy: Patient Spontanous Breathing and Patient connected to face mask oxygen  Post-op Assessment: Report given to RN and Post -op Vital signs reviewed and stable  Post vital signs: Reviewed and stable  Last Vitals:  Vitals Value Taken Time  BP 128/92 08/30/22 1309  Temp    Pulse 92   Resp 14 08/30/22 1311  SpO2 99   Vitals shown include unvalidated device data.  Last Pain:  Vitals:   08/30/22 0937  TempSrc: Oral  PainSc:       Patients Stated Pain Goal: 0 (08/30/22 0811)  Complications: No notable events documented.

## 2022-08-30 NOTE — Progress Notes (Signed)
Inpatient Rehab Admissions Coordinator:    CIR following for potential admit pending medical readiness. Pt. Is not ready at this time, to OR today with NSGY.   Megan Salon, MS, CCC-SLP Rehab Admissions Coordinator  [phone] (celll) [phone] (office)
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
## 2022-08-31 ENCOUNTER — Other Ambulatory Visit: Payer: Self-pay

## 2022-08-31 ENCOUNTER — Inpatient Hospital Stay (HOSPITAL_COMMUNITY): Payer: Medicaid Other

## 2022-08-31 ENCOUNTER — Encounter (HOSPITAL_COMMUNITY): Payer: Self-pay | Admitting: Neurological Surgery

## 2022-08-31 MED ORDER — IOHEXOL 350 MG/ML SOLN
75.0000 mL | Freq: Once | INTRAVENOUS | Status: AC | PRN
  Administered 2022-08-31: 75 mL via INTRAVENOUS

## 2022-08-31 MED ORDER — CALCIUM POLYCARBOPHIL 625 MG PO TABS
1250.0000 mg | ORAL_TABLET | Freq: Two times a day (BID) | ORAL | Status: DC
  Administered 2022-08-31 – 2022-09-03 (×7): 1250 mg via ORAL
  Filled 2022-08-31 (×7): qty 2

## 2022-08-31 MED ORDER — HYDROMORPHONE HCL 1 MG/ML IJ SOLN
0.2500 mg | Freq: Three times a day (TID) | INTRAMUSCULAR | Status: DC | PRN
  Administered 2022-08-31 – 2022-09-01 (×2): 0.25 mg via INTRAVENOUS
  Filled 2022-08-31 (×2): qty 0.5

## 2022-08-31 MED ORDER — GABAPENTIN 300 MG PO CAPS
600.0000 mg | ORAL_CAPSULE | Freq: Three times a day (TID) | ORAL | Status: DC
  Administered 2022-08-31 – 2022-09-03 (×10): 600 mg via ORAL
  Filled 2022-08-31 (×10): qty 2

## 2022-08-31 MED ORDER — OXYCODONE HCL ER 15 MG PO T12A
60.0000 mg | EXTENDED_RELEASE_TABLET | Freq: Two times a day (BID) | ORAL | Status: DC
  Administered 2022-08-31 – 2022-09-03 (×7): 60 mg via ORAL
  Filled 2022-08-31 (×7): qty 4

## 2022-08-31 MED ORDER — MELATONIN 3 MG PO TABS
3.0000 mg | ORAL_TABLET | Freq: Every day | ORAL | Status: DC
  Administered 2022-08-31 – 2022-09-02 (×3): 3 mg via ORAL
  Filled 2022-08-31 (×3): qty 1

## 2022-08-31 MED ORDER — HYDROMORPHONE HCL 1 MG/ML IJ SOLN
1.0000 mg | Freq: Once | INTRAMUSCULAR | Status: AC
  Administered 2022-08-31: 1 mg via INTRAVENOUS
  Filled 2022-08-31: qty 1

## 2022-08-31 MED ORDER — HEPARIN SODIUM (PORCINE) 5000 UNIT/ML IJ SOLN
5000.0000 [IU] | Freq: Three times a day (TID) | INTRAMUSCULAR | Status: DC
  Filled 2022-08-31 (×2): qty 1

## 2022-08-31 MED ORDER — DAKINS (1/4 STRENGTH) 0.125 % EX SOLN
Freq: Two times a day (BID) | CUTANEOUS | Status: AC
  Administered 2022-08-31: 1
  Filled 2022-08-31: qty 473

## 2022-08-31 MED ORDER — BUSPIRONE HCL 5 MG PO TABS
15.0000 mg | ORAL_TABLET | Freq: Three times a day (TID) | ORAL | Status: DC
  Administered 2022-08-31 – 2022-09-03 (×10): 15 mg via ORAL
  Filled 2022-08-31 (×10): qty 3

## 2022-08-31 NOTE — Progress Notes (Signed)
Inpatient Rehabilitation Admissions Coordinator   I met at bedside with patient and Mom. Patient with significant pain postop. I await further progress with therapy before pursuing Auth for a possible Cir admit.  Ottie Glazier, RN, MSN Rehab Admissions Coordinator [phone] 08/31/2022 12:34 PM

## 2022-08-31 NOTE — Progress Notes (Signed)
IR following.   CT AP with ordered on 5/6 for further eval but patient has been refusing due to pain after sx yesterday.   Will review CT with IR radiologist when it is done.   Lynann Bologna Gyanna Jarema PA-C 08/31/2022 1:09 PM

## 2022-08-31 NOTE — Consult Note (Signed)
WOC team attempted to make ostomy teaching visit. Patient is in pain and says he can not participate today. Mother in room, states she would like to learn how to care for ostomy but patient adamantly refuses to be touched at this time.    WOC team will continue to follow patient for ostomy education and support. Will check back in next day or 2.   Thank you,    Priscella Mann MSN, RN-BC, [employer] [phone]

## 2022-08-31 NOTE — Progress Notes (Signed)
Pt's ostomy output now appears to be bile - it is thin/green. Prior to this it did appear to be just stool. Dr. Bedelia Person was made aware. Pt to be going down for CT any time now. Dr. Bedelia Person aware that pt has only drank 1 bottle of contrast and gave orders to go ahead and send him. CT aware and said they are putting in for transport.   Robina Ade, RN
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
## 2022-08-31 NOTE — Progress Notes (Signed)
PT Cancellation Note  Patient Details Name: Shane Klein MRN: [id number] DOB: 07/10/1990   Cancelled Treatment:    Reason Eval/Treat Not Completed: Pain limiting ability to participate  Patient refused PT initially at 1:00 pm due to pain. Agreed PT could return later in afternoon to do some bed-level exercises. On return, he refused any movement/mobility due to his pain. Educated on benefits of mobility and may help decrease his pain, however he politely refused.    Jerolyn Center, PT Acute Rehabilitation Services  Office [phone]  Zena Amos 08/31/2022, 3:49 PM

## 2022-08-31 NOTE — Progress Notes (Signed)
Patient refused lab draws this morning. Per pt's, "He has the right to refuse." Pt educated on the consequences of refusing medical treatments.

## 2022-08-31 NOTE — Progress Notes (Signed)
Pt's JP drains both flushed at this time and both leaked. Trauma and IR PA's were both made aware. CT of abdomen already ordered, so no new orders at this time. Will encourage pt to drink CT contrast.   Robina Ade, RN

## 2022-08-31 NOTE — Progress Notes (Signed)
ST Elevation reported by CCMD on Lead 3=3.3,AVF=4.6 &MCL=2.2. MD notified. Attempts to perform a 12 Lead EKG was refused by pt. MD notified.
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
## 2022-09-01 ENCOUNTER — Inpatient Hospital Stay (HOSPITAL_COMMUNITY): Payer: Medicaid Other

## 2022-09-01 LAB — HEPARIN LEVEL (UNFRACTIONATED): Heparin Unfractionated: <0.1 IU/mL — ABNORMAL LOW (ref 0.30–0.70)

## 2022-09-01 LAB — SURGICAL PATHOLOGY

## 2022-09-01 MED ORDER — APIXABAN 5 MG PO TABS
5.0000 mg | ORAL_TABLET | Freq: Two times a day (BID) | ORAL | Status: DC
  Administered 2022-09-01 – 2022-09-03 (×5): 5 mg via ORAL
  Filled 2022-09-01 (×5): qty 1

## 2022-09-01 MED ORDER — TRAMADOL HCL 50 MG PO TABS
100.0000 mg | ORAL_TABLET | Freq: Four times a day (QID) | ORAL | Status: DC
  Administered 2022-09-01 – 2022-09-03 (×8): 100 mg via ORAL
  Filled 2022-09-01 (×8): qty 2

## 2022-09-01 MED ORDER — HYDROMORPHONE HCL 1 MG/ML IJ SOLN: 0.2500 mg | Freq: Two times a day (BID) | INTRAMUSCULAR | Status: DC | PRN
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
## 2022-09-01 NOTE — Progress Notes (Signed)
ANTICOAGULATION CONSULT NOTE - Follow Up Consult  Pharmacy Consult for heparin > Eliquis  Indication:  SMV Thrombus   Allergies  Allergen Reactions   Lactose Intolerance (Gi)     Pt advised RN he is lactose intolerant.     Patient Measurements: Height: 5\' 10"  (177.8 cm) Weight: 71.1 kg (156 lb 12 oz) IBW/kg (Calculated) : 73  Vital Signs: Temp: 98.6 F (37 C) (05/08 0741) Temp Source: Oral (05/08 0741) BP: 120/74 (05/08 0741) Pulse Rate: 98 (05/08 0741)  Labs: Recent Labs    08/30/22 0310 09/01/22 0301  HGB 9.4*  --   HCT 29.0*  --   PLT 555*  --   HEPARINUNFRC 0.70 <0.10*  CREATININE 0.51*  --     Estimated Creatinine Clearance: 134.5 mL/min (A) (by C-G formula based on SCr of 0.51 mg/dL (L)).  Assessment: Previously on heparin gttt for SMV thrombus, held in post-op period. Pharmacy consulted to resume.   Plan:  Eliquis 5mg  BID.  Monitor for S/SX of bleeding.   Estill Batten, PharmD, BCCCP  09/01/2022,9:37 AM
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
## 2022-09-01 NOTE — Progress Notes (Signed)
Interventional Radiology Brief Note:  CT reviewed by Dr. Fredia Sorrow who notes the left upper quadrant has resolved.  The pelvic drain is now smaller.   Will continue to monitor/trend output.   Loyce Dys, MS RD PA-C 4:16 PM
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
## 2022-09-02 NOTE — Progress Notes (Signed)
Inpatient Rehabilitation Admissions Coordinator   I have insurance approval and likely CIR bed to admit him to on Friday. I met with patient and his sister at bedside and they are aware and in agreement. I have informed him that he can make a list of 5 people that he would like to be able to be his designated visitors while at CIR due to his "confidential "status. He is in agreement and happy to be able to have additional visitors. I will follow up in the am to arrange admit.  Ottie Glazier, RN, MSN Rehab Admissions Coordinator [phone] 09/02/2022 5:27 PM
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
## 2022-09-02 NOTE — Plan of Care (Signed)
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
## 2022-09-03 ENCOUNTER — Encounter (HOSPITAL_COMMUNITY): Payer: Self-pay | Admitting: Physical Medicine and Rehabilitation

## 2022-09-03 ENCOUNTER — Encounter (INDEPENDENT_AMBULATORY_CARE_PROVIDER_SITE_OTHER): Payer: Self-pay | Admitting: Primary Care

## 2022-09-03 ENCOUNTER — Inpatient Hospital Stay (HOSPITAL_COMMUNITY)
Admission: RE | Admit: 2022-09-03 | Discharge: 2022-10-05 | DRG: 945 | Disposition: A | Discharge status: Home / Self Care | Payer: Medicaid Other | Source: Intra-hospital | Attending: Physical Medicine and Rehabilitation | Admitting: Physical Medicine and Rehabilitation

## 2022-09-03 ENCOUNTER — Other Ambulatory Visit: Payer: Self-pay

## 2022-09-03 DIAGNOSIS — R339 Retention of urine, unspecified: Secondary | ICD-10-CM | POA: Diagnosis present

## 2022-09-03 DIAGNOSIS — K651 Peritoneal abscess: Secondary | ICD-10-CM | POA: Diagnosis present

## 2022-09-03 DIAGNOSIS — Z681 Body mass index (BMI) 19 or less, adult: Secondary | ICD-10-CM | POA: Diagnosis not present

## 2022-09-03 DIAGNOSIS — R7401 Elevation of levels of liver transaminase levels: Secondary | ICD-10-CM | POA: Diagnosis not present

## 2022-09-03 DIAGNOSIS — S72102A Unspecified trochanteric fracture of left femur, initial encounter for closed fracture: Secondary | ICD-10-CM

## 2022-09-03 DIAGNOSIS — D75839 Thrombocytosis, unspecified: Secondary | ICD-10-CM | POA: Diagnosis not present

## 2022-09-03 DIAGNOSIS — E440 Moderate protein-calorie malnutrition: Secondary | ICD-10-CM | POA: Diagnosis present

## 2022-09-03 DIAGNOSIS — I959 Hypotension, unspecified: Secondary | ICD-10-CM | POA: Diagnosis not present

## 2022-09-03 DIAGNOSIS — R000 Tachycardia, unspecified: Secondary | ICD-10-CM | POA: Diagnosis present

## 2022-09-03 DIAGNOSIS — L89323 Pressure ulcer of left buttock, stage 3: Secondary | ICD-10-CM | POA: Diagnosis present

## 2022-09-03 DIAGNOSIS — R509 Fever, unspecified: Secondary | ICD-10-CM | POA: Diagnosis not present

## 2022-09-03 DIAGNOSIS — S36592D Other injury of descending [left] colon, subsequent encounter: Secondary | ICD-10-CM

## 2022-09-03 DIAGNOSIS — E861 Hypovolemia: Secondary | ICD-10-CM | POA: Diagnosis not present

## 2022-09-03 DIAGNOSIS — G8222 Paraplegia, incomplete: Secondary | ICD-10-CM | POA: Diagnosis present

## 2022-09-03 DIAGNOSIS — Z6822 Body mass index (BMI) 22.0-22.9, adult: Secondary | ICD-10-CM

## 2022-09-03 DIAGNOSIS — R7989 Other specified abnormal findings of blood chemistry: Secondary | ICD-10-CM | POA: Diagnosis not present

## 2022-09-03 DIAGNOSIS — T148XXA Other injury of unspecified body region, initial encounter: Secondary | ICD-10-CM | POA: Diagnosis not present

## 2022-09-03 DIAGNOSIS — L89626 Pressure-induced deep tissue damage of left heel: Secondary | ICD-10-CM | POA: Diagnosis not present

## 2022-09-03 DIAGNOSIS — F4320 Adjustment disorder, unspecified: Secondary | ICD-10-CM | POA: Diagnosis not present

## 2022-09-03 DIAGNOSIS — D62 Acute posthemorrhagic anemia: Secondary | ICD-10-CM | POA: Diagnosis present

## 2022-09-03 DIAGNOSIS — S21242D Puncture wound with foreign body of left back wall of thorax without penetration into thoracic cavity, subsequent encounter: Secondary | ICD-10-CM

## 2022-09-03 DIAGNOSIS — L89512 Pressure ulcer of right ankle, stage 2: Secondary | ICD-10-CM | POA: Diagnosis present

## 2022-09-03 DIAGNOSIS — E876 Hypokalemia: Secondary | ICD-10-CM | POA: Diagnosis not present

## 2022-09-03 DIAGNOSIS — K5090 Crohn's disease, unspecified, without complications: Secondary | ICD-10-CM | POA: Diagnosis present

## 2022-09-03 DIAGNOSIS — L89313 Pressure ulcer of right buttock, stage 3: Secondary | ICD-10-CM | POA: Diagnosis present

## 2022-09-03 DIAGNOSIS — Z79899 Other long term (current) drug therapy: Secondary | ICD-10-CM

## 2022-09-03 DIAGNOSIS — S72101A Unspecified trochanteric fracture of right femur, initial encounter for closed fracture: Secondary | ICD-10-CM

## 2022-09-03 DIAGNOSIS — W3400XD Accidental discharge from unspecified firearms or gun, subsequent encounter: Secondary | ICD-10-CM | POA: Diagnosis not present

## 2022-09-03 DIAGNOSIS — S24103A Unspecified injury at T7-T10 level of thoracic spinal cord, initial encounter: Secondary | ICD-10-CM | POA: Diagnosis not present

## 2022-09-03 DIAGNOSIS — E43 Unspecified severe protein-calorie malnutrition: Secondary | ICD-10-CM | POA: Diagnosis present

## 2022-09-03 DIAGNOSIS — F4322 Adjustment disorder with anxiety: Secondary | ICD-10-CM | POA: Diagnosis present

## 2022-09-03 DIAGNOSIS — S24109S Unspecified injury at unspecified level of thoracic spinal cord, sequela: Secondary | ICD-10-CM

## 2022-09-03 DIAGNOSIS — L89892 Pressure ulcer of other site, stage 2: Secondary | ICD-10-CM | POA: Diagnosis present

## 2022-09-03 DIAGNOSIS — R64 Cachexia: Secondary | ICD-10-CM | POA: Diagnosis present

## 2022-09-03 DIAGNOSIS — S24154D Other incomplete lesion at T11-T12 level of thoracic spinal cord, subsequent encounter: Secondary | ICD-10-CM | POA: Diagnosis present

## 2022-09-03 DIAGNOSIS — I82210 Acute embolism and thrombosis of superior vena cava: Secondary | ICD-10-CM | POA: Diagnosis present

## 2022-09-03 DIAGNOSIS — E871 Hypo-osmolality and hyponatremia: Secondary | ICD-10-CM | POA: Diagnosis not present

## 2022-09-03 DIAGNOSIS — Z8674 Personal history of sudden cardiac arrest: Secondary | ICD-10-CM | POA: Diagnosis not present

## 2022-09-03 DIAGNOSIS — Z932 Ileostomy status: Secondary | ICD-10-CM | POA: Diagnosis not present

## 2022-09-03 DIAGNOSIS — Z9049 Acquired absence of other specified parts of digestive tract: Secondary | ICD-10-CM

## 2022-09-03 DIAGNOSIS — S31649D Puncture wound with foreign body of abdominal wall, unspecified quadrant with penetration into peritoneal cavity, subsequent encounter: Secondary | ICD-10-CM

## 2022-09-03 DIAGNOSIS — S72121E Displaced fracture of lesser trochanter of right femur, subsequent encounter for open fracture type I or II with routine healing: Secondary | ICD-10-CM | POA: Diagnosis not present

## 2022-09-03 DIAGNOSIS — T8142XD Infection following a procedure, deep incisional surgical site, subsequent encounter: Secondary | ICD-10-CM | POA: Diagnosis not present

## 2022-09-03 DIAGNOSIS — F4329 Adjustment disorder with other symptoms: Secondary | ICD-10-CM | POA: Diagnosis not present

## 2022-09-03 DIAGNOSIS — E739 Lactose intolerance, unspecified: Secondary | ICD-10-CM | POA: Diagnosis present

## 2022-09-03 DIAGNOSIS — W3400XA Accidental discharge from unspecified firearms or gun, initial encounter: Secondary | ICD-10-CM

## 2022-09-03 DIAGNOSIS — F515 Nightmare disorder: Secondary | ICD-10-CM | POA: Diagnosis present

## 2022-09-03 DIAGNOSIS — R252 Cramp and spasm: Secondary | ICD-10-CM | POA: Diagnosis not present

## 2022-09-03 DIAGNOSIS — S72112E Displaced fracture of greater trochanter of left femur, subsequent encounter for open fracture type I or II with routine healing: Secondary | ICD-10-CM | POA: Diagnosis not present

## 2022-09-03 DIAGNOSIS — G5631 Lesion of radial nerve, right upper limb: Secondary | ICD-10-CM | POA: Diagnosis present

## 2022-09-03 DIAGNOSIS — R109 Unspecified abdominal pain: Secondary | ICD-10-CM | POA: Diagnosis not present

## 2022-09-03 DIAGNOSIS — R270 Ataxia, unspecified: Secondary | ICD-10-CM | POA: Diagnosis present

## 2022-09-03 DIAGNOSIS — F4310 Post-traumatic stress disorder, unspecified: Secondary | ICD-10-CM | POA: Diagnosis present

## 2022-09-03 DIAGNOSIS — S42411D Displaced simple supracondylar fracture without intercondylar fracture of right humerus, subsequent encounter for fracture with routine healing: Secondary | ICD-10-CM

## 2022-09-03 DIAGNOSIS — Z5329 Procedure and treatment not carried out because of patient's decision for other reasons: Secondary | ICD-10-CM | POA: Diagnosis not present

## 2022-09-03 LAB — GLUCOSE, CAPILLARY
Glucose-Capillary: 87 mg/dL (ref 70–99)
Glucose-Capillary: 86 mg/dL (ref 70–99)

## 2022-09-03 MED ORDER — MIRTAZAPINE 15 MG PO TABS
15.0000 mg | ORAL_TABLET | Freq: Every day | ORAL | Status: DC
  Administered 2022-09-03 – 2022-09-06 (×4): 15 mg via ORAL
  Filled 2022-09-03 (×4): qty 1

## 2022-09-03 MED ORDER — ACETAMINOPHEN 325 MG PO TABS: 325.0000 mg | ORAL_TABLET | ORAL | Status: DC | PRN

## 2022-09-03 MED ORDER — METHOCARBAMOL 500 MG PO TABS
1000.0000 mg | ORAL_TABLET | Freq: Four times a day (QID) | ORAL | Status: DC
  Administered 2022-09-03 – 2022-10-05 (×123): 1000 mg via ORAL
  Filled 2022-09-03 (×128): qty 2

## 2022-09-03 MED ORDER — PANTOPRAZOLE SODIUM 40 MG PO TBEC
40.0000 mg | DELAYED_RELEASE_TABLET | Freq: Two times a day (BID) | ORAL | Status: DC
  Administered 2022-09-03 – 2022-09-30 (×54): 40 mg via ORAL
  Filled 2022-09-03 (×54): qty 1

## 2022-09-03 MED ORDER — FERROUS SULFATE 325 (65 FE) MG PO TABS
325.0000 mg | ORAL_TABLET | Freq: Two times a day (BID) | ORAL | Status: DC
Start: 2022-09-03 — End: 2022-10-05
  Administered 2022-09-03 – 2022-10-05 (×64): 325 mg via ORAL
  Filled 2022-09-03 (×65): qty 1

## 2022-09-03 MED ORDER — BUSPIRONE HCL 15 MG PO TABS
15.0000 mg | ORAL_TABLET | Freq: Three times a day (TID) | ORAL | Status: DC
  Administered 2022-09-03 – 2022-10-05 (×96): 15 mg via ORAL
  Filled 2022-09-03 (×96): qty 1

## 2022-09-03 MED ORDER — LOPERAMIDE HCL 2 MG PO CAPS
4.0000 mg | ORAL_CAPSULE | Freq: Four times a day (QID) | ORAL | Status: DC
  Administered 2022-09-03 – 2022-10-05 (×127): 4 mg via ORAL
  Filled 2022-09-03 (×127): qty 2

## 2022-09-03 MED ORDER — PIPERACILLIN-TAZOBACTAM 3.375 G IVPB
3.3750 g | Freq: Three times a day (TID) | INTRAVENOUS | Status: AC
  Administered 2022-09-03 – 2022-09-05 (×6): 3.375 g via INTRAVENOUS
  Filled 2022-09-03 (×5): qty 50

## 2022-09-03 MED ORDER — APIXABAN 5 MG PO TABS
5.0000 mg | ORAL_TABLET | Freq: Two times a day (BID) | ORAL | Status: DC
  Administered 2022-09-03 – 2022-10-05 (×64): 5 mg via ORAL
  Filled 2022-09-03 (×64): qty 1

## 2022-09-03 MED ORDER — CALCIUM POLYCARBOPHIL 625 MG PO TABS
1250.0000 mg | ORAL_TABLET | Freq: Two times a day (BID) | ORAL | Status: DC
  Administered 2022-09-03 – 2022-09-13 (×21): 1250 mg via ORAL
  Filled 2022-09-03 (×21): qty 2

## 2022-09-03 MED ORDER — OXYCODONE HCL ER 15 MG PO T12A
60.0000 mg | EXTENDED_RELEASE_TABLET | Freq: Two times a day (BID) | ORAL | Status: DC
  Administered 2022-09-03 – 2022-10-05 (×64): 60 mg via ORAL
  Filled 2022-09-03 (×63): qty 4

## 2022-09-03 MED ORDER — TRAMADOL HCL 50 MG PO TABS
100.0000 mg | ORAL_TABLET | Freq: Four times a day (QID) | ORAL | Status: DC
  Administered 2022-09-03 – 2022-10-05 (×125): 100 mg via ORAL
  Filled 2022-09-03 (×127): qty 2

## 2022-09-03 MED ORDER — MAGNESIUM OXIDE -MG SUPPLEMENT 400 (240 MG) MG PO TABS
400.0000 mg | ORAL_TABLET | Freq: Two times a day (BID) | ORAL | Status: DC
Start: 2022-09-03 — End: 2022-09-23
  Administered 2022-09-03 – 2022-09-23 (×40): 400 mg via ORAL
  Filled 2022-09-03 (×40): qty 1

## 2022-09-03 MED ORDER — GABAPENTIN 300 MG PO CAPS
600.0000 mg | ORAL_CAPSULE | Freq: Three times a day (TID) | ORAL | Status: DC
  Administered 2022-09-03 – 2022-09-06 (×11): 600 mg via ORAL
  Filled 2022-09-03 (×12): qty 2

## 2022-09-03 MED ORDER — METOPROLOL TARTRATE 25 MG PO TABS
25.0000 mg | ORAL_TABLET | Freq: Two times a day (BID) | ORAL | Status: DC
  Administered 2022-09-03 – 2022-09-09 (×12): 25 mg via ORAL
  Filled 2022-09-03 (×11): qty 1

## 2022-09-03 MED ORDER — CALCIUM CARBONATE ANTACID 500 MG PO CHEW: 1.0000 | CHEWABLE_TABLET | Freq: Three times a day (TID) | ORAL | Status: DC | PRN

## 2022-09-03 MED ORDER — ADULT MULTIVITAMIN W/MINERALS CH
1.0000 | ORAL_TABLET | Freq: Every day | ORAL | Status: DC
  Administered 2022-09-04 – 2022-10-05 (×32): 1 via ORAL
  Filled 2022-09-03 (×32): qty 1

## 2022-09-03 MED ORDER — VITAMIN C 500 MG PO TABS
1000.0000 mg | ORAL_TABLET | Freq: Every day | ORAL | Status: DC
  Administered 2022-09-04 – 2022-10-05 (×32): 1000 mg via ORAL
  Filled 2022-09-03 (×32): qty 2

## 2022-09-03 MED ORDER — PROMETHAZINE HCL 25 MG RE SUPP: 25.0000 mg | Freq: Four times a day (QID) | RECTAL | Status: DC | PRN

## 2022-09-03 MED ORDER — VITAMIN B-12 1000 MCG PO TABS
1000.0000 ug | ORAL_TABLET | Freq: Every day | ORAL | Status: DC
  Administered 2022-09-04 – 2022-10-05 (×32): 1000 ug via ORAL
  Filled 2022-09-03 (×34): qty 1

## 2022-09-03 MED ORDER — OXYCODONE HCL 5 MG PO TABS
10.0000 mg | ORAL_TABLET | ORAL | Status: DC | PRN
  Administered 2022-09-04: 15 mg via ORAL
  Administered 2022-09-05: 20 mg via ORAL
  Administered 2022-09-06: 10 mg via ORAL
  Administered 2022-09-07 – 2022-09-11 (×2): 20 mg via ORAL
  Administered 2022-09-12: 15 mg via ORAL
  Administered 2022-09-15: 10 mg via ORAL
  Administered 2022-09-16: 20 mg via ORAL
  Administered 2022-09-17: 10 mg via ORAL
  Administered 2022-09-17 – 2022-09-18 (×2): 20 mg via ORAL
  Administered 2022-09-21: 10 mg via ORAL
  Administered 2022-09-22 – 2022-09-23 (×2): 20 mg via ORAL
  Administered 2022-09-27: 10 mg via ORAL
  Administered 2022-09-29: 20 mg via ORAL
  Administered 2022-09-30 – 2022-10-03 (×4): 10 mg via ORAL
  Filled 2022-09-03 (×2): qty 2
  Filled 2022-09-03 (×2): qty 4
  Filled 2022-09-03: qty 3
  Filled 2022-09-03: qty 2
  Filled 2022-09-03: qty 4
  Filled 2022-09-03: qty 2
  Filled 2022-09-03 (×3): qty 4
  Filled 2022-09-03 (×3): qty 2
  Filled 2022-09-03: qty 4
  Filled 2022-09-03: qty 3
  Filled 2022-09-03: qty 4
  Filled 2022-09-03: qty 2
  Filled 2022-09-03 (×2): qty 4
  Filled 2022-09-03: qty 2

## 2022-09-03 MED ORDER — DIPHENOXYLATE-ATROPINE 2.5-0.025 MG PO TABS
2.0000 | ORAL_TABLET | Freq: Four times a day (QID) | ORAL | Status: DC
  Administered 2022-09-03 – 2022-10-05 (×127): 2 via ORAL
  Filled 2022-09-03 (×123): qty 2

## 2022-09-03 MED ORDER — TRAZODONE HCL 50 MG PO TABS
100.0000 mg | ORAL_TABLET | Freq: Every evening | ORAL | Status: DC | PRN
  Administered 2022-09-05 – 2022-10-04 (×5): 100 mg via ORAL
  Filled 2022-09-03 (×5): qty 2

## 2022-09-03 MED ORDER — TAMSULOSIN HCL 0.4 MG PO CAPS
0.4000 mg | ORAL_CAPSULE | Freq: Every day | ORAL | Status: DC
  Administered 2022-09-04 – 2022-09-09 (×6): 0.4 mg via ORAL
  Filled 2022-09-03 (×6): qty 1

## 2022-09-03 MED ORDER — MELATONIN 3 MG PO TABS
3.0000 mg | ORAL_TABLET | Freq: Every day | ORAL | Status: DC
  Administered 2022-09-04 – 2022-10-04 (×32): 3 mg via ORAL
  Filled 2022-09-03 (×32): qty 1

## 2022-09-03 MED ORDER — HYDROXYZINE HCL 25 MG PO TABS
25.0000 mg | ORAL_TABLET | Freq: Three times a day (TID) | ORAL | Status: DC | PRN
  Administered 2022-09-04 – 2022-09-16 (×6): 25 mg via ORAL
  Filled 2022-09-03 (×6): qty 1

## 2022-09-03 MED ORDER — VITAMIN D 25 MCG (1000 UNIT) PO TABS
2000.0000 [IU] | ORAL_TABLET | Freq: Two times a day (BID) | ORAL | Status: DC
  Administered 2022-09-03 – 2022-10-05 (×63): 2000 [IU] via ORAL
  Filled 2022-09-03 (×64): qty 2

## 2022-09-03 MED ORDER — PROMETHAZINE HCL 25 MG PO TABS
25.0000 mg | ORAL_TABLET | Freq: Four times a day (QID) | ORAL | Status: DC | PRN
  Administered 2022-09-05 – 2022-09-08 (×2): 25 mg via ORAL
  Filled 2022-09-03 (×3): qty 1
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
## 2022-09-03 NOTE — Plan of Care (Signed)
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
## 2022-09-03 NOTE — Progress Notes (Signed)
Inpatient Rehabilitation Admission Medication Review by a Pharmacist  A complete drug regimen review was completed for this patient to identify any potential clinically significant medication issues.  High Risk Drug Classes Is patient taking? Indication by Medication  Antipsychotic No   Anticoagulant Yes Apixaban - SVC thrombus  Antibiotic Yes Zosyn>5/12 - psoas abscess  Opioid Yes Oxycodone/tramadol - pain  Antiplatelet No   Hypoglycemics/insulin No   Vasoactive Medication Yes Metoprolol - tachycardia  Chemotherapy No   Other Yes Vitamin C/vitamin D/B12/mag Ox/MVI - supplementation Melatonin - sleep Lomotil/imodium - diarrhea Ferrous sulfate - anemia Remeron - depression/sleep Buspar/hydroxysine - anxiety Gabapentin - neuropathy Robaxin - spasms Protonix - GERD Phenergan - N/V Flomax - BPH Trazodone - sleep     Type of Medication Issue Identified Description of Issue Recommendation(s)  Drug Interaction(s) (clinically significant)     Duplicate Therapy     Allergy     No Medication Administration End Date     Incorrect Dose     Additional Drug Therapy Needed     Significant med changes from prior encounter (inform family/care partners about these prior to discharge).    Other        Clinically significant medication issues were identified that warrant physician communication and completion of prescribed/recommended actions by midnight of the next day:  No  Name of provider notified for urgent issues identified:   Provider Method of Notification:     Pharmacist comments:   Time spent performing this drug regimen review (minutes):  20   Ulyses Southward, PharmD, [place], AAHIVP, CPP Infectious Disease Pharmacist 09/03/2022 2:04 PM

## 2022-09-03 NOTE — Discharge Instructions (Addendum)
Inpatient Rehab Discharge Instructions  [facility] Discharge date and time: No discharge date for patient encounter.   Activities/Precautions/ Functional Status: Activity: activity as tolerated Diet: regular diet Wound Care: Routine skin checks Functional status:  ___ No restrictions     ___ Walk up steps independently ___ 24/7 supervision/assistance   ___ Walk up steps with assistance ___ Intermittent supervision/assistance  ___ Bathe/dress independently ___ Walk with walker     _x__ Bathe/dress with assistance ___ Walk Independently    ___ Shower independently ___ Walk with assistance    ___ Shower with assistance ___ No alcohol     ___ Return to work/school ________  Special Instructions: NO driving smoking or alcohol    COMMUNITY REFERRALS UPON DISCHARGE:    Outpatient: PT  & OT             Agency:[facility] [address] [place] [address] Phone:[phone]              Appointment Date/Time:WILL CALL TO SET UP FOLLOW UP APPOINTMENTS  Medical Equipment/Items Ordered:[facility], [facility]  [phone]                                                  [name] [phone] PCS REFERRAL MADE GO THROUGH [employer] MEDICAID WILL REACH OUT TO PATIENT AND MOM TRANSPORTATION FOR APPOINTMENTS [phone] OR [phone] TO SCHEDULE RIDES  My questions have been answered and I understand these instructions. I will adhere to these goals and the provided educational materials after my discharge from the [facility].  Patient/Caregiver Signature _______________________________ Date __________  Clinician Signature _______________________________________ Date __________  Please bring this form and your medication list with you to all your follow-up doctor's appointments.   Information on my medicine - ELIQUIS (apixaban)  This medication education was reviewed with me or my healthcare representative  as part of my discharge preparation.  The pharmacist that spoke with me during my [facility] stay was:  Ulyses Southward, RPH-CPP  Why was Eliquis prescribed for you? Eliquis was prescribed to treat blood clots that may have been found in the veins of your legs (deep vein thrombosis) or in your lungs (pulmonary embolism) and to reduce the risk of them occurring again.  What do You need to know about Eliquis ? Continue Eliquis 5 mg tablet taken TWICE daily.  Eliquis may be taken with or without food.   Try to take the dose about the same time in the morning and in the evening. If you have difficulty swallowing the tablet whole please discuss with your pharmacist how to take the medication safely.  Take Eliquis exactly as prescribed and DO NOT stop taking Eliquis without talking to the doctor who prescribed the medication.  Stopping may increase your risk of developing a new blood clot.  Refill your prescription before you run out.  After discharge, you should have regular check-up appointments with your healthcare provider that is prescribing your Eliquis.    What do you do if you miss a dose? If a dose of ELIQUIS is not taken at the scheduled time, take it as soon as possible on the same day and twice-daily administration should be resumed. The dose should not be doubled to make up for a missed dose.  Important Safety Information A possible side effect of Eliquis  is bleeding. You should call your healthcare provider right away if you experience any of the following: Bleeding from an injury or your nose that does not stop. Unusual colored urine (red or dark brown) or unusual colored stools (red or black). Unusual bruising for unknown reasons. A serious fall or if you hit your head (even if there is no bleeding).  Some medicines may interact with Eliquis and might increase your risk of bleeding or clotting while on Eliquis. To help avoid this, consult your healthcare provider or pharmacist  prior to using any new prescription or non-prescription medications, including herbals, vitamins, non-steroidal anti-inflammatory drugs (NSAIDs) and supplements.  This website has more information on Eliquis (apixaban): http://[email or web address].com/eliquis/home
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
## 2022-09-03 NOTE — Progress Notes (Signed)
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 administration, pain management, and patient education, does the patient require 24 hr/day rehab nursing? Yes Does the patient require coordinated care of a physician, rehab nurse, therapy disciplines of PT, OT, potentially SLP  to address physical and functional deficits in the context of the above medical diagnosis(es)? Yes Addressing deficits in the following areas: balance, endurance, locomotion, strength, transferring, bowel/bladder control, bathing, dressing, feeding, grooming, toileting, and swallowing Can the patient actively participate in an intensive therapy program of at least 3 hrs of therapy per day at least 5 days per week? Yes and Potentially The potential for patient to make measurable gains while on inpatient rehab is excellent Anticipated functional outcomes upon discharge from inpatient rehab are min assist  with PT, min assist and mod assist with OT, modified independent with SLP. Estimated rehab length of stay to reach the above functional goals is: 20-28 days Anticipated discharge destination: Home Overall Rehab/Functional Prognosis: excellent   POST ACUTE  RECOMMENDATIONS: This patient's condition is appropriate for continued rehabilitative care in the following setting: CIR Patient has agreed to participate in recommended program. Yes Note that insurance prior authorization may be required for reimbursement for recommended care.   Comment: I spoke to patient and mom at length. We discussed that he will need physical assist once home given his multiple injuries and likely spinal cord injury. Mom says that physical assistance is available once he's home     MEDICAL RECOMMENDATIONS: Consider dedicated CT of thoraco-lumbar spine to assess extent of upper left cord injury and to help establish prognosis.  Screening bilateral LE venous dopplers Lovenox 30mg  q12 for DVT prophylaxis when clear from a surgical standpoint.     I have personally performed a face to face diagnostic evaluation of this patient. Additionally, I have examined the patient's medical record including any pertinent labs and radiographic images. If the physician assistant has documented in this note, I have reviewed and edited or otherwise concur with the physician assistant's documentation.   Thanks,   Ranelle Oyster, MD 08/03/2022

## 2022-09-03 NOTE — Progress Notes (Signed)
Inpatient Rehabilitation Admissions Coordinator   I have insurance approval and CIR bed to admit him to today. Acute team and TOC made aware. I will make the arrangements.  Ottie Glazier, RN, MSN Rehab Admissions Coordinator [phone] 09/03/2022 10:42 AM

## 2022-09-03 NOTE — Consult Note (Signed)
WOC Nurse ostomy follow up; WOC nurse notified by bedside nurse that ileostomy was leaking and had leaked stool into wound; patient was wearing a fecal containment device when I checked him on 5/9 that was intact; per bedside nurse pouch was changed 3 times overnight  Stoma type/location: RUQ ileostomy  Stomal assessment/size: 1" round, slightly above skin level, pink and moist  Peristomal assessment: skin is denuded from 6 o'clock to 9 o'clock, midline wound to left of ostomy with retention sutures in place; dressing noted to be saturated in green stool  Treatment options for stomal/peristomal skin: crusted skin around ostomy with stoma powder and no sting barrier wipes, 2" barrier ring  Output green liquid stool  Ostomy pouching: Placed patient in a 2 1/4" skin barrier [place] [phone]) and 2 1/4" high output pouch [place] [phone]) that was hooked to a bedside drainage bag  Education provided: Patient wants mother and other family members to care for ostomy.  WOC has had one education session with patient where he did not do any actual hands on work with ostomy.  Mother has stated she would like education however on the last visit when mother was in the room patient was in too much pain to be touched.  Patient is moving to inpatient rehab today and WOC will continue to attempt education for patient and family.  Patient has had a long and complicated course since ileostomy placed 3/28.    Bedside staff should not place fecal containment devices to ostomy because these do not provide adequate skin coverage and there is a greater risk for further skin breakdown.    STEPS TO CARE OF ILEOSTOMY:   Remove old ostomy pouch, clean around stoma with water moistened washcloth only.  Crust any denuded skin with ostomy powder and no sting barrier wipes as follows:  Sprinkle ostomy powder over any irritated skin, brush away excess powder Tap lightly over the powder with Cavilon No-Sting barrier wipe (skin barrier  wipe-white and blue package) or gloved finger damp with water Allow to dry Apply another layer of powder following the same steps up to three layers.   After skin is crusted, apply 2" barrier ring around stoma and secure 2 1/4" skin barrier and high output pouch. Hook pouch to bedside drainage bag.   Supplies in room at time of this visit:  box of multiple stoma powders, 2" barrier rings, (3) 2 1/4" skin barriers and (4) 2 1/4" high output pouches.    Enrolled patient in [place] [employer] Start Discharge program: Yes previously.   WOC will continue to follow patient for ostomy support and education.    Thank you,    Priscella Mann MSN, RN-BC, [employer] [phone]

## 2022-09-04 DIAGNOSIS — S24103A Unspecified injury at T7-T10 level of thoracic spinal cord, initial encounter: Secondary | ICD-10-CM

## 2022-09-04 LAB — GLUCOSE, CAPILLARY: Glucose-Capillary: 72 mg/dL (ref 70–99)
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
## 2022-09-04 NOTE — Progress Notes (Signed)
Physical Therapy Session Note  Patient Details  Name: Shane Klein MRN: [id number] Date of Birth: 1990-07-16  Today's Date: 09/04/2022 PT Individual Time: 4098-1191 PT Individual Time Calculation (min): 67 min   Short Term Goals: Week 1:  PT Short Term Goal 1 (Week 1): Pt will perform rolling R/L with min assist PT Short Term Goal 2 (Week 1): Pt will perform supine<>sit with max assist of 1 PT Short Term Goal 3 (Week 1): Pt will maintain static sitting balance EOM for at least 2 minutes with no more than supervision PT Short Term Goal 4 (Week 1): Pt will progress to standing tolerance in tilt table or harness support for at least 5 minutes PT Short Term Goal 5 (Week 1): Pt will transfer bed<>chair with +2 mod assist using LRAD  Skilled Therapeutic Interventions/Progress Updates:  Pt received supine in bed awake with PRAFOs removed (pt reports tolerating them for ~1.5hrs) and pt agreeable to therapy with encouragement, despite reports of fatigue. Donned gripper socks (pt doesn't tolerate consistent touch to B LEs). Supine>sitting R EOB, HOB slightly elevated and using bedrails, via +2 total assist for B LE management and trunk upright - pt doesn't like for his feet to come together during the transition and when bringing trunk upright has a strong posterior trunk lean/push due to the pain onset with this transition. Gradually, sitting EOB can progress to CGA for static sitting using L UE support. L lateral scoot EOB>TIS w/c using transfer board with total assist of 1 for lifting/scooting hips while maintaining balance with +2 for board placement and stabilization and assist with scooting hips - pt leaning forward and placing R arm around therapist to promote increased anterior trunk lean and maintaining WBing precautions - cuing throughout to increase anterior trunk lean and pt only able to tolerate bringing feet back underneath his BOS so far due to bilateral heel cord tightness. Vitals sitting  in TIS w/c: BP 116/86 (MAP 95), HR 96bpm , no symptoms   Transported to/from gym in w/c for time management and energy conservation.  Therapist adjusted length of pt's leg rests for improved tolerance and positioning. Pt tolerated sitting in TIS w/c ~20minutes, but pt reporting increasing low back pain while upright in w/c - pt does have some muscle tightness causing excessive posterior pelvic tilt likely contributing to pulling/pain in low back.   R lateral scoot transfer TIS w/c>EOB with +2 total assist - again therapist providing facilitation for anterior trunk lean using R UE support around therapist's shoulders, therapist manually placing B LEs underneath BOS to pt's tolerance (limited ability to bring LEs back due to heel cord tightness with associated pain), with +2 providing dependent board placement and assist with lifting/scooting hips. Pt reporting increased pain with this transfer back to bed due to pt not tolerating transfer board being placed underneath his R thigh.   Sit>supine with +2 total assist for trunk descent and B LE management into the bed. Pt reports overall increased low back pain and pain in B LEs from transfers and upright positioning in the wheelchair. Therapist educated pt on importance of progressing with this functional type of transfer rather than becoming reliant on a dependent lift machine.   Therapist assisted with positioning pillows under B LEs to float heels and R UE for support. Pt left supine in bed with needs in reach, lines intact, and rails up due to air mattress.   Of note: pt's air mattress felt to be warm to the touch upon therapist  arrival - notified nurse - planning to exchange bed for Kreg air mattress that will also allow tilt feature  Therapy Documentation Precautions:  Precautions Precautions: Fall, Other (comment) Precaution Comments: L JP drain, R colostomy/ileostomy, abdominal wound, R foot/ankle wound Required Braces or Orthoses: Other  Brace Other Brace: B LE PRAFOs Restrictions Weight Bearing Restrictions: Yes RUE Weight Bearing: Weight bear through elbow only RLE Weight Bearing: Weight bearing as tolerated LLE Weight Bearing: Weight bearing as tolerated Other Position/Activity Restrictions: R UE no lifting > 5 lbs; no ROM restrictions in R UE   Pain: Reports pain of 7/10 in B feet (L>R) and low back - increases with mobility - pt premedicated, but nurse still notified - provided emotional support, distraction, and repositioning for pain management.    Therapy/Group: Individual Therapy  Ginny Forth , PT, DPT, NCS, CSRS 09/04/2022, 2:15 PM
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
## 2022-09-04 NOTE — Progress Notes (Signed)
Pt refused CBG monitoring, Provider on call notified.
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
## 2022-09-04 NOTE — Progress Notes (Signed)
This nurse notified by PT that the bed was heating up below patients legs this nurse called portable equipment to see about a replacement sizewise air mattress for this patient and notified none were available ADON on call notified and permission received to call for a [employer] rental bed. Newbed in route to patient at this time patient in chair with therapy

## 2022-09-04 NOTE — Plan of Care (Signed)
  Problem: RH Balance Goal: LTG Patient will maintain dynamic sitting balance (PT) Description: LTG:  Patient will maintain dynamic sitting balance with assistance during mobility activities (PT) Flowsheets (Taken 09/04/2022 1246) LTG: Pt will maintain dynamic sitting balance during mobility activities with:: Contact Guard/Touching assist Goal: LTG Patient will maintain dynamic standing balance (PT) Description: LTG:  Patient will maintain dynamic standing balance with assistance during mobility activities (PT) Flowsheets (Taken 09/04/2022 1246) LTG: Pt will maintain dynamic standing balance during mobility activities with:: Moderate Assistance - Patient 50 - 74%   Problem: Sit to Stand Goal: LTG:  Patient will perform sit to stand with assistance level (PT) Description: LTG:  Patient will perform sit to stand with assistance level (PT) Flowsheets (Taken 09/04/2022 1246) LTG: PT will perform sit to stand in preparation for functional mobility with assistance level: Moderate Assistance - Patient 50 - 74%   Problem: RH Bed Mobility Goal: LTG Patient will perform bed mobility with assist (PT) Description: LTG: Patient will perform bed mobility with assistance, with/without cues (PT). Flowsheets (Taken 09/04/2022 1246) LTG: Pt will perform bed mobility with assistance level of: Contact Guard/Touching assist   Problem: RH Bed to Chair Transfers Goal: LTG Patient will perform bed/chair transfers w/assist (PT) Description: LTG: Patient will perform bed to chair transfers with assistance (PT). Flowsheets (Taken 09/04/2022 1246) LTG: Pt will perform Bed to Chair Transfers with assistance level: Minimal Assistance - Patient > 75%   Problem: RH Car Transfers Goal: LTG Patient will perform car transfers with assist (PT) Description: LTG: Patient will perform car transfers with assistance (PT). Flowsheets (Taken 09/04/2022 1246) LTG: Pt will perform car transfers with assist:: Moderate Assistance -  Patient 50 - 74%   Problem: RH Ambulation Goal: LTG Patient will ambulate in controlled environment (PT) Description: LTG: Patient will ambulate in a controlled environment, # of feet with assistance (PT). Flowsheets (Taken 09/04/2022 1246) LTG: Pt will ambulate in controlled environ  assist needed:: Moderate Assistance - Patient 50 - 74% LTG: Ambulation distance in controlled environment: 22ft using LRAD   Problem: RH Wheelchair Mobility Goal: LTG Patient will propel w/c in controlled environment (PT) Description: LTG: Patient will propel wheelchair in controlled environment, # of feet with assist (PT) Flowsheets (Taken 09/04/2022 1246) LTG: Pt will propel w/c in controlled environ  assist needed:: Supervision/Verbal cueing LTG: Propel w/c distance in controlled environment: 135ft Goal: LTG Patient will propel w/c in home environment (PT) Description: LTG: Patient will propel wheelchair in home environment, # of feet with assistance (PT). Flowsheets (Taken 09/04/2022 1246) LTG: Pt will propel w/c in home environ  assist needed:: Supervision/Verbal cueing LTG: Propel w/c distance in home environment: 30ft

## 2022-09-05 NOTE — Progress Notes (Signed)
Patient's bed replaced with [employer] air bed at 9pm. No issues overnight with the bed.
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
## 2022-09-06 LAB — CBC WITH DIFFERENTIAL/PLATELET
Abs Immature Granulocytes: 0.03 10*3/uL (ref 0.00–0.07)
Basophils Absolute: 0 10*3/uL (ref 0.0–0.1)
Basophils Relative: 0 %
Eosinophils Absolute: 0.3 10*3/uL (ref 0.0–0.5)
Eosinophils Relative: 4 %
HCT: 32.5 % — ABNORMAL LOW (ref 39.0–52.0)
Hemoglobin: 10.5 g/dL — ABNORMAL LOW (ref 13.0–17.0)
Immature Granulocytes: 0 %
Lymphocytes Relative: 15 %
Lymphs Abs: 1.2 10*3/uL (ref 0.7–4.0)
MCH: 26.1 pg (ref 26.0–34.0)
MCHC: 32.3 g/dL (ref 30.0–36.0)
MCV: 80.8 fL (ref 80.0–100.0)
Monocytes Absolute: 0.8 10*3/uL (ref 0.1–1.0)
Monocytes Relative: 9 %
Neutro Abs: 5.7 10*3/uL (ref 1.7–7.7)
Neutrophils Relative %: 72 %
Platelets: 916 10*3/uL (ref 150–400)
RBC: 4.02 MIL/uL — ABNORMAL LOW (ref 4.22–5.81)
RDW: 16.2 % — ABNORMAL HIGH (ref 11.5–15.5)
WBC: 8 10*3/uL (ref 4.0–10.5)
nRBC: 0 % (ref 0.0–0.2)

## 2022-09-06 LAB — COMPREHENSIVE METABOLIC PANEL
ALT: 11 U/L (ref 0–44)
AST: 10 U/L — ABNORMAL LOW (ref 15–41)
Albumin: 2.3 g/dL — ABNORMAL LOW (ref 3.5–5.0)
Alkaline Phosphatase: 128 U/L — ABNORMAL HIGH (ref 38–126)
Anion gap: 13 (ref 5–15)
BUN: 9 mg/dL (ref 6–20)
CO2: 27 mmol/L (ref 22–32)
Calcium: 9.2 mg/dL (ref 8.9–10.3)
Chloride: 87 mmol/L — ABNORMAL LOW (ref 98–111)
Creatinine, Ser: 0.58 mg/dL — ABNORMAL LOW (ref 0.61–1.24)
GFR, Estimated: >60 mL/min (ref >60)
Glucose, Bld: 82 mg/dL (ref 70–99)
Potassium: 3.2 mmol/L — ABNORMAL LOW (ref 3.5–5.1)
Sodium: 127 mmol/L — ABNORMAL LOW (ref 135–145)
Total Bilirubin: 0.4 mg/dL (ref 0.3–1.2)
Total Protein: 7.8 g/dL (ref 6.5–8.1)

## 2022-09-06 MED ORDER — POTASSIUM CHLORIDE CRYS ER 20 MEQ PO TBCR
40.0000 meq | EXTENDED_RELEASE_TABLET | Freq: Once | ORAL | Status: AC
  Administered 2022-09-06: 40 meq via ORAL
  Filled 2022-09-06: qty 2

## 2022-09-06 NOTE — Progress Notes (Signed)
Patient ID: Shane Klein, male   DOB: 07/03/1990, 32 y.o.   MRN: [id number]  Referral sent to [facility] to assist pt with disability application per his and Mom's request. They will communicate via email or phone will not be coming up here in person.
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
## 2022-09-06 NOTE — Progress Notes (Signed)
[facility] Individual Statement of Services  Patient Name:  Shane Klein  Date:  09/06/2022  Welcome to the [facility].  Our goal is to provide you with an individualized program based on your diagnosis and situation, designed to meet your specific needs.  With this comprehensive rehabilitation program, you will be expected to participate in at least 3 hours of rehabilitation therapies Monday-Friday, with modified therapy programming on the weekends.  Your rehabilitation program will include the following services:  Physical Therapy (PT), Occupational Therapy (OT), 24 hour per day rehabilitation nursing, Therapeutic Recreaction (TR), Neuropsychology, Care Coordinator, Rehabilitation Medicine, Nutrition Services, and Pharmacy Services  Weekly team conferences will be held on Tuesday to discuss your progress.  Your Inpatient Rehabilitation Care Coordinator will talk with you frequently to get your input and to update you on team discussions.  Team conferences with you and your family in attendance may also be held.  Expected length of stay: 4 weeks  Overall anticipated outcome: min-mod level of assist  Depending on your progress and recovery, your program may change. Your Inpatient Rehabilitation Care Coordinator will coordinate services and will keep you informed of any changes. Your Inpatient Rehabilitation Care Coordinator's name and contact numbers are listed  below.  The following services may also be recommended but are not provided by the In[facility]:  Driving Evaluations Home Health Rehabiltiation Services Outpatient Rehabilitation Services Vocational Rehabilitation   Arrangements will be made to provide these services after discharge if needed.  Arrangements include referral to agencies that provide these services.  Your insurance has been verified to be:  [employer] Your primary doctor is:  [facility]  Pertinent information will be shared with your doctor and your insurance company.  Inpatient Rehabilitation Care Coordinator:  Dossie Der, [place] [phone] or Luna Glasgow  Information discussed with and copy given to patient by: Lucy Chris, 09/06/2022, 1:24 PM
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
## 2022-09-06 NOTE — Consult Note (Signed)
WOC Nurse ostomy follow up Stoma type/location: RUQ, ileostomy   No issues with leaking noted by bedside nursing. I will reach out to plan visit with patient and his family when appropriate for teaching.  Enrolled patient in [place] Secure Start Discharge program: Yes  WOC Nurse will follow along with you for continued support with ostomy teaching and care Azlin [facility] MSN, RN, [place], CNS, [place] [phone]

## 2022-09-06 NOTE — Progress Notes (Signed)
Inpatient Rehabilitation  Patient information reviewed and entered into eRehab system by Pesach Frisch Leary Mcnulty, OTR/L, Rehab Quality Coordinator.   Information including medical coding, functional ability and quality indicators will be reviewed and updated through discharge.   

## 2022-09-06 NOTE — Progress Notes (Signed)
Occupational Therapy Session Note  Patient Details  Name: Shane Klein MRN: [id number] Date of Birth: 1990-12-03  Today's Date: 09/06/2022 OT Individual Time: 4098-1191 OT Individual Time Calculation (min): 73 min    Short Term Goals: Week 1:  OT Short Term Goal 1 (Week 1): Pt will maintain sitting balance EOB/EOM with MOD A of 1 in prep for BADLs OT Short Term Goal 2 (Week 1): Pt will groom with MIN A to demo ipmroved bimanual coordination OT Short Term Goal 3 (Week 1): Pt will complete 1/4 steps of UB dressing  Skilled Therapeutic Interventions/Progress Updates:  Pt received resting in bed with RN present for ostomy care. Session focused on bed-level ADLs. Pt agreeable to interventions, demonstrating overall pleasant mood. Pt with un-rated pain, premedicated. OT offering intermediate rest breaks and positioning suggestions throughout session to address pain/fatigue and maximize participation/safety in session.   Pt doffs/dons shirt with Min A due to constrictive paper scrub material. Pt completes UB sponge-bathing with setup A. Pt rolls R/L with Min A, doffing/donning over bottom/hips with Min A, requiring Max A for clothing management off/on lower legs. Pt performs bathing up anterior peri-area and upper thighs with setup, dependent for lower legs and buttock. Pt with wound on backside, foam bandage applied, and RN made aware.   Pt remained resting in bed with all immediate needs met at end of session. Pt continues to be appropriate for skilled OT intervention to promote further functional independence.   Therapy Documentation Precautions:  Precautions Precautions: Fall, Other (comment) Precaution Comments: L JP drain, R colostomy/ileostomy, abdominal wound, R foot/ankle wound Required Braces or Orthoses: Other Brace Other Brace: B LE PRAFOs Restrictions Weight Bearing Restrictions: Yes RUE Weight Bearing: Weight bear through elbow only RLE Weight Bearing: Weight bearing as  tolerated LLE Weight Bearing: Weight bearing as tolerated Other Position/Activity Restrictions: R UE no lifting > 5 lbs; no ROM restrictions in R UE   Therapy/Group: Individual Therapy  Lou Cal, OTR/L, MSOT  09/06/2022, 6:25 AM

## 2022-09-06 NOTE — Consult Note (Signed)
Working with rehab staff to obtain a time for ostomy teaching with patient and his family.   Can we look at a time when I can work with patient and his mother this week for ostomy teaching. It appears no real successful teaching has occurred. I can offer Tuesday 0930-130 (30 mins should be fine) or between 9-12pm Wednesday, or 10-2pm Thursday   Above message sent to staff, MD, CM.   Shane Klein [facility], CNS, [employer] [phone]

## 2022-09-06 NOTE — Progress Notes (Signed)
Date and time results received: 09/06/22 0957   Test: Platelets  Critical Value: 916  Name of Provider Notified: Deatra Ina, PA  Orders Received? Or Actions Taken?: No New orders at this time.   Marylu Lund, RN
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
## 2022-09-06 NOTE — Progress Notes (Signed)
Physical Therapy Session Note  Patient Details  Name: Shane Klein MRN: [id number] Date of Birth: [birth year]  Today's Date: 09/06/2022 PT Individual Time: 0915-1005, 4098-1191  PT Individual Time Calculation (min): 50 min   Short Term Goals: Week 1:  PT Short Term Goal 1 (Week 1): Pt will perform rolling R/L with min assist PT Short Term Goal 2 (Week 1): Pt will perform supine<>sit with max assist of 1 PT Short Term Goal 3 (Week 1): Pt will maintain static sitting balance EOM for at least 2 minutes with no more than supervision PT Short Term Goal 4 (Week 1): Pt will progress to standing tolerance in tilt table or harness support for at least 5 minutes PT Short Term Goal 5 (Week 1): Pt will transfer bed<>chair with +2 mod assist using LRAD  Skilled Therapeutic Interventions/Progress Updates:      Therapy Documentation Precautions:  Precautions Precautions: Fall, Other (comment) Precaution Comments: L JP drain, R colostomy/ileostomy, abdominal wound, R foot/ankle wound Required Braces or Orthoses: Other Brace Other Brace: B LE PRAFOs Restrictions Weight Bearing Restrictions: Yes RUE Weight Bearing: Weight bear through elbow only RLE Weight Bearing: Weight bearing as tolerated LLE Weight Bearing: Weight bearing as tolerated Other Position/Activity Restrictions: R UE no lifting > 5 lbs; no ROM restrictions in R UE  Treatment Session 1:   Pt received semi-reclined in bed with mother present and agreeable to PT session with emphasis on SCI education and bed mobility. PT educated patient and family regarding SCI and level of injury. PT also provided educated on multiple fractures. Pt receptive to education and reports understanding. Pt requires mod A x 1 with supine to sit with mod cues for sequencing. PT encouraged pt to hook R LE under L LE to mobilize, pt unable to perform, plan to provide pt with leg loop for mobility. Pt requires mod A for rolling to S/L and for transition to  sitting upright. Initially pt requires max A for static sitting balance and progressed to CGA with no UE support ~10-15 seconds. Pt requires max A x 1 with lateral scooting along edge of bed and total A for sit to lying. Pt left semi-reclined in bed with all needs in reach and mother present.     Treatment Session 2:  Pt received semi-reclined in bed with family present and agreeable to PT session with emphasis on bed mobility and transfer training. Pt with unrated back pain in session, provided rest and repositioning for relief.   PT provided pt with leg loop for L LE and pt requires mod A for LE management with supine to side lying and min A to transition to sitting edge of bed.   Pt total A for Beasy board placement and max A x 2 with lateral transfer to TIS w/c. Pt transported total A for time management to main gym.   Pt performed lateral transfer with Beasy board max A x 2 to mat table and requires close supervision for static sitting balance. PT positioned mirror in front of patient to provide visual feedback to help patient attain upright posture.   Pt requires CGA for dynamic sitting balance as pt performed R LE LAQ 1 x 10. Pt requires min A for dynamic sitting balance as he transitioned from R S/L to upright position to simulate parts of bed mobility.   PT provided pt with manual ultra light weight w/c with hybrid Roho cushion. Pt propelled w/c ~50 ft with increased time and requires mod cues for posture  as pt with increased shoulder flexion with propulsion.   Pt max A x 2 with uphill slide board transfer from w/c to Kreg bed and with sit to lying. Pt left semi-reclined in bed with all needs in reach.   Therapy/Group: Individual Therapy  Truitt Leep Truitt Leep PT, DPT  09/06/2022, 12:37 PM

## 2022-09-06 NOTE — Progress Notes (Signed)
Nutrition Follow-up  DOCUMENTATION CODES:   Non-severe (moderate) malnutrition in context of chronic illness  INTERVENTION:  - Add Ensure Enlive po TID, each supplement provides 350 kcal and 20 grams of protein.  - Add -1 packet Juven BID, each packet provides 95 calories, 2.5 grams of protein (collagen), and 9.8 grams of carbohydrate (3 grams sugar); also contains 7 grams of L-arginine and L-glutamine, 300 mg vitamin C, 15 mg vitamin E, 1.2 mcg vitamin B-12, 9.5 mg zinc, 200 mg calcium, and 1.5 g  Calcium Beta-hydroxy-Beta-methylbutyrate to support wound healing  NUTRITION DIAGNOSIS:   Moderate Malnutrition related to chronic illness as evidenced by moderate muscle depletion, moderate fat depletion.  GOAL:   Patient will meet greater than or equal to 90% of their needs  MONITOR:   PO intake, Supplement acceptance, Skin  REASON FOR ASSESSMENT:   Malnutrition Screening Tool    ASSESSMENT:   32 y.o. male admits to CIR related to functional deficits secondary to spinal cord injury due to fragments in the spinal canal at T12 and L2. PMH includes: acute radial nerve palsy, right supracondylar humerus fracture.  Meds reviewed:  Vit C, Vit D3, Vit B12, Lomotil, ferrous sulfate, imodium, mag-ox, remeron, MVI, fibercon. Labs reviewed: Na low, K low.   Pt rpeorts that he has a good appetite and has been eating well since admission. Pt ate 100% of his lunch today. No significant wt loss per record. Pt with significant muscle wasting. The pt is with increased nutrient needs related to wound healing. RD will add supplements and continue to monitor PO intakes.   NUTRITION - FOCUSED PHYSICAL EXAM:  Flowsheet Row Most Recent Value  Orbital Region Moderate depletion  Upper Arm Region Mild depletion  Thoracic and Lumbar Region Unable to assess  Buccal Region Mild depletion  Temple Region Moderate depletion  Clavicle Bone Region Moderate depletion  Clavicle and Acromion Bone Region Moderate  depletion  Scapular Bone Region Unable to assess  Dorsal Hand Mild depletion  Patellar Region Unable to assess  Anterior Thigh Region Unable to assess  Posterior Calf Region Unable to assess  Edema (RD Assessment) None  Hair Reviewed  Eyes Reviewed  Mouth Reviewed  Skin Reviewed  Nails Reviewed       Diet Order:   Diet Order             Diet regular Room service appropriate? Yes; Fluid consistency: Thin  Diet effective now                   EDUCATION NEEDS:   Not appropriate for education at this time  Skin:  Skin Integrity Issues:: Stage II, Stage III DTI: R arm Stage II: R ankle; left tibial; Stage III: Mid buttocks Incisions: back; abdomen; left hip Other: R wrist  Last BM:  5/13 - type 7  Height:   Ht Readings from Last 1 Encounters:  09/03/22 5\' 10"  (1.778 m)    Weight:   Wt Readings from Last 1 Encounters:  09/04/22 71 kg    Ideal Body Weight:     BMI:  Body mass index is 22.46 kg/m.  Estimated Nutritional Needs:   Kcal:  2200-2500 kcals  Protein:  110-130 gm  Fluid:  >/= 2 L/day  [name], RD, LDN, CNSC.

## 2022-09-07 LAB — CBC WITH DIFFERENTIAL/PLATELET
Abs Immature Granulocytes: 0.02 10*3/uL (ref 0.00–0.07)
Basophils Absolute: 0 10*3/uL (ref 0.0–0.1)
Basophils Relative: 0 %
Eosinophils Absolute: 0.4 10*3/uL (ref 0.0–0.5)
Eosinophils Relative: 4 %
HCT: 33.7 % — ABNORMAL LOW (ref 39.0–52.0)
Hemoglobin: 11 g/dL — ABNORMAL LOW (ref 13.0–17.0)
Immature Granulocytes: 0 %
Lymphocytes Relative: 18 %
Lymphs Abs: 1.7 10*3/uL (ref 0.7–4.0)
MCH: 26.1 pg (ref 26.0–34.0)
MCHC: 32.6 g/dL (ref 30.0–36.0)
MCV: 80 fL (ref 80.0–100.0)
Monocytes Absolute: 0.8 10*3/uL (ref 0.1–1.0)
Monocytes Relative: 9 %
Neutro Abs: 6.2 10*3/uL (ref 1.7–7.7)
Neutrophils Relative %: 69 %
Platelets: 995 10*3/uL (ref 150–400)
RBC: 4.21 MIL/uL — ABNORMAL LOW (ref 4.22–5.81)
RDW: 16.2 % — ABNORMAL HIGH (ref 11.5–15.5)
WBC: 9.1 10*3/uL (ref 4.0–10.5)
nRBC: 0 % (ref 0.0–0.2)

## 2022-09-07 LAB — BASIC METABOLIC PANEL
Anion gap: 11 (ref 5–15)
BUN: 12 mg/dL (ref 6–20)
CO2: 28 mmol/L (ref 22–32)
Calcium: 9.5 mg/dL (ref 8.9–10.3)
Chloride: 88 mmol/L — ABNORMAL LOW (ref 98–111)
Creatinine, Ser: 0.68 mg/dL (ref 0.61–1.24)
GFR, Estimated: >60 mL/min (ref >60)
Glucose, Bld: 90 mg/dL (ref 70–99)
Potassium: 4.8 mmol/L (ref 3.5–5.1)
Sodium: 127 mmol/L — ABNORMAL LOW (ref 135–145)

## 2022-09-07 MED ORDER — ENSURE ENLIVE PO LIQD
237.0000 mL | Freq: Three times a day (TID) | ORAL | Status: DC
  Administered 2022-09-08 (×3): 237 mL via ORAL

## 2022-09-07 MED ORDER — ASPIRIN 81 MG PO TBEC
81.0000 mg | DELAYED_RELEASE_TABLET | Freq: Every day | ORAL | Status: DC
  Administered 2022-09-07 – 2022-10-05 (×29): 81 mg via ORAL
  Filled 2022-09-07 (×29): qty 1

## 2022-09-07 MED ORDER — GABAPENTIN 300 MG PO CAPS
900.0000 mg | ORAL_CAPSULE | Freq: Three times a day (TID) | ORAL | Status: DC
  Administered 2022-09-07 – 2022-09-22 (×45): 900 mg via ORAL
  Filled 2022-09-07 (×45): qty 3

## 2022-09-07 MED ORDER — JUVEN PO PACK
1.0000 | PACK | Freq: Two times a day (BID) | ORAL | Status: DC
  Administered 2022-09-08 – 2022-09-23 (×30): 1 via ORAL
  Filled 2022-09-07 (×30): qty 1

## 2022-09-07 MED ORDER — DULOXETINE HCL 30 MG PO CPEP
30.0000 mg | ORAL_CAPSULE | Freq: Every day | ORAL | Status: DC
  Administered 2022-09-07 – 2022-09-09 (×3): 30 mg via ORAL
  Filled 2022-09-07 (×3): qty 1

## 2022-09-07 MED ORDER — DULOXETINE HCL 60 MG PO CPEP: 60.0000 mg | ORAL_CAPSULE | Freq: Every day | ORAL | Status: DC

## 2022-09-07 MED ORDER — TEMAZEPAM 7.5 MG PO CAPS
15.0000 mg | ORAL_CAPSULE | Freq: Every day | ORAL | Status: DC
  Administered 2022-09-07 – 2022-10-04 (×28): 15 mg via ORAL
  Filled 2022-09-07 (×28): qty 2

## 2022-09-07 MED ORDER — CYPROHEPTADINE HCL 4 MG PO TABS
4.0000 mg | ORAL_TABLET | Freq: Every day | ORAL | Status: DC
  Administered 2022-09-07 – 2022-10-04 (×28): 4 mg via ORAL
  Filled 2022-09-07 (×29): qty 1
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
## 2022-09-07 NOTE — Progress Notes (Signed)
Occupational Therapy Session Note  Patient Details  Name: NYAL EBAUGH MRN: [id number] Date of Birth: 1991-03-08  Today's Date: 09/07/2022 OT Individual Time: 8657-8469 OT Individual Time Calculation (min): 60 min  and Today's Date: 09/07/2022 OT Missed Time: 15 Minutes Missed Time Reason: Nursing care   Short Term Goals: Week 1:  OT Short Term Goal 1 (Week 1): Pt will maintain sitting balance EOB/EOM with MOD A of 1 in prep for BADLs OT Short Term Goal 2 (Week 1): Pt will groom with MIN A to demo ipmroved bimanual coordination OT Short Term Goal 3 (Week 1): Pt will complete 1/4 steps of UB dressing  Skilled Therapeutic Interventions/Progress Updates:  Pt received resting in bed for skilled OT session with focus on BADL retraining and functional transfers. Pt agreeable to interventions, demonstrating overall pleasant mood. Pt reported 4/10 pain, stating ". . . But that's good for me" in reference to pain. Pt medicated during session.  OT offering intermediate rest breaks and positioning suggestions throughout session to address pain/fatigue and maximize participation/safety in session.   Pt's breakfasts arrives late, time spent encouraging intake and assessing functional use of BUE for self-feeding. Pt currently using R-hand as gross support for stabilization of food items, continuing to have difficulty with fine motor tasks such as opening containers. Communication form posted outside of patient's door for nursing staff to assist with setup. Pt issued adaptive mug and built-up foam to improve efficiency of R-hand for self-feeding. Pt also issued padded sleeve for R-elbow and B-heels for improved skin integrity. Pt dons shirt with Min A at bed-level.    Pt remained resting in bed bilateral heels and L-hip offloaded with pillows. All immediate needs met at end of session. Pt continues to be appropriate for skilled OT intervention to promote further functional independence.   Therapy  Documentation Precautions:  Precautions Precautions: Fall, Other (comment) Precaution Comments: L JP drain, R colostomy/ileostomy, abdominal wound, R foot/ankle wound Required Braces or Orthoses: Other Brace Other Brace: B LE PRAFOs Restrictions Weight Bearing Restrictions: Yes RUE Weight Bearing: Weight bear through elbow only RLE Weight Bearing: Weight bearing as tolerated LLE Weight Bearing: Weight bearing as tolerated Other Position/Activity Restrictions: R UE no lifting > 5 lbs; no ROM restrictions in R UE  Therapy/Group: Individual Therapy  Lou Cal, OTR/L, MSOT  09/07/2022, 6:20 AM

## 2022-09-07 NOTE — Progress Notes (Signed)
Physical Therapy Session Note  Patient Details  Name: Shane Klein MRN: [id number] Date of Birth: 04/18/1991  Today's Date: 09/07/2022 PT Individual Time: 1355-1534 PT Individual Time Calculation (min): 99 min   Short Term Goals: Week 1:  PT Short Term Goal 1 (Week 1): Pt will perform rolling R/L with min assist PT Short Term Goal 2 (Week 1): Pt will perform supine<>sit with max assist of 1 PT Short Term Goal 3 (Week 1): Pt will maintain static sitting balance EOM for at least 2 minutes with no more than supervision PT Short Term Goal 4 (Week 1): Pt will progress to standing tolerance in tilt table or harness support for at least 5 minutes PT Short Term Goal 5 (Week 1): Pt will transfer bed<>chair with +2 mod assist using LRAD  Skilled Therapeutic Interventions/Progress Updates:      Therapy Documentation Precautions:  Precautions Precautions: Fall, Other (comment) Precaution Comments: L JP drain, R colostomy/ileostomy, abdominal wound, R foot/ankle wound Required Braces or Orthoses: Other Brace Other Brace: B LE PRAFOs Restrictions Weight Bearing Restrictions: Yes RUE Weight Bearing: Weight bear through elbow only RLE Weight Bearing: Weight bearing as tolerated LLE Weight Bearing: Weight bearing as tolerated Other Position/Activity Restrictions: R UE no lifting > 5 lbs; no ROM restrictions in R UE  Pt received semi-reclined in bed and agreeable to PT session with emphasis on w/c mobility and transfer training. Pt with unrated left hip and bilateral foot pain, nurse notified and administered pain medications and pt reports relief.   Pt able to mobilize R LE off bed and requires assistance for L LE management to transition from supine to right side lying. Pt able to weight bear through right elbow and transition from S/L to sitting with min A.   Pt total A for slide board placement and max A x 1 with slide board transfer to manual w/c. PT provided towels on foot rest for  comfort as pt reports bilateral foot pain on leg rests.   Pt propelled w/c ~150 ft CGA/min A and requires total A for transportation of remaining distance to therapy gym. PT applied green thera band on inner right rim of manual wheelchair to assist with grip for propulsion. Pt demonstrates improved w/c propulsion with theraband technique.   Pt participated in blocked practice of lateral scooting transfers without use of slide board. Pt with varying levels of assist and initially required mod/max A. With increased repetitions pt requires CGA/min A for lateral scooting transfer to the left side.   In supine position pt able to perform gluteal sets x 5 and transition to gluteal bridges x 3 with minimal buttock clearance. Pt encouraged by increased posterior chain activation.   Pt transported to room by w/c for time management and requires max A x 1 for scooting transfer to the right to [facility] bed. Pt left semi-reclined in bed with all needs in reach.    Therapy/Group: Individual Therapy  Truitt Leep Truitt Leep PT, DPT  09/07/2022, 7:48 AM
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
## 2022-09-07 NOTE — Progress Notes (Signed)
Met with patient. Aware of team meeting today.  Shane Klein that his mom has an appointment Wednesday to go over the ostomy with her. Patient very pleasant. Discussed educational binder. Reports that he has been reading it.  Book on bedside table.  Patient has no questions at this time.  All needs met. Call bell in reach.

## 2022-09-07 NOTE — Progress Notes (Signed)
Physical Therapy Session Note  Patient Details  Name: Shane Klein MRN: [id number] Date of Birth: January 11, 1991  Today's Date: 09/07/2022 PT Individual Time: 1108-1210 PT Individual Time Calculation (min): 62 min   Short Term Goals: Week 1:  PT Short Term Goal 1 (Week 1): Pt will perform rolling R/L with min assist PT Short Term Goal 2 (Week 1): Pt will perform supine<>sit with max assist of 1 PT Short Term Goal 3 (Week 1): Pt will maintain static sitting balance EOM for at least 2 minutes with no more than supervision PT Short Term Goal 4 (Week 1): Pt will progress to standing tolerance in tilt table or harness support for at least 5 minutes PT Short Term Goal 5 (Week 1): Pt will transfer bed<>chair with +2 mod assist using LRAD  Skilled Therapeutic Interventions/Progress Updates:    Pt received supine in bed awake and agreeable to therapy session. Planning to use Kreg bed tilt feature for progression with standing; however, realized bed does not have straps - asked RN to call Kreg bed representative and found out that pt would need the Kreg "Catalyst" bed to use the standing feature - made primary PT aware.  Pt still agreeable to progression with standing using tilt table.  Supine lateral scoot transfers to/from bed <> tilt table with +2 mod assist using bed pad to assist with hip clearance and therapist manually facilitating with B LE management - slow, small scoots for pt safety and pain management.  Vitals assessed throughout as follows:  Supine on tilt table: BP 101/68 (MAP 79), HR 77bpm  20degrees elevation: BP 103/68 (MAP 80), HR 81bpm  30degrees elevation: BP 101/71 (MAP 82), HR 85bpm  60degrees elevation: BP 103/73 (MAP 84), HR 100bpm  Pt not reporting any symptoms throughout except for getting a little hot and feeling a little sweaty but vitals stable - provided cool washcloth.  Pt tolerated being upright on tilt table for at least 10-43minutes. Once at 60degrees progressed to  partial mini-squats with therapist facilitating B LEs into knee flexion and pt able to activate bilateral quads to extend knees back fully x10reps. Pt also achieving prolonged heel cord stretch bilaterally.   At end of session, pt left supine in bed with needs in reach and lines intact. With NT aware of pt's position.   Therapy Documentation Precautions:  Precautions Precautions: Fall, Other (comment) Precaution Comments: L JP drain, R colostomy/ileostomy, abdominal wound, R foot/ankle wound Required Braces or Orthoses: Other Brace Other Brace: B LE PRAFOs Restrictions Weight Bearing Restrictions: Yes RUE Weight Bearing: Weight bear through elbow only RLE Weight Bearing: Weight bearing as tolerated LLE Weight Bearing: Weight bearing as tolerated Other Position/Activity Restrictions: R UE no lifting > 5 lbs; no ROM restrictions in R UE   Pain:  Reports he can feel "pressure" in bilateral heel cords with progression to upright, standing posture on tilt table. Gradual increase in elevation for pain tolerance.    Therapy/Group: Individual Therapy  Ginny Forth , PT, DPT, NCS, CSRS 09/07/2022, 8:02 AM

## 2022-09-08 DIAGNOSIS — F4320 Adjustment disorder, unspecified: Secondary | ICD-10-CM

## 2022-09-08 DIAGNOSIS — F4329 Adjustment disorder with other symptoms: Secondary | ICD-10-CM

## 2022-09-08 MED ORDER — DOXAZOSIN MESYLATE 2 MG PO TABS
1.0000 mg | ORAL_TABLET | Freq: Two times a day (BID) | ORAL | Status: DC
  Administered 2022-09-08 – 2022-09-09 (×4): 1 mg via ORAL
  Filled 2022-09-08 (×4): qty 1

## 2022-09-08 MED ORDER — FLUDROCORTISONE ACETATE 0.1 MG PO TABS
0.1000 mg | ORAL_TABLET | Freq: Every day | ORAL | Status: DC
  Administered 2022-09-08 – 2022-10-05 (×28): 0.1 mg via ORAL
  Filled 2022-09-08 (×28): qty 1
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
## 2022-09-08 NOTE — Progress Notes (Signed)
Physical Therapy Session Note  Patient Details  Name: Shane Klein MRN: [id number] Date of Birth: April 20, 1991  Today's Date: 09/08/2022 PT Individual Time: 5621-3086 PT Individual Time Calculation (min): 40 min   Short Term Goals: Week 1:  PT Short Term Goal 1 (Week 1): Pt will perform rolling R/L with min assist PT Short Term Goal 2 (Week 1): Pt will perform supine<>sit with max assist of 1 PT Short Term Goal 3 (Week 1): Pt will maintain static sitting balance EOM for at least 2 minutes with no more than supervision PT Short Term Goal 4 (Week 1): Pt will progress to standing tolerance in tilt table or harness support for at least 5 minutes PT Short Term Goal 5 (Week 1): Pt will transfer bed<>chair with +2 mod assist using LRAD  Skilled Therapeutic Interventions/Progress Updates:    Pt received resting supine in bed and reports feeling lethargic, but despite this is agreeable to therapy session with goal of using tilt feature of new [employer] Kreg bed to work on B LE WBing and progression to standing.   Therapist donned Kreg bed straps to allow safe progression to standing. Pt remains lethargic throughout session with heavy eyelids, benefiting from continuous/repeated auditory stimulus to maintain arousal and increase engagement in the activity.   Vitals monitored throughout as follows:  Spine in bed to start: BP 103/65 (MAP 78), HR 82bpm  At 23degrees elevation: BP 106/73 (MAP 83), HR 98bpm  At 40degrees: BP 100/77 (MAP 84), HR 103bpm  After ~53minutes at 40degrees: BP 104/74 (MAP 83), HR 105bpm  At 60degrees: BP 98/79 (MAP 86), HR 119bpm  Had pt perform B UE exercises to increase BP then reassessed at 60degrees: BP 112/76 (MAP 87), HR 78bpm  Progressed to 70degrees: BP 121/81 (MAP 94), HR 123bpm with pt reporting feeling hot - provided ice water Returned to supine and vitals: BP 103/74 (MAP 85), HR 101bpm recovering to 88bpm   Pt able to tolerate standing at progressively increased  angles as described above for ~72minutes. Once pt at 60-70degrees of tilt his feet started sliding forward on foot plate requiring readjustment to maintain his safety. Pt continues to demo activation in B LE quads with ability to extend knees and push himself up from partial mini-squat position in a more upright, WBing position today x5-10 reps, therapist facilitates knee flexion to achieve this movement.   At end of session, pt left supine in bed resting, quickly falling asleep, with needs in reach, lines intact, and bedrails up per air mattress protocol.   Therapy Documentation Precautions:  Precautions Precautions: Fall, Other (comment) Precaution Comments: L JP drain, R colostomy/ileostomy, abdominal wound, R foot/ankle wound Required Braces or Orthoses: Other Brace Other Brace: B LE PRAFOs Restrictions Weight Bearing Restrictions: Yes RUE Weight Bearing: Weight bear through elbow only RLE Weight Bearing: Weight bearing as tolerated LLE Weight Bearing: Weight bearing as tolerated Other Position/Activity Restrictions: R UE no lifting > 5 lbs; no ROM restrictions in R UE   Pain:  Continues to report sensitivity in bilateral feet with discomfort during heel cord stretch, but tolerable, therapist adjusted facilitation techniques to minimize contact to feet as able.    Therapy/Group: Individual Therapy  Ginny Forth , PT, DPT, NCS, CSRS 09/08/2022, 5:55 PM
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
## 2022-09-08 NOTE — Progress Notes (Signed)
Physical Therapy Session Note  Patient Details  Name: Shane Klein MRN: [id number] Date of Birth: [birth year]  Today's Date: 09/08/2022 PT Individual Time: 1445-1550, 4098-1191  PT Individual Time Calculation (min): 65 min , 23 min   Short Term Goals: Week 1:  PT Short Term Goal 1 (Week 1): Pt will perform rolling R/L with min assist PT Short Term Goal 2 (Week 1): Pt will perform supine<>sit with max assist of 1 PT Short Term Goal 3 (Week 1): Pt will maintain static sitting balance EOM for at least 2 minutes with no more than supervision PT Short Term Goal 4 (Week 1): Pt will progress to standing tolerance in tilt table or harness support for at least 5 minutes PT Short Term Goal 5 (Week 1): Pt will transfer bed<>chair with +2 mod assist using LRAD  Skilled Therapeutic Interventions/Progress Updates:      Therapy Documentation Precautions:  Precautions Precautions: Fall, Other (comment) Precaution Comments: L JP drain, R colostomy/ileostomy, abdominal wound, R foot/ankle wound Required Braces or Orthoses: Other Brace Other Brace: B LE PRAFOs Restrictions Weight Bearing Restrictions: Yes RUE Weight Bearing: Weight bear through elbow only RLE Weight Bearing: Weight bearing as tolerated LLE Weight Bearing: Weight bearing as tolerated Other Position/Activity Restrictions: R UE no lifting > 5 lbs; no ROM restrictions in R UE  Treatment Session 1:   Pt received seated in TIS w/c at bedside and with unrated back and bilateral foot pain in session, provided repositioning for relief. Pt transported by w/c total A for time management and energy conservation to main gym in w/c. PT retrieved second hybrid roho cushion for TIS w/c and appropriately performed set-up of cushion. Pt requires mod A x 1 with squat pivot to mat and requires close (S) for static sitting balance. Pt reports need to void and PT retrieved urinal and pt voided 200 ml, documented by nurse. Pt agreeable to transition to  STS transfers and requires max A x 1. In standing position PT observed pt's pants to be soiled from leakage from urinal position from void. Pt requires max A x 1 with sit to lying and total A for lower body dressing. Pt able to perform peri-care with min A. Pt able to perform gluteal bridge with minimal clearance to allow PT to pull up pants. PT observed ostomy leakage in supine position and notified nurse. Nurse present and performed necessary ostomy and wound care.    Treatment Session 2:  Pt received after nursing care and requires max A x 1 with supine to sit transfer and squat pivot transfer to the left to ultra light weight manual w/c. Pt transported to room by w/c for time management and energy conservation. Pt requires +2 for unlevel transfer from w/c to bed and max A x 1 with sit to lying. Pt left semi-reclined in bed with all needs in reach. Pt denies foot pain with mobility in second session.    Therapy/Group: Individual Therapy  Truitt Leep Truitt Leep PT, DPT  09/08/2022, 4:53 PM
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
## 2022-09-08 NOTE — Progress Notes (Signed)
Physical Therapy Session Note  Patient Details  Name: Shane Klein MRN: [id number] Date of Birth: February 13, 1991  Today's Date: 09/08/2022 PT Individual Time: 1128-1212 PT Individual Time Calculation (min): 44 min   Short Term Goals: Week 1:  PT Short Term Goal 1 (Week 1): Pt will perform rolling R/L with min assist PT Short Term Goal 2 (Week 1): Pt will perform supine<>sit with max assist of 1 PT Short Term Goal 3 (Week 1): Pt will maintain static sitting balance EOM for at least 2 minutes with no more than supervision PT Short Term Goal 4 (Week 1): Pt will progress to standing tolerance in tilt table or harness support for at least 5 minutes PT Short Term Goal 5 (Week 1): Pt will transfer bed<>chair with +2 mod assist using LRAD  Skilled Therapeutic Interventions/Progress Updates:      Therapy Documentation Precautions:  Precautions Precautions: Fall, Other (comment) Precaution Comments: L JP drain, R colostomy/ileostomy, abdominal wound, R foot/ankle wound Required Braces or Orthoses: Other Brace Other Brace: B LE PRAFOs Restrictions Weight Bearing Restrictions: Yes RUE Weight Bearing: Weight bear through elbow only RLE Weight Bearing: Weight bearing as tolerated LLE Weight Bearing: Weight bearing as tolerated Other Position/Activity Restrictions: R UE no lifting > 5 lbs; no ROM restrictions in R UE  Pt received seated in TIS w/c at bedside with nurse present for ostomy education. Pt unsatisfied with ostomy change and would prefer to utilize the "pouch". PT notified nurse and case manager. PT provided pt and family education regarding SCI diagnosis per patient's mother's request. Pt agreeable to practice blocked practice of sit to stand transfers with PT positioned anterior to patient to provide anterior knee block and mod assist to facilitate posterior chain activation. Pt performed ~4 stands and able to tolerate upright position ~15 seconds prior to sitting. Pt denies dizziness  or lightheadedness. Pt left seated in TIS w/c at bedside with all needs in reach.      Therapy/Group: Individual Therapy  Truitt Leep Truitt Leep PT, DPT  09/08/2022, 7:36 AM

## 2022-09-08 NOTE — Progress Notes (Signed)
Occupational Therapy Session Note  Patient Details  Name: Shane Klein MRN: [id number] Date of Birth: Oct 24, 1990  Today's Date: 09/08/2022 OT Individual Time: 1005-1055 OT Individual Time Calculation (min): 50 min  Today's Date: 09/08/2022 OT Missed Time: 10 Minutes Missed Time Reason: Nursing care  Today's Date: 09/08/2022 OT Missed Time: 30 Minutes Missed Time Reason: Other (comment) (Eating lunch)  Short Term Goals: Week 1:  OT Short Term Goal 1 (Week 1): Pt will maintain sitting balance EOB/EOM with MOD A of 1 in prep for BADLs OT Short Term Goal 2 (Week 1): Pt will groom with MIN A to demo ipmroved bimanual coordination OT Short Term Goal 3 (Week 1): Pt will complete 1/4 steps of UB dressing  Skilled Therapeutic Interventions/Progress Updates:   Session 1: Pt's breakfast arriving late, pt requesting OT to return later to allow for time to eat. Pt later received resting in bed for skilled OT session with focus on bed mobility, skin integrity, OOB activity, and upright tolerance. Pt agreeable to interventions, despite overall fatigue/lethargy. Pt with un-rated pain, medicated during session. OT offering intermediate rest breaks and positioning suggestions throughout session to address pain/fatigue and maximize participation/safety in session.   Pt dependent for donning socks, rolling R for nursing skin assessment with Min A for BLE management. Time dedicated to removal of condom cath with LPN present, pt then with urgency, instructed in urinal use requiring increased time to initiate void with no success.  Pt transitions to EOB with A for BLE management and trunk elevation. Seated EOB, pt instructed in squat pivot technique, completing transfer from EOB>TIS WC with Mod A and + 2 present for safety.   Pt remained sitting in TIS WC with all immediate needs met at end of session. Pt continues to be appropriate for skilled OT intervention to promote further functional independence.    Session 2: Pt missing 30 minutes of skilled OT intervention due to late lunch. Time to be made up as appropriate and schedule permits.   Therapy Documentation Precautions:  Precautions Precautions: Fall, Other (comment) Precaution Comments: L JP drain, R colostomy/ileostomy, abdominal wound, R foot/ankle wound Required Braces or Orthoses: Other Brace Other Brace: B LE PRAFOs Restrictions Weight Bearing Restrictions: Yes RUE Weight Bearing: Weight bear through elbow only RLE Weight Bearing: Weight bearing as tolerated LLE Weight Bearing: Weight bearing as tolerated Other Position/Activity Restrictions: R UE no lifting > 5 lbs; no ROM restrictions in R UE   Therapy/Group: Individual Therapy  Lou Cal, OTR/L, MSOT  09/08/2022, 6:05 AM

## 2022-09-09 MED ORDER — METOPROLOL TARTRATE 12.5 MG HALF TABLET
12.5000 mg | ORAL_TABLET | Freq: Two times a day (BID) | ORAL | Status: DC
  Administered 2022-09-09 – 2022-09-11 (×4): 12.5 mg via ORAL
  Filled 2022-09-09 (×4): qty 1

## 2022-09-09 NOTE — Progress Notes (Signed)
Changed ileostomy x3 during this shift. Notified MD    Tilden Dome, LPN

## 2022-09-09 NOTE — Progress Notes (Addendum)
Occupational Therapy Session Note  Patient Details  Name: Shane Klein MRN: [id number] Date of Birth: 1990-05-05  Today's Date: 09/09/2022 OT Individual Time: 1120-1205 1st Session; 1300-1330  2nd Session no OT therapy billed due to PT billing occurring for w/c eval session  OT Individual Time Calculation (min): 45 min, 30 min unbilled   Short Term Goals: Week 1:  OT Short Term Goal 1 (Week 1): Pt will maintain sitting balance EOB/EOM with MOD A of 1 in prep for BADLs OT Short Term Goal 2 (Week 1): Pt will groom with MIN A to demo ipmroved bimanual coordination OT Short Term Goal 3 (Week 1): Pt will complete 1/4 steps of UB dressing  Skilled Therapeutic Interventions/Progress Updates:   Session 1:   Pt seen for skilled OT session. Pt completing ostomy care with nursing bed level upon OT arrival with mom bedside as well. Pt required LE garment donning and OT facilitated bed level with ostomy bag fed thru and pt assisting with rolling to pull up. Pt elected to remain in bed due to fatigue and requested OT to assist with OOB for next session which is w/c eval after lunch. Pt eager to trial [employer] for R wrist and finger extensors. Pt currently with 15 degrees AROM wrist ext and 3/4 full finger ext except for 2nd digit with 1/4 range extension. All joints stiff for flexion/grasp. Provided moist heat to R wrist and hand then application of estim with + results for full ranges for 10 min application. Worked with left UE on proximal and distal strength and issued and trained in foam block resistance cubes bothmedium and medium-firm for left hand and then once estim removed right hand with light resistance. Pt with significant mm atrophy in right thenar region therefore educated in importance of addressing radial nerve deficits between visits via simple HEP. Both pt and mother verbalized understanding.   Pt estim response and parameters:   no adverse skin reactions.  330 pulse width 35 Hz  pulse rate On 8 sec/ off 8 sec Ramp up/ down 2 sec Symmetrical Biphasic wave form  Max intensity at 500 Ohm load  Pain:  Mild un-rated abdominal pain post ostomy change with releif with rest.   Session 2:  OT session for consultation purposes only with PT completing therapy intervention and billing for session for custom w/c assessment. OT input with Jason ATP from [employer] and PT for recs for [employer] custom w/c with custom back- [employer] and wheel rim non-slip coating. OT in agreement to trial a custom chair provided by [employer] prior to final decisions. Left pt with PT and ATP after 30 min consult.   Therapy Documentation Precautions:  Precautions Precautions: Fall, Other (comment) Precaution Comments: L JP drain, R colostomy/ileostomy, abdominal wound, R foot/ankle wound Required Braces or Orthoses: Other Brace Other Brace: B LE PRAFOs Restrictions Weight Bearing Restrictions: Yes RUE Weight Bearing: Weight bear through elbow only RLE Weight Bearing: Weight bearing as tolerated LLE Weight Bearing: Weight bearing as tolerated Other Position/Activity Restrictions: R UE no lifting > 5 lbs; no ROM restrictions in R UE  Vicenta Dunning 09/09/2022, 7:44 AM

## 2022-09-09 NOTE — Progress Notes (Signed)
Occupational Therapy Session Note  Patient Details  Name: AEDEN HASSINGER MRN: [id number] Date of Birth: [birth year]  Today's Date: 09/10/2022 OT Individual Time: 2130-8657 OT Individual Time Calculation (min): 50 min  and Today's Date: 09/10/2022 OT Missed Time: 10 Minutes Missed Time Reason: Nursing care  Today's Date: 09/10/2022 OT Individual Time: 1300-1405 OT Individual Time Calculation (min): 65 min   Short Term Goals: Week 1:  OT Short Term Goal 1 (Week 1): Pt will maintain sitting balance EOB/EOM with MOD A of 1 in prep for BADLs OT Short Term Goal 2 (Week 1): Pt will groom with MIN A to demo ipmroved bimanual coordination OT Short Term Goal 3 (Week 1): Pt will complete 1/4 steps of UB dressing  Skilled Therapeutic Interventions/Progress Updates:   Session 1: Pt received receiving nursing care for leaking ostomy bag. Session focused on BADL retraining at bed-level due to time constraints. Pt agreeable to interventions, demonstrating overall pleasant mood. Pt with un-rated pain. OT offering intermediate rest breaks and positioning suggestions throughout session to address pain/fatigue and maximize participation/safety in session.  Pt completes UB bathing/dressing with Min A for material management. Pt requires Min-Mod A for LB bathing/dressing, demonstrating ability to lift RLE to assist with threading and swing LLE towards right side for bed mobility. Pt continues to express increased sensitivity in B-feet, but shares it has improved some. Pt completes oral care with setup A.   Pt remained resting in bed with all immediate needs met at end of session. Pt continues to be appropriate for skilled OT intervention to promote further functional independence.   Session 2: Pt received resting in bed for skilled OT session with focus on functional transfers and sink-side grooming. Pt agreeable to interventions, demonstrating overall pleasant mood. Pt with intermediate reports of pain in  bilateral feet. OT offering intermediate rest breaks and positioning suggestions throughout session to address pain/fatigue and maximize participation/safety in session.   Pt dependent for donning socks, coming to EOB with increased time and Min A. Pt performs squat pivot transfer towards R-side with heavy Mod A +cuing for technique. Sitting in TIS WC, pt dependent for hair washing to promote skin integrity and overall quality of life.  Pt remained sitting in TIS WC with all immediate needs met at end of session. Pt continues to be appropriate for skilled OT intervention to promote further functional independence.   Therapy Documentation Precautions:  Precautions Precautions: Fall, Other (comment) Precaution Comments: L JP drain, R colostomy/ileostomy, abdominal wound, R foot/ankle wound Required Braces or Orthoses: Other Brace Other Brace: B LE PRAFOs Restrictions Weight Bearing Restrictions: Yes RUE Weight Bearing: Weight bear through elbow only RLE Weight Bearing: Weight bearing as tolerated LLE Weight Bearing: Weight bearing as tolerated Other Position/Activity Restrictions: R UE no lifting > 5 lbs; no ROM restrictions in R UE   Therapy/Group: Individual Therapy  Lou Cal, OTR/L, MSOT  09/10/2022, 12:21 PM

## 2022-09-09 NOTE — Plan of Care (Signed)
  Problem: Consults Goal: RH GENERAL PATIENT EDUCATION Description: See Patient Education module for education specifics. Outcome: Progressing   Problem: RH BOWEL ELIMINATION Goal: RH STG MANAGE BOWEL WITH ASSISTANCE Description: STG Manage Bowel with mod I Assistance. Outcome: Progressing Goal: RH STG MANAGE BOWEL W/MEDICATION W/ASSISTANCE Description: STG Manage Bowel with Medication with mod I Assistance. Outcome: Progressing   Problem: RH BLADDER ELIMINATION Goal: RH STG MANAGE BLADDER WITH ASSISTANCE Description: STG Manage Bladder With mod I Assistance Outcome: Progressing Goal: RH STG MANAGE BLADDER WITH MEDICATION WITH ASSISTANCE Description: STG Manage Bladder With Medication With mod I Assistance. Outcome: Progressing   Problem: RH SKIN INTEGRITY Goal: RH STG SKIN FREE OF INFECTION/BREAKDOWN Description: Manage independently Outcome: Progressing Goal: RH STG MAINTAIN SKIN INTEGRITY WITH ASSISTANCE Description: STG Maintain Skin Integrity With min Assistance. Outcome: Progressing Goal: RH STG ABLE TO PERFORM INCISION/WOUND CARE W/ASSISTANCE Description: STG Able To Perform Incision/Wound Care With min Assistance. Outcome: Progressing   Problem: RH SAFETY Goal: RH STG ADHERE TO SAFETY PRECAUTIONS W/ASSISTANCE/DEVICE Description: STG Adhere to Safety Precautions With cues Assistance/Device. Outcome: Progressing   Problem: RH PAIN MANAGEMENT Goal: RH STG PAIN MANAGED AT OR BELOW PT'S PAIN GOAL Description: < 4 with prns Outcome: Progressing   Problem: RH KNOWLEDGE DEFICIT GENERAL Goal: RH STG INCREASE KNOWLEDGE OF SELF CARE AFTER HOSPITALIZATION Description: Patient and family will be able to manage care at discharge using educational resources independently Outcome: Progressing
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
## 2022-09-09 NOTE — Progress Notes (Signed)
Physical Therapy Session Note  Patient Details  Name: Shane Klein MRN: [id number] Date of Birth: 1990-12-07  Today's Date: 09/09/2022 PT Individual Time: 0915-1027, [id number]  PT Individual Time Calculation (min): 72 min , 75 min   Short Term Goals: Week 1:  PT Short Term Goal 1 (Week 1): Pt will perform rolling R/L with min assist PT Short Term Goal 2 (Week 1): Pt will perform supine<>sit with max assist of 1 PT Short Term Goal 3 (Week 1): Pt will maintain static sitting balance EOM for at least 2 minutes with no more than supervision PT Short Term Goal 4 (Week 1): Pt will progress to standing tolerance in tilt table or harness support for at least 5 minutes PT Short Term Goal 5 (Week 1): Pt will transfer bed<>chair with +2 mod assist using LRAD  Skilled Therapeutic Interventions/Progress Updates:      Therapy Documentation Precautions:  Precautions Precautions: Fall, Other (comment) Precaution Comments: L JP drain, R colostomy/ileostomy, abdominal wound, R foot/ankle wound Required Braces or Orthoses: Other Brace Other Brace: B LE PRAFOs Restrictions Weight Bearing Restrictions: Yes RUE Weight Bearing: Weight bear through elbow only RLE Weight Bearing: Weight bearing as tolerated LLE Weight Bearing: Weight bearing as tolerated Other Position/Activity Restrictions: R UE no lifting > 5 lbs; no ROM restrictions in R UE  Treatment Session 1:   Pt received semi-reclined in bed, agreeable to PT session with emphasis on NMR to facilitate left quad activation with electrical stimulation. Pt connected to condom catheter from PM and PT notified nursing and pt was disconnected. Pt continent with void of 200 ml in urinal, documented in flowsheet. PT obtained SAEBO and NMES unit for electrical stimulation. Pt utilized Saebo on left quadriceps muscle and pt with minimal activation. Pt able to perform quad set ~5 times, unable to perform SAQ. PT transitioned to NMES electrical stimulation  with IFC configuration at intensity of 25 for 10 minutes and pt with increased muscle activation. Pt reported itching sensation, PT deferred further usage. Skin assessed and intact following intervention.    Saebo Stim One 330 pulse width 35 Hz pulse rate On 8 sec/ off 8 sec Ramp up/ down 2 sec Symmetrical Biphasic wave form  Max intensity at 500 Ohm load  Pt left in care of nursing for ostomy management.   Treatment Session 2:   Pt received semi-reclined in bed requesting nurse for ostomy management. ATP, Fleet Contras, present for ultra light weight manual wheelchair evaluation with PT and OT. Based on pt's current mobility status ATP recommending:   Aero T (16 x 18 )   Joy Extreme Cushion   Tag 3 Back 4"  Tippers   Swing Away Arm rests   80 degree bend   Plastic Coating Hand rims  ATP plans to return Thursday 5/23 to trial multiple loaner chair to determine appropriate fit and pt agreeable.   Pt transitioned to mobility training and requires CGA with supine to sit with use of bed features with active hip abduction of the left lower extremity. Pt max A x 1 with squat pivot transfer to ultra light weight manual w/c. Pt propelled wheelchair ~300 ft to ortho gym with (S) for safety.   Pt participated in blocked practice of sit to stand transfers x 3 requiring mod A with PT anterior to patient to block knees and facilitate activation of posterior chain. Pt requires mod A for static stance and able to perform ~5 mini squats with multimodal cues.   Pt  transported by w/c dependently to room and +2 for squat pivot uphill transfer to bed. Pt left semi-reclined in bed with all needs in reach.    Therapy/Group: Individual Therapy  Truitt Leep Truitt Leep PT, DPT  09/09/2022, 7:40 AM

## 2022-09-09 NOTE — Progress Notes (Addendum)
Changed ileostomy x3 this shift.. MD aware.   Tilden Dome, LPN

## 2022-09-10 MED ORDER — DOXAZOSIN MESYLATE 2 MG PO TABS
2.0000 mg | ORAL_TABLET | Freq: Two times a day (BID) | ORAL | Status: DC
  Administered 2022-09-10 – 2022-10-05 (×51): 2 mg via ORAL
  Filled 2022-09-10 (×51): qty 1

## 2022-09-10 MED ORDER — DULOXETINE HCL 60 MG PO CPEP
60.0000 mg | ORAL_CAPSULE | Freq: Every day | ORAL | Status: DC
  Administered 2022-09-10 – 2022-09-11 (×2): 60 mg via ORAL
  Filled 2022-09-10 (×2): qty 1

## 2022-09-10 NOTE — Progress Notes (Signed)
Patient ID: DAIRL NICOL, male   DOB: 1990-05-12, 32 y.o.   MRN: [id number]  Met with pt and Mom who was present in his room to let know [facility] will need to re-schedule his SS appointment and will reach out to Mom since pt does not have a cell phone and the room phone he is not always in there. Both agreeable to this.
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
## 2022-09-10 NOTE — Progress Notes (Signed)
Physical Therapy Session Note  Patient Details  Name: Shane Klein MRN: [id number] Date of Birth: 05/21/1990  Today's Date: 09/10/2022 PT Individual Time: 4098-1191 PT Individual Time Calculation (min): 59 min   Short Term Goals: Week 1:  PT Short Term Goal 1 (Week 1): Pt will perform rolling R/L with min assist PT Short Term Goal 2 (Week 1): Pt will perform supine<>sit with max assist of 1 PT Short Term Goal 3 (Week 1): Pt will maintain static sitting balance EOM for at least 2 minutes with no more than supervision PT Short Term Goal 4 (Week 1): Pt will progress to standing tolerance in tilt table or harness support for at least 5 minutes PT Short Term Goal 5 (Week 1): Pt will transfer bed<>chair with +2 mod assist using LRAD  Skilled Therapeutic Interventions/Progress Updates:    Therapist presents to room at 1050 for scheduled 1045am session, pt presents in room in bed, reporting ostomy leaking and having wound care appointment at 11am to address, not agreeable to PT at this time.  Therapist returns at 1446 to make up missed session from earlier, pt presents in room in [facility] and agreeable to PT. Pt requesting to empty ostomy prior to leaving room, pt directs therapist in emptying ostomy for carryover to self management of ostomy at beginning and end of session, values charted in flowsheet. Session focused on NMR for BLE muscle fiber recruitment and mass practice transfer training for BLE strengthening.  Pt transported dependently in WC to main gym and positioned in //bars. Pt completes mass practice sit<>stand x5 with LUE support on //bars, R elbow positioned on therapist therapist blocking bilateral knees to transition to stand and facilitating terminal hip extension, pt demonstrating terminal knee extension bilaterally. Pt able to maintain stand with mod progress to min assist with repetition for ~30-45 seconds with each stand. Pt provided with rest break between trials for energy  conservation and improved quality with task. Pt educated during rest breaks about hypersensitivity and tightness secondary to injury with pt verbalizing understanding.  Pt completes NMR seated in WC to BLEs with tactile cues provided to musculature for improved muscle fiber recruitment including: LAQs x10 BLE (light active assist for LLE for initiation) Hip flexion x10 BLE (heavy active assist for LLE)  Pt returned to room and completes squat pivot transfer to L WC to bed, max assist for gluteal clearance and anterior wt shift, good recall of head/hip relationship. Max assist for sit to supine for BLEs into bed and repositioning once supine. Therapist dons PRAFOs bilaterally and increased time required for comfort measures once supine. Pt remains with all needs within reach, call light in place at end of session. Pt mother present at bedside and NT present at end of session.  Therapy Documentation Precautions:  Precautions Precautions: Fall, Other (comment) Precaution Comments: L JP drain, R colostomy/ileostomy, abdominal wound, R foot/ankle wound Required Braces or Orthoses: Other Brace Other Brace: B LE PRAFOs Restrictions Weight Bearing Restrictions: Yes RUE Weight Bearing: Weight bear through elbow only RLE Weight Bearing: Weight bearing as tolerated LLE Weight Bearing: Weight bearing as tolerated Other Position/Activity Restrictions: R UE no lifting > 5 lbs; no ROM restrictions in R UE General: PT Amount of Missed Time (min): 16 Minutes (make up 59 out of 75 minutes from missed AM session secondary to wound care appointment scheduled during AM session) PT Missed Treatment Reason: Nursing care;Wound care  Therapy/Group: Individual Therapy  Edwin Cap PT, DPT 09/10/2022, 3:51 PM

## 2022-09-10 NOTE — Consult Note (Addendum)
WOC Nurse ostomy follow up Stoma type/location: RUQ ileostomy  Stomal assessment/size: 1 1/4" pale pink moist, round, above skin level  Peristomal assessment: skin appearance much improved, minimal irritant contact dermatitis noted at 8-10 o'clock Treatment options for stomal/peristomal skin: crusted from 6 o'clock to 10 o'clock with stoma powder and no sting barrier wipe, 2" skin barrier ring [facility] [place] [phone])  Output 300 mls brown pasty stool  Ostomy pouching: 2 piece 2 1/4" skin barrier [facility] [place] [phone]), 2 1/4" close end pouch [facility] [place] [phone]) and 2" skin barrier ring [facility] [place] [phone])  Education provided: Mother present for education. Patient on phone during visit.  We assembled all needed materials and discussed the rationale behind ileostomies generally having more liquid stool.  Old pouch leaking. Did have mother empty current pouch using lock and roll mechanism, opened, cleaned spout and closed pouch.  Mother used push pull technique to remove old pouch.  We discussed cleaning around stoma with water only.  Patient asked about applying Vaseline to area.  I educated patient and mother that we do not want anything around the skin that will further prevent the skin barrier from adhering.  We discussed no Vaseline, no lotions, no baby wipes or anything but water.  We sized stoma at 1 1/4" today, mother cut skin barrier to 1 1/4".  This RN stretched 2" barrier ring to fit around stoma snugly.  We discussed this is extra skin protection.  Mother was able to remove backing from skin barrier and place on top of barrier ring. Mother was able to attach pouch to skin barrier and close pouch.  A high output pouch not needed at this time as stool has thickened and is no longer liquid.    The pouching process is difficult as ileostomy continuously draining stool and this makes it difficult to get a clean dry pouching surface. Also next to a wet midline incision.  Stressed to mother and patient that without a clean dry  surface the skin barrier will not stick.  Patient is critical of stoma location and current pouches.  I reassured him that he has a nice stoma and that this will be easier to pouch once his midline incision heals up and his stool thickens up (which is already happening).  Patient does have a history of Crohn's and states his stool was frequently liquid before surgery.    Reviewed educational booklet in room with mother. Also took mother a smaller booklet about living with an ileostomy.  She had questions about gas and odor.  We discussed that bags at home will be filtered and also brought her an Engineer, site with lubricating deodorizing drops marked.    Enrolled patient in [facility] Secure Start Discharge program: Yes, reached out again today with updated email, address and phone number   WOC team will continue to follow for ostomy support and education.   Thank you,    Priscella Mann MSN, RN-BC, [employer] [phone]

## 2022-09-11 LAB — BASIC METABOLIC PANEL
Anion gap: 5 (ref 5–15)
BUN: 23 mg/dL — ABNORMAL HIGH (ref 6–20)
CO2: 25 mmol/L (ref 22–32)
Calcium: 9.6 mg/dL (ref 8.9–10.3)
Chloride: 101 mmol/L (ref 98–111)
Creatinine, Ser: 0.62 mg/dL (ref 0.61–1.24)
GFR, Estimated: >60 mL/min (ref >60)
Glucose, Bld: 94 mg/dL (ref 70–99)
Potassium: 3.9 mmol/L (ref 3.5–5.1)
Sodium: 131 mmol/L — ABNORMAL LOW (ref 135–145)

## 2022-09-11 LAB — CBC WITH DIFFERENTIAL/PLATELET
Abs Immature Granulocytes: 0.04 10*3/uL (ref 0.00–0.07)
Basophils Absolute: 0.1 10*3/uL (ref 0.0–0.1)
Basophils Relative: 1 %
Eosinophils Absolute: 0.2 10*3/uL (ref 0.0–0.5)
Eosinophils Relative: 2 %
HCT: 28.6 % — ABNORMAL LOW (ref 39.0–52.0)
Hemoglobin: 9.3 g/dL — ABNORMAL LOW (ref 13.0–17.0)
Immature Granulocytes: 0 %
Lymphocytes Relative: 11 %
Lymphs Abs: 1.1 10*3/uL (ref 0.7–4.0)
MCH: 26.2 pg (ref 26.0–34.0)
MCHC: 32.5 g/dL (ref 30.0–36.0)
MCV: 80.6 fL (ref 80.0–100.0)
Monocytes Absolute: 0.8 10*3/uL (ref 0.1–1.0)
Monocytes Relative: 8 %
Neutro Abs: 7.5 10*3/uL (ref 1.7–7.7)
Neutrophils Relative %: 78 %
Platelets: 717 10*3/uL — ABNORMAL HIGH (ref 150–400)
RBC: 3.55 MIL/uL — ABNORMAL LOW (ref 4.22–5.81)
RDW: 16.2 % — ABNORMAL HIGH (ref 11.5–15.5)
WBC: 9.6 10*3/uL (ref 4.0–10.5)
nRBC: 0 % (ref 0.0–0.2)
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
## 2022-09-11 NOTE — Progress Notes (Signed)
Occupational Therapy Session Note  Patient Details  Name: Shane Klein MRN: [id number] Date of Birth: 1991-02-23  Today's Date: 09/11/2022 OT Individual Time: 1300-1345 OT Individual Time Calculation (min): 45 min    Short Term Goals: Week 1:  OT Short Term Goal 1 (Week 1): Pt will maintain sitting balance EOB/EOM with MOD A of 1 in prep for BADLs OT Short Term Goal 2 (Week 1): Pt will groom with MIN A to demo ipmroved bimanual coordination OT Short Term Goal 3 (Week 1): Pt will complete 1/4 steps of UB dressing  Skilled Therapeutic Interventions/Progress Updates:   Pt seen for skilled OT session with pt requesting to remain in bed versus encouraged oob. Pt agreeable to Saebo StimOne to R wrist extensors and tril to L dorsiflexors as per pt request. Stim in place on each 10 min with R UE > L UE visual results for AAROM. Educ on use of foot board control to allow intervals of heel cord stretch and pt reported seeing benefit. During stim pt able to complete Squigg placement with L hand and removal with R 20/20 completed Bly with 75 % accuracy on R hand. Compensatory approach for R hand. Left bed level with all needs and safety measures in place.   Saebo StimOne parameters:    no adverse skin reactions.  330 pulse width 35 Hz pulse rate On 8 sec/ off 8 sec Ramp up/ down 2 sec Symmetrical Biphasic wave form  Max intensity at 500 Ohm load  Pain:  Therapy Documentation Precautions:  Precautions Precautions: Fall, Other (comment) Precaution Comments: L JP drain, R colostomy/ileostomy, abdominal wound, R foot/ankle wound Required Braces or Orthoses: Other Brace Other Brace: B LE PRAFOs Restrictions Weight Bearing Restrictions: Yes RUE Weight Bearing: Weight bear through elbow only RLE Weight Bearing: Weight bearing as tolerated LLE Weight Bearing: Weight bearing as tolerated Other Position/Activity Restrictions: R UE no lifting > 5 lbs; no ROM restrictions in R  UE    Therapy/Group: Individual Therapy  Vicenta Dunning 09/11/2022, 7:55 AM

## 2022-09-12 MED ORDER — DULOXETINE HCL 60 MG PO CPEP
90.0000 mg | ORAL_CAPSULE | Freq: Every day | ORAL | Status: DC
  Administered 2022-09-12 – 2022-09-15 (×4): 90 mg via ORAL
  Filled 2022-09-12 (×4): qty 1

## 2022-09-12 MED ORDER — DICLOFENAC SODIUM 1 % EX GEL
2.0000 g | Freq: Four times a day (QID) | CUTANEOUS | Status: DC
  Administered 2022-09-12 – 2022-10-05 (×76): 2 g via TOPICAL
  Filled 2022-09-12 (×2): qty 100

## 2022-09-12 NOTE — Progress Notes (Addendum)
Physical Therapy Weekly Progress Note  Patient Details  Name: Shane Klein MRN: [id number] Date of Birth: [birth year]  Beginning of progress report period: Sep 04, 2022 End of progress report period: Sep 12, 2022  Today's Date: 09/12/2022 PT Individual Time: 1345-1420, 1435-1536  PT Individual Time Calculation (min): 35 min , 61 min   Patient has met 5 of 5 short term goals.  Patient is making steady progress to long term goals. Pt largely requires mod A for bed mobility with use of bed features, max A x 1-2 with STS transfers and max A x 1 with squat pivot transfers. Plan to continue LE strength building, NMR, and pre-gait activities to prepare for gait training. Pt received speciality wheelchair evaluation and plan for follow in the next week to finalize wheelchair features. Plan to continue patient and family education regarding SCI diagnosis and mobility.   Patient continues to demonstrate the following deficits muscle paralysis and spasticity, abnormal tone and decreased coordination, and decreased sitting balance, decreased standing balance, decreased postural control, and decreased balance strategies and therefore will continue to benefit from skilled PT intervention to increase functional independence with mobility.  Patient progressing toward long term goals..  Continue plan of care.  PT Short Term Goals Week 1:  PT Short Term Goal 1 (Week 1): Pt will perform rolling R/L with min assist PT Short Term Goal 1 - Progress (Week 1): Met PT Short Term Goal 2 (Week 1): Pt will perform supine<>sit with max assist of 1 PT Short Term Goal 2 - Progress (Week 1): Met PT Short Term Goal 3 (Week 1): Pt will maintain static sitting balance EOM for at least 2 minutes with no more than supervision PT Short Term Goal 3 - Progress (Week 1): Met PT Short Term Goal 4 (Week 1): Pt will progress to standing tolerance in tilt table or harness support for at least 5 minutes PT Short Term Goal 4 -  Progress (Week 1): Met PT Short Term Goal 5 (Week 1): Pt will transfer bed<>chair with +2 mod assist using LRAD PT Short Term Goal 5 - Progress (Week 1): Met Week 2:  PT Short Term Goal 1 (Week 2): Pt will require mod A x 1 with w/c<>mat transfer PT Short Term Goal 2 (Week 2): Pt will require mod A with STS PT Short Term Goal 3 (Week 2): Pt will require (S) for w/c part management PT Short Term Goal 4 (Week 2): Pt will require mod A with supine<>sit  Skilled Therapeutic Interventions/Progress Updates:      Therapy Documentation Precautions:  Precautions Precautions: Fall, Other (comment) Precaution Comments: L JP drain, R colostomy/ileostomy, abdominal wound, R foot/ankle wound Required Braces or Orthoses: Other Brace Other Brace: B LE PRAFOs Restrictions Weight Bearing Restrictions: Yes RUE Weight Bearing: Weight bearing as tolerated RLE Weight Bearing: Weight bearing as tolerated LLE Weight Bearing: Weight bearing as tolerated Other Position/Activity Restrictions: R UE no lifting > 5 lbs; no ROM restrictions in R UE  Treatment Session 1:   Pt received seated in w/c at bedside and agreeable to PT session. Pt transported by w/c dependently to main gym for energy conservation. Pt attempted STS in parallel bars with R elbow weight bearing through PT's forearm and pt reported incisional pain. Nurse notified and assessed incision and cleared for continuation of therapy. Pt performed 2 additional stands with similar technique with max A and nurse administered pain medication. Nurse observed ostomy leakage and pt transported to room and left in care  of nurse for ostomy management.   Treatment Session 2:   Pt agreeable to second PT session after nurse completed ostomy management. Pt performed 3 stands with PT anterior to patient for bilateral knee block with arms wrapped around therapist as nurse performed lower body dressing. Pt transported by w/c dependently to dayroom for time management.  Pt participated in block practice of STS transfers and pre-gait activities with use of mirror for visual feedback. Utilized three musketeer technique for total of 5 STS transfers. Pt able to progress to lateral weight shifting and minimal heel elevation. Pt encouraged by progress with PT and able to tolerate standing ~60 seconds each bout. Pt requires +2 for uphill squat pivot to bed from TIS. Pt left semi-reclined in bed with all needs in reach.   Therapy/Group: Individual Therapy  Truitt Leep Truitt Leep PT, DPT  09/12/2022, 7:53 AM

## 2022-09-12 NOTE — Progress Notes (Signed)
Occupational Therapy Session Note  Patient Details  Name: Shane Klein MRN: [id number] Date of Birth: Apr 18, 1991  {CHL IP REHAB OT TIME CALCULATIONS:[id number]}   Short Term Goals: Week 2:  OT Short Term Goal 1 (Week 2): Pt will perform UB ADLs with Setup A. OT Short Term Goal 2 (Week 2): Pt will perform LB ADLs with Mod A + LRAD. OT Short Term Goal 3 (Week 2): Pt will performs squat pivot transfers with consistent Mod A.  Skilled Therapeutic Interventions/Progress Updates:  Pt received *** for skilled OT session with focus on ***. Pt agreeable to interventions, demonstrating overall *** mood. Pt reported ***/10 pain, stating "***" in reference to ***. OT offering intermediate rest breaks and positioning suggestions throughout session to address pain/fatigue and maximize participation/safety in session.    Pt remained *** with all immediate needs met at end of session. Pt continues to be appropriate for skilled OT intervention to promote further functional independence. ;  Therapy Documentation Precautions:  Precautions Precautions: Fall, Other (comment) Precaution Comments: L JP drain, R colostomy/ileostomy, abdominal wound, R foot/ankle wound Required Braces or Orthoses: Other Brace Other Brace: B LE PRAFOs Restrictions Weight Bearing Restrictions: Yes RUE Weight Bearing: Weight bearing as tolerated RLE Weight Bearing: Weight bearing as tolerated LLE Weight Bearing: Weight bearing as tolerated Other Position/Activity Restrictions: R UE no lifting > 5 lbs; no ROM restrictions in R UE   Therapy/Group: Individual Therapy  Lou Cal, OTR/L, MSOT  09/12/2022, 11:09 PM
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
## 2022-09-12 NOTE — Progress Notes (Signed)
Occupational Therapy Weekly Progress Note  Patient Details  Name: Shane Klein MRN: [id number] Date of Birth: 05-01-1990  Beginning of progress report period: Sep 04, 2022 End of progress report period: Sep 12, 2022  Today's Date: 09/12/2022 OT Individual Time: 4098-1191 OT Individual Time Calculation (min): 79 min    Patient has met 3 of 3 short term goals. Pt has made strong functional progress this reporting period. Pt currently requires Min A for UB ADLs and Mod A for LB ADLs. Pt has recently progressed to OOB ADL participation, performing squat pivot transfers with Mod-Max A. Pt's mother present during the weekdays, official caregiver education to be scheduled closer to discharge date or as appropriate.   Patient continues to demonstrate the following deficits: muscle weakness, decreased cardiorespiratoy endurance, unbalanced muscle activation and decreased coordination, increased sensitivity in BLEs, and decreased standing balance, decreased postural control, and decreased balance strategies and therefore will continue to benefit from skilled OT intervention to enhance overall performance with BADL and Reduce care partner burden.  Patient progressing toward long term goals..  Continue plan of care.  OT Short Term Goals Week 1:  OT Short Term Goal 1 (Week 1): Pt will maintain sitting balance EOB/EOM with MOD A of 1 in prep for BADLs OT Short Term Goal 1 - Progress (Week 1): Met OT Short Term Goal 2 (Week 1): Pt will groom with MIN A to demo ipmroved bimanual coordination OT Short Term Goal 2 - Progress (Week 1): Met OT Short Term Goal 3 (Week 1): Pt will complete 1/4 steps of UB dressing OT Short Term Goal 3 - Progress (Week 1): Met Week 2:  OT Short Term Goal 1 (Week 2): Pt will perform UB ADLs with Setup A. OT Short Term Goal 2 (Week 2): Pt will perform LB ADLs with Mod A + LRAD. OT Short Term Goal 3 (Week 2): Pt will performs squat pivot transfers with consistent Mod  A.  Skilled Therapeutic Interventions/Progress Updates:  Pt received resting in bed receiving ostomy care.  OT session focused on gentle BLE stretching, functional transfers, LB dressing, and general conditioning. Pt agreeable to interventions, demonstrating overall pleasant mood. Pt with un-rated pain, stating "Sometime I just want to give up, but I've come too far" in reference to frustration with ostomy care. OT offering intermediate rest breaks and positioning suggestions throughout session to address pain/fatigue and maximize participation/safety in session.  While nursing care provided, pt dependently donned bilateral TEDs/socks. Pt receptive to BLE stretches while ostomy bag changed, including calves, hips, and hamstrings. Pt tolerates x2 reps of 10-15 sec holds with increased sensitivity noted in LLE.  Pt transitioned to TIS WC with Max A squat-pivot, able to recall propel body positioning. Sitting in TIS WC, pt performs lateral leans and "WC pushups" to doff/don clean shorts. Pt demonstrating slight WB through BLEs with L-knee blocked. Pt with one instance of anterior LOB, recovered with Min A.   Pt dependently transported to day room, performing 3 reps/5 sec holds of "WC pushups" with support provided at RUE.   Pt remained sitting in TIS WC with all immediate needs met at end of session. Pt continues to be appropriate for skilled OT intervention to promote further functional independence.   Therapy Documentation Precautions:  Precautions Precautions: Fall, Other (comment) Precaution Comments: L JP drain, R colostomy/ileostomy, abdominal wound, R foot/ankle wound Required Braces or Orthoses: Other Brace Other Brace: B LE PRAFOs Restrictions Weight Bearing Restrictions: Yes RUE Weight Bearing: Weight bearing as tolerated  RLE Weight Bearing: Weight bearing as tolerated LLE Weight Bearing: Weight bearing as tolerated Other Position/Activity Restrictions: R UE no lifting > 5 lbs; no ROM  restrictions in R UE   Therapy/Group: Individual Therapy  Lou Cal, OTR/L, MSOT  09/12/2022, 5:29 AM

## 2022-09-12 NOTE — Progress Notes (Addendum)
+/-   sleep. Ostomy leaked around 0400. Small amount of bleeding noted at 10 o'clock area. Stoma powder and no sting barrier applied, along with skin barrier ring. Dressing around JP drain site changed, suture in place and scant amount of serosanguinous drainage noted. Recharged every 4 hours, emptied this AM for 5cc's, green drainage with foul odor.  Dressing to abdominal incision changed, retention sutures in place, small amount of bleeding observed. Incontinent of urine x 1, Patient unaware. Foam dressing to sacrum changed. Intermittently wore bilateral PRAFO boots, refusing at times. Bilateral heels elevated. Turned to left side and back. Doesn't like to lay on right side because of ostomy bag. No PRN meds given. Alfredo Martinez A

## 2022-09-13 ENCOUNTER — Inpatient Hospital Stay (HOSPITAL_COMMUNITY): Payer: Medicaid Other

## 2022-09-13 DIAGNOSIS — R7989 Other specified abnormal findings of blood chemistry: Secondary | ICD-10-CM

## 2022-09-13 DIAGNOSIS — E861 Hypovolemia: Secondary | ICD-10-CM

## 2022-09-13 DIAGNOSIS — D62 Acute posthemorrhagic anemia: Secondary | ICD-10-CM

## 2022-09-13 LAB — CBC WITH DIFFERENTIAL/PLATELET
Abs Immature Granulocytes: 0.03 10*3/uL (ref 0.00–0.07)
Basophils Absolute: 0.1 10*3/uL (ref 0.0–0.1)
Basophils Relative: 1 %
Eosinophils Absolute: 0.3 10*3/uL (ref 0.0–0.5)
Eosinophils Relative: 4 %
HCT: 25.4 % — ABNORMAL LOW (ref 39.0–52.0)
Hemoglobin: 8.2 g/dL — ABNORMAL LOW (ref 13.0–17.0)
Immature Granulocytes: 0 %
Lymphocytes Relative: 15 %
Lymphs Abs: 1.2 10*3/uL (ref 0.7–4.0)
MCH: 26.5 pg (ref 26.0–34.0)
MCHC: 32.3 g/dL (ref 30.0–36.0)
MCV: 82.2 fL (ref 80.0–100.0)
Monocytes Absolute: 0.8 10*3/uL (ref 0.1–1.0)
Monocytes Relative: 10 %
Neutro Abs: 5.6 10*3/uL (ref 1.7–7.7)
Neutrophils Relative %: 70 %
Platelets: 607 10*3/uL — ABNORMAL HIGH (ref 150–400)
RBC: 3.09 MIL/uL — ABNORMAL LOW (ref 4.22–5.81)
RDW: 16.3 % — ABNORMAL HIGH (ref 11.5–15.5)
WBC: 8.1 10*3/uL (ref 4.0–10.5)
nRBC: 0 % (ref 0.0–0.2)

## 2022-09-13 LAB — COMPREHENSIVE METABOLIC PANEL
ALT: 183 U/L — ABNORMAL HIGH (ref 0–44)
AST: 168 U/L — ABNORMAL HIGH (ref 15–41)
Albumin: 1.9 g/dL — ABNORMAL LOW (ref 3.5–5.0)
Alkaline Phosphatase: 95 U/L (ref 38–126)
Anion gap: 6 (ref 5–15)
BUN: 18 mg/dL (ref 6–20)
CO2: 22 mmol/L (ref 22–32)
Calcium: 9.2 mg/dL (ref 8.9–10.3)
Chloride: 105 mmol/L (ref 98–111)
Creatinine, Ser: 0.7 mg/dL (ref 0.61–1.24)
GFR, Estimated: >60 mL/min (ref >60)
Glucose, Bld: 96 mg/dL (ref 70–99)
Potassium: 3.7 mmol/L (ref 3.5–5.1)
Sodium: 133 mmol/L — ABNORMAL LOW (ref 135–145)
Total Bilirubin: 0.3 mg/dL (ref 0.3–1.2)
Total Protein: 6.4 g/dL — ABNORMAL LOW (ref 6.5–8.1)

## 2022-09-13 NOTE — Progress Notes (Signed)
Physical Therapy Session Note  Patient Details  Name: Shane Klein MRN: [id number] Date of Birth: November 10, 1990  Today's Date: 09/13/2022 PT Individual Time: 1015-1045 PT Individual Time Calculation (min): 30 min   Short Term Goals: Week 2:  PT Short Term Goal 1 (Week 2): Pt will require mod A x 1 with w/c<>mat transfer PT Short Term Goal 2 (Week 2): Pt will require mod A with STS PT Short Term Goal 3 (Week 2): Pt will require (S) for w/c part management PT Short Term Goal 4 (Week 2): Pt will require mod A with supine<>sit  Skilled Therapeutic Interventions/Progress Updates:  Patient greeted supine in bed with mother present and agreeable to PT treatment session. Patient reporting increased B foot pain which was made worse when WB. Patient agreeable to supine therex this morning, however stated he would go to the rehab gym this afternoon with other PT and work on [employer] activities.   Patient performed supine therex in order to increased B LE ROM and strength, as well as decrease pain for improved functional mobility-  -Lt knee to chest/heel slide with AAROM, x10  -Rt knee to chest/heel slide with AAROM, x10  -Lt SAQ with AAROM, x10  -Rt SLR, x10  -Rt ankle pumps, x20 -Lt ankle pumps PROM, x20  -B glute squeezes, x15   Patient left supine in bed with PRAFO boots donned, B LE propped on pillows, Rt UE propped on pillow, call bell within reach and all needs met.    Therapy Documentation Precautions:  Precautions Precautions: Fall, Other (comment) Precaution Comments: L JP drain, R colostomy/ileostomy, abdominal wound, R foot/ankle wound Required Braces or Orthoses: Other Brace Other Brace: B LE PRAFOs Restrictions Weight Bearing Restrictions: Yes RUE Weight Bearing: Weight bearing as tolerated RLE Weight Bearing: Weight bearing as tolerated LLE Weight Bearing: Weight bearing as tolerated Other Position/Activity Restrictions: R UE no lifting > 5 lbs; no ROM restrictions in R  UE  Pain: Reported increased B foot pain, especially with WB- Therapist provided education regarding return of sensation, as well as performing increased WB activities with therapy leading to increased pain. Patient reported understanding and already received all available medications.    Therapy/Group: Individual Therapy  Terrance Usery 09/13/2022, 7:59 AM

## 2022-09-13 NOTE — Progress Notes (Signed)
Orthopedic Tech Progress Note Patient Details:  [name] 31-Aug-1990 [id number]  Called in order to [employer] for a [employer]   Patient ID: Shane Klein, male   DOB: 12/03/1990, 32 y.o.   MRN: [id number]  Donald Pore 09/13/2022, 12:06 PM

## 2022-09-13 NOTE — Progress Notes (Addendum)
Physical Therapy Session Note  Patient Details  Name: Shane Klein MRN: [id number] Date of Birth: 11-19-1990  Today's Date: 09/13/2022 PT Individual Time: 4098-1191 PT Individual Time Calculation (min): 39 min  and Today's Date: 09/13/2022 PT Missed Time: 36 Minutes Missed Time Reason: Xray;Other (Comment) (pt off unit for imaging; eating lunch on return from imaging)  Short Term Goals: Week 2:  PT Short Term Goal 1 (Week 2): Pt will require mod A x 1 with w/c<>mat transfer PT Short Term Goal 2 (Week 2): Pt will require mod A with STS PT Short Term Goal 3 (Week 2): Pt will require (S) for w/c part management PT Short Term Goal 4 (Week 2): Pt will require mod A with supine<>sit   Skilled Therapeutic Interventions/Progress Updates:  Initially, pt not in room and mother relates that pt is still out for imaging. Does not know when he will return. Informed mother that PT will be back to check. On return, pt has returned however, needs to eat lunch and requiring a little more time in order to eat. After another 15 min, pt completed lunch, is supine in bed. Patient alert and agreeable to PT session.   Patient with no overt pain complaint at start of session.  Therapeutic Activity: Bed Mobility: Pt performed supine --> sit with close supervision/ CGA requiring extra time to complete. RLE assisting LLE off EOB. No vc required for technique as pt adamant to demonstrate ability. At end of session, pt returns to supine with ModA for LLE to bed surface. Transfers: Pt performed squat pivot transfers bed<>TIS w/c with overall ModA. Provided verbal cues for forward lean, self chooses to put RUE around therapist's shoulders for stability. On return to bed at end of session, RUE around therapist's shoulders and L palm on bed. ModA to return.   Neuromuscular Re-ed: Prior to standing, pt educated on pain science and unfortunate return of activity in pain fibers prior to other sensations. Educated that  brain makes detemination of pain and with practice, pt can adjust thinking from current pain sensation as harmful toward understanding that nerves are just "waking up" and there is actually no harmful pain but just healing pain that is occurring. Pt demos understanding.   NMR facilitated during session with focus on standing balance and tolerance. Pt guided in maintaining upright posturing during stance. With technique, he is able to perform sit<>stand with MinA +2. Once standing, guided in more erect posture with performance of scap set, glute set, and posterior pelvic tilt. Mirror used for visual feedback on posture and pt very pleased with improved appearance and height. With support, pt is able to weight shift and lift RLE ~8in from floor. With effort and focus, he is able to lift R foot from floor ~1-2in. Significant foot drop. Maintains stance for .   After therapeutic rest break, pt guided in taking 4 steps forward, then backward back to w/c with overall Mod/ maxA +2. He is able to advance RLE on own as well as initiate lift of LLE then requiring MaxA for advancement. Cued to maintain knee extension during stance phase bilaterally and no demo of buckle throughout. Is also able to initiate RLE progression in backwards ambulation but requires assist for full step progression as well as MaxA for LLE. Following ambulation, pt relates it felt good and is much happier overall. Is quick to relate to mother that he walked.   NMR performed for improvements in motor control and coordination, balance, sequencing, judgement, and  self confidence/ efficacy in performing all aspects of mobility at highest level of independence.   Patient supine in bed at end of session with brakes locked, mother present to provide supervision, and all needs within reach. Bil PRAFOs donned with MaxA.    Therapy Documentation Precautions:  Precautions Precautions: Fall, Other (comment) Precaution Comments: L JP  drain, R colostomy/ileostomy, abdominal wound, R foot/ankle wound Required Braces or Orthoses: Other Brace Other Brace: B LE PRAFOs Restrictions Weight Bearing Restrictions: Yes RUE Weight Bearing: Weight bearing as tolerated RLE Weight Bearing: Weight bearing as tolerated LLE Weight Bearing: Weight bearing as tolerated Other Position/Activity Restrictions: R UE no lifting > 5 lbs; no ROM restrictions in R UE General:   Vital Signs:   Pain: Pain Assessment Pain Scale: 0-10 Pain Score: 0-No pain  Therapy/Group: Individual Therapy  Loel Dubonnet PT, DPT, CSRS 09/13/2022, 12:51 PM

## 2022-09-13 NOTE — Progress Notes (Addendum)
Patient off the unit at this for ordered X-ray by MD. Melene Muller chart checked to ensure correct patient's information is in the chart.  Shadow chart given to transporter along with A ticket to ride.    Tilden Dome, LPN

## 2022-09-13 NOTE — Progress Notes (Signed)
Notified Deatra Ina, PA of x-ray results.    Tilden Dome, LPN

## 2022-09-13 NOTE — Progress Notes (Signed)
Patient returned to the unit with shadow chart.       Tilden Dome, LPN
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
## 2022-09-13 NOTE — Progress Notes (Signed)
Mother provided return demonstration with emptying ileostomy bag.     Tilden Dome, LPN

## 2022-09-13 NOTE — Progress Notes (Signed)
Occupational Therapy Session Note  Patient Details  Name: Shane Klein MRN: [id number] Date of Birth: 1990-10-24  Today's Date: 09/13/2022 OT Individual Time: 1120-1200 OT Individual Time Calculation (min): 40 min    Short Term Goals: Week 2:  OT Short Term Goal 1 (Week 2): Pt will perform UB ADLs with Setup A. OT Short Term Goal 2 (Week 2): Pt will perform LB ADLs with Mod A + LRAD. OT Short Term Goal 3 (Week 2): Pt will performs squat pivot transfers with consistent Mod A.  Skilled Therapeutic Interventions/Progress Updates:  Skilled OT intervention completed with focus on R hand positioning education, and fine motor control. Pt received upright in bed, verbalizing discomfort in pelvic region. Pre-medicated, with nurse indicating plan for CT asap, ordered by MD. OT offered rest breaks and repositioning throughout for pain reduction.   Pt requested to stay in bed 2/2 anytime CT as well as discomfort. Pt agreeable to bed level activities.  Upon assessment of available ROM in R hand/wrist, pt indicated that he's had nightly discomfort with involuntary contractions of R digits with inability to open his hand "like a cramp." Reviewed current orders with absent order for resting hand splint, therefore OT requested new order for R splint at night for wrist/digits positioning and increased comfort. Education provided to pt on purpose and wear schedule.  Education provided on wash cloth use for AROM that he can do independently, including gross grasping, pinching and flipping the wash cloth over for forearm supination/pronation.  At table top, pt completed 9 hole peg test to assess fine motor abilities with the following results: L hand- 27.99 sec R hand- 4:30 sec (discontinued assessment for time for focus on activity 2/2 longer than normal result)  Transitioned to peg activity using tweezers as pt with demo of poor sustained fine motor grasp 2/2 poor sensation/control. With verbal cues, pt  was able to transfer several from wash cloth to container successfully.  Pt remained semi upright in bed, with bed alarm on/activated, and with all needs in reach at end of session.   Therapy Documentation Precautions:  Precautions Precautions: Fall, Other (comment) Precaution Comments: L JP drain, R colostomy/ileostomy, abdominal wound, R foot/ankle wound Required Braces or Orthoses: Other Brace Other Brace: B LE PRAFOs Restrictions Weight Bearing Restrictions: Yes RUE Weight Bearing: Weight bearing as tolerated RLE Weight Bearing: Weight bearing as tolerated LLE Weight Bearing: Weight bearing as tolerated Other Position/Activity Restrictions: R UE no lifting > 5 lbs; no ROM restrictions in R UE   Therapy/Group: Individual Therapy  Melvyn Novas, MS, OTR/L  09/13/2022, 12:21 PM

## 2022-09-13 NOTE — Progress Notes (Signed)
+/-   sleep.  Ostomy bag emptied 6 times so far this shift. Required Complete change x 1, because of leaking.  Stool type 6 or 7. JP drain with 5ml of green discharge with foul odor. Wore PRAFO boots longer tonight, still requests them off at times. No PRN meds given. Agrees to turn to left side or back. Alfredo Martinez A

## 2022-09-14 LAB — CBC
HCT: 24.3 % — ABNORMAL LOW (ref 39.0–52.0)
Hemoglobin: 7.9 g/dL — ABNORMAL LOW (ref 13.0–17.0)
MCH: 26.2 pg (ref 26.0–34.0)
MCHC: 32.5 g/dL (ref 30.0–36.0)
MCV: 80.7 fL (ref 80.0–100.0)
Platelets: 520 10*3/uL — ABNORMAL HIGH (ref 150–400)
RBC: 3.01 MIL/uL — ABNORMAL LOW (ref 4.22–5.81)
RDW: 16.2 % — ABNORMAL HIGH (ref 11.5–15.5)
WBC: 7.2 10*3/uL (ref 4.0–10.5)
nRBC: 0 % (ref 0.0–0.2)

## 2022-09-14 MED ORDER — CALCIUM POLYCARBOPHIL 625 MG PO TABS
1250.0000 mg | ORAL_TABLET | Freq: Three times a day (TID) | ORAL | Status: DC
  Administered 2022-09-14 – 2022-10-05 (×64): 1250 mg via ORAL
  Filled 2022-09-14 (×64): qty 2

## 2022-09-14 NOTE — Plan of Care (Signed)
  Problem: Consults Goal: RH GENERAL PATIENT EDUCATION Description: See Patient Education module for education specifics. 09/14/2022 1710 by Elgie Congo, LPN Outcome: Progressing 09/14/2022 1710 by Elgie Congo, LPN Outcome: Progressing   Problem: RH BOWEL ELIMINATION Goal: RH STG MANAGE BOWEL WITH ASSISTANCE Description: STG Manage Bowel with mod I Assistance. 09/14/2022 1710 by Elgie Congo, LPN Outcome: Progressing 09/14/2022 1710 by Elgie Congo, LPN Outcome: Progressing Goal: RH STG MANAGE BOWEL W/MEDICATION W/ASSISTANCE Description: STG Manage Bowel with Medication with mod I Assistance. 09/14/2022 1710 by Elgie Congo, LPN Outcome: Progressing 09/14/2022 1710 by Tilden Dome A, LPN Outcome: Progressing   Problem: RH BLADDER ELIMINATION Goal: RH STG MANAGE BLADDER WITH ASSISTANCE Description: STG Manage Bladder With mod I Assistance 09/14/2022 1710 by Tilden Dome A, LPN Outcome: Progressing 09/14/2022 1710 by Elgie Congo, LPN Outcome: Progressing Goal: RH STG MANAGE BLADDER WITH MEDICATION WITH ASSISTANCE Description: STG Manage Bladder With Medication With mod I Assistance. 09/14/2022 1710 by Elgie Congo, LPN Outcome: Progressing 09/14/2022 1710 by Tilden Dome A, LPN Outcome: Progressing   Problem: RH SKIN INTEGRITY Goal: RH STG SKIN FREE OF INFECTION/BREAKDOWN Description: Manage independently 09/14/2022 1710 by Tilden Dome A, LPN Outcome: Progressing 09/14/2022 1710 by Elgie Congo, LPN Outcome: Progressing Goal: RH STG MAINTAIN SKIN INTEGRITY WITH ASSISTANCE Description: STG Maintain Skin Integrity With min Assistance. 09/14/2022 1710 by Elgie Congo, LPN Outcome: Progressing 09/14/2022 1710 by Elgie Congo, LPN Outcome: Progressing Goal: RH STG ABLE TO PERFORM INCISION/WOUND CARE W/ASSISTANCE Description: STG Able To Perform Incision/Wound Care With min Assistance. 09/14/2022 1710 by Elgie Congo, LPN Outcome:  Progressing 09/14/2022 1710 by Elgie Congo, LPN Outcome: Progressing   Problem: RH SAFETY Goal: RH STG ADHERE TO SAFETY PRECAUTIONS W/ASSISTANCE/DEVICE Description: STG Adhere to Safety Precautions With cues Assistance/Device. 09/14/2022 1710 by Elgie Congo, LPN Outcome: Progressing 09/14/2022 1710 by Tilden Dome A, LPN Outcome: Progressing   Problem: RH PAIN MANAGEMENT Goal: RH STG PAIN MANAGED AT OR BELOW PT'S PAIN GOAL Description: < 4 with prns 09/14/2022 1710 by Tilden Dome A, LPN Outcome: Progressing 09/14/2022 1710 by Elgie Congo, LPN Outcome: Progressing   Problem: RH KNOWLEDGE DEFICIT GENERAL Goal: RH STG INCREASE KNOWLEDGE OF SELF CARE AFTER HOSPITALIZATION Description: Patient and family will be able to manage care at discharge using educational resources independently 09/14/2022 1710 by Elgie Congo, LPN Outcome: Progressing 09/14/2022 1710 by Elgie Congo, LPN Outcome: Progressing

## 2022-09-14 NOTE — Progress Notes (Signed)
Physical Therapy Session Note  Patient Details  Name: Shane Klein MRN: [id number] Date of Birth: 03-May-1990  Today's Date: 09/14/2022 PT Individual Time: 0865-7846 PT Individual Time Calculation (min): 14 min  and Today's Date: 09/14/2022 PT Missed Time: 31 Minutes Missed Time Reason: Patient unwilling to participate  Short Term Goals: Week 2:  PT Short Term Goal 1 (Week 2): Pt will require mod A x 1 with w/c<>mat transfer PT Short Term Goal 2 (Week 2): Pt will require mod A with STS PT Short Term Goal 3 (Week 2): Pt will require (S) for w/c part management PT Short Term Goal 4 (Week 2): Pt will require mod A with supine<>sit   Skilled Therapeutic Interventions/Progress Updates:  Pt supine in bed on entrance to room. Is lethargic and relates hunger and unwillingness to participate in physical session at this time. Educated pt on increasing mobility in order to improve overall feeling of lethargy and fatigue as well as pain. Pt appreciative of info but continues to ask if he can wait until after he eats. Discussion with scheduler to attempt to coordinate OT sessions for mornings and PT sessions for afternoons if possible.   Pt did not wear new WHO last night. Pt introduced to hand splint and need to wear overnight and have off during day so that pt can work on AROM. Demonstrated how to don and pt relates comfort in splint but also demos decreased thumb palmar abduction range requiring slow progression in splint. Pt relates comfort in splint and wants to wear during lunch. PT will remove prior to next session in which pt relates he will participate.   Pt supine in bed at end of session with all needs in reach.   Therapy Documentation Precautions:  Precautions Precautions: Fall, Other (comment) Precaution Comments: L JP drain, R colostomy/ileostomy, abdominal wound, R foot/ankle wound Required Braces or Orthoses: Other Brace Other Brace: B LE PRAFOs Restrictions Weight Bearing  Restrictions: Yes RUE Weight Bearing: Weight bearing as tolerated (through elbow only) RLE Weight Bearing: Weight bearing as tolerated LLE Weight Bearing: Weight bearing as tolerated Other Position/Activity Restrictions: RUE WBAT thru elbow only, no lifting > 5 lbs; no ROM restrictions General:   Vital Signs: Therapy Vitals Temp: 97.9 F (36.6 C) Temp Source: Oral Pulse Rate: 79 Resp: 18 BP: 100/61 Patient Position (if appropriate): Lying Oxygen Therapy SpO2: 100 % O2 Device: Room Air Pain:  No pain related from pt this session.   Therapy/Group: Individual Therapy  Loel Dubonnet PT, DPT, CSRS 09/14/2022, 7:19 AM

## 2022-09-14 NOTE — Progress Notes (Signed)
Orthopedic Tech Progress Note Patient Details:  CHAMP GIALANELLA 26-Dec-1990 [id number]  Called in order to [facility] for BLE AFO's CONSULT  Patient ID: ARICK MAPLES, male   DOB: 06-19-1990, 32 y.o.   MRN: [id number]  Donald Pore 09/14/2022, 12:06 PM

## 2022-09-14 NOTE — Progress Notes (Signed)
Patient ID: Shane Klein, male   DOB: 1990-07-06, 32 y.o.   MRN: [id number]  Met with pt to give team conference update and progress toward his goals of min assist level. He can see his progress and is pleased with it. His Mom is beginning to learn his ileostomy with WOC educator and will continue this. Target discharge date of 6/11. He is glad he will be home before his birthday later in [birth year]. Wheelchair evaluation on Thursday and aware still looking for home health agency if can accept the referral. Will continue to work on discharge needs.

## 2022-09-14 NOTE — Progress Notes (Signed)
Occupational Therapy Session Note  Patient Details  Name: Shane Klein MRN: [id number] Date of Birth: [birth year]  Today's Date: 09/14/2022 OT Individual Time: 4098-1191 OT Individual Time Calculation (min): 50 min  and Today's Date: 09/14/2022 OT Missed Time: 10 Minutes Missed Time Reason: Patient fatigue   Short Term Goals: Week 2:  OT Short Term Goal 1 (Week 2): Pt will perform UB ADLs with Setup A. OT Short Term Goal 2 (Week 2): Pt will perform LB ADLs with Mod A + LRAD. OT Short Term Goal 3 (Week 2): Pt will performs squat pivot transfers with consistent Mod A.  Skilled Therapeutic Interventions/Progress Updates:  Pt received seated in [facility] for skilled OT session with focus on functional transfers and WC modifications. Pt agreeable to interventions, despite overall lethargy and increased BLE sensitivity. Pt with un-rated pain, medicated during session. OT offering intermediate rest breaks and positioning suggestions throughout session to address pain/fatigue and maximize participation/safety in session.   Pt dependently transported main therapy gym for time management. In main therapy gym, pt performs squat pivot transfer from WC<>EOM with Mod A x2 due to increased fatigue. Pt assisted into supine on mat for pain management. While WC was modified, OT provides gentle PROM of R-hand PIPs/DIPs. WC modifications noted below:  -Front casters adjusted to drop the footplates down an inch. This was completed to allow for BLEs to be more neutrally positioned.  - 18X18 J2 cushion switched for a 18x20 Axiom Skin protectant and positioning cushion. This style will help prevent abduction at the hips/knees and allow for an extra 2 inches of thigh support.   Pt requesting to return to bed for fatigue/pain management, WC>EOB with Mod A x2.   Pt remained resting in bed with all immediate needs met at end of session. Pt continues to be appropriate for skilled OT intervention to promote further  functional independence.   Therapy Documentation Precautions:  Precautions Precautions: Fall, Other (comment) Precaution Comments: L JP drain, R colostomy/ileostomy, abdominal wound, R foot/ankle wound Required Braces or Orthoses: Other Brace Other Brace: B LE PRAFOs Restrictions Weight Bearing Restrictions: Yes RUE Weight Bearing: Weight bearing as tolerated RLE Weight Bearing: Weight bearing as tolerated LLE Weight Bearing: Weight bearing as tolerated Other Position/Activity Restrictions: R UE no lifting > 5 lbs; no ROM restrictions in R UE   Therapy/Group: Individual Therapy  Lou Cal, OTR/L, MSOT  09/14/2022, 6:17 AM

## 2022-09-14 NOTE — Progress Notes (Signed)
Nutrition Follow-up  DOCUMENTATION CODES:   Non-severe (moderate) malnutrition in context of chronic illness  INTERVENTION:  - Continue 1 packet Juven BID, each packet provides 95 calories, 2.5 grams of protein (collagen), and 9.8 grams of carbohydrate (3 grams sugar); also contains 7 grams of L-arginine and L-glutamine, 300 mg vitamin C, 15 mg vitamin E, 1.2 mcg vitamin B-12, 9.5 mg zinc, 200 mg calcium, and 1.5 g  Calcium Beta-hydroxy-Beta-methylbutyrate to support wound healing  NUTRITION DIAGNOSIS:   Moderate Malnutrition related to chronic illness as evidenced by moderate muscle depletion, moderate fat depletion.  GOAL:   Patient will meet greater than or equal to 90% of their needs  MONITOR:   PO intake, Supplement acceptance, Skin  REASON FOR ASSESSMENT:   Malnutrition Screening Tool    ASSESSMENT:   32 y.o. male admits to CIR related to functional deficits secondary to spinal cord injury due to fragments in the spinal canal at T12 and L2. PMH includes: acute radial nerve palsy, right supracondylar humerus fracture.  Meds reviewed:  Vit C, Vit D3, Vit B12, Lomotil, ferrous sulfate, imodium, mag-ox, remeron, MVI, fibercon. Labs reviewed: Na low, K low.   Pt ate 75% of his breakfast this am. Per record, pt has eaten 50-100% of his meals over the past 7 days. Ensure shakes were discontinued. Pt has been taking Juven supplements. RD will continue to monitor PO intakes.   Diet Order:   Diet Order             Diet regular Room service appropriate? Yes; Fluid consistency: Thin  Diet effective now                   EDUCATION NEEDS:   Not appropriate for education at this time  Skin:  Skin Integrity Issues:: Stage II, Stage III DTI: R arm Stage II: R ankle; left tibial; Stage III: Mid buttocks Incisions: back; abdomen; left hip Other: R wrist  Last BM:  5/20 - type 7  Height:   Ht Readings from Last 1 Encounters:  09/03/22 5\' 10"  (1.778 m)    Weight:    Wt Readings from Last 1 Encounters:  09/08/22 43 kg    Ideal Body Weight:     BMI:  Body mass index is 13.6 kg/m.  Estimated Nutritional Needs:   Kcal:  2200-2500 kcals  Protein:  110-130 gm  Fluid:  >/= 2 L/day  [name], RD, LDN, CNSC.
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
## 2022-09-14 NOTE — Progress Notes (Signed)
Physical Therapy Session Note  Patient Details  Name: Shane Klein MRN: [id number] Date of Birth: 07-08-1990  Today's Date: 09/14/2022 PT Individual Time: 4098-1191 PT Individual Time Calculation (min): 27 min   Short Term Goals: Week 2:  PT Short Term Goal 1 (Week 2): Pt will require mod A x 1 with w/c<>mat transfer PT Short Term Goal 2 (Week 2): Pt will require mod A with STS PT Short Term Goal 3 (Week 2): Pt will require (S) for w/c part management PT Short Term Goal 4 (Week 2): Pt will require mod A with supine<>sit  Skilled Therapeutic Interventions/Progress Updates:       Pt in bed to start - agreeable to therapy session. Flat affect.   Donned pants with maxA for threading and pulling over hips at bed level. Rolling in bed without assist using hospital bed rails. Supine<>sitting EOB with supervision with HOB flat, ++ time but able. Removes hospital gown without assist and don's shirt with modA for threading and pulling over back. Fair sitting balance at EOB without LOB, unsupported.   ModA squat pivot transfer from EOB to w/c, towards his R side. Patient assisted for repositioning in ultralight weight wheelchair. Patient with awkward fitting due to height (knees above hips) and reporting being uncomfortable with feet on rigid footplate. May benefit from swing away's to help with transferring and positioning. Patient noted that his ostomy bag was leaking (not full) from dressing site. LPN made aware for dressing change. Direct handoff of care with all needs met.     Therapy Documentation Precautions:  Precautions Precautions: Fall, Other (comment) Precaution Comments: L JP drain, R colostomy/ileostomy, abdominal wound, R foot/ankle wound Required Braces or Orthoses: Other Brace Other Brace: B LE PRAFOs Restrictions Weight Bearing Restrictions: Yes RUE Weight Bearing: Weight bearing as tolerated RLE Weight Bearing: Weight bearing as tolerated LLE Weight Bearing: Weight  bearing as tolerated Other Position/Activity Restrictions: R UE no lifting > 5 lbs; no ROM restrictions in R UE General:   Therapy/Group: Individual Therapy  Orrin Brigham 09/14/2022, 7:33 AM

## 2022-09-14 NOTE — Progress Notes (Addendum)
Dr. Riley Kill notified of patient's hemoglobin.      Tilden Dome, LPN
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
## 2022-09-14 NOTE — Progress Notes (Signed)
Deatra Ina, aware of patient's hemoglobin.       Tilden Dome, LPN

## 2022-09-14 NOTE — Progress Notes (Signed)
Physical Therapy Session Note  Patient Details  Name: Shane Klein MRN: [id number] Date of Birth: 03/24/1991  Today's Date: 09/14/2022 PT Individual Time: 1301-1405 PT Individual Time Calculation (min): 64 min   Short Term Goals: Week 2:  PT Short Term Goal 1 (Week 2): Pt will require mod A x 1 with w/c<>mat transfer PT Short Term Goal 2 (Week 2): Pt will require mod A with STS PT Short Term Goal 3 (Week 2): Pt will require (S) for w/c part management PT Short Term Goal 4 (Week 2): Pt will require mod A with supine<>sit   Skilled Therapeutic Interventions/Progress Updates:  Patient supine in bed and completing lunch on entrance to room. Patient alert and agreeable to PT session.   Patient with no pain complaint at start of session.  Therapeutic Activity: Bed Mobility: Pt performed supine --> sit with close supervision and requires extra time to complete. No vc required. Returns to supine in bed at end of session with ModA for LLE to reach bed surface.  Transfers: Pt performed squat pivot bed-->TIS w/c from max inflated bed surface. Cues for technique including lateral scoot along bed to be closer to w/c, forward lean to bring weight over feet and push from LUE with palm flat on bed. Requires ModA +1 with skilled +2 for safety.  Pt performed sit<>stand transfers throughout session from TIS w/c with MinA +2 and 3 musketeer technique(requires heavy UE support at elbows/ shoulders. Is able to scoot forward and back in w/c with extra effort, correct WB, to prep for transfers.  Provided vc/ tc for more posterior Bil foot placement, forward lean, and increasing effort to push through BLE for rise to stance. Also cued to reach back for w/c with LUE to assist with descent to sit and performs with MinA.   Neuromuscular Re-ed: NMR facilitated during session with focus on standing balance/ tolerance. Pt guided in scap sets and glute sets with posterior pelvic tilt in order to improve overall  upright posturing. He is able to perform with 3 musketeer technique for balance. Then stands 2nd bout with 2 person assist and reducing to 1 person assist under LUE. Continued cues for upright posturing provided. Is able to stand 1 bout and 2nd bout for under .   NMR performed for improvements in motor control and coordination, balance, sequencing, judgement, and self confidence/ efficacy in performing all aspects of mobility at highest level of independence.   Gait Training:  Pt ambulated 10' x1 using 2 person, technique with overall Min/ ModA +2 up to ModA +2 for fatigue. Improvement over yesterday in distance and ability to advance LLE without assist for 3 steps. With fatigue is still able to initiate advancement. No buckling noted bilaterally but pt does continue to use BUE significantly for overall weight bearing.   With improvement in overall mobility, EVA walker brought to pt with explanation of use of Bil elbows on padded supports. Pt willing to attempt stance at EVA walker. Pt rises to stand with split UE technique and Lhand pushing from armrest with RUE up to EVA walker support. MinA +2 to stand with vc/ tc for BLE facilitation into extension. Once standing, pt pushes walker forward to take steps. Is able to advance BLE with no physical assist, EVA walker managed with 2 person assist and MinA to pt for upright posturing. Pt is able to reach 5' distance with overall Min/ModA +2 mostly for walker mgmt d/t pt's heavy reliance on BUE to maintain upright  posture.   Patient supine in bed at end of session with brakes locked, and all needs within reach in bed or on tray table.    Therapy Documentation Precautions:  Precautions Precautions: Fall, Other (comment) Precaution Comments: L JP drain, R colostomy/ileostomy, abdominal wound, R foot/ankle wound Required Braces or Orthoses: Other Brace Other Brace: B LE PRAFOs Restrictions Weight Bearing Restrictions: Yes RUE Weight  Bearing: Weight bearing as tolerated (through elbow only) RLE Weight Bearing: Weight bearing as tolerated LLE Weight Bearing: Weight bearing as tolerated Other Position/Activity Restrictions: RUE WBAT thru elbow only, no lifting > 5 lbs; no ROM restrictions General:   Vital Signs: Therapy Vitals Temp: 97.9 F (36.6 C) Temp Source: Oral Pulse Rate: 79 Resp: 18 BP: 100/61 Patient Position (if appropriate): Lying Oxygen Therapy SpO2: 100 % O2 Device: Room Air Pain:  Pt relates tightness in Bil calves during stance this day. No rating of pain provided. Positioned at end of session with Bil PRAFOs donned for improved Bil ankle ROM.   Therapy/Group: Individual Therapy  Loel Dubonnet PT, DPT, CSRS 09/14/2022, 7:19 AM

## 2022-09-15 MED ORDER — BOOST / RESOURCE BREEZE PO LIQD CUSTOM
1.0000 | Freq: Three times a day (TID) | ORAL | Status: DC
  Administered 2022-09-15 – 2022-09-16 (×3): 1 via ORAL

## 2022-09-15 NOTE — Progress Notes (Signed)
Physical Therapy Session Note  Patient Details  Name: Shane Klein MRN: [id number] Date of Birth: [birth year]  Today's Date: 09/15/2022 PT Individual Time: 1122-1210, 1415 - 1540  PT Individual Time Calculation (min): 48 min , 85 min   Short Term Goals: Week 2:  PT Short Term Goal 1 (Week 2): Pt will require mod A x 1 with w/c<>mat transfer PT Short Term Goal 2 (Week 2): Pt will require mod A with STS PT Short Term Goal 3 (Week 2): Pt will require (S) for w/c part management PT Short Term Goal 4 (Week 2): Pt will require mod A with supine<>sit  Skilled Therapeutic Interventions/Progress Updates:      Therapy Documentation Precautions:  Precautions Precautions: Fall, Other (comment) Precaution Comments: L JP drain, R colostomy/ileostomy, abdominal wound, R foot/ankle wound Required Braces or Orthoses: Other Brace Other Brace: B LE PRAFOs Restrictions Weight Bearing Restrictions: Yes RUE Weight Bearing: Weight bearing as tolerated (through elbow only) RLE Weight Bearing: Weight bearing as tolerated LLE Weight Bearing: Weight bearing as tolerated Other Position/Activity Restrictions: RUE WBAT thru elbow only, no lifting > 5 lbs; no ROM restrictions  Treatment Session 1:   Pt received semi-reclined in bed, agreeable to PT session with emphasis on transfer and gait training. Pt with unrated B LE pain, provided repositioning for relief. Pt (S) with bed features and increased time for supine to sit and CGA for lateral scooting transfer from bed to TIS w/c. Pt transported total A for time management to main gym hallway and PT retrieved Swedish/EVA walker. Pt requires mod A with STS and mod A x 2 with gait training ~12 ft. Pt requires assist for L LE advancement and block due to mild knee instability. Pt requires additional assist for steering guidance. PT educated pt on AFO's and scheduled for consult for next week. Plan to continue gait training in upcomming sessions with ace wrap for  dorsiflexion assist. Pt left seated in TIS w/c at bedside with all needs in reach and alarm on.   Treatment Session 2:   Pt received seated in ultra lightweight manual w/c and able to tolerate sitting in w/c ~45 minutes, plan to continue to progress upright sitting tolerance. Pt transported by w/c for time management and energy conservation to therapy gym. Pt requires mod verbal cues for w/c placement and transfer set up to mat table and mod A for squat pivot. Pt mod A for squat pivot to TIS w/c to follow patient with gait training. Pt requires mod A x 2 with gait with EVA/Swedish walker 28 ft + 12 ft with assist for left knee block. PT applied bilateral dorsiflexion assist ace wrap and pt able to advance B LE's without assistance. Pt requires mod verbal and tactile cueing to address shoulder hike, forward trunk flexion and decreased quad/gluteal activation. Pt encouraged following session and requires max A x 1 with squat pivot to return to bed from w/c. Pt left semi-reclined in bed with all needs in reach. Pt with unrated B LE pain, patient provided repositioning and rest for relief.    Therapy/Group: Individual Therapy  Truitt Leep Truitt Leep PT, DPT  09/15/2022, 7:54 AM

## 2022-09-15 NOTE — Consult Note (Signed)
WOC Nurse ostomy follow up Stoma type/location: RUQ ileostomy  mother is not in room at this time.  Patient is wearing a 1 piece flat pouch that he states was applied last night when his 2 piece leaked.  He states he likes this one piece better.  States there is no hard plastic ring and its more comfortable.   Stomal assessment/size: 1 1/4" pink moist and productive of soft yellow stool  Not liquid today.   Peristomal assessment: not assessed  pouch was changed last night, mother is not present for teaching and patient does not want it changed.  Treatment options for stomal/peristomal skin: barrier ring.  Was using flat 2 piece pouch system and was placed in 1 piece by nursing staff last evening.  He initially feels like he prefers this pouch/  Output soft yellow stool  not liquid at this time.  Ostomy pouching: 1pc. (Formerly in 2 piece)  Education provided: I explain that a full pouch can pull and cause the seal to break on a 1 piece pouch.  A 2 piece can hold up to heavier weight a little better due to plastic ring snapping onto the barrier.  He understands that this one piece will need to be emptied promptly.  We will let him try this one piece now and determine if this system will work for him.  Next teaching session will be Monday 5/27 with mother present.  At that time, we can determine if we need to switch to 1 or 2 piece.  If leaks occur between now and Monday, bedside staff can change.  Supplies are in room.  Enrolled patient in [place] Secure Start Discharge program: Yes Will follow.  Mike Gip MSN, RN, FNP-BC CWON Wound, Ostomy, Continence Nurse Outpatient [facility] [phone] Pager [phone]
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
## 2022-09-15 NOTE — Progress Notes (Signed)
Occupational Therapy Session Note  Patient Details  Name: Shane Klein MRN: [id number] Date of Birth: 04/19/1991  Today's Date: 09/15/2022 OT Individual Time: 0900-1000 OT Individual Time Calculation (min): 60 min   Short Term Goals: Week 2:  OT Short Term Goal 1 (Week 2): Pt will perform UB ADLs with Setup A. OT Short Term Goal 2 (Week 2): Pt will perform LB ADLs with Mod A + LRAD. OT Short Term Goal 3 (Week 2): Pt will performs squat pivot transfers with consistent Mod A.  Skilled Therapeutic Interventions/Progress Updates:   Pt seen for skilled OT session with focus on progression of self care compensatory strategies and AE. Pt deferring shower until "Friday" pt reported when offered. OT issued and briefly trained in LH sponge. Pt able to self direct OT to empty ostomy bag. Pt bathed UB with set up and peri region, tops of LE's with CGA. LE bathing and buttocks with total A. OT trng with use of reacher to thread pants over feet and facil bridging and rolling with overall max fading to mod A. UB pull over shirt with set up. Oral care with set up. Left pt with nursing for cares and meds.    Pain: mild in L LE with bathing and dressing but un-rated with no functional interference this visit   Therapy Documentation Precautions:  Precautions Precautions: Fall, Other (comment) Precaution Comments: L JP drain, R colostomy/ileostomy, abdominal wound, R foot/ankle wound Required Braces or Orthoses: Other Brace Other Brace: B LE PRAFOs Restrictions Weight Bearing Restrictions: Yes RUE Weight Bearing: Weight bearing as tolerated (through elbow only) RLE Weight Bearing: Weight bearing as tolerated LLE Weight Bearing: Weight bearing as tolerated Other Position/Activity Restrictions: RUE WBAT thru elbow only, no lifting > 5 lbs; no ROM restrictions    Therapy/Group: Individual Therapy  Vicenta Dunning 09/15/2022, 7:55 AM

## 2022-09-15 NOTE — Progress Notes (Signed)
Physical Therapy Session Note  Patient Details  Name: Shane Klein MRN: [id number] Date of Birth: October 22, 1990  Today's Date: 09/15/2022 PT Individual Time: 1308-6578 PT Individual Time Calculation (min): 27 min   Short Term Goals: Week 2:  PT Short Term Goal 1 (Week 2): Pt will require mod A x 1 with w/c<>mat transfer PT Short Term Goal 2 (Week 2): Pt will require mod A with STS PT Short Term Goal 3 (Week 2): Pt will require (S) for w/c part management PT Short Term Goal 4 (Week 2): Pt will require mod A with supine<>sit  Skilled Therapeutic Interventions/Progress Updates:    Pt received sitting in TIS w/c finishing lunch and agreeable to therapy session. Pt appears in overall good spirits.  Transported to gym in w/c for time management and energy conservation.  L lateral scoot/partial squat pivot TIS w/c>EOM with light mod assist of 1 - cuing for increased anterior trunk lean and using L UE support and B LEs to push through to lift hips and decrease shearing force - pt demos significant improvement in pain tolerance with B LE ankle DF and WBing to allow pt to bring his feet back underneath him to complete transfers more safely and more independently compared to when last seen by this therapist. Completed same transfer from EOM>K5 w/c but pt using RUE support around therapist shoulder's to maintain WBing through elbow only since pt unable to use armrest like in previous transfer.   Per Gaye Pollack, primary PT, Dr. Berline Chough has approved pt to use R UE to perform wheelchair propulsion. B UE w/c propulsion ~120ft back to his room with supervision and pt benefiting from theraband wrap on R rim that has previously been placed - decreased propulsion speed with short push-strokes.  Pt agreeable to remain sitting up in K5 wheelchair to improve upright, activity tolerance. Pt left with needs in reach, lines intact, nursing staff aware of pt's position, and seat belt alarm on.  Therapy  Documentation Precautions:  Precautions Precautions: Fall, Other (comment) Precaution Comments: L JP drain, R colostomy/ileostomy, abdominal wound, R foot/ankle wound Required Braces or Orthoses: Other Brace Other Brace: B LE PRAFOs Restrictions Weight Bearing Restrictions: Yes RUE Weight Bearing: Weight bearing as tolerated (through elbow only) RLE Weight Bearing: Weight bearing as tolerated LLE Weight Bearing: Weight bearing as tolerated Other Position/Activity Restrictions: RUE WBAT thru elbow only, no lifting > 5 lbs; no ROM restrictions   Pain: Reports some L posterior hip/pelvic region pain during transfers, but does not limit participation. Pt also states he is having decreased pain in his feet.    Therapy/Group: Individual Therapy  Ginny Forth , PT, DPT, NCS, CSRS 09/15/2022, 12:17 PM

## 2022-09-16 LAB — CBC WITH DIFFERENTIAL/PLATELET
Abs Immature Granulocytes: 0.01 10*3/uL (ref 0.00–0.07)
Basophils Absolute: 0.1 10*3/uL (ref 0.0–0.1)
Basophils Relative: 1 %
Eosinophils Absolute: 0.3 10*3/uL (ref 0.0–0.5)
Eosinophils Relative: 5 %
HCT: 24.8 % — ABNORMAL LOW (ref 39.0–52.0)
Hemoglobin: 8 g/dL — ABNORMAL LOW (ref 13.0–17.0)
Immature Granulocytes: 0 %
Lymphocytes Relative: 16 %
Lymphs Abs: 1.1 10*3/uL (ref 0.7–4.0)
MCH: 26.2 pg (ref 26.0–34.0)
MCHC: 32.3 g/dL (ref 30.0–36.0)
MCV: 81.3 fL (ref 80.0–100.0)
Monocytes Absolute: 0.8 10*3/uL (ref 0.1–1.0)
Monocytes Relative: 11 %
Neutro Abs: 4.8 10*3/uL (ref 1.7–7.7)
Neutrophils Relative %: 67 %
Platelets: 472 10*3/uL — ABNORMAL HIGH (ref 150–400)
RBC: 3.05 MIL/uL — ABNORMAL LOW (ref 4.22–5.81)
RDW: 16 % — ABNORMAL HIGH (ref 11.5–15.5)
WBC: 7.1 10*3/uL (ref 4.0–10.5)
nRBC: 0 % (ref 0.0–0.2)

## 2022-09-16 LAB — BASIC METABOLIC PANEL
Anion gap: 8 (ref 5–15)
BUN: 15 mg/dL (ref 6–20)
CO2: 21 mmol/L — ABNORMAL LOW (ref 22–32)
Calcium: 9.1 mg/dL (ref 8.9–10.3)
Chloride: 107 mmol/L (ref 98–111)
Creatinine, Ser: 0.53 mg/dL — ABNORMAL LOW (ref 0.61–1.24)
GFR, Estimated: >60 mL/min (ref >60)
Glucose, Bld: 87 mg/dL (ref 70–99)
Potassium: 3.8 mmol/L (ref 3.5–5.1)
Sodium: 136 mmol/L (ref 135–145)

## 2022-09-16 MED ORDER — DULOXETINE HCL 60 MG PO CPEP
120.0000 mg | ORAL_CAPSULE | Freq: Every day | ORAL | Status: DC
  Administered 2022-09-16 – 2022-10-04 (×19): 120 mg via ORAL
  Filled 2022-09-16 (×19): qty 2
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
## 2022-09-16 NOTE — Progress Notes (Signed)
TXs completed as ordered, patient tolerated well. Relaxing in bed, personal items within reach

## 2022-09-16 NOTE — Progress Notes (Signed)
Physical Therapy Session Note  Patient Details  Name: Shane Klein MRN: [id number] Date of Birth: 02-26-1991  Today's Date: 09/16/2022 PT Individual Time: 1100-1210, 1400-1513  PT Individual Time Calculation (min): 70 min , 72 min   Short Term Goals: Week 2:  PT Short Term Goal 1 (Week 2): Pt will require mod A x 1 with w/c<>mat transfer PT Short Term Goal 2 (Week 2): Pt will require mod A with STS PT Short Term Goal 3 (Week 2): Pt will require (S) for w/c part management PT Short Term Goal 4 (Week 2): Pt will require mod A with supine<>sit  Skilled Therapeutic Interventions/Progress Updates:      Therapy Documentation Precautions:  Precautions Precautions: Fall, Other (comment) Precaution Comments: L JP drain, R colostomy/ileostomy, abdominal wound, R foot/ankle wound Required Braces or Orthoses: Other Brace Other Brace: B LE PRAFOs Restrictions Weight Bearing Restrictions: Yes RUE Weight Bearing: Weight bearing as tolerated RLE Weight Bearing: Weight bearing as tolerated LLE Weight Bearing: Weight bearing as tolerated Other Position/Activity Restrictions: RUE WBAT thru elbow only, no lifting > 5 lbs; no ROM restrictions  Treatment Session 1:   Pt received semi-reclined in bed sleeping and awakens to therapist voice. PT provided pt with cold wash cloth to increase arousal. Pt reports fatigue and unrated L LE pain that is predominantly dull and achy and occasionally sharp. Pt agreeable to PT session with max encouragement.   Pt (S) for supine to sit with mild HOB elevation and use of bedrails. Pt mod A x 1 with squat pivot to TIS and transported dependently for time management to main gym hallway for gait training.   PT donned bilateral ace wraps for dorsiflexion assist. Pt requires min A x 2 with gait with assist for left knee block and walker navigation with close w/c follow. Pt utilized Swedish/Eva walker and requires min cues for sequencing for STS transfer set-up. Pt min  A with STS and min A x 2 with gait 25 ft. Pt requires visual feedback with mirror and verbal cues for left LE foot clearance and advancement. Pt progressed to ambulation with R platform rolling walker and ambulated additional 16 feet with similar assist levels. Pt encouraged by progress and reports he was glad he walked today.   Pt requires mod A with squat pivot to return to bed and able to hook R LE under L LE to attempt picking up legs to lie down. Pt requires min A for LE mobilization with sit to lying and left in bed with all needs in reach.   Treatment Session 2:   Pt received semi-reclined in bed, agreeable to PT session with emphasis on w/c selection with [employer], [employer]. [facility] provided pt with loaner wheelchair to trial. Pt with decreased reports of B LE pain in session. Pt (S) for safety with supine to sit and mod A x 1 with squat pivot to K5 w/c.  Pt (S) for w/c propulsion ~200 ft. Pt engaged in discussion with [employer], OT and PT regarding wheelchair features. Pt and PT requesting w/c with removable foot plate to increase functional mobility with standing and gait as pt currently with rigid foot rests. Pt requires min A for scooting forward out of K5 and mod A for STS. Pt ambulated 10 ft + 24 ft  min A x 2 with right platform RW without observed buckling and adequate foot clearance with bilateral ace wraps donned for dorsiflexion assist. Pt propelled w/c to room and left semi-reclined  in bed with all needs in reach.   Therapy/Group: Individual Therapy  Truitt Leep Truitt Leep PT, DPT  09/16/2022, 7:33 AM
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
## 2022-09-16 NOTE — Progress Notes (Signed)
Occupational Therapy Session Note  Patient Details  Name: Shane Klein MRN: [id number] Date of Birth: 04/15/1991  Today's Date: 09/16/2022 OT Individual Time: 0845-1000 1st Session;   2nd Session non-billed w/c consult with PT  OT Individual Time Calculation (min): 75 min, 30 min non billed d/t PT billing     Short Term Goals: Week 2:  OT Short Term Goal 1 (Week 2): Pt will perform UB ADLs with Setup A. OT Short Term Goal 2 (Week 2): Pt will perform LB ADLs with Mod A + LRAD. OT Short Term Goal 3 (Week 2): Pt will performs squat pivot transfers with consistent Mod A.  Skilled Therapeutic Interventions/Progress Updates:    Session 1:  Pt asleep upon OT arrival but open for session with min encouragement. Pt continues to defer shower until "Friday" pt reported when offered again this day. OT adapted LH sponge with heat gun to curve for increased functionality with wrist strap to secure from dropping due to limited hand grasp strength. Pt able to self direct OT to empty ostomy bag and participated in holding graduated cylinder for collection this session demo improved openness to condition mngt.  Pt bathed UB with set up and peri region, tops of LE's with CGA. LE bathing and buttocks with total A. OT trng with use of reacher to thread pants over feet and facil bridging and rolling with progression to mod A bed level. UB pull over shirt with set up. Oral care with set up. Left pt with nursing for cares and meds bed level due to pt request to rest until next PT session with 1 hr between to get rest despite OT encouragement to move OOB to improve overall activity tolerance.   Pain:  Mild L LE pain un-rated when moved otherwise 0/10 throughout session.   Session 2:  Non-billed consult session as PT billed for session. OT input with ATP and PT present for trial of loaner ultra lightweight w/c with custom back and cushion. Transfer OOB vial lateral scoot, w/c propulsion in hallway then RW with  platform trial from loaner. See PT for levels of assist. OT in agreement with loaner for current functional level despite novel set up and increased assist needed for scooting to edge o chair and sit to and from stand effort. Left pt with PT and ATP.    Pain:   Mild L LE pain during placement on w/c rests otherwise denied Therapy Documentation Precautions:  Precautions Precautions: Fall, Other (comment) Precaution Comments: L JP drain, R colostomy/ileostomy, abdominal wound, R foot/ankle wound Required Braces or Orthoses: Other Brace Other Brace: B LE PRAFOs Restrictions Weight Bearing Restrictions: Yes RUE Weight Bearing: Weight bearing as tolerated RLE Weight Bearing: Weight bearing as tolerated LLE Weight Bearing: Weight bearing as tolerated Other Position/Activity Restrictions: RUE WBAT thru elbow only, no lifting > 5 lbs; no ROM restrictions    Therapy/Group: Individual Therapy  Vicenta Dunning 09/16/2022, 7:31 AM

## 2022-09-17 NOTE — Progress Notes (Signed)
Occupational Therapy Session Note  Patient Details  Name: Shane Klein MRN: [id number] Date of Birth: 1991/03/29  {CHL IP REHAB OT TIME CALCULATIONS:[id number]}   Short Term Goals: Week 2:  OT Short Term Goal 1 (Week 2): Pt will perform UB ADLs with Setup A. OT Short Term Goal 2 (Week 2): Pt will perform LB ADLs with Mod A + LRAD. OT Short Term Goal 3 (Week 2): Pt will performs squat pivot transfers with consistent Mod A.  Skilled Therapeutic Interventions/Progress Updates:  Pt received *** for skilled OT session with focus on ***. Pt agreeable to interventions, demonstrating overall *** mood. Pt reported ***/10 pain, stating "***" in reference to ***. OT offering intermediate rest breaks and positioning suggestions throughout session to address pain/fatigue and maximize participation/safety in session.    Pt remained *** with all immediate needs met at end of session. Pt continues to be appropriate for skilled OT intervention to promote further functional independence.   Therapy Documentation Precautions:  Precautions Precautions: Fall, Other (comment) Precaution Comments: L JP drain, R colostomy/ileostomy, abdominal wound, R foot/ankle wound Required Braces or Orthoses: Other Brace Other Brace: B LE PRAFOs Restrictions Weight Bearing Restrictions: Yes RUE Weight Bearing: Weight bearing as tolerated RLE Weight Bearing: Weight bearing as tolerated LLE Weight Bearing: Weight bearing as tolerated Other Position/Activity Restrictions: RUE WBAT thru elbow only, no lifting > 5 lbs; no ROM restrictions   Therapy/Group: Individual Therapy  Lou Cal, OTR/L, MSOT  09/17/2022, 8:58 PM
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
## 2022-09-17 NOTE — Progress Notes (Signed)
Occupational Therapy Session Note  Patient Details  Name: Shane Klein MRN: [id number] Date of Birth: 11-21-1990  Today's Date: 09/17/2022 OT Individual Time: 0915-1000 OT Individual Time Calculation (min): 45 min    Short Term Goals: Week 2:  OT Short Term Goal 1 (Week 2): Pt will perform UB ADLs with Setup A. OT Short Term Goal 2 (Week 2): Pt will perform LB ADLs with Mod A + LRAD. OT Short Term Goal 3 (Week 2): Pt will performs squat pivot transfers with consistent Mod A.  Skilled Therapeutic Interventions/Progress Updates:upon approach for OT, patient was supine in bed receiving nursing care, including donning new colostomy bag as prior one was leaking at top and bottom... staff unable to indentity why but staff will continue to monitor positioning and leakage while working with/attending to patient.  After nursing care, patient initially stated he would try to shower today but he was not sure if he would be able to listen to his lawyer speak on his behalf on his iPad.  Also  by the time patient requests for assistance (e.g., remove shorts he was wearing due to some BM from colostomy had gotten onto his shorts and pad).   Patient concurred to complete  bathing and dressing of blue scrubs beside due to limited time left in session at that point.     He completed the ADL in bed with HOB elevated as follows:   UB bathing and dressing = Min A to pull down shirt from overhead after patient was able to don to midback  LB bathing and dressing = Mod A for washing buttocks, feet, and donning pants overtoes and to pull up in back over hips Bed mobility= max A  Patient will benefit from continued OT to address all goals as he continues to heal to increase self care and preferred occupations as well to specifically address Postural strength and stability (for ADLs and unsupported upright posture), lateral rolls, bed mobility, and side lying to pull up pants  Continue patient POC  Therapy  Documentation Precautions:  Precautions Precautions: Fall, Other (comment) Precaution Comments: L JP drain, R colostomy/ileostomy, abdominal wound, R foot/ankle wound Required Braces or Orthoses: Other Brace Other Brace: B LE PRAFOs Restrictions Weight Bearing Restrictions: Yes RUE Weight Bearing: Weight bearing as tolerated RLE Weight Bearing: Weight bearing as tolerated LLE Weight Bearing: Weight bearing as tolerated Other Position/Activity Restrictions: RUE WBAT thru elbow only, no lifting > 5 lbs; no ROM restrictions General: General OT Amount of Missed Time: 15 Minutes nursing care  Pain:8/10 "all the time in the legs... just hurts"   RN gave pain meds    Therapy/Group: Individual Therapy  Bud Face [facility] 09/17/2022, 11:29 AM

## 2022-09-17 NOTE — Progress Notes (Signed)
Physical Therapy Session Note  Patient Details  Name: Shane Klein MRN: [id number] Date of Birth: Aug 10, 1990  Today's Date: 09/17/2022 PT Individual Time: 1101-1158 + 4098-1191 PT Individual Time Calculation (min): 57 min + 81 min  Short Term Goals: Week 2:  PT Short Term Goal 1 (Week 2): Pt will require mod A x 1 with w/c<>mat transfer PT Short Term Goal 2 (Week 2): Pt will require mod A with STS PT Short Term Goal 3 (Week 2): Pt will require (S) for w/c part management PT Short Term Goal 4 (Week 2): Pt will require mod A with supine<>sit  Skilled Therapeutic Interventions/Progress Updates:     SESSION 1: Pt presents in room in bed, agreeable to PT but reporting significant pain in bilateral feet with hypersensitivity and increased swelling noted on LLE. RN notified and medicates during session. Session focused on desensitization for bilateral feet beginning with light touch sensation and progress to deep pressure to promote improved tolerance to weightbearing bilaterally.  Pt tolerates light touch via washcloth to dorsal/plantar surfaces of bilateral feet x3 minutes with no increase in pain or hypersensitivity. Therapist then provides increased pressure using cocoa butter stick of patient's x5 minutes each foot with pt able to tolerate with reports of increased pain but does not require rest breaks able to tolerate with verbal cues for breath management. Pt then tolerates x8 minutes deep pressure tissue mobilization bilaterally, requires x2 rest breaks due to increased pain with mobilization however reporting decreased sensitivity overall during rest breaks and following activity. Pt then completes x5 BLE hip extension (leg press) from flexed position, therapist assist with hip flexion and pt pushing through unilateral LE against manual pressure. Pt requires rest breaks between reps with LLE due to increased pain with weightbearing through L foot as well as decreased tolerance to gastroc  stretch.  Pt positioned at end of session with pillows at end of bed to facilitate bilateral foot DF due to decreased tolerance of PRAFOS, heels floating. Pt remains in this position in supine with all needs within reach, call light in place, and all 4 rails raised per pt request at end of session.  SESSION 2: Pt presents in room in bed, agreeable to PT. Pt continues to report sensitivity in bilateral feet however agreeable to OOB session and gait training at this time. Session focused on transfer training, WC mobility and gait training. Pt completes squat pivot transfers with mod assist, pt placing arms around therapist but able to push through legs for gluteal clearance, assist required for pivot.  Pt completes bed mobility supine to sit with bed features with supervision with elevated HOB and use of bed rails. Pt transfers from bed to [facility] and self propels 150' from room to main gym with min assist to initiate and maintain momentum with pt demonstrating difficulty propelling with RUE.  Therapist provides bilateral DF assist wrap for foot clearance and pt then completes sit<>stand mod assist from [facility] to RUE PFRW due to poor foot placement secondary to foot plate with pt requiring assist for anterior wt shift and 2nd person for holding PFRW. Pt then ambulates 12' from [facility] to mat with quarter turn, therapist providing min assist for stability, 2nd person assist for RW mgmt, pt with no buckling noted, short step length bilaterally and slow gait speed however good foot placement noted. Able to abduct minimally for turning and able to take steps backwards with increased time for positioning to sit on mat.   Pt requires extended seated  rest break due to fatigue and for energy conservation. Pt then completes sit to stand with mod assist from EOM to RUE PFRW with mod assist, verbal cues for foot placement and visual demonstration for anterior wt shift. Pt ambulates 20' from mat back to chair, completing quarter  turn with min assist x2 (2nd person assist for RW mgmt) ambulates straight away with increased speed and step length, good foot placement bilaterally no scissoring noted however decreased heel contact. Pt completes 90 deg turn with min assist x2, good foot placement noted and able to step backwards for positioning prior to sit however requires mod assist for sitting due to position of foot plate on WC for hip placement posteriorly on WC.  Pt attempts third sit<>stand however with increased fatigue, completes partial stand but unable to transition LUE from [facility] to walker. Pt sits back to [facility] and is transported dependently to room in [facility]. Pt completes transfer back to bed and completes sit to supine with min/mod assist, able to manage RLE into bed but requires assist for LLE and for repositioning in supine.  Pt with noted spot tenderness to posterior R ankle with dark spot noted, RN present and notified during session. Pt positioned with heels floating, pillow positioned at foot of bed to facilitate L ankle DF due to poor tolerance of PRAFO, all needs within reach, call light in place, and all 4 rails elevated per pt request at end of session.   Therapy Documentation Precautions:  Precautions Precautions: Fall, Other (comment) Precaution Comments: L JP drain, R colostomy/ileostomy, abdominal wound, R foot/ankle wound Required Braces or Orthoses: Other Brace Other Brace: B LE PRAFOs Restrictions Weight Bearing Restrictions: Yes RUE Weight Bearing: Weight bearing as tolerated RLE Weight Bearing: Weight bearing as tolerated LLE Weight Bearing: Weight bearing as tolerated Other Position/Activity Restrictions: RUE WBAT thru elbow only, no lifting > 5 lbs; no ROM restrictions   Therapy/Group: Individual Therapy  Edwin Cap PT, DPT 09/17/2022, 11:30 AM

## 2022-09-17 NOTE — Progress Notes (Addendum)
This nurse had been checking pt's ileostomy just about every hour, but pt called and stated that his ostomy was leaking. Not sure why, but there was leakage at the top and the bottom of the openings, but there were no leaks prior to this time. See flowsheet for the stool amounts. Ostomy 2 piece bag was changed.

## 2022-09-18 NOTE — Progress Notes (Signed)
Patient refused PRAFO boots tonight. Reports pain in bilateral feet. PRN meds given. Feet elevated on pillows.
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
## 2022-09-19 NOTE — Progress Notes (Signed)
Patient tolerated bilateral PRAFOs for 1.5 hour this shift before requesting they be removed. Legs elevated and heels floated. Patient wearing right resting hand splint throughout shift. Ostomy bag changed x 1 due to leakage. Ostomy with high ouput-emptied about every hour and a half.

## 2022-09-19 NOTE — Progress Notes (Signed)
Patient ostomy bag emptied x2 by this time. Patient called stating bag was leaking, although it was just recently emptied. This Clinical research associate changed the ostomy bag. Patient called within 30 minutes stating the bag was leaking again. This Clinical research associate was in another room, charge nurse notified and assisted with changing bag again.

## 2022-09-19 NOTE — Progress Notes (Signed)
Physical Therapy Weekly Progress Note  Patient Details  Name: Shane Klein MRN: [id number] Date of Birth: 1990/11/07  Beginning of progress report period: Sep 12, 2022 End of progress report period: Sep 19, 2022  Today's Date: 09/19/2022 PT Individual Time: 1440-1546 PT Individual Time Calculation (min): 66 min   Patient has met 3 of 4 short term goals.  Pt is making excellent progress to long term goals and largely requires CGA/min A for bed mobilty, mod A x 1 with transfers and max A x 1 for gait up to 40 ft with close w/c follow and platform R rolling walker. Plan to continue patient and family education and adjust loaner w/c appropriately along with AFO consult.   Patient continues to demonstrate the following deficits muscle weakness, muscle joint tightness, and muscle paralysis, decreased cardiorespiratoy endurance, decreased coordination, and decreased standing balance and decreased postural control and therefore will continue to benefit from skilled PT intervention to increase functional independence with mobility.  Patient progressing toward long term goals..  Continue plan of care.  PT Short Term Goals Week 1:  PT Short Term Goal 1 (Week 1): Pt will perform rolling R/L with min assist PT Short Term Goal 1 - Progress (Week 1): Met PT Short Term Goal 2 (Week 1): Pt will perform supine<>sit with max assist of 1 PT Short Term Goal 2 - Progress (Week 1): Met PT Short Term Goal 3 (Week 1): Pt will maintain static sitting balance EOM for at least 2 minutes with no more than supervision PT Short Term Goal 3 - Progress (Week 1): Met PT Short Term Goal 4 (Week 1): Pt will progress to standing tolerance in tilt table or harness support for at least 5 minutes PT Short Term Goal 4 - Progress (Week 1): Met PT Short Term Goal 5 (Week 1): Pt will transfer bed<>chair with +2 mod assist using LRAD PT Short Term Goal 5 - Progress (Week 1): Met Week 2:  PT Short Term Goal 1 (Week 2): Pt will  require mod A x 1 with w/c<>mat transfer PT Short Term Goal 1 - Progress (Week 2): Met PT Short Term Goal 2 (Week 2): Pt will require mod A with STS PT Short Term Goal 2 - Progress (Week 2): Met PT Short Term Goal 3 (Week 2): Pt will require (S) for w/c part management PT Short Term Goal 3 - Progress (Week 2): Partly met PT Short Term Goal 4 (Week 2): Pt will require mod A with supine<>sit PT Short Term Goal 4 - Progress (Week 2): Met Week 3:  PT Short Term Goal 1 (Week 3): Pt will require min A for w/c<>mat transfer PT Short Term Goal 2 (Week 3): Pt will require min A for STS PT Short Term Goal 3 (Week 3): Pt will require mod A x 1 for gait x 10 ft with LRAD  Skilled Therapeutic Interventions/Progress Updates:      Therapy Documentation Precautions:  Precautions Precautions: Fall, Other (comment) Precaution Comments: L JP drain, R colostomy/ileostomy, abdominal wound, R foot/ankle wound Required Braces or Orthoses: Other Brace Other Brace: B LE PRAFOs Restrictions Weight Bearing Restrictions: Yes RUE Weight Bearing: Weight bear through elbow only RLE Weight Bearing: Weight bearing as tolerated LLE Weight Bearing: Weight bearing as tolerated Other Position/Activity Restrictions: RUE WBAT thru elbow only, no lifting > 5 lbs; no ROM restrictions   Pt received semi-reclined in bed, agreeable to PT session with emphasis on gait training. Pt with unrated left foot pain and  increased sensitivity and provided ace wrapping for additional feedback along with positioning for relief.   Pt (S) with supine to sit and requires mod A with squat pivot transfer to TIS. Pt transported total A for time management to main gym hallway and therapist applied bilateral dorsiflexion assist ace wrapping.   Pt ambulated 29 ft with min A x 2 with R platform rolling walker and with additional assist for steering walker. Pt transitioned to gait training with max A x 1 ~40 ft with PT providing assist to steer the  walker. Pt with scissoring and decreased step length and requires verbal cues to correct but displays difficulty maintaining.   Pt transported to room and max A x 1 with squat pivot to bed and min A for sit to lying. Pt willing to have SCI mentor and participate in support group. PT to follow up with MD. Pt left semi-reclined in bed with all needs in reach.   Therapy/Group: Individual Therapy  Truitt Leep Truitt Leep PT, DPT  09/19/2022, 7:54 AM

## 2022-09-20 LAB — COMPREHENSIVE METABOLIC PANEL
ALT: 200 U/L — ABNORMAL HIGH (ref 0–44)
AST: 78 U/L — ABNORMAL HIGH (ref 15–41)
Albumin: 1.9 g/dL — ABNORMAL LOW (ref 3.5–5.0)
Alkaline Phosphatase: 118 U/L (ref 38–126)
Anion gap: 7 (ref 5–15)
BUN: 17 mg/dL (ref 6–20)
CO2: 22 mmol/L (ref 22–32)
Calcium: 8.7 mg/dL — ABNORMAL LOW (ref 8.9–10.3)
Chloride: 106 mmol/L (ref 98–111)
Creatinine, Ser: 0.61 mg/dL (ref 0.61–1.24)
GFR, Estimated: >60 mL/min (ref >60)
Glucose, Bld: 99 mg/dL (ref 70–99)
Potassium: 2.9 mmol/L — ABNORMAL LOW (ref 3.5–5.1)
Sodium: 135 mmol/L (ref 135–145)
Total Bilirubin: 0.5 mg/dL (ref 0.3–1.2)
Total Protein: 6.5 g/dL (ref 6.5–8.1)

## 2022-09-20 LAB — CBC WITH DIFFERENTIAL/PLATELET
Abs Immature Granulocytes: 0.01 10*3/uL (ref 0.00–0.07)
Basophils Absolute: 0 10*3/uL (ref 0.0–0.1)
Basophils Relative: 1 %
Eosinophils Absolute: 0.5 10*3/uL (ref 0.0–0.5)
Eosinophils Relative: 8 %
HCT: 23.7 % — ABNORMAL LOW (ref 39.0–52.0)
Hemoglobin: 7.7 g/dL — ABNORMAL LOW (ref 13.0–17.0)
Immature Granulocytes: 0 %
Lymphocytes Relative: 18 %
Lymphs Abs: 1.1 10*3/uL (ref 0.7–4.0)
MCH: 25.9 pg — ABNORMAL LOW (ref 26.0–34.0)
MCHC: 32.5 g/dL (ref 30.0–36.0)
MCV: 79.8 fL — ABNORMAL LOW (ref 80.0–100.0)
Monocytes Absolute: 0.7 10*3/uL (ref 0.1–1.0)
Monocytes Relative: 12 %
Neutro Abs: 3.8 10*3/uL (ref 1.7–7.7)
Neutrophils Relative %: 61 %
Platelets: 385 10*3/uL (ref 150–400)
RBC: 2.97 MIL/uL — ABNORMAL LOW (ref 4.22–5.81)
RDW: 16 % — ABNORMAL HIGH (ref 11.5–15.5)
WBC: 6.2 10*3/uL (ref 4.0–10.5)
nRBC: 0 % (ref 0.0–0.2)

## 2022-09-20 LAB — MAGNESIUM: Magnesium: 0.6 mg/dL — CL (ref 1.7–2.4)

## 2022-09-20 MED ORDER — POTASSIUM CHLORIDE CRYS ER 20 MEQ PO TBCR
40.0000 meq | EXTENDED_RELEASE_TABLET | Freq: Three times a day (TID) | ORAL | Status: AC
  Administered 2022-09-20 (×3): 40 meq via ORAL
  Filled 2022-09-20 (×4): qty 2

## 2022-09-20 MED ORDER — ACETAMINOPHEN 325 MG PO TABS
325.0000 mg | ORAL_TABLET | Freq: Four times a day (QID) | ORAL | Status: DC | PRN
  Administered 2022-09-23: 325 mg via ORAL
  Filled 2022-09-20: qty 1

## 2022-09-20 MED ORDER — BACLOFEN 5 MG HALF TABLET
5.0000 mg | ORAL_TABLET | Freq: Three times a day (TID) | ORAL | Status: DC
  Administered 2022-09-20 – 2022-10-05 (×46): 5 mg via ORAL
  Filled 2022-09-20 (×46): qty 1

## 2022-09-20 MED ORDER — MAGNESIUM SULFATE 4 GM/100ML IV SOLN
4.0000 g | Freq: Every day | INTRAVENOUS | Status: AC
  Administered 2022-09-20 – 2022-09-22 (×3): 4 g via INTRAVENOUS
  Filled 2022-09-20 (×3): qty 100

## 2022-09-20 NOTE — Consult Note (Signed)
WOC Nurse ostomy follow up Stoma type/location: RLQ ileostomy  Stomal assessment/size:  1 1/8" round, pink, moist and well budded  Peristomal assessment: intact  Treatment options for stomal/peristomal skin: 2" skin barrier ring  Output approximately 25 mls applesauce consistency light brown stool  Ostomy pouching: 2 piece 2 1/4" pouching system; 2 1/4" skin barrier [facility] [place] [phone]), 2 1/4" pouch [facility] [place] [phone]) and 2" barrier ring [facility] [place] [phone])  Patient states he did not like the one piece that he tried at last WOC visit.   Education provided: Mother not present.  Patient with eyes closed off and on during visit.  Discussed that patient having a wet wound next to his ostomy appears to be contributing to the skin barrier lifting in this area.  Nurse changed dressing while WOC nurse worked on ostomy, did use skin prep around entire area to help with seal.  I asked patient if he were emptying the pouch or assisting with changing pouch when it leaks and patient states he is unable to perform care to his ostomy due to limitations in use of his hands.  Patient continues to work with therapy. Patient states he plans to discharge home 10/05/2022.   Did order (5) of each of following:  2 1/4" skin barrier, 2 1/4" pouch and 2" skin barrier rings for room.   Enrolled patient in [place] [employer] Discharge program: Yes previously  WOC team will continue to follow patient for ostomy education and support.    Thank you,    Priscella Mann MSN, RN-BC, [employer] [phone]
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
## 2022-09-20 NOTE — Progress Notes (Signed)
Date and time results received: 09/20/22 0710  Test: Magnesium  Critical Value: 0.6  Name of Provider Notified: PA Harvel Ricks  Orders Received? Or Actions Taken?: No new orders received. PA to follow up with MD Lovorn.

## 2022-09-20 NOTE — Progress Notes (Signed)
Physical Therapy Session Note  Patient Details  Name: Shane Klein MRN: [id number] Date of Birth: Oct 14, 1990  Today's Date: 09/20/2022 PT Individual Time: 1115-1140, 1415-1540  PT Individual Time Calculation (min): 25 min, 85 min     Today's Date: 09/20/2022 PT Missed Time: 20 Minutes,  Missed Time Reason: Patient fatigue;Pain  Short Term Goals: Week 3:  PT Short Term Goal 1 (Week 3): Pt will require min A for w/c<>mat transfer PT Short Term Goal 2 (Week 3): Pt will require min A for STS PT Short Term Goal 3 (Week 3): Pt will require mod A x 1 for gait x 10 ft with LRAD  Skilled Therapeutic Interventions/Progress Updates:      Therapy Documentation Precautions:  Precautions Precautions: Fall, Other (comment) Precaution Comments: L JP drain, R colostomy/ileostomy, abdominal wound, R foot/ankle wound Required Braces or Orthoses: Other Brace Other Brace: B LE PRAFOs Restrictions Weight Bearing Restrictions: Yes RUE Weight Bearing: Weight bear through elbow only RLE Weight Bearing: Weight bearing as tolerated LLE Weight Bearing: Weight bearing as tolerated Other Position/Activity Restrictions: RUE WBAT thru elbow only, no lifting > 5 lbs; no ROM restrictions  Treatment Session 1:   Pt received semi-reclined in bed sleeping. Pt arouses to voice and lethargic during session. Pt reports sharp bilateral foot pain L>R and LE restlessness. Nurse notified and PT assessed vitals:    Temperature: 97.6   BP: 96/62   Pulse: 74   O2: 100   Pt unable to participated in full session an missed 20 minutes of skilled PT, plan to make up as able.   Treatment Session 2:   Pt received semi-reclined in bed, agreeable to PT session with emphasis on gait training. Pt with unrated bilateral foot pain, pre-medicated and provided repositioning/rest for relief. Pt reports foot pain has improved from AM session.   PT attempted to don lace up tennis shoes to trial AFO, but shoes ill fitting and too  small due to increased edema. Pt mother plans to bring in size 12 men's shoe.   PT donned foot up brace dependently to bilateral feet and pt requires (S) for safety for supine to sit and increased time. Pt requires mod A with squat pivot to TIS w/c with additional assist for forward scooting to position feet on floor pre transfer.   Pt transported total A for time management and energy conservation to main gym hallway. Pt requires min A for STS with R platform RW and max A x 1 with gait ~25 ft with bilateral foot up braces. PT provided steering assistance as pt with increased UE reliance on walker. Pt required seated rest break and reported increased fatigue therefore PT removed foot up brace and performed bilateral dorsiflexion assist wrapping. Pt ambulated additional ~50 ft mod A x 1 with platform RW with improved weight shift, foot clearance, and decreased UE reliance on walker. Pt provided with visual feedback (mirror) and verbal cues for upright posture as pt presents with forward flexion with increased fatigue.   Pt transported to room dependently and PT demonstrated stand pivot transfer with Sales executive. Pt requires max A x 1 for walker steering and navigation with turning and demonstrates adequate foot clearance with stepping.   Plan to continue to practice stand pivot transfers to increase independence with mobility to prepare for discharge. Pt min A for LE management with sit to lying 2/2 fatigue and pt left semi-reclined in bed with all needs in reach.   Therapy/Group: Individual Therapy  Gaye Pollack  Sandra Cockayne PT, DPT  09/20/2022, 7:34 AM

## 2022-09-20 NOTE — Progress Notes (Signed)
Occupational Therapy Session Note  Patient Details  Name: Shane Klein MRN: [id number] Date of Birth: September 29, 1990  Today's Date: 09/20/2022 OT Individual Time: 0950-1030 OT Individual Time Calculation (min): 40 min  and Today's Date: 09/20/2022 OT Missed Time: 15 Minutes Missed Time Reason: Patient fatigue  Today's Date: 09/20/2022 OT Individual Time: 4098-1191 OT Individual Time Calculation (min): 5 min  and Today's Date: 09/20/2022 OT Missed Time: 20 Minutes Missed Time Reason:  (Pt eating lunch.)  Short Term Goals: Week 2:  OT Short Term Goal 1 (Week 2): Pt will perform UB ADLs with Setup A. OT Short Term Goal 2 (Week 2): Pt will perform LB ADLs with Mod A + LRAD. OT Short Term Goal 3 (Week 2): Pt will performs squat pivot transfers with consistent Mod A.  Skilled Therapeutic Interventions/Progress Updates:   Session 1: Pt received resting in bed for skilled OT session with focus on bed-level FM activities and general strengthening. Pt reported 8/10 pain in bilateral feet, declining OOB activity as a result. OT offering intermediate rest breaks and positioning suggestions throughout session to address pain/fatigue and maximize participation/safety in session.   At bed-level, pt instructed in FM "put in" task with use of R-hand, targeting tripod grasp and active MCP flexion of 2nd digit. Pt performs x8 repetitions with rest break needed.   Pt then instructed in series of UE strengthening exercises, including:  -Chest press -Overhead press -Bicep curls -Wrist curls  Pt completes 1 set/10 reps of each exercise with 2# dowel bar, dycem and HOH assistance provided for R-hand grip.   Pt falling asleep towards end of session, experiencing greater difficulty staying alert to participate in session, missing ~15 mins of skilled intervention.   Pt remained resting in bed with all immediate needs met at end of session. Pt continues to be appropriate for skilled OT intervention to promote  further functional independence.   Session 2: Pt received finishing lunch, requesting time to continue eating. Pt provided with the materials and instructed in simple [facility] activity to complete outside of therapy. Pt missing ~20 mins of skilled intervention.   Therapy Documentation Precautions:  Precautions Precautions: Fall, Other (comment) Precaution Comments: L JP drain, R colostomy/ileostomy, abdominal wound, R foot/ankle wound Required Braces or Orthoses: Other Brace Other Brace: B LE PRAFOs Restrictions Weight Bearing Restrictions: Yes RUE Weight Bearing: Weight bear through elbow only RLE Weight Bearing: Weight bearing as tolerated LLE Weight Bearing: Weight bearing as tolerated Other Position/Activity Restrictions: RUE WBAT thru elbow only, no lifting > 5 lbs; no ROM restrictions   Therapy/Group: Individual Therapy  Lou Cal, OTR/L, MSOT  09/20/2022, 6:13 AM

## 2022-09-20 NOTE — Progress Notes (Addendum)
Patient lethargic at start of shift, but easily aroused with conversation. PT informed this nurse she was not able to do session this morning due to lethargy, obvious difference from yesterday. Vital signs within patient's normal range. Magnesium IV given, within one hour patient was more at baseline. Patient no longer drifting off to sleep. Patient up eating lunch at this time.Informed PT patient is back to baseline.

## 2022-09-21 LAB — MAGNESIUM: Magnesium: 1.4 mg/dL — ABNORMAL LOW (ref 1.7–2.4)

## 2022-09-21 LAB — BASIC METABOLIC PANEL
Anion gap: 7 (ref 5–15)
BUN: 16 mg/dL (ref 6–20)
CO2: 23 mmol/L (ref 22–32)
Calcium: 9 mg/dL (ref 8.9–10.3)
Chloride: 104 mmol/L (ref 98–111)
Creatinine, Ser: 0.58 mg/dL — ABNORMAL LOW (ref 0.61–1.24)
GFR, Estimated: >60 mL/min (ref >60)
Glucose, Bld: 89 mg/dL (ref 70–99)
Potassium: 4.3 mmol/L (ref 3.5–5.1)
Sodium: 134 mmol/L — ABNORMAL LOW (ref 135–145)

## 2022-09-21 NOTE — Progress Notes (Signed)
Occupational Therapy Weekly Progress Note  Patient Details  Name: Shane Klein MRN: [id number] Date of Birth: 1990/06/09  Beginning of progress report period: Sep 11, 2022 End of progress report period: Sep 21, 2022  Today's Date: 09/21/2022 OT Individual Time: 2841-3244 OT Individual Time Calculation (min): 70 min    Patient has met 3 of 3 short term goals. Pt currently requires Setup A for UB ADLs, Mod A for LB ADLs, performing functional squat pivot transfers with Mod A. Pt's mother present intermediately throughout, but official caregiver education to be scheduled for week of 6/3.   Patient continues to demonstrate the following deficits: muscle weakness, decreased cardiorespiratoy endurance, impaired timing and sequencing, unbalanced muscle activation, and decreased coordination, and decreased standing balance, decreased postural control, and decreased balance strategies and therefore will continue to benefit from skilled OT intervention to enhance overall performance with BADL and Reduce care partner burden.  Patient progressing toward long term goals..  Continue plan of care.  OT Short Term Goals Week 2:  OT Short Term Goal 1 (Week 2): Pt will perform UB ADLs with Setup A. OT Short Term Goal 1 - Progress (Week 2): Met OT Short Term Goal 2 (Week 2): Pt will perform LB ADLs with Mod A + LRAD. OT Short Term Goal 2 - Progress (Week 2): Met OT Short Term Goal 3 (Week 2): Pt will performs squat pivot transfers with consistent Mod A. OT Short Term Goal 3 - Progress (Week 2): Met Week 3:  OT Short Term Goal 1 (Week 3): STGS=LTGs due to patient's length of stay.  Skilled Therapeutic Interventions/Progress Updates:  Pt received resting in bed for skilled OT session with focus on functional transfers and R-hand ROM. Pt agreeable to interventions, despite demonstrating somewhat irritable mood. Pt with un-rated pain in B-feet (L>R). OT offering intermediate rest breaks and positioning  suggestions throughout session to address pain/fatigue and maximize participation/safety in session.   Pt comes to EOB completely flat with increased time, use use of bed rails, and overall supervision. Seated EOB, pt expresses strong discomfort with L-TED hose, dependent for doffing. Pt then performs STS with Mod A + modified RW, in preparation for stand-pivot. Pt unable to complete transfer due to increased sensitivity in LLE. Squat-pivot with Max A from EOB>TIS WC then performed. Seated in TIS WC at sink-side, pt completes oral care and face washing with item-retrieval and encouragement to integrate R-hand.  Pt then dependently transported to ADL apartment for time management. In ADL apartment, pt performs TTB transfer with heavy Mod A + use of grab bar. Pt and OT discuss home bathroom setup, with emphasis placed on the benefits of installing grab bars and a long-handled shower head.   Back in patient's room, pt instructed in gentle PIP and DIP stretches for 1st/2nd digits for carryover outside of therapy.   Pt remained sitting in TIS WC with all immediate needs met at end of session. Pt continues to be appropriate for skilled OT intervention to promote further functional independence.   Therapy Documentation Precautions:  Precautions Precautions: Fall, Other (comment) Precaution Comments: L JP drain, R colostomy/ileostomy, abdominal wound, R foot/ankle wound Required Braces or Orthoses: Other Brace Other Brace: B LE PRAFOs Restrictions Weight Bearing Restrictions: Yes RUE Weight Bearing: Weight bear through elbow only RLE Weight Bearing: Weight bearing as tolerated LLE Weight Bearing: Weight bearing as tolerated Other Position/Activity Restrictions: RUE WBAT thru elbow only, no lifting > 5 lbs; no ROM restrictions   Therapy/Group: Individual Therapy  Lou Cal, OTR/L, MSOT  09/21/2022, 6:12 AM

## 2022-09-21 NOTE — Progress Notes (Signed)
Physical Therapy Session Note  Patient Details  Name: Shane Klein MRN: [id number] Date of Birth: [birth year]  Today's Date: 09/21/2022 PT Individual Time: 1030-1127, 1330-1500 PT Individual Time Calculation (min): 57 min, 90 min    Short Term Goals: Week 3:  PT Short Term Goal 1 (Week 3): Pt will require min A for w/c<>mat transfer PT Short Term Goal 2 (Week 3): Pt will require min A for STS PT Short Term Goal 3 (Week 3): Pt will require mod A x 1 for gait x 10 ft with LRAD  Skilled Therapeutic Interventions/Progress Updates:      Therapy Documentation Precautions:  Precautions Precautions: Fall, Other (comment) Precaution Comments: L JP drain, R colostomy/ileostomy, abdominal wound, R foot/ankle wound Required Braces or Orthoses: Other Brace Other Brace: B LE PRAFOs Restrictions Weight Bearing Restrictions: Yes RUE Weight Bearing: Weight bear through elbow only RLE Weight Bearing: Weight bearing as tolerated LLE Weight Bearing: Weight bearing as tolerated Other Position/Activity Restrictions: RUE WBAT thru elbow only, no lifting > 5 lbs; no ROM restrictions  Treatment Session 1:   Pt received seated in TIS at bedside with unrated L LE foot pain described as soreness and achy. PT performed compression ace wrapping to left foot and pt reports improvements in symptoms.   PT present to address caregiver (pt's mother) concerning discharge. Pt's mom interested in facility placement following rehabilitation stay. PT educated pt and his mother on recommendation to discharge home and continue with therapies at Outpatient level. PT encouraged pt's mother to start family training next week to prepare for discharge.   Pt's mother provided pt with size 12 men's shoe to trial bilateral AFO's. PT obtained bilateral PLS AFO and dependently donned socks for time management. PT able to don R AFO, but unable to put on L AFO due to heightened pain.   Plan to trial L AFO placement in upcoming  PM session if patient able to tolerate due to pain.   Pt left seated in TIS w/c at bedside with all needs in reach.    Treatment Session 2:   Pt received seated in TIS w/c at bedside and reports improvement in left foot pain and willing to trial AFO placement. PT required increased time to don bilateral AFO's in shoes due to bilateral foot drop.   Pt transported in TIS to dayroom to meet Adam, [facility] from [facility] for wheelchair adjustments. Pt with difficulty performing transfers with rigid foot plate and PT inquired if pt can receive removable leg rests. ATP adjusted dump to improve alignment to better transfers.   Pt mod A with stand pivot transfer with R platform walker to ultra lightweight chair. Pt with urinary urge and dependently transferred to public restroom and provided with urinal, pt unable to void.   Pt transitioned to gait training and ambulated mod A x 1 ~12 feet with R platform RW and bilateral AFO's. Pt reports improved efficiency and decreased fatigue with use of AFO's.   Pt transported to room dependently and requires min A x 1 with stand pivot with R platform walker to bed. Pt able to steer RW without assistance. Pt min A for sit to lying and left semi-reclined in bed with nurse present.   Therapy/Group: Individual Therapy  Truitt Leep Truitt Leep PT, DPT  09/21/2022, 7:37 AM

## 2022-09-21 NOTE — Progress Notes (Addendum)
Patient ID: Shane Klein, male   DOB: 08-14-1990, 32 y.o.   MRN: [id number]  Met with pt and mom who is here regarding team conference progress toward his goals and target discharge still set for 6/11.  Mom will begin hands on education with therapies when here. She has already started with nursing and ileostomy education. Informed  both can not get home health agency due to safety issues. Discussed OP therapies and  will do [employer] application for him so can get transport. He is closest to [employer] for OP. Mom and step dad will come in for education and will begin early so feel comfortable with pt's care needs at discharge, since will not be able to get a home health RN at discharge due to home health situation. Continue to work on discharge needs.
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
## 2022-09-22 LAB — MAGNESIUM: Magnesium: 1.6 mg/dL — ABNORMAL LOW (ref 1.7–2.4)

## 2022-09-22 MED ORDER — HYDROCERIN EX CREA
TOPICAL_CREAM | Freq: Two times a day (BID) | CUTANEOUS | Status: DC
  Filled 2022-09-22: qty 113

## 2022-09-22 MED ORDER — GABAPENTIN 400 MG PO CAPS
1200.0000 mg | ORAL_CAPSULE | Freq: Three times a day (TID) | ORAL | Status: DC
  Administered 2022-09-22 – 2022-10-05 (×39): 1200 mg via ORAL
  Filled 2022-09-22 (×39): qty 3

## 2022-09-22 MED ORDER — ORAL CARE MOUTH RINSE: 15.0000 mL | OROMUCOSAL | Status: DC | PRN

## 2022-09-22 NOTE — Plan of Care (Signed)
Wound Plan   Braden Score: 17  Sensory: 3  Moisture: 4  Activity: 2  Mobility: 3  Nutrition: 3  Friction: 2   Wounds present:   Pressure Injury 09/02/22 to Right Lateral Ankle- Stage II (POA) Pressure Injury 09/02/22 to Left Posterior Tibial-Stage II (POA)  Hypergranulation-tissue protrudes above skin surface Abdominal Incision with retention sutures  Interventions:    Vitamin C WOC for new hypergranulation Vitamin D3 Vitamin B12 Eucerin Cream Multivitamin  Juven- 1 packet BID Elevate heels off of bed Maintain bilateral PRAFO boots Kreg bed   Wound care for midline incision: Cleanse with NS, pat gently dry. Fill defects between retention sutures with saline moistened roll gauze, top with dry gauze, apply ABD pads and secure with tape. Change PRN for soiling, otherwise change daily.   WOC pending for new orders to hypergranulation tissue.  Attendees/ Contributors:   Vedia Pereyra, RN Keturah Barre, RN
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
## 2022-09-22 NOTE — Progress Notes (Signed)
Placed wound care plan today on patient as most pressure injuries had almost healed when admitted.  Today the stage II was viewed and noted was hypergranulation of tissue. WOC order placed.

## 2022-09-22 NOTE — Progress Notes (Signed)
Occupational Therapy Session Note  Patient Details  Name: Shane Klein MRN: [id number] Date of Birth: 12-Jun-1990  Today's Date: 09/22/2022 OT Individual Time: 0910-1020 OT Individual Time Calculation (min): 70 min    Short Term Goals: Week 3:  OT Short Term Goal 1 (Week 3): STGS=LTGs due to patient's length of stay.  Skilled Therapeutic Interventions/Progress Updates:  Pt received resting in bed for skilled OT session with focus on BADL retraining at sink-sidep. Pt agreeable to interventions, despite demonstrating overall irritable mood. Pt reported 6/10 pain in B-feet. OT offering intermediate rest breaks and positioning suggestions throughout session to address pain/fatigue and maximize participation/safety in session.  Pt comes to EOB with supervision + use of bed rail. Pt performs stand-pivot transfer from EOB>TIS WC with Min A. Sitting in WC at sink-side, pt completes UB bathing/dressing with wash-cloth retrieval. Pt performs STS transfer with Min A + RW to dependently doff/don pants over bottom/hips and complete posterior peri-area cleaning. Pt returns to sitting to clean BLEs and anterior peri-area, requiring no more than supervision for completion of task and functional reach. Pt dependent for threading of pants, standing with similar level of assistance to dependently don over bottom/hips.  Time spent retrieving/placing half-lap tray to elevate RUE.   Pt remained sitting in TIS WC with RN present and all immediate needs met at end of session. Pt continues to be appropriate for skilled OT intervention to promote further functional independence.   Therapy Documentation Precautions:  Precautions Precautions: Fall, Other (comment) Precaution Comments: L JP drain, R colostomy/ileostomy, abdominal wound, R foot/ankle wound Required Braces or Orthoses: Other Brace Other Brace: B LE PRAFOs Restrictions Weight Bearing Restrictions: Yes RUE Weight Bearing: Weight bear through elbow  only RLE Weight Bearing: Weight bearing as tolerated LLE Weight Bearing: Weight bearing as tolerated Other Position/Activity Restrictions: RUE WBAT thru elbow only, no lifting > 5 lbs; no ROM restrictions   Therapy/Group: Individual Therapy  Lou Cal, OTR/L, MSOT  09/22/2022, 6:47 AM

## 2022-09-22 NOTE — Progress Notes (Signed)
Physical Therapy Session Note  Patient Details  Name: UDELL RHUE MRN: [id number] Date of Birth: [birth year]  Today's Date: 09/22/2022 PT Individual Time: 1045-1206, 1430-1530  PT Individual Time Calculation (min): 81 min , 60 min   Short Term Goals: Week 3:  PT Short Term Goal 1 (Week 3): Pt will require min A for w/c<>mat transfer PT Short Term Goal 2 (Week 3): Pt will require min A for STS PT Short Term Goal 3 (Week 3): Pt will require mod A x 1 for gait x 10 ft with LRAD  Skilled Therapeutic Interventions/Progress Updates:      Therapy Documentation Precautions:  Precautions Precautions: Fall, Other (comment) Precaution Comments: L JP drain, R colostomy/ileostomy, abdominal wound, R foot/ankle wound Required Braces or Orthoses: Other Brace Other Brace: B LE PRAFOs Restrictions Weight Bearing Restrictions: Yes RUE Weight Bearing: Weight bear through elbow only RLE Weight Bearing: Weight bearing as tolerated LLE Weight Bearing: Weight bearing as tolerated Other Position/Activity Restrictions: RUE WBAT thru elbow only, no lifting > 5 lbs; no ROM restrictions  Treatment Session 1:   Pt received seated in TIS w/c at bedside and agreeable to PT session. Pt with unrated foot pain and was provided repositioning and rest for relief.   PT donned socks and bilateral AFO's for mobility training. Pt transported by w/c for time management to ortho gym to practice car transfers. Pt requires min A with ambulatory transfer with R platform RW to car and able to independently mobilize LE's into car.  Pt ambulated to w/c and transported to main gym for time management.   Pt requires max A x 2 with navigation of single 3 inch step with L hand rail with max verbal cues for sequencing and assist for left foot clearance.   Pt transported to room and left seated in TIS at bedside with all needs in reach.   Treatment Session 2:   Pt received seated in TIS and agreeable to PT session. PT  engaged in discussion with patient and recreational therapy on mindset and coping and PT utilized therapeutic use of self.   Pt transported by w/c dependently to main gym hallway and participated in gait training. Pt requires min A for sit to stand and ambulation of 50 ft with R platform RW with min verbal cues for left weight shift. Pt also requires visual feedback from mirror for upright posture.   Pt transported to parallel bars and attempted lateral weight shifting activities but reports urge to void. Pt transported to room and performed stand pivot min A with R platform RW.   Pt left semi-reclined in bed and all needs in reach.    Therapy/Group: Individual Therapy  Truitt Leep Truitt Leep PT, DPT  09/22/2022, 7:31 AM

## 2022-09-22 NOTE — Progress Notes (Signed)
Nutrition Follow-up  DOCUMENTATION CODES:   Non-severe (moderate) malnutrition in context of chronic illness  INTERVENTION:  - Discontinue Boost Breeze po TID, each supplement provides 250 kcal and 9 grams of protein   - Continue -1 packet Juven BID, each packet provides 95 calories, 2.5 grams of protein (collagen), and 9.8 grams of carbohydrate (3 grams sugar); also contains 7 grams of L-arginine and L-glutamine, 300 mg vitamin C, 15 mg vitamin E, 1.2 mcg vitamin B-12, 9.5 mg zinc, 200 mg calcium, and 1.5 g  Calcium Beta-hydroxy-Beta-methylbutyrate to support wound healing  NUTRITION DIAGNOSIS:   Moderate Malnutrition related to chronic illness as evidenced by moderate muscle depletion, moderate fat depletion.  GOAL:   Patient will meet greater than or equal to 90% of their needs  MONITOR:   PO intake, Supplement acceptance, Skin  REASON FOR ASSESSMENT:   Malnutrition Screening Tool    ASSESSMENT:   32 y.o. male admits to CIR related to functional deficits secondary to spinal cord injury due to fragments in the spinal canal at T12 and L2. PMH includes: acute radial nerve palsy, right supracondylar humerus fracture.  Meds reviewed: Vit C, Vit D, Vit B12, lomotil, ferrous sulfate, imodium, mag-ox, MVI, Juven.   The pt continues with good PO intakes. Per record, pt has mostly eaten 100% of his meals over the past 7 days. The pt is meeting his needs at this time. Pt has Boost Breeze ordered but not accepting supplements. RD will discontinue. RD will continue to monitor PO intakes.   Diet Order:   Diet Order             Diet regular Room service appropriate? Yes; Fluid consistency: Thin  Diet effective now                   EDUCATION NEEDS:   Not appropriate for education at this time  Skin:  Skin Integrity Issues:: Stage II, Stage III DTI: R arm Stage II: R ankle; left tibial; Stage III: Mid buttocks Incisions: back; abdomen; left hip Other: R wrist  Last BM:   5/29 - type 6  Height:   Ht Readings from Last 1 Encounters:  09/03/22 5\' 10"  (1.778 m)    Weight:   Wt Readings from Last 1 Encounters:  09/21/22 59.3 kg    Ideal Body Weight:     BMI:  Body mass index is 18.76 kg/m.  Estimated Nutritional Needs:   Kcal:  2200-2500 kcals  Protein:  110-130 gm  Fluid:  >/= 2 L/day  [name], RD, LDN, CNSC.

## 2022-09-23 ENCOUNTER — Inpatient Hospital Stay (HOSPITAL_COMMUNITY): Payer: Medicaid Other

## 2022-09-23 LAB — CBC WITH DIFFERENTIAL/PLATELET
Abs Immature Granulocytes: 0.02 10*3/uL (ref 0.00–0.07)
Abs Immature Granulocytes: 0.02 10*3/uL (ref 0.00–0.07)
Basophils Absolute: 0 10*3/uL (ref 0.0–0.1)
Basophils Absolute: 0 10*3/uL (ref 0.0–0.1)
Basophils Relative: 0 %
Basophils Relative: 1 %
Eosinophils Absolute: 0.3 10*3/uL (ref 0.0–0.5)
Eosinophils Absolute: 0.2 10*3/uL (ref 0.0–0.5)
Eosinophils Relative: 5 %
Eosinophils Relative: 3 %
HCT: 22.6 % — ABNORMAL LOW (ref 39.0–52.0)
HCT: 26 % — ABNORMAL LOW (ref 39.0–52.0)
Hemoglobin: 7.6 g/dL — ABNORMAL LOW (ref 13.0–17.0)
Hemoglobin: 8.3 g/dL — ABNORMAL LOW (ref 13.0–17.0)
Immature Granulocytes: 0 %
Immature Granulocytes: 0 %
Lymphocytes Relative: 15 %
Lymphocytes Relative: 10 %
Lymphs Abs: 1.1 10*3/uL (ref 0.7–4.0)
Lymphs Abs: 0.6 10*3/uL — ABNORMAL LOW (ref 0.7–4.0)
MCH: 27.1 pg (ref 26.0–34.0)
MCH: 25.7 pg — ABNORMAL LOW (ref 26.0–34.0)
MCHC: 33.6 g/dL (ref 30.0–36.0)
MCHC: 31.9 g/dL (ref 30.0–36.0)
MCV: 80.7 fL (ref 80.0–100.0)
MCV: 80.5 fL (ref 80.0–100.0)
Monocytes Absolute: 0.7 10*3/uL (ref 0.1–1.0)
Monocytes Absolute: 0.4 10*3/uL (ref 0.1–1.0)
Monocytes Relative: 9 %
Monocytes Relative: 7 %
Neutro Abs: 5.2 10*3/uL (ref 1.7–7.7)
Neutro Abs: 4.7 10*3/uL (ref 1.7–7.7)
Neutrophils Relative %: 71 %
Neutrophils Relative %: 79 %
Platelets: 451 10*3/uL — ABNORMAL HIGH (ref 150–400)
Platelets: 510 10*3/uL — ABNORMAL HIGH (ref 150–400)
RBC: 2.8 MIL/uL — ABNORMAL LOW (ref 4.22–5.81)
RBC: 3.23 MIL/uL — ABNORMAL LOW (ref 4.22–5.81)
RDW: 16.1 % — ABNORMAL HIGH (ref 11.5–15.5)
RDW: 16.2 % — ABNORMAL HIGH (ref 11.5–15.5)
WBC: 7.5 10*3/uL (ref 4.0–10.5)
WBC: 5.9 10*3/uL (ref 4.0–10.5)
nRBC: 0 % (ref 0.0–0.2)
nRBC: 0 % (ref 0.0–0.2)

## 2022-09-23 LAB — COMPREHENSIVE METABOLIC PANEL
ALT: 126 U/L — ABNORMAL HIGH (ref 0–44)
AST: 70 U/L — ABNORMAL HIGH (ref 15–41)
Albumin: 2 g/dL — ABNORMAL LOW (ref 3.5–5.0)
Alkaline Phosphatase: 115 U/L (ref 38–126)
Anion gap: 8 (ref 5–15)
BUN: 12 mg/dL (ref 6–20)
CO2: 23 mmol/L (ref 22–32)
Calcium: 9.3 mg/dL (ref 8.9–10.3)
Chloride: 102 mmol/L (ref 98–111)
Creatinine, Ser: 0.58 mg/dL — ABNORMAL LOW (ref 0.61–1.24)
GFR, Estimated: >60 mL/min (ref >60)
Glucose, Bld: 93 mg/dL (ref 70–99)
Potassium: 5.1 mmol/L (ref 3.5–5.1)
Sodium: 133 mmol/L — ABNORMAL LOW (ref 135–145)
Total Bilirubin: 0.8 mg/dL (ref 0.3–1.2)
Total Protein: 6.4 g/dL — ABNORMAL LOW (ref 6.5–8.1)

## 2022-09-23 LAB — MAGNESIUM: Magnesium: 1.5 mg/dL — ABNORMAL LOW (ref 1.7–2.4)

## 2022-09-23 LAB — LACTIC ACID, PLASMA: Lactic Acid, Venous: 1.2 mmol/L (ref 0.5–1.9)

## 2022-09-23 LAB — PROCALCITONIN: Procalcitonin: 0.27 ng/mL

## 2022-09-23 MED ORDER — TRIAMCINOLONE ACETONIDE 0.5 % EX CREA
TOPICAL_CREAM | Freq: Two times a day (BID) | CUTANEOUS | Status: DC
  Filled 2022-09-23 (×2): qty 15

## 2022-09-23 MED ORDER — VANCOMYCIN HCL IN DEXTROSE 1-5 GM/200ML-% IV SOLN
1000.0000 mg | Freq: Two times a day (BID) | INTRAVENOUS | Status: DC
  Administered 2022-09-24 – 2022-09-27 (×7): 1000 mg via INTRAVENOUS
  Filled 2022-09-23 (×9): qty 200

## 2022-09-23 MED ORDER — SODIUM CHLORIDE 0.9 % IV SOLN: INTRAVENOUS | Status: DC

## 2022-09-23 MED ORDER — MAGNESIUM OXIDE -MG SUPPLEMENT 400 (240 MG) MG PO TABS
800.0000 mg | ORAL_TABLET | Freq: Two times a day (BID) | ORAL | Status: DC
  Administered 2022-09-23: 800 mg via ORAL
  Filled 2022-09-23: qty 2

## 2022-09-23 MED ORDER — VANCOMYCIN HCL 1250 MG/250ML IV SOLN
1250.0000 mg | Freq: Two times a day (BID) | INTRAVENOUS | Status: DC
  Filled 2022-09-23: qty 250

## 2022-09-23 MED ORDER — MAGNESIUM SULFATE 4 GM/100ML IV SOLN
4.0000 g | Freq: Once | INTRAVENOUS | Status: AC
  Administered 2022-09-23: 4 g via INTRAVENOUS
  Filled 2022-09-23: qty 100

## 2022-09-23 MED ORDER — PIPERACILLIN-TAZOBACTAM 3.375 G IVPB
3.3750 g | Freq: Three times a day (TID) | INTRAVENOUS | Status: DC
  Administered 2022-09-23 – 2022-09-27 (×12): 3.375 g via INTRAVENOUS
  Filled 2022-09-23 (×13): qty 50

## 2022-09-23 NOTE — Consult Note (Addendum)
WOC Nurse ostomy follow up Stoma type/location: RLQ ileostomy, soft yellow stool Stomal assessment/size: 1 3/8" round budded and pink.  Productive Peristomal assessment: intact  midline incision, so barrier is cut off center to accommodate  Treatment options for stomal/peristomal skin: barrier ring.  No powder or skin prep used on intact peristomal skin Output soft yellow stool Ostomy pouching: 2pc. With barrier ring Education provided: We measure stoma and mother cuts barrier to fit.  She assembles pouch and barrier.  Stoma with near constant output but mother able to dab and clean and apply barrier ring with minimal assistance from this Clinical research associate.  Applies pouch and barrier and smoothes to seal adhesive backing .  She is able to roll pouch closed.  She is feeling more confident in pouch application.  She is concerned about constant output.  I inform her first thing in the AM before a meal may be an ideal time to perform.  We discuss pouch changes 2-3 times weekly and anytime you suspect a leak.  She still needs to observe and return demonstrate wound care.  I mention to  bedside nurse, Dahlia Client.  This has been done today already, but mother will mention to nursing staff this need to learn this skill.  We agree to meet again Monday 6/3 at 9:30 for another pouch change. I have sent secure chat to staff to keep therapy schedule clear at that time.  Supplies in room.  Enrolled patient in [place] Secure Start Discharge program: Yes Will follow.  Mike Gip MSN, RN, FNP-BC CWON Wound, Ostomy, Continence Nurse Outpatient [facility] [phone] Pager [phone]

## 2022-09-23 NOTE — Progress Notes (Signed)
Occupational Therapy Session Note  Patient Details  Name: Shane Klein MRN: [id number] Date of Birth: 13-Jun-1990  Today's Date: 09/23/2022 OT Individual Time: 0935-1100 OT Individual Time Calculation (min): 85 min   Short Term Goals: Week 3:  OT Short Term Goal 1 (Week 3): STGS=LTGs due to patient's length of stay.  Skilled Therapeutic Interventions/Progress Updates:  Pt received resting in bed for skilled OT session with focus on BADL retraining. Pt agreeable to interventions, despite demonstrating overall fatigue and irritable mood. Pt with un-rated pain in B-feet. OT offering intermediate rest breaks and positioning suggestions throughout session to address pain/fatigue and maximize participation/safety in session.   Pt comes to EOB with supervision + use of bed rail. Sitting EOB, pt's wounds waterproofed, socks dependently donned. Pt ambulates from EOB>padded TTB with Min A + RW, requiring assistance to advance LLE with fatigue and manage threshold. Pt with increased pain in bilat-feet, but able to tolerate gentle washing/massaging for pain management during shower. Pt requires overall Min A for full-body bathing, especially for bottom/B lower legs. Stedy +1 for transfer back to bed.   Pt with direct handoff to PT supine in bed, all immediate needs met at end of session. Pt continues to be appropriate for skilled OT intervention to promote further functional independence.   Therapy Documentation Precautions:  Precautions Precautions: Fall, Other (comment) Precaution Comments: L JP drain, R colostomy/ileostomy, abdominal wound, R foot/ankle wound Required Braces or Orthoses: Other Brace Other Brace: B LE PRAFOs Restrictions Weight Bearing Restrictions: Yes RUE Weight Bearing: Weight bear through elbow only RLE Weight Bearing: Weight bearing as tolerated LLE Weight Bearing: Weight bearing as tolerated Other Position/Activity Restrictions: RUE WBAT thru elbow only, no lifting > 5  lbs; no ROM restrictions   Therapy/Group: Individual Therapy  Lou Cal, OTR/L, MSOT  09/23/2022, 12:49 PM

## 2022-09-23 NOTE — Progress Notes (Signed)
Patient reported to be febrile with T- 101.8 F with HR 104. Chart reviewed and sepsis work up initiated. Blood cultures, drain fluid culture ordered. Discussed with pharmacy to review prior antibiotics--Zosyn and Vanc to start once labs drawn. May need CT abdomen/pelvis repeated.

## 2022-09-23 NOTE — Progress Notes (Addendum)
Pharmacy Antibiotic Note  Shane Klein is a 32 y.o. male admitted on 09/03/2022 with rule out sepsis.  Pharmacy has been consulted for Vancomycin and Zosyn dosing. RN instructed not to give antibiotics until blood cultures drawn per provider request. SCr stable <1.  Plan: Vancomycin 1g IV q12h. Goal AUC 400-550. Expected AUC: 480 SCr used: 0.8 Zosyn 3.375 grams IV Q 8 hours - 4 hr infusion Monitor clinical progress, c/s, renal function F/u de-escalation plan/LOT, vancomycin levels as indicated   Height: 5\' 10"  (177.8 cm) Weight: 59.3 kg (130 lb 11.7 oz) IBW/kg (Calculated) : 73  Temp (24hrs), Avg:99.4 F (37.4 C), Min:98.1 F (36.7 C), Max:101.8 F (38.8 C)  Recent Labs  Lab 09/20/22 0605 09/21/22 0530 09/23/22 0545  WBC 6.2  --  7.5  CREATININE 0.61 0.58* 0.58*    Estimated Creatinine Clearance: 112.2 mL/min (A) (by C-G formula based on SCr of 0.58 mg/dL (L)).    Allergies  Allergen Reactions   Lactose Intolerance (Gi)     Pt advised RN he is lactose intolerant.     Shane Klein, PharmD, BCPS Please check AMION for all [facility] contact numbers Clinical Pharmacist 09/23/2022 10:18 PM

## 2022-09-23 NOTE — Progress Notes (Signed)
Recreational Therapy Session Note  Patient Details  Name: Shane Klein MRN: [id number] Date of Birth: [birth year] Today's Date: 09/23/2022  Pain: no c/o Skilled Therapeutic Interventions/Progress Updates:  Goal: CTRS will provide and educate pt on community resources. MET  Session focused on discharge planning including identification of AE needs, recreation/leisure resources, sexuality/intimacy and adaptive driving.  Pt appreciative of this information.  Handouts provided.  Therapy/Group: Co-Treatment   Dionne Knoop 09/23/2022, 4:13 PM
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
## 2022-09-23 NOTE — Progress Notes (Signed)
Recreational Therapy Session Note  Patient Details  Name: AASIR DELACERDA MRN: [id number] Date of Birth: [birth year] Today's Date: 09/23/2022  Pt refused scheduled 60 minute group session.Marland Kitchen  Gahel Safley 09/23/2022, 12:46 PM
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
## 2022-09-23 NOTE — Group Note (Signed)
Patient Details Name: Shane Klein MRN: [id number] DOB: 11-Dec-1990 Today's Date: 09/23/2022  OT Missed Time Calculation: 60 min    Attempted to see Pt for therapeutic group dance session. Pt unreceptive to participating and politely refusing d/t psychosocial factors causing stress and reported he was not motivated to participate at this time. Provided maximal therapeutic support and motivation, however Pt unreceptive to participate. Missed 60 minutes of skilled treatment time d/t Pt refusal.       Group Description: Dance Group: Pt participated in dance group with an emphasis on social interaction, motor planning, increasing overall activity tolerance and bimanual tasks. All songs were selected by group members. Dance moves included AROM of BUE/BLE gross motor movements with an emphasis on building functional endurance.    Pain: Pain Assessment Pain Score: 3     Phylis Javed A Hatcher Froning 09/23/2022, 5:10 PM

## 2022-09-23 NOTE — Progress Notes (Signed)
Physical Therapy Session Note  Patient Details  Name: Shane Klein MRN: [id number] Date of Birth: [birth year]  Today's Date: 09/23/2022 PT Individual Time: 1101-1225, [id number] PT Individual Time Calculation (min): 84 min , 62 min   Short Term Goals: Week 3:  PT Short Term Goal 1 (Week 3): Pt will require min A for w/c<>mat transfer PT Short Term Goal 2 (Week 3): Pt will require min A for STS PT Short Term Goal 3 (Week 3): Pt will require mod A x 1 for gait x 10 ft with LRAD  Skilled Therapeutic Interventions/Progress Updates:      Therapy Documentation Precautions:  Precautions Precautions: Fall, Other (comment) Precaution Comments: L JP drain, R colostomy/ileostomy, abdominal wound, R foot/ankle wound Required Braces or Orthoses: Other Brace Other Brace: B LE PRAFOs Restrictions Weight Bearing Restrictions: Yes RUE Weight Bearing: Weight bear through elbow only RLE Weight Bearing: Weight bearing as tolerated LLE Weight Bearing: Weight bearing as tolerated Other Position/Activity Restrictions: RUE WBAT thru elbow only, no lifting > 5 lbs; no ROM restrictions   Treatment Session 1:   Pt received semi-reclined in bed with OT, PT present for handoff. Pt with unrated bilateral LE foot pain, provided rest and repositioning for relief.   Pt nurse present to re-dress wounds following patient's shower with OT. Pt requires mod/max A for upper/lower body dressing and total A for donning of AFO/shoes.   Fleet Contras present from [facility] and brought new loaner wheelchair. Pt (S) for safety with supine to sit and requires CGA with sit to stand and pivot with R platform RW to ultralight weight w/c with following spec's:  Quickie 2 16x18  Jay 3 Back Active  Roho High Profile  Ht Adj Arms  Anti Tippers    Pt propelled w/c ~200 ft with close (S) for safety to main gym, plan to switch out wheels to larger size. Pt able to perform sit to stand transfer from new loaner w/c and  excited about his increase in mobility. Pt CGA with sit<>stand. Pt transported to room and left seated in w/c at bedside with all needs in reach.   Treatment Session 2:   Pt received seated in loaner w/c and agreeable to PT session with recreational therapist regarding discharge planning and education. PT educated pt regarding recommendation of bed rail to assist with bed mobility. Pt engaged in conversation regarding pressure relief, skin integrity, bladder program, UTI prevention, Flexibility Program, assistive technology with driving, sexual health and adaptive and inclusive recreational activities (basketball). Pt transported dependently for time management by w/c and conversation held in ortho gym. Pt requires min A with stand pivot to bed with R PFRW and with sit to lying and left semi-reclined in bed with all needs in reach. PT provided educational handouts to reflect previous conversation.   Therapy/Group: Individual Therapy  Truitt Leep Truitt Leep PT, DPT  09/23/2022, 7:40 AM

## 2022-09-24 ENCOUNTER — Inpatient Hospital Stay (HOSPITAL_COMMUNITY): Payer: Medicaid Other

## 2022-09-24 LAB — BASIC METABOLIC PANEL
Anion gap: 8 (ref 5–15)
BUN: 9 mg/dL (ref 6–20)
CO2: 23 mmol/L (ref 22–32)
Calcium: 9 mg/dL (ref 8.9–10.3)
Chloride: 101 mmol/L (ref 98–111)
Creatinine, Ser: 0.65 mg/dL (ref 0.61–1.24)
GFR, Estimated: >60 mL/min (ref >60)
Glucose, Bld: 86 mg/dL (ref 70–99)
Potassium: 3.9 mmol/L (ref 3.5–5.1)
Sodium: 132 mmol/L — ABNORMAL LOW (ref 135–145)

## 2022-09-24 LAB — AEROBIC/ANAEROBIC CULTURE W GRAM STAIN (SURGICAL/DEEP WOUND)

## 2022-09-24 LAB — CULTURE, BLOOD (ROUTINE X 2): Culture: NO GROWTH

## 2022-09-24 LAB — MAGNESIUM: Magnesium: 1.6 mg/dL — ABNORMAL LOW (ref 1.7–2.4)

## 2022-09-24 LAB — LACTIC ACID, PLASMA: Lactic Acid, Venous: 1.2 mmol/L (ref 0.5–1.9)

## 2022-09-24 MED ORDER — CHOLESTYRAMINE 4 G PO PACK
4.0000 g | PACK | Freq: Every day | ORAL | Status: DC
  Administered 2022-09-24: 4 g via ORAL
  Filled 2022-09-24 (×12): qty 1

## 2022-09-24 MED ORDER — IOHEXOL 9 MG/ML PO SOLN: 500.0000 mL | ORAL | Status: DC

## 2022-09-24 MED ORDER — IOHEXOL 350 MG/ML SOLN
75.0000 mL | Freq: Once | INTRAVENOUS | Status: AC | PRN
  Administered 2022-09-24: 75 mL via INTRAVENOUS

## 2022-09-24 MED ORDER — MAGNESIUM SULFATE 4 GM/100ML IV SOLN
4.0000 g | Freq: Every day | INTRAVENOUS | Status: AC
  Administered 2022-09-24 – 2022-09-26 (×3): 4 g via INTRAVENOUS
  Filled 2022-09-24 (×3): qty 100

## 2022-09-24 MED ORDER — IOHEXOL 9 MG/ML PO SOLN
1000.0000 mL | ORAL | Status: AC
  Administered 2022-09-24: 1000 mL via ORAL

## 2022-09-24 NOTE — Progress Notes (Signed)
Recreational Therapy Session Note  Patient Details  Name: JETER BERKEBILE MRN: [id number] Date of Birth: May 31, 1990 Today's Date: 09/24/2022  Pain: c/o muscle soreness in abdominal area with movement, premedicated Skilled Therapeutic Interventions/Progress Updates:  Goal:  Pt will direct care given min cues.  MET Session focused on pt directing his care.  Pt voices uncertainty in his ability to do this but with encouragement, pt was able to direct care including placement of shoes and AFOs sitting EOB, walking distance, asking medical questions/concerns to treatment team.  Also reviewed pressure relieving measures and how pt could direct staff with this task.  Reinforced that pt will be required to direct his care at discharge and to take advantage of the opportunities to practice with staff.  Pt stated understanding.  Pt ambulated with PFRW with contact guard assist 57'!  Sashay Felling 09/24/2022, 12:37 PM

## 2022-09-24 NOTE — Progress Notes (Signed)
Occupational Therapy Session Note  Patient Details  Name: Shane Klein MRN: [id number] Date of Birth: 1991/04/22  Today's Date: 09/24/2022 OT Individual Time: 0900-1000 1st Session, Missed 30 min 2nd Session  OT Individual Time Calculation (min): 60 min, missed 30 min 2nd session d/t CT scan    Short Term Goals: Week 3:  OT Short Term Goal 1 (Week 3): STGS=LTGs due to patient's length of stay.  Skilled Therapeutic Interventions/Progress Updates:    Session 1:  Nursing providing ostomy care upon OT arrival for session. Pt was febrile in the night and nursing taking samples to w/u. Pt reports feeling much better than last night and yesterday overall but reported B foot pain. Pt able to tolerate moist heat, gentle stretch, ROM and facilitation on R hand during care in prep for OT session and hand use/integration. Pt now with full PROM for hand grasp and release and 3/4 grasp and full release actively. Pt with ~ 20 degrees active wrist ext and full wrist flexion. Weakness for holding and manipulating items persists on R hand. Pt able to complete face washing with R hand with adaptive techniques. Deoderant application and manipulation with 75% R hand support when cues. Doffed soiled and donned pull over shirt with use of bed rails for trunk support to pull forward but with set up and increased time only. Oral care with 75% integration of R hand. OT positioned B LE's for skin protection with education and training for carryover between visits. Pt requested to remain bed level until later PT session. OT to address estim next session for R distal strength progression. Safety needs and nurse call button in reach.    Pain:  B heel pain fluctuating with movement 6/10 assisting with heel positioning to float off bed mattress with + relief down to 3/10 at end of session   Session 2:  Pt at CT scan upon OT arrival. Missed 30 min tx session and OT will attempt to make up session as able.      Therapy Documentation Precautions:  Precautions Precautions: Fall, Other (comment) Precaution Comments: L JP drain, R colostomy/ileostomy, abdominal wound, R foot/ankle wound Required Braces or Orthoses: Other Brace Other Brace: B LE PRAFOs Restrictions Weight Bearing Restrictions: Yes RUE Weight Bearing: Weight bear through elbow only RLE Weight Bearing: Weight bearing as tolerated LLE Weight Bearing: Weight bearing as tolerated Other Position/Activity Restrictions: RUE WBAT thru elbow only, no lifting > 5 lbs; no ROM restrictions    Therapy/Group: Individual Therapy  Vicenta Dunning 09/24/2022, 7:28 AM

## 2022-09-24 NOTE — Progress Notes (Signed)
Physical Therapy Session Note  Patient Details  Name: Shane Klein MRN: [id number] Date of Birth: 1991-03-04  Today's Date: 09/24/2022 PT Individual Time: 1118-1209 + 4098-1191 PT Individual Time Calculation (min): 51 min + 70 min  Short Term Goals: Week 3:  PT Short Term Goal 1 (Week 3): Pt will require min A for w/c<>mat transfer PT Short Term Goal 2 (Week 3): Pt will require min A for STS PT Short Term Goal 3 (Week 3): Pt will require mod A x 1 for gait x 10 ft with LRAD  Skilled Therapeutic Interventions/Progress Updates:     SESSION 1: Pt presents in room in bed, RN present, rec therapist present for cotreat for stress management and improved participation with therapies. Pt reporting increased pain in bilateral feet however agreeable to PT. RN reporting pt with CT scan today and is required to complete drink within time limit, pt encouraged to drink fluid during session. Session focused on transfer training, self advocacy for positioning and self care, and gait training with RUE PFRW.  Therapist dons socks total assist in supine, attempts to don AFO and shoes total assist however pt reporting increased pain. Pt completes supine to sit with supervision with hospital bed features (elevated HOB, hospital bed rails) able to mobilize BLEs off bed and trunk to upright. Pt able to scoot to EOB with supervision, attempt to don shoes with AFOs in shoe with pt reporting increased pain, reattempt by donning AFO first then donning shoe with increased ease and no increase in pain.  Pt completes stand pivot transfer with RUE PFRW CGA/min assist for immediate standing balance, pt able to manage PFRW without assist.  Pt transported dependently in [facility] from room to hallway outside of main gym for gait training. Pt completes sit to stand with min assist for anterior wt shift, verbal cues for foot placement prior to stand. Pt ambulates 66' with RUE PFRW CGA/min assist of one person, 2nd person moving WC  to end of hallway (100') for visual goal with encouragement provided throughout for endurance and upright posture as well as looking forward. Pt demonstrates improved posture with encouragement and person positioned several feet in front of pt as goal. Pt reports fatigue at 57', unable to continue ambulation, and 2nd person brings WC behind pt to sit.  Pt transported back to room in [facility] dependently for time management and requests to return to bed. Pt transfers stand pivot with RUE PFRW to L with CGA, and requires min assist for sit to supine with encouragement to utilize BLE musculature to bring BLEs into bed, verbal cues for technique. Pt requires assist x2 for scooting towards HOB. Pt remains in bed with HOB elevated, all needs within reach, call light in place at end of session.    SESSION 2: Pt presents in room in bed at 1420, reporting having just returned from [place] and requesting increased time to eat lunch. Pt denies pain in feet at this time. Therapist returns at 1433 with pt present and agreeable to PT, RN present and reporting needing to change IV antibiotic during session with therapist communicating with RN to change bag. Therapist manages IV pole during session. Session focused on bed mobility training, transfer training, and gait training with RUE PFRW.  Pt completes supine to sit with supervision with hospital bed features (elevated HOB, hospital bed rails) able to mobilize BLEs off bed and trunk to upright. Therapist dons shoes seated EOB with verbal cues provided for sequencing for pt to self  direct care (loosen laces, place AFO on LE first, then don shoe) with pt verbalizing understanding. Pt completes sit<>stand with min assist to RUE PFRW however reports need to void urine and returns to sitting. Therapist with distant supervision for sitting balance EOB for voiding urine (charted) with pt able to manage pants and urinal placement without assist. Therapist empties full ostomy bag (charted)  and pt completes stand pivot with RUE PFRW min assist.  Pt transported dependently in [facility] for time management to day room. Pt completes stand from [facility] with min assist (posterior LOB secondary to pt demonstrating insufficient forward wt shift as well as power production with BLEs for stand). Pt ambulates 33' from [facility] to mat with CGA/light min assist, verbal cues for BLE positioning with ambulation as well as looking forward. Increased UE weightbearing on RW during ambulation noted.   Pt requires increased time for seated rest break. Pt attempts sit<>stand from mat with pt demonstrating posterior LOB. Pt provided with verbal cues for increased momentum and power production for stand with pt significant improvement in BLE power production requiring very light min assist for immediate standing balance with 2nd trial stand. Pt then ambulates back to [facility] 33' with CGA one instance posterior LOB with min assist to correct, secondary to pt placing BLEs anterior to BOS. Therapist provides verbal cues for foot placement with pt demonstrating significant improvement in BOS, step length, foot clearance, and upright posture with pt looking forward during second trial gait.  Pt transported back to room dependently for time management and transfers back to bed stand pivot with RUE PFRW CGA/min assist. Pt provided with verbal cues for technique for BLEs into bed with light min assist provided for BLEs into bed. Pt requesting to scoot up towards [facility], therapist assist with placing pt BLEs in hooklying positioned and provided with verbal cues for scooting up towards HOB. Pt completes x3 scoots towards HOB with good gluteal clearance, mod assist for maintaining pt BLEs in hooklying position.  Pt remains supine in bed with HOB elevated, heels floated, all needs within reach, call light in place at end of session.   Therapy Documentation Precautions:  Precautions Precautions: Fall, Other (comment) Precaution Comments: L JP  drain, R colostomy/ileostomy, abdominal wound, R foot/ankle wound Required Braces or Orthoses: Other Brace Other Brace: B LE PRAFOs Restrictions Weight Bearing Restrictions: Yes RUE Weight Bearing: Weight bear through elbow only RLE Weight Bearing: Weight bearing as tolerated LLE Weight Bearing: Weight bearing as tolerated Other Position/Activity Restrictions: RUE WBAT thru elbow only, no lifting > 5 lbs; no ROM restrictions  Therapy/Group: Individual Therapy  Edwin Cap PT, DPT 09/24/2022, 3:50 PM
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
## 2022-09-24 NOTE — Procedures (Signed)
Patient was seen today for surgical drain contrast injection under fluoroscopy to evaluate drain for possible fistula. Injection was completed under fluoroscopy, no evidence of fistula was visualized. Patient tolerated procedure well without any immediate complication. Please see completed note under Imaging tab for detailed report of procedure and findings. Kennieth Francois, PA-C 09/24/2022

## 2022-09-25 DIAGNOSIS — T148XXA Other injury of unspecified body region, initial encounter: Secondary | ICD-10-CM

## 2022-09-25 DIAGNOSIS — L089 Local infection of the skin and subcutaneous tissue, unspecified: Secondary | ICD-10-CM

## 2022-09-25 DIAGNOSIS — E871 Hypo-osmolality and hyponatremia: Secondary | ICD-10-CM

## 2022-09-25 LAB — CBC WITH DIFFERENTIAL/PLATELET
Abs Immature Granulocytes: 0.02 10*3/uL (ref 0.00–0.07)
Basophils Absolute: 0.1 10*3/uL (ref 0.0–0.1)
Basophils Relative: 1 %
Eosinophils Absolute: 0.5 10*3/uL (ref 0.0–0.5)
Eosinophils Relative: 8 %
HCT: 21.5 % — ABNORMAL LOW (ref 39.0–52.0)
Hemoglobin: 6.8 g/dL — CL (ref 13.0–17.0)
Immature Granulocytes: 0 %
Lymphocytes Relative: 17 %
Lymphs Abs: 1 10*3/uL (ref 0.7–4.0)
MCH: 25.7 pg — ABNORMAL LOW (ref 26.0–34.0)
MCHC: 31.6 g/dL (ref 30.0–36.0)
MCV: 81.1 fL (ref 80.0–100.0)
Monocytes Absolute: 0.6 10*3/uL (ref 0.1–1.0)
Monocytes Relative: 11 %
Neutro Abs: 3.7 10*3/uL (ref 1.7–7.7)
Neutrophils Relative %: 63 %
Platelets: 466 10*3/uL — ABNORMAL HIGH (ref 150–400)
RBC: 2.65 MIL/uL — ABNORMAL LOW (ref 4.22–5.81)
RDW: 16.3 % — ABNORMAL HIGH (ref 11.5–15.5)
WBC: 5.8 10*3/uL (ref 4.0–10.5)
nRBC: 0 % (ref 0.0–0.2)

## 2022-09-25 LAB — URINE CULTURE: Culture: NO GROWTH

## 2022-09-25 LAB — BLOOD CULTURE ID PANEL (REFLEXED) - BCID2

## 2022-09-25 LAB — BASIC METABOLIC PANEL
Anion gap: 7 (ref 5–15)
BUN: 9 mg/dL (ref 6–20)
CO2: 23 mmol/L (ref 22–32)
Calcium: 8.9 mg/dL (ref 8.9–10.3)
Chloride: 105 mmol/L (ref 98–111)
Creatinine, Ser: 0.68 mg/dL (ref 0.61–1.24)
GFR, Estimated: >60 mL/min (ref >60)
Glucose, Bld: 93 mg/dL (ref 70–99)
Potassium: 3.7 mmol/L (ref 3.5–5.1)
Sodium: 135 mmol/L (ref 135–145)

## 2022-09-25 LAB — CULTURE, BLOOD (ROUTINE X 2): Special Requests: ADEQUATE

## 2022-09-25 LAB — HEMOGLOBIN AND HEMATOCRIT, BLOOD
HCT: 22.9 % — ABNORMAL LOW (ref 39.0–52.0)
Hemoglobin: 7.3 g/dL — ABNORMAL LOW (ref 13.0–17.0)
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
## 2022-09-25 NOTE — Progress Notes (Signed)
Critical Hemoglobin from the lab 6.8. Communicated to Ingenio PA who will communicate if any new orders in the morning.

## 2022-09-25 NOTE — Progress Notes (Signed)
MD Riley Kill made aware of pt 6.8 hemglobin this AM. No new orders at this time. Mylo Red, LPN
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
## 2022-09-25 NOTE — Progress Notes (Signed)
Patient reported able to burp his ileostomy bag independently. Patient is using right hand more.

## 2022-09-26 DIAGNOSIS — T8142XD Infection following a procedure, deep incisional surgical site, subsequent encounter: Secondary | ICD-10-CM

## 2022-09-26 LAB — CBC
HCT: 23.5 % — ABNORMAL LOW (ref 39.0–52.0)
Hemoglobin: 7.4 g/dL — ABNORMAL LOW (ref 13.0–17.0)
MCH: 25.6 pg — ABNORMAL LOW (ref 26.0–34.0)
MCHC: 31.5 g/dL (ref 30.0–36.0)
MCV: 81.3 fL (ref 80.0–100.0)
Platelets: 546 10*3/uL — ABNORMAL HIGH (ref 150–400)
RBC: 2.89 MIL/uL — ABNORMAL LOW (ref 4.22–5.81)
RDW: 16.3 % — ABNORMAL HIGH (ref 11.5–15.5)
WBC: 6.2 10*3/uL (ref 4.0–10.5)
nRBC: 0 % (ref 0.0–0.2)

## 2022-09-26 LAB — AEROBIC/ANAEROBIC CULTURE W GRAM STAIN (SURGICAL/DEEP WOUND)

## 2022-09-26 LAB — CULTURE, BLOOD (ROUTINE X 2): Special Requests: ADEQUATE

## 2022-09-26 LAB — VANCOMYCIN, PEAK: Vancomycin Pk: 30 ug/mL (ref 30–40)
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
## 2022-09-26 NOTE — Progress Notes (Addendum)
Physical Therapy Weekly Progress Note  Patient Details  Name: Shane Klein MRN: [id number] Date of Birth: June 18, 1990  Beginning of progress report period: Sep 19, 2022 End of progress report period: September 26, 2022  Today's Date: 09/26/2022 PT Individual Time: 1300-1406, 1455-1541 PT Individual Time Calculation (min): 66 min , 46 min   Patient has met 3 of 3 short term goals.  Patient is making excellent progress to long term goals. Pt largely requires min- supervision assist with bed mobility and CGA/min A for transfers and gait with max distance of 80 ft. Plan to continue hands on training with pt and family and continue patient education.   Patient continues to demonstrate the following deficits muscle weakness, muscle joint tightness, and muscle paralysis, decreased cardiorespiratoy endurance, and abnormal tone and decreased coordination and therefore will continue to benefit from skilled PT intervention to increase functional independence with mobility.  Patient progressing toward long term goals..  Continue plan of care.  PT Short Term Goals Week 1:  PT Short Term Goal 1 (Week 1): Pt will perform rolling R/L with min assist PT Short Term Goal 1 - Progress (Week 1): Met PT Short Term Goal 2 (Week 1): Pt will perform supine<>sit with max assist of 1 PT Short Term Goal 2 - Progress (Week 1): Met PT Short Term Goal 3 (Week 1): Pt will maintain static sitting balance EOM for at least 2 minutes with no more than supervision PT Short Term Goal 3 - Progress (Week 1): Met PT Short Term Goal 4 (Week 1): Pt will progress to standing tolerance in tilt table or harness support for at least 5 minutes PT Short Term Goal 4 - Progress (Week 1): Met PT Short Term Goal 5 (Week 1): Pt will transfer bed<>chair with +2 mod assist using LRAD PT Short Term Goal 5 - Progress (Week 1): Met Week 2:  PT Short Term Goal 1 (Week 2): Pt will require mod A x 1 with w/c<>mat transfer PT Short Term Goal 1 -  Progress (Week 2): Met PT Short Term Goal 2 (Week 2): Pt will require mod A with STS PT Short Term Goal 2 - Progress (Week 2): Met PT Short Term Goal 3 (Week 2): Pt will require (S) for w/c part management PT Short Term Goal 3 - Progress (Week 2): Partly met PT Short Term Goal 4 (Week 2): Pt will require mod A with supine<>sit PT Short Term Goal 4 - Progress (Week 2): Met Week 3:  PT Short Term Goal 1 (Week 3): Pt will require min A for w/c<>mat transfer PT Short Term Goal 1 - Progress (Week 3): Met PT Short Term Goal 2 (Week 3): Pt will require min A for STS PT Short Term Goal 2 - Progress (Week 3): Met PT Short Term Goal 3 (Week 3): Pt will require mod A x 1 for gait x 10 ft with LRAD PT Short Term Goal 3 - Progress (Week 3): Met Week 4:  PT Short Term Goal 1 (Week 4): STG=LTG's due to ELOS  Skilled Therapeutic Interventions/Progress Updates:      Therapy Documentation Precautions:  Precautions Precautions: Fall, Other (comment) Precaution Comments: L JP drain, R colostomy/ileostomy, abdominal wound, R foot/ankle wound Required Braces or Orthoses: Other Brace Other Brace: B LE PRAFOs Restrictions Weight Bearing Restrictions: Yes RUE Weight Bearing: Weight bear through elbow only RLE Weight Bearing: Weight bearing as tolerated LLE Weight Bearing: Weight bearing as tolerated Other Position/Activity Restrictions: RUE WBAT thru elbow only, no  lifting > 5 lbs; no ROM restrictions   Treatment Session 1:   Pt received semi-reclined in bed with family friend present. Pt agreeable to PT, requesting nursing assistance for emptying of ostomy. PT notified nurse who assisted patient with ostomy management. Pt requires (S) for supine to sit and CGA with stand pivot transfer with PFRW to w/c. Pt propelled w/c ~120 ft with (S) for safety and transported remaining distance to ADL apartment. Pt CGA with gait ~80 ft with PFRW to simulate household ambulation in narrow and enclosed spaces on carpet  and tile. Pt transported to room and requires (S) for sit to lying by hooking R LE under L LE and left semi-reclined in bed with all needs in reach. Pt with unrated hip pain, provided rest and repositioning.    Treatment Session 2:   Pt received semi-reclined in bed and agreeable to stretching program as pt reports left hip tightness. PT performed passive ROM to bilateral hip/knee/ankle joints and pt reported improvement in symptoms. Pt engaged in discussion regarding sexual health. PT educated pt on sexual aids, positioning, and precautions (pressure ulcers, bowel management, and bladder management). Pt receptive to education. Pt emptied ostomy min A from PT and pt provided with timer to keep track of how frequent he should empty his ostomy. Pt left semi-reclined in bed with all needs in reach.   Therapy/Group: Individual Therapy  Truitt Leep Truitt Leep PT, DPT  09/26/2022, 7:49 AM

## 2022-09-26 NOTE — Progress Notes (Signed)
Occupational Therapy Session Note  Patient Details  Name: Shane Klein MRN: [id number] Date of Birth: [birth year]  Today's Date: 09/26/2022 OT Individual Time: 5784-6962 OT Individual Time Calculation (min): 60 min   Short Term Goals: Week 3:  OT Short Term Goal 1 (Week 3): STGS=LTGs due to patient's length of stay.  Skilled Therapeutic Interventions/Progress Updates:  Pt received resting in bed for skilled OT session with focus on BADL participation. Pt agreeable to interventions, demonstrating overall pleasant mood. Pt with un-rated B-foot sensitivity. OT offering intermediate rest breaks and positioning suggestions throughout session to address pain/fatigue and maximize participation/safety in session.  Pt receptive to initiating independent ostomy management, successfully emptying small amount of output with Min A + cuing, using gloves. Pt comes to EOB with supervision + HOB elevated. Pt performs stand-step transfer to WC with Min-CGA + RW. Sitting in WC at sink-side, pt performs LB bathing/dressing with Min A for B-lower legs and threading due to time constraints. Pt stands with CGA, able to alternate UE support on RW to clean his buttock and pull his pants over bottom/hips, requiring Min A for standing balance.  Pt remained sitting in [facility] with visitor present to provide the necessary supervision to complete UB bathing/dressing.  Pt continues to be appropriate for skilled OT intervention to promote further functional independence.   Therapy Documentation Precautions:  Precautions Precautions: Fall, Other (comment) Precaution Comments: L JP drain, R colostomy/ileostomy, abdominal wound, R foot/ankle wound Required Braces or Orthoses: Other Brace Other Brace: B LE PRAFOs Restrictions Weight Bearing Restrictions: Yes RUE Weight Bearing: Weight bear through elbow only RLE Weight Bearing: Weight bearing as tolerated LLE Weight Bearing: Weight bearing as tolerated Other  Position/Activity Restrictions: RUE WBAT thru elbow only, no lifting > 5 lbs; no ROM restrictions   Therapy/Group: Individual Therapy  Lou Cal, OTR/L, MSOT  09/26/2022, 5:34 AM

## 2022-09-27 LAB — TYPE AND SCREEN: ABO/RH(D): A POS

## 2022-09-27 LAB — CBC WITH DIFFERENTIAL/PLATELET
Abs Immature Granulocytes: 0.01 10*3/uL (ref 0.00–0.07)
Abs Immature Granulocytes: 0.02 10*3/uL (ref 0.00–0.07)
Basophils Absolute: 0 10*3/uL (ref 0.0–0.1)
Basophils Absolute: 0 10*3/uL (ref 0.0–0.1)
Basophils Relative: 1 %
Basophils Relative: 1 %
Eosinophils Absolute: 0.5 10*3/uL (ref 0.0–0.5)
Eosinophils Absolute: 0.5 10*3/uL (ref 0.0–0.5)
Eosinophils Relative: 9 %
Eosinophils Relative: 9 %
HCT: 21.6 % — ABNORMAL LOW (ref 39.0–52.0)
HCT: 22.3 % — ABNORMAL LOW (ref 39.0–52.0)
Hemoglobin: 6.8 g/dL — CL (ref 13.0–17.0)
Hemoglobin: 7 g/dL — ABNORMAL LOW (ref 13.0–17.0)
Immature Granulocytes: 0 %
Immature Granulocytes: 0 %
Lymphocytes Relative: 17 %
Lymphocytes Relative: 21 %
Lymphs Abs: 1 10*3/uL (ref 0.7–4.0)
Lymphs Abs: 1.2 10*3/uL (ref 0.7–4.0)
MCH: 25.8 pg — ABNORMAL LOW (ref 26.0–34.0)
MCH: 26 pg (ref 26.0–34.0)
MCHC: 31.4 g/dL (ref 30.0–36.0)
MCHC: 31.5 g/dL (ref 30.0–36.0)
MCV: 82.3 fL (ref 80.0–100.0)
MCV: 82.4 fL (ref 80.0–100.0)
Monocytes Absolute: 0.7 10*3/uL (ref 0.1–1.0)
Monocytes Absolute: 0.7 10*3/uL (ref 0.1–1.0)
Monocytes Relative: 13 %
Monocytes Relative: 13 %
Neutro Abs: 3.1 10*3/uL (ref 1.7–7.7)
Neutro Abs: 3.5 10*3/uL (ref 1.7–7.7)
Neutrophils Relative %: 56 %
Neutrophils Relative %: 60 %
Platelets: 500 10*3/uL — ABNORMAL HIGH (ref 150–400)
Platelets: 505 10*3/uL — ABNORMAL HIGH (ref 150–400)
RBC: 2.62 MIL/uL — ABNORMAL LOW (ref 4.22–5.81)
RBC: 2.71 MIL/uL — ABNORMAL LOW (ref 4.22–5.81)
RDW: 16.5 % — ABNORMAL HIGH (ref 11.5–15.5)
RDW: 16.6 % — ABNORMAL HIGH (ref 11.5–15.5)
WBC: 5.5 10*3/uL (ref 4.0–10.5)
WBC: 5.7 10*3/uL (ref 4.0–10.5)
nRBC: 0 % (ref 0.0–0.2)
nRBC: 0 % (ref 0.0–0.2)

## 2022-09-27 LAB — COMPREHENSIVE METABOLIC PANEL
ALT: 101 U/L — ABNORMAL HIGH (ref 0–44)
AST: 38 U/L (ref 15–41)
Albumin: 1.9 g/dL — ABNORMAL LOW (ref 3.5–5.0)
Alkaline Phosphatase: 103 U/L (ref 38–126)
Anion gap: 5 (ref 5–15)
BUN: 13 mg/dL (ref 6–20)
CO2: 22 mmol/L (ref 22–32)
Calcium: 9.2 mg/dL (ref 8.9–10.3)
Chloride: 108 mmol/L (ref 98–111)
Creatinine, Ser: 0.73 mg/dL (ref 0.61–1.24)
GFR, Estimated: >60 mL/min (ref >60)
Glucose, Bld: 92 mg/dL (ref 70–99)
Potassium: 3.9 mmol/L (ref 3.5–5.1)
Sodium: 135 mmol/L (ref 135–145)
Total Bilirubin: 0.6 mg/dL (ref 0.3–1.2)
Total Protein: 6.3 g/dL — ABNORMAL LOW (ref 6.5–8.1)

## 2022-09-27 LAB — PREPARE RBC (CROSSMATCH)

## 2022-09-27 LAB — CULTURE, BLOOD (ROUTINE X 2): Culture: NO GROWTH

## 2022-09-27 LAB — BPAM RBC

## 2022-09-27 LAB — VANCOMYCIN, TROUGH: Vancomycin Tr: 14 ug/mL — ABNORMAL LOW (ref 15–20)

## 2022-09-27 LAB — AEROBIC/ANAEROBIC CULTURE W GRAM STAIN (SURGICAL/DEEP WOUND)

## 2022-09-27 LAB — MAGNESIUM: Magnesium: 1.6 mg/dL — ABNORMAL LOW (ref 1.7–2.4)

## 2022-09-27 MED ORDER — VANCOMYCIN HCL 750 MG/150ML IV SOLN
750.0000 mg | Freq: Two times a day (BID) | INTRAVENOUS | Status: DC
  Filled 2022-09-27: qty 150

## 2022-09-27 MED ORDER — VANCOMYCIN HCL 2000 MG/400ML IV SOLN
2000.0000 mg | Freq: Two times a day (BID) | INTRAVENOUS | Status: DC
  Filled 2022-09-27: qty 400

## 2022-09-27 MED ORDER — SODIUM CHLORIDE 0.9% IV SOLUTION: Freq: Once | INTRAVENOUS | Status: AC

## 2022-09-27 NOTE — Progress Notes (Addendum)
Pharmacy Antibiotic Note  Shane Klein is a 32 y.o. male admitted on 09/03/2022 with rule out sepsis.  Pharmacy has been consulted for Vancomycin and Zosyn dosing. RN instructed not to give antibiotics until blood cultures drawn per provider request. SCr stable <1.  Levels 6/3 with elevated AUC  Plan: Vancomycin to 750 IV Q 12 Zosyn 3.375 grams IV Q 8 hours - 4 hr infusion Monitor clinical progress, c/s, renal function F/u de-escalation plan/LOT, vancomycin levels as indicated   Height: 5\' 10"  (177.8 cm) Weight: 59.3 kg (130 lb 11.7 oz) IBW/kg (Calculated) : 73  Temp (24hrs), Avg:98.1 F (36.7 C), Min:97.6 F (36.4 C), Max:98.4 F (36.9 C)  Recent Labs  Lab 09/21/22 0530 09/23/22 0545 09/23/22 0545 09/23/22 2202 09/24/22 0042 09/24/22 1549 09/25/22 0535 09/26/22 0820 09/26/22 1513 09/27/22 0014 09/27/22 0730  WBC  --  7.5   < > 5.9  --   --  5.8 6.2  --  5.5 5.7  CREATININE 0.58* 0.58*  --   --   --  0.65 0.68  --   --  0.73  --   LATICACIDVEN  --   --   --  1.2 1.2  --   --   --   --   --   --   VANCOTROUGH  --   --   --   --   --   --   --   --   --  14*  --   VANCOPEAK  --   --   --   --   --   --   --   --  30  --   --    < > = values in this interval not displayed.     Estimated Creatinine Clearance: 112.2 mL/min (by C-G formula based on SCr of 0.73 mg/dL).    Allergies  Allergen Reactions   Lactose Intolerance (Gi)     Pt advised RN he is lactose intolerant.    Thank you Okey Regal, PharmD 09/27/2022 8:44 AM

## 2022-09-27 NOTE — Progress Notes (Signed)
Patient had imaging performed on 5/31 that showed no evidence of drain fistula. CT A/P showed no new or undrained fluid collections. There was a small residual cavity adjacent to the drain catheter in the right anterior pelvis. Reviewed imaging with my attending with plan to leave JP drain for now. TRN to remove retention sutures. Update with plan sent to primary team on 5/31. Will reach back out today as well. Appreciate their assistance. We will continue to follow and plan to see again later this week. Please call sooner for any changes or questions.   Jacinto Halim, PA-C
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
## 2022-09-27 NOTE — Progress Notes (Signed)
Occupational Therapy Session Note  Patient Details  Name: Shane Klein MRN: [id number] Date of Birth: [birth year]  Today's Date: 09/27/2022 OT Individual Time: 1425-1538 OT Individual Time Calculation (min): 73 min    Short Term Goals: Week 3:  OT Short Term Goal 1 (Week 3): STGS=LTGs due to patient's length of stay.  Skilled Therapeutic Interventions/Progress Updates:  Pt received resting in bed for skilled OT session with focus on fuctional transfers, standing tolerance/balance, and LB ADL simulation. Pt agreeable to interventions, demonstrating overall pleasant mood. Pt with un-rated pain. OT offering intermediate rest breaks and positioning suggestions throughout session to address pain/fatigue and maximize participation/safety in session.   Pt dependent for donning AFOs/shoes at bed level for time management. Pt comes to EOB from flat bed with supervision + use of bed rail. Pt performs stand-step transfer from EOB>WC with CGA + RW, performing all other STS transfer with similar levels of assistance. Pt dependent for WC transport to day room for enery conservation.   In main therapy gym, pt participates dynamic balance activity, targeting stepping out of BOS with BLE to promote WB/strengthening. Pt retrieves and steps forward to place a squig on the mirror. Pt performs with overall CGA + RW, requiring activity to be downgraded wen with stepping RLE forward, due to increased weakness in LLE.   Pt simulates functional reach needed for posterior hygiene and LB garment management with use of resistive clips, standing with CGA + RW to remove from backside.  Pt then defers further standing activities, session transitioning to UE strengthening, pt participating in following exercises with 3# dowel bar: -Chest press -Overhead press -Bicep curls  Pt performs 1 set/ 10 reps of each with no pain expressed.   Pt with episode of urine incontinence upon arrival back to room, requiring Max A for  urinal and clothing management.   Pt remained resting in bed with nursing present. All other immediate needs met at end of session. Pt continues to be appropriate for skilled OT intervention to promote further functional independence.   Therapy Documentation Precautions:  Precautions Precautions: Fall, Other (comment) Precaution Comments: L JP drain, R colostomy/ileostomy, abdominal wound, R foot/ankle wound Required Braces or Orthoses: Other Brace Other Brace: B LE PRAFOs Restrictions Weight Bearing Restrictions: Yes RUE Weight Bearing: Weight bear through elbow only RLE Weight Bearing: Weight bearing as tolerated LLE Weight Bearing: Weight bearing as tolerated Other Position/Activity Restrictions: RUE WBAT thru elbow only, no lifting > 5 lbs; no ROM restrictions   Therapy/Group: Individual Therapy  Lou Cal, OTR/L, MSOT  09/27/2022, 6:21 AM

## 2022-09-27 NOTE — Progress Notes (Signed)
Patient refused getting a second IV to start blood transfusion this morning. With patient getting both antibiotics, day shift was not able to start transfusion due to antibiotics running all day. Hand off given to night shift to start blood transfusion.   Shane Klein [name]

## 2022-09-27 NOTE — Progress Notes (Signed)
Physical Therapy Session Note  Patient Details  Name: Shane Klein MRN: [id number] Date of Birth: 1990-05-01  Today's Date: 09/27/2022 PT Individual Time: 1000-1100 PT Individual Time Calculation (min): 60 min   Short Term Goals: Week 4:  PT Short Term Goal 1 (Week 4): STG=LTG's due to ELOS  Skilled Therapeutic Interventions/Progress Updates:      Therapy Documentation Precautions:  Precautions Precautions: Fall, Other (comment) Precaution Comments: L JP drain, R colostomy/ileostomy, abdominal wound, R foot/ankle wound Required Braces or Orthoses: Other Brace Other Brace: B LE PRAFOs Restrictions Weight Bearing Restrictions: Yes RUE Weight Bearing: Weight bear through elbow only RLE Weight Bearing: Weight bearing as tolerated LLE Weight Bearing: Weight bearing as tolerated Other Position/Activity Restrictions: RUE WBAT thru elbow only, no lifting > 5 lbs; no ROM restrictions   Pt and his mother agreeable to PT session with emphasis on family training. PT instructed pt's mother on how to donn/doff AFO's and pt's mom requires min A with placement. Pt (S) with bed mobility with use of bed features. Pt attempted to stand and reports urinary urgency, pt successful voided and PT documented in flowsheet. Pt requires CGA/min A for ambulatory transfer to sink and CGA for dynamic standing balance for handwashing. Pt ambulated to w/c and PT educated pt's mother on how to lock/unlock and leg rest management. PT also provided pt and family with information regarding portable ramp to prepare for discharge. Pt left seated in w/c at bedside with all needs in reach.     Therapy/Group: Individual Therapy  Truitt Leep Truitt Leep PT, DPT  09/27/2022, 7:35 AM

## 2022-09-27 NOTE — Consult Note (Signed)
WOC Nurse ostomy follow up Patient is now on contact isolation due to bactremia and Staph in blood culture.  Discussed at length with patient and mother.  Mother understands the need for gowning and gloving and hand washing.   Pouch we placed on 5/30 was still in place.  Patient has been emptying pouch some.  Mother and patient are making great strides in ostomy education and acceptance.  They are pleasant and encouraging to each other today during pouch change.  Stoma type/location: RLQ ileostomy Stomal assessment/size: 1 3/8", cut off center to avoid midline incision.  Peristomal assessment: intact.  No breakdown.  We pouch with just a barrier ring and no powder or skin prep today. I discuss the powder and skin prep being a problem focused intervention for wet, weepy skin.  He is agreeable to try without today.   Treatment options for stomal/peristomal skin: barrier ring and 2 piece 2 1/4" pouch.   Output soft yellow stool Ostomy pouching: 2pc.2 1/4" pouch with barrier ring   Education provided: Held powder and skin prep since skin is intact.  Leaks are not happening as often.  Mother cut barrier in center instead of to one side for wound, so we cut a second barrier.  She is gaining confidence in pouch changes.  She removed old pouch, cleansed skin, applied barrier ring and pouch.  She smoothed to seal barrier and rolled closed.  We will change again Wednesday or Thursday.  I will order more ostomy supplies this morning for the room. Bedside RN and student nurse agree on time with mom to work on wound care in preparation for discharge soon.   Enrolled patient in [place] [employer] Discharge program: Yes  Will continue teaching.  Mike Gip MSN, RN, FNP-BC [facility] Wound, Ostomy, Continence Nurse Outpatient [facility] [phone] Pager [phone]
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
## 2022-09-27 NOTE — Progress Notes (Signed)
Received call from lab regarding a critical value. On call PA [phone] notified. New order to recheck CBC. Pt with no complaints at this time.

## 2022-09-28 LAB — TYPE AND SCREEN
Antibody Screen: NEGATIVE
Unit division: 0

## 2022-09-28 LAB — BPAM RBC
Blood Product Expiration Date: 202406272359
ISSUE DATE / TIME: 202406032253
Unit Type and Rh: 6200

## 2022-09-28 LAB — CULTURE, BLOOD (ROUTINE X 2)

## 2022-09-28 MED ORDER — LIDOCAINE-PRILOCAINE 2.5-2.5 % EX CREA
TOPICAL_CREAM | Freq: Once | CUTANEOUS | Status: DC
  Filled 2022-09-28: qty 5

## 2022-09-28 NOTE — Progress Notes (Signed)
Occupational Therapy Session Note  Patient Details  Name: Shane Klein MRN: [id number] Date of Birth: November 07, 1990  Today's Date: 09/28/2022 OT Individual Time: 0900-1000 OT Individual Time Calculation (min): 60 min    Short Term Goals: Week 3:  OT Short Term Goal 1 (Week 3): STGS=LTGs due to patient's length of stay.  Skilled Therapeutic Interventions/Progress Updates:   Pt seen for skilled OT session this am. Strong focus on progressing AM self care and mobility skills for prep for tentative d/c next week. Pt reports significant increased R hand function and was able to integrate well as stabilizer to functional contributor with limitations for weakness and FMC only. Bed level LB bathing with pt able to complete peri and buttocks hygiene with set up only, mod A for feet and lotion application due to dryness. Moved to EOB for UB doffing pull over shirt with CGA, UB sponge bathing with close S, and UB pull over shirt with close S. OT assisted pull on pants over feet and pt then stood with R PRW with min A and pants mngt with tolerance up to 2 min and side stepped L to head of bed with facilitation for L LE stability and balance. Pt then able to complete sit to supine with increased time but no physical assist for LE mngt. Left pt with all needs and safety measures in place.    Pain: un-rated pain L LE but no interference during function, floated heels and repositioned for comfort   Therapy Documentation Precautions:  Precautions Precautions: Fall, Other (comment) Precaution Comments: L JP drain, R colostomy/ileostomy, abdominal wound, R foot/ankle wound Required Braces or Orthoses: Other Brace Other Brace: B LE PRAFOs Restrictions Weight Bearing Restrictions: No RUE Weight Bearing: Weight bearing as tolerated RLE Weight Bearing: Non weight bearing LLE Weight Bearing: Weight bearing as tolerated Other Position/Activity Restrictions: RUE WBAT thru elbow only, no lifting > 5 lbs; no ROM  restrictions General:    Therapy/Group: Individual Therapy  Vicenta Dunning 09/28/2022, 7:48 AM

## 2022-09-28 NOTE — Progress Notes (Signed)
Trauma Event Note  TRN attempted to remove retention sutures twice today.  The first time, the patient was eating lunch and asked RN to come back later.  TRN attempted again @ 1445 and pt was in the middle of physical therapy.  After this, TRN had multiple activations and was unable to come back.  Will pass this along.  It was also noted that TRN yesterday attempted to get them out and there were 2 embedded in the skin.  PA notified.     Last imported Vital Signs BP (!) 99/58 (BP Location: Left Arm)   Pulse 92   Temp 98.8 F (37.1 C) (Oral)   Resp 18   Ht 5\' 10"  (1.778 m)   Wt 130 lb 11.7 oz (59.3 kg)   SpO2 100%   BMI 18.76 kg/m   Trending CBC Recent Labs    09/26/22 0820 09/27/22 0014 09/27/22 0730  WBC 6.2 5.5 5.7  HGB 7.4* 6.8* 7.0*  HCT 23.5* 21.6* 22.3*  PLT 546* 505* 500*    Trending Coag's No results for input(s): "APTT", "INR" in the last 72 hours.  Trending BMET Recent Labs    09/27/22 0014  NA 135  K 3.9  CL 108  CO2 22  BUN 13  CREATININE 0.73  GLUCOSE 92    Tomi Grandpre W  Trauma Response RN  Please call TRN at [phone] for further assistance.

## 2022-09-28 NOTE — Progress Notes (Signed)
Patient ID: LEOR DINSE, male   DOB: 06/13/1990, 32 y.o.   MRN: [id number]  Met with pt and sister who was present in his room, to update both regarding team conference progress toward his goals of min assist. Very pleased with his ambulation and feels he is doing better than he thought he would. He continues to do well and push himself in therapies. He is also learning his ostomy care as is his Mom and sister. Have ordered hospital bed and platform rolling walker. Pt may get padded tub bench on own since not covered. Continue to work on discharge needs for discharge 6/11.

## 2022-09-28 NOTE — Progress Notes (Signed)
Physical Therapy Note  Patient Details  Name: Shane Klein MRN: [id number] Date of Birth: Dec 31, 1990 Today's Date: 09/28/2022    Pt missed 60 minutes of skilled PT on 6/3 as pt making up missed minutes with OT.    Truitt Leep 09/28/2022, 4:38 PM
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
## 2022-09-28 NOTE — Progress Notes (Signed)
Occupational Therapy Weekly Progress Note  Patient Details  Name: Shane Klein MRN: [id number] Date of Birth: 1990-11-06  Beginning of progress report period: Sep 21, 2022 End of progress report period: September 28, 2022  Today's Date: 09/28/2022 OT Individual Time: 4098-1191 OT Individual Time Calculation (min): 44 min    Patient continues to progress towards LTG this reporting. Currently requiring overall setup for UB ADLs and progressing towards setup for LB ADLs. As of this reporting period, pt continues to require Min A for bathing/dressing. Pt's mother to be present for TTB transfer prior to discharge.   Patient continues to demonstrate the following deficits: muscle weakness, decreased cardiorespiratoy endurance, unbalanced muscle activation and decreased coordination, and decreased standing balance and decreased balance strategies and therefore will continue to benefit from skilled OT intervention to enhance overall performance with BADL and Reduce care partner burden.  Patient progressing toward long term goals..  Continue plan of care.  OT Short Term Goals Week 3:  OT Short Term Goal 1 (Week 3): STGS=LTGs due to patient's length of stay. OT Short Term Goal 1 - Progress (Week 3): Progressing toward goal Week 4:  OT Short Term Goal 1 (Week 4): STGS=LTGs due to patient's length of stay.  Skilled Therapeutic Interventions/Progress Updates:  Pt received resting in bed for skilled OT session with focus on functional transfers and LB dressing/AE. Pt agreeable to interventions, demonstrating overall pleasant mood. Pt with un-rated pain, but reports "today is a better way." OT offering intermediate rest breaks and positioning suggestions throughout session to address pain/fatigue and maximize participation/safety in session.  Pt performs bed mobility with supervision, requesting visitor dependently don AFO/shoes. Pt performs all functional transfers with CGA + RW, using stand-step method. Pt  initiates WC propulsion towards day room, WC found to be dumped forward. Pt transfers out onto EOM, different WC later retrieved.   At [facility], pt uses reacher to thread BLE into oversized pants with only cuing for technique, standing to don over bottom/hips with similar levels of assistance.  Pt with direct handoff to PT, all other immediate needs met at end of session. Pt continues to be appropriate for skilled OT intervention to promote further functional independence.   Therapy Documentation Precautions:  Precautions Precautions: Fall, Other (comment) Precaution Comments: L JP drain, R colostomy/ileostomy, abdominal wound, R foot/ankle wound Required Braces or Orthoses: Other Brace Other Brace: B LE PRAFOs Restrictions Weight Bearing Restrictions: No RUE Weight Bearing: Weight bearing as tolerated RLE Weight Bearing: Non weight bearing LLE Weight Bearing: Weight bearing as tolerated Other Position/Activity Restrictions: RUE WBAT thru elbow only, no lifting > 5 lbs; no ROM restrictions   Therapy/Group: Individual Therapy  Lou Cal, OTR/L, MSOT  09/28/2022, 6:03 AM

## 2022-09-28 NOTE — Progress Notes (Signed)
Physical Therapy Session Note  Patient Details  Name: Shane Klein MRN: [id number] Date of Birth: 05-11-1990  Today's Date: 09/28/2022 PT Individual Time: 1349-1531, 1102- 1130  PT Individual Time Calculation (min): 102 min, 28 min    Short Term Goals: Week 4:  PT Short Term Goal 1 (Week 4): STG=LTG's due to ELOS  Skilled Therapeutic Interventions/Progress Updates:      Therapy Documentation Precautions:  Precautions Precautions: Fall, Other (comment) Precaution Comments: L JP drain, R colostomy/ileostomy, abdominal wound, R foot/ankle wound Required Braces or Orthoses: Other Brace Other Brace: B LE PRAFOs Restrictions Weight Bearing Restrictions: No RUE Weight Bearing: Weight bearing as tolerated RLE Weight Bearing: Non weight bearing LLE Weight Bearing: Weight bearing as tolerated Other Position/Activity Restrictions: RUE WBAT thru elbow only, no lifting > 5 lbs; no ROM restrictions  Treatment Session 1:   Pt received semi-reclined in bed, agreeable to PT session. PT reminded pt to utilize timer for emptying of his ostomy and pt requires min A for set-up.   Pt agreeable to practice lower bod dressing in modified long sitting position with HOB elevated for back support. Pt able to perform contralateral reaching and requires mod A for donning of bilateral socks. Plan to practice donning of pants and placement of AFO/shoes in upcomming session.   Pt with unrated left hip pain, PT provided passive stretching at hip and knee joint for relief.   Pt left semi-reclined in bed with all needs in reach.   Treatment Session 2:   Pt received in care of OT and pt agreeable to make up missed minutes from yesterday's session. PT educated pt on multiple techniques for donning/doffing AFO with shoes. Pt able to perform with set-up assist for right foot and requires min A for L LE due to significant foot drop. Pt reports need to empty ostomy and pt dependently transported to room and  requires min A for ostomy management. Pt transported to dayroom and [facility] medical present to adjust w/c dump and alignment following change in wheel/rims. Pt content with modifications. PT assessed w/c mobilty for letter of medical necessity and outcomes listed below:   W/C Evaluation ~30 ft   Standard w/c: 11.63 seconds in 10 strokes   K5 w/c: 7.95 seconds in 7 strokes   Pt CGA/min A for sit to stand throughout session and with gait 15 ft + 10 ft with platform RW. Pt requires min A for sit to lying and left semi-reclined in bed with all needs in reach. Pt with left hip pain and provided rest and repositioning for relief.   Therapy/Group: Individual Therapy  Truitt Leep Truitt Leep PT, DPT  09/28/2022, 7:58 AM

## 2022-09-29 LAB — CBC WITH DIFFERENTIAL/PLATELET
Abs Immature Granulocytes: 0.01 10*3/uL (ref 0.00–0.07)
Basophils Absolute: 0 10*3/uL (ref 0.0–0.1)
Basophils Relative: 1 %
Eosinophils Absolute: 0.4 10*3/uL (ref 0.0–0.5)
Eosinophils Relative: 6 %
HCT: 27.6 % — ABNORMAL LOW (ref 39.0–52.0)
Hemoglobin: 9 g/dL — ABNORMAL LOW (ref 13.0–17.0)
Immature Granulocytes: 0 %
Lymphocytes Relative: 17 %
Lymphs Abs: 1.2 10*3/uL (ref 0.7–4.0)
MCH: 26.9 pg (ref 26.0–34.0)
MCHC: 32.6 g/dL (ref 30.0–36.0)
MCV: 82.4 fL (ref 80.0–100.0)
Monocytes Absolute: 0.7 10*3/uL (ref 0.1–1.0)
Monocytes Relative: 10 %
Neutro Abs: 4.6 10*3/uL (ref 1.7–7.7)
Neutrophils Relative %: 66 %
Platelets: 699 10*3/uL — ABNORMAL HIGH (ref 150–400)
RBC: 3.35 MIL/uL — ABNORMAL LOW (ref 4.22–5.81)
RDW: 16.8 % — ABNORMAL HIGH (ref 11.5–15.5)
WBC: 7 10*3/uL (ref 4.0–10.5)
nRBC: 0 % (ref 0.0–0.2)

## 2022-09-29 LAB — BASIC METABOLIC PANEL
Anion gap: 7 (ref 5–15)
BUN: 11 mg/dL (ref 6–20)
CO2: 24 mmol/L (ref 22–32)
Calcium: 9.9 mg/dL (ref 8.9–10.3)
Chloride: 105 mmol/L (ref 98–111)
Creatinine, Ser: 0.57 mg/dL — ABNORMAL LOW (ref 0.61–1.24)
GFR, Estimated: >60 mL/min (ref >60)
Glucose, Bld: 96 mg/dL (ref 70–99)
Potassium: 3.6 mmol/L (ref 3.5–5.1)
Sodium: 136 mmol/L (ref 135–145)

## 2022-09-29 LAB — AEROBIC/ANAEROBIC CULTURE W GRAM STAIN (SURGICAL/DEEP WOUND)

## 2022-09-29 LAB — MAGNESIUM: Magnesium: 1.1 mg/dL — ABNORMAL LOW (ref 1.7–2.4)

## 2022-09-29 MED ORDER — MAGNESIUM SULFATE 4 GM/100ML IV SOLN
4.0000 g | Freq: Once | INTRAVENOUS | Status: AC
  Administered 2022-09-29: 4 g via INTRAVENOUS
  Filled 2022-09-29: qty 100
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
## 2022-09-29 NOTE — Progress Notes (Addendum)
Physical Therapy Session Note  Patient Details  Name: Shane Klein MRN: [id number] Date of Birth: January 10, 1991  Today's Date: 09/29/2022 PT Individual Time: 1100-1209, 1300-1415  PT Individual Time Calculation (min): 69 min , 75 min   Short Term Goals: Week 4:  PT Short Term Goal 1 (Week 4): STG=LTG's due to ELOS  Skilled Therapeutic Interventions/Progress Updates:      Therapy Documentation Precautions:  Precautions Precautions: Fall, Other (comment) Precaution Comments: L JP drain, R colostomy/ileostomy, abdominal wound, R foot/ankle wound Required Braces or Orthoses: Other Brace Other Brace: B LE PRAFOs Restrictions Weight Bearing Restrictions: No RUE Weight Bearing: Non weight bearing RLE Weight Bearing: Non weight bearing LLE Weight Bearing: Weight bearing as tolerated Other Position/Activity Restrictions: RUE WBAT thru elbow only, no lifting > 5 lbs; no ROM restrictions  Treatment Session 1:   Pt received seated in w/c at bedside, agreeable to PT session. PT noted w/c malalignment in session that was fixed by additional PT following second session, therefore pt transported dependently.   Pt CGA with lateral scooting transfer from w/c to mat table and attempted to stand and reported urinary urgency. Pt provided with urinal and successfully voided.   Pt transitioned to blocked practice of step training with PFRW. Pt CGA/min A with STS and requires CGA for dynamic standing balance as pt performed step taps and transitioned to as step up with both feet on step. Initially pt utilized 1 inch step and progressed to 4 inch step with increased practice.   Pt transported to room and left seated in w/c at bedside with all needs in reach.     Treatment Session 2:   Pt received seated in w/c at bedside and requires set-up assist for emptying ostomy. Nurse notified of output.   Pt dependently transported for time by w/c to dayroom for AFO consult with Zollie Scale from [facility]. Pt  ambulated ~50 ft CGA with PFRW with bilateral PLS. Zollie Scale recommends pt continue PLS with longer strut. Pt agreeable to practice curb step but unable to due to w/c brake malfunction. Pt transported to room and left semi-reclined in bed and w/c fixed by additional therapist.    Therapy/Group: Individual Therapy  Truitt Leep Truitt Leep PT, DPT  09/29/2022, 7:41 AM

## 2022-09-29 NOTE — Consult Note (Signed)
WOC Nurse ostomy follow up:  Mom not present upon arrival to room.  Bedside RN had changed pouch that leaked early this morning.  Patient calls mom and WOC team  will do next teaching session on 10/02/22 at 0930 [facility]).  Mom agreeable to be here then.  She is independent in care, just wants additional practice.  Bedside staff can change (or assist mom) if pouch leaks.  Patient to continue practicing emptying.  Will follow.   Mike Gip MSN, RN, FNP-BC CWON Wound, Ostomy, Continence Nurse Outpatient [facility] [phone] Pager [phone]

## 2022-09-29 NOTE — Progress Notes (Signed)
Nutrition Follow-up  DOCUMENTATION CODES:   Non-severe (moderate) malnutrition in context of chronic illness  INTERVENTION:  - Continue Regular diet.   NUTRITION DIAGNOSIS:   Moderate Malnutrition related to chronic illness as evidenced by moderate muscle depletion, moderate fat depletion.  GOAL:   Patient will meet greater than or equal to 90% of their needs   MONITOR:   PO intake, Supplement acceptance, Skin  REASON FOR ASSESSMENT:   Malnutrition Screening Tool    ASSESSMENT:   33 y.o. male admits to CIR related to functional deficits secondary to spinal cord injury due to fragments in the spinal canal at T12 and L2. PMH includes: acute radial nerve palsy, right supracondylar humerus fracture.  Meds reviewed:  Vit C, Vit D, Vit B12, Questran, lomotil, ferrous sulfate, imodium, MVI, fibercon. Labs reviewed: Mag low.   Pt continues with good PO intakes. Per record, pt has mostly eaten 100% of his meals over the past 7 days. Pt reports that he has been eating well and has a great appetite. Pt with high ileostomy output. RD reviewed ileostomy diet education with the pt and provided him with handout. RD will continue to monitor PO intakes.   Diet Order:   Diet Order             Diet regular Room service appropriate? Yes; Fluid consistency: Thin  Diet effective now                   EDUCATION NEEDS:   Not appropriate for education at this time  Skin:  Skin Integrity Issues:: Stage II, Stage III DTI: R arm Stage II: R ankle; left tibial; Stage III: Mid buttocks Incisions: back; abdomen; left hip Other: R wrist  Last BM:  6/5 - 300 mL so far today. 1850 mL total in past 24 hrs via RUQ ileostomy  Height:   Ht Readings from Last 1 Encounters:  09/03/22 5\' 10"  (1.778 m)    Weight:   Wt Readings from Last 1 Encounters:  09/21/22 59.3 kg    Ideal Body Weight:     BMI:  Body mass index is 18.76 kg/m.  Estimated Nutritional Needs:   Kcal:  2200-2500  kcals  Protein:  110-130 gm  Fluid:  >/= 2 L/day  [name], RD, LDN, CNSC.

## 2022-09-29 NOTE — Progress Notes (Addendum)
Occupational Therapy Session Note  Patient Details  Name: Shane Klein MRN: [id number] Date of Birth: 05-30-1990  Today's Date: 09/29/2022 OT Individual Time: 0920-1019 OT Individual Time Calculation (min): 59 min  and Today's Date: 09/29/2022 OT Missed Time: 16 Minutes Missed Time Reason: Other (comment) (Therapist running behind with previous patient.)  Short Term Goals: Week 4:  OT Short Term Goal 1 (Week 4): STGS=LTGs due to patient's length of stay.  Skilled Therapeutic Interventions/Progress Updates:  Pt received resting in bed for skilled OT session with focus on BADL participation. Pt agreeable to interventions, demonstrating overall pleasant mood. Pt with un-rated BLE foot discomfort. OT offering intermediate rest breaks and positioning suggestions throughout session to address pain/fatigue and maximize participation/safety in session.  Pt empties ostomy bag with Min A to fold end. Pt comes to EOB with supervision + HOB elevated (pt discharging with hospital bed). Pt then performs stand-step transfer from EOB>WC, dependent for WC positioning at sink-side for bathing/grooming. Pt requires Min A for lower leg bathing and threading of shorts due to time constraints. Pt tolerating cleaning of B-feet with washcloth for desensitization, pt with increased cracking/flaking of skin around base of toes. Team updated and Eucerin cream applied per LPN orders. Pt stands with Mod A + RW to complete posterior peri-cleaning and to complete donning of shorts. Pt reporting increased instability with stance, educated on need to don AFO/shoes prior to standing for this task at home. Pt dependent for AFO/shoes. Pt setup with needed items for UB care, NT present to provide supervision.   Pt remained sitting in WC at sink-side with all immediate needs met at end of session. Pt continues to be appropriate for skilled OT intervention to promote further functional independence.   Therapy  Documentation Precautions:  Precautions Precautions: Fall, Other (comment) Precaution Comments: L JP drain, R colostomy/ileostomy, abdominal wound, R foot/ankle wound Required Braces or Orthoses: Other Brace Other Brace: B LE PRAFOs Restrictions Weight Bearing Restrictions: No RUE Weight Bearing: Non weight bearing RLE Weight Bearing: Non weight bearing LLE Weight Bearing: Weight bearing as tolerated Other Position/Activity Restrictions: RUE WBAT thru elbow only, no lifting > 5 lbs; no ROM restrictions   Therapy/Group: Individual Therapy  Lou Cal, OTR/L, MSOT  09/29/2022, 6:37 AM
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
## 2022-09-29 NOTE — Progress Notes (Signed)
Flushed drain with 10cc of normal saline as ordered. Leaking noted around insertion site. 2.5 cc of clear, serous fluid.Marland Kitchenemptied from drain. Mylo Red, LPN

## 2022-09-29 NOTE — TOC CAGE-AID Note (Signed)
Transition of Care [facility]) - CAGE-AID Screening   Patient Details  Name: [name] MRN: [id number] Date of Birth: July 01, 1990  Transition of Care [facility]) CM/SW Contact:    Leota Sauers, RN Phone Number: 09/29/2022, 10:01 PM   Clinical Narrative:  Denies use of alcohol and drugs. Education not offered at this time.  CAGE-AID Screening:    Have You Ever Felt You Ought to Cut Down on Your Drinking or Drug Use?: No Have People Annoyed You By Critizing Your Drinking Or Drug Use?: No Have You Felt Bad Or Guilty About Your Drinking Or Drug Use?: No Have You Ever Had a Drink or Used Drugs First Thing In The Morning to Steady Your Nerves or to Get Rid of a Hangover?: No CAGE-AID Score: 0  Substance Abuse Education Offered: No

## 2022-09-30 LAB — BASIC METABOLIC PANEL
Anion gap: 5 (ref 5–15)
BUN: 13 mg/dL (ref 6–20)
CO2: 22 mmol/L (ref 22–32)
Calcium: 9.5 mg/dL (ref 8.9–10.3)
Chloride: 108 mmol/L (ref 98–111)
Creatinine, Ser: 0.56 mg/dL — ABNORMAL LOW (ref 0.61–1.24)
GFR, Estimated: >60 mL/min (ref >60)
Glucose, Bld: 95 mg/dL (ref 70–99)
Potassium: 4.1 mmol/L (ref 3.5–5.1)
Sodium: 135 mmol/L (ref 135–145)

## 2022-09-30 LAB — MAGNESIUM: Magnesium: 1.3 mg/dL — ABNORMAL LOW (ref 1.7–2.4)

## 2022-09-30 MED ORDER — SODIUM CHLORIDE 0.9 % IV SOLN: INTRAVENOUS | Status: DC | PRN

## 2022-09-30 MED ORDER — MAGNESIUM SULFATE 4 GM/100ML IV SOLN
4.0000 g | Freq: Every day | INTRAVENOUS | Status: DC
  Administered 2022-09-30: 4 g via INTRAVENOUS
  Filled 2022-09-30 (×3): qty 100

## 2022-09-30 MED ORDER — MAGNESIUM OXIDE -MG SUPPLEMENT 400 (240 MG) MG PO TABS
400.0000 mg | ORAL_TABLET | Freq: Two times a day (BID) | ORAL | Status: DC
  Administered 2022-09-30: 400 mg via ORAL
  Filled 2022-09-30: qty 1

## 2022-09-30 MED ORDER — MAGNESIUM OXIDE -MG SUPPLEMENT 400 (240 MG) MG PO TABS
800.0000 mg | ORAL_TABLET | Freq: Two times a day (BID) | ORAL | Status: DC
  Administered 2022-09-30 – 2022-10-05 (×10): 800 mg via ORAL
  Filled 2022-09-30 (×10): qty 2

## 2022-09-30 MED ORDER — FAMOTIDINE 20 MG PO TABS
20.0000 mg | ORAL_TABLET | Freq: Two times a day (BID) | ORAL | Status: DC
  Administered 2022-09-30 – 2022-10-05 (×11): 20 mg via ORAL
  Filled 2022-09-30 (×11): qty 1

## 2022-09-30 NOTE — Progress Notes (Signed)
Recreational Therapy Session Note  Patient Details  Name: JAQUA MEAR MRN: [id number] Date of Birth: October 13, 1990 Today's Date: 09/30/2022  Pain: no c/o Skilled Therapeutic Interventions/Progress Updates:  Goal:  Pt will identify needed social/emotional supports for discharge and initiate obtaining resources through demonstrated self advocacy.  MET  Discussed upcoming discharge, preparedness for discharge including social and emotional needs in addition to physical. Pt stated some concern in his emotional health in returning home with the potential for flashbacks, nightmares.  Discussed a need and desire for follow up care.  Dr. Berline Chough present in the gym, and pt requesting psych follow up at discharge due to the above.  Vallory Oetken 09/30/2022, 4:15 PM

## 2022-09-30 NOTE — Progress Notes (Addendum)
Reinforced education on ileostomy maintenance/care. Patient stated he is able to empty bag with assistance. We reviewed steps x2 this shift on ileostomy care. Also, he was previously educated on ileostomy placement/emptying. Despite education he doesn't feel capable of changing out the entire ileostomy system. Per patient This is an emotional situation he expressed to this nurse that he has notified staff prior to today. POC in progress.    Shane Dome, LPN
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
## 2022-09-30 NOTE — Progress Notes (Signed)
Received a call from [facility], Mr Mcclenahan IV infiltrated and he is refusing his IV Magnesium infusion, she educated Mr Munns regarding his decision. He still refusing Magnesium infusion. Call placed to pharmacist , his po magnesium order was changed, we will check his magnesium level in the morning, call placed to [facility] regarding the above, she verbalizes understanding.

## 2022-09-30 NOTE — Progress Notes (Signed)
Physical Therapy Session Note  Patient Details  Name: Shane Klein MRN: [id number] Date of Birth: [birth year]  Today's Date: 09/30/2022 PT Individual Time: 1100-1220, 1320-1450  PT Individual Time Calculation (min): 80 min , 90 min   Short Term Goals: Week 4:  PT Short Term Goal 1 (Week 4): STG=LTG's due to ELOS  Skilled Therapeutic Interventions/Progress Updates:      Therapy Documentation Precautions:  Precautions Precautions: Fall, Other (comment) Precaution Comments: L JP drain, R colostomy/ileostomy, abdominal wound, R foot/ankle wound Required Braces or Orthoses: Other Brace Other Brace: B LE PRAFOs Restrictions Weight Bearing Restrictions: No RUE Weight Bearing: Weight bearing as tolerated RLE Weight Bearing: Weight bearing as tolerated LLE Weight Bearing: Weight bearing as tolerated Other Position/Activity Restrictions: RUE WBAT thru elbow only, no lifting > 5 lbs; no ROM restrictions  Treatment Session 1:   Pt received semi-reclined in bed, agreeable to PT session. PT notified nursing pt requires dressings following shower. Olivia from [place] arrived in session and provided pt with AFO's. Pt reports discomfort and Orthotist returned in second session to adjust and pt reported improvement and decreased pain. Pt requires CGA with stand pivot with PFRW to w/c. Pt wearing too large of pants and requires CGA -SBA for standing balance and mod A for lower body dressing for time management. In session, pt called [facility] [employer] and spoke with representative following up on home assessment for portable rental ramp. Plan for home assessment 6/7 between 1-5 and for ramp delivery on Monday prior to discharge on Tuesday. Pt left seated in w/c at bedside with all needs in reach.   Treatment Session 2:   Pt received semi-relined in bed with nurse present for ostomy management. Pt transported dependently for time management to main gym and pt agreeable to practice curb step. PT provided  visual demonstration and pt able to recall stepping strategy "up with the good, and down with the weak" from previous PT session. Pt requires mod A x 2 with curb step transfer x 4 largely for walker management and placement. Pt encouraged by progress. Orthotist arrived and made customized modifications to AFO and pt satisfied with outcome. Pt ambulated with new AFO's ~50 ft CGA and PT observed left hip hike with swing phase due to hip and knee flexor weakness. Pt transitioned to strengthening and performed left hip flexion x 4 with mirror as feedback to prevent excessive hip elevation as compensatory strategy for decreased foot clearance and weakness. Pt transported to room and CGA with stand pivot to bed and min A for sit to lying due to fatigue. Pt left semi-reclined in bed with all needs in reach. Pt with unrated foot pain that improved following AFO modification.   Therapy/Group: Individual Therapy  Truitt Leep Truitt Leep PT, DPT  09/30/2022, 7:44 AM

## 2022-09-30 NOTE — Progress Notes (Signed)
IVT consult placed for PIV- upon arrival to room pt adamant he did not want an IV started.  Primary RN to bedside, explains to patient the need and pt still insistent he does not want an IV at this time. Primary RN to place another consult should patient become agreeable in the future.

## 2022-09-30 NOTE — Progress Notes (Signed)
Occupational Therapy Session Note  Patient Details  Name: Shane Klein MRN: [id number] Date of Birth: [birth year]  Today's Date: 09/30/2022 OT Individual Time: 0900-1000 OT Individual Time Calculation (min): 60 min    Short Term Goals: Week 4:  OT Short Term Goal 1 (Week 4): STGS=LTGs due to patient's length of stay.  Skilled Therapeutic Interventions/Progress Updates:   Pt seen for skilled OT session this am with pt requesting full shower retraining with OT focus on standing tolerance and progressing independence with DME/AE and informal family education with mother. Pt bed level upon OT arrival and 6/10 pain reported in L LE with pain meds given by nursing within the visit for relief with OT emphasis on skin protection and positioning with heel floating reinforced. Pt self directed ostomy emptying with OT completing (see Flowsheets for details.). Pt moved from supine to sit to EOB with increased time and close S only. Use of STEDY frame for time mngt and to focus on standing skills with no seat pad use except over threshold of room increasing stand tolerance with each trial. OT needed to position L foot securely each time but sit to and from stand with CGA. OT waterproofed all areas incl drain, ostomy and abdominal incisions. Sitting balance on TTB improved to close S only once seated. Use of weight shifting and functiona reach in sitting for bathing all LB parts including peri and buttocks with CGA. UB with set up only including hair washing with functional R UE integration but significant weak grasp noted. Use of STEDY for dring in standing and transport to EOB. Stood and side stepped along bed with PFRW to L with min A for L LE placement. Min A for L LE mngt into bed. Handoff to NT for completion of care due to 60 min visit and nursing following for all dressing changes prior to clothing donning. Pain reported end of session 4/10 L L only with heel floating resumed.   Therapy  Documentation Precautions:  Precautions Precautions: Fall, Other (comment) Precaution Comments: L JP drain, R colostomy/ileostomy, abdominal wound, R foot/ankle wound Required Braces or Orthoses: Other Brace Other Brace: B LE PRAFOs Restrictions Weight Bearing Restrictions: No RUE Weight Bearing: Weight bearing as tolerated RLE Weight Bearing: Weight bearing as tolerated LLE Weight Bearing: Weight bearing as tolerated Other Position/Activity Restrictions: RUE WBAT thru elbow only, no lifting > 5 lbs; no ROM restrictions  Therapy/Group: Individual Therapy  Vicenta Dunning 09/30/2022, 7:53 AM
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
## 2022-09-30 NOTE — Progress Notes (Signed)
Physical Therapy Session Note  Patient Details  Name: Shane Klein MRN: [id number] Date of Birth: [birth year]  Today's Date: 09/30/2022 PT Individual Time: 4696-2952 PT Individual Time Calculation (min): 16 min  and Today's Date: 09/30/2022 PT Missed Time: 10 Minutes Missed Time Reason: Nursing care (ostomy change)  Short Term Goals: Week 3:  PT Short Term Goal 1 (Week 3): Pt will require min A for w/c<>mat transfer PT Short Term Goal 1 - Progress (Week 3): Met PT Short Term Goal 2 (Week 3): Pt will require min A for STS PT Short Term Goal 2 - Progress (Week 3): Met PT Short Term Goal 3 (Week 3): Pt will require mod A x 1 for gait x 10 ft with LRAD PT Short Term Goal 3 - Progress (Week 3): Met Week 4:  PT Short Term Goal 1 (Week 4): STG=LTG's due to ELOS  Skilled Therapeutic Interventions/Progress Updates:    Pt resting in bed on arrival and NT at bedside assisting with ostomy as it was leaking from dressing. RN called to bedside to assist. ~10 minutes spent changing ostomy and RN providing education on skin care and dressing change for pt with ostomy supplies. Pt wit low confident in ability to change dressing and encouragement provided to attempt portions of ostomy change in future. Pt spent remainder of session focusing on long sitting and circle sitting for lower and upper body dressing. Pt able to remove shirt in long sitting with CGA threading Rt UE out first to doff and then don clean shirt. Pt required cues to bring Rt LE into figure four sitting to doff pant leg. Pt using Rt foot to begin pulling down pant leg on Lt, cues for bil hand placement to assist with bringing Lt LE into figure four position to unthread pant leg from Lt and don new clean pant leg while still in figure four position. Pt then brought Rt LE back up to figure four/semi-circle sit to thread pant leg on Rt LE. Completed with light/CGA. EOS pt repositioned self in bed with good use of triceps to scoot posteriorly. EOS  remained in supine. Family at bedside, call bell within reach, and all needs met.  Therapy Documentation Precautions:  Precautions Precautions: Fall, Other (comment) Precaution Comments: L JP drain, R colostomy/ileostomy, abdominal wound, R foot/ankle wound Required Braces or Orthoses: Other Brace Other Brace: B LE PRAFOs Restrictions Weight Bearing Restrictions: No RUE Weight Bearing: Weight bearing as tolerated RLE Weight Bearing: Weight bearing as tolerated LLE Weight Bearing: Weight bearing as tolerated Other Position/Activity Restrictions: RUE WBAT thru elbow only, no lifting > 5 lbs; no ROM restrictions  Pain:  Pt reports some tightness in extremities with circle sitting/figure four but does not rate.   Therapy/Group: Individual Therapy   Wynn Maudlin, DPT Acute Rehabilitation Services Office [phone]  09/30/22 3:55 PM

## 2022-09-30 NOTE — Progress Notes (Addendum)
IV occluded and patient refused to continue infusion despite education provided. Notified on call provider Jacalyn Lefevre, PA. Awaiting order at this time, Notified oncoming nurse. New orders placed.     Tilden Dome, LPN

## 2022-10-01 DIAGNOSIS — F4320 Adjustment disorder, unspecified: Secondary | ICD-10-CM

## 2022-10-01 LAB — MAGNESIUM: Magnesium: 1.4 mg/dL — ABNORMAL LOW (ref 1.7–2.4)

## 2022-10-01 NOTE — Progress Notes (Signed)
Patient's ileostomy bag was completely changed again x1 before lunch (1145), again by charge nurse after lunch (1330) and x2 at this time by this nurse (1540). Stools are liquid and base is unable to stick to skin.

## 2022-10-01 NOTE — Progress Notes (Signed)
Patient states bag is leaking within 10 minutes of changing the bag again. Bag is noted to already be 1/4 full of liquid stool and continuing to come out at this time.Corners of base are saturated with liquid stool. Charge nurse notified to assist.

## 2022-10-01 NOTE — Progress Notes (Signed)
Occupational Therapy Session Note  Patient Details  Name: Shane Klein MRN: [id number] Date of Birth: [birth year]  Today's Date: 10/01/2022 OT Individual Time: 1435-1515 OT Individual Time Calculation (min): 40 min    Short Term Goals: Week 4:  OT Short Term Goal 1 (Week 4): STGS=LTGs due to patient's length of stay.  Skilled Therapeutic Interventions/Progress Updates:    Pt greeted semi-reclined in bed. Pt reported fatigue and declined OOB activities, but agreeable to UB there-ex. OT assessed UE strength and provided gentle stretching and ROM. OT made modified built up handles to yellow theraband for UB there-ex. PT completed 3 sets of 10 bicep curl, triceps press, chest pull,  and forearm pull with rest breaks in between. OT placed theraband on the foot of bed and practiced bicep curls independently. Pt demonstrated ability to reach and get the therabands to perform there-ex. Pt left semi-reclined in bed at end of session with alarm on, call bell in reach, and needs met.   Therapy Documentation Precautions:  Precautions Precautions: Fall, Other (comment) Precaution Comments: L JP drain, R colostomy/ileostomy, abdominal wound, R foot/ankle wound Required Braces or Orthoses: Other Brace Other Brace: B LE PRAFOs Restrictions Weight Bearing Restrictions: Yes RUE Weight Bearing: Weight bearing as tolerated RLE Weight Bearing: Weight bearing as tolerated LLE Weight Bearing: Weight bearing as tolerated Other Position/Activity Restrictions: RUE WBAT thru elbow only, no lifting > 5 lbs; no ROM restrictions  Pain:  Pt denies pain   Therapy/Group: Individual Therapy  Mal Amabile 10/01/2022, 3:05 PM

## 2022-10-01 NOTE — Progress Notes (Signed)
Attempted education several times this shift, patient states his hand prevents him from being able to care for his ileostomy bag. Asked patient about at-home arrangements.. states his mother will do the care. Patient's mother not present on this shift.

## 2022-10-01 NOTE — Progress Notes (Signed)
Patient ID: Shane Klein, male   DOB: 1991/04/04, 32 y.o.   MRN: [id number]  Gave letter Mom requested regarding pt's inability to work to pt. Also informed them contacted [employer] to find out when the hospital bed would be delivered and they will call Mom regarding this.

## 2022-10-01 NOTE — Progress Notes (Addendum)
Upon entering room, patient's ileostomy bag was off by side of patient's abdomen 75% full. Patient states he called roughly around 6:40am to state his bag was leaking. Nurse apologized of his wait. Patient states he has been "holding a towel there" to keep stool from "going everywhere". This nurse changed ileostomy bag and noticed stool around the midline wound, tape was noted to be loose around site. Stool noted to be on the wound itself. Cleansed wound, completed dressing changes. Patient states he is aware he is "supposed to be doing the changes", but states his hand is affected and he can only empty the bag. Nursing will continue to educate patient.

## 2022-10-01 NOTE — Progress Notes (Signed)
Physical Therapy Session Note  Patient Details  Name: Shane Klein MRN: [id number] Date of Birth: 03-Dec-1990  Today's Date: 10/01/2022 PT Individual Time: 1325-1425 PT Individual Time Calculation (min): 60 min   Short Term Goals: Week 4:  PT Short Term Goal 1 (Week 4): STG=LTG's due to ELOS  Skilled Therapeutic Interventions/Progress Updates:    Pt presents in room in bed, agreeable to PT. Pt denies pain in feet at this time. Therapist sets up room for transfer at beginning of session. Pt reporting needing to empty ostomy and while therapist was emptying noted ostomy leaking. RN notified who address during session, missing 15 min of therapy for nursing care. Session then focused on transfer training, gait training, bed mobility training. Pt completes sit<>stand transfers with CGA to RUE PFRW throughout session.  Following nursing care pt completes rolling in bed for doffing/donning new pants due to soiled from leaked ostomy. Rolling completed with verbal cues for technique, supervision with utilizing hospital bed rails. Pt completes supine to sit to R side of bed with supervision, increased time to complete and verbal cues for LUE hand placement. Therapist dons bilateral AFO and shoes total assist for time management, pt self directing care during donning.  Pt completes stand pivot transfer with RUE PFRW to WC and transported in [facility] to dayroom for gait training. Pt ambulates 41' with RUE PFRW, CGA with verbal cues to decreased compensatory hip hike with LLE with pt demonstrating improved initiation of hip/knee flexion. Pt demonstrates good BLE foot placement and postural stability, slow gait speed requiring increased time to complete ambulation.  Pt then transported back to room for time management and completes ambulatory transfer ~10' WC back to bed. Pt provided with education for sit to supine entering on L side of bed for compensatory strategies such as hooking RLE under LLE however continues  to require min assist for bringing LLE into bed and repositioning in supine. Pt remains supine in bed with all needs within reach, heels floating, call light in place at end of session.  Therapy Documentation Precautions:  Precautions Precautions: Fall, Other (comment) Precaution Comments: L JP drain, R colostomy/ileostomy, abdominal wound, R foot/ankle wound Required Braces or Orthoses: Other Brace Other Brace: B LE PRAFOs Restrictions Weight Bearing Restrictions: Yes RUE Weight Bearing: Weight bearing as tolerated RLE Weight Bearing: Weight bearing as tolerated LLE Weight Bearing: Weight bearing as tolerated Other Position/Activity Restrictions: RUE WBAT thru elbow only, no lifting > 5 lbs; no ROM restrictions   Therapy/Group: Individual Therapy  Edwin Cap PT, DPT 10/01/2022, 4:12 PM

## 2022-10-01 NOTE — Progress Notes (Addendum)
Occupational Therapy Session Note  Patient Details  Name: Shane Klein MRN: [id number] Date of Birth: 1990-12-24  Today's Date: 10/01/2022 OT Individual Time: 7829-5621 OT Individual Time Calculation (min): 70 min    Short Term Goals: Week 4:  OT Short Term Goal 1 (Week 4): STGS=LTGs due to patient's length of stay.  Skilled Therapeutic Interventions/Progress Updates:  Pt received resting in bed for skilled OT session with focus on functional transfers, DME, and discharge planning. Pt agreeable to interventions, demonstrating overall pleasant mood. Pt with intermediate R-hip pain while seated in WC. OT offering intermediate rest breaks and positioning suggestions throughout session to address pain/fatigue and maximize participation/safety in session.  Pt performs all functional transfers with CGA-close supervision + RW. Pt propels WC room-level distance, requiring transport remainder of way to ADL apartment for energy conservation. In ADL apartment, pt/pt's visitor Felipa Eth) and OT review options for TTB transfers (squat-pivot vs stand-step). Pt performs TTB transfer with Min A for LLE elevation from WC>TTB with stand-step technique + RW, performing squat-pivot back to [facility] with CGA-Min A. Options provided as patient feels most comfortable ambulating with AFO/shoes but is at times frustrated by the length of the donning process. Felipa Eth attentive throughout both types of transfers, caregiver education thus completed as she will be present to assist as needed upon discharge.   Pt ordered standard TTB for trial at home, planning to pad it with towels (simulated that above). Pt provided with hand-out to purchase padded TTB as own discretion.   Back in patient's room, pt empties ostomy with Min A for thoroughness. Felipa Eth (patient's visitor) then checked off for room-level transfers. RN/LPN updated.   Pt remained resting in bed with all immediate needs met at end of session. Pt continues to be  appropriate for skilled OT intervention to promote further functional independence.   Therapy Documentation Precautions:  Precautions Precautions: Fall, Other (comment) Precaution Comments: L JP drain, R colostomy/ileostomy, abdominal wound, R foot/ankle wound Required Braces or Orthoses: Other Brace Other Brace: B LE PRAFOs Restrictions Weight Bearing Restrictions: Yes RUE Weight Bearing: Weight bearing as tolerated RLE Weight Bearing: Weight bearing as tolerated LLE Weight Bearing: Weight bearing as tolerated Other Position/Activity Restrictions: RUE WBAT thru elbow only, no lifting > 5 lbs; no ROM restrictions   Therapy/Group: Individual Therapy  Lou Cal, OTR/L, MSOT  10/01/2022, 6:20 AM

## 2022-10-01 NOTE — Progress Notes (Signed)
Patient has IV Magnesium ordered but has no IV access. IV team consulted. Patient is refusing to have IV placed.
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
## 2022-10-02 LAB — BASIC METABOLIC PANEL
Anion gap: 11 (ref 5–15)
BUN: 13 mg/dL (ref 6–20)
CO2: 25 mmol/L (ref 22–32)
Calcium: 10 mg/dL (ref 8.9–10.3)
Chloride: 102 mmol/L (ref 98–111)
Creatinine, Ser: 0.63 mg/dL (ref 0.61–1.24)
GFR, Estimated: >60 mL/min (ref >60)
Glucose, Bld: 106 mg/dL — ABNORMAL HIGH (ref 70–99)
Potassium: 4.1 mmol/L (ref 3.5–5.1)
Sodium: 138 mmol/L (ref 135–145)

## 2022-10-02 LAB — MAGNESIUM: Magnesium: 1.2 mg/dL — ABNORMAL LOW (ref 1.7–2.4)

## 2022-10-02 MED ORDER — MAGNESIUM SULFATE 4 GM/100ML IV SOLN
4.0000 g | Freq: Once | INTRAVENOUS | Status: AC
  Administered 2022-10-02: 4 g via INTRAVENOUS
  Filled 2022-10-02: qty 100
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
## 2022-10-02 NOTE — Progress Notes (Signed)
Occupational Therapy Session Note  Patient Details  Name: SILVIANO NEUSER MRN: [id number] Date of Birth: May 17, 1990  {CHL IP REHAB OT TIME CALCULATIONS:[id number]}   Short Term Goals: Week 4:  OT Short Term Goal 1 (Week 4): STGS=LTGs due to patient's length of stay.  Skilled Therapeutic Interventions/Progress Updates:  Pt received *** for skilled OT session with focus on ***. Pt agreeable to interventions, demonstrating overall *** mood. Pt reported ***/10 pain, stating "***" in reference to ***. OT offering intermediate rest breaks and positioning suggestions throughout session to address pain/fatigue and maximize participation/safety in session.    Pt remained *** with all immediate needs met at end of session. Pt continues to be appropriate for skilled OT intervention to promote further functional independence.   Therapy Documentation Precautions:  Precautions Precautions: Fall, Other (comment) Precaution Comments: L JP drain, R colostomy/ileostomy, abdominal wound, R foot/ankle wound Required Braces or Orthoses: Other Brace Other Brace: B LE PRAFOs Restrictions Weight Bearing Restrictions: Yes RUE Weight Bearing: Weight bearing as tolerated RLE Weight Bearing: Weight bearing as tolerated LLE Weight Bearing: Weight bearing as tolerated Other Position/Activity Restrictions: RUE WBAT thru elbow only, no lifting > 5 lbs; no ROM restrictions   Therapy/Group: Individual Therapy  Lou Cal, OTR/L, MSOT  10/02/2022, 12:44 PM

## 2022-10-02 NOTE — Progress Notes (Signed)
Set up station at bedside with supplies for emptying pouch. Patient able to empty pouch on his own 3 times this shift, calling staff only to empty container when completed. Provided patient with clean ostomy set to practice snapping on and pulling off pouch.

## 2022-10-02 NOTE — Progress Notes (Signed)
Physical Therapy Session Note  Patient Details  Name: Shane Klein MRN: [id number] Date of Birth: Nov 26, 1990  Today's Date: 10/02/2022 PT Individual Time: 0915-1023 PT Individual Time Calculation (min): 68 min   Short Term Goals: Week 4:  PT Short Term Goal 1 (Week 4): STG=LTG's due to ELOS  Skilled Therapeutic Interventions/Progress Updates: Pt presents supine in bed and initially refusing PT but agreeable once sitting EOB.  Pt transfers sup to sit w/ supervision and increased time.  PT performed HC stretch prior to donning AFOs and shoes.  Pt transfers sit to stand w/ CGA to platform walker.  Pt amb x 40' w/ CGA into hallway before requiring seated rest break.  Pt amb w/ improved swing through on LLE, but pt aware when hip-hiking to advance.  Pt wheeled to main gym and sit to stad and step-pivot w/ platform walker to mat table.  Loose brake fixed on w/c.  Pt amb through cone obstacle course w/ CGA.  Cues given for increased swing through L foot as well as improved BOS w/ turns, especially to left.  Pt performed obstacle course x 3 w/ seated brakes between trials.  Pt returned to room and remained sitting in w/c w/ chair alarm on and all needs in reach.     Therapy Documentation Precautions:  Precautions Precautions: Fall, Other (comment) Precaution Comments: L JP drain, R colostomy/ileostomy, abdominal wound, R foot/ankle wound Required Braces or Orthoses: Other Brace Other Brace: B LE PRAFOs Restrictions Weight Bearing Restrictions: Yes RUE Weight Bearing: Weight bearing as tolerated RLE Weight Bearing: Weight bearing as tolerated LLE Weight Bearing: Weight bearing as tolerated Other Position/Activity Restrictions: RUE WBAT thru elbow only, no lifting > 5 lbs; no ROM restrictions General:   Vital Signs:  Pain:7/10 B LES Pain Assessment Pain Scale: 0-10 Pain Score: 8  Pain Type: Acute pain Pain Location: Leg Pain Orientation: Left;Right Pain Descriptors / Indicators:  Aching Pain Frequency: Constant Pain Intervention(s): Medication (See eMAR)     Therapy/Group: Individual Therapy  Lucio Edward 10/02/2022, 10:25 AM

## 2022-10-03 LAB — MAGNESIUM: Magnesium: 1.7 mg/dL (ref 1.7–2.4)

## 2022-10-03 MED ORDER — BANATROL TF EN LIQD
60.0000 mL | Freq: Three times a day (TID) | ENTERAL | Status: DC
  Filled 2022-10-03 (×2): qty 60

## 2022-10-03 NOTE — Progress Notes (Addendum)
Patient setting alarm to monitor ostomy this morning and continues to empty/work with fiance to empty. Has independently managed emptying bag entire shift including timing and task, staff only assists with measuring and emptying and restocking bedside supplies.   Patient demonstrated ability to snap on new pouch including closing bottom tabs and pinching ring in locked position. Missed one small section on the underside on first attempt but will continue practicing.   Fiance performed abdominal dressing change this morning with supervision/cues and verbalizes confidence in procedure at home.

## 2022-10-03 NOTE — Progress Notes (Addendum)
Nutrition Follow-up  DOCUMENTATION CODES:   Non-severe (moderate) malnutrition in context of chronic illness  INTERVENTION:  - RD recommends patient consumes a banana daily  -trial banatrol soluble fiber TID to help bulk stool in addition to fibercon TID    - RD recommends patient limit the amount of insoluble fiber/roughage  in his diet which can contribute to high ostomy output and slowly reintroduce them 1-by-1 to create better tolerance  --RD recommends SOFT (GI Soft/low fiber) diet    Examples of food high in insoluble fiber include but are not limited to: leafy green vegetables, nuts, seeds, prunes, whole grains, beans, fruits with skin that can be eaten such as grapes and apples  NUTRITION DIAGNOSIS:   Moderate Malnutrition related to chronic illness as evidenced by moderate muscle depletion, moderate fat depletion.  GOAL:   Patient will meet greater than or equal to 90% of their needs   MONITOR:   PO intake, Supplement acceptance, Skin  REASON FOR ASSESSMENT:   Consult Poor PO, Other (Comment) (high ostomy output)  ASSESSMENT:   32 y.o. male admits to CIR related to functional deficits secondary to spinal cord injury due to fragments in the spinal canal at T12 and L2. PMH includes: acute radial nerve palsy, right supracondylar humerus fracture.  RD attempted to reach patient by phone, but no answer.   Chart reviewed:   Patient was seen on 6/5 by RD where education on high ostomy output was discussed. Ostomy output seems to vary daily but still seems quite high. He has been eating much better this weekend as well, as RD did note that he was having poor po intake a few days ago. Patient has been learning to change ostomy output while also coping with PTSD. RD suspects these factors affect his po intake. Patient has recently been emptying his own pouch and is in good spirits per neuro psych.  Labs: Glu 106 Meds: Vitamin C, aspirin, eliquis, vitamin D3, vitamin B12,  lomotil, pepcid, imodium, mag-ox, MVI, fibercon Wt:  09/21/22 1800 59.3 kg 130.73 lbs  09/08/22 0847 43 kg 94.8 lbs  09/04/22 0544 71 kg 156.53 lbs  09/03/22 1404 71 kg 156.53 lbs   PO: 100% x last 3 documented meals  I/O's:  -20 L (since admission)  Diet Order:   Diet Order             Diet regular Room service appropriate? Yes; Fluid consistency: Thin  Diet effective now                   EDUCATION NEEDS:   Education needs have been addressed  Skin:  Skin Integrity Issues:: Stage II, Stage III DTI: R arm Stage II: R ankle; left tibial; Stage III: Mid buttocks Incisions: back; abdomen; left hip Other: R wrist  Last BM:  6/9 via ostomy, output recorded on 6/8  Height:   Ht Readings from Last 1 Encounters:  09/03/22 5\' 10"  (1.778 m)    Weight:   Wt Readings from Last 1 Encounters:  09/21/22 59.3 kg    Ideal Body Weight:     BMI:  Body mass index is 18.76 kg/m.  Estimated Nutritional Needs:   Kcal:  2200-2500 kcals  Protein:  110-130 gm  Fluid:  >/= 2 L/day  Leodis Rains, RDN, LDN  Clinical Nutrition
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
## 2022-10-03 NOTE — Progress Notes (Signed)
Physical Therapy Session Note  Patient Details  Name: Shane Klein MRN: [id number] Date of Birth: 10-08-1990  Today's Date: 10/03/2022 PT Individual Time: 1417-1530 PT Individual Time Calculation (min): 73 min   Short Term Goals: Week 4:  PT Short Term Goal 1 (Week 4): STG=LTG's due to ELOS  Skilled Therapeutic Interventions/Progress Updates: Pt presents sitting EOB eating snack but eager for therapy.  PT donned shoes and AFOs w/ total A.  Pt emptied colostomy bag w/ set-up x 350 ml, to be charted by NT.  Pt transferring w/ CGA, cues for controlled sit <> stands for safety.  Pt w/o cueing need for sequencing for transfers.  Pt wheeled self x 150' to dayroom w/ supervision.  Pt amb to Nu-step w/ CGA and performed x 4' at Level 2 for LES only, and then 3' at Level 1 w/ cues for full ROM short of full extension, controlled .  Pt amb x 90' w/ platform walker and CGA, verbal cues for upright posture and L knee flexion w/ initiation of swing phase.  Pt does tend to hip-hike or some circumduction for LLE advancement.  Pt performed short walk to sedan height car simulator (23 inches height) w/ CGA but cues for safe approach.  Pt transfers in/out w/ CGA only, pt able to bring RLE into car w/o UE assist.  Pt amb from hallway into room and to bed w/ CGA.  PT doffs AFOs and shoes and pt transfers sit to supine w/ supervision, UE assist fro LLE.  All needs in reach.  Steffanie Rainwater' present for gait and transfers.     Therapy Documentation Precautions:  Precautions Precautions: Fall, Other (comment) Precaution Comments: L JP drain, R colostomy/ileostomy, abdominal wound, R foot/ankle wound Required Braces or Orthoses: Other Brace Other Brace: B LE PRAFOs Restrictions Weight Bearing Restrictions: Yes RUE Weight Bearing: Weight bearing as tolerated RLE Weight Bearing: Weight bearing as tolerated LLE Weight Bearing: Weight bearing as tolerated Other Position/Activity Restrictions: RUE WBAT thru elbow only, no  lifting > 5 lbs; no ROM restrictions General:   Vital Signs: Therapy Vitals Temp: 98.3 F (36.8 C) Temp Source: Oral Pulse Rate: 87 Resp: 16 BP: 116/70 Patient Position (if appropriate): Lying Oxygen Therapy SpO2: 99 % O2 Device: Room Air Pain:0/10      Therapy/Group: Individual Therapy  Lucio Edward 10/03/2022, 4:07 PM

## 2022-10-04 ENCOUNTER — Telehealth (HOSPITAL_COMMUNITY): Payer: Self-pay | Admitting: Pharmacy Technician

## 2022-10-04 ENCOUNTER — Other Ambulatory Visit (HOSPITAL_COMMUNITY): Payer: Self-pay

## 2022-10-04 LAB — COMPREHENSIVE METABOLIC PANEL
ALT: 93 U/L — ABNORMAL HIGH (ref 0–44)
AST: 59 U/L — ABNORMAL HIGH (ref 15–41)
Albumin: 2.4 g/dL — ABNORMAL LOW (ref 3.5–5.0)
Alkaline Phosphatase: 119 U/L (ref 38–126)
Anion gap: 7 (ref 5–15)
BUN: 14 mg/dL (ref 6–20)
CO2: 24 mmol/L (ref 22–32)
Calcium: 10.2 mg/dL (ref 8.9–10.3)
Chloride: 103 mmol/L (ref 98–111)
Creatinine, Ser: 0.71 mg/dL (ref 0.61–1.24)
GFR, Estimated: >60 mL/min (ref >60)
Glucose, Bld: 84 mg/dL (ref 70–99)
Potassium: 4.3 mmol/L (ref 3.5–5.1)
Sodium: 134 mmol/L — ABNORMAL LOW (ref 135–145)
Total Bilirubin: 0.3 mg/dL (ref 0.3–1.2)
Total Protein: 7.5 g/dL (ref 6.5–8.1)

## 2022-10-04 LAB — MAGNESIUM: Magnesium: 1.2 mg/dL — ABNORMAL LOW (ref 1.7–2.4)

## 2022-10-04 LAB — CBC WITH DIFFERENTIAL/PLATELET
Abs Immature Granulocytes: 0.01 10*3/uL (ref 0.00–0.07)
Basophils Absolute: 0 10*3/uL (ref 0.0–0.1)
Basophils Relative: 1 %
Eosinophils Absolute: 0.4 10*3/uL (ref 0.0–0.5)
Eosinophils Relative: 7 %
HCT: 26.8 % — ABNORMAL LOW (ref 39.0–52.0)
Hemoglobin: 8.5 g/dL — ABNORMAL LOW (ref 13.0–17.0)
Immature Granulocytes: 0 %
Lymphocytes Relative: 25 %
Lymphs Abs: 1.3 10*3/uL (ref 0.7–4.0)
MCH: 26.5 pg (ref 26.0–34.0)
MCHC: 31.7 g/dL (ref 30.0–36.0)
MCV: 83.5 fL (ref 80.0–100.0)
Monocytes Absolute: 0.9 10*3/uL (ref 0.1–1.0)
Monocytes Relative: 17 %
Neutro Abs: 2.7 10*3/uL (ref 1.7–7.7)
Neutrophils Relative %: 50 %
Platelets: 648 10*3/uL — ABNORMAL HIGH (ref 150–400)
RBC: 3.21 MIL/uL — ABNORMAL LOW (ref 4.22–5.81)
RDW: 16.3 % — ABNORMAL HIGH (ref 11.5–15.5)
WBC: 5.3 10*3/uL (ref 4.0–10.5)
nRBC: 0 % (ref 0.0–0.2)

## 2022-10-04 MED ORDER — BACLOFEN 5 MG PO TABS
5.0000 mg | ORAL_TABLET | Freq: Three times a day (TID) | ORAL | 0 refills | Status: DC
  Filled 2022-10-04: qty 90, 30d supply, fill #0

## 2022-10-04 MED ORDER — OXYCODONE HCL 5 MG PO TABS: 10.0000 mg | ORAL_TABLET | ORAL | Status: DC | PRN

## 2022-10-04 MED ORDER — OXYCODONE HCL ER 20 MG PO T12A: 60.0000 mg | EXTENDED_RELEASE_TABLET | Freq: Two times a day (BID) | ORAL | 0 refills | Status: DC

## 2022-10-04 MED ORDER — CYPROHEPTADINE HCL 4 MG PO TABS
4.0000 mg | ORAL_TABLET | Freq: Every day | ORAL | 0 refills | Status: DC
  Filled 2022-10-04: qty 30, 30d supply, fill #0

## 2022-10-04 MED ORDER — DICLOFENAC SODIUM 1 % EX GEL
2.0000 g | Freq: Four times a day (QID) | CUTANEOUS | 0 refills | Status: DC
  Filled 2022-10-04: qty 200, 30d supply, fill #0

## 2022-10-04 MED ORDER — DULOXETINE HCL 60 MG PO CPEP
120.0000 mg | ORAL_CAPSULE | Freq: Every day | ORAL | 0 refills | Status: DC
  Filled 2022-10-04: qty 60, 30d supply, fill #0

## 2022-10-04 MED ORDER — APIXABAN 5 MG PO TABS
5.0000 mg | ORAL_TABLET | Freq: Two times a day (BID) | ORAL | 0 refills | Status: DC
  Filled 2022-10-04: qty 60, 30d supply, fill #0

## 2022-10-04 MED ORDER — CHOLESTYRAMINE 4 G PO PACK
4.0000 g | PACK | Freq: Every day | ORAL | 0 refills | Status: DC
  Filled 2022-10-04: qty 60, 60d supply, fill #0

## 2022-10-04 MED ORDER — FIBER 625 MG PO TABS
1250.0000 mg | ORAL_TABLET | Freq: Three times a day (TID) | ORAL | 0 refills | Status: DC
  Filled 2022-10-04 – 2023-03-08 (×6): qty 180, 30d supply, fill #0

## 2022-10-04 MED ORDER — BUSPIRONE HCL 10 MG PO TABS
15.0000 mg | ORAL_TABLET | Freq: Three times a day (TID) | ORAL | 0 refills | Status: DC
  Filled 2022-10-04: qty 135, 30d supply, fill #0

## 2022-10-04 MED ORDER — VITAMIN D3 25 MCG PO TABS
2000.0000 [IU] | ORAL_TABLET | Freq: Two times a day (BID) | ORAL | 0 refills | Status: DC
  Filled 2022-10-04: qty 60, 15d supply, fill #0

## 2022-10-04 MED ORDER — TEMAZEPAM 15 MG PO CAPS
15.0000 mg | ORAL_CAPSULE | Freq: Every day | ORAL | 0 refills | Status: DC
  Filled 2022-10-04: qty 30, 30d supply, fill #0

## 2022-10-04 MED ORDER — CYANOCOBALAMIN 1000 MCG PO TABS
1000.0000 ug | ORAL_TABLET | Freq: Every day | ORAL | 0 refills | Status: DC
  Filled 2022-10-04: qty 30, 30d supply, fill #0

## 2022-10-04 MED ORDER — ACETAMINOPHEN 325 MG PO TABS: 325.0000 mg | ORAL_TABLET | Freq: Four times a day (QID) | ORAL | Status: DC | PRN

## 2022-10-04 MED ORDER — MAGNESIUM SULFATE 4 GM/100ML IV SOLN
4.0000 g | Freq: Once | INTRAVENOUS | Status: AC
  Administered 2022-10-04: 4 g via INTRAVENOUS
  Filled 2022-10-04: qty 100

## 2022-10-04 MED ORDER — FLUDROCORTISONE ACETATE 0.1 MG PO TABS
0.1000 mg | ORAL_TABLET | Freq: Every day | ORAL | 0 refills | Status: DC
  Filled 2022-10-04: qty 30, 30d supply, fill #0

## 2022-10-04 MED ORDER — OXYCODONE HCL 10 MG PO TABS
10.0000 mg | ORAL_TABLET | ORAL | 0 refills | Status: DC | PRN
  Filled 2022-10-04: qty 30, 5d supply, fill #0

## 2022-10-04 MED ORDER — TEMAZEPAM 15 MG PO CAPS: 15.0000 mg | ORAL_CAPSULE | Freq: Every day | ORAL | 0 refills | Status: DC

## 2022-10-04 MED ORDER — DOXAZOSIN MESYLATE 2 MG PO TABS
2.0000 mg | ORAL_TABLET | Freq: Two times a day (BID) | ORAL | 0 refills | Status: DC
  Filled 2022-10-04: qty 60, 30d supply, fill #0

## 2022-10-04 MED ORDER — OXYCODONE HCL ER 20 MG PO T12A
60.0000 mg | EXTENDED_RELEASE_TABLET | Freq: Two times a day (BID) | ORAL | 0 refills | Status: DC
  Filled 2022-10-04: qty 42, 7d supply, fill #0

## 2022-10-04 MED ORDER — MELATONIN 3 MG PO TABS
3.0000 mg | ORAL_TABLET | Freq: Every day | ORAL | 0 refills | Status: DC
  Filled 2022-10-04: qty 60, 60d supply, fill #0

## 2022-10-04 MED ORDER — METHOCARBAMOL 500 MG PO TABS
1000.0000 mg | ORAL_TABLET | Freq: Four times a day (QID) | ORAL | 0 refills | Status: DC
  Filled 2022-10-04: qty 180, 23d supply, fill #0

## 2022-10-04 MED ORDER — GABAPENTIN 400 MG PO CAPS
1200.0000 mg | ORAL_CAPSULE | Freq: Three times a day (TID) | ORAL | 0 refills | Status: DC
  Filled 2022-10-04: qty 180, 20d supply, fill #0

## 2022-10-04 MED ORDER — LOPERAMIDE HCL 2 MG PO CAPS
4.0000 mg | ORAL_CAPSULE | ORAL | 0 refills | Status: DC | PRN
  Filled 2022-10-04: qty 30, 15d supply, fill #0

## 2022-10-04 MED ORDER — FERROUS SULFATE 325 (65 FE) MG PO TABS
325.0000 mg | ORAL_TABLET | Freq: Two times a day (BID) | ORAL | 0 refills | Status: DC
  Filled 2022-10-04: qty 100, 50d supply, fill #0

## 2022-10-04 MED ORDER — ASPIRIN 81 MG PO TBEC: 81.0000 mg | DELAYED_RELEASE_TABLET | Freq: Every day | ORAL | 12 refills | Status: DC

## 2022-10-04 MED ORDER — ASCORBIC ACID 1000 MG PO TABS
1000.0000 mg | ORAL_TABLET | Freq: Every day | ORAL | 0 refills | Status: DC
  Filled 2022-10-04: qty 100, 100d supply, fill #0

## 2022-10-04 MED ORDER — FAMOTIDINE 20 MG PO TABS
20.0000 mg | ORAL_TABLET | Freq: Two times a day (BID) | ORAL | 0 refills | Status: DC
  Filled 2022-10-04: qty 60, 30d supply, fill #0

## 2022-10-04 MED ORDER — MAGNESIUM OXIDE -MG SUPPLEMENT 400 (240 MG) MG PO TABS
800.0000 mg | ORAL_TABLET | Freq: Two times a day (BID) | ORAL | 0 refills | Status: DC
  Filled 2022-10-04: qty 120, 30d supply, fill #0

## 2022-10-04 NOTE — Progress Notes (Signed)
Have ostomy supplies for home.  High output pouches not in stock.  [facility] to bring 2 up in the AM. Also included supplies and instructions for wounds to midline, right ankle, and left posterior calf.  Right ankle and calf wounds can be left open to air unless irritated by clothing and/or shoes.  Same with sacrum.  Ostomy supplies are for 2 piece pouch system. Notified Clydie Braun with [facility] that hospital is out of ostomy [place] [id number] which is draining pouch.  Will order (1)foley drain bag.  Also provided cylinder to measure output and log to record output. Notified Brooke and gave her home supplies with instructions and gave to patient. Photos of all wounds/incisions in chart for discharge.

## 2022-10-04 NOTE — Progress Notes (Signed)
Notified on call provider Dois Davenport, [place] that patient is requesting ground pass. No new orders. Educated patient that ground pass is not ordered and advised patient and family member about safety. Patient and family in agreement.

## 2022-10-04 NOTE — Plan of Care (Signed)
  Problem: RH Balance Goal: LTG: Patient will maintain dynamic sitting balance (OT) Description: LTG:  Patient will maintain dynamic sitting balance with assistance during activities of daily living (OT) Outcome: Completed/Met   Problem: Sit to Stand Goal: LTG:  Patient will perform sit to stand in prep for activites of daily living with assistance level (OT) Description: LTG:  Patient will perform sit to stand in prep for activites of daily living with assistance level (OT) Outcome: Completed/Met   Problem: RH Eating Goal: LTG Patient will perform eating w/assist, cues/equip (OT) Description: LTG: Patient will perform eating with assist, with/without cues using equipment (OT) Outcome: Completed/Met   Problem: RH Grooming Goal: LTG Patient will perform grooming w/assist,cues/equip (OT) Description: LTG: Patient will perform grooming with assist, with/without cues using equipment (OT) Outcome: Completed/Met   Problem: RH Bathing Goal: LTG Patient will bathe all body parts with assist levels (OT) Description: LTG: Patient will bathe all body parts with assist levels (OT) Outcome: Completed/Met   Problem: RH Dressing Goal: LTG Patient will perform upper body dressing (OT) Description: LTG Patient will perform upper body dressing with assist, with/without cues (OT). Outcome: Completed/Met Goal: LTG Patient will perform lower body dressing w/assist (OT) Description: LTG: Patient will perform lower body dressing with assist, with/without cues in positioning using equipment (OT) Outcome: Completed/Met   Problem: RH Toileting Goal: LTG Patient will perform toileting task (3/3 steps) with assistance level (OT) Description: LTG: Patient will perform toileting task (3/3 steps) with assistance level (OT)  Outcome: Completed/Met   Problem: RH Functional Use of Upper Extremity Goal: LTG Patient will use RT/LT upper extremity as a (OT) Description: LTG: Patient will use right/left upper  extremity as a stabilizer/gross assist/diminished/nondominant/dominant level with assist, with/without cues during functional activity (OT) Outcome: Completed/Met   Problem: RH Tub/Shower Transfers Goal: LTG Patient will perform tub/shower transfers w/assist (OT) Description: LTG: Patient will perform tub/shower transfers with assist, with/without cues using equipment (OT) Outcome: Completed/Met

## 2022-10-04 NOTE — Progress Notes (Signed)
Inpatient Rehabilitation Discharge Medication Review by a Pharmacist  A complete drug regimen review was completed for this patient to identify any potential clinically significant medication issues.  High Risk Drug Classes Is patient taking? Indication by Medication  Antipsychotic No   Anticoagulant Yes Apixaban - SVC thrombus  Antibiotic No   Opioid Yes Oxycodone - pain   Antiplatelet No   Hypoglycemics/insulin No   Vasoactive Medication Yes Doxazosin - PTSD  Fludrocortisone - Hypotension  Chemotherapy No   Other Yes Baclofen, methocarbamol - muscle spasms  Buspirone - anxiety Cholestyramine - diarrhea Cyproheptadine-appetite  Famotidine - Reflux Temazepam - sleep  Gabapentin, Duloxetine - neuropathy     Type of Medication Issue Identified Description of Issue Recommendation(s)  Drug Interaction(s) (clinically significant)     Duplicate Therapy     Allergy     No Medication Administration End Date     Incorrect Dose     Additional Drug Therapy Needed     Significant med changes from prior encounter (inform family/care partners about these prior to discharge).    Other       Clinically significant medication issues were identified that warrant physician communication and completion of prescribed/recommended actions by midnight of the next day:  No  Pharmacist comments: None  Time spent performing this drug regimen review (minutes):  20 minutes  Thank you Okey Regal, PharmD
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
## 2022-10-04 NOTE — Telephone Encounter (Signed)
Patient Advocate Encounter  Prior Authorization for Temazepam 15MG  capsules has been approved.    PA# [id number] Key: [id number] Insurance [employer] [place] Electronic [place] Form Effective dates: 10/04/2022 through 04/02/2023     Roland Earl, CPhT Pharmacy Patient Advocate Specialist [facility] Patient Advocate Team Direct Number: [phone]  Fax: [phone]

## 2022-10-04 NOTE — TOC Transition Note (Signed)
TOC discharge medications READY and LOCATED in TOC pharmacy on 2nd floor awaiting pt discharge.  

## 2022-10-04 NOTE — Progress Notes (Signed)
Occupational Therapy Discharge Summary  Patient Details  Name: Shane Klein MRN: [id number] Date of Birth: [birth year]  Date of Discharge from OT service: October 04, 2022  Today's Date: 10/04/2022 OT Individual Time: 1011-1030 OT Individual Time Calculation (min): 19 min  and Today's Date: 10/04/2022 OT Missed Time: 26 Minutes Missed Time Reason: Patient fatigue  Today's Date: 10/04/2022 OT Individual Time: 2130-8657 OT Individual Time Calculation (min): 71 min    Patient has met 10 of 10 long term goals due to improved activity tolerance, improved balance, postural control, ability to compensate for deficits, functional use of  RIGHT upper, RIGHT lower, and LEFT lower extremity, and improved awareness.  Patient to discharge at overall Supervision level.  Patient's care partner is independent to provide the necessary physical assistance at discharge.    Recommendation:  Patient will benefit from ongoing skilled OT services in outpatient setting to continue to advance functional skills in the area of BADL, iADL, Vocation, and Reduce care partner burden.  Equipment: TTB  Reasons for discharge: treatment goals met and discharge from hospital  Patient/family agrees with progress made and goals achieved: Yes  OT Discharge Precautions/Restrictions  Precautions Precautions: Fall;Other (comment) Precaution Comments: R colostomy/ileostomy Required Braces or Orthoses: Other Brace Other Brace: BLE AFOs Restrictions Weight Bearing Restrictions: Yes RUE Weight Bearing: Weight bear through elbow only RLE Weight Bearing: Weight bearing as tolerated LLE Weight Bearing: Weight bearing as tolerated Other Position/Activity Restrictions: RUE WBAT thru elbow only, no lifting > 5 lbs; no ROM restrictions Pain Pain Assessment Pain Scale: 0-10 Pain Score: 7  Pain Type: Acute pain Pain Location: Leg Pain Orientation: Right Pain Descriptors / Indicators: Aching Pain Onset: With  Activity Patients Stated Pain Goal: 0 Pain Intervention(s): Repositioned Multiple Pain Sites: No ADL ADL Equipment Provided: Reacher, Long-handled sponge Eating: Modified independent Where Assessed-Eating: Bed level Grooming: Modified independent Where Assessed-Grooming: Sitting at sink, Bed level Upper Body Bathing: Modified independent Where Assessed-Upper Body Bathing: Sitting at sink, Shower Lower Body Bathing: Supervision/safety Where Assessed-Lower Body Bathing: Sitting at sink, Water quality scientist Dressing: Modified independent (Device) Where Assessed-Upper Body Dressing: Edge of bed, Wheelchair Lower Body Dressing: Supervision/safety Where Assessed-Lower Body Dressing: Edge of bed, Wheelchair Toileting: Modified independent Where Assessed-Toileting: Other (Comment) (Urinal) Toilet Transfer:  (Ostomy/Urinal) Toilet Transfer Method: Not assessed Tub/Shower Transfer: Close supervison Web designer Method: Squat pivot, Stand pivot Tub/Shower Equipment: Emergency planning/management officer Vision Baseline Vision/History: 0 No visual deficits Patient Visual Report: No change from baseline Vision Assessment?: No apparent visual deficits Perception  Perception: Within Functional Limits Praxis Praxis: Intact Cognition Cognition Overall Cognitive Status: Within Functional Limits for tasks assessed Arousal/Alertness: Awake/alert Orientation Level: Situation;Place;Person Memory: Appears intact Safety/Judgment: Appears intact Brief Interview for Mental Status (BIMS) Repetition of Three Words (First Attempt): 3 Temporal Orientation: Year: Correct Temporal Orientation: Month: Accurate within 5 days Temporal Orientation: Day: Correct Recall: "Sock": Yes, no cue required Recall: "Blue": Yes, no cue required Recall: "Bed": Yes, no cue required BIMS Summary Score: 15 Sensation Sensation Light Touch: Impaired by gross assessment Peripheral sensation comments: Increased sensitivty in  B-feet, some numbness present in R-hand. Light Touch Impaired Details: Impaired RLE;Impaired LLE;Impaired RUE Hot/Cold: Appears Intact Proprioception: Appears Intact Coordination Gross Motor Movements are Fluid and Coordinated: No Fine Motor Movements are Fluid and Coordinated: No Coordination and Movement Description: Decreased due to generalized weakness. Motor  Motor Motor: Paraplegia;Abnormal postural alignment and control Motor - Skilled Clinical Observations: Paraplegia L LE weaker than R LE; R UE weakness/decreased ROM due to injury  Mobility  Bed Mobility Bed Mobility: Rolling Right;Rolling Left;Supine to Sit;Sit to Supine Rolling Right: Independent Rolling Left: Independent Supine to Sit: Independent with assistive device Sit to Supine: Independent with assistive device  Trunk/Postural Assessment  Cervical Assessment Cervical Assessment: Exceptions to [facility] (Forward head) Thoracic Assessment Thoracic Assessment: Exceptions to [facility] (Rounded shoulders) Lumbar Assessment Lumbar Assessment: Exceptions to [facility] (Posterior pelvic tilt.) Postural Control Postural Control: Deficits on evaluation Righting Reactions: delayed and insufficient  Balance Static Sitting Balance Static Sitting - Balance Support: Feet supported Static Sitting - Level of Assistance: 6: Modified independent (Device/Increase time) Dynamic Sitting Balance Dynamic Sitting - Balance Support: During functional activity Dynamic Sitting - Level of Assistance: 5: Stand by assistance Dynamic Sitting - Balance Activities: Lateral lean/weight shifting;Forward lean/weight shifting Static Standing Balance Static Standing - Balance Support: Bilateral upper extremity supported Static Standing - Level of Assistance: 5: Stand by assistance (Close supervision) Dynamic Standing Balance Dynamic Standing - Balance Support: During functional activity Dynamic Standing - Level of Assistance: 5: Stand by assistance  (CGA) Extremity/Trunk Assessment RUE Assessment RUE Assessment: Exceptions to [facility] General Strength Comments: Radial nerve injury, elbow flex/ext limited, shoulder 3-/5, limited opposition of 2nd digit. LUE Assessment LUE Assessment: Within Functional Limits  Session 1: Pt received resting in bed for skilled OT session with focus on caregiver education and discharge planning. Pt greatly fatigued, declining OOB activity this session.   Pt's mother and OT review TTB transfer and DME recommendations. OT simulates transfer, reviewing current level of ADL functioning. Plan to re-educate in later session. Pt's mother receives call from [facility] for purchase of ramp, to be installed later this day.   Pt remained resting in with all immediate needs met at end of session. Pt continues to be appropriate for skilled OT intervention to promote further functional independence.   Session 2: Pt received sitting EOB eating lunch for skilled OT session with focus on BADL participation. Pt agreeable to interventions, demonstrating overall pleasant mood. Pt with no reports of pain. OT offering intermediate rest breaks and positioning suggestions throughout session to address potential pain/fatigue and maximize participation/safety in session.   Pt doffs/dons clothing garments with overall supervision + use of reacher, standing to manage as needed with close supervision + RW. Pt performs stand-step transfer from EOB<>WC,  squat-pivot transfer from WC<>TTB, all with close supervision-CGA. Pt completes full-body shower with assistance to water-proof wounds/ostomy.   Pt dresses at EOB with similar levels of assistance, further re-iteration of education completed with focused placed on TTB transfer and LB dressing.   Pt remained resting in bed with all immediate needs met at end of session. Pt continues to be appropriate for skilled OT intervention to promote further functional independence.   Lou Cal, OTR/L, MSOT  10/04/2022, 11:21 AM

## 2022-10-04 NOTE — Plan of Care (Deleted)
  Problem: RH Balance Goal: LTG: Patient will maintain dynamic sitting balance (OT) Description: LTG:  Patient will maintain dynamic sitting balance with assistance during activities of daily living (OT) Outcome: Completed/Met   Problem: Sit to Stand Goal: LTG:  Patient will perform sit to stand in prep for activites of daily living with assistance level (OT) Description: LTG:  Patient will perform sit to stand in prep for activites of daily living with assistance level (OT) Outcome: Completed/Met   Problem: RH Eating Goal: LTG Patient will perform eating w/assist, cues/equip (OT) Description: LTG: Patient will perform eating with assist, with/without cues using equipment (OT) Outcome: Completed/Met   Problem: RH Grooming Goal: LTG Patient will perform grooming w/assist,cues/equip (OT) Description: LTG: Patient will perform grooming with assist, with/without cues using equipment (OT) Outcome: Completed/Met   Problem: RH Bathing Goal: LTG Patient will bathe all body parts with assist levels (OT) Description: LTG: Patient will bathe all body parts with assist levels (OT) Outcome: Completed/Met   Problem: RH Dressing Goal: LTG Patient will perform upper body dressing (OT) Description: LTG Patient will perform upper body dressing with assist, with/without cues (OT). Outcome: Completed/Met Goal: LTG Patient will perform lower body dressing w/assist (OT) Description: LTG: Patient will perform lower body dressing with assist, with/without cues in positioning using equipment (OT) Outcome: Completed/Met   Problem: RH Toileting Goal: LTG Patient will perform toileting task (3/3 steps) with assistance level (OT) Description: LTG: Patient will perform toileting task (3/3 steps) with assistance level (OT)  Outcome: Completed/Met   Problem: RH Functional Use of Upper Extremity Goal: LTG Patient will use RT/LT upper extremity as a (OT) Description: LTG: Patient will use right/left upper  extremity as a stabilizer/gross assist/diminished/nondominant/dominant level with assist, with/without cues during functional activity (OT) Outcome: Completed/Met

## 2022-10-04 NOTE — Consult Note (Addendum)
WOC Nurse ostomy follow up Stoma type/location: RLQ ileostomy Stomal assessment/size: 1 1/4" budded pink and moist  producing liquid brown stool today. He states output has been high over the weekend with several leaks.  Requesting to wear high output pouch with spout.  Informed that solid particulates of stool may not drain from this pouch. He understands how to switch to roll closure pouch (2 piece system) if needed.  Peristomal assessment: skin intact midline incision Treatment options for stomal/peristomal skin: stoma powder and skin prep Barrier ring 2 piece 2 1/4" pouch, roll closure and high output  Output liquid yellow   Ostomy pouching: 2pc.  Education provided: Mom performs pouch change independently today. We measure stoma (1 1/4") and she cuts barrier to fit.  I provide measuring guide with her size marked. She applies powder and skin prep as well as stretching barrier ring.  She assembles barrier and pouch and applies without difficulty. Patient and mother are anxious to go home.  We discuss the ostomy clinic and I provide them with the phone number as an outpatient resource.  [phone])   I will order supplies for home this afternoon. The following were requested to be ordered (secure chat to RN) 2 1/4" barrier(7) LAWSON # [id number] 2 1/4" pouch  (3) LAWSON # [id number]  2 1/4" high output pouch LAWSON # [id number] Stoma powder (LAWSON # 6) SKin prep (LAWSON # [id number]) Barrier rings (LAWSON # [phone]) Enrolled patient in [place] Secure Start Discharge program: Yes previously Will not follow at this time.  Please re-consult if needed.  Mike Gip MSN, RN, FNP-BC CWON Wound, Ostomy, Continence Nurse Outpatient [facility] [phone] Pager [phone]

## 2022-10-04 NOTE — Progress Notes (Signed)
Physical Therapy Discharge Summary  Patient Details  Name: Shane Klein MRN: [id number] Date of Birth: [birth year]  Date of Discharge from PT service:October 04, 2022  Today's Date: 10/04/2022 PT Individual Time: 0900-1010  PT Individual Time Calculation (min): 70 min    Patient has met 9 of 9 long term goals due to improved activity tolerance, improved balance, improved postural control, increased strength, and functional use of  right lower extremity and left lower extremity.  Patient to discharge at an ambulation level for short, home mobility and WC level for community mobility with need of Supervision.   Patient's care partner is independent to provide the necessary physical assistance at discharge.  Reasons goals not met: N/A  Recommendation:  Patient will benefit from ongoing skilled PT services in outpatient setting to continue to advance safe functional mobility, address ongoing impairments in muscle strength, motor coordination, balance, locomotion, and minimize fall risk.  Equipment: WC, Right Platform Walker, Bilateral AFOs  Reasons for discharge: treatment goals met and discharge from hospital  Patient/family agrees with progress made and goals achieved: Yes  PT Discharge Precautions/Restrictions Precautions Precautions: Fall;Other (comment) Precaution Comments: R colostomy/ileostomy Required Braces or Orthoses: Other Brace Other Brace: BLE AFOs Restrictions Weight Bearing Restrictions: Yes RUE Weight Bearing: Weight bearing as tolerated RLE Weight Bearing: Weight bearing as tolerated LLE Weight Bearing: Weight bearing as tolerated Other Position/Activity Restrictions: RUE WBAT thru elbow only, no lifting > 5 lbs; no ROM restrictions Vital Signs  Pain Pain Assessment Pain Scale: 0-10 Pain Score: 7  Pain Type: Acute pain Pain Location: Leg Pain Orientation: Right Pain Descriptors / Indicators: Aching Pain Onset: With Activity Patients Stated Pain Goal:  0 Pain Intervention(s): Repositioned Multiple Pain Sites: No Pain Interference Pain Interference Pain Effect on Sleep: 1. Rarely or not at all Pain Interference with Therapy Activities: 1. Rarely or not at all Pain Interference with Day-to-Day Activities: 1. Rarely or not at all Vision/Perception  Vision - History Ability to See in Adequate Light: 0 Adequate  Cognition Overall Cognitive Status: Within Functional Limits for tasks assessed Arousal/Alertness: Awake/alert Orientation Level: Oriented X4 Sensation Sensation Light Touch: Impaired by gross assessment Light Touch Impaired Details: Impaired RLE;Impaired LLE Motor  Motor Motor: Paraplegia;Abnormal postural alignment and control Motor - Skilled Clinical Observations: paraplegia L LE weaker than R LE; impaired trunk control; and R UE weakness due to injury  Mobility Bed Mobility Bed Mobility: Rolling Right;Rolling Left;Supine to Sit;Sit to Supine Rolling Right: Independent Rolling Left: Independent Supine to Sit: Independent Sit to Supine: Independent Transfers Transfers: Stand Pivot Transfers Stand Pivot Transfers: Contact Guard/Touching assist Transfer (Assistive device): Right platform walker Locomotion  Gait Ambulation: Yes Gait Assistance: Contact Guard/Touching assist Gait Distance (Feet): 90 Feet Assistive device: Right platform walker Stairs / Additional Locomotion Stairs: No Wheelchair Mobility Wheelchair Mobility: Yes Wheelchair Assistance: Supervision/Verbal cueing Wheelchair Parts Management: Supervision/cueing  Trunk/Postural Assessment  Cervical Assessment Cervical Assessment: Within Functional Limits Thoracic Assessment Thoracic Assessment: Within Functional Limits Lumbar Assessment Lumbar Assessment: Within Functional Limits Postural Control Postural Control: Deficits on evaluation Righting Reactions: delayed and insufficient  Balance Balance Balance Assessed: Yes Static Sitting  Balance Static Sitting - Balance Support: Feet supported Static Sitting - Level of Assistance: 6: Modified independent (Device/Increase time) Dynamic Sitting Balance Dynamic Sitting - Balance Support: During functional activity Dynamic Sitting - Level of Assistance: 5: Stand by assistance Dynamic Sitting - Balance Activities: Lateral lean/weight shifting;Forward lean/weight shifting Static Standing Balance Static Standing - Balance Support: Bilateral upper extremity supported Static Standing -  Level of Assistance: 5: Stand by assistance Dynamic Standing Balance Dynamic Standing - Balance Support: During functional activity Dynamic Standing - Level of Assistance: 5: Stand by assistance Extremity Assessment      RLE Assessment RLE Assessment: Exceptions to [facility] RLE Strength Right Hip Flexion: 3+/5 Right Hip Extension: 3+/5 Right Hip ABduction: 3+/5 Right Hip ADduction: 3+/5 Right Knee Flexion: 3+/5 Right Knee Extension: 3+/5 Right Ankle Dorsiflexion: 3+/5 Right Ankle Plantar Flexion: 3+/5   LLE Assessment LLE Assessment: Exceptions to [facility] LLE Strength Left Hip Flexion: 1/5 Left Hip Extension: 1/5 Left Hip ABduction: 3-/5 Left Hip ADduction: 2+/5 Left Knee Flexion: 3/5 Left Knee Extension: 3/5 Left Ankle Dorsiflexion: 0/5 Left Ankle Plantar Flexion: 0/5  Session Note: Chart reviewed and pt agreeable to therapy. Pt received semi-reclined in bed with c/o pain that was not quantified but pt described as "sciatic". Also of note, mother in room and present t/o session for d/c planning and family education. Session focused on functional review for d/c and family education to promote safe home mobility. Pt initiated session with review of functional mobility as detailed above for discharge from PT services. Of note, pt fatigue very high t/o session, with pt difficult to rouse even in unsupported sitting for 15 mins during evaluation. This PT consulted primary care team and therapist  from most recent PT session to clarify needed information. Session also included extensive education on continuation of care, safety with home mobility, WC management, and family training on curb-bumping WC for home entry of 1 small (<5") step in event ramp not yet installed. At end of session, pt was left semi-reclined in bed with alarm engaged, nurse call bell and all needs in reach.     Dionne Milo 10/04/2022, 1:45 PM

## 2022-10-04 NOTE — Progress Notes (Signed)
Inpatient Rehabilitation Care Coordinator Discharge Note   Patient Details  Name: Shane Klein MRN: [id number] Date of Birth: 20-Feb-1991   Discharge location: HOME WITH MOM AND STEP-DAD AWARE WILL NEED 24/7 CARE  Length of Stay: 32 DAYS  Discharge activity level: SUPERIVISION-MIN ASSIST LEVEL  Home/community participation: ACTIVE  Patient response WG:NFAOZH Literacy - How often do you need to have someone help you when you read instructions, pamphlets, or other written material from your doctor or pharmacy?: Never  Patient response YQ:MVHQIO Isolation - How often do you feel lonely or isolated from those around you?: Rarely  Services provided included: MD, RD, PT, OT, RN, CM, TR, Pharmacy, Neuropsych, SW  Financial Services:  Field seismologist Utilized: Private Insurance [employer]  Choices offered to/list presented to: PT AND MOM  Follow-up services arranged:  Outpatient, DME, Patient/Family has no preference for HH/DME agencies    Outpatient Servicies: [facility] WILL CALL TO SET UP FOLLOW UP APPOINTMENTS DME : [facility] B[facility], R-PLATFROM ROLLING WALKER, TUB BENCH    STALLS-WHEELCHAIR [facility] REFERRAL MADE WITH [employer]ICA[employer] AND TRANSPORTATION NUMBER GIVEN TO BOTH [phone] OR [phone]    Patient response to transportation need: Is the patient able to respond to transportation needs?: Yes In the past 12 months, has lack of transportation kept you from medical appointments or from getting medications?: No In the past 12 months, has lack of transportation kept you from meetings, work, or from getting things needed for daily living?: No   Patient/Family verbalized understanding of follow-up arrangements:  Yes  Individual responsible for coordination of the follow-up plan: SELF [phone]  Confirmed correct DME delivered: Lucy Chris 10/04/2022    Comments (or additional information): MOM WAS HERE ALONG WITH  SISTER TO LEAR HIS OSTOMY CARE, AWARE CAN NOT GET HOME HEALTH DUE TO SAFETY REASONS. ALL INFORMED HOW TO GET SUPPLIES FOR HIS OSTOMY. AWARE OF NEED FOR 24/7 CARE AT DC  Summary of Stay    Date/Time Discharge Planning CSW  09/28/22 [phone] Mom has been here for hands on education with ostomy and been to therapies with son. OP referral sent to [address] Will order DME so in place for discharge. Pt doing well and progressing in his therapies. RGD  09/21/22 0853 Doing well in his therapies Mom has not been here as much but will need to begin education along with pt for wounds and ileostomy care. No home health agency will accept due to safety issue. WC eval completed will order other equipment needed. RGD  09/14/22 1050 Progressing in his therapies, Mom is here daily. Applying for SSD/SSI. Confusion over vistiors and number he can have along with who it can be. Consulting civil engineer dealing with this. Have been unable to obtain [facility] agency to see him due to safety issue. RGD  09/07/22 0851 HOme with mom and step dad who between both and pt's sister and niece they can provide 24/7 care. Mom and sister here daily ttold prior to admission can have up to five people visiting him with confidential status. Pt on list for neuro-psych was seeing psychiatry while on acute may want to continue this. Family very supportive. Referral made to [facility] for assist with SSD/SSI application. RGD       Lucy Chris

## 2022-10-04 NOTE — Progress Notes (Signed)
Patient emptying his own ileostomy bag this shift. Supplies given from case manager Clydie Braun to this nurse to give to patient. Informed patient and his mother about the supplies and how to perform wound care. Patient stated understanding.

## 2022-10-05 DIAGNOSIS — R109 Unspecified abdominal pain: Secondary | ICD-10-CM

## 2022-10-05 DIAGNOSIS — E440 Moderate protein-calorie malnutrition: Secondary | ICD-10-CM

## 2022-10-05 DIAGNOSIS — D75839 Thrombocytosis, unspecified: Secondary | ICD-10-CM

## 2022-10-05 LAB — MAGNESIUM: Magnesium: 1.2 mg/dL — ABNORMAL LOW (ref 1.7–2.4)
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
## 2022-10-06 ENCOUNTER — Telehealth: Payer: Self-pay | Admitting: *Deleted

## 2022-10-06 NOTE — Progress Notes (Signed)
Recreational Therapy Discharge Summary Patient Details  Name: DEIGO ALONSO MRN: [id number] Date of Birth: 03/01/1991 Today's Date: 10/06/2022 Comments on progress toward goals: Pt has made excellent progress toward goals and discharged home with family to provide/coordinate supervision/min assist.  TR sessions focused on leisure education, activity analysis/modifications, coping, self advocacy/directing his care, discharge planning & community resources.  Pt is motivated to regain further independence and return to previously enjoyed activities.  Reasons for discharge: discharge from hospital  Follow-up: Outpatient  Patient/family agrees with progress made and goals achieved: Yes  Phoenyx Melka 10/06/2022, 9:13 AM

## 2022-10-06 NOTE — Telephone Encounter (Signed)
PA initiated via covermymeds  Oxycontin 20 MG Key: [id number]

## 2022-10-07 ENCOUNTER — Emergency Department (HOSPITAL_COMMUNITY)
Admission: EM | Admit: 2022-10-07 | Discharge: 2022-10-08 | Disposition: A | Discharge status: Home / Self Care | Payer: Medicaid Other | Attending: Emergency Medicine | Admitting: Emergency Medicine

## 2022-10-07 DIAGNOSIS — Z7982 Long term (current) use of aspirin: Secondary | ICD-10-CM | POA: Insufficient documentation

## 2022-10-07 DIAGNOSIS — Z932 Ileostomy status: Secondary | ICD-10-CM | POA: Insufficient documentation

## 2022-10-07 DIAGNOSIS — Z432 Encounter for attention to ileostomy: Secondary | ICD-10-CM

## 2022-10-07 DIAGNOSIS — Z5900 Homelessness unspecified: Secondary | ICD-10-CM | POA: Insufficient documentation

## 2022-10-07 DIAGNOSIS — F17210 Nicotine dependence, cigarettes, uncomplicated: Secondary | ICD-10-CM | POA: Insufficient documentation

## 2022-10-07 DIAGNOSIS — Z7901 Long term (current) use of anticoagulants: Secondary | ICD-10-CM | POA: Insufficient documentation

## 2022-10-08 ENCOUNTER — Encounter (HOSPITAL_COMMUNITY): Payer: Self-pay | Admitting: Emergency Medicine

## 2022-10-08 ENCOUNTER — Encounter (HOSPITAL_COMMUNITY): Payer: Self-pay

## 2022-10-08 ENCOUNTER — Emergency Department (HOSPITAL_COMMUNITY)
Admission: EM | Admit: 2022-10-08 | Discharge: 2022-10-08 | Disposition: A | Discharge status: Home / Self Care | Payer: Medicaid Other | Source: Home / Self Care | Attending: Emergency Medicine | Admitting: Emergency Medicine

## 2022-10-08 ENCOUNTER — Other Ambulatory Visit: Payer: Self-pay

## 2022-10-08 DIAGNOSIS — Z7982 Long term (current) use of aspirin: Secondary | ICD-10-CM | POA: Insufficient documentation

## 2022-10-08 DIAGNOSIS — Z7901 Long term (current) use of anticoagulants: Secondary | ICD-10-CM | POA: Insufficient documentation

## 2022-10-08 DIAGNOSIS — F17210 Nicotine dependence, cigarettes, uncomplicated: Secondary | ICD-10-CM | POA: Insufficient documentation

## 2022-10-08 DIAGNOSIS — Z5900 Homelessness unspecified: Secondary | ICD-10-CM | POA: Insufficient documentation

## 2022-10-08 MED ORDER — ASPIRIN 81 MG PO TBEC: 81.0000 mg | DELAYED_RELEASE_TABLET | Freq: Every day | ORAL | Status: DC

## 2022-10-08 MED ORDER — APIXABAN 5 MG PO TABS: 5.0000 mg | ORAL_TABLET | Freq: Two times a day (BID) | ORAL | Status: DC

## 2022-10-08 MED ORDER — TEMAZEPAM 15 MG PO CAPS: 15.0000 mg | ORAL_CAPSULE | Freq: Every day | ORAL | Status: DC

## 2022-10-08 MED ORDER — DOXAZOSIN MESYLATE 2 MG PO TABS
2.0000 mg | ORAL_TABLET | Freq: Two times a day (BID) | ORAL | Status: DC
  Filled 2022-10-08: qty 1

## 2022-10-08 MED ORDER — DICLOFENAC SODIUM 1 % EX GEL
2.0000 g | Freq: Four times a day (QID) | CUTANEOUS | Status: DC
  Filled 2022-10-08: qty 100

## 2022-10-08 MED ORDER — MELATONIN 3 MG PO TABS: 3.0000 mg | ORAL_TABLET | Freq: Every day | ORAL | Status: DC

## 2022-10-08 MED ORDER — LOPERAMIDE HCL 2 MG PO CAPS: 4.0000 mg | ORAL_CAPSULE | ORAL | Status: DC | PRN

## 2022-10-08 MED ORDER — OXYCODONE HCL 5 MG PO TABS: 10.0000 mg | ORAL_TABLET | ORAL | Status: DC | PRN

## 2022-10-08 MED ORDER — VITAMIN B-12 1000 MCG PO TABS: 1000.0000 ug | ORAL_TABLET | Freq: Every day | ORAL | Status: DC

## 2022-10-08 MED ORDER — METHOCARBAMOL 500 MG PO TABS
1000.0000 mg | ORAL_TABLET | Freq: Four times a day (QID) | ORAL | Status: DC
  Administered 2022-10-08: 1000 mg via ORAL
  Filled 2022-10-08: qty 2

## 2022-10-08 MED ORDER — CYPROHEPTADINE HCL 4 MG PO TABS: 4.0000 mg | ORAL_TABLET | Freq: Every day | ORAL | Status: DC

## 2022-10-08 MED ORDER — MAGNESIUM OXIDE -MG SUPPLEMENT 400 (240 MG) MG PO TABS: 800.0000 mg | ORAL_TABLET | Freq: Two times a day (BID) | ORAL | Status: DC

## 2022-10-08 MED ORDER — OXYCODONE HCL ER 15 MG PO T12A: 60.0000 mg | EXTENDED_RELEASE_TABLET | Freq: Two times a day (BID) | ORAL | Status: DC

## 2022-10-08 MED ORDER — FERROUS SULFATE 325 (65 FE) MG PO TABS: 325.0000 mg | ORAL_TABLET | Freq: Two times a day (BID) | ORAL | Status: DC

## 2022-10-08 MED ORDER — BUSPIRONE HCL 10 MG PO TABS: 15.0000 mg | ORAL_TABLET | Freq: Three times a day (TID) | ORAL | Status: DC

## 2022-10-08 MED ORDER — CALCIUM POLYCARBOPHIL 625 MG PO TABS
1250.0000 mg | ORAL_TABLET | Freq: Three times a day (TID) | ORAL | Status: DC
  Filled 2022-10-08: qty 2

## 2022-10-08 MED ORDER — CHOLESTYRAMINE 4 G PO PACK
4.0000 g | PACK | Freq: Every day | ORAL | Status: DC
  Filled 2022-10-08: qty 1

## 2022-10-08 MED ORDER — VITAMIN D 25 MCG (1000 UNIT) PO TABS: 2000.0000 [IU] | ORAL_TABLET | Freq: Two times a day (BID) | ORAL | Status: DC

## 2022-10-08 MED ORDER — DULOXETINE HCL 30 MG PO CPEP: 120.0000 mg | ORAL_CAPSULE | Freq: Every day | ORAL | Status: DC

## 2022-10-08 MED ORDER — ACETAMINOPHEN 325 MG PO TABS: 325.0000 mg | ORAL_TABLET | Freq: Four times a day (QID) | ORAL | Status: DC | PRN

## 2022-10-08 MED ORDER — GABAPENTIN 300 MG PO CAPS: 1200.0000 mg | ORAL_CAPSULE | Freq: Three times a day (TID) | ORAL | Status: DC

## 2022-10-08 MED ORDER — VITAMIN C 500 MG PO TABS: 1000.0000 mg | ORAL_TABLET | Freq: Every day | ORAL | Status: DC

## 2022-10-08 MED ORDER — FAMOTIDINE 20 MG PO TABS: 20.0000 mg | ORAL_TABLET | Freq: Two times a day (BID) | ORAL | Status: DC

## 2022-10-08 MED ORDER — BACLOFEN 10 MG PO TABS: 5.0000 mg | ORAL_TABLET | Freq: Three times a day (TID) | ORAL | Status: DC

## 2022-10-08 MED ORDER — FLUDROCORTISONE ACETATE 0.1 MG PO TABS
0.1000 mg | ORAL_TABLET | Freq: Every day | ORAL | Status: DC
  Filled 2022-10-08: qty 1

## 2022-10-08 NOTE — ED Notes (Signed)
[facility]tar called 
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
## 2022-10-08 NOTE — Consult Note (Signed)
WOC paged by ED staff, reported that pouch has been changed 2x and continues to leak. Patient is unable to care for himself.  WOC nursing team has been working with girlfriend and primarily mother in CIR to teach them to care for patient's ostomy at home.  Unclear if patient was sent home with supplies from CIR and if so where the supplies may be in the current situation with housing.   I have asked the ED staff to order (10) 2 1/4" skin barriers and a box of 2" barrier rings.  I have secured 10 high ouput 2 1/4" pouches from the WOC samples to provide to patient.   [facility] will cover ostomy supplies through  [facility]   [phone] Customer Service Representatives will be happy to assist you with placing your order Monday - Friday: 8:30 AM - 7:00 PM (local time), and Saturday: 9:00 AM - 1:00 PM (local time). Patient and his family will need to be responsible for order his supplies through this vendor.  Patient has also been enrolled in [facility] program and information has been provided to patient/family on how to contact them as well for assistance with ostomy supplies   WOC is awaiting call back from ED staff when supplies arrive for me to visit patient and change pouch prior to DC   [facility], CNS, CWON-AP [phone]

## 2022-10-08 NOTE — ED Provider Notes (Signed)
Patient seen by SW who spoke to mother, patient will be discharged back to mother's house.  May need PTAR to get back.   Terald Sleeper, MD 10/08/22 [phone]
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
## 2022-10-08 NOTE — Discharge Instructions (Signed)
Please follow-up with your primary care doctor, your ileostomy, needs to be changed as directed.  I have contacted social work and they will call you at home, discussed supplies.
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
## 2022-10-08 NOTE — ED Triage Notes (Signed)
Pt arrived from home via [facility] c/o he ran out of colostomy supplies and it began leaking.

## 2022-10-08 NOTE — Progress Notes (Signed)
CSW spoke with patients mother Shane Klein, [phone] who stated patient can come to her address at [address] in [place]. CSW then received information that patients girlfriend Shane Klein was present at bedside to take patient home. CSW spoke with patient and [name] at bedside. [name] stated she has been with patient for 10 years and can provide care for him. [name] stated her number is [phone] and she lives at [address] Patient stated he needs more Colostomy supplies.  Patient was given the wound care department number to assist him with supplies moving forward at home.    CSW contacted patients mother back to let her know her son decided to go with his girlfriend.

## 2022-10-10 ENCOUNTER — Emergency Department (HOSPITAL_COMMUNITY)
Admission: EM | Admit: 2022-10-10 | Discharge: 2022-10-10 | Disposition: A | Discharge status: Home / Self Care | Payer: Medicaid Other | Attending: Emergency Medicine | Admitting: Emergency Medicine

## 2022-10-10 ENCOUNTER — Emergency Department (HOSPITAL_COMMUNITY): Payer: Medicaid Other

## 2022-10-10 ENCOUNTER — Encounter (HOSPITAL_COMMUNITY): Payer: Self-pay

## 2022-10-10 DIAGNOSIS — Z7901 Long term (current) use of anticoagulants: Secondary | ICD-10-CM | POA: Diagnosis not present

## 2022-10-10 DIAGNOSIS — R400 Somnolence: Secondary | ICD-10-CM | POA: Diagnosis not present

## 2022-10-10 DIAGNOSIS — K219 Gastro-esophageal reflux disease without esophagitis: Secondary | ICD-10-CM | POA: Insufficient documentation

## 2022-10-10 DIAGNOSIS — Z9889 Other specified postprocedural states: Secondary | ICD-10-CM | POA: Insufficient documentation

## 2022-10-10 DIAGNOSIS — Z7982 Long term (current) use of aspirin: Secondary | ICD-10-CM | POA: Diagnosis not present

## 2022-10-10 DIAGNOSIS — F17210 Nicotine dependence, cigarettes, uncomplicated: Secondary | ICD-10-CM | POA: Diagnosis not present

## 2022-10-10 DIAGNOSIS — Z7189 Other specified counseling: Secondary | ICD-10-CM

## 2022-10-10 DIAGNOSIS — R7401 Elevation of levels of liver transaminase levels: Secondary | ICD-10-CM | POA: Diagnosis not present

## 2022-10-10 DIAGNOSIS — Z939 Artificial opening status, unspecified: Secondary | ICD-10-CM

## 2022-10-10 DIAGNOSIS — R1084 Generalized abdominal pain: Secondary | ICD-10-CM | POA: Diagnosis present

## 2022-10-10 LAB — CBC WITH DIFFERENTIAL/PLATELET
Abs Immature Granulocytes: 0.02 10*3/uL (ref 0.00–0.07)
Basophils Absolute: 0.1 10*3/uL (ref 0.0–0.1)
Basophils Relative: 1 %
Eosinophils Absolute: 0.1 10*3/uL (ref 0.0–0.5)
Eosinophils Relative: 2 %
HCT: 31.8 % — ABNORMAL LOW (ref 39.0–52.0)
Hemoglobin: 10.2 g/dL — ABNORMAL LOW (ref 13.0–17.0)
Immature Granulocytes: 0 %
Lymphocytes Relative: 18 %
Lymphs Abs: 1.4 10*3/uL (ref 0.7–4.0)
MCH: 26.6 pg (ref 26.0–34.0)
MCHC: 32.1 g/dL (ref 30.0–36.0)
MCV: 83 fL (ref 80.0–100.0)
Monocytes Absolute: 0.8 10*3/uL (ref 0.1–1.0)
Monocytes Relative: 10 %
Neutro Abs: 5.7 10*3/uL (ref 1.7–7.7)
Neutrophils Relative %: 69 %
Platelets: 627 10*3/uL — ABNORMAL HIGH (ref 150–400)
RBC: 3.83 MIL/uL — ABNORMAL LOW (ref 4.22–5.81)
RDW: 17.2 % — ABNORMAL HIGH (ref 11.5–15.5)
WBC: 8.1 10*3/uL (ref 4.0–10.5)
nRBC: 0 % (ref 0.0–0.2)

## 2022-10-10 LAB — AMMONIA: Ammonia: 35 umol/L (ref 9–35)

## 2022-10-10 LAB — COMPREHENSIVE METABOLIC PANEL
ALT: 115 U/L — ABNORMAL HIGH (ref 0–44)
AST: 77 U/L — ABNORMAL HIGH (ref 15–41)
Albumin: 3 g/dL — ABNORMAL LOW (ref 3.5–5.0)
Alkaline Phosphatase: 133 U/L — ABNORMAL HIGH (ref 38–126)
Anion gap: 9 (ref 5–15)
BUN: 26 mg/dL — ABNORMAL HIGH (ref 6–20)
CO2: 25 mmol/L (ref 22–32)
Calcium: 9.5 mg/dL (ref 8.9–10.3)
Chloride: 99 mmol/L (ref 98–111)
Creatinine, Ser: 0.75 mg/dL (ref 0.61–1.24)
GFR, Estimated: >60 mL/min (ref >60)
Glucose, Bld: 102 mg/dL — ABNORMAL HIGH (ref 70–99)
Potassium: 4.6 mmol/L (ref 3.5–5.1)
Sodium: 133 mmol/L — ABNORMAL LOW (ref 135–145)
Total Bilirubin: 0.9 mg/dL (ref 0.3–1.2)
Total Protein: 8.4 g/dL — ABNORMAL HIGH (ref 6.5–8.1)

## 2022-10-10 LAB — I-STAT VENOUS BLOOD GAS, ED
Acid-Base Excess: 7 mmol/L — ABNORMAL HIGH (ref 0.0–2.0)
Bicarbonate: 31.7 mmol/L — ABNORMAL HIGH (ref 20.0–28.0)
Calcium, Ion: 1.22 mmol/L (ref 1.15–1.40)
HCT: 33 % — ABNORMAL LOW (ref 39.0–52.0)
Hemoglobin: 11.2 g/dL — ABNORMAL LOW (ref 13.0–17.0)
O2 Saturation: 98 %
Potassium: 5.5 mmol/L — ABNORMAL HIGH (ref 3.5–5.1)
Sodium: 140 mmol/L (ref 135–145)
TCO2: 33 mmol/L — ABNORMAL HIGH (ref 22–32)
pCO2, Ven: 43.9 mmHg — ABNORMAL LOW (ref 44–60)
pH, Ven: 7.466 — ABNORMAL HIGH (ref 7.25–7.43)
pO2, Ven: 101 mmHg — ABNORMAL HIGH (ref 32–45)

## 2022-10-10 LAB — LIPASE, BLOOD: Lipase: 118 U/L — ABNORMAL HIGH (ref 11–51)

## 2022-10-10 LAB — ETHANOL: Alcohol, Ethyl (B): <10 mg/dL (ref <10)

## 2022-10-10 LAB — SALICYLATE LEVEL: Salicylate Lvl: <7 mg/dL — ABNORMAL LOW (ref 7.0–30.0)

## 2022-10-10 LAB — ACETAMINOPHEN LEVEL: Acetaminophen (Tylenol), Serum: <10 ug/mL — ABNORMAL LOW (ref 10–30)

## 2022-10-10 MED ORDER — IOHEXOL 350 MG/ML SOLN
75.0000 mL | Freq: Once | INTRAVENOUS | Status: AC | PRN
  Administered 2022-10-10: 75 mL via INTRAVENOUS

## 2022-10-10 NOTE — ED Provider Notes (Signed)
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 07/28/2022   ANEMIA-IRON DEFICIENCY 04/02/2009   Crohn's disease (HCC) 2010   by colon:cecum and ascending colon, by EGD: duodenum, by imaging also in ileum. No villous atrophy on duodenal biopsies.    GERD (gastroesophageal reflux disease)    GI bleed 04/22/2013   EGD by Dr Dorena Cookey unremarkable   Iron deficiency anemia    Protein-calorie malnutrition, severe (HCC) 04/15/2013   Right supracondylar humerus fracture, open, initial encounter 07/28/2022   SBO (small bowel obstruction) (HCC) 2013   in [place] in area of active Crohn's.    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    Final Clinical Impression(s) / ED Diagnoses Final diagnoses:  Generalized abdominal pain  History of creation of ostomy [facility])  Encounter for ostomy care education     This chart was dictated using voice recognition software.  Despite best efforts to proofread,  errors can occur which can change the documentation meaning.    Sloan Leiter, DO 10/10/22 [phone]

## 2022-10-10 NOTE — ED Notes (Signed)
This RN went to round on the pt. Pt had removed all monitoring cords, removed ostomy pouch and threw everything in the floor. Pt states "that's where it belongs aint it". Pt educated on the importance of leaving the monitoring O2 and EKG device on and replies and continued to kick linen in the floor.

## 2022-10-10 NOTE — ED Notes (Signed)
Wound care and ostomy care performed.

## 2022-10-10 NOTE — ED Notes (Signed)
Pts colostomy bag came off, pt states it wasn't sticking on correctly. Nurse notified at this time

## 2022-10-10 NOTE — ED Triage Notes (Signed)
Pt from mother's house BIB [facility] for c/o hematuria, abd pain and needing supplies for colostomy, EMS report mental status change in route, reports pt became somnolent, appears sleepy, pt admitted to sedation medications, unsure when last took them or what they were. Pt alert on arrival, VSS, GCS 14. Pt appear drowsy.  EMS reports EKG showed possible STEMI, called in but it was ruled out by MD PTA.

## 2022-10-10 NOTE — ED Notes (Signed)
This RN was rounding on the pt. Pt found removing the ostomy dressing from his stomach and removing the EKG  monitor. Pt stated "it burns so it has to come off."

## 2022-10-10 NOTE — Discharge Instructions (Signed)
It was a pleasure caring for you today in the emergency department. ° °Please return to the emergency department for any worsening or worrisome symptoms. ° ° °

## 2022-10-11 ENCOUNTER — Other Ambulatory Visit: Payer: Self-pay

## 2022-10-11 ENCOUNTER — Emergency Department (HOSPITAL_COMMUNITY)
Admission: EM | Admit: 2022-10-11 | Discharge: 2022-10-11 | Disposition: A | Discharge status: Home / Self Care | Payer: Medicaid Other | Attending: Emergency Medicine | Admitting: Emergency Medicine

## 2022-10-11 ENCOUNTER — Encounter (HOSPITAL_COMMUNITY): Payer: Self-pay | Admitting: *Deleted

## 2022-10-11 DIAGNOSIS — Z4800 Encounter for change or removal of nonsurgical wound dressing: Secondary | ICD-10-CM | POA: Diagnosis present

## 2022-10-11 DIAGNOSIS — Z5189 Encounter for other specified aftercare: Secondary | ICD-10-CM

## 2022-10-11 NOTE — ED Triage Notes (Addendum)
Patient presents to ed c/o skin irration around his ostomy is  getting worse, states he is running out of his bags and his mother feels like he is getting weaker. He and his mother want to talk to the wound care nurse.

## 2022-10-11 NOTE — Consult Note (Signed)
WOC Nurse ostomy consult note Stoma type/location: RUQ ileostomy.  Has been home less than a week and this is the third visit back to ED due to ostomy pouch. Initially after discharge, mother was not participating in patient's care and she was the caregiver assigned and taught ostomy care.  Patient can empty pouch but due to dexterity issues, could not cut or apply pouch.  Mother is present today and states she will be helping him.  THere is full thickness tissue  loss from 6 o'clock to 12 o'clock that extends 6.5 cm.  We discuss skin stripping and appropriate way to remove pouch gently.   Stomal assessment/size: 7/8" flush Peristomal assessment:   See above, contact irritant dermatitis from leaking ileostomy and small bowel effluent Treatment options for stomal/peristomal skin: stoma powder and skin prep x 2 layers to peristomal skin.  Barrier ring around stoma and second barrier ring from 6 to 12 oclock to improve seal and aid in healing.  Mother is shown how to do this.  Output liquid yellow sttol Ostomy pouching: 2pc. Convex pouch with high output spout. Bedside RN will get them a foley bag and disposable paper scrubs as his clothes are heavily soiled with effluent.  I will send a prescription to [facility] and have made appointment in ostomy clinic for Friday at 10 o'clock.  Mother and patient agree to be present. Will order 5 additional pouch sets [employer] # [id number] 2 1/4" convex barrier [employer]SON # [id number]  High output pouch Stoma powder # 6 quantity 1 Skin prep [employer]SON # [id number] 1 box Barrier rings quantity 10 [employer]SON # [id number]  Education provided: see above.  Enrolled patient in [employer] DC program: Yes during admission Will not follow at this time.  Please re-consult if needed.  Mike Gip MSN, RN, FNP-BC CWON Wound, Ostomy, Continence Nurse Outpatient [facility] [phone] Pager [phone]

## 2022-10-11 NOTE — ED Notes (Signed)
Pt reported to RN that his ostomy was leaking. Pt given towel to assist with leakage, awaiting supplies to address problem.

## 2022-10-11 NOTE — ED Notes (Signed)
Pt left ER with family before receiving ostomy barriers. Pt seen being wheeled out ER by family per additional staff.

## 2022-10-11 NOTE — ED Notes (Signed)
Pt cleaned and drainage bag attached to ostomy. Pt given clean set of paper scrubs, and linen changed.

## 2022-10-11 NOTE — ED Notes (Signed)
Pt called to be taken back to a room 3 times with no response. Pt. Moved to OTF

## 2022-10-11 NOTE — ED Notes (Signed)
Pt awaiting ostomy supplies from materials, they were out of pts usual ostomy bag so adjustments made for different ostomy pouches. Pt informed on update. AVS reviewed with pt and pt family.
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
## 2022-10-12 ENCOUNTER — Ambulatory Visit (HOSPITAL_COMMUNITY): Payer: Medicaid Other | Admitting: Nurse Practitioner

## 2022-10-15 ENCOUNTER — Ambulatory Visit (HOSPITAL_COMMUNITY): Payer: Medicaid Other | Admitting: Nurse Practitioner

## 2022-10-15 ENCOUNTER — Encounter (HOSPITAL_COMMUNITY): Payer: Self-pay | Admitting: Nurse Practitioner

## 2022-10-15 NOTE — Telephone Encounter (Signed)
PA OxyContin denied.

## 2022-10-20 ENCOUNTER — Ambulatory Visit: Payer: Medicaid Other | Attending: Physician Assistant | Admitting: Occupational Therapy

## 2022-10-20 ENCOUNTER — Encounter: Payer: Self-pay | Admitting: Occupational Therapy

## 2022-10-20 ENCOUNTER — Ambulatory Visit: Payer: Medicaid Other | Admitting: Occupational Therapy

## 2022-10-20 ENCOUNTER — Ambulatory Visit: Payer: Medicaid Other | Admitting: Physical Therapy

## 2022-10-20 DIAGNOSIS — R2681 Unsteadiness on feet: Secondary | ICD-10-CM

## 2022-10-20 DIAGNOSIS — G5631 Lesion of radial nerve, right upper limb: Secondary | ICD-10-CM

## 2022-10-20 DIAGNOSIS — M6281 Muscle weakness (generalized): Secondary | ICD-10-CM | POA: Insufficient documentation

## 2022-10-20 DIAGNOSIS — R278 Other lack of coordination: Secondary | ICD-10-CM

## 2022-10-20 DIAGNOSIS — W3400XA Accidental discharge from unspecified firearms or gun, initial encounter: Secondary | ICD-10-CM | POA: Diagnosis not present

## 2022-10-20 DIAGNOSIS — S24103A Unspecified injury at T7-T10 level of thoracic spinal cord, initial encounter: Secondary | ICD-10-CM

## 2022-10-20 DIAGNOSIS — R2689 Other abnormalities of gait and mobility: Secondary | ICD-10-CM

## 2022-10-20 DIAGNOSIS — Z741 Need for assistance with personal care: Secondary | ICD-10-CM

## 2022-10-20 DIAGNOSIS — S24109S Unspecified injury at unspecified level of thoracic spinal cord, sequela: Secondary | ICD-10-CM
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
## 2022-10-25 ENCOUNTER — Telehealth: Payer: Self-pay | Admitting: Primary Care

## 2022-10-25 ENCOUNTER — Telehealth: Payer: Self-pay

## 2022-10-25 NOTE — Telephone Encounter (Signed)
Patient called stating he needed a home health order for an aide. Order goes to [employer].

## 2022-10-25 NOTE — Telephone Encounter (Signed)
Copied from CRM [id number]. Topic: Referral - Request for Referral >> Oct 25, 2022  8:53 AM Everette C wrote: Has patient seen PCP for this complaint? Yes.   *If NO, is insurance requiring patient see PCP for this issue before PCP can refer them? Referral for which specialty: Home Health Care  Preferred provider/office: [facility]  Reason for referral: Patient has spinal injury concerns

## 2022-10-26 ENCOUNTER — Telehealth: Payer: Self-pay

## 2022-10-26 ENCOUNTER — Inpatient Hospital Stay (HOSPITAL_COMMUNITY): Admission: RE | Admit: 2022-10-26 | Payer: Medicaid Other | Source: Ambulatory Visit | Admitting: Nurse Practitioner

## 2022-10-26 DIAGNOSIS — K50112 Crohn's disease of large intestine with intestinal obstruction: Secondary | ICD-10-CM

## 2022-10-26 NOTE — Telephone Encounter (Signed)
Referral placed to [employer] Care Management

## 2022-10-26 NOTE — Telephone Encounter (Signed)
Call returned to patient and explained that he will need to see PCP in order for her to place a PCS referral.  The PCP would need to have seen him in the past 90 days.  His mother was in the background and asked if Dr Berline Chough could place the referral. He has an appointment with her on 11/01/2022.  I told her that they can ask Dr Berline Chough and if she is not able to make the referral, Ms Randa Evens, NP can place a PCS referral after he sees her on 11/08/2022.  They said they understood.  I told them to call me with any questions.  I also explained that I can refer him to Managed Medicaid Care Management to see if he is eligible for any additional services at home and he was in agreement with that referral

## 2022-10-27 ENCOUNTER — Ambulatory Visit: Payer: Medicaid Other | Admitting: Occupational Therapy

## 2022-10-28 ENCOUNTER — Emergency Department (HOSPITAL_COMMUNITY)
Admission: EM | Admit: 2022-10-28 | Discharge: 2022-10-28 | Disposition: A | Discharge status: Home / Self Care | Payer: Medicaid Other | Attending: Emergency Medicine | Admitting: Emergency Medicine

## 2022-10-28 ENCOUNTER — Other Ambulatory Visit: Payer: Self-pay

## 2022-10-28 ENCOUNTER — Encounter (HOSPITAL_COMMUNITY): Payer: Self-pay

## 2022-10-28 DIAGNOSIS — R21 Rash and other nonspecific skin eruption: Secondary | ICD-10-CM | POA: Insufficient documentation

## 2022-10-28 DIAGNOSIS — Z7901 Long term (current) use of anticoagulants: Secondary | ICD-10-CM | POA: Insufficient documentation

## 2022-10-28 DIAGNOSIS — Z7982 Long term (current) use of aspirin: Secondary | ICD-10-CM | POA: Diagnosis not present

## 2022-10-28 DIAGNOSIS — R109 Unspecified abdominal pain: Secondary | ICD-10-CM | POA: Diagnosis present

## 2022-10-28 DIAGNOSIS — Z79899 Other long term (current) drug therapy: Secondary | ICD-10-CM | POA: Diagnosis not present

## 2022-10-28 DIAGNOSIS — Z7189 Other specified counseling: Secondary | ICD-10-CM

## 2022-10-28 DIAGNOSIS — Z433 Encounter for attention to colostomy: Secondary | ICD-10-CM | POA: Diagnosis not present

## 2022-10-28 MED ORDER — LOPERAMIDE HCL 2 MG PO CAPS
4.0000 mg | ORAL_CAPSULE | Freq: Once | ORAL | Status: AC
  Administered 2022-10-28: 4 mg via ORAL
  Filled 2022-10-28: qty 2

## 2022-10-28 NOTE — ED Notes (Signed)
While this nurse was on her lunch break, pt removed second appliance set up from ostomy site and replaced them with a random wafer that was left at bedside from previous stoma measurement assessment. Pt informed that the department is now very very low on ostomy supplies due to him removing each appliance when he reports that they were "leaking." Stoma is still currently bubbling and passing large amount of now yellow liquid stool from stoma. Will reassess in 20 minutes for efficacy of the immodium as pt reports its been more than a day since his last dose. Pt reports his mother takes care of his ostomy at home, pt informed that discharge instructions and supply information to be called to mother at this time to ensure adequate supplies to be obtained from ostomy clinic until his mail order supplies arrive Monday as he currently reports they will.

## 2022-10-28 NOTE — Discharge Instructions (Signed)
Follow up with your surgeon

## 2022-10-28 NOTE — ED Notes (Addendum)
Skin around stoma blistered in areas. Skin cleaned gently with soap and water pat dry. No sting skin protectant applied to skin surrounding stoma. Wafer for stoma cut to 1 1/2", and ostomy bag secured at this time. Pt requesting extra ostomy supplies to go home with and will be supplied to pt on DC to home as his ostomy supplies aren't scheduled to arrive to him until Monday. Supplies to be sent with pt to attempt to prevent further breakdown of surrounding skin and alleviate need for more ER visits until he has his supplies acquired.

## 2022-10-28 NOTE — ED Notes (Signed)
Attempt made at this time to call mother regarding pt ostomy and the issue with lack of supplies until mail order arrives was unanswered. Instructions written on top of discharge papers by this nurse instructing to call outpatient ostomy clinic first thing 7/5 to see if they can help pt with obtaining enough supplies until his mail order arrives as the limited supply attempted to send home for pt, pt has used during his ER visit today. Will make additional attempt to notify mother prior to pt discharge home.

## 2022-10-28 NOTE — ED Notes (Addendum)
Pt holding entire ostomy appliance including wafer that had been secured to his skin and ostomy bag with orange liquid stool in it at this time. Nurse has been repeatedly called to bedside by patient numerous times during stay. Pt reports "oh you cut the hole too big and it was leaking." Pt informed that we only have set number of appliance set ups in department and prior to this nurse leaving his room wafer and ostomy bag were well adhered and well fitting to his abdomen. Pt stoma area now bleeding in several places around site, and skin is more irritated in appearance. Pt has had numerous education sessions regarding care of his ostomy, but reports to this nurse "I don't know the nurses over at [facility] just take care of it for me." Pt provided more education regarding skin integrity as pt not allowing appliance to adhere to skin properly.

## 2022-10-28 NOTE — ED Provider Notes (Signed)
[facility] EMERGENC[facility] DEPARTMENT AT [facility] Provider Note   CSN: [id number] Arrival date & time: 10/28/22  0031     History  Chief Complaint  Patient presents with   Abdominal Pain    Shane Klein is a 32 y.o. male.  32 yo M with a chief complaints of needing ostomy supplies.  He tells me that he ran out at home.  He feels like his skin is burning.  He also noticed a new rash to his right forearm.  Going on for couple days.  Denies trauma.  Denies contact with any creams or detergents or plants.   Abdominal Pain      Home Medications Prior to Admission medications   Medication Sig Start Date End Date Taking? Authorizing Provider  acetaminophen (TYLENOL) 325 MG tablet Take 1 tablet (325 mg total) by mouth every 6 (six) hours as needed for mild pain. 10/04/22   Angiulli, Mcarthur Rossetti, PA-C  apixaban (ELIQUIS) 5 MG TABS tablet Take 1 tablet (5 mg total) by mouth 2 (two) times daily. 10/04/22   Angiulli, Mcarthur Rossetti, PA-C  ascorbic acid (VITAMIN C) 1000 MG tablet Take 1 tablet (1,000 mg total) by mouth daily. 10/04/22   Angiulli, Mcarthur Rossetti, PA-C  aspirin EC 81 MG tablet Take 1 tablet (81 mg total) by mouth daily. Swallow whole. 10/04/22   Angiulli, Mcarthur Rossetti, PA-C  Baclofen 5 MG TABS Take 1 tablet (5 mg total) by mouth 3 (three) times daily. 10/04/22   Angiulli, Mcarthur Rossetti, PA-C  busPIRone (BUSPAR) 10 MG tablet Take 1.5 tablets (15 mg total) by mouth 3 (three) times daily. 10/04/22   Angiulli, Mcarthur Rossetti, PA-C  vitamin D3 (CHOLECALCIFEROL) 25 MCG tablet Take 2 tablets (2,000 Units total) by mouth 2 (two) times daily. 10/04/22   Angiulli, Mcarthur Rossetti, PA-C  cholestyramine (QUESTRAN) 4 g packet Take 1 packet (4 g total) by mouth daily after lunch. Patient not taking: Reported on 10/20/2022 10/04/22   Angiulli, Mcarthur Rossetti, PA-C  cyanocobalamin 1000 MCG tablet Take 1 tablet (1,000 mcg total) by mouth daily. 10/04/22   Angiulli, Mcarthur Rossetti, PA-C  cyproheptadine (PERIACTIN) 4 MG tablet Take 1 tablet  (4 mg total) by mouth at bedtime. 10/04/22   Angiulli, Mcarthur Rossetti, PA-C  diclofenac Sodium (VOLTAREN) 1 % GEL Apply 2 g topically 4 (four) times daily. 10/04/22   Angiulli, Mcarthur Rossetti, PA-C  doxazosin (CARDURA) 2 MG tablet Take 1 tablet (2 mg total) by mouth every 12 (twelve) hours. 10/04/22   Angiulli, Mcarthur Rossetti, PA-C  DULoxetine (CYMBALTA) 60 MG capsule Take 2 capsules (120 mg total) by mouth at bedtime. 10/04/22   Angiulli, Mcarthur Rossetti, PA-C  famotidine (PEPCID) 20 MG tablet Take 1 tablet (20 mg total) by mouth 2 (two) times daily. 10/04/22   Angiulli, Mcarthur Rossetti, PA-C  ferrous sulfate 325 (65 FE) MG tablet Take 1 tablet (325 mg total) by mouth 2 (two) times daily with a meal. 10/04/22   Angiulli, Mcarthur Rossetti, PA-C  fludrocortisone (FLORINEF) 0.1 MG tablet Take 1 tablet (0.1 mg total) by mouth daily. 10/04/22   Angiulli, Mcarthur Rossetti, PA-C  gabapentin (NEURONTIN) 400 MG capsule Take 3 capsules (1,200 mg total) by mouth 3 (three) times daily. 10/04/22   Angiulli, Mcarthur Rossetti, PA-C  loperamide (IMODIUM) 2 MG capsule Take 2 capsules (4 mg total) by mouth as needed for diarrhea or loose stools. 10/04/22   Angiulli, Mcarthur Rossetti, PA-C  magnesium oxide (MAG-OX) 400 (240 Mg) MG tablet Take 2 tablets (  800 mg total) by mouth 2 (two) times daily. 10/04/22   Angiulli, Mcarthur Rossetti, PA-C  melatonin 3 MG TABS tablet Take 1 tablet (3 mg total) by mouth at bedtime. 10/04/22   Angiulli, Mcarthur Rossetti, PA-C  methocarbamol (ROBAXIN) 500 MG tablet Take 2 tablets (1,000 mg total) by mouth every 6 (six) hours. 10/04/22   Angiulli, Mcarthur Rossetti, PA-C  Multiple Vitamin (MULTIVITAMIN WITH MINERALS) TABS tablet Take 1 tablet by mouth daily. Patient not taking: Reported on 06/30/2022 10/08/21   Joycelyn Das, MD  oxyCODONE (OXYCONTIN) 20 mg 12 hr tablet Take 3 tablets (60 mg total) by mouth every 12 (twelve) hours. Patient not taking: Reported on 10/20/2022 10/04/22   Angiulli, Mcarthur Rossetti, PA-C  Oxycodone HCl 10 MG TABS Take 1 tablet (10 mg total) by mouth every 4 (four)  hours as needed for moderate pain or severe pain (10mg  for moderate pain, 15mg  for severe pain). Patient not taking: Reported on 10/20/2022 10/04/22   Angiulli, Mcarthur Rossetti, PA-C  Calcium Polycarbophil (FIBER) 625 MG TABS Take 2 tablets (1,250 mg total) by mouth 3 (three) times daily. 10/04/22   Angiulli, Mcarthur Rossetti, PA-C  temazepam (RESTORIL) 15 MG capsule Take 1 capsule (15 mg total) by mouth at bedtime. 10/04/22   Angiulli, Mcarthur Rossetti, PA-C      Allergies    Lactose intolerance (gi)    Review of Systems   Review of Systems  Gastrointestinal:  Positive for abdominal pain.    Physical Exam Updated Vital Signs BP 108/75   Pulse 79   Temp 97.9 F (36.6 C) (Oral)   Resp 16   Ht 5\' 11"  (1.803 m)   Wt 65.8 kg   SpO2 100%   BMI 20.22 kg/m  Physical Exam Vitals and nursing note reviewed.  Constitutional:      Appearance: He is well-developed.  HENT:     Head: Normocephalic and atraumatic.  Eyes:     Pupils: Pupils are equal, round, and reactive to light.  Neck:     Vascular: No JVD.  Cardiovascular:     Rate and Rhythm: Normal rate and regular rhythm.     Heart sounds: No murmur heard.    No friction rub. No gallop.  Pulmonary:     Effort: No respiratory distress.     Breath sounds: No wheezing.  Abdominal:     General: There is no distension.     Tenderness: There is no abdominal tenderness. There is no guarding or rebound.  Musculoskeletal:        General: Normal range of motion.     Cervical back: Normal range of motion and neck supple.  Skin:    Coloration: Skin is not pale.     Findings: No rash.  Neurological:     Mental Status: He is alert and oriented to person, place, and time.  Psychiatric:        Behavior: Behavior normal.     ED Results / Procedures / Treatments   Labs (all labs ordered are listed, but only abnormal results are displayed) Labs Reviewed - No data to display  EKG None  Radiology No results found.  Procedures Procedures    Medications  Ordered in ED Medications - No data to display  ED Course/ Medical Decision Making/ A&P                             Medical Decision Making  32 yo M here for  ostomy supplies.  He is run out at home.  Was given supplies here.  Discharged home.  3:18 AM:  I have discussed the diagnosis/risks/treatment options with the patient.  Evaluation and diagnostic testing in the emergency department does not suggest an emergent condition requiring admission or immediate intervention beyond what has been performed at this time.  They will follow up with PCP. We also discussed returning to the ED immediately if new or worsening sx occur. We discussed the sx which are most concerning (e.g., sudden worsening pain, fever, inability to tolerate by mouth) that necessitate immediate return. Medications administered to the patient during their visit and any new prescriptions provided to the patient are listed below.  Medications given during this visit Medications - No data to display   The patient appears reasonably screen and/or stabilized for discharge and I doubt any other medical condition or other [facility] requiring further screening, evaluation, or treatment in the ED at this time prior to discharge.          Final Clinical Impression(s) / ED Diagnoses Final diagnoses:  Encounter for ostomy care education    Rx / DC Orders ED Discharge Orders     None         Melene Plan, DO 10/28/22 1191

## 2022-10-28 NOTE — ED Triage Notes (Signed)
Pt presents via EMS tonight because he ran out of supplies for his colostomy. Pt reports burning pain in the RLQ of abd because of the contact of the stool with his skin d/t lack of appropriate ostomy supplies. Pt currently has an emesis bag secured with tape to contain stool at RLQ of abdomen. Pt reports he has supplies ordered but they have not arrived at his home yet. Pt denies any other complaints at this time.

## 2022-10-29 NOTE — Telephone Encounter (Signed)
Per SW, I don't please HHA order- it comes from PCP as stted above- and sees them 7/15- it sounds like, medicaid might have problems doing from a specialist, I.e. me???? Not sure, unless Gillis Ends has more of an idea?- ML

## 2022-10-31 ENCOUNTER — Other Ambulatory Visit: Payer: Self-pay

## 2022-10-31 ENCOUNTER — Inpatient Hospital Stay (HOSPITAL_COMMUNITY)
Admission: EM | Admit: 2022-10-31 | Discharge: 2022-11-02 | DRG: 445 | Disposition: A | Discharge status: Home / Self Care | Payer: Medicaid Other | Attending: General Surgery | Admitting: General Surgery

## 2022-10-31 ENCOUNTER — Encounter (HOSPITAL_COMMUNITY): Payer: Self-pay

## 2022-10-31 DIAGNOSIS — Z9049 Acquired absence of other specified parts of digestive tract: Secondary | ICD-10-CM

## 2022-10-31 DIAGNOSIS — Z86718 Personal history of other venous thrombosis and embolism: Secondary | ICD-10-CM

## 2022-10-31 DIAGNOSIS — Z932 Ileostomy status: Secondary | ICD-10-CM

## 2022-10-31 DIAGNOSIS — Z87891 Personal history of nicotine dependence: Secondary | ICD-10-CM

## 2022-10-31 DIAGNOSIS — S5421XS Injury of radial nerve at forearm level, right arm, sequela: Secondary | ICD-10-CM

## 2022-10-31 DIAGNOSIS — Z91199 Patient's noncompliance with other medical treatment and regimen due to unspecified reason: Secondary | ICD-10-CM

## 2022-10-31 DIAGNOSIS — Z91011 Allergy to milk products: Secondary | ICD-10-CM

## 2022-10-31 DIAGNOSIS — W3400XS Accidental discharge from unspecified firearms or gun, sequela: Secondary | ICD-10-CM

## 2022-10-31 DIAGNOSIS — K219 Gastro-esophageal reflux disease without esophagitis: Secondary | ICD-10-CM | POA: Diagnosis present

## 2022-10-31 DIAGNOSIS — K8010 Calculus of gallbladder with chronic cholecystitis without obstruction: Principal | ICD-10-CM | POA: Diagnosis present

## 2022-10-31 DIAGNOSIS — K5010 Crohn's disease of large intestine without complications: Secondary | ICD-10-CM | POA: Diagnosis present

## 2022-10-31 DIAGNOSIS — Z8249 Family history of ischemic heart disease and other diseases of the circulatory system: Secondary | ICD-10-CM

## 2022-10-31 MED ORDER — ZINC OXIDE 40 % EX OINT
TOPICAL_OINTMENT | Freq: Once | CUTANEOUS | Status: AC
  Filled 2022-10-31: qty 57

## 2022-10-31 NOTE — ED Triage Notes (Addendum)
Pt BIBA from home with abdominal pain and severe drainage w/ burning to skin where RUQ wound ostomy is located. No ostomy bag in place.

## 2022-10-31 NOTE — ED Provider Notes (Signed)
[facility]-EMERGENCY DEPT [facility] Emergency Department Provider Note MRN:  [id number]  Arrival date & time: 11/01/22     Chief Complaint   Abdominal Pain (Pt BIBA from home with abdominal pain and severe drainage w/ burning to skin where RUQ wound ostomy is located. No ostomy bag in place.)   History of Present Illness   Shane Klein is a 32 y.o. year-old male presents to the ED with chief complaint of ostomy problem.  He has run out of supplies for his ostomy.  States that he has been having copious amounts of green discharge that are worse and different that normal.  He denies fever, chills, nausea, or vomiting.  States that the skin around the ostomy burns.  History provided by patient.   Review of Systems  Pertinent positive and negative review of systems noted in HPI.    Physical Exam   Vitals:   11/01/22 0200 11/01/22 0555  BP:  115/76  Pulse:  73  Resp:  18  Temp: 97.7 F (36.5 C) 97.6 F (36.4 C)  SpO2:  100%    CONSTITUTIONAL:  non toxic-appearing, NAD NEURO:  Alert and oriented x 3, CN 3-12 grossly intact EYES:  eyes equal and reactive ENT/NECK:  Supple, no stridor  CARDIO:  normal rate, regular rhythm, appears well-perfused  PULM:  No respiratory distress, CTAB GI/GU:  non-distended, ostomy in right abdomen with bright green discharge, surround skin is raw and erythematous MSK/SPINE:  No gross deformities, no edema, moves all extremities  SKIN:  no rash, atraumatic   *Additional and/or pertinent findings included in MDM below  Diagnostic and Interventional Summary    EKG Interpretation Date/Time:    Ventricular Rate:    PR Interval:    QRS Duration:    QT Interval:    QTC Calculation:   R Axis:      Text Interpretation:         Labs Reviewed  CBC WITH DIFFERENTIAL/PLATELET - Abnormal; Notable for the following components:      Result Value   RBC 4.20 (*)    Hemoglobin 10.9 (*)    HCT 34.0 (*)    RDW 17.7 (*)    Platelets 615 (*)     Neutro Abs 8.2 (*)    All other components within normal limits  LIPASE, BLOOD - Abnormal; Notable for the following components:   Lipase 66 (*)    All other components within normal limits  COMPREHENSIVE METABOLIC PANEL - Abnormal; Notable for the following components:   Potassium 3.4 (*)    BUN 23 (*)    Total Protein 8.8 (*)    Albumin 3.4 (*)    ALT 74 (*)    Total Bilirubin <0.1 (*)    All other components within normal limits  MAGNESIUM  CBC  CREATININE, SERUM    US Abdomen Limited RUQ (LIVER/GB)  Final Result    CT ABDOMEN PELVIS W CONTRAST  Final Result      Medications  dextrose 5 % and 0.45 % NaCl infusion (has no administration in time range)  acetaminophen (TYLENOL) tablet 650 mg (has no administration in time range)    Or  acetaminophen (TYLENOL) suppository 650 mg (has no administration in time range)  oxyCODONE (Oxy IR/ROXICODONE) immediate release tablet 5 mg (has no administration in time range)  morphine (PF) 2 MG/ML injection 2 mg (has no administration in time range)  ondansetron (ZOFRAN-ODT) disintegrating tablet 4 mg (has no administration in time range)  Or  ondansetron (ZOFRAN) injection 4 mg (has no administration in time range)  metoprolol tartrate (LOPRESSOR) injection 5 mg (has no administration in time range)  enoxaparin (LOVENOX) injection 40 mg (has no administration in time range)  cefTRIAXone (ROCEPHIN) 2 g in sodium chloride 0.9 % 100 mL IVPB (has no administration in time range)  diphenhydrAMINE (BENADRYL) capsule 25 mg (has no administration in time range)    Or  diphenhydrAMINE (BENADRYL) injection 25 mg (has no administration in time range)  liver oil-zinc oxide (DESITIN) 40 % ointment ( Topical Given 11/01/22 0012)  iohexol (OMNIPAQUE) 300 MG/ML solution 100 mL (100 mLs Intravenous Contrast Given 11/01/22 0155)  loperamide (IMODIUM) capsule 4 mg (4 mg Oral Given 11/01/22 0321)     Procedures  /  Critical Care Procedures  ED Course  and Medical Decision Making  I have reviewed the triage vital signs, the nursing notes, and pertinent available records from the EMR.  Social Determinants Affecting Complexity of Care: Patient has no clinically significant social determinants affecting this chief complaint..   ED Course:    Medical Decision Making RN reports that patient has frequent visits for poor ostomy care.  States that patient has been seen due to noncompliance with cleaning and changing the ostomy bag.  Patient does have some notable right upper quadrant tenderness.  He has bright green ostomy output.  CT shows questionable gallbladder wall thickening.  Will check ultrasound and reassess.  Ultrasound shows large gallbladder.  Will consult general surgery for recommendations.  Amount and/or Complexity of Data Reviewed Labs: ordered. Radiology: ordered.  Risk OTC drugs. Prescription drug management. Decision regarding hospitalization.         Consultants: I consulted with Dr. Sheliah Hatch, who recommends admission.   Treatment and Plan: Patient's exam and diagnostic results are concerning for cholecystitis.  Feel that patient will need admission to the hospital for further treatment and evaluation.    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

## 2022-11-01 ENCOUNTER — Encounter: Payer: Medicaid Other | Admitting: Physical Medicine and Rehabilitation

## 2022-11-01 ENCOUNTER — Emergency Department (HOSPITAL_COMMUNITY): Payer: Medicaid Other

## 2022-11-01 ENCOUNTER — Inpatient Hospital Stay (HOSPITAL_COMMUNITY): Payer: Medicaid Other

## 2022-11-01 DIAGNOSIS — S5421XS Injury of radial nerve at forearm level, right arm, sequela: Secondary | ICD-10-CM | POA: Diagnosis not present

## 2022-11-01 DIAGNOSIS — K8010 Calculus of gallbladder with chronic cholecystitis without obstruction: Secondary | ICD-10-CM | POA: Diagnosis present

## 2022-11-01 DIAGNOSIS — Z9049 Acquired absence of other specified parts of digestive tract: Secondary | ICD-10-CM | POA: Diagnosis not present

## 2022-11-01 DIAGNOSIS — Z86718 Personal history of other venous thrombosis and embolism: Secondary | ICD-10-CM | POA: Diagnosis not present

## 2022-11-01 DIAGNOSIS — K219 Gastro-esophageal reflux disease without esophagitis: Secondary | ICD-10-CM | POA: Diagnosis present

## 2022-11-01 DIAGNOSIS — Z91199 Patient's noncompliance with other medical treatment and regimen due to unspecified reason: Secondary | ICD-10-CM | POA: Diagnosis not present

## 2022-11-01 DIAGNOSIS — K5010 Crohn's disease of large intestine without complications: Secondary | ICD-10-CM | POA: Diagnosis present

## 2022-11-01 DIAGNOSIS — Z91011 Allergy to milk products: Secondary | ICD-10-CM | POA: Diagnosis not present

## 2022-11-01 DIAGNOSIS — Z87891 Personal history of nicotine dependence: Secondary | ICD-10-CM | POA: Diagnosis not present

## 2022-11-01 DIAGNOSIS — Z932 Ileostomy status: Secondary | ICD-10-CM | POA: Diagnosis not present

## 2022-11-01 DIAGNOSIS — Z8249 Family history of ischemic heart disease and other diseases of the circulatory system: Secondary | ICD-10-CM | POA: Diagnosis not present

## 2022-11-01 DIAGNOSIS — W3400XS Accidental discharge from unspecified firearms or gun, sequela: Secondary | ICD-10-CM | POA: Diagnosis not present

## 2022-11-01 LAB — CBC WITH DIFFERENTIAL/PLATELET
Abs Immature Granulocytes: 0.03 10*3/uL (ref 0.00–0.07)
Basophils Absolute: 0.1 10*3/uL (ref 0.0–0.1)
Basophils Relative: 1 %
Eosinophils Absolute: 0.2 10*3/uL (ref 0.0–0.5)
Eosinophils Relative: 2 %
HCT: 34 % — ABNORMAL LOW (ref 39.0–52.0)
Hemoglobin: 10.9 g/dL — ABNORMAL LOW (ref 13.0–17.0)
Immature Granulocytes: 0 %
Lymphocytes Relative: 13 %
Lymphs Abs: 1.3 10*3/uL (ref 0.7–4.0)
MCH: 26 pg (ref 26.0–34.0)
MCHC: 32.1 g/dL (ref 30.0–36.0)
MCV: 81 fL (ref 80.0–100.0)
Monocytes Absolute: 0.5 10*3/uL (ref 0.1–1.0)
Monocytes Relative: 5 %
Neutro Abs: 8.2 10*3/uL — ABNORMAL HIGH (ref 1.7–7.7)
Neutrophils Relative %: 79 %
Platelets: 615 10*3/uL — ABNORMAL HIGH (ref 150–400)
RBC: 4.2 MIL/uL — ABNORMAL LOW (ref 4.22–5.81)
RDW: 17.7 % — ABNORMAL HIGH (ref 11.5–15.5)
WBC: 10.3 10*3/uL (ref 4.0–10.5)
nRBC: 0 % (ref 0.0–0.2)

## 2022-11-01 LAB — COMPREHENSIVE METABOLIC PANEL
ALT: 74 U/L — ABNORMAL HIGH (ref 0–44)
AST: 39 U/L (ref 15–41)
Albumin: 3.4 g/dL — ABNORMAL LOW (ref 3.5–5.0)
Alkaline Phosphatase: 95 U/L (ref 38–126)
Anion gap: 7 (ref 5–15)
BUN: 23 mg/dL — ABNORMAL HIGH (ref 6–20)
CO2: 24 mmol/L (ref 22–32)
Calcium: 9.5 mg/dL (ref 8.9–10.3)
Chloride: 106 mmol/L (ref 98–111)
Creatinine, Ser: 0.69 mg/dL (ref 0.61–1.24)
GFR, Estimated: >60 mL/min (ref >60)
Glucose, Bld: 95 mg/dL (ref 70–99)
Potassium: 3.4 mmol/L — ABNORMAL LOW (ref 3.5–5.1)
Sodium: 137 mmol/L (ref 135–145)
Total Bilirubin: <0.1 mg/dL — ABNORMAL LOW (ref 0.3–1.2)
Total Protein: 8.8 g/dL — ABNORMAL HIGH (ref 6.5–8.1)

## 2022-11-01 LAB — APTT: aPTT: 46 seconds — ABNORMAL HIGH (ref 24–36)

## 2022-11-01 LAB — CBC
HCT: 32.8 % — ABNORMAL LOW (ref 39.0–52.0)
Hemoglobin: 10.4 g/dL — ABNORMAL LOW (ref 13.0–17.0)
MCH: 25.7 pg — ABNORMAL LOW (ref 26.0–34.0)
MCHC: 31.7 g/dL (ref 30.0–36.0)
MCV: 81 fL (ref 80.0–100.0)
Platelets: 565 10*3/uL — ABNORMAL HIGH (ref 150–400)
RBC: 4.05 MIL/uL — ABNORMAL LOW (ref 4.22–5.81)
RDW: 17.6 % — ABNORMAL HIGH (ref 11.5–15.5)
WBC: 9 10*3/uL (ref 4.0–10.5)
nRBC: 0 % (ref 0.0–0.2)

## 2022-11-01 LAB — MAGNESIUM: Magnesium: 1.3 mg/dL — ABNORMAL LOW (ref 1.7–2.4)

## 2022-11-01 LAB — LIPASE, BLOOD: Lipase: 66 U/L — ABNORMAL HIGH (ref 11–51)

## 2022-11-01 LAB — CREATININE, SERUM
Creatinine, Ser: 0.65 mg/dL (ref 0.61–1.24)
GFR, Estimated: >60 mL/min (ref >60)

## 2022-11-01 LAB — HEPARIN LEVEL (UNFRACTIONATED): Heparin Unfractionated: 0.36 IU/mL (ref 0.30–0.70)

## 2022-11-01 MED ORDER — DIPHENHYDRAMINE HCL 25 MG PO CAPS: 25.0000 mg | ORAL_CAPSULE | Freq: Four times a day (QID) | ORAL | Status: DC | PRN

## 2022-11-01 MED ORDER — ACETAMINOPHEN 325 MG PO TABS: 650.0000 mg | ORAL_TABLET | Freq: Four times a day (QID) | ORAL | Status: DC | PRN

## 2022-11-01 MED ORDER — FERROUS SULFATE 325 (65 FE) MG PO TABS
325.0000 mg | ORAL_TABLET | Freq: Two times a day (BID) | ORAL | Status: DC
  Administered 2022-11-01 – 2022-11-02 (×3): 325 mg via ORAL
  Filled 2022-11-01 (×3): qty 1

## 2022-11-01 MED ORDER — CALCIUM POLYCARBOPHIL 625 MG PO TABS
625.0000 mg | ORAL_TABLET | Freq: Two times a day (BID) | ORAL | Status: DC
  Administered 2022-11-01 (×2): 625 mg via ORAL
  Filled 2022-11-01 (×3): qty 1

## 2022-11-01 MED ORDER — SODIUM CHLORIDE 0.9 % IV SOLN
2.0000 g | Freq: Every day | INTRAVENOUS | Status: DC
  Administered 2022-11-01: 2 g via INTRAVENOUS
  Filled 2022-11-01: qty 20

## 2022-11-01 MED ORDER — DIPHENHYDRAMINE HCL 50 MG/ML IJ SOLN: 25.0000 mg | Freq: Four times a day (QID) | INTRAMUSCULAR | Status: DC | PRN

## 2022-11-01 MED ORDER — ONDANSETRON HCL 4 MG/2ML IJ SOLN: 4.0000 mg | Freq: Four times a day (QID) | INTRAMUSCULAR | Status: DC | PRN

## 2022-11-01 MED ORDER — LOPERAMIDE HCL 2 MG PO CAPS
2.0000 mg | ORAL_CAPSULE | Freq: Two times a day (BID) | ORAL | Status: DC
  Administered 2022-11-01 (×2): 2 mg via ORAL
  Filled 2022-11-01 (×3): qty 1

## 2022-11-01 MED ORDER — METOPROLOL TARTRATE 5 MG/5ML IV SOLN: 5.0000 mg | Freq: Four times a day (QID) | INTRAVENOUS | Status: DC | PRN

## 2022-11-01 MED ORDER — ZINC OXIDE 40 % EX OINT: TOPICAL_OINTMENT | CUTANEOUS | Status: DC | PRN

## 2022-11-01 MED ORDER — MORPHINE SULFATE (PF) 2 MG/ML IV SOLN: 2.0000 mg | INTRAVENOUS | Status: DC | PRN

## 2022-11-01 MED ORDER — IOHEXOL 300 MG/ML  SOLN
100.0000 mL | Freq: Once | INTRAMUSCULAR | Status: AC | PRN
  Administered 2022-11-01: 100 mL via INTRAVENOUS

## 2022-11-01 MED ORDER — ACETAMINOPHEN 650 MG RE SUPP: 650.0000 mg | Freq: Four times a day (QID) | RECTAL | Status: DC | PRN

## 2022-11-01 MED ORDER — OXYCODONE HCL 5 MG PO TABS: 5.0000 mg | ORAL_TABLET | ORAL | Status: DC | PRN

## 2022-11-01 MED ORDER — ENOXAPARIN SODIUM 40 MG/0.4ML IJ SOSY
40.0000 mg | PREFILLED_SYRINGE | INTRAMUSCULAR | Status: DC
  Administered 2022-11-01: 40 mg via SUBCUTANEOUS
  Filled 2022-11-01: qty 0.4

## 2022-11-01 MED ORDER — HEPARIN (PORCINE) 25000 UT/250ML-% IV SOLN
1300.0000 [IU]/h | INTRAVENOUS | Status: DC
  Administered 2022-11-01: 1150 [IU]/h via INTRAVENOUS
  Administered 2022-11-02: 1300 [IU]/h via INTRAVENOUS
  Filled 2022-11-01 (×2): qty 250

## 2022-11-01 MED ORDER — OXYCODONE HCL 5 MG PO TABS
5.0000 mg | ORAL_TABLET | ORAL | Status: DC | PRN
  Administered 2022-11-01: 5 mg via ORAL
  Filled 2022-11-01: qty 1

## 2022-11-01 MED ORDER — LOPERAMIDE HCL 2 MG PO CAPS
4.0000 mg | ORAL_CAPSULE | Freq: Once | ORAL | Status: AC
  Administered 2022-11-01: 4 mg via ORAL
  Filled 2022-11-01: qty 2

## 2022-11-01 MED ORDER — POTASSIUM CHLORIDE CRYS ER 20 MEQ PO TBCR
40.0000 meq | EXTENDED_RELEASE_TABLET | Freq: Once | ORAL | Status: AC
  Administered 2022-11-01: 40 meq via ORAL
  Filled 2022-11-01: qty 4

## 2022-11-01 MED ORDER — SODIUM CHLORIDE 0.9 % IV SOLN: INTRAVENOUS | Status: DC

## 2022-11-01 MED ORDER — DEXTROSE-SODIUM CHLORIDE 5-0.45 % IV SOLN: INTRAVENOUS | Status: DC

## 2022-11-01 MED ORDER — HEPARIN BOLUS VIA INFUSION
1000.0000 [IU] | Freq: Once | INTRAVENOUS | Status: AC
  Administered 2022-11-01: 1000 [IU] via INTRAVENOUS
  Filled 2022-11-01: qty 1000

## 2022-11-01 MED ORDER — ONDANSETRON 4 MG PO TBDP: 4.0000 mg | ORAL_TABLET | Freq: Four times a day (QID) | ORAL | Status: DC | PRN
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
## 2022-11-01 NOTE — Progress Notes (Signed)
OT Cancellation Note  Patient Details Name: KLYDE WIESEL MRN: [id number] DOB: 08/15/1990   Cancelled Treatment:    Reason Eval/Treat Not Completed: Patient not medically ready Patient noted to have started Heparin at 2pm today. Therapy guidelines indicate patient is not able to participate in therapy until after the 24 hour window of initiation. OT to continue to follow. Rosalio Loud, MS Acute Rehabilitation Department Office# [phone]  11/01/2022, 3:35 PM

## 2022-11-01 NOTE — Consult Note (Addendum)
WOC Nurse ostomy consult note Consult requested for assistance with leaking ostomy pouch.  Pt is familiar to [facility] team from multiple previous admissions, most recently on 6/17.  He has a high output ileostomy from surgery in March 2024.  His mother is independent with pouch application and emptying from multiple previous teaching sessions, according to the patient. she is not present today. Pt states he cannot apply the pouch related to limited arm mobility.  He states he assists with emptying the pouch at home. Pt and mother were scheduled to be seen at the outpatient ostomy clinic 3 separate previous occasions and did not show.  Stoma type/location: High output ileostomy requires frequent emptying or it will overfill and leak.  Current pouch is intact with good seal and mod amt green liquid stool; Pt states it was applied 2 hours ago in the ED and he had 3 pouch changes there during the night.  Stomal assessment/size: Stoma is red and viable, appears to be flush with skin level when visualized through the pouch Peristomal assessment: Pt states peristomal skin was red, painful and macerated prior to the pouch application due to frequent pouch leakages. Pt is familiar with crusting technique to attempt to protect the skin and can verbalize the steps using powder and skin prep.  Ostomy pouching: Currently there is a 2 piece pouch in place.  Enrolled patient in [employer] DC program: Yes, during a previous admission.  Instructions provided for bedside nurses to perform as follows: PLEASE CHECK POUCH EVERY HOUR AND EMPTY IF HALF-FULL. PT HAS HIGH OUTPUT OSTOMY AND POUCH WILL LEAK IF OVERFILLED. Empty pouch when 1/3  to  full of stool Clean bottom 2-inches of fecal pouch prior to resealing Use supplies: barrier ring, [place] # [phone], wafer [place] # [id number], high output pouch [place] # [id number]. 4 sets of each supply left at the bedside for staff nurse's use.   BEDSIDE NURSE: PLEASE CHANGE POUCH PRN IF  LEAKING AS FOLLOWS: 1. Remove previous pouch and cleanse skin with warm washcloth, pat dry. 2. Apply stoma powder to skin surrounding stoma, then wipe with skin prep to remove excess powder 3. Apply barrier ring to the back of the wafer that has been cut out, then clip together with high output pouch. 4. Attach high output pouch to bedside drainage bag 5. Apply tape around pouch edges.  Please re-consult if further assistance is needed.  Thank-you,  Cammie Mcgee MSN, RN, CWOCN, [place], CNS [phone]
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
## 2022-11-01 NOTE — ED Notes (Signed)
RUQ Ileostomy changed x3, size reduced to 22", and still continues to leak.
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
## 2022-11-01 NOTE — Progress Notes (Signed)
Pt. Declined another PIV, RN notified.

## 2022-11-01 NOTE — ED Notes (Signed)
Wound ostomy care provided w/ a 29" 1 piece hollister bag applied

## 2022-11-01 NOTE — Progress Notes (Addendum)
PT Cancellation Note  Patient Details Name: Shane Klein MRN: [id number] DOB: [birth year]   Cancelled Treatment:    Reason Eval/Treat Not Completed: Other (comment)  Pt admitted 16 hr at this time.  Noted in surgical note that trialing PO and if unable to tolerate will be NPO for HIDA scan.  Pt just ate jello and is not wanting to mobilize at this time hopeful to tolerate PO intake. Will f/u later date.  Did note at outpt PT eval on 10/20/22 pt was WBAT R elbow only but unsure how much longer he had those restrictions.   Anise Salvo, PT Acute Rehab [facility] [phone]  Shane Klein 11/01/2022, 3:00 PM

## 2022-11-02 LAB — COMPREHENSIVE METABOLIC PANEL
ALT: 45 U/L — ABNORMAL HIGH (ref 0–44)
AST: 20 U/L (ref 15–41)
Albumin: 2.8 g/dL — ABNORMAL LOW (ref 3.5–5.0)
Alkaline Phosphatase: 79 U/L (ref 38–126)
Anion gap: 6 (ref 5–15)
BUN: 15 mg/dL (ref 6–20)
CO2: 22 mmol/L (ref 22–32)
Calcium: 9.2 mg/dL (ref 8.9–10.3)
Chloride: 109 mmol/L (ref 98–111)
Creatinine, Ser: 0.59 mg/dL — ABNORMAL LOW (ref 0.61–1.24)
GFR, Estimated: >60 mL/min (ref >60)
Glucose, Bld: 77 mg/dL (ref 70–99)
Potassium: 4.1 mmol/L (ref 3.5–5.1)
Sodium: 137 mmol/L (ref 135–145)
Total Bilirubin: 0.2 mg/dL — ABNORMAL LOW (ref 0.3–1.2)
Total Protein: 7.2 g/dL (ref 6.5–8.1)

## 2022-11-02 LAB — CBC
HCT: 30.6 % — ABNORMAL LOW (ref 39.0–52.0)
Hemoglobin: 9.7 g/dL — ABNORMAL LOW (ref 13.0–17.0)
MCH: 26.1 pg (ref 26.0–34.0)
MCHC: 31.7 g/dL (ref 30.0–36.0)
MCV: 82.5 fL (ref 80.0–100.0)
Platelets: 462 10*3/uL — ABNORMAL HIGH (ref 150–400)
RBC: 3.71 MIL/uL — ABNORMAL LOW (ref 4.22–5.81)
RDW: 17.6 % — ABNORMAL HIGH (ref 11.5–15.5)
WBC: 7.2 10*3/uL (ref 4.0–10.5)
nRBC: 0 % (ref 0.0–0.2)

## 2022-11-02 LAB — LIPASE, BLOOD: Lipase: 38 U/L (ref 11–51)

## 2022-11-02 LAB — APTT: aPTT: 67 seconds — ABNORMAL HIGH (ref 24–36)

## 2022-11-02 LAB — HEPARIN LEVEL (UNFRACTIONATED): Heparin Unfractionated: 0.38 IU/mL (ref 0.30–0.70)

## 2022-11-02 MED ORDER — AMOXICILLIN-POT CLAVULANATE 875-125 MG PO TABS: 1.0000 | ORAL_TABLET | Freq: Two times a day (BID) | ORAL | 0 refills | Status: AC

## 2022-11-02 MED ORDER — LOPERAMIDE HCL 2 MG PO CAPS
2.0000 mg | ORAL_CAPSULE | Freq: Three times a day (TID) | ORAL | Status: DC
  Administered 2022-11-02 (×2): 2 mg via ORAL
  Filled 2022-11-02: qty 1

## 2022-11-02 MED ORDER — CALCIUM POLYCARBOPHIL 625 MG PO TABS
1250.0000 mg | ORAL_TABLET | Freq: Two times a day (BID) | ORAL | Status: DC
  Administered 2022-11-02: 1250 mg via ORAL

## 2022-11-02 MED ORDER — AMOXICILLIN-POT CLAVULANATE 875-125 MG PO TABS
1.0000 | ORAL_TABLET | Freq: Two times a day (BID) | ORAL | Status: DC
  Administered 2022-11-02: 1 via ORAL
  Filled 2022-11-02: qty 1

## 2022-11-02 MED ORDER — LOPERAMIDE HCL 2 MG PO CAPS: 2.0000 mg | ORAL_CAPSULE | Freq: Three times a day (TID) | ORAL | 0 refills | Status: DC

## 2022-11-02 MED ORDER — OXYCODONE HCL 5 MG PO TABS: 5.0000 mg | ORAL_TABLET | Freq: Three times a day (TID) | ORAL | Status: DC | PRN
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
## 2022-11-02 NOTE — TOC CM/SW Note (Signed)
Alerted by RN that pt requiring dc transportation assistance.  Pt with incomplete paraplegia and requiring [facility] transport.  Have called [facility] at 1500.  RN aware.  No further TOC needs.
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
## 2022-11-02 NOTE — Progress Notes (Signed)
Reviewed written d/c instructions with pt and all questions answered. Pt verbalized understanding. Pt awaiting [facility] to arrive for D/C.
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
## 2022-11-02 NOTE — Progress Notes (Signed)
OT Cancellation Note  Patient Details Name: Shane Klein MRN: [id number] DOB: 03-Apr-1991   Cancelled Treatment:    Reason Eval/Treat Not Completed: Other (comment) OT started evaluation with nurse entering room reporting that patient was being discharged with patient endorsing being ready to d/c. Patient reported not needing OT in next level of care. OT to sign off at this time.  Rosalio Loud, MS Acute Rehabilitation Department Office# [phone]  11/02/2022, 2:06 PM

## 2022-11-03 ENCOUNTER — Ambulatory Visit: Payer: Medicaid Other | Admitting: Physical Therapy

## 2022-11-03 ENCOUNTER — Telehealth: Payer: Self-pay

## 2022-11-03 NOTE — Transitions of Care (Post Inpatient/ED Visit) (Signed)
   11/03/2022  Name: TAIVEN GREENLEY MRN: [id number] DOB: 1990-07-27  Today's TOC FU Call Status:    Attempted to reach the patient regarding the most recent Inpatient/ED visit.  Follow Up Plan: Additional outreach attempts will be made to reach the patient to complete the Transitions of Care (Post Inpatient/ED visit) call.   Signature  Robyne Peers, RN

## 2022-11-04 ENCOUNTER — Telehealth: Payer: Self-pay
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
## 2022-11-04 NOTE — Transitions of Care (Post Inpatient/ED Visit) (Signed)
   11/04/2022  Name: Shane Klein MRN: [id number] DOB: 14-Nov-1990  Today's TOC FU Call Status: Today's TOC FU Call Status:: Unsuccessful Call (2nd Attempt) Unsuccessful Call (1st Attempt) Date: 11/03/22 Unsuccessful Call (2nd Attempt) Date: 11/04/22  Attempted to reach the patient regarding the most recent Inpatient/ED visit.  Follow Up Plan: Additional outreach attempts will be made to reach the patient to complete the Transitions of Care (Post Inpatient/ED visit) call.    The patient has an appointment with Gwinda Passe, NP at [facility] on 11/08/2022.  Signature  Audie Box

## 2022-11-05 ENCOUNTER — Ambulatory Visit (HOSPITAL_COMMUNITY): Payer: Medicaid Other | Admitting: Nurse Practitioner

## 2022-11-08 ENCOUNTER — Telehealth: Payer: Self-pay

## 2022-11-08 ENCOUNTER — Encounter (INDEPENDENT_AMBULATORY_CARE_PROVIDER_SITE_OTHER): Payer: Self-pay | Admitting: Primary Care

## 2022-11-08 ENCOUNTER — Ambulatory Visit (INDEPENDENT_AMBULATORY_CARE_PROVIDER_SITE_OTHER): Payer: Medicaid Other | Admitting: Primary Care

## 2022-11-08 VITALS — BP 113/75 | HR 80 | Resp 16

## 2022-11-08 DIAGNOSIS — G894 Chronic pain syndrome: Secondary | ICD-10-CM | POA: Diagnosis not present

## 2022-11-08 DIAGNOSIS — E440 Moderate protein-calorie malnutrition: Secondary | ICD-10-CM | POA: Diagnosis not present

## 2022-11-08 DIAGNOSIS — K50812 Crohn's disease of both small and large intestine with intestinal obstruction: Secondary | ICD-10-CM | POA: Diagnosis not present
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
## 2022-11-09 ENCOUNTER — Other Ambulatory Visit (HOSPITAL_COMMUNITY): Payer: Self-pay | Admitting: Nurse Practitioner

## 2022-11-09 ENCOUNTER — Other Ambulatory Visit: Payer: Medicaid Other

## 2022-11-09 ENCOUNTER — Ambulatory Visit (HOSPITAL_COMMUNITY)
Admission: RE | Admit: 2022-11-09 | Discharge: 2022-11-09 | Disposition: A | Discharge status: Home / Self Care | Payer: Medicaid Other | Source: Ambulatory Visit | Attending: Physician Assistant | Admitting: Physician Assistant

## 2022-11-09 DIAGNOSIS — L24B3 Irritant contact dermatitis related to fecal or urinary stoma or fistula: Secondary | ICD-10-CM | POA: Diagnosis not present

## 2022-11-09 DIAGNOSIS — Z932 Ileostomy status: Secondary | ICD-10-CM | POA: Diagnosis not present

## 2022-11-09 DIAGNOSIS — R198 Other specified symptoms and signs involving the digestive system and abdomen: Secondary | ICD-10-CM | POA: Diagnosis not present

## 2022-11-09 DIAGNOSIS — Z91199 Patient's noncompliance with other medical treatment and regimen due to unspecified reason: Secondary | ICD-10-CM | POA: Diagnosis not present

## 2022-11-09 DIAGNOSIS — Z432 Encounter for attention to ileostomy: Secondary | ICD-10-CM | POA: Insufficient documentation

## 2022-11-09 MED ORDER — LOPERAMIDE HCL 2 MG PO TABS: 4.0000 mg | ORAL_TABLET | Freq: Four times a day (QID) | ORAL | 0 refills | Status: AC | PRN
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
## 2022-11-09 NOTE — Patient Instructions (Signed)
Visit Information  [name] was given information about Medicaid Managed Care team care coordination services as a part of their Healthy [facility] Medicaid benefit. Shane Klein verbally consented to engagement with the [facility] Managed Care team.   If you are experiencing a medical emergency, please call 911 or report to your local emergency department or urgent care.   If you have a non-emergency medical problem during routine business hours, please contact your provider's office and ask to speak with a nurse.   For questions related to your Healthy [facility] health plan, please call: [phone] or visit the homepage here: [email or web address]  If you would like to schedule transportation through your Healthy [facility] plan, please call the following number at least 2 days in advance of your appointment: [phone]  For information about your ride after you set it up, call Ride Assist at [phone]. Use this number to activate a Will Call pickup, or if your transportation is late for a scheduled pickup. Use this number, too, if you need to make a change or cancel a previously scheduled reservation.  If you need transportation services right away, call [phone]. The after-hours call center is staffed 24 hours to handle ride assistance and urgent reservation requests (including discharges) 365 days a year. Urgent trips include sick visits, hospital discharge requests and life-sustaining treatment.  Call the [facility] Line at [phone], at any time, 24 hours a day, 7 days a week. If you are in danger or need immediate medical attention call 911.  If you would like help to quit smoking, call 1-800-QUIT-NOW (906-561-4433) OR Espaol: 1-855-Djelo-Ya (7-564-332-9518) o para ms informacin haga clic aqu or Text READY to 841-660 to register via text  Shane Klein - following are the goals we discussed in your visit today:   Goals  Addressed   None       Social Worker will follow up in 30 days.   Shane Klein, Shane Klein, MHA [facility]  Managed Medicaid Social Worker [phone]   Following is a copy of your plan of care:  There are no care plans that you recently modified to display for this patient.
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
## 2022-11-10 ENCOUNTER — Other Ambulatory Visit (HOSPITAL_COMMUNITY): Payer: Self-pay | Admitting: Nurse Practitioner

## 2022-11-10 ENCOUNTER — Ambulatory Visit: Payer: Medicaid Other | Attending: Physician Assistant | Admitting: Physical Therapy

## 2022-11-10 ENCOUNTER — Ambulatory Visit: Payer: Medicaid Other | Admitting: Occupational Therapy

## 2022-11-10 DIAGNOSIS — Z91199 Patient's noncompliance with other medical treatment and regimen due to unspecified reason: Secondary | ICD-10-CM | POA: Insufficient documentation

## 2022-11-10 DIAGNOSIS — R198 Other specified symptoms and signs involving the digestive system and abdomen: Secondary | ICD-10-CM

## 2022-11-10 DIAGNOSIS — L24B3 Irritant contact dermatitis related to fecal or urinary stoma or fistula: Secondary | ICD-10-CM | POA: Insufficient documentation

## 2022-11-10 NOTE — Discharge Instructions (Signed)
Set up with [facility] 

## 2022-11-14 ENCOUNTER — Telehealth: Payer: Medicaid Other | Admitting: Physician Assistant

## 2022-11-14 ENCOUNTER — Encounter: Payer: Self-pay | Admitting: Physical Medicine and Rehabilitation

## 2022-11-14 ENCOUNTER — Encounter (HOSPITAL_COMMUNITY): Payer: Self-pay | Admitting: Emergency Medicine

## 2022-11-14 ENCOUNTER — Emergency Department (HOSPITAL_COMMUNITY)
Admission: EM | Admit: 2022-11-14 | Discharge: 2022-11-14 | Disposition: A | Discharge status: Home / Self Care | Payer: Medicaid Other | Attending: Emergency Medicine | Admitting: Emergency Medicine

## 2022-11-14 ENCOUNTER — Other Ambulatory Visit: Payer: Self-pay

## 2022-11-14 DIAGNOSIS — Z7982 Long term (current) use of aspirin: Secondary | ICD-10-CM | POA: Diagnosis not present

## 2022-11-14 DIAGNOSIS — Z432 Encounter for attention to ileostomy: Secondary | ICD-10-CM | POA: Insufficient documentation

## 2022-11-14 DIAGNOSIS — Z7901 Long term (current) use of anticoagulants: Secondary | ICD-10-CM | POA: Insufficient documentation

## 2022-11-14 DIAGNOSIS — G8929 Other chronic pain: Secondary | ICD-10-CM

## 2022-11-14 DIAGNOSIS — M5441 Lumbago with sciatica, right side: Secondary | ICD-10-CM | POA: Diagnosis not present

## 2022-11-14 DIAGNOSIS — R109 Unspecified abdominal pain: Secondary | ICD-10-CM | POA: Diagnosis not present

## 2022-11-14 MED ORDER — LIDOCAINE 4 % EX PTCH
1.0000 | MEDICATED_PATCH | CUTANEOUS | 0 refills | Status: AC
Start: 2022-11-14 — End: ?

## 2022-11-14 MED ORDER — METHYLPREDNISOLONE 4 MG PO TBPK: ORAL_TABLET | ORAL | 0 refills | Status: DC

## 2022-11-14 NOTE — Patient Instructions (Signed)
Shane Klein, thank you for joining Shane Loveless, PA-C for today's virtual visit.  While this provider is not your primary care provider (PCP), if your PCP is located in our provider database this encounter information will be shared with them immediately following your visit.   A [facility] MyChart account gives you access to today's visit and all your visits, tests, and labs performed at [facility] " click here if you don't have a [facility] MyChart account or go to [email or web address]  Consent: (Patient) Shane Klein provided verbal consent for this virtual visit at the beginning of the encounter.  Current Medications:  Current Outpatient Medications:    lidocaine (HM LIDOCAINE PATCH) 4 %, Place 1 patch onto the skin daily., Disp: 30 patch, Rfl: 0   methylPREDNISolone (MEDROL DOSEPAK) 4 MG TBPK tablet, 6 day taper; take as directed on package, Disp: 21 tablet, Rfl: 0   acetaminophen (TYLENOL) 325 MG tablet, Take 1 tablet (325 mg total) by mouth every 6 (six) hours as needed for mild pain., Disp: , Rfl:    apixaban (ELIQUIS) 5 MG TABS tablet, Take 1 tablet (5 mg total) by mouth 2 (two) times daily., Disp: 60 tablet, Rfl: 0   ascorbic acid (VITAMIN C) 1000 MG tablet, Take 1 tablet (1,000 mg total) by mouth daily., Disp: 100 tablet, Rfl: 0   aspirin EC 81 MG tablet, Take 1 tablet (81 mg total) by mouth daily. Swallow whole., Disp: 30 tablet, Rfl: 12   Baclofen 5 MG TABS, Take 1 tablet (5 mg total) by mouth 3 (three) times daily., Disp: 90 tablet, Rfl: 0   busPIRone (BUSPAR) 10 MG tablet, Take 1.5 tablets (15 mg total) by mouth 3 (three) times daily., Disp: 135 tablet, Rfl: 0   Calcium Polycarbophil (FIBER) 625 MG TABS, Take 2 tablets (1,250 mg total) by mouth 3 (three) times daily., Disp: 180 tablet, Rfl: 0   cyanocobalamin 1000 MCG tablet, Take 1 tablet (1,000 mcg total) by mouth daily., Disp: 30 tablet, Rfl: 0   cyproheptadine (PERIACTIN) 4 MG tablet, Take  1 tablet (4 mg total) by mouth at bedtime., Disp: 30 tablet, Rfl: 0   diclofenac Sodium (VOLTAREN) 1 % GEL, Apply 2 g topically 4 (four) times daily., Disp: 200 g, Rfl: 0   doxazosin (CARDURA) 2 MG tablet, Take 1 tablet (2 mg total) by mouth every 12 (twelve) hours., Disp: 60 tablet, Rfl: 0   DULoxetine (CYMBALTA) 60 MG capsule, Take 2 capsules (120 mg total) by mouth at bedtime., Disp: 60 capsule, Rfl: 0   famotidine (PEPCID) 20 MG tablet, Take 1 tablet (20 mg total) by mouth 2 (two) times daily., Disp: 60 tablet, Rfl: 0   ferrous sulfate 325 (65 FE) MG tablet, Take 1 tablet (325 mg total) by mouth 2 (two) times daily with a meal., Disp: 100 tablet, Rfl: 0   fludrocortisone (FLORINEF) 0.1 MG tablet, Take 1 tablet (0.1 mg total) by mouth daily., Disp: 30 tablet, Rfl: 0   gabapentin (NEURONTIN) 400 MG capsule, Take 3 capsules (1,200 mg total) by mouth 3 (three) times daily., Disp: 180 capsule, Rfl: 0   loperamide (IMODIUM A-D) 2 MG tablet, Take 2 tablets (4 mg total) by mouth 4 (four) times daily as needed for diarrhea or loose stools., Disp: 30 tablet, Rfl: 0   loperamide (IMODIUM) 2 MG capsule, Take 1 capsule (2 mg total) by mouth 3 (three) times daily., Disp: 30 capsule, Rfl: 0   magnesium oxide (MAG-OX) 400 (240 Mg) MG  tablet, Take 2 tablets (800 mg total) by mouth 2 (two) times daily., Disp: 120 tablet, Rfl: 0   melatonin 3 MG TABS tablet, Take 1 tablet (3 mg total) by mouth at bedtime., Disp: 60 tablet, Rfl: 0   methocarbamol (ROBAXIN) 500 MG tablet, Take 2 tablets (1,000 mg total) by mouth every 6 (six) hours., Disp: 180 tablet, Rfl: 0   NARCAN 4 MG/0.1ML LIQD nasal spray kit, Place 1 spray into the nose as needed (accidental overdose)., Disp: , Rfl:    temazepam (RESTORIL) 15 MG capsule, Take 1 capsule (15 mg total) by mouth at bedtime., Disp: 30 capsule, Rfl: 0   Vitamin D, Ergocalciferol, (DRISDOL) 1.25 MG (50000 UNIT) CAPS capsule, Take 50,000 Units by mouth once a week., Disp: , Rfl:     vitamin D3 (CHOLECALCIFEROL) 25 MCG tablet, Take 2 tablets (2,000 Units total) by mouth 2 (two) times daily., Disp: 60 tablet, Rfl: 0   Medications ordered in this encounter:  Meds ordered this encounter  Medications   methylPREDNISolone (MEDROL DOSEPAK) 4 MG TBPK tablet    Sig: 6 day taper; take as directed on package    Dispense:  21 tablet    Refill:  0    Order Specific Question:   Supervising Provider    Answer:   Merrilee Jansky [[id number]]   lidocaine (HM LIDOCAINE PATCH) 4 %    Sig: Place 1 patch onto the skin daily.    Dispense:  30 patch    Refill:  0    Order Specific Question:   Supervising Provider    Answer:   Merrilee Jansky [id number]     *If you need refills on other medications prior to your next appointment, please contact your pharmacy*  Follow-Up: Call back or seek an in-person evaluation if the symptoms worsen or if the condition fails to improve as anticipated.  [facility] [phone]   If you have been instructed to have an in-person evaluation today at a local Urgent Care facility, please use the link below. It will take you to a list of all of our available [facility], including address, phone number and hours of operation. Please do not delay care.  [facility]  If you or a family member do not have a primary care provider, use the link below to schedule a visit and establish care. When you choose a [facility] primary care physician or advanced practice provider, you gain a long-term partner in health. Find a Primary Care Provider  Learn more about [facility]'s in-office and virtual care options:  - Get Care Now

## 2022-11-14 NOTE — ED Provider Notes (Signed)
[place] EMERGENCY DEPARTMENT AT [facility] Provider Note   CSN: [id number] Arrival date & time: 11/14/22  0920     History  Chief Complaint  Patient presents with   Abdominal Pain    Shane Klein is a 32 y.o. male.  32 year old male presents today for colostomy bag malfunction.  He reports that it is leaking.  Denies any fever, purulent drainage.  He states the drainage depends on what he eats.  No redness.  Requesting a new bag.  The history is provided by the patient. No language interpreter was used.       Home Medications Prior to Admission medications   Medication Sig Start Date End Date Taking? Authorizing Provider  acetaminophen (TYLENOL) 325 MG tablet Take 1 tablet (325 mg total) by mouth every 6 (six) hours as needed for mild pain. 10/04/22   Angiulli, Mcarthur Rossetti, PA-C  apixaban (ELIQUIS) 5 MG TABS tablet Take 1 tablet (5 mg total) by mouth 2 (two) times daily. 10/04/22   Angiulli, Mcarthur Rossetti, PA-C  ascorbic acid (VITAMIN C) 1000 MG tablet Take 1 tablet (1,000 mg total) by mouth daily. 10/04/22   Angiulli, Mcarthur Rossetti, PA-C  aspirin EC 81 MG tablet Take 1 tablet (81 mg total) by mouth daily. Swallow whole. 10/04/22   Angiulli, Mcarthur Rossetti, PA-C  Baclofen 5 MG TABS Take 1 tablet (5 mg total) by mouth 3 (three) times daily. 10/04/22   Angiulli, Mcarthur Rossetti, PA-C  busPIRone (BUSPAR) 10 MG tablet Take 1.5 tablets (15 mg total) by mouth 3 (three) times daily. 10/04/22   Angiulli, Mcarthur Rossetti, PA-C  Calcium Polycarbophil (FIBER) 625 MG TABS Take 2 tablets (1,250 mg total) by mouth 3 (three) times daily. 10/04/22   Angiulli, Mcarthur Rossetti, PA-C  cyanocobalamin 1000 MCG tablet Take 1 tablet (1,000 mcg total) by mouth daily. 10/04/22   Angiulli, Mcarthur Rossetti, PA-C  cyproheptadine (PERIACTIN) 4 MG tablet Take 1 tablet (4 mg total) by mouth at bedtime. 10/04/22   Angiulli, Mcarthur Rossetti, PA-C  diclofenac Sodium (VOLTAREN) 1 % GEL Apply 2 g topically 4 (four) times daily. 10/04/22   Angiulli, Mcarthur Rossetti,  PA-C  doxazosin (CARDURA) 2 MG tablet Take 1 tablet (2 mg total) by mouth every 12 (twelve) hours. 10/04/22   Angiulli, Mcarthur Rossetti, PA-C  DULoxetine (CYMBALTA) 60 MG capsule Take 2 capsules (120 mg total) by mouth at bedtime. 10/04/22   Angiulli, Mcarthur Rossetti, PA-C  famotidine (PEPCID) 20 MG tablet Take 1 tablet (20 mg total) by mouth 2 (two) times daily. 10/04/22   Angiulli, Mcarthur Rossetti, PA-C  ferrous sulfate 325 (65 FE) MG tablet Take 1 tablet (325 mg total) by mouth 2 (two) times daily with a meal. 10/04/22   Angiulli, Mcarthur Rossetti, PA-C  fludrocortisone (FLORINEF) 0.1 MG tablet Take 1 tablet (0.1 mg total) by mouth daily. 10/04/22   Angiulli, Mcarthur Rossetti, PA-C  gabapentin (NEURONTIN) 400 MG capsule Take 3 capsules (1,200 mg total) by mouth 3 (three) times daily. 10/04/22   Angiulli, Mcarthur Rossetti, PA-C  loperamide (IMODIUM A-D) 2 MG tablet Take 2 tablets (4 mg total) by mouth 4 (four) times daily as needed for diarrhea or loose stools. 11/09/22 12/09/22  Mayo, Eugenio Hoes, FNP  loperamide (IMODIUM) 2 MG capsule Take 1 capsule (2 mg total) by mouth 3 (three) times daily. 11/02/22   Barnetta Chapel, PA-C  magnesium oxide (MAG-OX) 400 (240 Mg) MG tablet Take 2 tablets (800 mg total) by mouth 2 (two) times daily. 10/04/22  Angiulli, Mcarthur Rossetti, PA-C  melatonin 3 MG TABS tablet Take 1 tablet (3 mg total) by mouth at bedtime. 10/04/22   Angiulli, Mcarthur Rossetti, PA-C  methocarbamol (ROBAXIN) 500 MG tablet Take 2 tablets (1,000 mg total) by mouth every 6 (six) hours. 10/04/22   Angiulli, Mcarthur Rossetti, PA-C  NARCAN 4 MG/0.1ML LIQD nasal spray kit Place 1 spray into the nose as needed (accidental overdose). 07/14/22   [provider]  temazepam (RESTORIL) 15 MG capsule Take 1 capsule (15 mg total) by mouth at bedtime. 10/04/22   Angiulli, Mcarthur Rossetti, PA-C  Vitamin D, Ergocalciferol, (DRISDOL) 1.25 MG (50000 UNIT) CAPS capsule Take 50,000 Units by mouth once a week. 10/05/22   [provider]  vitamin D3 (CHOLECALCIFEROL) 25 MCG tablet Take 2  tablets (2,000 Units total) by mouth 2 (two) times daily. 10/04/22   Angiulli, Mcarthur Rossetti, PA-C      Allergies    Lactose intolerance (gi)    Review of Systems   Review of Systems  Constitutional:  Negative for fever.  Gastrointestinal:  Negative for abdominal pain and nausea.  All other systems reviewed and are negative.   Physical Exam Updated Vital Signs BP 117/80 (BP Location: Left Arm)   Pulse 70   Temp 98 F (36.7 C) (Oral)   Resp 16   Ht 5\' 11"  (1.803 m)   Wt 68 kg   SpO2 100%   BMI 20.92 kg/m  Physical Exam Vitals and nursing note reviewed.  Constitutional:      General: He is not in acute distress.    Appearance: Normal appearance. He is not ill-appearing.  HENT:     Head: Normocephalic and atraumatic.     Nose: Nose normal.  Eyes:     Conjunctiva/sclera: Conjunctivae normal.  Cardiovascular:     Rate and Rhythm: Normal rate.  Pulmonary:     Effort: Pulmonary effort is normal. No respiratory distress.  Abdominal:     General: There is no distension.     Palpations: Abdomen is soft.     Tenderness: There is no abdominal tenderness. There is no guarding.  Musculoskeletal:        General: No deformity.  Skin:    Findings: No rash.  Neurological:     Mental Status: He is alert.     ED Results / Procedures / Treatments   Labs (all labs ordered are listed, but only abnormal results are displayed) Labs Reviewed - No data to display  EKG None  Radiology No results found.  Procedures Procedures    Medications Ordered in ED Medications - No data to display  ED Course/ Medical Decision Making/ A&P                             Medical Decision Making  Patient presents for new colostomy supplies.  Wound visualized.  No signs of infection noted.  Bag replaced.  He is appropriate for discharge.  Discharged in stable condition.   Final Clinical Impression(s) / ED Diagnoses Final diagnoses:  Ileostomy bag changed [facility])    Rx / DC Orders ED  Discharge Orders     None         Marita Kansas, PA-C 11/14/22 1051    Theresia Lo Varna K, [place] 11/14/22 1558

## 2022-11-14 NOTE — ED Triage Notes (Signed)
Pt arrived by EMS, from home. Having abdominal pain, leakage from ostomy bag. Had ostomy bag changed 8 hours ago. Pain around site. Drainage and pain started 2 days ago.
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
## 2022-11-14 NOTE — Discharge Instructions (Signed)
Your ostomy bag was replaced.  If any concerning symptoms return to the emergency department otherwise follow-up with your primary care provider.

## 2022-11-14 NOTE — ED Notes (Signed)
[facility] called  

## 2022-11-15 ENCOUNTER — Other Ambulatory Visit: Payer: Self-pay

## 2022-11-15 ENCOUNTER — Other Ambulatory Visit (INDEPENDENT_AMBULATORY_CARE_PROVIDER_SITE_OTHER): Payer: Self-pay | Admitting: Primary Care

## 2022-11-15 ENCOUNTER — Encounter: Payer: Self-pay | Admitting: *Deleted

## 2022-11-15 ENCOUNTER — Other Ambulatory Visit (HOSPITAL_COMMUNITY): Payer: Self-pay

## 2022-11-15 ENCOUNTER — Other Ambulatory Visit: Payer: Self-pay | Admitting: *Deleted

## 2022-11-15 MED ORDER — ASCORBIC ACID 500 MG PO TABS
1000.0000 mg | ORAL_TABLET | Freq: Every day | ORAL | 0 refills | Status: AC
Start: 2022-11-15 — End: ?
  Filled 2022-11-15 – 2023-03-08 (×5): qty 200, 100d supply, fill #0

## 2022-11-15 MED ORDER — BUSPIRONE HCL 10 MG PO TABS
15.0000 mg | ORAL_TABLET | Freq: Three times a day (TID) | ORAL | 5 refills | Status: DC
  Filled 2022-11-15 – 2022-12-01 (×4): qty 135, 30d supply, fill #0
  Filled 2023-01-05 – 2023-03-08 (×2): qty 135, 30d supply, fill #1
  Filled 2023-04-05 – 2023-04-29 (×2): qty 135, 30d supply, fill #2

## 2022-11-15 MED ORDER — APIXABAN 5 MG PO TABS
5.0000 mg | ORAL_TABLET | Freq: Two times a day (BID) | ORAL | 1 refills | Status: DC
  Filled 2022-11-15 – 2022-12-01 (×4): qty 60, 30d supply, fill #0
  Filled 2023-01-05 – 2023-03-08 (×2): qty 60, 30d supply, fill #1

## 2022-11-15 MED ORDER — CYPROHEPTADINE HCL 4 MG PO TABS
4.0000 mg | ORAL_TABLET | Freq: Every day | ORAL | 5 refills | Status: DC
  Filled 2022-11-15 – 2022-12-01 (×4): qty 30, 30d supply, fill #0
  Filled 2023-01-05 – 2023-03-08 (×2): qty 30, 30d supply, fill #1
  Filled 2023-04-05 – 2023-04-22 (×2): qty 30, 30d supply, fill #2

## 2022-11-15 MED ORDER — OXYCODONE-ACETAMINOPHEN 5-325 MG PO TABS: 1.0000 | ORAL_TABLET | Freq: Three times a day (TID) | ORAL | 0 refills | Status: DC | PRN

## 2022-11-15 MED ORDER — DULOXETINE HCL 60 MG PO CPEP
120.0000 mg | ORAL_CAPSULE | Freq: Every day | ORAL | 5 refills | Status: DC
  Filled 2022-11-15 – 2022-12-01 (×4): qty 60, 30d supply, fill #0
  Filled 2023-01-05 – 2023-03-08 (×2): qty 60, 30d supply, fill #1
  Filled 2023-04-05 – 2023-04-29 (×2): qty 60, 30d supply, fill #2

## 2022-11-15 MED ORDER — VITAMIN D3 25 MCG PO TABS
2000.0000 [IU] | ORAL_TABLET | Freq: Two times a day (BID) | ORAL | 1 refills | Status: DC
Start: 2022-11-15 — End: 2023-09-12
  Filled 2022-11-15 – 2023-06-06 (×11): qty 60, 15d supply, fill #0
  Filled 2023-07-06: qty 60, 15d supply, fill #1

## 2022-11-15 MED ORDER — FERROUS SULFATE 325 (65 FE) MG PO TABS
325.0000 mg | ORAL_TABLET | Freq: Two times a day (BID) | ORAL | 1 refills | Status: DC
  Filled 2022-11-15 – 2022-12-01 (×3): qty 100, 50d supply, fill #0
  Filled 2023-01-06 – 2023-03-08 (×2): qty 100, 50d supply, fill #1

## 2022-11-15 MED ORDER — FLUDROCORTISONE ACETATE 0.1 MG PO TABS
0.1000 mg | ORAL_TABLET | Freq: Every day | ORAL | 5 refills | Status: DC
Start: 2022-11-15 — End: 2023-04-29
  Filled 2022-11-15 – 2022-12-01 (×4): qty 30, 30d supply, fill #0
  Filled 2023-01-05 – 2023-03-08 (×2): qty 30, 30d supply, fill #1
  Filled 2023-04-05: qty 30, 30d supply, fill #2
  Filled 2023-04-06: qty 1, 1d supply, fill #2
  Filled 2023-04-07 – 2023-04-22 (×2): qty 29, 29d supply, fill #2
  Filled 2023-04-22: qty 1, 1d supply, fill #2

## 2022-11-15 MED ORDER — METHOCARBAMOL 500 MG PO TABS
1000.0000 mg | ORAL_TABLET | Freq: Four times a day (QID) | ORAL | 5 refills | Status: DC
Start: 2022-11-15 — End: 2023-07-27
  Filled 2022-11-15 – 2023-03-08 (×6): qty 180, 23d supply, fill #0
  Filled 2023-03-28 – 2023-04-29 (×3): qty 180, 23d supply, fill #1
  Filled 2023-06-11: qty 180, 23d supply, fill #2
  Filled 2023-07-06: qty 180, 23d supply, fill #3

## 2022-11-15 MED ORDER — MAGNESIUM OXIDE -MG SUPPLEMENT 400 (240 MG) MG PO TABS
800.0000 mg | ORAL_TABLET | Freq: Two times a day (BID) | ORAL | 1 refills | Status: DC
  Filled 2022-11-15 – 2022-12-01 (×4): qty 120, 30d supply, fill #0
  Filled 2023-01-05 – 2023-03-08 (×2): qty 120, 30d supply, fill #1

## 2022-11-15 MED ORDER — CYANOCOBALAMIN 1000 MCG PO TABS
1000.0000 ug | ORAL_TABLET | Freq: Every day | ORAL | 0 refills | Status: AC
Start: 2022-11-15 — End: ?
  Filled 2022-11-15 – 2023-01-05 (×4): qty 30, 30d supply, fill #0
  Filled 2023-03-08: qty 100, 100d supply, fill #0

## 2022-11-15 MED ORDER — MELATONIN 3 MG PO TABS
3.0000 mg | ORAL_TABLET | Freq: Every day | ORAL | 0 refills | Status: DC
  Filled 2022-11-15 – 2022-12-01 (×3): qty 60, 60d supply, fill #0

## 2022-11-15 MED ORDER — VITAMIN D (ERGOCALCIFEROL) 1.25 MG (50000 UNIT) PO CAPS
50000.0000 [IU] | ORAL_CAPSULE | ORAL | 0 refills | Status: DC
Start: 2022-11-15 — End: 2023-07-27
  Filled 2022-11-15 – 2022-11-16 (×2): qty 5, 35d supply, fill #0
  Filled 2022-11-16: qty 5, 34d supply, fill #0
  Filled 2022-12-01 – 2023-06-04 (×11): qty 5, 35d supply, fill #0

## 2022-11-15 MED ORDER — DICLOFENAC SODIUM 1 % EX GEL
2.0000 g | Freq: Four times a day (QID) | CUTANEOUS | 0 refills | Status: DC
  Filled 2022-11-15 – 2022-12-01 (×5): qty 200, 30d supply, fill #0

## 2022-11-15 MED ORDER — BACLOFEN 5 MG PO TABS
5.0000 mg | ORAL_TABLET | Freq: Three times a day (TID) | ORAL | 5 refills | Status: DC
  Filled 2022-11-15 – 2022-12-01 (×4): qty 90, 30d supply, fill #0
  Filled 2023-01-05: qty 90, 30d supply, fill #1

## 2022-11-15 MED ORDER — DOXAZOSIN MESYLATE 2 MG PO TABS
2.0000 mg | ORAL_TABLET | Freq: Two times a day (BID) | ORAL | 5 refills | Status: DC
  Filled 2022-11-15 – 2022-12-01 (×4): qty 60, 30d supply, fill #0
  Filled 2023-01-05 – 2023-03-08 (×2): qty 60, 30d supply, fill #1
  Filled 2023-04-05 – 2023-04-29 (×2): qty 60, 30d supply, fill #2

## 2022-11-15 MED ORDER — FAMOTIDINE 20 MG PO TABS
20.0000 mg | ORAL_TABLET | Freq: Two times a day (BID) | ORAL | 1 refills | Status: DC
  Filled 2022-11-15 – 2022-12-01 (×4): qty 60, 30d supply, fill #0
  Filled 2023-01-05 – 2023-03-08 (×2): qty 60, 30d supply, fill #1

## 2022-11-15 MED ORDER — GABAPENTIN 400 MG PO CAPS
1200.0000 mg | ORAL_CAPSULE | Freq: Three times a day (TID) | ORAL | 5 refills | Status: DC
  Filled 2022-11-15 – 2022-12-01 (×4): qty 180, 20d supply, fill #0
  Filled 2023-01-05 – 2023-02-21 (×2): qty 180, 20d supply, fill #1
  Filled 2023-03-08: qty 180, 20d supply, fill #2
  Filled 2023-03-28 – 2023-04-07 (×2): qty 180, 20d supply, fill #3
  Filled 2023-04-22: qty 180, 20d supply, fill #4

## 2022-11-15 NOTE — Telephone Encounter (Signed)
Patient hospital f/u 11/26/22.

## 2022-11-16 ENCOUNTER — Other Ambulatory Visit (HOSPITAL_COMMUNITY): Payer: Self-pay

## 2022-11-16 ENCOUNTER — Other Ambulatory Visit: Payer: Self-pay

## 2022-11-16 ENCOUNTER — Telehealth: Payer: Self-pay | Admitting: *Deleted

## 2022-11-16 ENCOUNTER — Encounter: Payer: Self-pay | Admitting: Physical Therapy

## 2022-11-16 NOTE — Therapy (Signed)
[facility] [address] [place], [place], [address] Phone: [phone]   Fax:  [phone]  Patient Details  Name: Shane Klein MRN: [id number] Date of Birth: March 21, 1991 Referring Provider:  No ref. provider found  Encounter Date: 11/16/2022  PHYSICAL THERAPY DISCHARGE SUMMARY  Visits from Start of Care: 1  Current functional level related to goals / functional outcomes: N/A   Remaining deficits: N/A   Education / Equipment: N/A    Patient agrees to discharge. Patient goals were not met. Patient is being discharged due to a change in medical status.  Pt is being DC from PT due to recent hospital admission and having 3 no-shows and cancellations. Pt made aware of no-show/cancellation policy on evaluation. Attempted to call pt and pt's mother, but unable to reach either and both have full VM boxes, so unable to leave message. Pt will need new referral to return due to change in medical status.    Jill Alexanders Parsa Rickett, PT 11/16/2022, 3:45 PM  [facility] [address] [place], [place], [address] Phone: [phone]   Fax:  [phone]

## 2022-11-16 NOTE — Telephone Encounter (Signed)
Pharmacy called reporting that Shane Klein had an Rx for Oxycodone 10 mg 7 day supply which they filled and it was the same day you sent your Rx for 5 mg in. He is trying to get your Rx filled as well. The pharmacist says the 7 day was from someone at [place]. It is not in PMP yet. We have told them to hold your Rx until we speak with you. Were you aware that he had this other Rx for oxycodone?

## 2022-11-17 ENCOUNTER — Encounter: Payer: Self-pay | Admitting: *Deleted

## 2022-11-17 ENCOUNTER — Ambulatory Visit: Payer: Medicaid Other | Admitting: Occupational Therapy

## 2022-11-17 ENCOUNTER — Ambulatory Visit: Payer: Medicaid Other | Admitting: Physical Therapy

## 2022-11-17 NOTE — Telephone Encounter (Signed)
At Dr Dahlia Client request I have cancelled the Rx for oxycodone acetaminophen 5/325. I attempted to reach Mr Gunnels but no answer and no voicemail so a MyChart message was sent to him informing him of this decision.

## 2022-11-24 ENCOUNTER — Encounter: Payer: Medicaid Other | Admitting: Occupational Therapy

## 2022-11-24 ENCOUNTER — Ambulatory Visit: Payer: Medicaid Other | Admitting: Physical Therapy

## 2022-11-24 ENCOUNTER — Other Ambulatory Visit (HOSPITAL_COMMUNITY): Payer: Self-pay

## 2022-11-25 ENCOUNTER — Other Ambulatory Visit (HOSPITAL_COMMUNITY): Payer: Self-pay

## 2022-11-26 ENCOUNTER — Other Ambulatory Visit (HOSPITAL_COMMUNITY): Payer: Self-pay

## 2022-11-26 ENCOUNTER — Encounter: Payer: Medicaid Other | Admitting: Physical Medicine and Rehabilitation

## 2022-11-29 ENCOUNTER — Ambulatory Visit (HOSPITAL_COMMUNITY): Payer: Medicaid Other | Admitting: Nurse Practitioner

## 2022-11-29 ENCOUNTER — Other Ambulatory Visit (HOSPITAL_COMMUNITY): Payer: Self-pay

## 2022-11-29 ENCOUNTER — Encounter: Payer: Medicaid Other | Admitting: Physical Medicine and Rehabilitation

## 2022-12-01 ENCOUNTER — Ambulatory Visit: Payer: Medicaid Other | Admitting: Physical Therapy

## 2022-12-01 ENCOUNTER — Other Ambulatory Visit (HOSPITAL_COMMUNITY): Payer: Self-pay

## 2022-12-01 ENCOUNTER — Encounter: Payer: Medicaid Other | Admitting: Occupational Therapy

## 2022-12-02 ENCOUNTER — Emergency Department (HOSPITAL_COMMUNITY)
Admission: EM | Admit: 2022-12-02 | Discharge: 2022-12-02 | Disposition: A | Discharge status: Home / Self Care | Payer: Medicaid Other | Attending: Emergency Medicine | Admitting: Emergency Medicine

## 2022-12-02 ENCOUNTER — Other Ambulatory Visit: Payer: Self-pay

## 2022-12-02 ENCOUNTER — Encounter (HOSPITAL_COMMUNITY): Payer: Self-pay

## 2022-12-02 DIAGNOSIS — Z7901 Long term (current) use of anticoagulants: Secondary | ICD-10-CM | POA: Diagnosis not present

## 2022-12-02 DIAGNOSIS — Z432 Encounter for attention to ileostomy: Secondary | ICD-10-CM

## 2022-12-02 DIAGNOSIS — K9403 Colostomy malfunction: Secondary | ICD-10-CM | POA: Diagnosis present

## 2022-12-02 DIAGNOSIS — Z7982 Long term (current) use of aspirin: Secondary | ICD-10-CM | POA: Diagnosis not present

## 2022-12-02 DIAGNOSIS — Z932 Ileostomy status: Secondary | ICD-10-CM | POA: Insufficient documentation

## 2022-12-02 NOTE — Discharge Instructions (Addendum)
I recommend close follow-up with GI for reevaluation.  Please do not hesitate to return to emergency department if worrisome signs symptoms we discussed become apparent.
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
## 2022-12-02 NOTE — ED Triage Notes (Signed)
Pt BIB [facility] d/t not having Colostomy supplies at home.

## 2022-12-03 ENCOUNTER — Other Ambulatory Visit: Payer: Self-pay

## 2022-12-03 ENCOUNTER — Encounter (HOSPITAL_COMMUNITY): Payer: Self-pay

## 2022-12-03 ENCOUNTER — Emergency Department (HOSPITAL_COMMUNITY)
Admission: EM | Admit: 2022-12-03 | Discharge: 2022-12-03 | Disposition: A | Discharge status: Home / Self Care | Payer: Medicaid Other | Attending: Emergency Medicine | Admitting: Emergency Medicine

## 2022-12-03 ENCOUNTER — Ambulatory Visit (HOSPITAL_COMMUNITY): Payer: Medicaid Other | Admitting: Nurse Practitioner

## 2022-12-03 DIAGNOSIS — Z7901 Long term (current) use of anticoagulants: Secondary | ICD-10-CM | POA: Diagnosis not present

## 2022-12-03 DIAGNOSIS — Z7982 Long term (current) use of aspirin: Secondary | ICD-10-CM | POA: Insufficient documentation

## 2022-12-03 DIAGNOSIS — Z432 Encounter for attention to ileostomy: Secondary | ICD-10-CM | POA: Diagnosis present

## 2022-12-03 MED ORDER — LOPERAMIDE HCL 2 MG PO CAPS
4.0000 mg | ORAL_CAPSULE | Freq: Once | ORAL | Status: AC
  Administered 2022-12-03: 4 mg via ORAL
  Filled 2022-12-03: qty 2

## 2022-12-03 NOTE — Discharge Instructions (Signed)
Thank you for allowing [place] to be a part of your care today.  Your ileostomy bag was changed and you were sent home with a couple of extras.  Please keep your scheduled appointment with your GI doctor.  I also recommend contacting your supplier to see if you are able to get more supplies earlier than the 18th.  Imodium is available over the counter, please take as directed.   Return to the ED if you develop sudden worsening of your symptoms or if you have any new concerns.

## 2022-12-03 NOTE — ED Triage Notes (Signed)
Pt BIB EMS for no ostomy supplies. Stoma is red and at baseline.

## 2022-12-03 NOTE — ED Provider Notes (Signed)
[place] EMERGENCY DEPARTMENT AT [facility] Provider Note   CSN: [id number] Arrival date & time: 12/03/22  0346     History  Chief Complaint  Patient presents with   ostomy supplies    Shane Klein is a 32 y.o. male presents to the ED for colostomy bag malfunction.  Patient states that it is leaking and he does not have any more colostomy bags at home.  He denies pain, fever, or purulent drainage.  He is requesting to have it changed and for supplies to take home.  Patient states he has upcoming GI appointment.  Patient was seen yesterday for same issue.        Home Medications Prior to Admission medications   Medication Sig Start Date End Date Taking? Authorizing Provider  acetaminophen (TYLENOL) 325 MG tablet Take 1 tablet (325 mg total) by mouth every 6 (six) hours as needed for mild pain. 10/04/22   Angiulli, Mcarthur Rossetti, PA-C  apixaban (ELIQUIS) 5 MG TABS tablet Take 1 tablet (5 mg total) by mouth 2 (two) times daily. 11/15/22   Lovorn, Aundra Millet, MD  ascorbic acid (VITAMIN C) 500 MG tablet Take 2 tablets (1,000 mg total) by mouth daily. 11/15/22   Lovorn, Aundra Millet, MD  aspirin EC 81 MG tablet Take 1 tablet (81 mg total) by mouth daily. Swallow whole. 10/04/22   Angiulli, Mcarthur Rossetti, PA-C  Baclofen 5 MG TABS Take 1 tablet (5 mg total) by mouth 3 (three) times daily. 11/15/22   Lovorn, Aundra Millet, MD  busPIRone (BUSPAR) 10 MG tablet Take 1.5 tablets (15 mg total) by mouth 3 (three) times daily. 11/15/22   Lovorn, Aundra Millet, MD  Calcium Polycarbophil (FIBER) 625 MG TABS Take 2 tablets (1,250 mg total) by mouth 3 (three) times daily. 10/04/22   Angiulli, Mcarthur Rossetti, PA-C  cyanocobalamin 1000 MCG tablet Take 1 tablet (1,000 mcg total) by mouth daily. Get from PCP- I will not refill again 11/15/22   Lovorn, Aundra Millet, MD  cyproheptadine (PERIACTIN) 4 MG tablet Take 1 tablet (4 mg total) by mouth at bedtime. 11/15/22   Lovorn, Aundra Millet, MD  diclofenac Sodium (VOLTAREN) 1 % GEL Apply 2 g topically 4 (four)  times daily. Will not refill again- is an over the counter medicine- need to get that way 11/15/22   Lovorn, Aundra Millet, MD  doxazosin (CARDURA) 2 MG tablet Take 1 tablet (2 mg total) by mouth every 12 (twelve) hours. 11/15/22   Lovorn, Aundra Millet, MD  DULoxetine (CYMBALTA) 60 MG capsule Take 2 capsules (120 mg total) by mouth at bedtime. 11/15/22   Lovorn, Aundra Millet, MD  famotidine (PEPCID) 20 MG tablet Take 1 tablet (20 mg total) by mouth 2 (two) times daily. Needs to get from PCP in future- I will not refill again 11/15/22   Lovorn, Aundra Millet, MD  ferrous sulfate 325 (65 FE) MG tablet Take 1 tablet (325 mg total) by mouth 2 (two) times daily with a meal. Will give 1 more refill- get from PCP in future- I will not refill 11/15/22   Lovorn, Aundra Millet, MD  fludrocortisone (FLORINEF) 0.1 MG tablet Take 1 tablet (0.1 mg total) by mouth daily. 11/15/22   Lovorn, Aundra Millet, MD  gabapentin (NEURONTIN) 400 MG capsule Take 3 capsules (1,200 mg total) by mouth 3 (three) times daily. 11/15/22   Lovorn, Aundra Millet, MD  hydrOXYzine (ATARAX) 25 MG tablet  11/22/22   [provider]  lidocaine (HM LIDOCAINE PATCH) 4 % Place 1 patch onto the skin daily. 11/14/22   Joycelyn Man  M, PA-C  loperamide (IMODIUM A-D) 2 MG tablet Take 2 tablets (4 mg total) by mouth 4 (four) times daily as needed for diarrhea or loose stools. 11/09/22 12/09/22  Mayo, Eugenio Hoes, FNP  loperamide (IMODIUM) 2 MG capsule Take 1 capsule (2 mg total) by mouth 3 (three) times daily. 11/02/22   Barnetta Chapel, PA-C  magnesium oxide (MAG-OX) 400 (240 Mg) MG tablet Take 2 tablets (800 mg total) by mouth 2 (two) times daily. Need to get from PCP in future- will not refill again 11/15/22   Lovorn, Aundra Millet, MD  melatonin 3 MG TABS tablet Take 1 tablet (3 mg total) by mouth at bedtime. 11/15/22   Lovorn, Aundra Millet, MD  methocarbamol (ROBAXIN) 500 MG tablet Take 2 tablets (1,000 mg total) by mouth every 6 (six) hours. 11/15/22   Lovorn, Aundra Millet, MD  methylPREDNISolone (MEDROL DOSEPAK) 4 MG TBPK  tablet 6 day taper; take as directed on package 11/14/22   [name], PA-C  NARCAN 4 MG/0.1ML LIQD nasal spray kit Place 1 spray into the nose as needed (accidental overdose). 07/14/22   [provider]  Ostomy Supplies MISC Pt needs the following [phone] pouch,pouch number of [phone], # 3344 barrier, [address], [address] Ceex Haci, powder [phone] 10/29/22   [provider]  Oxycodone HCl 10 MG TABS  11/22/22   [provider]  oxyCODONE-acetaminophen (PERCOCET) 5-325 MG tablet Take 1 tablet by mouth every 8 (eight) hours as needed for severe pain. 11/15/22   Lovorn, Aundra Millet, MD  predniSONE (DELTASONE) 20 MG tablet Take 20 mg by mouth 2 (two) times daily. 06/29/22   [provider]  sertraline (ZOLOFT) 25 MG tablet  11/22/22   [provider]  temazepam (RESTORIL) 15 MG capsule Take 1 capsule (15 mg total) by mouth at bedtime. 10/04/22   Angiulli, Mcarthur Rossetti, PA-C  Vitamin D, Ergocalciferol, (DRISDOL) 1.25 MG (50000 UNIT) CAPS capsule Take 1 capsule (50,000 Units total) by mouth once a week. Doesn't need more- can take over the counter Vitd  500 units/day 11/15/22   Lovorn, Aundra Millet, MD  vitamin D3 (CHOLECALCIFEROL) 25 MCG tablet Take 2 tablets (2,000 Units total) by mouth 2 (two) times daily. Get from PCP in future- will not refill again 11/15/22   Genice Rouge, MD      Allergies    Lactose intolerance (gi)    Review of Systems   Review of Systems  Physical Exam Updated Vital Signs BP 130/89   Pulse 78   Temp 98.3 F (36.8 C) (Oral)   Resp 18   Ht 5\' 11"  (1.803 m)   Wt 68 kg   SpO2 100%   BMI 20.91 kg/m  Physical Exam Vitals and nursing note reviewed.  Constitutional:      General: He is not in acute distress.    Appearance: Normal appearance. He is not ill-appearing or diaphoretic.  Cardiovascular:     Rate and Rhythm: Normal rate and regular rhythm.  Pulmonary:     Effort: Pulmonary effort is normal.  Abdominal:     General:  Abdomen is flat.     Palpations: Abdomen is soft.     Tenderness: There is no abdominal tenderness.     Comments: Stoma is present with good color, no bleeding or purulent drainage.  Stool passing through stoma without difficulty.    Skin:    General: Skin is warm and dry.     Capillary Refill: Capillary refill takes less than 2 seconds.  Neurological:  Mental Status: He is alert. Mental status is at baseline.  Psychiatric:        Mood and Affect: Mood normal.        Behavior: Behavior normal.     ED Results / Procedures / Treatments   Labs (all labs ordered are listed, but only abnormal results are displayed) Labs Reviewed - No data to display  EKG None  Radiology No results found.  Procedures Procedures    Medications Ordered in ED Medications  loperamide (IMODIUM) capsule 4 mg (4 mg Oral Given 12/03/22 0431)    ED Course/ Medical Decision Making/ A&P                                 Medical Decision Making  This patient presents to the ED with chief complaint(s) of leaking colostomy and is requesting a new bag.   Initial Assessment:   Exam significant for no purulent drainage or bleeding around the ostomy.  Patient is afebrile, well-appearing, and is not in acute distress.  Stoma has good color and is at baseline, per patient.  Liquid brown stool in ostomy bag.  Treatment and Reassessment: Colostomy bag was successfully replaced by nursing staff.  Patient was cleaned up and provided with supplies.  Patient requesting Imodium.  He was given 4 mg.    Disposition:   Advised patient to follow up with GI provider as scheduled and to obtain more ostomy supplies in the outpatient setting.  Patient is appropriate for discharge home.  Strict return precautions were discussed and patient verbalized his understanding.            Final Clinical Impression(s) / ED Diagnoses Final diagnoses:  Ileostomy bag changed [facility])    Rx / DC Orders ED Discharge Orders      None         Lenard Simmer, PA-C 12/03/22 0442    Tilden Fossa, MD 12/04/22 [phone]

## 2022-12-05 ENCOUNTER — Other Ambulatory Visit: Payer: Self-pay

## 2022-12-06 ENCOUNTER — Encounter: Payer: Self-pay | Admitting: Physical Medicine and Rehabilitation

## 2022-12-08 ENCOUNTER — Encounter: Payer: Medicaid Other | Admitting: Occupational Therapy

## 2022-12-08 ENCOUNTER — Ambulatory Visit: Payer: Medicaid Other | Admitting: Physical Therapy

## 2022-12-10 ENCOUNTER — Ambulatory Visit: Payer: Medicaid Other

## 2022-12-15 ENCOUNTER — Telehealth (HOSPITAL_COMMUNITY): Payer: Self-pay | Admitting: Student

## 2022-12-15 ENCOUNTER — Ambulatory Visit: Payer: Medicaid Other | Admitting: Physical Therapy

## 2022-12-15 ENCOUNTER — Encounter (HOSPITAL_COMMUNITY): Payer: Self-pay

## 2022-12-15 ENCOUNTER — Encounter: Payer: Medicaid Other | Admitting: Occupational Therapy

## 2022-12-15 NOTE — Telephone Encounter (Signed)
Called patient to confirm appointment for 8/21.... no ans. Unable to LVM as VM has not been set up. Sent patient message via MyChart. Thanks!

## 2022-12-16 ENCOUNTER — Ambulatory Visit (HOSPITAL_COMMUNITY): Payer: Medicaid Other | Admitting: Student

## 2022-12-22 ENCOUNTER — Ambulatory Visit: Payer: Medicaid Other | Admitting: Physical Therapy

## 2022-12-22 ENCOUNTER — Encounter: Payer: Medicaid Other | Admitting: Occupational Therapy

## 2022-12-24 ENCOUNTER — Telehealth (HOSPITAL_COMMUNITY): Payer: Self-pay | Admitting: Student

## 2022-12-24 ENCOUNTER — Encounter (HOSPITAL_COMMUNITY): Payer: Self-pay

## 2022-12-24 NOTE — Telephone Encounter (Signed)
Called PT to remind of appt on 9/4 at 10am. Voicemail was not set up. Left mychart message

## 2022-12-28 NOTE — Telephone Encounter (Signed)
PT confirmed appt for 12/29/22 at 10am

## 2022-12-29 ENCOUNTER — Encounter: Payer: Medicaid Other | Admitting: Occupational Therapy

## 2022-12-29 ENCOUNTER — Ambulatory Visit (HOSPITAL_COMMUNITY): Payer: Medicaid Other | Admitting: Student

## 2023-01-05 ENCOUNTER — Other Ambulatory Visit (HOSPITAL_COMMUNITY): Payer: Self-pay

## 2023-01-05 ENCOUNTER — Encounter: Payer: Medicaid Other | Admitting: Occupational Therapy

## 2023-01-06 ENCOUNTER — Other Ambulatory Visit (INDEPENDENT_AMBULATORY_CARE_PROVIDER_SITE_OTHER): Payer: Self-pay | Admitting: Physician Assistant

## 2023-01-06 ENCOUNTER — Other Ambulatory Visit: Payer: Self-pay

## 2023-01-06 ENCOUNTER — Other Ambulatory Visit (HOSPITAL_COMMUNITY): Payer: Self-pay

## 2023-01-06 DIAGNOSIS — G8929 Other chronic pain: Secondary | ICD-10-CM

## 2023-01-07 ENCOUNTER — Other Ambulatory Visit (HOSPITAL_COMMUNITY): Payer: Self-pay

## 2023-01-10 ENCOUNTER — Ambulatory Visit: Payer: Medicaid Other | Attending: Physical Medicine and Rehabilitation | Admitting: Occupational Therapy

## 2023-01-10 ENCOUNTER — Other Ambulatory Visit: Payer: Self-pay

## 2023-01-12 ENCOUNTER — Encounter: Payer: Medicaid Other | Admitting: Occupational Therapy

## 2023-01-18 ENCOUNTER — Other Ambulatory Visit (HOSPITAL_COMMUNITY): Payer: Self-pay

## 2023-01-19 ENCOUNTER — Encounter: Payer: Medicaid Other | Admitting: Occupational Therapy

## 2023-01-19 ENCOUNTER — Other Ambulatory Visit (HOSPITAL_COMMUNITY): Payer: Self-pay

## 2023-01-20 ENCOUNTER — Other Ambulatory Visit (HOSPITAL_COMMUNITY): Payer: Self-pay

## 2023-01-24 ENCOUNTER — Encounter
Payer: Medicaid Other | Attending: Physical Medicine and Rehabilitation | Admitting: Physical Medicine and Rehabilitation

## 2023-01-24 ENCOUNTER — Encounter: Payer: Self-pay | Admitting: Physical Medicine and Rehabilitation

## 2023-01-24 VITALS — BP 106/65 | HR 61 | Ht 71.0 in | Wt 162.0 lb

## 2023-01-24 DIAGNOSIS — G5631 Lesion of radial nerve, right upper limb: Secondary | ICD-10-CM

## 2023-01-24 DIAGNOSIS — Z932 Ileostomy status: Secondary | ICD-10-CM | POA: Diagnosis not present

## 2023-01-24 DIAGNOSIS — R252 Cramp and spasm: Secondary | ICD-10-CM | POA: Insufficient documentation

## 2023-01-24 DIAGNOSIS — W3400XA Accidental discharge from unspecified firearms or gun, initial encounter: Secondary | ICD-10-CM | POA: Diagnosis not present

## 2023-01-24 DIAGNOSIS — W3400XD Accidental discharge from unspecified firearms or gun, subsequent encounter: Secondary | ICD-10-CM | POA: Insufficient documentation

## 2023-01-24 DIAGNOSIS — S24103D Unspecified injury at T7-T10 level of thoracic spinal cord, subsequent encounter: Secondary | ICD-10-CM | POA: Diagnosis not present

## 2023-01-24 DIAGNOSIS — S24103A Unspecified injury at T7-T10 level of thoracic spinal cord, initial encounter: Secondary | ICD-10-CM

## 2023-01-24 MED ORDER — BACLOFEN 20 MG PO TABS: 30.0000 mg | ORAL_TABLET | Freq: Three times a day (TID) | ORAL | 5 refills | Status: DC
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
## 2023-01-26 ENCOUNTER — Encounter: Payer: Medicaid Other | Admitting: Occupational Therapy

## 2023-02-02 ENCOUNTER — Encounter: Payer: Medicaid Other | Admitting: Occupational Therapy

## 2023-02-11 ENCOUNTER — Inpatient Hospital Stay: Payer: Medicaid Other | Admitting: Physical Medicine and Rehabilitation

## 2023-02-17 ENCOUNTER — Encounter: Payer: Self-pay | Admitting: Physical Medicine and Rehabilitation

## 2023-02-17 DIAGNOSIS — G8222 Paraplegia, incomplete: Secondary | ICD-10-CM

## 2023-02-21 ENCOUNTER — Other Ambulatory Visit (HOSPITAL_COMMUNITY): Payer: Self-pay

## 2023-03-02 DIAGNOSIS — Z932 Ileostomy status: Secondary | ICD-10-CM | POA: Insufficient documentation

## 2023-03-03 ENCOUNTER — Encounter: Payer: Self-pay | Admitting: Physical Medicine and Rehabilitation

## 2023-03-08 ENCOUNTER — Other Ambulatory Visit: Payer: Self-pay

## 2023-03-08 ENCOUNTER — Encounter: Payer: Self-pay | Admitting: Physical Medicine and Rehabilitation

## 2023-03-08 ENCOUNTER — Other Ambulatory Visit (HOSPITAL_COMMUNITY): Payer: Self-pay

## 2023-03-16 ENCOUNTER — Ambulatory Visit: Payer: Medicaid Other | Attending: Physical Medicine and Rehabilitation | Admitting: Physical Therapy

## 2023-03-21 ENCOUNTER — Ambulatory Visit (INDEPENDENT_AMBULATORY_CARE_PROVIDER_SITE_OTHER): Payer: Self-pay | Admitting: *Deleted
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
## 2023-03-22 ENCOUNTER — Telehealth (INDEPENDENT_AMBULATORY_CARE_PROVIDER_SITE_OTHER): Payer: Self-pay | Admitting: Primary Care

## 2023-03-22 NOTE — Telephone Encounter (Signed)
Pt confirmed he will be at apt.

## 2023-03-23 ENCOUNTER — Ambulatory Visit (INDEPENDENT_AMBULATORY_CARE_PROVIDER_SITE_OTHER): Payer: Medicaid Other | Admitting: Primary Care

## 2023-03-28 ENCOUNTER — Other Ambulatory Visit: Payer: Self-pay

## 2023-03-28 ENCOUNTER — Other Ambulatory Visit (HOSPITAL_COMMUNITY): Payer: Self-pay

## 2023-03-31 ENCOUNTER — Ambulatory Visit: Payer: Medicaid Other | Attending: Physical Medicine and Rehabilitation | Admitting: Physical Therapy

## 2023-03-31 ENCOUNTER — Other Ambulatory Visit (HOSPITAL_COMMUNITY): Payer: Self-pay

## 2023-03-31 ENCOUNTER — Telehealth: Payer: Self-pay | Admitting: Physical Therapy

## 2023-03-31 DIAGNOSIS — G5631 Lesion of radial nerve, right upper limb: Secondary | ICD-10-CM | POA: Insufficient documentation

## 2023-03-31 DIAGNOSIS — M6281 Muscle weakness (generalized): Secondary | ICD-10-CM | POA: Insufficient documentation

## 2023-03-31 DIAGNOSIS — G5632 Lesion of radial nerve, left upper limb: Secondary | ICD-10-CM

## 2023-03-31 DIAGNOSIS — G8222 Paraplegia, incomplete: Secondary | ICD-10-CM | POA: Insufficient documentation

## 2023-03-31 DIAGNOSIS — R2681 Unsteadiness on feet: Secondary | ICD-10-CM | POA: Insufficient documentation

## 2023-03-31 DIAGNOSIS — R2689 Other abnormalities of gait and mobility: Secondary | ICD-10-CM | POA: Diagnosis present
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
## 2023-03-31 NOTE — Addendum Note (Signed)
Addended by: Peter Congo on: 03/31/2023 02:29 PM   Modules accepted: Orders

## 2023-03-31 NOTE — Telephone Encounter (Signed)
Dr. Berline Chough,  Rondall Allegra was evaluated by Physical Therapy on 03/31/2023.  The patient would benefit from an Occupational Therapy evaluation for his R radial nerve palsy.   If you agree, please place an order in [facility] in [facility] or fax the order to [phone]. Thank you, Peter Congo, PT, DPT, [facility] [address] [place], [place]  [address] Phone:  [phone] Fax:  [phone]

## 2023-04-04 DIAGNOSIS — K592 Neurogenic bowel, not elsewhere classified: Secondary | ICD-10-CM | POA: Diagnosis present

## 2023-04-04 DIAGNOSIS — Z87828 Personal history of other (healed) physical injury and trauma: Secondary | ICD-10-CM

## 2023-04-04 DIAGNOSIS — N319 Neuromuscular dysfunction of bladder, unspecified: Secondary | ICD-10-CM | POA: Diagnosis present

## 2023-04-05 ENCOUNTER — Other Ambulatory Visit: Payer: Self-pay | Admitting: Physical Medicine and Rehabilitation

## 2023-04-05 ENCOUNTER — Other Ambulatory Visit (HOSPITAL_COMMUNITY): Payer: Self-pay

## 2023-04-06 ENCOUNTER — Other Ambulatory Visit (HOSPITAL_COMMUNITY): Payer: Self-pay

## 2023-04-06 ENCOUNTER — Ambulatory Visit: Payer: Medicaid Other | Admitting: Physical Therapy

## 2023-04-06 ENCOUNTER — Other Ambulatory Visit: Payer: Self-pay

## 2023-04-07 ENCOUNTER — Other Ambulatory Visit (HOSPITAL_COMMUNITY): Payer: Self-pay

## 2023-04-08 ENCOUNTER — Other Ambulatory Visit (HOSPITAL_COMMUNITY): Payer: Self-pay

## 2023-04-08 MED ORDER — FAMOTIDINE 20 MG PO TABS
20.0000 mg | ORAL_TABLET | Freq: Two times a day (BID) | ORAL | 0 refills | Status: DC
  Filled 2023-04-08 – 2023-06-04 (×3): qty 60, 30d supply, fill #0

## 2023-04-08 MED ORDER — APIXABAN 5 MG PO TABS
5.0000 mg | ORAL_TABLET | Freq: Two times a day (BID) | ORAL | 0 refills | Status: DC
  Filled 2023-04-08 – 2023-04-29 (×2): qty 14, 7d supply, fill #0

## 2023-04-08 MED ORDER — DICLOFENAC SODIUM 1 % EX GEL
2.0000 g | Freq: Four times a day (QID) | CUTANEOUS | 5 refills | Status: DC
Start: 2023-04-08 — End: 2023-07-27
  Filled 2023-04-08: qty 200, 25d supply, fill #0
  Filled 2023-04-09: qty 300, 15d supply, fill #0
  Filled 2023-04-22: qty 200, 25d supply, fill #0
  Filled 2023-06-04: qty 300, 45d supply, fill #0

## 2023-04-11 ENCOUNTER — Other Ambulatory Visit (HOSPITAL_COMMUNITY): Payer: Self-pay

## 2023-04-13 ENCOUNTER — Telehealth: Payer: Self-pay | Admitting: Physical Therapy

## 2023-04-13 ENCOUNTER — Ambulatory Visit: Payer: Medicaid Other | Admitting: Physical Therapy

## 2023-04-17 ENCOUNTER — Other Ambulatory Visit (HOSPITAL_COMMUNITY): Payer: Self-pay

## 2023-04-18 ENCOUNTER — Other Ambulatory Visit (HOSPITAL_COMMUNITY): Payer: Self-pay

## 2023-04-18 ENCOUNTER — Other Ambulatory Visit: Payer: Self-pay

## 2023-04-18 MED ORDER — FIBER 625 MG PO TABS: 1250.0000 mg | ORAL_TABLET | Freq: Three times a day (TID) | ORAL | 5 refills | Status: AC

## 2023-04-21 ENCOUNTER — Other Ambulatory Visit (HOSPITAL_COMMUNITY): Payer: Self-pay

## 2023-04-22 ENCOUNTER — Encounter: Payer: Self-pay | Admitting: Physical Therapy

## 2023-04-22 ENCOUNTER — Ambulatory Visit: Payer: Medicaid Other | Admitting: Occupational Therapy

## 2023-04-22 ENCOUNTER — Other Ambulatory Visit: Payer: Self-pay | Admitting: Physical Medicine and Rehabilitation

## 2023-04-22 ENCOUNTER — Other Ambulatory Visit (HOSPITAL_COMMUNITY): Payer: Self-pay

## 2023-04-22 ENCOUNTER — Other Ambulatory Visit: Payer: Self-pay

## 2023-04-22 ENCOUNTER — Ambulatory Visit: Payer: Medicaid Other | Admitting: Physical Therapy

## 2023-04-22 DIAGNOSIS — M6281 Muscle weakness (generalized): Secondary | ICD-10-CM

## 2023-04-22 DIAGNOSIS — R2689 Other abnormalities of gait and mobility: Secondary | ICD-10-CM

## 2023-04-22 DIAGNOSIS — R2681 Unsteadiness on feet: Secondary | ICD-10-CM

## 2023-04-22 DIAGNOSIS — G8222 Paraplegia, incomplete: Secondary | ICD-10-CM

## 2023-04-22 MED ORDER — MAGNESIUM OXIDE -MG SUPPLEMENT 400 (240 MG) MG PO TABS
800.0000 mg | ORAL_TABLET | Freq: Two times a day (BID) | ORAL | 1 refills | Status: DC
  Filled 2023-04-22: qty 120, 30d supply, fill #0
  Filled 2023-06-04: qty 120, 30d supply, fill #1
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
## 2023-04-22 NOTE — Therapy (Signed)
[facility] [address] [place], [place], [address] Phone: [phone]   Fax:  [phone]  Patient Details - DISCHARGE SUMMARY Name: Shane Klein MRN: [id number] Date of Birth: 01-Jul-1990 Referring Provider:  No ref. provider found  Encounter Date: 04/22/2023  Patient with 3 consecutive no shows - discharging per policy.  Pt was LVM by evaluating therapist following second no show 12/18 regarding policy and plan to discharge with third no show.    Sadie Haber, PT, DPT 04/22/2023, 3:15 PM  [facility] [address] [place], [place], [address] Phone: [phone]   Fax:  [phone]

## 2023-04-25 ENCOUNTER — Other Ambulatory Visit: Payer: Self-pay

## 2023-04-25 ENCOUNTER — Other Ambulatory Visit (HOSPITAL_COMMUNITY): Payer: Self-pay

## 2023-04-26 ENCOUNTER — Other Ambulatory Visit (HOSPITAL_COMMUNITY): Payer: Self-pay

## 2023-04-29 ENCOUNTER — Other Ambulatory Visit: Payer: Self-pay | Admitting: Physical Medicine and Rehabilitation

## 2023-04-29 ENCOUNTER — Other Ambulatory Visit (HOSPITAL_COMMUNITY): Payer: Self-pay

## 2023-04-29 ENCOUNTER — Other Ambulatory Visit: Payer: Self-pay

## 2023-04-29 ENCOUNTER — Encounter
Payer: Medicaid Other | Attending: Physical Medicine and Rehabilitation | Admitting: Physical Medicine and Rehabilitation

## 2023-04-29 ENCOUNTER — Encounter: Payer: Self-pay | Admitting: Physical Medicine and Rehabilitation

## 2023-04-29 ENCOUNTER — Ambulatory Visit: Payer: Medicaid Other | Admitting: Physical Therapy

## 2023-04-29 VITALS — Ht 71.0 in | Wt 186.0 lb

## 2023-04-29 DIAGNOSIS — R198 Other specified symptoms and signs involving the digestive system and abdomen: Secondary | ICD-10-CM | POA: Diagnosis not present

## 2023-04-29 DIAGNOSIS — N319 Neuromuscular dysfunction of bladder, unspecified: Secondary | ICD-10-CM | POA: Insufficient documentation

## 2023-04-29 DIAGNOSIS — W3400XA Accidental discharge from unspecified firearms or gun, initial encounter: Secondary | ICD-10-CM | POA: Insufficient documentation

## 2023-04-29 DIAGNOSIS — G8222 Paraplegia, incomplete: Secondary | ICD-10-CM | POA: Insufficient documentation

## 2023-04-29 DIAGNOSIS — N521 Erectile dysfunction due to diseases classified elsewhere: Secondary | ICD-10-CM | POA: Insufficient documentation

## 2023-04-29 DIAGNOSIS — K50912 Crohn's disease, unspecified, with intestinal obstruction: Secondary | ICD-10-CM | POA: Diagnosis not present

## 2023-04-29 DIAGNOSIS — Z9049 Acquired absence of other specified parts of digestive tract: Secondary | ICD-10-CM | POA: Diagnosis not present

## 2023-04-29 DIAGNOSIS — Z760 Encounter for issue of repeat prescription: Secondary | ICD-10-CM | POA: Insufficient documentation

## 2023-04-29 DIAGNOSIS — Z932 Ileostomy status: Secondary | ICD-10-CM | POA: Diagnosis not present

## 2023-04-29 DIAGNOSIS — G5631 Lesion of radial nerve, right upper limb: Secondary | ICD-10-CM | POA: Insufficient documentation

## 2023-04-29 DIAGNOSIS — N529 Male erectile dysfunction, unspecified: Secondary | ICD-10-CM | POA: Diagnosis not present

## 2023-04-29 DIAGNOSIS — F4310 Post-traumatic stress disorder, unspecified: Secondary | ICD-10-CM | POA: Insufficient documentation

## 2023-04-29 DIAGNOSIS — Z79899 Other long term (current) drug therapy: Secondary | ICD-10-CM | POA: Diagnosis not present

## 2023-04-29 MED ORDER — SILDENAFIL CITRATE 100 MG PO TABS: 100.0000 mg | ORAL_TABLET | Freq: Every day | ORAL | 5 refills | Status: DC | PRN

## 2023-04-29 MED ORDER — BUSPIRONE HCL 10 MG PO TABS: 15.0000 mg | ORAL_TABLET | Freq: Three times a day (TID) | ORAL | 5 refills | Status: DC

## 2023-04-29 MED ORDER — DOXAZOSIN MESYLATE 2 MG PO TABS: 2.0000 mg | ORAL_TABLET | Freq: Two times a day (BID) | ORAL | 5 refills | Status: DC

## 2023-04-29 MED ORDER — GABAPENTIN 400 MG PO CAPS: 1200.0000 mg | ORAL_CAPSULE | Freq: Three times a day (TID) | ORAL | 5 refills | Status: DC

## 2023-04-29 MED ORDER — DULOXETINE HCL 60 MG PO CPEP: 120.0000 mg | ORAL_CAPSULE | Freq: Every day | ORAL | 5 refills | Status: DC
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
## 2023-05-02 ENCOUNTER — Telehealth (INDEPENDENT_AMBULATORY_CARE_PROVIDER_SITE_OTHER): Payer: Self-pay

## 2023-05-02 ENCOUNTER — Other Ambulatory Visit (HOSPITAL_COMMUNITY): Payer: Self-pay

## 2023-05-02 ENCOUNTER — Other Ambulatory Visit (INDEPENDENT_AMBULATORY_CARE_PROVIDER_SITE_OTHER): Payer: Self-pay | Admitting: Primary Care

## 2023-05-02 ENCOUNTER — Encounter: Payer: Self-pay | Admitting: Physical Medicine and Rehabilitation

## 2023-05-02 DIAGNOSIS — S24109S Unspecified injury at unspecified level of thoracic spinal cord, sequela: Secondary | ICD-10-CM

## 2023-05-02 MED ORDER — FERROUS SULFATE 325 (65 FE) MG PO TABS
325.0000 mg | ORAL_TABLET | Freq: Two times a day (BID) | ORAL | 1 refills | Status: AC
  Filled 2023-05-02 – 2023-06-04 (×2): qty 100, 50d supply, fill #0

## 2023-05-02 NOTE — Telephone Encounter (Signed)
 Copied from CRM [id number]. Topic: Referral - Request for Referral >> May 02, 2023  3:42 PM Phill Myron wrote: Patient requesting a referral to [facility]  [phone]

## 2023-05-02 NOTE — Telephone Encounter (Signed)
 Referral has been placed.

## 2023-05-04 ENCOUNTER — Ambulatory Visit: Payer: Medicaid Other | Admitting: Physical Therapy

## 2023-05-04 NOTE — Telephone Encounter (Signed)
 Faxed to [facility] with office notes on 05/04/23

## 2023-05-05 ENCOUNTER — Other Ambulatory Visit (HOSPITAL_COMMUNITY): Payer: Self-pay

## 2023-05-11 ENCOUNTER — Ambulatory Visit: Payer: Medicaid Other | Admitting: Family Medicine

## 2023-05-11 ENCOUNTER — Ambulatory Visit: Payer: Medicaid Other | Admitting: Physical Therapy

## 2023-05-12 ENCOUNTER — Other Ambulatory Visit (HOSPITAL_COMMUNITY): Payer: Self-pay

## 2023-05-12 ENCOUNTER — Ambulatory Visit (INDEPENDENT_AMBULATORY_CARE_PROVIDER_SITE_OTHER): Payer: Self-pay | Admitting: Podiatry

## 2023-05-12 DIAGNOSIS — Z91199 Patient's noncompliance with other medical treatment and regimen due to unspecified reason: Secondary | ICD-10-CM

## 2023-05-13 ENCOUNTER — Ambulatory Visit: Payer: Medicaid Other | Admitting: Physical Therapy

## 2023-05-13 ENCOUNTER — Ambulatory Visit: Payer: Medicaid Other | Attending: Physical Medicine and Rehabilitation | Admitting: Occupational Therapy
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
## 2023-05-15 NOTE — Progress Notes (Signed)
Patient was no-show for appointment today 

## 2023-05-18 ENCOUNTER — Ambulatory Visit: Payer: Medicaid Other | Admitting: Physical Therapy

## 2023-05-25 ENCOUNTER — Ambulatory Visit: Payer: Medicaid Other | Admitting: Physical Therapy

## 2023-05-25 ENCOUNTER — Encounter: Payer: Medicaid Other | Admitting: Urology

## 2023-05-30 ENCOUNTER — Ambulatory Visit: Payer: Medicaid Other

## 2023-05-31 ENCOUNTER — Ambulatory Visit: Payer: Medicaid Other | Attending: Surgery | Admitting: Physical Therapy

## 2023-05-31 ENCOUNTER — Telehealth: Payer: Self-pay | Admitting: Physical Therapy

## 2023-05-31 NOTE — Telephone Encounter (Signed)
PT called pt about today's appointment at 1230. Pt did not answer, voicemail left.    This is patient's first no show to appointment, (evaluation).   Otelia Sergeant, PT, DPT 02/04/252:00 PM

## 2023-06-04 ENCOUNTER — Other Ambulatory Visit: Payer: Self-pay

## 2023-06-04 ENCOUNTER — Other Ambulatory Visit: Payer: Self-pay | Admitting: Physical Medicine and Rehabilitation

## 2023-06-05 ENCOUNTER — Other Ambulatory Visit: Payer: Self-pay

## 2023-06-06 ENCOUNTER — Other Ambulatory Visit: Payer: Self-pay

## 2023-06-06 ENCOUNTER — Other Ambulatory Visit (HOSPITAL_COMMUNITY): Payer: Self-pay

## 2023-06-07 ENCOUNTER — Ambulatory Visit: Payer: Medicaid Other
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
## 2023-06-08 ENCOUNTER — Encounter: Payer: Self-pay | Admitting: Physical Medicine and Rehabilitation

## 2023-06-08 ENCOUNTER — Encounter (HOSPITAL_COMMUNITY): Payer: Self-pay

## 2023-06-08 ENCOUNTER — Other Ambulatory Visit (HOSPITAL_COMMUNITY): Payer: Self-pay

## 2023-06-09 NOTE — Telephone Encounter (Signed)
 Copied from CRM [id number]. Topic: Referral - Request for Referral >> Jun 09, 2023 12:51 PM Gildardo Pounds wrote: Did the patient discuss referral with their provider in the last year? Yes (If No - schedule appointment) (If Yes - send message)  Appointment offered? No  Type of order/referral and detailed reason for visit: Pain Mangement  Preference of office, provider, location: [facility]  If referral order, have you been seen by this specialty before? Yes (If Yes, this issue or another issue? When? Where?  Can we respond through MyChart? No

## 2023-06-11 ENCOUNTER — Other Ambulatory Visit: Payer: Self-pay | Admitting: Physical Medicine and Rehabilitation

## 2023-06-11 ENCOUNTER — Other Ambulatory Visit (INDEPENDENT_AMBULATORY_CARE_PROVIDER_SITE_OTHER): Payer: Self-pay | Admitting: Primary Care

## 2023-06-13 ENCOUNTER — Other Ambulatory Visit (HOSPITAL_COMMUNITY): Payer: Self-pay

## 2023-06-13 ENCOUNTER — Encounter (HOSPITAL_COMMUNITY): Payer: Self-pay

## 2023-06-13 ENCOUNTER — Other Ambulatory Visit: Payer: Self-pay

## 2023-06-14 ENCOUNTER — Telehealth: Payer: Self-pay

## 2023-06-14 NOTE — Telephone Encounter (Signed)
Copied from CRM [id number]. Topic: Referral - Request for Referral >> Jun 14, 2023 11:04 AM Patsy Lager T wrote: Did the patient discuss referral with their provider in the last year? Yes  Appointment offered? No  Type of order/referral and detailed reason for visit: Thoracic spinal cord injury, sequela  Preference of office, provider, location: [facility], [address]  If referral order, have you been seen by this specialty before? No (If Yes, this issue or another issue? When? Where?  Can we respond through MyChart? Yes

## 2023-06-14 NOTE — Telephone Encounter (Signed)
referral was placed on 05/02/23

## 2023-06-15 NOTE — Telephone Encounter (Signed)
Please decline order. It won't let me

## 2023-06-16 IMAGING — CT CT ABD-PELV W/ CM
2 of 4 series · 16 of 46 positions shown, 18 images · IV contrast (isovue)
Comparison: CT abdomen pelvis dated 05/14/2019.

CLINICAL DATA: Concern for bowel obstruction.  History of Crohn's.

EXAM:
CT ABDOMEN AND PELVIS WITH CONTRAST
TECHNIQUE: Multidetector CT imaging of the abdomen and pelvis was performed
using the standard protocol following bolus administration of
intravenous contrast.
RADIATION DOSE REDUCTION: This exam was performed according to the
departmental dose-optimization program which includes automated
exposure control, adjustment of the mA and/or kV according to
patient size and/or use of iterative reconstruction technique.
CONTRAST:  100 cc Isovue 370

[Series 3: a/p w/ 5mm · axial · 0.83mm/px · z∈[+899,+1339]mm · 13 of 96 slices shown, 15 images]
[im 4/96  soft-tissue]
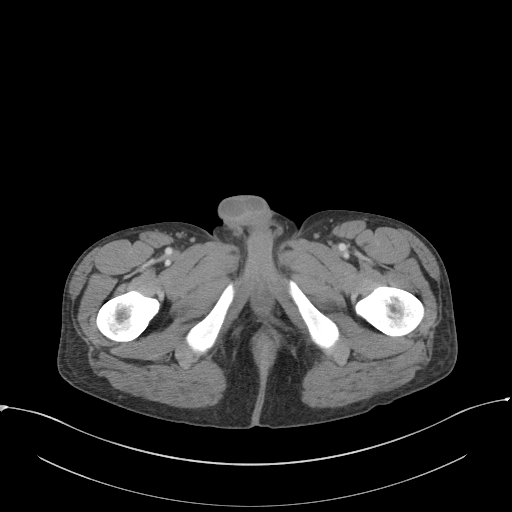
[im 4/96  bone]
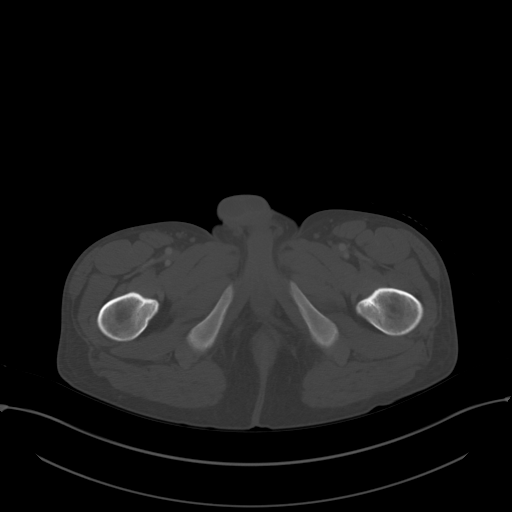
[im 12/96  soft-tissue]
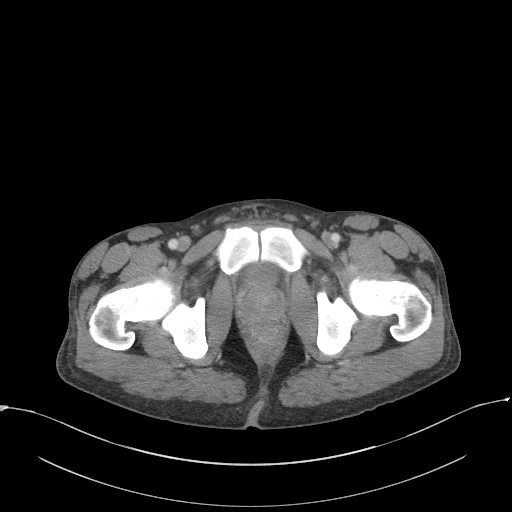
[im 20/96  soft-tissue]
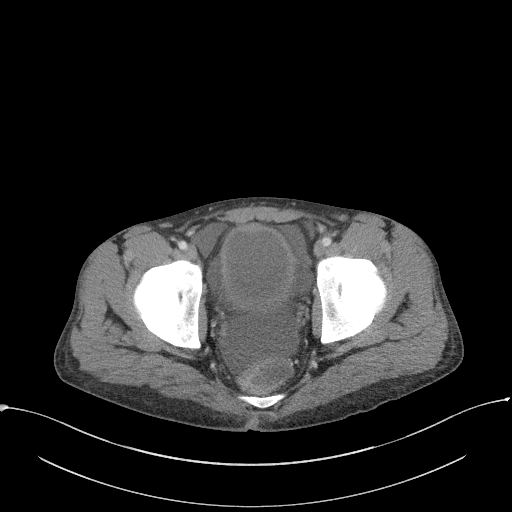
[im 27/96  soft-tissue]
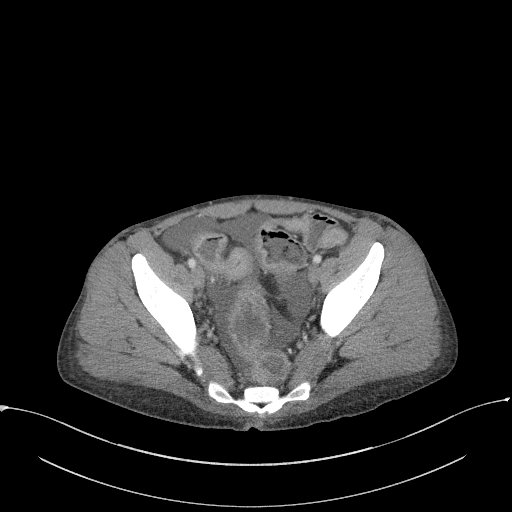
[im 35/96  soft-tissue]
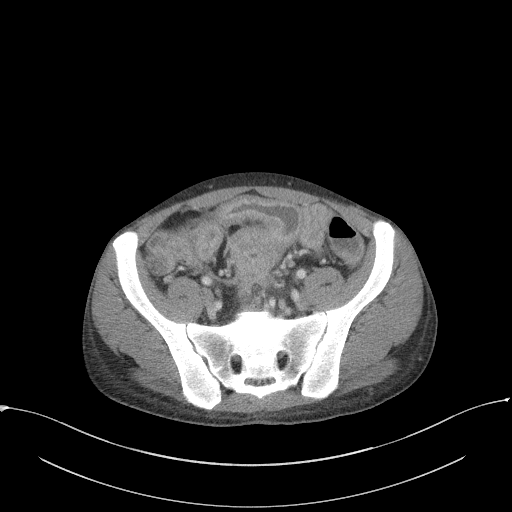
[im 42/96  soft-tissue]
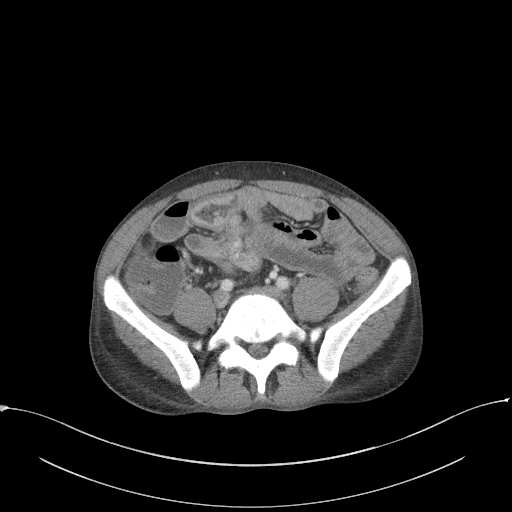
[im 50/96  soft-tissue]
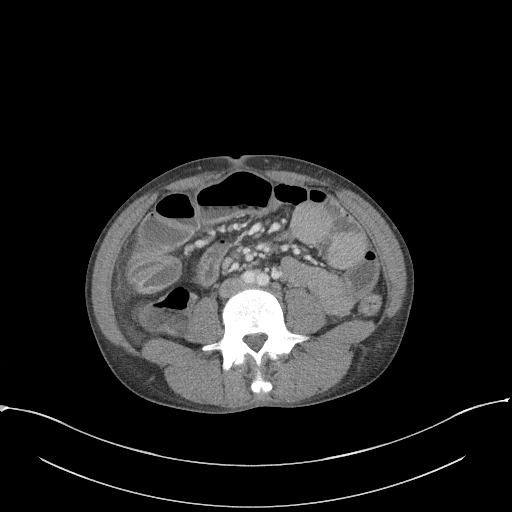
[im 54/96  soft-tissue]
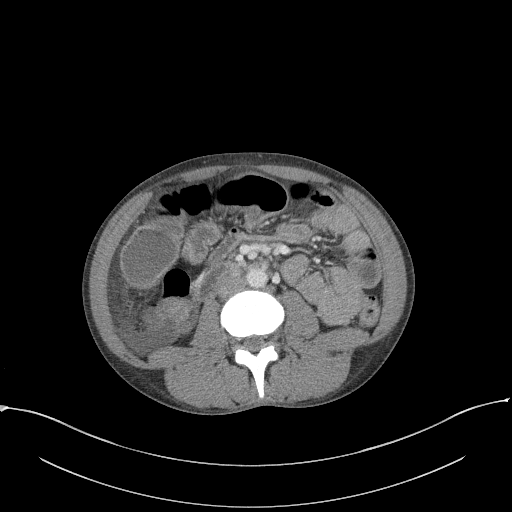
[im 61/96  soft-tissue]
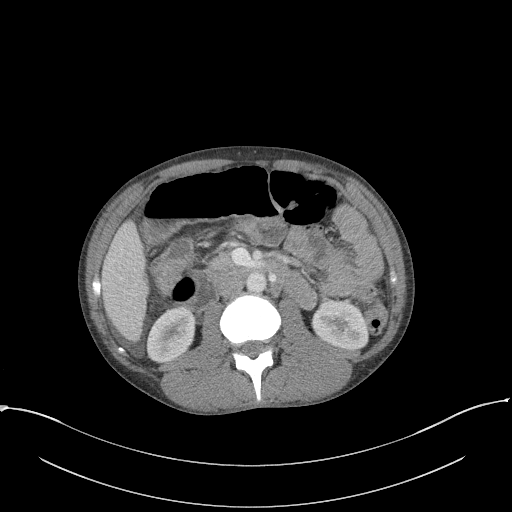
[im 61/96  bone]
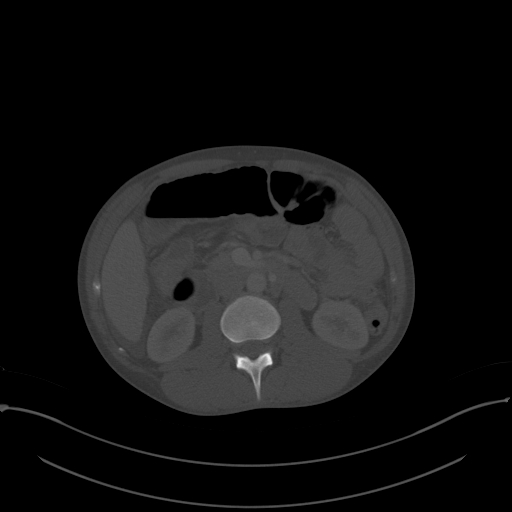
[im 69/96  soft-tissue]
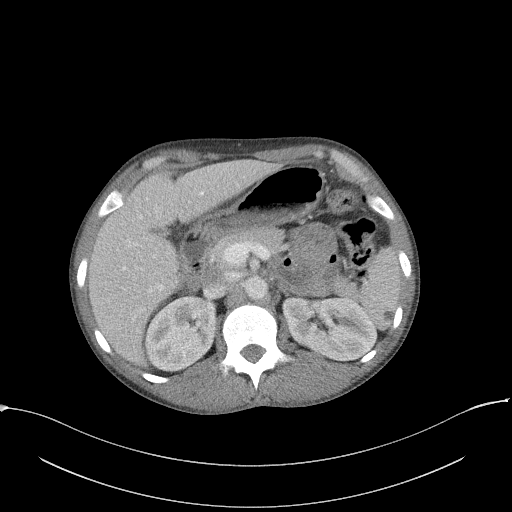
[im 77/96  soft-tissue]
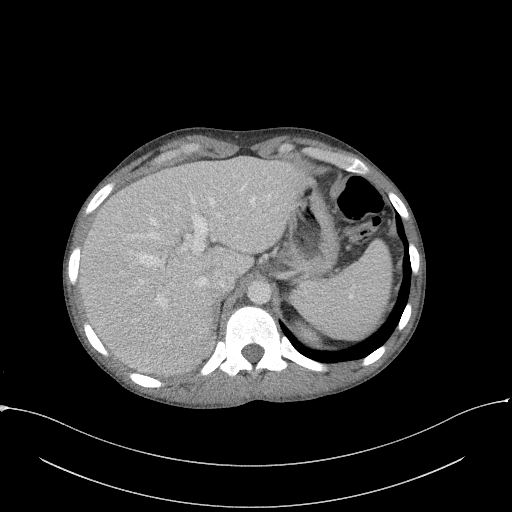
[im 84/96  soft-tissue]
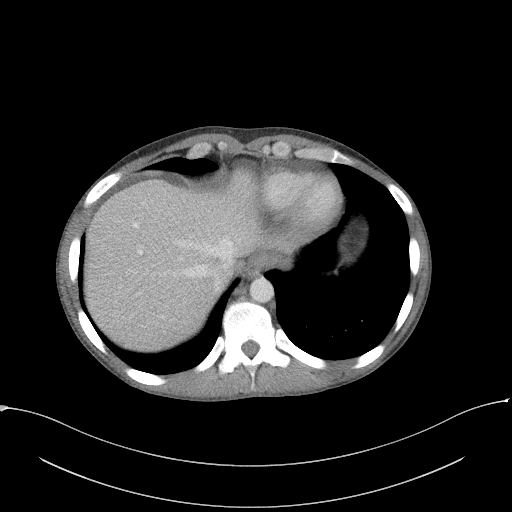
[im 92/96  soft-tissue]
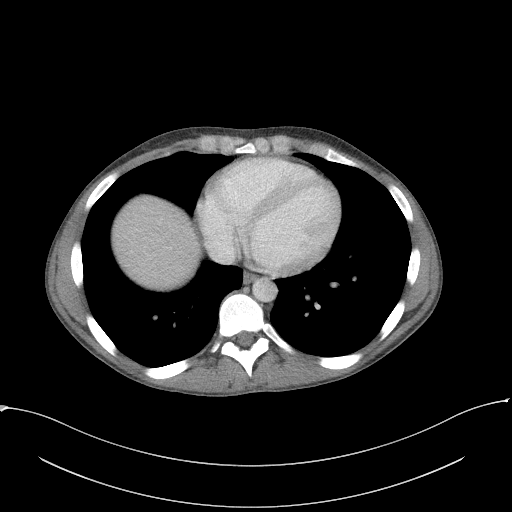

[Series 6: a/p w/ cor · coronal · 0.80mm/px · 3 of 126 slices shown]
[im 42/126  soft-tissue]
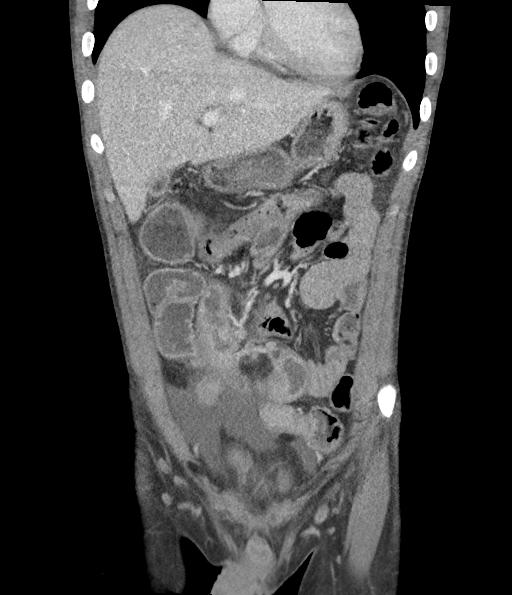
[im 56/126  soft-tissue]
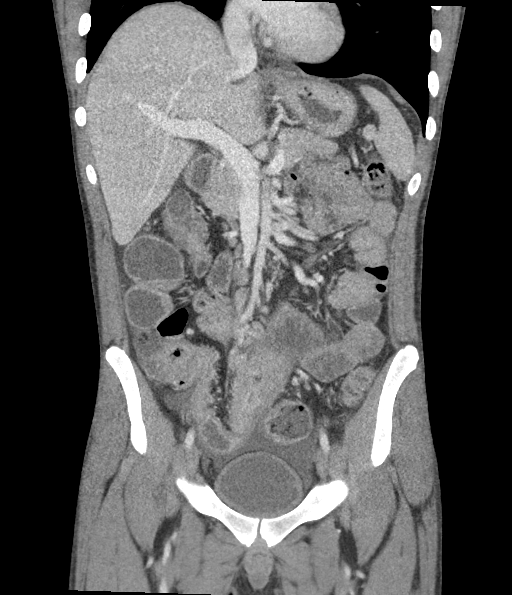
[im 70/126  soft-tissue]
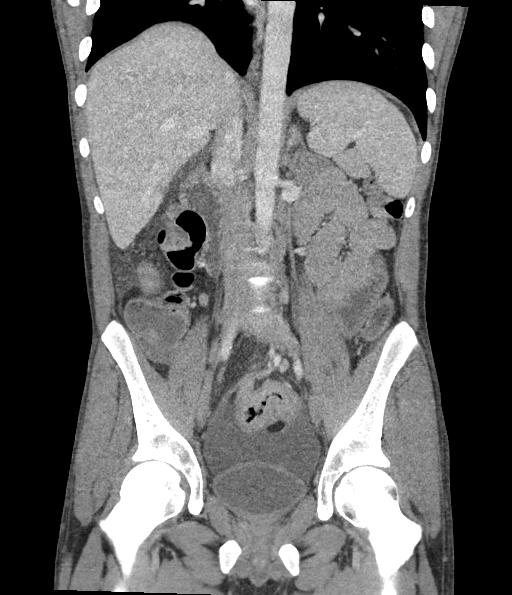

[16 of 46 positions shown; findings below may reference images not displayed]

FINDINGS: Lower chest: The visualized lung bases are clear.

No intra-abdominal free air. Small ascites, increased since the
prior CT.

Hepatobiliary: No focal liver abnormality is seen. No gallstones,
gallbladder wall thickening, or biliary dilatation.

Pancreas: Unremarkable. No pancreatic ductal dilatation or
surrounding inflammatory changes.

Spleen: Indeterminate subcentimeter splenic hypodense lesion,
possibly a cyst or hemangioma. The spleen is otherwise unremarkable.

Adrenals/Urinary Tract: The adrenal glands unremarkable. The
kidneys, visualized ureters, and urinary bladder appear
unremarkable.

Stomach/Bowel: There is inflammatory changes and thickening of
several loops of small bowel in the lower abdomen in keeping with
Crohn's disease. There is focal area of tethering of adjacent loops
of small bowel in the right lower quadrant consistent with
adhesions. Evaluation for possible fistula is limited due to
extensive adhesion and in the absence of oral contrast. There is
high-grade narrowing of the bowel at the level of the adhesion with
dilatation of the segment of the bowel proximal to the adhesion
measuring up to 5.5 cm in caliber. Additionally there is loss of fat
plane between a dilated loop of bowel and sigmoid colon likely
representing adhesion. The appendix is not identified with
certainty.

Vascular/Lymphatic: The abdominal aorta and IVC are unremarkable. No
portal venous gas. There is no adenopathy.

Reproductive: The prostate and seminal vesicles are grossly
unremarkable. No pelvic mass.

Other: None

Musculoskeletal: No acute or significant osseous findings.
IMPRESSION: 1. Active Crohn's disease with adhesion of loops of small bowel in
the right lower quadrant. There is associated narrowing and
obstruction of small bowel at the level of adhesion.
2. Small ascites, increased since the prior CT.

## 2023-06-16 IMAGING — DX DG ABDOMEN 1V
1 series · 1 of 1 positions shown · non-contrast
Comparison: CT 05/14/2019

CLINICAL DATA: History of Crohn's abdomen pain

EXAM:
ABDOMEN - 1 VIEW

[abdomen kub]
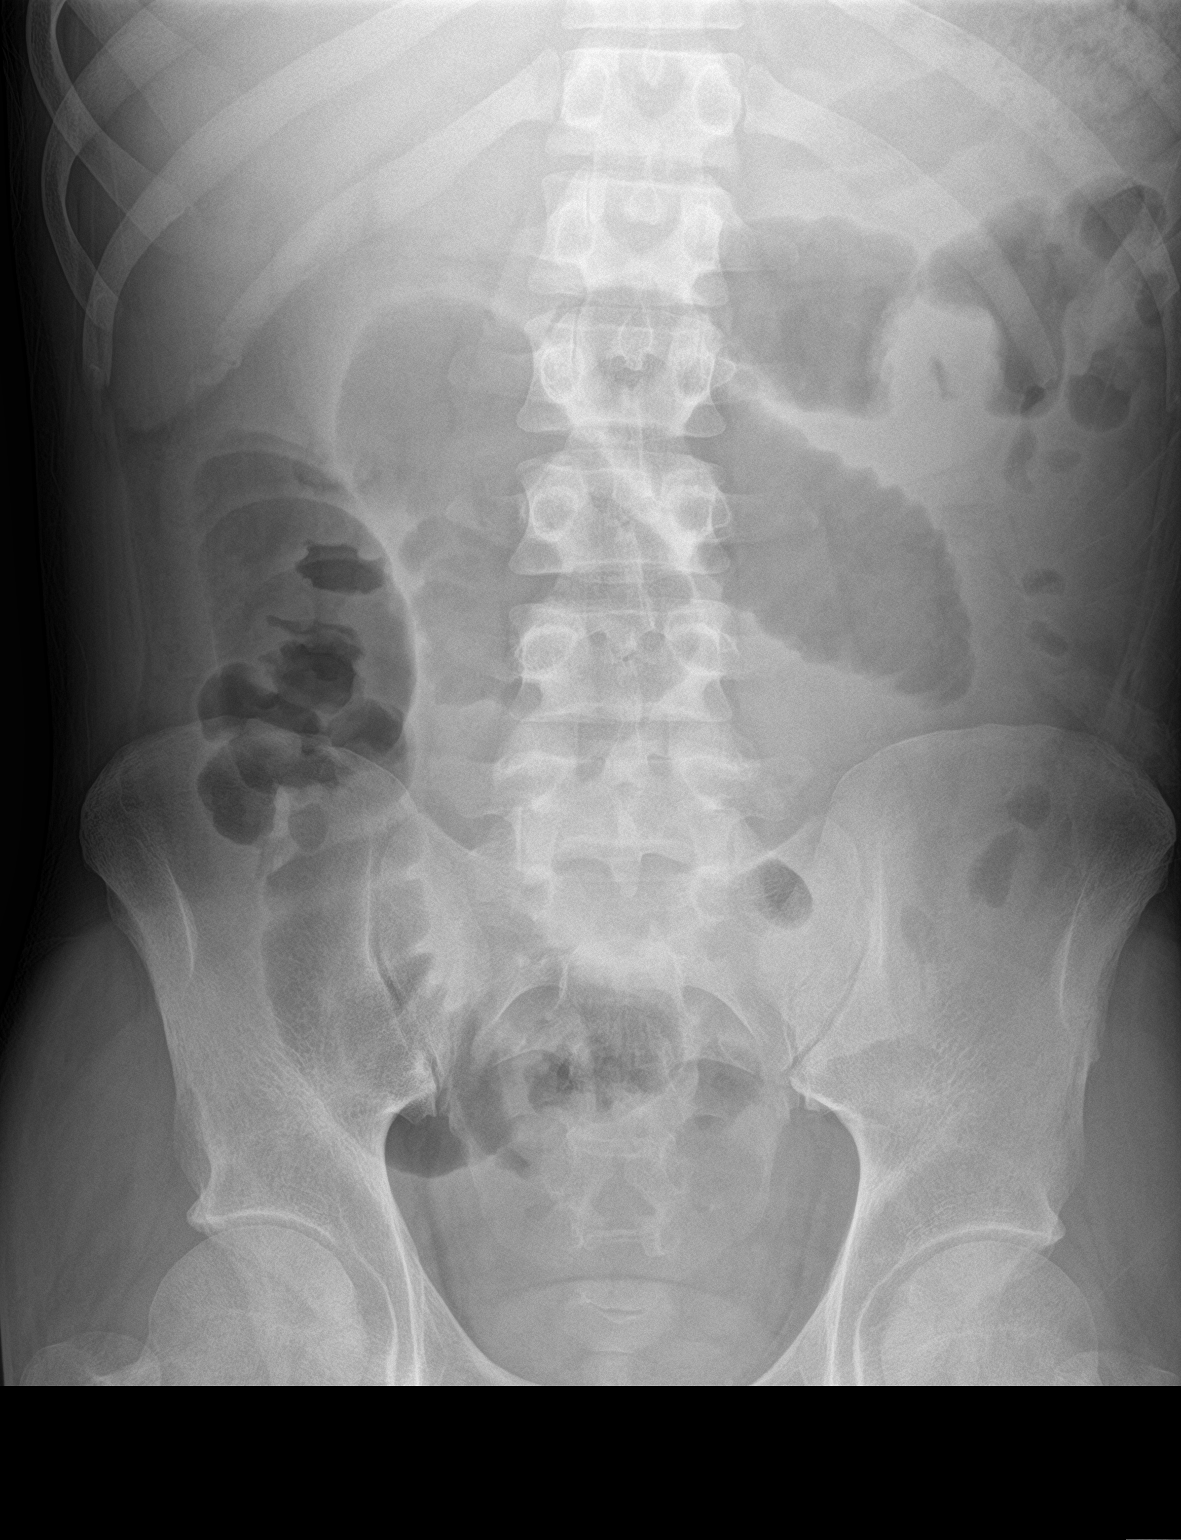

[1 of 1 positions shown; findings below may reference images not displayed]

FINDINGS: Air-filled distended central small bowel up to 5.5 cm. Scattered
colon gas. No abnormal calcification.
IMPRESSION: Air distended central small bowel which could be due to partial
obstruction or ileus.

## 2023-06-25 ENCOUNTER — Encounter: Payer: Self-pay | Admitting: Physical Medicine and Rehabilitation

## 2023-06-25 DIAGNOSIS — G8222 Paraplegia, incomplete: Secondary | ICD-10-CM

## 2023-06-27 ENCOUNTER — Other Ambulatory Visit (HOSPITAL_COMMUNITY): Payer: Self-pay

## 2023-06-27 ENCOUNTER — Other Ambulatory Visit: Payer: Self-pay | Admitting: Physical Medicine and Rehabilitation

## 2023-06-28 ENCOUNTER — Other Ambulatory Visit (HOSPITAL_COMMUNITY): Payer: Self-pay

## 2023-06-28 MED ORDER — MAGNESIUM OXIDE -MG SUPPLEMENT 400 (240 MG) MG PO TABS
800.0000 mg | ORAL_TABLET | Freq: Two times a day (BID) | ORAL | 1 refills | Status: DC
  Filled 2023-07-06: qty 120, 30d supply, fill #0

## 2023-06-30 ENCOUNTER — Encounter (INDEPENDENT_AMBULATORY_CARE_PROVIDER_SITE_OTHER): Payer: Self-pay | Admitting: Primary Care

## 2023-06-30 ENCOUNTER — Telehealth (INDEPENDENT_AMBULATORY_CARE_PROVIDER_SITE_OTHER): Payer: Self-pay | Admitting: Primary Care

## 2023-06-30 MED ORDER — GABAPENTIN 400 MG PO CAPS: 1200.0000 mg | ORAL_CAPSULE | Freq: Three times a day (TID) | ORAL | 5 refills | Status: DC

## 2023-06-30 MED ORDER — BACLOFEN 20 MG PO TABS: 30.0000 mg | ORAL_TABLET | Freq: Three times a day (TID) | ORAL | 5 refills | Status: DC

## 2023-06-30 NOTE — Telephone Encounter (Signed)
 Copied from CRM [id number]. Topic: Referral - Status >> Jun 30, 2023  9:25 AM Ivette P wrote: Reason for CRM: PT is calling to get a status update on referral on pain specialist and would like a call back [phone]

## 2023-06-30 NOTE — Telephone Encounter (Signed)
 Copied from CRM [id number]. Topic: Clinical - Medication Refill >> Jun 30, 2023  9:26 AM Fuller Canada P wrote: Most Recent Primary Care Visit:  Provider: [name]  Department: [facility]  Visit Type: HOSPITAL FU  Date: 11/08/2022  Medication:  gabapentin (NEURONTIN) 400 MG capsule baclofen (LIORESAL) 20 MG tablet   Has the patient contacted their pharmacy? Yes (Agent: If no, request that the patient contact the pharmacy for the refill. If patient does not wish to contact the pharmacy document the reason why and proceed with request.) (Agent: If yes, when and what did the pharmacy advise?)  Is this the correct pharmacy for this prescription? Yes If no, delete pharmacy and type the correct one.  This is the patient's preferred pharmacy:  [facility] [phone] - [place], [place] - [address] AT [facility] [address]  [place] [address] Phone: [phone] Fax: [phone]  Has the prescription been filled recently? No  Is the patient out of the medication? Yes  Has the patient been seen for an appointment in the last year OR does the patient have an upcoming appointment? Yes, last appt was 02/2023 no future appt scheduled   Can we respond through MyChart? Yes  Agent: Please be advised that Rx refills may take up to 3 business days. We ask that you follow-up with your pharmacy.

## 2023-06-30 NOTE — Telephone Encounter (Signed)
 Medication refill not appropriate for PCP, closing encounter. Patient is prescribed both medications by Dr. Genice Rouge with [facility]. Please direct patient to speak with her regarding refills. Called patient's [facility] pharmacy and confirmed he has 4 refills remaining of gabapentin available. Also confirmed with pharmacist that patient filled his baclofen on 06/08/23 and should have a week left of the medication.  Copied from CRM [id number]. Topic: Clinical - Medication Refill >> Jun 30, 2023  9:26 AM Fuller Canada P wrote: Most Recent Primary Care Visit:  Provider: [name]  Department: [facility]  Visit Type: HOSPITAL FU  Date: 11/08/2022  Medication:  gabapentin (NEURONTIN) 400 MG capsule baclofen (LIORESAL) 20 MG tablet   Has the patient contacted their pharmacy? Yes (Agent: If no, request that the patient contact the pharmacy for the refill. If patient does not wish to contact the pharmacy document the reason why and proceed with request.) (Agent: If yes, when and what did the pharmacy advise?)  Is this the correct pharmacy for this prescription? Yes If no, delete pharmacy and type the correct one.  This is the patient's preferred pharmacy:  [facility] [phone] - [place], [place] - [address] AT [facility] [address] [place] [address] Phone: [phone] Fax: [phone]  Has the prescription been filled recently? No  Is the patient out of the medication? Yes  Has the patient been seen for an appointment in the last year OR does the patient have an upcoming appointment? Yes, last appt was 02/2023 no future appt scheduled   Can we respond through MyChart? Yes  Agent: Please be advised that Rx refills may take up to 3 business days. We ask that you follow-up with your pharmacy.

## 2023-07-01 ENCOUNTER — Ambulatory Visit: Admitting: Occupational Therapy

## 2023-07-01 ENCOUNTER — Telehealth: Payer: Self-pay

## 2023-07-01 ENCOUNTER — Ambulatory Visit
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
## 2023-07-01 NOTE — Telephone Encounter (Signed)
 Copied from CRM [id number]. Topic: Referral - Question >> Jul 01, 2023  4:25 PM Yolanda T wrote: Reason for CRM: patient wanted to make some suggestions of where he wants to go for therapy. He would like to also send a referral to [facility] and [facility].

## 2023-07-03 ENCOUNTER — Other Ambulatory Visit (INDEPENDENT_AMBULATORY_CARE_PROVIDER_SITE_OTHER): Payer: Self-pay | Admitting: Primary Care

## 2023-07-03 DIAGNOSIS — S24109S Unspecified injury at unspecified level of thoracic spinal cord, sequela: Secondary | ICD-10-CM

## 2023-07-04 NOTE — Telephone Encounter (Signed)
 Patient calling to check on status of referral. Advised referral sent to [facility], patient requesting referral sent to [facility] Ph: [phone]

## 2023-07-06 ENCOUNTER — Other Ambulatory Visit (HOSPITAL_COMMUNITY): Payer: Self-pay

## 2023-07-11 ENCOUNTER — Ambulatory Visit: Admitting: Occupational Therapy

## 2023-07-11 ENCOUNTER — Other Ambulatory Visit: Payer: Self-pay

## 2023-07-11 ENCOUNTER — Ambulatory Visit: Attending: Physical Medicine and Rehabilitation | Admitting: Physical Therapy

## 2023-07-11 ENCOUNTER — Encounter: Payer: Self-pay | Admitting: Occupational Therapy

## 2023-07-11 DIAGNOSIS — R2681 Unsteadiness on feet: Secondary | ICD-10-CM | POA: Insufficient documentation

## 2023-07-11 DIAGNOSIS — G8222 Paraplegia, incomplete: Secondary | ICD-10-CM

## 2023-07-11 DIAGNOSIS — M6281 Muscle weakness (generalized): Secondary | ICD-10-CM

## 2023-07-11 DIAGNOSIS — R278 Other lack of coordination: Secondary | ICD-10-CM

## 2023-07-11 DIAGNOSIS — R29818 Other symptoms and signs involving the nervous system: Secondary | ICD-10-CM

## 2023-07-11 DIAGNOSIS — R29898 Other symptoms and signs involving the musculoskeletal system: Secondary | ICD-10-CM

## 2023-07-11 DIAGNOSIS — G5631 Lesion of radial nerve, right upper limb: Secondary | ICD-10-CM | POA: Insufficient documentation

## 2023-07-11 DIAGNOSIS — R2689 Other abnormalities of gait and mobility: Secondary | ICD-10-CM | POA: Insufficient documentation
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
## 2023-07-20 ENCOUNTER — Encounter: Payer: Self-pay | Admitting: Occupational Therapy

## 2023-07-20 ENCOUNTER — Ambulatory Visit: Admitting: Occupational Therapy

## 2023-07-20 ENCOUNTER — Telehealth: Payer: Self-pay | Admitting: Physical Therapy

## 2023-07-20 ENCOUNTER — Ambulatory Visit: Admitting: Physical Therapy

## 2023-07-20 NOTE — Therapy (Signed)
 [facility] [address] [place], [place], [address] Phone: [phone]   Fax:  [phone]  Patient Details  Name: ZADOK HOLAWAY MRN: [id number] Date of Birth: 10/22/1990  Encounter Date: 07/20/2023  Pt no-showed for P.T. this morning and initial attempt to contact patient via phone was unsuccessful due to no answer and full mailbox.  Therefore O.T. reached out to alternate contact - mother Thora Lance, who indicated that patient was incarcerated.  Mother indicated she had this office's phone number and would have him contact this office to schedule therapy visits once he gets home.  O.T. cancelled his remaining appointments and indicated his POC would be closed if no contact was received by mid-April.   Victorino Sparrow, OT 07/20/2023, 12:14 PM  [facility] [address] [place], [place], [address] Phone: [phone]   Fax:  [phone]

## 2023-07-22 ENCOUNTER — Other Ambulatory Visit (HOSPITAL_COMMUNITY): Payer: Self-pay

## 2023-07-23 ENCOUNTER — Encounter (HOSPITAL_COMMUNITY): Payer: Self-pay

## 2023-07-23 ENCOUNTER — Inpatient Hospital Stay (HOSPITAL_COMMUNITY)
Admission: EM | Admit: 2023-07-23 | Discharge: 2023-07-27 | DRG: 682 | Disposition: A | Discharge status: Home / Self Care | Attending: Internal Medicine | Admitting: Internal Medicine

## 2023-07-23 ENCOUNTER — Other Ambulatory Visit: Payer: Self-pay

## 2023-07-23 DIAGNOSIS — Z66 Do not resuscitate: Secondary | ICD-10-CM | POA: Diagnosis present

## 2023-07-23 DIAGNOSIS — Z8249 Family history of ischemic heart disease and other diseases of the circulatory system: Secondary | ICD-10-CM

## 2023-07-23 DIAGNOSIS — F4310 Post-traumatic stress disorder, unspecified: Secondary | ICD-10-CM | POA: Diagnosis present

## 2023-07-23 DIAGNOSIS — Z833 Family history of diabetes mellitus: Secondary | ICD-10-CM

## 2023-07-23 DIAGNOSIS — G9341 Metabolic encephalopathy: Secondary | ICD-10-CM | POA: Diagnosis present

## 2023-07-23 DIAGNOSIS — Z7952 Long term (current) use of systemic steroids: Secondary | ICD-10-CM

## 2023-07-23 DIAGNOSIS — M6282 Rhabdomyolysis: Secondary | ICD-10-CM | POA: Diagnosis present

## 2023-07-23 DIAGNOSIS — Z79899 Other long term (current) drug therapy: Secondary | ICD-10-CM

## 2023-07-23 DIAGNOSIS — R4182 Altered mental status, unspecified: Secondary | ICD-10-CM | POA: Diagnosis not present

## 2023-07-23 DIAGNOSIS — N179 Acute kidney failure, unspecified: Principal | ICD-10-CM | POA: Diagnosis present

## 2023-07-23 DIAGNOSIS — R7401 Elevation of levels of liver transaminase levels: Secondary | ICD-10-CM | POA: Diagnosis present

## 2023-07-23 DIAGNOSIS — Z87891 Personal history of nicotine dependence: Secondary | ICD-10-CM

## 2023-07-23 DIAGNOSIS — Z932 Ileostomy status: Secondary | ICD-10-CM

## 2023-07-23 DIAGNOSIS — R32 Unspecified urinary incontinence: Secondary | ICD-10-CM | POA: Diagnosis present

## 2023-07-23 DIAGNOSIS — E871 Hypo-osmolality and hyponatremia: Secondary | ICD-10-CM | POA: Diagnosis present

## 2023-07-23 DIAGNOSIS — R400 Somnolence: Principal | ICD-10-CM

## 2023-07-23 DIAGNOSIS — E739 Lactose intolerance, unspecified: Secondary | ICD-10-CM | POA: Diagnosis present

## 2023-07-23 DIAGNOSIS — E869 Volume depletion, unspecified: Secondary | ICD-10-CM | POA: Diagnosis present

## 2023-07-23 DIAGNOSIS — K219 Gastro-esophageal reflux disease without esophagitis: Secondary | ICD-10-CM | POA: Diagnosis present

## 2023-07-23 MED ORDER — THIAMINE HCL 100 MG/ML IJ SOLN
100.0000 mg | Freq: Once | INTRAMUSCULAR | Status: AC
  Administered 2023-07-24: 100 mg via INTRAVENOUS
  Filled 2023-07-23: qty 2
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
## 2023-07-24 ENCOUNTER — Emergency Department (HOSPITAL_COMMUNITY)

## 2023-07-24 DIAGNOSIS — Z8249 Family history of ischemic heart disease and other diseases of the circulatory system: Secondary | ICD-10-CM | POA: Diagnosis not present

## 2023-07-24 DIAGNOSIS — R32 Unspecified urinary incontinence: Secondary | ICD-10-CM | POA: Diagnosis present

## 2023-07-24 DIAGNOSIS — R569 Unspecified convulsions: Secondary | ICD-10-CM | POA: Diagnosis not present

## 2023-07-24 DIAGNOSIS — N179 Acute kidney failure, unspecified: Secondary | ICD-10-CM | POA: Diagnosis present

## 2023-07-24 DIAGNOSIS — G9341 Metabolic encephalopathy: Secondary | ICD-10-CM | POA: Diagnosis present

## 2023-07-24 DIAGNOSIS — N170 Acute kidney failure with tubular necrosis: Secondary | ICD-10-CM

## 2023-07-24 DIAGNOSIS — M6282 Rhabdomyolysis: Secondary | ICD-10-CM | POA: Diagnosis present

## 2023-07-24 DIAGNOSIS — K219 Gastro-esophageal reflux disease without esophagitis: Secondary | ICD-10-CM | POA: Diagnosis present

## 2023-07-24 DIAGNOSIS — E869 Volume depletion, unspecified: Secondary | ICD-10-CM | POA: Diagnosis present

## 2023-07-24 DIAGNOSIS — Z7952 Long term (current) use of systemic steroids: Secondary | ICD-10-CM | POA: Diagnosis not present

## 2023-07-24 DIAGNOSIS — R7401 Elevation of levels of liver transaminase levels: Secondary | ICD-10-CM | POA: Diagnosis present

## 2023-07-24 DIAGNOSIS — F4310 Post-traumatic stress disorder, unspecified: Secondary | ICD-10-CM | POA: Diagnosis present

## 2023-07-24 DIAGNOSIS — Z79899 Other long term (current) drug therapy: Secondary | ICD-10-CM | POA: Diagnosis not present

## 2023-07-24 DIAGNOSIS — E739 Lactose intolerance, unspecified: Secondary | ICD-10-CM | POA: Diagnosis present

## 2023-07-24 DIAGNOSIS — Z932 Ileostomy status: Secondary | ICD-10-CM | POA: Diagnosis not present

## 2023-07-24 DIAGNOSIS — Z87891 Personal history of nicotine dependence: Secondary | ICD-10-CM | POA: Diagnosis not present

## 2023-07-24 DIAGNOSIS — R4182 Altered mental status, unspecified: Secondary | ICD-10-CM | POA: Diagnosis present

## 2023-07-24 DIAGNOSIS — Z833 Family history of diabetes mellitus: Secondary | ICD-10-CM | POA: Diagnosis not present

## 2023-07-24 DIAGNOSIS — E871 Hypo-osmolality and hyponatremia: Secondary | ICD-10-CM | POA: Diagnosis present

## 2023-07-24 DIAGNOSIS — Z66 Do not resuscitate: Secondary | ICD-10-CM | POA: Diagnosis present

## 2023-07-24 LAB — COMPREHENSIVE METABOLIC PANEL WITH GFR
ALT: 557 U/L — ABNORMAL HIGH (ref 0–44)
ALT: 460 U/L — ABNORMAL HIGH (ref 0–44)
AST: 220 U/L — ABNORMAL HIGH (ref 15–41)
AST: 190 U/L — ABNORMAL HIGH (ref 15–41)
Albumin: 5.4 g/dL — ABNORMAL HIGH (ref 3.5–5.0)
Albumin: 4.7 g/dL (ref 3.5–5.0)
Alkaline Phosphatase: 189 U/L — ABNORMAL HIGH (ref 38–126)
Alkaline Phosphatase: 187 U/L — ABNORMAL HIGH (ref 38–126)
Anion gap: 17 — ABNORMAL HIGH (ref 5–15)
Anion gap: 14 (ref 5–15)
BUN: 79 mg/dL — ABNORMAL HIGH (ref 6–20)
BUN: 68 mg/dL — ABNORMAL HIGH (ref 6–20)
CO2: 25 mmol/L (ref 22–32)
CO2: 28 mmol/L (ref 22–32)
Calcium: 10.2 mg/dL (ref 8.9–10.3)
Calcium: 9.4 mg/dL (ref 8.9–10.3)
Chloride: 87 mmol/L — ABNORMAL LOW (ref 98–111)
Chloride: 89 mmol/L — ABNORMAL LOW (ref 98–111)
Creatinine, Ser: 2.94 mg/dL — ABNORMAL HIGH (ref 0.61–1.24)
Creatinine, Ser: 2.78 mg/dL — ABNORMAL HIGH (ref 0.61–1.24)
GFR, Estimated: 28 mL/min — ABNORMAL LOW (ref >60)
GFR, Estimated: 30 mL/min — ABNORMAL LOW (ref >60)
Glucose, Bld: 119 mg/dL — ABNORMAL HIGH (ref 70–99)
Glucose, Bld: 88 mg/dL (ref 70–99)
Potassium: 3.6 mmol/L (ref 3.5–5.1)
Potassium: 3.2 mmol/L — ABNORMAL LOW (ref 3.5–5.1)
Sodium: 129 mmol/L — ABNORMAL LOW (ref 135–145)
Sodium: 131 mmol/L — ABNORMAL LOW (ref 135–145)
Total Bilirubin: 1.5 mg/dL — ABNORMAL HIGH (ref 0.0–1.2)
Total Bilirubin: 1.6 mg/dL — ABNORMAL HIGH (ref 0.0–1.2)
Total Protein: 10.3 g/dL — ABNORMAL HIGH (ref 6.5–8.1)
Total Protein: 9.5 g/dL — ABNORMAL HIGH (ref 6.5–8.1)

## 2023-07-24 LAB — CBC WITH DIFFERENTIAL/PLATELET
Abs Immature Granulocytes: 0.02 10*3/uL (ref 0.00–0.07)
Basophils Absolute: 0 10*3/uL (ref 0.0–0.1)
Basophils Relative: 0 %
Eosinophils Absolute: 0.1 10*3/uL (ref 0.0–0.5)
Eosinophils Relative: 1 %
HCT: 43 % (ref 39.0–52.0)
Hemoglobin: 14.9 g/dL (ref 13.0–17.0)
Immature Granulocytes: 0 %
Lymphocytes Relative: 15 %
Lymphs Abs: 1.1 10*3/uL (ref 0.7–4.0)
MCH: 28.3 pg (ref 26.0–34.0)
MCHC: 34.7 g/dL (ref 30.0–36.0)
MCV: 81.6 fL (ref 80.0–100.0)
Monocytes Absolute: 0.9 10*3/uL (ref 0.1–1.0)
Monocytes Relative: 13 %
Neutro Abs: 5 10*3/uL (ref 1.7–7.7)
Neutrophils Relative %: 71 %
Platelets: 328 10*3/uL (ref 150–400)
RBC: 5.27 MIL/uL (ref 4.22–5.81)
RDW: 14.7 % (ref 11.5–15.5)
WBC: 7.1 10*3/uL (ref 4.0–10.5)
nRBC: 0 % (ref 0.0–0.2)

## 2023-07-24 LAB — URINALYSIS, W/ REFLEX TO CULTURE (INFECTION SUSPECTED)
Bacteria, UA: NONE SEEN
Bilirubin Urine: NEGATIVE
Glucose, UA: NEGATIVE mg/dL
Ketones, ur: NEGATIVE mg/dL
Leukocytes,Ua: NEGATIVE
Nitrite: NEGATIVE
Protein, ur: 100 mg/dL — AB
Specific Gravity, Urine: 1.011 (ref 1.005–1.030)
pH: 5 (ref 5.0–8.0)

## 2023-07-24 LAB — AMMONIA: Ammonia: 26 umol/L (ref 9–35)

## 2023-07-24 LAB — RAPID URINE DRUG SCREEN, HOSP PERFORMED
Amphetamines: NOT DETECTED
Barbiturates: NOT DETECTED
Benzodiazepines: NOT DETECTED
Cocaine: NOT DETECTED
Opiates: NOT DETECTED
Tetrahydrocannabinol: POSITIVE — AB

## 2023-07-24 LAB — HEPATITIS PANEL, ACUTE
HCV Ab: NONREACTIVE
Hep A IgM: NONREACTIVE
Hep B C IgM: NONREACTIVE
Hepatitis B Surface Ag: NONREACTIVE

## 2023-07-24 LAB — BLOOD GAS, VENOUS
Acid-Base Excess: 7 mmol/L — ABNORMAL HIGH (ref 0.0–2.0)
Bicarbonate: 33.4 mmol/L — ABNORMAL HIGH (ref 20.0–28.0)
O2 Saturation: 74.7 %
Patient temperature: 37
pCO2, Ven: 54 mmHg (ref 44–60)
pH, Ven: 7.4 (ref 7.25–7.43)
pO2, Ven: 46 mmHg — ABNORMAL HIGH (ref 32–45)

## 2023-07-24 LAB — CREATININE, URINE, RANDOM: Creatinine, Urine: 167 mg/dL

## 2023-07-24 LAB — SALICYLATE LEVEL: Salicylate Lvl: <7 mg/dL — ABNORMAL LOW (ref 7.0–30.0)

## 2023-07-24 LAB — SODIUM, URINE, RANDOM: Sodium, Ur: <10 mmol/L

## 2023-07-24 LAB — HIV ANTIBODY (ROUTINE TESTING W REFLEX): HIV Screen 4th Generation wRfx: NONREACTIVE

## 2023-07-24 LAB — BASIC METABOLIC PANEL WITH GFR
Anion gap: 13 (ref 5–15)
BUN: 63 mg/dL — ABNORMAL HIGH (ref 6–20)
CO2: 26 mmol/L (ref 22–32)
Calcium: 9.1 mg/dL (ref 8.9–10.3)
Chloride: 93 mmol/L — ABNORMAL LOW (ref 98–111)
Creatinine, Ser: 2.43 mg/dL — ABNORMAL HIGH (ref 0.61–1.24)
GFR, Estimated: 35 mL/min — ABNORMAL LOW (ref >60)
Glucose, Bld: 79 mg/dL (ref 70–99)
Potassium: 3.4 mmol/L — ABNORMAL LOW (ref 3.5–5.1)
Sodium: 132 mmol/L — ABNORMAL LOW (ref 135–145)

## 2023-07-24 LAB — I-STAT CHEM 8, ED
BUN: 65 mg/dL — ABNORMAL HIGH (ref 6–20)
Calcium, Ion: 1.11 mmol/L — ABNORMAL LOW (ref 1.15–1.40)
Chloride: 92 mmol/L — ABNORMAL LOW (ref 98–111)
Creatinine, Ser: 3.7 mg/dL — ABNORMAL HIGH (ref 0.61–1.24)
Glucose, Bld: 116 mg/dL — ABNORMAL HIGH (ref 70–99)
HCT: 47 % (ref 39.0–52.0)
Hemoglobin: 16 g/dL (ref 13.0–17.0)
Potassium: 3.7 mmol/L (ref 3.5–5.1)
Sodium: 131 mmol/L — ABNORMAL LOW (ref 135–145)
TCO2: 29 mmol/L (ref 22–32)

## 2023-07-24 LAB — CBG MONITORING, ED: Glucose-Capillary: 127 mg/dL — ABNORMAL HIGH (ref 70–99)

## 2023-07-24 LAB — ETHANOL: Alcohol, Ethyl (B): <10 mg/dL (ref <10)

## 2023-07-24 LAB — I-STAT CG4 LACTIC ACID, ED: Lactic Acid, Venous: 1.4 mmol/L (ref 0.5–1.9)

## 2023-07-24 LAB — ACETAMINOPHEN LEVEL: Acetaminophen (Tylenol), Serum: <10 ug/mL — ABNORMAL LOW (ref 10–30)

## 2023-07-24 LAB — MRSA NEXT GEN BY PCR, NASAL: MRSA by PCR Next Gen: NOT DETECTED

## 2023-07-24 LAB — CK: Total CK: 1101 U/L — ABNORMAL HIGH (ref 49–397)

## 2023-07-24 MED ORDER — ALBUTEROL SULFATE (2.5 MG/3ML) 0.083% IN NEBU: 2.5000 mg | INHALATION_SOLUTION | RESPIRATORY_TRACT | Status: DC | PRN

## 2023-07-24 MED ORDER — SODIUM CHLORIDE 0.9 % IV SOLN: INTRAVENOUS | Status: DC

## 2023-07-24 MED ORDER — OXYCODONE HCL 5 MG PO TABS
5.0000 mg | ORAL_TABLET | ORAL | Status: DC | PRN
  Administered 2023-07-25 – 2023-07-27 (×5): 5 mg via ORAL
  Filled 2023-07-24 (×5): qty 1

## 2023-07-24 MED ORDER — ORAL CARE MOUTH RINSE: 15.0000 mL | OROMUCOSAL | Status: DC | PRN

## 2023-07-24 MED ORDER — TRAMADOL HCL 50 MG PO TABS
50.0000 mg | ORAL_TABLET | Freq: Three times a day (TID) | ORAL | Status: DC | PRN
  Administered 2023-07-24 – 2023-07-27 (×5): 50 mg via ORAL
  Filled 2023-07-24 (×5): qty 1

## 2023-07-24 MED ORDER — SODIUM CHLORIDE 0.9 % IV BOLUS
1000.0000 mL | Freq: Once | INTRAVENOUS | Status: AC
  Administered 2023-07-24: 1000 mL via INTRAVENOUS

## 2023-07-24 MED ORDER — ONDANSETRON HCL 4 MG PO TABS: 4.0000 mg | ORAL_TABLET | Freq: Four times a day (QID) | ORAL | Status: DC | PRN

## 2023-07-24 MED ORDER — HEPARIN SODIUM (PORCINE) 5000 UNIT/ML IJ SOLN
5000.0000 [IU] | Freq: Three times a day (TID) | INTRAMUSCULAR | Status: DC
  Administered 2023-07-24 – 2023-07-27 (×10): 5000 [IU] via SUBCUTANEOUS
  Filled 2023-07-24 (×9): qty 1

## 2023-07-24 MED ORDER — ONDANSETRON HCL 4 MG/2ML IJ SOLN
4.0000 mg | Freq: Four times a day (QID) | INTRAMUSCULAR | Status: DC | PRN
Start: 2023-07-24 — End: 2023-07-27

## 2023-07-24 NOTE — ED Notes (Signed)
 Patient transported to CT
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
## 2023-07-25 ENCOUNTER — Inpatient Hospital Stay (HOSPITAL_COMMUNITY)
Admit: 2023-07-25 | Discharge: 2023-07-25 | Disposition: A | Discharge status: Home / Self Care | Attending: Internal Medicine | Admitting: Internal Medicine

## 2023-07-25 DIAGNOSIS — R4182 Altered mental status, unspecified: Secondary | ICD-10-CM | POA: Diagnosis not present

## 2023-07-25 DIAGNOSIS — R7401 Elevation of levels of liver transaminase levels: Secondary | ICD-10-CM

## 2023-07-25 DIAGNOSIS — R569 Unspecified convulsions: Secondary | ICD-10-CM | POA: Diagnosis not present

## 2023-07-25 DIAGNOSIS — N170 Acute kidney failure with tubular necrosis: Secondary | ICD-10-CM | POA: Diagnosis not present

## 2023-07-25 LAB — COMPREHENSIVE METABOLIC PANEL WITH GFR
ALT: 353 U/L — ABNORMAL HIGH (ref 0–44)
AST: 123 U/L — ABNORMAL HIGH (ref 15–41)
Albumin: 4.1 g/dL (ref 3.5–5.0)
Alkaline Phosphatase: 166 U/L — ABNORMAL HIGH (ref 38–126)
Anion gap: 12 (ref 5–15)
BUN: 56 mg/dL — ABNORMAL HIGH (ref 6–20)
CO2: 27 mmol/L (ref 22–32)
Calcium: 9.4 mg/dL (ref 8.9–10.3)
Chloride: 94 mmol/L — ABNORMAL LOW (ref 98–111)
Creatinine, Ser: 2.2 mg/dL — ABNORMAL HIGH (ref 0.61–1.24)
GFR, Estimated: 40 mL/min — ABNORMAL LOW (ref >60)
Glucose, Bld: 94 mg/dL (ref 70–99)
Potassium: 3.5 mmol/L (ref 3.5–5.1)
Sodium: 133 mmol/L — ABNORMAL LOW (ref 135–145)
Total Bilirubin: 1.4 mg/dL — ABNORMAL HIGH (ref 0.0–1.2)
Total Protein: 8.6 g/dL — ABNORMAL HIGH (ref 6.5–8.1)

## 2023-07-25 LAB — CBC
HCT: 39.6 % (ref 39.0–52.0)
Hemoglobin: 12.8 g/dL — ABNORMAL LOW (ref 13.0–17.0)
MCH: 27.8 pg (ref 26.0–34.0)
MCHC: 32.3 g/dL (ref 30.0–36.0)
MCV: 86.1 fL (ref 80.0–100.0)
Platelets: 284 10*3/uL (ref 150–400)
RBC: 4.6 MIL/uL (ref 4.22–5.81)
RDW: 15.2 % (ref 11.5–15.5)
WBC: 6.7 10*3/uL (ref 4.0–10.5)
nRBC: 0 % (ref 0.0–0.2)

## 2023-07-25 MED ORDER — CAPSAICIN 0.025 % EX CREA
TOPICAL_CREAM | Freq: Two times a day (BID) | CUTANEOUS | Status: AC
  Administered 2023-07-26: 1 via TOPICAL
  Filled 2023-07-25: qty 60

## 2023-07-25 MED ORDER — OXYCODONE HCL 5 MG PO TABS: 5.0000 mg | ORAL_TABLET | Freq: Once | ORAL | Status: DC

## 2023-07-25 NOTE — Progress Notes (Signed)
 [facility] Progress Note  Subjective:  Seen in room, no c/o's today Creat continues to improve, down to 2.2 today  Vitals:   07/24/23 1242 07/24/23 2011 07/25/23 0549 07/25/23 1306  BP: 123/85 138/87 133/89 (!) 146/82  Pulse: 66 67 70 65  Resp: 18 15 14    Temp: 98.2 F (36.8 C) 98 F (36.7 C) 97.6 F (36.4 C) 98.4 F (36.9 C)  TempSrc: Oral Oral Oral Oral  SpO2: 99% 93% 100% 100%  Weight:      Height:        Exam: General appearance: no distress Resp: clear to auscultation bilaterally Cardio: regular rate and rhythm GI: ileostomy in place, multiple scars across abdomen, +BS, nontender Extremities: extremities normal, atraumatic, no cyanosis or edema Neuro: nonfocal, responsive, appropriate      Assessment/ Plan: AKI - UNa was <10 consistent with pre-renal vs acute GN.  He may have become volume depleted due to high output from his ileostomy. He also had blood and protein on his UA, which could be a GN.  IVF's were restarted and creat has improved significantly. Serologies are pending. F/u labs in am. Cont IVF's. Abnormal LFT's - unremarkable RUQ [place].  W/u in progress.  Hyponatremia - likely due to AKI and volume depletion. Resolving.  AMS - improved, unclear etiology.  He likely has some effect with baclofen and gabapentin in setting of AKI.  Hold these meds and follow.  Consider EEG to r/o seizure given urinary incontinence. Rhabdomyolysis - CPK was 1100, not high enough to cause renal failure.      Vinson Moselle MD  [facility] 07/25/2023, 3:14 PM  Recent Labs  Lab 07/24/23 0031 07/24/23 1226 07/24/23 1947 07/25/23 0435  HGB 16.0  --   --  12.8*  ALBUMIN  --  4.7  --  4.1  CALCIUM  --  9.4 9.1 9.4  CREATININE 3.70* 2.78* 2.43* 2.20*  K 3.7 3.2* 3.4* 3.5   No results for input(s): "IRON", "TIBC", "FERRITIN" in the last 168 hours. Inpatient medications:  heparin  5,000 Units Subcutaneous Q8H    sodium chloride 100 mL/hr at 07/25/23 0959    albuterol, ondansetron **OR** ondansetron (ZOFRAN) IV, mouth rinse, oxyCODONE, traMADol

## 2023-07-25 NOTE — Procedures (Signed)
 Patient Name: Shane Klein  MRN: [id number]  Epilepsy Attending: Charlsie Quest  Referring Physician/Provider: Maryln Gottron, MD  Date: 07/25/2023 Duration: 21.48 mins  Patient history: 33yo M with ams. EEG to evaluate for seizure  Level of alertness: Awake  AEDs during EEG study: None  Technical aspects: This EEG study was done with scalp electrodes positioned according to the 10-20 International system of electrode placement. Electrical activity was reviewed with band pass filter of 1-70Hz , sensitivity of 7 uV/mm, display speed of 78mm/sec with a 60Hz  notched filter applied as appropriate. EEG data were recorded continuously and digitally stored.  Video monitoring was available and reviewed as appropriate.  Description: The posterior dominant rhythm consists of 9-10 Hz activity of moderate voltage (25-35 uV) seen predominantly in posterior head regions, symmetric and reactive to eye opening and eye closing. Physiologic photic driving was not seen during photic stimulation.  Hyperventilation was not performed.     IMPRESSION: This study is within normal limits. No seizures or epileptiform discharges were seen throughout the recording.  A normal interictal EEG does not exclude the diagnosis of epilepsy.  Jassmin Kemmerer Annabelle Harman

## 2023-07-25 NOTE — Progress Notes (Signed)
 EEG complete - results pending
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
## 2023-07-26 DIAGNOSIS — R7401 Elevation of levels of liver transaminase levels: Secondary | ICD-10-CM | POA: Diagnosis not present

## 2023-07-26 DIAGNOSIS — N170 Acute kidney failure with tubular necrosis: Secondary | ICD-10-CM | POA: Diagnosis not present

## 2023-07-26 LAB — CBC
HCT: 34.5 % — ABNORMAL LOW (ref 39.0–52.0)
Hemoglobin: 11.5 g/dL — ABNORMAL LOW (ref 13.0–17.0)
MCH: 28.2 pg (ref 26.0–34.0)
MCHC: 33.3 g/dL (ref 30.0–36.0)
MCV: 84.6 fL (ref 80.0–100.0)
Platelets: 251 10*3/uL (ref 150–400)
RBC: 4.08 MIL/uL — ABNORMAL LOW (ref 4.22–5.81)
RDW: 15.2 % (ref 11.5–15.5)
WBC: 7.4 10*3/uL (ref 4.0–10.5)
nRBC: 0 % (ref 0.0–0.2)

## 2023-07-26 LAB — COMPREHENSIVE METABOLIC PANEL WITH GFR
ALT: 212 U/L — ABNORMAL HIGH (ref 0–44)
AST: 62 U/L — ABNORMAL HIGH (ref 15–41)
Albumin: 3.4 g/dL — ABNORMAL LOW (ref 3.5–5.0)
Alkaline Phosphatase: 126 U/L (ref 38–126)
Anion gap: 7 (ref 5–15)
BUN: 42 mg/dL — ABNORMAL HIGH (ref 6–20)
CO2: 25 mmol/L (ref 22–32)
Calcium: 9 mg/dL (ref 8.9–10.3)
Chloride: 102 mmol/L (ref 98–111)
Creatinine, Ser: 1.66 mg/dL — ABNORMAL HIGH (ref 0.61–1.24)
GFR, Estimated: 56 mL/min — ABNORMAL LOW (ref >60)
Glucose, Bld: 93 mg/dL (ref 70–99)
Potassium: 3.6 mmol/L (ref 3.5–5.1)
Sodium: 134 mmol/L — ABNORMAL LOW (ref 135–145)
Total Bilirubin: 0.6 mg/dL (ref 0.0–1.2)
Total Protein: 7.3 g/dL (ref 6.5–8.1)

## 2023-07-26 LAB — EXTRACTABLE NUCLEAR ANTIGEN ANTIBODY
ENA SM Ab Ser-aCnc: <0.2 AI (ref 0.0–0.9)
Ribonucleic Protein: 0.3 AI (ref 0.0–0.9)
SSA (Ro) (ENA) Antibody, IgG: <0.2 AI (ref 0.0–0.9)
SSB (La) (ENA) Antibody, IgG: <0.2 AI (ref 0.0–0.9)
Scleroderma (Scl-70) (ENA) Antibody, IgG: <0.2 AI (ref 0.0–0.9)
ds DNA Ab: 1 [IU]/mL (ref 0–9)

## 2023-07-26 LAB — COMPLEMENT, TOTAL: Compl, Total (CH50): >60 U/mL (ref >41)

## 2023-07-26 LAB — ANA W/REFLEX IF POSITIVE: Anti Nuclear Antibody (ANA): NEGATIVE

## 2023-07-26 LAB — CK: Total CK: 273 U/L (ref 49–397)

## 2023-07-26 LAB — C3 COMPLEMENT: C3 Complement: 201 mg/dL — ABNORMAL HIGH (ref 82–167)

## 2023-07-26 LAB — C4 COMPLEMENT: Complement C4, Body Fluid: 40 mg/dL — ABNORMAL HIGH (ref 12–38)

## 2023-07-26 LAB — ANTI-JO 1 ANTIBODY, IGG: Anti JO-1: <0.2 AI (ref 0.0–0.9)

## 2023-07-26 MED ORDER — TIZANIDINE HCL 4 MG PO TABS
2.0000 mg | ORAL_TABLET | Freq: Once | ORAL | Status: AC
  Administered 2023-07-26: 2 mg via ORAL
  Filled 2023-07-26: qty 1

## 2023-07-26 MED ORDER — OXYCODONE HCL 5 MG PO TABS
5.0000 mg | ORAL_TABLET | Freq: Once | ORAL | Status: AC
  Administered 2023-07-26: 5 mg via ORAL
  Filled 2023-07-26: qty 1

## 2023-07-26 MED ORDER — MELATONIN 5 MG PO TABS
5.0000 mg | ORAL_TABLET | Freq: Once | ORAL | Status: AC
  Administered 2023-07-26: 5 mg via ORAL
  Filled 2023-07-26: qty 1

## 2023-07-26 NOTE — Progress Notes (Signed)
 [facility] Progress Note  Subjective:  Seen in room, no c/o's today Creat continues to improve, down to 1.6 today  Vitals:   07/25/23 1306 07/25/23 1951 07/26/23 0432 07/26/23 1143  BP: (!) 146/82 (!) 157/78 128/78 123/79  Pulse: 65 78 78 68  Resp:  20 15 17   Temp: 98.4 F (36.9 C) 98.5 F (36.9 C) (!) 97.4 F (36.3 C) 98.2 F (36.8 C)  TempSrc: Oral Oral Oral Oral  SpO2: 100% 100% 100% 100%  Weight:      Height:        Exam: General appearance: no distress Resp: clear to auscultation bilaterally Cardio: regular rate and rhythm GI: ileostomy in place, multiple scars across abdomen, +BS, nontender Extremities: extremities normal, atraumatic, no cyanosis or edema Neuro: nonfocal, responsive, appropriate      Assessment/ Plan: AKI - b/l creatinine was 0.59 from, July 2024. UNa here was <10 consistent with pre-renal vs acute GN.  He probably became volume depleted due to high output from his ileostomy. He also had blood and protein on his UA, which could be a GN.  IVF's were restarted and creat has improved significantly down to 1.6 today. Will f/u results of serologies. Otherwise, no further suggestions. Will sign off.  Hyponatremia - likely due to AKI and volume depletion. Resolved.  AMS - improved, unclear etiology.  He likely has some effect with baclofen and gabapentin in setting of AKI.  Hold these meds and follow.  Consider EEG to r/o seizure given urinary incontinence. Rhabdomyolysis - CPK was 1100, not high enough to cause renal failure.      Vinson Moselle MD  [facility] 07/26/2023, 2:45 PM  Recent Labs  Lab 07/25/23 0435 07/26/23 0502  HGB 12.8* 11.5*  ALBUMIN 4.1 3.4*  CALCIUM 9.4 9.0  CREATININE 2.20* 1.66*  K 3.5 3.6   No results for input(s): "IRON", "TIBC", "FERRITIN" in the last 168 hours. Inpatient medications:  heparin  5,000 Units Subcutaneous Q8H    sodium chloride 100 mL/hr at 07/26/23 1033   albuterol, ondansetron **OR** ondansetron  (ZOFRAN) IV, mouth rinse, oxyCODONE, traMADol
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
## 2023-07-27 ENCOUNTER — Ambulatory Visit: Admitting: Physical Therapy

## 2023-07-27 ENCOUNTER — Other Ambulatory Visit (HOSPITAL_COMMUNITY): Payer: Self-pay

## 2023-07-27 DIAGNOSIS — N170 Acute kidney failure with tubular necrosis: Secondary | ICD-10-CM | POA: Diagnosis not present

## 2023-07-27 LAB — COMPREHENSIVE METABOLIC PANEL WITH GFR
ALT: 157 U/L — ABNORMAL HIGH (ref 0–44)
AST: 43 U/L — ABNORMAL HIGH (ref 15–41)
Albumin: 3.3 g/dL — ABNORMAL LOW (ref 3.5–5.0)
Alkaline Phosphatase: 124 U/L (ref 38–126)
Anion gap: 6 (ref 5–15)
BUN: 29 mg/dL — ABNORMAL HIGH (ref 6–20)
CO2: 24 mmol/L (ref 22–32)
Calcium: 9.4 mg/dL (ref 8.9–10.3)
Chloride: 105 mmol/L (ref 98–111)
Creatinine, Ser: 1.67 mg/dL — ABNORMAL HIGH (ref 0.61–1.24)
GFR, Estimated: 55 mL/min — ABNORMAL LOW (ref >60)
Glucose, Bld: 93 mg/dL (ref 70–99)
Potassium: 3.8 mmol/L (ref 3.5–5.1)
Sodium: 135 mmol/L (ref 135–145)
Total Bilirubin: 0.6 mg/dL (ref 0.0–1.2)
Total Protein: 7.3 g/dL (ref 6.5–8.1)

## 2023-07-27 LAB — CBC
HCT: 36.2 % — ABNORMAL LOW (ref 39.0–52.0)
Hemoglobin: 11.8 g/dL — ABNORMAL LOW (ref 13.0–17.0)
MCH: 28 pg (ref 26.0–34.0)
MCHC: 32.6 g/dL (ref 30.0–36.0)
MCV: 86 fL (ref 80.0–100.0)
Platelets: 254 10*3/uL (ref 150–400)
RBC: 4.21 MIL/uL — ABNORMAL LOW (ref 4.22–5.81)
RDW: 15.3 % (ref 11.5–15.5)
WBC: 5.5 10*3/uL (ref 4.0–10.5)
nRBC: 0 % (ref 0.0–0.2)

## 2023-07-27 LAB — ANCA TITERS
Atypical P-ANCA titer: <1:20 {titer}
C-ANCA: <1:20 {titer}
P-ANCA: <1:20 {titer}

## 2023-07-27 MED ORDER — GABAPENTIN 400 MG PO CAPS
400.0000 mg | ORAL_CAPSULE | Freq: Three times a day (TID) | ORAL | 0 refills | Status: DC
  Filled 2023-07-27: qty 20, 7d supply, fill #0

## 2023-07-27 MED ORDER — BACLOFEN 15 MG PO TABS
15.0000 mg | ORAL_TABLET | Freq: Three times a day (TID) | ORAL | 0 refills | Status: DC | PRN
  Filled 2023-07-27: qty 20, fill #0

## 2023-07-27 MED ORDER — GABAPENTIN 300 MG PO CAPS
300.0000 mg | ORAL_CAPSULE | Freq: Three times a day (TID) | ORAL | Status: DC
  Administered 2023-07-27: 300 mg via ORAL
  Filled 2023-07-27: qty 1

## 2023-07-27 MED ORDER — BACLOFEN 10 MG PO TABS
10.0000 mg | ORAL_TABLET | Freq: Three times a day (TID) | ORAL | Status: DC | PRN
  Administered 2023-07-27: 10 mg via ORAL
  Filled 2023-07-27: qty 1

## 2023-07-27 MED ORDER — BACLOFEN 15 MG PO TABS
30.0000 mg | ORAL_TABLET | Freq: Three times a day (TID) | ORAL | 0 refills | Status: DC | PRN
  Filled 2023-07-27: qty 20, fill #0
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
## 2023-07-27 NOTE — Plan of Care (Signed)

## 2023-07-27 NOTE — Plan of Care (Signed)
  Problem: Education: Goal: Knowledge of General Education information will improve Description: Including pain rating scale, medication(s)/side effects and non-pharmacologic comfort measures 07/27/2023 1151 by Westley Foots, RN Outcome: Adequate for Discharge 07/27/2023 [phone] by Westley Foots, RN Outcome: Progressing   Problem: Health Behavior/Discharge Planning: Goal: Ability to manage health-related needs will improve 07/27/2023 1151 by Westley Foots, RN Outcome: Adequate for Discharge 07/27/2023 [phone] by Westley Foots, RN Outcome: Progressing   Problem: Clinical Measurements: Goal: Ability to maintain clinical measurements within normal limits will improve 07/27/2023 1151 by Westley Foots, RN Outcome: Adequate for Discharge 07/27/2023 [phone] by Westley Foots, RN Outcome: Progressing Goal: Will remain free from infection 07/27/2023 1151 by Westley Foots, RN Outcome: Adequate for Discharge 07/27/2023 [phone] by Westley Foots, RN Outcome: Progressing Goal: Diagnostic test results will improve 07/27/2023 1151 by Westley Foots, RN Outcome: Adequate for Discharge 07/27/2023 [phone] by Westley Foots, RN Outcome: Progressing Goal: Respiratory complications will improve 07/27/2023 1151 by Westley Foots, RN Outcome: Adequate for Discharge 07/27/2023 [phone] by Westley Foots, RN Outcome: Progressing Goal: Cardiovascular complication will be avoided 07/27/2023 1151 by Westley Foots, RN Outcome: Adequate for Discharge 07/27/2023 [phone] by Westley Foots, RN Outcome: Progressing   Problem: Activity: Goal: Risk for activity intolerance will decrease 07/27/2023 1151 by Westley Foots, RN Outcome: Adequate for Discharge 07/27/2023 [phone] by Westley Foots, RN Outcome: Progressing   Problem: Nutrition: Goal: Adequate nutrition will be maintained 07/27/2023 1151 by Westley Foots, RN Outcome: Adequate for  Discharge 07/27/2023 [phone] by Westley Foots, RN Outcome: Progressing   Problem: Coping: Goal: Level of anxiety will decrease 07/27/2023 1151 by Westley Foots, RN Outcome: Adequate for Discharge 07/27/2023 [phone] by Westley Foots, RN Outcome: Progressing   Problem: Elimination: Goal: Will not experience complications related to bowel motility 07/27/2023 1151 by Westley Foots, RN Outcome: Adequate for Discharge 07/27/2023 [phone] by Westley Foots, RN Outcome: Progressing Goal: Will not experience complications related to urinary retention 07/27/2023 1151 by Westley Foots, RN Outcome: Adequate for Discharge 07/27/2023 [phone] by Westley Foots, RN Outcome: Progressing   Problem: Pain Managment: Goal: General experience of comfort will improve and/or be controlled 07/27/2023 1151 by Westley Foots, RN Outcome: Adequate for Discharge 07/27/2023 0102 by Westley Foots, RN Outcome: Progressing   Problem: Safety: Goal: Ability to remain free from injury will improve 07/27/2023 1151 by Westley Foots, RN Outcome: Adequate for Discharge 07/27/[phone] by Westley Foots, RN Outcome: Progressing   Problem: Skin Integrity: Goal: Risk for impaired skin integrity will decrease 07/27/2023 1151 by Westley Foots, RN Outcome: Adequate for Discharge 07/27/2023 [phone] by Westley Foots, RN Outcome: Progressing

## 2023-07-27 NOTE — Discharge Summary (Addendum)
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  how much to take when to take this   gabapentin 400 MG capsule Commonly known as: NEURONTIN Take 1 capsule (400 mg total) by mouth 3 (three) times daily. What changed: how much to take   hydrOXYzine 25 MG tablet Commonly known as: ATARAX Take 25 mg by mouth at bedtime as needed for anxiety.   magnesium oxide 400 (240 Mg) MG tablet Commonly known as: MAG-OX Take 2 tablets (800 mg total) by mouth 2 (two)  times daily. Need to get from PCP in future- will not refill again   sertraline 25 MG tablet Commonly known as: ZOLOFT Take 25 mg by mouth in the morning.        Follow-up Information     [name], NP. Schedule an appointment as soon as possible for a visit in 1 week(s).   Specialty: Internal Medicine Contact information: [address] [place] [address] [phone]                Allergies  Allergen Reactions   Other Diarrhea, Nausea And Vomiting and Other (See Comments)    Seafood-patient does not know what reaction he has  Potato starch - abdominal pain, too   Gluten Meal Other (See Comments)    Stomach pain   Lactose Intolerance (Gi) Other (See Comments)    Pt advised RN he is lactose intolerant   Soybean Oil Diarrhea, Nausea And Vomiting and Other (See Comments)    Abdominal pain, too    Consultations:    Procedures/Studies: EEG adult Result Date: 07/25/2023 Charlsie Quest, MD     07/25/2023  8:59 PM Patient Name: Shane Klein MRN: [id number] Epilepsy Attending: Charlsie Quest Referring Physician/Provider: Maryln Gottron, MD Date: 07/25/2023 Duration: 21.48 mins Patient history: 33yo M with ams. EEG to evaluate for seizure Level of alertness: Awake AEDs during EEG study: None Technical aspects: This EEG study was done with scalp electrodes positioned according to the 10-20 International system of electrode placement. Electrical activity was reviewed with band pass filter of 1-70Hz , sensitivity of 7 uV/mm, display speed of 79mm/sec with a 60Hz  notched filter applied as appropriate. EEG data were recorded continuously and digitally stored.  Video monitoring was available and reviewed as appropriate. Description: The posterior dominant rhythm consists of 9-10 Hz activity of moderate voltage (25-35 uV) seen predominantly in posterior head regions, symmetric and reactive to eye opening and eye closing. Physiologic photic driving was not seen  during photic stimulation.  Hyperventilation was not performed.   IMPRESSION: This study is within normal limits. No seizures or epileptiform discharges were seen throughout the recording. A normal interictal EEG does not exclude the diagnosis of epilepsy. Charlsie Quest   CT ABDOMEN PELVIS WO CONTRAST Result Date: 07/24/2023 CLINICAL DATA:  Abdominal pain, acute, nonlocalized EXAM: CT ABDOMEN AND PELVIS WITHOUT CONTRAST TECHNIQUE: Multidetector CT imaging of the abdomen and pelvis was performed following the standard protocol without IV contrast. RADIATION DOSE REDUCTION: This exam was performed according to the departmental dose-optimization program which includes automated exposure control, adjustment of the mA and/or kV according to patient size and/or use of iterative reconstruction technique. COMPARISON:  None Available. FINDINGS: Lower chest: Retained metallic shrapnel noted at the left lung base. Mild bibasilar parenchymal scarring. No acute abnormality. Hepatobiliary: No focal liver abnormality is seen. No gallstones, gallbladder wall thickening, or biliary dilatation. Pancreas: Unremarkable Spleen: Unremarkable Adrenals/Urinary Tract: Adrenal glands are unremarkable. Kidneys are normal, without renal calculi, focal lesion, or hydronephrosis. Bladder is unremarkable. 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 The stomach, small bowel, and large bowel are otherwise unremarkable there is no evidence of obstruction or focal inflammation. No free intraperitoneal gas or fluid. Vascular/Lymphatic: There is shotty mesenteric adenopathy in the region of the ileocolic anastomosis within the right lower quadrant, possibly reactive or inflammatory in nature. No frankly pathologic adenopathy within the abdomen and pelvis. The abdominal vasculature is unremarkable on this noncontrast examination.  Reproductive: Prostate is unremarkable. Other: Diastasis of the rectus abdominus musculature with protrusion of the subjacent mesenteric fat. Musculoskeletal: No acute bone abnormality. Shrapnel is seen left gluteal region, retroperitoneum bilaterally, and within the spinal canal at L3. IMPRESSION: 1. No acute intra-abdominal pathology identified. No definite radiographic explanation for the patient's reported symptoms. 2. 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 No obstruction. 3. Shotty mesenteric adenopathy in the region of the ileocolic anastomosis within the right lower quadrant, possibly reactive or inflammatory in nature. 4. Retained metallic shrapnel at the left lung base, left gluteal region, retroperitoneum bilaterally, and within the spinal canal at L3. Electronically Signed   By: Helyn Numbers M.D.   On: 07/24/2023 02:43   US Abdomen Limited RUQ (LIVER/GB) Result Date: 07/24/2023 CLINICAL DATA:  Transaminitis EXAM: ULTRASOUND ABDOMEN LIMITED RIGHT UPPER QUADRANT COMPARISON:  None Available. FINDINGS: Gallbladder: Dependently layering sludge ball seen within the gallbladder. The gallbladder, however, is not distended, there is no gallbladder wall thickening, and no pericholecystic fluid is identified. The sonographic Eulah Pont sign is reportedly negative. Common bile duct: Diameter: 4 mm Liver: No focal lesion identified. Within normal limits in parenchymal echogenicity. Portal vein is patent on color Doppler imaging with normal direction of blood flow towards the liver. Other: None. IMPRESSION: 1. Gallbladder sludge. No sonographic evidence of acute cholecystitis. Electronically Signed   By: Helyn Numbers M.D.   On: 07/24/2023 02:03   CT HEAD WO CONTRAST Result Date: 07/24/2023 CLINICAL DATA:  Abnormal mental status, head trauma EXAM: CT HEAD WITHOUT CONTRAST TECHNIQUE: Contiguous axial images were obtained  from the base of the skull through the vertex without intravenous contrast. RADIATION DOSE REDUCTION: This exam was performed according to the departmental dose-optimization program which includes automated exposure control, adjustment of the mA and/or kV according to patient size and/or use of iterative reconstruction technique. COMPARISON:  CT head 08/01/2022 FINDINGS: Brain: No intracranial hemorrhage, mass effect, or evidence of acute infarct. No hydrocephalus. No extra-axial fluid collection. Vascular: No hyperdense vessel or unexpected calcification. Skull: No fracture or focal lesion. Sinuses/Orbits: No acute finding. Other: None. IMPRESSION: No acute intracranial abnormality. Electronically Signed   By: Minerva Fester M.D.   On: 07/24/2023  00:43      Subjective:   Discharge Exam: Vitals:   07/26/23 2040 07/27/23 0556  BP: (!) 140/79 136/86  Pulse: 73 62  Resp:  17  Temp: 98.6 F (37 C) 98.5 F (36.9 C)  SpO2: 100% 100%   Vitals:   07/26/23 0432 07/26/23 1143 07/26/23 2040 07/27/23 0556  BP: 128/78 123/79 (!) 140/79 136/86  Pulse: 78 68 73 62  Resp: 15 17  17   Temp: (!) 97.4 F (36.3 C) 98.2 F (36.8 C) 98.6 F (37 C) 98.5 F (36.9 C)  TempSrc: Oral Oral Oral Oral  SpO2: 100% 100% 100% 100%  Weight:      Height:        General: Pt is alert, awake, not in acute distress Cardiovascular: RRR, S1/S2 +, no rubs, no gallops Respiratory: CTA bilaterally, no wheezing, no rhonchi Abdominal: Soft, NT, ND, bowel sounds + Extremities: no edema, no cyanosis    The results of significant diagnostics from this hospitalization (including imaging, microbiology, ancillary and laboratory) are listed below for reference.     Microbiology: Recent Results (from the past 240 hours)  Culture, blood (Routine X 2) w Reflex to ID Panel     Status: None (Preliminary result)   Collection Time: 07/24/23 10:01 AM   Specimen: BLOOD RIGHT ARM  Result Value Ref Range Status   Specimen  Description   Final    BLOOD RIGHT ARM Performed at [facility], [address], [place], [place] [address]    Special Requests   Final    BOTTLES DRAWN AEROBIC ONLY Blood Culture results may not be optimal due to an inadequate volume of blood received in culture bottles Performed at [facility], [address], [place], [place] [address]    Culture   Final    NO GROWTH 3 DAYS Performed at [facility], [address] [place], [place] [address]    Report Status PENDING  Incomplete  Culture, blood (Routine X 2) w Reflex to ID Panel     Status: None (Preliminary result)   Collection Time: 07/24/23 10:11 AM   Specimen: BLOOD RIGHT HAND  Result Value Ref Range Status   Specimen Description   Final    BLOOD RIGHT HAND Performed at [facility], [address], [place], [place] [address]    Special Requests   Final    BOTTLES DRAWN AEROBIC ONLY Blood Culture results may not be optimal due to an inadequate volume of blood received in culture bottles Performed at [facility], [address] [place], [place] [address]    Culture   Final    NO GROWTH 3 DAYS Performed at [facility], [address], [place], [place] [address]    Report Status PENDING  Incomplete  MRSA Next Gen by PCR, Nasal     Status: None   Collection Time: 07/24/23  3:23 PM   Specimen: Nasal Mucosa; Nasal Swab  Result Value Ref Range Status   MRSA by PCR Next Gen NOT DETECTED NOT DETECTED Final    Comment: (NOTE) The GeneXpert MRSA Assay (FDA approved for NASAL specimens only), is one component of a comprehensive MRSA colonization surveillance program. It is not intended to diagnose MRSA infection nor to guide or monitor treatment for MRSA infections. Test performance is not FDA approved in patients less than 39 years old. Performed at [facility], [address] [place], [place] [address]  Labs: BNP (last 3  results) No results for input(s): "BNP" in the last 8760 hours. Basic Metabolic Panel: Recent Labs  Lab 07/24/23 1226 07/24/23 1947 07/25/23 0435 07/26/23 0502 07/27/23 0507  NA 131* 132* 133* 134* 135  K 3.2* 3.4* 3.5 3.6 3.8  CL 89* 93* 94* 102 105  CO2 28 26 27 25 24   GLUCOSE 88 79 94 93 93  BUN 68* 63* 56* 42* 29*  CREATININE 2.78* 2.43* 2.20* 1.66* 1.67*  CALCIUM 9.4 9.1 9.4 9.0 9.4   Liver Function Tests: Recent Labs  Lab 07/24/23 0017 07/24/23 1226 07/25/23 0435 07/26/23 0502 07/27/23 0507  AST 220* 190* 123* 62* 43*  ALT 557* 460* 353* 212* 157*  ALKPHOS 189* 187* 166* 126 124  BILITOT 1.5* 1.6* 1.4* 0.6 0.6  PROT 10.3* 9.5* 8.6* 7.3 7.3  ALBUMIN 5.4* 4.7 4.1 3.4* 3.3*   No results for input(s): "LIPASE", "AMYLASE" in the last 168 hours. Recent Labs  Lab 07/24/23 0149  AMMONIA 26   CBC: Recent Labs  Lab 07/24/23 0017 07/24/23 0031 07/25/23 0435 07/26/23 0502 07/27/23 0507  WBC 7.1  --  6.7 7.4 5.5  NEUTROABS 5.0  --   --   --   --   HGB 14.9 16.0 12.8* 11.5* 11.8*  HCT 43.0 47.0 39.6 34.5* 36.2*  MCV 81.6  --  86.1 84.6 86.0  PLT 328  --  284 251 254   Cardiac Enzymes: Recent Labs  Lab 07/24/23 0528 07/26/23 0502  CKTOTAL 1,101* 273   BNP: Invalid input(s): "POCBNP" CBG: Recent Labs  Lab 07/24/23 0016  GLUCAP 127*   D-Dimer No results for input(s): "DDIMER" in the last 72 hours. Hgb A1c No results for input(s): "HGBA1C" in the last 72 hours. Lipid Profile No results for input(s): "CHOL", "HDL", "LDLCALC", "TRIG", "CHOLHDL", "LDLDIRECT" in the last 72 hours. Thyroid function studies No results for input(s): "TSH", "T4TOTAL", "T3FREE", "THYROIDAB" in the last 72 hours.  Invalid input(s): "FREET3" Anemia work up No results for input(s): "VITAMINB12", "FOLATE", "FERRITIN", "TIBC", "IRON", "RETICCTPCT" in the last 72 hours. Urinalysis    Component Value Date/Time   COLORURINE YELLOW 07/24/2023 0337   APPEARANCEUR CLEAR  07/24/2023 0337   LABSPEC 1.011 07/24/2023 0337   PHURINE 5.0 07/24/2023 0337   GLUCOSEU NEGATIVE 07/24/2023 0337   HGBUR MODERATE (A) 07/24/2023 0337   BILIRUBINUR NEGATIVE 07/24/2023 0337   KETONESUR NEGATIVE 07/24/2023 0337   PROTEINUR 100 (A) 07/24/2023 0337   UROBILINOGEN 0.2 11/08/2014 2150   NITRITE NEGATIVE 07/24/2023 0337   LEUKOCYTESUR NEGATIVE 07/24/2023 0337   Sepsis Labs Recent Labs  Lab 07/24/23 0017 07/25/23 0435 07/26/23 0502 07/27/23 0507  WBC 7.1 6.7 7.4 5.5   Microbiology Recent Results (from the past 240 hours)  Culture, blood (Routine X 2) w Reflex to ID Panel     Status: None (Preliminary result)   Collection Time: 07/24/23 10:01 AM   Specimen: BLOOD RIGHT ARM  Result Value Ref Range Status   Specimen Description   Final    BLOOD RIGHT ARM Performed at [facility], [address], [place], [place] [address]    Special Requests   Final    BOTTLES DRAWN AEROBIC ONLY Blood Culture results may not be optimal due to an inadequate volume of blood received in culture bottles Performed at [facility] [address] [place], [place] [address]    Culture   Final    NO GROWTH 3 DAYS Performed at [facility], [address] [place], [place] [address]    Report Status PENDING  Incomplete  Culture, blood (Routine X 2) w Reflex to ID Panel     Status: None (Preliminary result)   Collection Time: 07/24/23 10:11 AM   Specimen: BLOOD RIGHT HAND  Result Value Ref Range Status   Specimen Description   Final    BLOOD RIGHT HAND Performed at [facility], [address] [place], [place] [address]    Special Requests   Final    BOTTLES DRAWN AEROBIC ONLY Blood Culture results may not be optimal due to an inadequate volume of blood received in culture bottles Performed at [facility], [address], [place], [place] [address]    Culture   Final    NO GROWTH 3 DAYS Performed at [facility],  [address], [place], [place] [address]    Report Status PENDING  Incomplete  MRSA Next Gen by PCR, Nasal     Status: None   Collection Time: 07/24/23  3:23 PM   Specimen: Nasal Mucosa; Nasal Swab  Result Value Ref Range Status   MRSA by PCR Next Gen NOT DETECTED NOT DETECTED Final    Comment: (NOTE) The GeneXpert MRSA Assay (FDA approved for NASAL specimens only), is one component of a comprehensive MRSA colonization surveillance program. It is not intended to diagnose MRSA infection nor to guide or monitor treatment for MRSA infections. Test performance is not FDA approved in patients less than 16 years old. Performed at [facility], [address], [place], [place] [address]     Please note: You were cared for by a hospitalist during your hospital stay. Once you are discharged, your primary care physician will handle any further medical issues. Please note that NO REFILLS for any discharge medications will be authorized once you are discharged, as it is imperative that you return to your primary care physician (or establish a relationship with a primary care physician if you do not have one) for your post hospital discharge needs so that they can reassess your need for medications and monitor your lab values.    Time coordinating discharge: 40 minutes  SIGNED:   Burnadette Pop, MD  [facility] 07/27/2023, 11:15 AM Pager [phone]  If 7PM-7AM, please contact night-coverage [email or web address] Password [email or web address]

## 2023-07-28 ENCOUNTER — Telehealth (INDEPENDENT_AMBULATORY_CARE_PROVIDER_SITE_OTHER): Payer: Self-pay

## 2023-07-28 ENCOUNTER — Other Ambulatory Visit (HOSPITAL_COMMUNITY): Payer: Self-pay

## 2023-07-28 NOTE — Transitions of Care (Post Inpatient/ED Visit) (Signed)
   07/28/2023  Name: TZVI ECONOMOU MRN: [id number] DOB: 03/17/1991  Today's TOC FU Call Status: Today's TOC FU Call Status:: Unsuccessful Call (1st Attempt) Unsuccessful Call (1st Attempt) Date: 07/28/23  Attempted to reach the patient regarding the most recent Inpatient/ED visit.  Follow Up Plan: Additional outreach attempts will be made to reach the patient to complete the Transitions of Care (Post Inpatient/ED visit) call.   Signature Karena Addison, LPN [facility] Nurse Health Advisor Direct Dial [phone]

## 2023-07-29 ENCOUNTER — Encounter
Payer: Medicaid Other | Attending: Physical Medicine and Rehabilitation | Admitting: Physical Medicine and Rehabilitation

## 2023-07-29 DIAGNOSIS — N319 Neuromuscular dysfunction of bladder, unspecified: Secondary | ICD-10-CM | POA: Insufficient documentation

## 2023-07-29 DIAGNOSIS — G5631 Lesion of radial nerve, right upper limb: Secondary | ICD-10-CM | POA: Insufficient documentation

## 2023-07-29 DIAGNOSIS — R198 Other specified symptoms and signs involving the digestive system and abdomen: Secondary | ICD-10-CM | POA: Insufficient documentation

## 2023-07-29 DIAGNOSIS — Z932 Ileostomy status: Secondary | ICD-10-CM | POA: Insufficient documentation

## 2023-07-29 DIAGNOSIS — F4310 Post-traumatic stress disorder, unspecified: Secondary | ICD-10-CM | POA: Insufficient documentation

## 2023-07-29 DIAGNOSIS — N529 Male erectile dysfunction, unspecified: Secondary | ICD-10-CM | POA: Insufficient documentation

## 2023-07-29 DIAGNOSIS — Z9049 Acquired absence of other specified parts of digestive tract: Secondary | ICD-10-CM | POA: Insufficient documentation

## 2023-07-29 DIAGNOSIS — K50912 Crohn's disease, unspecified, with intestinal obstruction: Secondary | ICD-10-CM | POA: Insufficient documentation

## 2023-07-29 DIAGNOSIS — Z79899 Other long term (current) drug therapy: Secondary | ICD-10-CM | POA: Insufficient documentation

## 2023-07-29 DIAGNOSIS — Z760 Encounter for issue of repeat prescription: Secondary | ICD-10-CM | POA: Insufficient documentation

## 2023-07-29 DIAGNOSIS — G8222 Paraplegia, incomplete: Secondary | ICD-10-CM | POA: Insufficient documentation

## 2023-07-29 DIAGNOSIS — N521 Erectile dysfunction due to diseases classified elsewhere: Secondary | ICD-10-CM | POA: Insufficient documentation

## 2023-07-29 LAB — CULTURE, BLOOD (ROUTINE X 2)

## 2023-07-30 ENCOUNTER — Encounter: Payer: Self-pay | Admitting: Physical Medicine and Rehabilitation

## 2023-08-02 NOTE — Transitions of Care (Post Inpatient/ED Visit) (Unsigned)
   08/02/2023  Name: Shane Klein MRN: [id number] DOB: 06-10-1990  Today's TOC FU Call Status: Today's TOC FU Call Status:: Unsuccessful Call (2nd Attempt) Unsuccessful Call (1st Attempt) Date: 07/28/23 Unsuccessful Call (2nd Attempt) Date: 08/02/23  Attempted to reach the patient regarding the most recent Inpatient/ED visit.  Follow Up Plan: Additional outreach attempts will be made to reach the patient to complete the Transitions of Care (Post Inpatient/ED visit) call.   Signature Karena Addison, LPN [facility] Nurse Health Advisor Direct Dial [phone]

## 2023-08-03 ENCOUNTER — Inpatient Hospital Stay (HOSPITAL_COMMUNITY)

## 2023-08-03 ENCOUNTER — Inpatient Hospital Stay (HOSPITAL_COMMUNITY)
Admission: EM | Admit: 2023-08-03 | Discharge: 2023-08-10 | DRG: 091 | Disposition: A | Discharge status: Home / Self Care | Attending: Internal Medicine | Admitting: Internal Medicine

## 2023-08-03 ENCOUNTER — Encounter: Admitting: Occupational Therapy

## 2023-08-03 ENCOUNTER — Encounter (HOSPITAL_COMMUNITY)

## 2023-08-03 ENCOUNTER — Other Ambulatory Visit (HOSPITAL_COMMUNITY): Payer: Self-pay

## 2023-08-03 ENCOUNTER — Ambulatory Visit: Admitting: Physical Therapy

## 2023-08-03 ENCOUNTER — Other Ambulatory Visit: Payer: Self-pay

## 2023-08-03 ENCOUNTER — Encounter (HOSPITAL_COMMUNITY): Payer: Self-pay

## 2023-08-03 DIAGNOSIS — K7200 Acute and subacute hepatic failure without coma: Secondary | ICD-10-CM | POA: Diagnosis present

## 2023-08-03 DIAGNOSIS — F32A Depression, unspecified: Secondary | ICD-10-CM | POA: Diagnosis present

## 2023-08-03 DIAGNOSIS — Z932 Ileostomy status: Secondary | ICD-10-CM | POA: Diagnosis not present

## 2023-08-03 DIAGNOSIS — Z781 Physical restraint status: Secondary | ICD-10-CM | POA: Diagnosis not present

## 2023-08-03 DIAGNOSIS — Z79899 Other long term (current) drug therapy: Secondary | ICD-10-CM

## 2023-08-03 DIAGNOSIS — E739 Lactose intolerance, unspecified: Secondary | ICD-10-CM | POA: Diagnosis present

## 2023-08-03 DIAGNOSIS — K5080 Crohn's disease of both small and large intestine without complications: Secondary | ICD-10-CM | POA: Diagnosis present

## 2023-08-03 DIAGNOSIS — Z87891 Personal history of nicotine dependence: Secondary | ICD-10-CM

## 2023-08-03 DIAGNOSIS — E8721 Acute metabolic acidosis: Secondary | ICD-10-CM | POA: Diagnosis not present

## 2023-08-03 DIAGNOSIS — R571 Hypovolemic shock: Secondary | ICD-10-CM | POA: Diagnosis present

## 2023-08-03 DIAGNOSIS — S0083XA Contusion of other part of head, initial encounter: Secondary | ICD-10-CM | POA: Diagnosis present

## 2023-08-03 DIAGNOSIS — E871 Hypo-osmolality and hyponatremia: Secondary | ICD-10-CM | POA: Diagnosis present

## 2023-08-03 DIAGNOSIS — E860 Dehydration: Secondary | ICD-10-CM | POA: Diagnosis present

## 2023-08-03 DIAGNOSIS — N179 Acute kidney failure, unspecified: Secondary | ICD-10-CM | POA: Diagnosis present

## 2023-08-03 DIAGNOSIS — Z91013 Allergy to seafood: Secondary | ICD-10-CM

## 2023-08-03 DIAGNOSIS — Z8249 Family history of ischemic heart disease and other diseases of the circulatory system: Secondary | ICD-10-CM

## 2023-08-03 DIAGNOSIS — G253 Myoclonus: Secondary | ICD-10-CM | POA: Diagnosis present

## 2023-08-03 DIAGNOSIS — S022XXA Fracture of nasal bones, initial encounter for closed fracture: Secondary | ICD-10-CM | POA: Diagnosis present

## 2023-08-03 DIAGNOSIS — Z933 Colostomy status: Secondary | ICD-10-CM | POA: Diagnosis not present

## 2023-08-03 DIAGNOSIS — Z7409 Other reduced mobility: Secondary | ICD-10-CM | POA: Diagnosis present

## 2023-08-03 DIAGNOSIS — X58XXXA Exposure to other specified factors, initial encounter: Secondary | ICD-10-CM | POA: Diagnosis present

## 2023-08-03 DIAGNOSIS — M6282 Rhabdomyolysis: Secondary | ICD-10-CM | POA: Diagnosis present

## 2023-08-03 DIAGNOSIS — R569 Unspecified convulsions: Secondary | ICD-10-CM | POA: Diagnosis not present

## 2023-08-03 DIAGNOSIS — R4182 Altered mental status, unspecified: Secondary | ICD-10-CM

## 2023-08-03 DIAGNOSIS — G629 Polyneuropathy, unspecified: Secondary | ICD-10-CM | POA: Diagnosis present

## 2023-08-03 DIAGNOSIS — T428X5A Adverse effect of antiparkinsonism drugs and other central muscle-tone depressants, initial encounter: Secondary | ICD-10-CM | POA: Diagnosis present

## 2023-08-03 DIAGNOSIS — R7401 Elevation of levels of liver transaminase levels: Secondary | ICD-10-CM | POA: Diagnosis not present

## 2023-08-03 DIAGNOSIS — S0993XA Unspecified injury of face, initial encounter: Secondary | ICD-10-CM

## 2023-08-03 DIAGNOSIS — R579 Shock, unspecified: Secondary | ICD-10-CM

## 2023-08-03 DIAGNOSIS — R000 Tachycardia, unspecified: Secondary | ICD-10-CM | POA: Diagnosis present

## 2023-08-03 DIAGNOSIS — S01112A Laceration without foreign body of left eyelid and periocular area, initial encounter: Secondary | ICD-10-CM | POA: Diagnosis present

## 2023-08-03 DIAGNOSIS — K219 Gastro-esophageal reflux disease without esophagitis: Secondary | ICD-10-CM | POA: Diagnosis present

## 2023-08-03 DIAGNOSIS — G8222 Paraplegia, incomplete: Secondary | ICD-10-CM | POA: Diagnosis present

## 2023-08-03 DIAGNOSIS — R0681 Apnea, not elsewhere classified: Secondary | ICD-10-CM | POA: Diagnosis not present

## 2023-08-03 DIAGNOSIS — E669 Obesity, unspecified: Secondary | ICD-10-CM | POA: Diagnosis present

## 2023-08-03 DIAGNOSIS — D72829 Elevated white blood cell count, unspecified: Secondary | ICD-10-CM

## 2023-08-03 DIAGNOSIS — R198 Other specified symptoms and signs involving the digestive system and abdomen: Secondary | ICD-10-CM | POA: Diagnosis not present

## 2023-08-03 DIAGNOSIS — F419 Anxiety disorder, unspecified: Secondary | ICD-10-CM | POA: Diagnosis present

## 2023-08-03 DIAGNOSIS — G9340 Encephalopathy, unspecified: Principal | ICD-10-CM | POA: Diagnosis present

## 2023-08-03 DIAGNOSIS — R030 Elevated blood-pressure reading, without diagnosis of hypertension: Secondary | ICD-10-CM | POA: Diagnosis present

## 2023-08-03 DIAGNOSIS — G928 Other toxic encephalopathy: Secondary | ICD-10-CM | POA: Diagnosis present

## 2023-08-03 DIAGNOSIS — L24B3 Irritant contact dermatitis related to fecal or urinary stoma or fistula: Secondary | ICD-10-CM | POA: Diagnosis present

## 2023-08-03 DIAGNOSIS — Z765 Malingerer [conscious simulation]: Secondary | ICD-10-CM

## 2023-08-03 DIAGNOSIS — E872 Acidosis, unspecified: Secondary | ICD-10-CM | POA: Diagnosis present

## 2023-08-03 DIAGNOSIS — T426X5A Adverse effect of other antiepileptic and sedative-hypnotic drugs, initial encounter: Secondary | ICD-10-CM | POA: Diagnosis present

## 2023-08-03 DIAGNOSIS — Z9152 Personal history of nonsuicidal self-harm: Secondary | ICD-10-CM

## 2023-08-03 DIAGNOSIS — Z6825 Body mass index (BMI) 25.0-25.9, adult: Secondary | ICD-10-CM

## 2023-08-03 DIAGNOSIS — S01111A Laceration without foreign body of right eyelid and periocular area, initial encounter: Secondary | ICD-10-CM | POA: Diagnosis present

## 2023-08-03 DIAGNOSIS — Z91018 Allergy to other foods: Secondary | ICD-10-CM

## 2023-08-03 DIAGNOSIS — M7989 Other specified soft tissue disorders: Secondary | ICD-10-CM | POA: Diagnosis not present

## 2023-08-03 LAB — URINALYSIS, ROUTINE W REFLEX MICROSCOPIC
Bilirubin Urine: NEGATIVE
Glucose, UA: NEGATIVE mg/dL
Ketones, ur: 5 mg/dL — AB
Nitrite: NEGATIVE
Protein, ur: 100 mg/dL — AB
Specific Gravity, Urine: 1.013 (ref 1.005–1.030)
pH: 5 (ref 5.0–8.0)

## 2023-08-03 LAB — CBC WITH DIFFERENTIAL/PLATELET
Abs Immature Granulocytes: 0 10*3/uL (ref 0.00–0.07)
Basophils Absolute: 0 10*3/uL (ref 0.0–0.1)
Basophils Relative: 0 %
Eosinophils Absolute: 0 10*3/uL (ref 0.0–0.5)
Eosinophils Relative: 0 %
HCT: 41.2 % (ref 39.0–52.0)
Hemoglobin: 14.4 g/dL (ref 13.0–17.0)
Lymphocytes Relative: 5 %
Lymphs Abs: 1.6 10*3/uL (ref 0.7–4.0)
MCH: 28 pg (ref 26.0–34.0)
MCHC: 35 g/dL (ref 30.0–36.0)
MCV: 80.2 fL (ref 80.0–100.0)
Monocytes Absolute: 1.6 10*3/uL — ABNORMAL HIGH (ref 0.1–1.0)
Monocytes Relative: 5 %
Neutro Abs: 28.5 10*3/uL — ABNORMAL HIGH (ref 1.7–7.7)
Neutrophils Relative %: 90 %
Platelets: 478 10*3/uL — ABNORMAL HIGH (ref 150–400)
RBC: 5.14 MIL/uL (ref 4.22–5.81)
RDW: 14.4 % (ref 11.5–15.5)
WBC: 31.7 10*3/uL — ABNORMAL HIGH (ref 4.0–10.5)
nRBC: 0 % (ref 0.0–0.2)
nRBC: 0 /100{WBCs}

## 2023-08-03 LAB — TSH: TSH: 1.231 u[IU]/mL (ref 0.350–4.500)

## 2023-08-03 LAB — POCT I-STAT 7, (LYTES, BLD GAS, ICA,H+H)
Acid-Base Excess: 4 mmol/L — ABNORMAL HIGH (ref 0.0–2.0)
Bicarbonate: 29.5 mmol/L — ABNORMAL HIGH (ref 20.0–28.0)
Calcium, Ion: 1.07 mmol/L — ABNORMAL LOW (ref 1.15–1.40)
HCT: 35 % — ABNORMAL LOW (ref 39.0–52.0)
Hemoglobin: 11.9 g/dL — ABNORMAL LOW (ref 13.0–17.0)
O2 Saturation: 100 %
Patient temperature: 98.6
Potassium: 3.9 mmol/L (ref 3.5–5.1)
Sodium: 138 mmol/L (ref 135–145)
TCO2: 31 mmol/L (ref 22–32)
pCO2 arterial: 47.8 mmHg (ref 32–48)
pH, Arterial: 7.399 (ref 7.35–7.45)
pO2, Arterial: 306 mmHg — ABNORMAL HIGH (ref 83–108)

## 2023-08-03 LAB — COMPREHENSIVE METABOLIC PANEL WITH GFR
ALT: 566 U/L — ABNORMAL HIGH (ref 0–44)
AST: 401 U/L — ABNORMAL HIGH (ref 15–41)
Albumin: 5.1 g/dL — ABNORMAL HIGH (ref 3.5–5.0)
Alkaline Phosphatase: 156 U/L — ABNORMAL HIGH (ref 38–126)
Anion gap: 31 — ABNORMAL HIGH (ref 5–15)
BUN: 111 mg/dL — ABNORMAL HIGH (ref 6–20)
CO2: 18 mmol/L — ABNORMAL LOW (ref 22–32)
Calcium: 10 mg/dL (ref 8.9–10.3)
Chloride: 87 mmol/L — ABNORMAL LOW (ref 98–111)
Creatinine, Ser: 7.76 mg/dL — ABNORMAL HIGH (ref 0.61–1.24)
GFR, Estimated: 9 mL/min — ABNORMAL LOW (ref >60)
Glucose, Bld: 96 mg/dL (ref 70–99)
Potassium: 4.1 mmol/L (ref 3.5–5.1)
Sodium: 136 mmol/L (ref 135–145)
Total Bilirubin: 3.5 mg/dL — ABNORMAL HIGH (ref 0.0–1.2)
Total Protein: 11.1 g/dL — ABNORMAL HIGH (ref 6.5–8.1)

## 2023-08-03 LAB — SALICYLATE LEVEL: Salicylate Lvl: <7 mg/dL — ABNORMAL LOW (ref 7.0–30.0)

## 2023-08-03 LAB — CBG MONITORING, ED
Glucose-Capillary: 129 mg/dL — ABNORMAL HIGH (ref 70–99)
Glucose-Capillary: 121 mg/dL — ABNORMAL HIGH (ref 70–99)

## 2023-08-03 LAB — RAPID URINE DRUG SCREEN, HOSP PERFORMED
Amphetamines: NOT DETECTED
Barbiturates: NOT DETECTED
Benzodiazepines: NOT DETECTED
Cocaine: NOT DETECTED
Opiates: NOT DETECTED
Tetrahydrocannabinol: POSITIVE — AB

## 2023-08-03 LAB — TROPONIN I (HIGH SENSITIVITY): Troponin I (High Sensitivity): 37 ng/L — ABNORMAL HIGH (ref <18)

## 2023-08-03 LAB — VITAMIN B12: Vitamin B-12: 1054 pg/mL — ABNORMAL HIGH (ref 180–914)

## 2023-08-03 LAB — ACETAMINOPHEN LEVEL: Acetaminophen (Tylenol), Serum: <10 ug/mL — ABNORMAL LOW (ref 10–30)

## 2023-08-03 LAB — ETHANOL: Alcohol, Ethyl (B): <10 mg/dL (ref <10)

## 2023-08-03 LAB — AMMONIA: Ammonia: 14 umol/L (ref 9–35)

## 2023-08-03 LAB — MRSA NEXT GEN BY PCR, NASAL: MRSA by PCR Next Gen: NOT DETECTED

## 2023-08-03 LAB — GLUCOSE, CAPILLARY
Glucose-Capillary: 100 mg/dL — ABNORMAL HIGH (ref 70–99)
Glucose-Capillary: 144 mg/dL — ABNORMAL HIGH (ref 70–99)

## 2023-08-03 LAB — FOLATE: Folate: 21.4 ng/mL (ref >5.9)

## 2023-08-03 LAB — CK: Total CK: 3647 U/L — ABNORMAL HIGH (ref 49–397)

## 2023-08-03 LAB — LACTIC ACID, PLASMA: Lactic Acid, Venous: 3.9 mmol/L (ref 0.5–1.9)

## 2023-08-03 MED ORDER — THIAMINE HCL 100 MG/ML IJ SOLN
500.0000 mg | Freq: Three times a day (TID) | INTRAVENOUS | Status: AC
  Administered 2023-08-03 – 2023-08-05 (×6): 500 mg via INTRAVENOUS
  Filled 2023-08-03 (×6): qty 5

## 2023-08-03 MED ORDER — KETAMINE HCL 50 MG/5ML IJ SOSY
PREFILLED_SYRINGE | INTRAMUSCULAR | Status: AC
  Filled 2023-08-03: qty 10

## 2023-08-03 MED ORDER — SODIUM CHLORIDE 0.9 % IV BOLUS
1000.0000 mL | Freq: Once | INTRAVENOUS | Status: AC
  Administered 2023-08-03: 1000 mL via INTRAVENOUS

## 2023-08-03 MED ORDER — SUCCINYLCHOLINE CHLORIDE 200 MG/10ML IV SOSY
PREFILLED_SYRINGE | INTRAVENOUS | Status: AC
  Filled 2023-08-03: qty 10

## 2023-08-03 MED ORDER — MUPIROCIN 2 % EX OINT
TOPICAL_OINTMENT | Freq: Every day | CUTANEOUS | Status: DC
Start: 2023-08-03 — End: 2023-08-10
  Filled 2023-08-03: qty 44

## 2023-08-03 MED ORDER — SODIUM BICARBONATE 8.4 % IV SOLN
100.0000 meq | Freq: Once | INTRAVENOUS | Status: AC
  Administered 2023-08-03: 100 meq via INTRAVENOUS
  Filled 2023-08-03: qty 50

## 2023-08-03 MED ORDER — FENTANYL CITRATE PF 50 MCG/ML IJ SOSY
PREFILLED_SYRINGE | INTRAMUSCULAR | Status: AC
  Filled 2023-08-03: qty 2

## 2023-08-03 MED ORDER — ZIPRASIDONE MESYLATE 20 MG IM SOLR
20.0000 mg | Freq: Once | INTRAMUSCULAR | Status: AC
  Administered 2023-08-03: 20 mg via INTRAMUSCULAR
  Filled 2023-08-03: qty 20

## 2023-08-03 MED ORDER — PIPERACILLIN-TAZOBACTAM IN DEX 2-0.25 GM/50ML IV SOLN
2.2500 g | Freq: Three times a day (TID) | INTRAVENOUS | Status: DC
Start: 2023-08-03 — End: 2023-08-03
  Filled 2023-08-03 (×2): qty 50

## 2023-08-03 MED ORDER — HALOPERIDOL LACTATE 5 MG/ML IJ SOLN
INTRAMUSCULAR | Status: AC
  Filled 2023-08-03: qty 2

## 2023-08-03 MED ORDER — NOREPINEPHRINE 4 MG/250ML-% IV SOLN
0.0000 ug/min | INTRAVENOUS | Status: DC
Start: 2023-08-03 — End: 2023-08-04
  Administered 2023-08-03: 20 ug/min via INTRAVENOUS
  Filled 2023-08-03: qty 250

## 2023-08-03 MED ORDER — NOREPINEPHRINE 4 MG/250ML-% IV SOLN
INTRAVENOUS | Status: AC
  Administered 2023-08-03: 0.5 ug/kg/min
  Filled 2023-08-03: qty 250

## 2023-08-03 MED ORDER — ETOMIDATE 2 MG/ML IV SOLN
INTRAVENOUS | Status: AC
  Filled 2023-08-03: qty 20

## 2023-08-03 MED ORDER — MAGNESIUM SULFATE 2 GM/50ML IV SOLN
2.0000 g | Freq: Once | INTRAVENOUS | Status: AC
  Administered 2023-08-03: 2 g via INTRAVENOUS
  Filled 2023-08-03: qty 50

## 2023-08-03 MED ORDER — CHLORHEXIDINE GLUCONATE CLOTH 2 % EX PADS
6.0000 | MEDICATED_PAD | Freq: Every day | CUTANEOUS | Status: DC
  Administered 2023-08-03 – 2023-08-10 (×8): 6 via TOPICAL

## 2023-08-03 MED ORDER — POLYETHYLENE GLYCOL 3350 17 G PO PACK: 17.0000 g | PACK | Freq: Every day | ORAL | Status: DC | PRN

## 2023-08-03 MED ORDER — DOCUSATE SODIUM 100 MG PO CAPS: 100.0000 mg | ORAL_CAPSULE | Freq: Two times a day (BID) | ORAL | Status: DC | PRN

## 2023-08-03 MED ORDER — LACTATED RINGERS IV BOLUS
1000.0000 mL | Freq: Once | INTRAVENOUS | Status: AC
  Administered 2023-08-03: 1000 mL via INTRAVENOUS

## 2023-08-03 MED ORDER — LEVETIRACETAM IN NACL 1500 MG/100ML IV SOLN
1500.0000 mg | INTRAVENOUS | Status: AC
  Administered 2023-08-03 (×3): 1500 mg via INTRAVENOUS
  Filled 2023-08-03 (×3): qty 100

## 2023-08-03 MED ORDER — VANCOMYCIN HCL 1750 MG/350ML IV SOLN
1750.0000 mg | Freq: Once | INTRAVENOUS | Status: AC
  Administered 2023-08-03: 1750 mg via INTRAVENOUS
  Filled 2023-08-03: qty 350

## 2023-08-03 MED ORDER — VANCOMYCIN VARIABLE DOSE PER UNSTABLE RENAL FUNCTION (PHARMACIST DOSING): Status: DC

## 2023-08-03 MED ORDER — DEXTROSE 5 % IV SOLN
5.0000 mg/kg | INTRAVENOUS | Status: DC
  Administered 2023-08-03: 430 mg via INTRAVENOUS
  Filled 2023-08-03 (×2): qty 8.6

## 2023-08-03 MED ORDER — THIAMINE HCL 100 MG/ML IJ SOLN: 100.0000 mg | Freq: Every day | INTRAMUSCULAR | Status: DC

## 2023-08-03 MED ORDER — LORAZEPAM 2 MG/ML IJ SOLN
2.0000 mg | Freq: Once | INTRAMUSCULAR | Status: AC
  Administered 2023-08-03: 2 mg via INTRAVENOUS
  Filled 2023-08-03: qty 1

## 2023-08-03 MED ORDER — SODIUM CHLORIDE 0.9 % IV SOLN
2.0000 g | Freq: Two times a day (BID) | INTRAVENOUS | Status: DC
  Administered 2023-08-03 – 2023-08-04 (×3): 2 g via INTRAVENOUS
  Filled 2023-08-03 (×3): qty 20

## 2023-08-03 MED ORDER — LACTATED RINGERS IV SOLN: INTRAVENOUS | Status: DC

## 2023-08-03 MED ORDER — DEXMEDETOMIDINE HCL IN NACL 400 MCG/100ML IV SOLN
0.0000 ug/kg/h | INTRAVENOUS | Status: DC
  Administered 2023-08-03: 0.4 ug/kg/h via INTRAVENOUS
  Filled 2023-08-03: qty 100

## 2023-08-03 MED ORDER — ROCURONIUM BROMIDE 10 MG/ML (PF) SYRINGE
PREFILLED_SYRINGE | INTRAVENOUS | Status: AC
  Filled 2023-08-03: qty 10

## 2023-08-03 MED ORDER — THIAMINE HCL 100 MG/ML IJ SOLN
250.0000 mg | Freq: Every day | INTRAVENOUS | Status: DC
  Administered 2023-08-06 – 2023-08-07 (×2): 250 mg via INTRAVENOUS
  Filled 2023-08-03 (×3): qty 2.5

## 2023-08-03 MED ORDER — HALOPERIDOL LACTATE 5 MG/ML IJ SOLN
10.0000 mg | Freq: Once | INTRAMUSCULAR | Status: AC
  Administered 2023-08-03: 10 mg via INTRAMUSCULAR

## 2023-08-03 MED ORDER — SODIUM BICARBONATE 8.4 % IV SOLN
INTRAVENOUS | Status: AC
  Filled 2023-08-03: qty 50

## 2023-08-03 MED ORDER — PIPERACILLIN-TAZOBACTAM 3.375 G IVPB 30 MIN
3.3750 g | Freq: Once | INTRAVENOUS | Status: AC
  Administered 2023-08-03: 3.375 g via INTRAVENOUS
  Filled 2023-08-03: qty 50

## 2023-08-03 MED ORDER — MIDAZOLAM HCL 2 MG/2ML IJ SOLN
INTRAMUSCULAR | Status: AC
  Filled 2023-08-03: qty 2

## 2023-08-03 NOTE — Progress Notes (Signed)
 Pt with sudden agitation, concerning for seizure like activity. Pt became stiff, jerking (non rhythmic), and yelled out, but not coherent. No purposeful movement/not following commands. Elink notified of event and atrium monitoring button pressed. Patient then became minimally responsive again. This RN expressed concern for airway protection again due to continuous sonorous respirations and still being intermittently obtunded and minimally responsive. Advised to let patient rest and minimize stimulation and call back if another agitation event occurs.

## 2023-08-03 NOTE — ED Notes (Signed)
 Pt BP started to drop, EDP Lockwood advised and at bedside.

## 2023-08-03 NOTE — ED Triage Notes (Signed)
 Pt here from jail. Jail states pt has been on SI watch and screaming out, psych hx and not taking meds. Pt arrives with bilateral eye swelling and eyelid lacerations. Pt has lacerations to right thigh. Pt arrives in handcuffs.

## 2023-08-03 NOTE — Progress Notes (Signed)
 Pharmacy Antibiotic Note  Shane Klein is a 33 y.o. male admitted on 08/03/2023 with sepsis.  Pharmacy has been consulted for zosyn dosing. Pt is afebrile and WBC is elevated at 31.7. Pt with AKI with SCr of 7.76.   Plan: Zosyn 3.375g IV x 1 then 2.25g IV Q8H F/u renal fxn, C&S, clinical status  Height: 5\' 11"  (180.3 cm) IBW/kg (Calculated) : 75.3  Temp (24hrs), Avg:98 F (36.7 C), Min:97.8 F (36.6 C), Max:98 F (36.7 C)  Recent Labs  Lab 08/03/23 0750 08/03/23 1313  WBC 31.7*  --   CREATININE 7.76*  --   LATICACIDVEN  --  3.9*    Estimated Creatinine Clearance: 14.6 mL/min (A) (by C-G formula based on SCr of 7.76 mg/dL (H)).    Allergies  Allergen Reactions   Other Diarrhea, Nausea And Vomiting and Other (See Comments)    Seafood-patient does not know what reaction he has  Potato starch - abdominal pain, too   Gluten Meal Other (See Comments)    Stomach pain   Lactose Intolerance (Gi) Other (See Comments)    Pt advised RN he is lactose intolerant   Soybean Oil Diarrhea, Nausea And Vomiting and Other (See Comments)    Abdominal pain, too    Antimicrobials this admission: Zosyn 4/9>>  Dose adjustments this admission: N/A  Microbiology results: Pending  Thank you for allowing pharmacy to be a part of this patient's care.  Jozalyn Baglio, Drake Leach 08/03/2023 2:16 PM
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
## 2023-08-03 NOTE — Progress Notes (Signed)
 eLink Physician-Brief Progress Note Patient Name: Shane Klein DOB: 12-26-1990 MRN: [id number]   Date of Service  08/03/2023  HPI/Events of Note  Patient apparently transiently agitated and RN concerned for possible seizure but he had no tonic-clonic activity and he is on continuous EEG. Description appears more suggestive of intermittent agitated delirium.  eICU Interventions  RN instructed to turn down the lights and minimize stimulation and observe for recurrence of symptoms, and notify E-link if they recur.        [name] 08/03/2023, 11:06 PM

## 2023-08-03 NOTE — Progress Notes (Signed)
 Called to bedside for unresponsiveness. Came in with agitated delirium, received Ativan, Geodon and is now unresponsive. Possible admission previously considered to be due to gabapentin and baclofen.  He also has AKI. Repeat labs pending. No seizure activity noted EKG showing ST elevations but on my review appears to be repolarization changes and similar to EKG obtained from this morning and previous EKG.  I discussed this with cardiology who is in agreement. CK is elevated but will obtain troponin and echocardiogram for completion. He is able to protect his airway currently, has sonorous respirations, will check ABG to ensure no hypercarbia but hold off intubation unless he becomes completely obtunded, can DC Precedex Suggest obtain EEG Doubt this is catatonia, since he came in with agitation Head CT negative UDS positive for THC, ammonia normal  My additional critical care time was 32 minutes  Drue Camera V. Vassie Loll MD
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
## 2023-08-03 NOTE — Progress Notes (Signed)
 Pharmacy Antibiotic Note  Shane Klein is a 33 y.o. male admitted on 08/03/2023 presenting with AMS, concern now for meningitis.  Pharmacy has been consulted for vancomycin, acyclovir dosing.    Plan: Vancomycin 1750 mg IV x 1, then variable dosing d/t unstable renal function (target vancomycin trough 15-20) Acyclovir 5 mg/kg IV q 24h IVF LR @150  Ceftriaxone 2g IV q 12h Monitor renal function, Cx/LP and meningitis workup to narrow Vancomycin random level as needed  Height: 5\' 11"  (180.3 cm) IBW/kg (Calculated) : 75.3  Temp (24hrs), Avg:97.9 F (36.6 C), Min:97.6 F (36.4 C), Max:98 F (36.7 C)  Recent Labs  Lab 08/03/23 0750 08/03/23 1313  WBC 31.7*  --   CREATININE 7.76*  --   LATICACIDVEN  --  3.9*    Estimated Creatinine Clearance: 14.6 mL/min (A) (by C-G formula based on SCr of 7.76 mg/dL (H)).    Allergies  Allergen Reactions   Other Diarrhea, Nausea And Vomiting and Other (See Comments)    Seafood-patient does not know what reaction he has  Potato starch - abdominal pain, too   Gluten Meal Other (See Comments)    Stomach pain   Lactose Intolerance (Gi) Other (See Comments)    Pt advised RN he is lactose intolerant   Soybean Oil Diarrhea, Nausea And Vomiting and Other (See Comments)    Abdominal pain, too    Daylene Posey, PharmD, [facility] Clinical Pharmacist ED Pharmacist Phone # [phone] 08/03/2023 8:32 PM
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
## 2023-08-03 NOTE — ED Notes (Signed)
 Levophed started emergently via verbal order from Dr. Jeraldine Loots.  Dosing confirmed by Jeraldine Loots MD.  Pharmacy made aware medication was being started.

## 2023-08-03 NOTE — Progress Notes (Addendum)
 eLink Physician-Brief Progress Note Patient Name: Shane Klein DOB: 07-12-1990 MRN: [id number]   Date of Service  08/03/2023  HPI/Events of Note  Patient admitted with agitated delirium of unclear etiology, he also has abnormal LFT's, history of ulcerative colitis s/p bowel resection, patient had received multiple psychotropic / sedating medications earlier in the day but not recently, now obtunded and minimally responsive to noxious stimuli. EEG and empiric antibiotic coverage ordered.  eICU Interventions  New Patient Evaluation. PCCM ground crew requested to come and assess patient at bedside regarding need for possible intubation. EKG reviewed and not consistent with active STEMI.        Thomasene Lot Teria Khachatryan 08/03/2023, 8:01 PM
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
## 2023-08-03 NOTE — Transitions of Care (Post Inpatient/ED Visit) (Signed)
   08/03/2023  Name: Shane Klein MRN: [id number] DOB: 1990-06-16  Today's TOC FU Call Status: Today's TOC FU Call Status:: Unsuccessful Call (3rd Attempt) Unsuccessful Call (1st Attempt) Date: 07/28/23 Unsuccessful Call (2nd Attempt) Date: 08/02/23 Unsuccessful Call (3rd Attempt) Date: 08/03/23  Attempted to reach the patient regarding the most recent Inpatient/ED visit.  Follow Up Plan: No further outreach attempts will be made at this time. We have been unable to contact the patient.  Signature Karena Addison, LPN [facility] Nurse Health Advisor Direct Dial [phone]

## 2023-08-03 NOTE — ED Notes (Signed)
 Pt started to coughing on mucus. Pt suctioned.

## 2023-08-03 NOTE — Progress Notes (Signed)
 No initial skin breakdown, atrium monitoring, push button
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
## 2023-08-03 NOTE — ED Notes (Signed)
 Tried to call report, nurse in a room will call back
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
## 2023-08-04 ENCOUNTER — Inpatient Hospital Stay (HOSPITAL_COMMUNITY)

## 2023-08-04 DIAGNOSIS — M6282 Rhabdomyolysis: Secondary | ICD-10-CM | POA: Diagnosis not present

## 2023-08-04 DIAGNOSIS — R569 Unspecified convulsions: Secondary | ICD-10-CM

## 2023-08-04 DIAGNOSIS — R7401 Elevation of levels of liver transaminase levels: Secondary | ICD-10-CM | POA: Diagnosis not present

## 2023-08-04 DIAGNOSIS — R579 Shock, unspecified: Secondary | ICD-10-CM | POA: Diagnosis not present

## 2023-08-04 DIAGNOSIS — G9340 Encephalopathy, unspecified: Secondary | ICD-10-CM | POA: Diagnosis not present

## 2023-08-04 LAB — GLUCOSE, CAPILLARY
Glucose-Capillary: 92 mg/dL (ref 70–99)
Glucose-Capillary: 74 mg/dL (ref 70–99)
Glucose-Capillary: 66 mg/dL — ABNORMAL LOW (ref 70–99)
Glucose-Capillary: 81 mg/dL (ref 70–99)
Glucose-Capillary: 105 mg/dL — ABNORMAL HIGH (ref 70–99)
Glucose-Capillary: 92 mg/dL (ref 70–99)
Glucose-Capillary: 88 mg/dL (ref 70–99)

## 2023-08-04 LAB — CBC
HCT: 33 % — ABNORMAL LOW (ref 39.0–52.0)
Hemoglobin: 11.8 g/dL — ABNORMAL LOW (ref 13.0–17.0)
MCH: 29.1 pg (ref 26.0–34.0)
MCHC: 35.8 g/dL (ref 30.0–36.0)
MCV: 81.3 fL (ref 80.0–100.0)
Platelets: 279 10*3/uL (ref 150–400)
RBC: 4.06 MIL/uL — ABNORMAL LOW (ref 4.22–5.81)
RDW: 14.5 % (ref 11.5–15.5)
WBC: 14.3 10*3/uL — ABNORMAL HIGH (ref 4.0–10.5)
nRBC: 0 % (ref 0.0–0.2)

## 2023-08-04 LAB — COMPREHENSIVE METABOLIC PANEL WITH GFR
ALT: 284 U/L — ABNORMAL HIGH (ref 0–44)
AST: 179 U/L — ABNORMAL HIGH (ref 15–41)
Albumin: 3.2 g/dL — ABNORMAL LOW (ref 3.5–5.0)
Alkaline Phosphatase: 90 U/L (ref 38–126)
Anion gap: 20 — ABNORMAL HIGH (ref 5–15)
BUN: 90 mg/dL — ABNORMAL HIGH (ref 6–20)
CO2: 23 mmol/L (ref 22–32)
Calcium: 8.7 mg/dL — ABNORMAL LOW (ref 8.9–10.3)
Chloride: 97 mmol/L — ABNORMAL LOW (ref 98–111)
Creatinine, Ser: 4.41 mg/dL — ABNORMAL HIGH (ref 0.61–1.24)
GFR, Estimated: 17 mL/min — ABNORMAL LOW (ref >60)
Glucose, Bld: 75 mg/dL (ref 70–99)
Potassium: 4 mmol/L (ref 3.5–5.1)
Sodium: 140 mmol/L (ref 135–145)
Total Bilirubin: 1.9 mg/dL — ABNORMAL HIGH (ref 0.0–1.2)
Total Protein: 7 g/dL (ref 6.5–8.1)

## 2023-08-04 LAB — MENINGITIS/ENCEPHALITIS PANEL (CSF)

## 2023-08-04 LAB — CSF CELL COUNT WITH DIFFERENTIAL
RBC Count, CSF: 640 /mm3 — ABNORMAL HIGH
Tube #: 4
WBC, CSF: 3 /mm3 (ref 0–5)

## 2023-08-04 LAB — VANCOMYCIN, RANDOM: Vancomycin Rm: 23 ug/mL

## 2023-08-04 LAB — RPR: RPR Ser Ql: NONREACTIVE

## 2023-08-04 LAB — PROTEIN AND GLUCOSE, CSF
Glucose, CSF: 68 mg/dL (ref 40–70)
Total  Protein, CSF: 78 mg/dL — ABNORMAL HIGH (ref 15–45)

## 2023-08-04 LAB — CK: Total CK: 5371 U/L — ABNORMAL HIGH (ref 49–397)

## 2023-08-04 LAB — LACTIC ACID, PLASMA: Lactic Acid, Venous: 1.7 mmol/L (ref 0.5–1.9)

## 2023-08-04 MED ORDER — DEXTROSE 5 % IV SOLN
10.0000 mg/kg | INTRAVENOUS | Status: DC
  Administered 2023-08-04: 855 mg via INTRAVENOUS
  Filled 2023-08-04: qty 17.1

## 2023-08-04 MED ORDER — DEXMEDETOMIDINE HCL IN NACL 400 MCG/100ML IV SOLN
0.0000 ug/kg/h | INTRAVENOUS | Status: DC
Start: 2023-08-04 — End: 2023-08-05
  Administered 2023-08-04: 0.4 ug/kg/h via INTRAVENOUS
  Administered 2023-08-05: 0.3 ug/kg/h via INTRAVENOUS
  Filled 2023-08-04: qty 200

## 2023-08-04 MED ORDER — ORAL CARE MOUTH RINSE: 15.0000 mL | OROMUCOSAL | Status: DC | PRN

## 2023-08-04 MED ORDER — HEPARIN SODIUM (PORCINE) 5000 UNIT/ML IJ SOLN: 5000.0000 [IU] | Freq: Three times a day (TID) | INTRAMUSCULAR | Status: DC

## 2023-08-04 MED ORDER — ORAL CARE MOUTH RINSE
15.0000 mL | OROMUCOSAL | Status: DC
  Administered 2023-08-04 – 2023-08-10 (×25): 15 mL via OROMUCOSAL

## 2023-08-04 MED ORDER — MIDAZOLAM HCL 2 MG/2ML IJ SOLN
INTRAMUSCULAR | Status: DC
Start: 2023-08-04 — End: 2023-08-04
  Filled 2023-08-04: qty 2

## 2023-08-04 MED ORDER — HEPARIN SODIUM (PORCINE) 5000 UNIT/ML IJ SOLN
5000.0000 [IU] | Freq: Three times a day (TID) | INTRAMUSCULAR | Status: DC
  Administered 2023-08-04 – 2023-08-10 (×16): 5000 [IU] via SUBCUTANEOUS
  Filled 2023-08-04 (×16): qty 1

## 2023-08-04 MED ORDER — DEXTROSE 5 % IV SOLN: INTRAVENOUS | Status: AC

## 2023-08-04 MED ORDER — LACTATED RINGERS IV BOLUS
1000.0000 mL | Freq: Once | INTRAVENOUS | Status: AC
  Administered 2023-08-04: 1000 mL via INTRAVENOUS

## 2023-08-04 MED ORDER — DEXMEDETOMIDINE HCL IN NACL 400 MCG/100ML IV SOLN
INTRAVENOUS | Status: AC
  Administered 2023-08-04: 0.4 ug/kg/h via INTRAVENOUS
  Filled 2023-08-04: qty 100

## 2023-08-04 MED ORDER — FENTANYL CITRATE PF 50 MCG/ML IJ SOSY
PREFILLED_SYRINGE | INTRAMUSCULAR | Status: AC
  Filled 2023-08-04: qty 1

## 2023-08-04 MED ORDER — DEXTROSE 50 % IV SOLN
INTRAVENOUS | Status: AC
  Administered 2023-08-04: 50 mL
  Filled 2023-08-04: qty 50
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
## 2023-08-04 NOTE — Procedures (Signed)
 PROCEDURE SUMMARY:  Successful fluoroscopic guided lumbar puncture at the level of L4-5.  Opening pressure was 18 cm H2O ~10.5 mL clear colorless fluid collected and sent for labs.  No immediate complications.  Pt tolerated well.   EBL = none  Please see full dictation in imaging section of Epic for procedure details.   Electronically Signed: Jama Flavors, PA-C 08/04/2023, 4:16 PM  p

## 2023-08-04 NOTE — Progress Notes (Addendum)
 Pharmacy Antibiotic Note  Shane Klein is a 33 y.o. male admitted on 08/03/2023 presenting with AMS, concern now for meningitis.  Pharmacy has been consulted for vancomycin, acyclovir dosing. Pt also on Rocephin.   Plan for LP today per neuro. SCr improved to 4.4 (from 7/7), UOP past 24h  Plan: Loaded with vancomycin 1750 mg IV x 1 4/10 2251 , then variable dosing d/t unstable renal function (target vancomycin trough 15-20) Will check random vanc level ~24h post dose Change Acyclovir to 855 mg (10mg /kg) q24h - okay to give dose early since only 5mg  given yesterday IVF LR 1L now then D5 9ml/hr Ceftriaxone 2g IV q 12h Monitor renal function, Cx/LP and meningitis workup to narrow  Height: 5\' 11"  (180.3 cm) Weight: 85.4 kg (188 lb 4.4 oz) IBW/kg (Calculated) : 75.3  Temp (24hrs), Avg:97.6 F (36.4 C), Min:97.4 F (36.3 C), Max:97.8 F (36.6 C)  Recent Labs  Lab 08/03/23 0750 08/03/23 1313 08/04/23 0430 08/04/23 0853  WBC 31.7*  --  14.3*  --   CREATININE 7.76*  --  4.41*  --   LATICACIDVEN  --  3.9*  --  1.7    Estimated Creatinine Clearance: 25.6 mL/min (A) (by C-G formula based on SCr of 4.41 mg/dL (H)).    Allergies  Allergen Reactions   Other Diarrhea, Nausea And Vomiting and Other (See Comments)    Seafood-patient does not know what reaction he has  Potato starch - abdominal pain, too   Gluten Meal Other (See Comments)    Stomach pain   Lactose Intolerance (Gi) Other (See Comments)    Pt advised RN he is lactose intolerant   Soybean Oil Diarrhea, Nausea And Vomiting and Other (See Comments)    Abdominal pain, too    Christoper Fabian, PharmD, BCPS Please see amion for complete clinical pharmacist phone list 08/04/2023 10:43 AM

## 2023-08-04 NOTE — TOC CM/SW Note (Signed)
 Transition of Care [facility]) - Inpatient Brief Assessment   Patient Details  Name: Shane Klein MRN: [id number] Date of Birth: [birth year]  Transition of [facility] CM/SW Contact:    Tom-Johnson, Hershal Coria, RN Phone Number: 08/04/2023, 1:06 PM   Clinical Narrative:  Patient presented to the ED from [facility] with Altered Mental Status and facial Trauma. Patient too agitated for CM to assess at this time.   TOC will continue to follow and assess when appropriate.       Transition of Care Asessment:

## 2023-08-04 NOTE — Progress Notes (Signed)
 NEUROLOGY CONSULT FOLLOW UP NOTE   Date of service: August 04, 2023 Patient Name: ROSALIE GELPI MRN:  [id number] DOB:  08-15-1990  Interval Hx/subjective  Continues to be encephalopathic  Vitals   Vitals:   08/04/23 0800 08/04/23 0802 08/04/23 0900 08/04/23 0922  BP: 127/77  (!) 145/84 124/67  Pulse: 88  (!) 105 97  Resp: 13  16 15   Temp:  97.8 F (36.6 C)    TempSrc:  Axillary    SpO2: 98%  93% 94%  Weight:      Height:         Body mass index is 26.26 kg/m.  Physical Exam  General In bed, wearing a mask  Neurologic Examination    MS: Does not follow commands, with noxious stimulation, he does become quite agitated CN: Corneals are intact, pupils are reactive Motor: Moves all extremities both spontaneously and to noxious stimulation Sensory: As above  Medications  Current Facility-Administered Medications:    acyclovir (ZOVIRAX) 430 mg in dextrose 5 % 100 mL IVPB, 5 mg/kg, Intravenous, Q24H, Daylene Posey, RPH, Stopped at 08/03/23 2230   cefTRIAXone (ROCEPHIN) 2 g in sodium chloride 0.9 % 100 mL IVPB, 2 g, Intravenous, Q12H, Daylene Posey, RPH, Stopped at 08/04/23 4098   Chlorhexidine Gluconate Cloth 2 % PADS 6 each, 6 each, Topical, Daily, Mannam, Praveen, MD, 6 each at 08/03/23 2059   dextrose 5 % solution, , Intravenous, Continuous, Payne, John D, PA-C   docusate sodium (COLACE) capsule 100 mg, 100 mg, Oral, BID PRN, Pia Mau D, PA-C   heparin injection 5,000 Units, 5,000 Units, Subcutaneous, Q8H, Payne, John D, PA-C   lactated ringers bolus 1,000 mL, 1,000 mL, Intravenous, Once, Suzie Portela, John D, PA-C   mupirocin ointment (BACTROBAN) 2 %, , Topical, Daily, Mannam, Praveen, MD, Given at 08/03/23 1339   Oral care mouth rinse, 15 mL, Mouth Rinse, 4 times per day, Mannam, Praveen, MD, 15 mL at 08/04/23 0814   Oral care mouth rinse, 15 mL, Mouth Rinse, PRN, Mannam, Praveen, MD   polyethylene glycol (MIRALAX / GLYCOLAX) packet 17 g, 17 g, Oral, Daily PRN, Suzie Portela,  John D, PA-C   thiamine (VITAMIN B1) 500 mg in sodium chloride 0.9 % 50 mL IVPB, 500 mg, Intravenous, Q8H, Stopped at 08/04/23 0547 **FOLLOWED BY** [START ON 08/06/2023] thiamine (VITAMIN B1) 250 mg in sodium chloride 0.9 % 50 mL IVPB, 250 mg, Intravenous, Daily **FOLLOWED BY** [START ON 08/11/2023] thiamine (VITAMIN B1) injection 100 mg, 100 mg, Intravenous, Daily, Erick Blinks, MD   vancomycin variable dose per unstable renal function (pharmacist dosing), , Does not apply, See admin instructions, Daylene Posey, [facility]  Labs and Diagnostic Imaging   AKI with BUN of 90 CK at 5000  Imaging(Personally reviewed): CT head is negative  Assessment   Willaim Mode Byrum is a 33 y.o. male with recurrent episodes of decreased responsiveness, with concern for seizures given episodic spells.  He was loaded with Keppra and started on empiric meningitis coverage.  My suspicion continues that this is likely related to his metabolic issues, but agree that other etiologies need to be ruled out.  Recommendations  Lumbar puncture MRI brain Continue to treat underlying metabolic issues I would favor holding Keppra for now unless he was to have seizures on EEG. Neurology will follow ______________________________________________________________________   Signed, Ritta Slot, MD [facility]

## 2023-08-04 NOTE — Progress Notes (Signed)
 [facility] Progress Note   Assessment/ Plan:    Recurrent AKI:  maybe hypovolemia, also CK slightly high at > 3000.  Doubt toxic ingestion as recently incarcerated, not super acidotic either.  History of high-output ileostomy.             - aggressive IVFs has improved his situation dramatically             - would not retrial baclofen and gabapentin  - no need for HD as cr improving and making urine  - will sign off- call with questions                2.  AMS:             - polypharmacy likely contributing             - got Geodon in ED and now on precedex gtt             - pan-scn pending   3.  Mutliple contusions etc             - imaging pending   4. Shock:             - hypovolemic              - on pressors             - per PCCM   5.  Dispo: to ICU  Subjective:    Seen in room.  Agitated and yelling.  Making urine, Cr coming down   Objective:   BP 124/67 (BP Location: Left Arm)   Pulse 97   Temp 97.8 F (36.6 C) (Axillary)   Resp 15   Ht 5\' 11"  (1.803 m)   Wt 85.4 kg   SpO2 94%   BMI 26.26 kg/m   Physical Exam: GEN appears ill, agitated, EEG leads on HEENT bilateral forehead contusions NECK no JVD PULM clear CVtachycardic ABD + ostomy, liquid stool, midline incisions EXT no LE edema NEURO yelling out SKIN multiple lacerations and contusions   Labs: BMET Recent Labs  Lab 08/03/23 0750 08/03/23 2013 08/04/23 0430  NA 136 138 140  K 4.1 3.9 4.0  CL 87*  --  97*  CO2 18*  --  23  GLUCOSE 96  --  75  BUN 111*  --  90*  CREATININE 7.76*  --  4.41*  CALCIUM 10.0  --  8.7*   CBC Recent Labs  Lab 08/03/23 0750 08/03/23 2013 08/04/23 0430  WBC 31.7*  --  14.3*  NEUTROABS 28.5*  --   --   HGB 14.4 11.9* 11.8*  HCT 41.2 35.0* 33.0*  MCV 80.2  --  81.3  PLT 478*  --  279      Medications:     Chlorhexidine Gluconate Cloth  6 each Topical Daily   heparin injection (subcutaneous)  5,000 Units Subcutaneous Q8H   mupirocin  ointment   Topical Daily   mouth rinse  15 mL Mouth Rinse 4 times per day   [START ON 08/11/2023] thiamine (VITAMIN B1) injection  100 mg Intravenous Daily   vancomycin variable dose per unstable renal function (pharmacist dosing)   Does not apply See admin instructions     Bufford Buttner MD 08/04/2023, 10:36 AM

## 2023-08-04 NOTE — Progress Notes (Signed)
 eLink Physician-Brief Progress Note Patient Name: Shane Klein DOB: [birth year] MRN: [id number]   Date of Service  08/04/2023  HPI/Events of Note  Patient continues to have intermittent delirium with calm moments in between, breathing spontaneously and protecting his airway.  eICU Interventions  Continue to monitor.        Thomasene Lot [name] 08/04/2023, 5:19 AM

## 2023-08-04 NOTE — Progress Notes (Signed)
 Pharmacy Antibiotic Note  Shane Klein is a 33 y.o. male admitted on 08/03/2023 presenting with AMS, concern now for meningitis.  Pharmacy has been consulted for vancomycin, acyclovir dosing. Pt also on Rocephin.   Now s/p LP today. SCr improved to 4.4 (from 7/7), UOP past 24hr. Vancomycin random ~19 hours after last dose came back at 23.   Plan: Hold on further vancomycin dosing - will plan for vancomycin random with AM labs to calculate clearance before dosing Ceftriaxone 2g IV q 12h Monitor renal function, Cx/LP and meningitis workup to narrow  Height: 5\' 11"  (180.3 cm) Weight: 85.4 kg (188 lb 4.4 oz) IBW/kg (Calculated) : 75.3  Temp (24hrs), Avg:97.7 F (36.5 C), Min:97.3 F (36.3 C), Max:98.8 F (37.1 C)  Recent Labs  Lab 08/03/23 0750 08/03/23 1313 08/04/23 0430 08/04/23 0853 08/04/23 1836  WBC 31.7*  --  14.3*  --   --   CREATININE 7.76*  --  4.41*  --   --   LATICACIDVEN  --  3.9*  --  1.7  --   VANCORANDOM  --   --   --   --  23    Estimated Creatinine Clearance: 25.6 mL/min (A) (by C-G formula based on SCr of 4.41 mg/dL (H)).    Allergies  Allergen Reactions   Other Diarrhea, Nausea And Vomiting and Other (See Comments)    Seafood-patient does not know what reaction he has  Potato starch - abdominal pain, too   Gluten Meal Other (See Comments)    Stomach pain   Lactose Intolerance (Gi) Other (See Comments)    Pt advised RN he is lactose intolerant   Soybean Oil Diarrhea, Nausea And Vomiting and Other (See Comments)    Abdominal pain, too    Thank you for allowing pharmacy to participate in this patient's care,  Sherron Monday, PharmD, BCCCP Clinical Pharmacist  Phone: [phone] 08/04/2023 7:46 PM  Please check AMION for all [facility] phone numbers After 10:00 PM, call Main Pharmacy [phone]

## 2023-08-04 NOTE — Procedures (Signed)
 LUMBAR PUNCTURE (SPINAL TAP) PROCEDURE NOTE  Indication: AMS   Proceduralists: Gevena Mart NP/Dr. Amada Jupiter    Risks, benefits and alternatives of the procedure were dicussed with the patient including but not limited to post-LP headache, bleeding, infection, weakness/numbness of legs(radiculopathy), death.    Consent obtained from: Joycelyn Rua, MD -Medical director at [facility]    Procedure Note The patient was prepped and draped, and using sterile technique a 20 gauge quinke spinal needle was inserted in the L4-5 space and was unsuccessful x2 attempts   Patient tolerated the procedure well and blood loss was minimal.   Gevena Mart DNP, ACNPC-AG  [facility]

## 2023-08-05 ENCOUNTER — Encounter (HOSPITAL_COMMUNITY)

## 2023-08-05 DIAGNOSIS — M6282 Rhabdomyolysis: Secondary | ICD-10-CM | POA: Diagnosis not present

## 2023-08-05 DIAGNOSIS — R569 Unspecified convulsions: Secondary | ICD-10-CM | POA: Diagnosis not present

## 2023-08-05 DIAGNOSIS — R579 Shock, unspecified: Secondary | ICD-10-CM | POA: Diagnosis not present

## 2023-08-05 DIAGNOSIS — G9340 Encephalopathy, unspecified: Secondary | ICD-10-CM | POA: Diagnosis not present

## 2023-08-05 DIAGNOSIS — R7401 Elevation of levels of liver transaminase levels: Secondary | ICD-10-CM | POA: Diagnosis not present

## 2023-08-05 LAB — GLUCOSE, CAPILLARY
Glucose-Capillary: 92 mg/dL (ref 70–99)
Glucose-Capillary: 91 mg/dL (ref 70–99)
Glucose-Capillary: 85 mg/dL (ref 70–99)
Glucose-Capillary: 96 mg/dL (ref 70–99)
Glucose-Capillary: 100 mg/dL — ABNORMAL HIGH (ref 70–99)
Glucose-Capillary: 100 mg/dL — ABNORMAL HIGH (ref 70–99)

## 2023-08-05 LAB — COMPREHENSIVE METABOLIC PANEL WITH GFR
ALT: 195 U/L — ABNORMAL HIGH (ref 0–44)
AST: 109 U/L — ABNORMAL HIGH (ref 15–41)
Albumin: 2.8 g/dL — ABNORMAL LOW (ref 3.5–5.0)
Alkaline Phosphatase: 86 U/L (ref 38–126)
Anion gap: 13 (ref 5–15)
BUN: 66 mg/dL — ABNORMAL HIGH (ref 6–20)
CO2: 25 mmol/L (ref 22–32)
Calcium: 9 mg/dL (ref 8.9–10.3)
Chloride: 104 mmol/L (ref 98–111)
Creatinine, Ser: 2.69 mg/dL — ABNORMAL HIGH (ref 0.61–1.24)
GFR, Estimated: 31 mL/min — ABNORMAL LOW (ref >60)
Glucose, Bld: 100 mg/dL — ABNORMAL HIGH (ref 70–99)
Potassium: 3.3 mmol/L — ABNORMAL LOW (ref 3.5–5.1)
Sodium: 142 mmol/L (ref 135–145)
Total Bilirubin: 1.1 mg/dL (ref 0.0–1.2)
Total Protein: 6.8 g/dL (ref 6.5–8.1)

## 2023-08-05 LAB — CBC
HCT: 29.7 % — ABNORMAL LOW (ref 39.0–52.0)
Hemoglobin: 10.5 g/dL — ABNORMAL LOW (ref 13.0–17.0)
MCH: 28.8 pg (ref 26.0–34.0)
MCHC: 35.4 g/dL (ref 30.0–36.0)
MCV: 81.4 fL (ref 80.0–100.0)
Platelets: 277 10*3/uL (ref 150–400)
RBC: 3.65 MIL/uL — ABNORMAL LOW (ref 4.22–5.81)
RDW: 14.6 % (ref 11.5–15.5)
WBC: 7.9 10*3/uL (ref 4.0–10.5)
nRBC: 0 % (ref 0.0–0.2)

## 2023-08-05 LAB — LACTIC ACID, PLASMA: Lactic Acid, Venous: 1 mmol/L (ref 0.5–1.9)

## 2023-08-05 LAB — CK: Total CK: 2341 U/L — ABNORMAL HIGH (ref 49–397)

## 2023-08-05 LAB — VANCOMYCIN, RANDOM: Vancomycin Rm: 15 ug/mL

## 2023-08-05 MED ORDER — POTASSIUM CHLORIDE 10 MEQ/100ML IV SOLN
10.0000 meq | INTRAVENOUS | Status: AC
Start: 2023-08-05 — End: 2023-08-05
  Administered 2023-08-05 (×2): 10 meq via INTRAVENOUS
  Filled 2023-08-05 (×2): qty 100

## 2023-08-05 MED ORDER — LORAZEPAM 2 MG/ML IJ SOLN: 1.0000 mg | Freq: Four times a day (QID) | INTRAMUSCULAR | Status: DC | PRN

## 2023-08-05 MED ORDER — HYALURONIDASE HUMAN 150 UNIT/ML IJ SOLN
150.0000 [IU] | Freq: Once | INTRAMUSCULAR | Status: AC
  Administered 2023-08-05: 150 [IU] via SUBCUTANEOUS
  Filled 2023-08-05: qty 1

## 2023-08-05 MED ORDER — DEXTROSE 5 % IV SOLN: INTRAVENOUS | Status: AC

## 2023-08-05 MED ORDER — QUETIAPINE FUMARATE 100 MG PO TABS
100.0000 mg | ORAL_TABLET | Freq: Two times a day (BID) | ORAL | Status: DC | PRN
  Administered 2023-08-05 – 2023-08-10 (×5): 100 mg via ORAL
  Filled 2023-08-05 (×5): qty 1

## 2023-08-05 MED ORDER — ACETAMINOPHEN 325 MG PO TABS
650.0000 mg | ORAL_TABLET | ORAL | Status: DC | PRN
  Administered 2023-08-05: 650 mg via ORAL
  Filled 2023-08-05: qty 2

## 2023-08-05 NOTE — Progress Notes (Signed)
 NEUROLOGY CONSULT FOLLOW UP NOTE   Date of service: August 05, 2023 Patient Name: Shane Klein MRN:  [id number] DOB:  08-27-1990  Interval Hx/subjective   He is much improved compared to yesterday  Vitals   Vitals:   08/05/23 0545 08/05/23 0600 08/05/23 0800 08/05/23 0802  BP: (!) 100/58 105/64 103/64   Pulse: 71 74 75   Resp: 16 16 20    Temp:    97.8 F (36.6 C)  TempSrc:    Oral  SpO2: 97% 96% 97%   Weight:      Height:         Body mass index is 26.84 kg/m.  Physical Exam  General In bed, wearing a mask  Neurologic Examination    MS: Continues to be encephalopathic, but I am able to get him to arouse enough to answer his name, and follow commands to show thumbs and wiggle toes bilaterally CN: Blinks to eyelid stimulation, pupils are reactive face is symmetric Motor: Moves all extremities to command Sensory: Endorses symmetric sensation  Medications  Current Facility-Administered Medications:    cefTRIAXone (ROCEPHIN) 2 g in sodium chloride 0.9 % 100 mL IVPB, 2 g, Intravenous, Q12H, Shane Klein, RPH, Stopped at 08/04/23 2222   Chlorhexidine Gluconate Cloth 2 % PADS 6 each, 6 each, Topical, Daily, Klein, Praveen, MD, 6 each at 08/04/23 1253   dexmedetomidine (PRECEDEX) 400 MCG/100ML (4 mcg/mL) infusion, 0-1.8 mcg/kg/hr, Intravenous, Continuous, Klein, Shane Klein, Klein, Last Rate: 6.41 mL/hr at 08/05/23 0800, 0.3 mcg/kg/hr at 08/05/23 0800   dextrose 5 % solution, , Intravenous, Continuous, Shane Collum, Klein, Last Rate: 75 mL/hr at 08/05/23 0800, Infusion Verify at 08/05/23 0800   docusate sodium (COLACE) capsule 100 mg, 100 mg, Oral, BID PRN, Shane Mau Klein, Klein   heparin injection 5,000 Units, 5,000 Units, Subcutaneous, Q8H, Shane Klein, Shane Klein, 5,000 Units at 08/05/23 6962   mupirocin ointment (BACTROBAN) 2 %, , Topical, Daily, Klein, Praveen, MD, Given at 08/04/23 1044   Oral care mouth rinse, 15 mL, Mouth Rinse, 4 times per day, Klein, Praveen, MD, 15  mL at 08/05/23 0825   Oral care mouth rinse, 15 mL, Mouth Rinse, PRN, Klein, Praveen, MD   polyethylene glycol (MIRALAX / GLYCOLAX) packet 17 g, 17 g, Oral, Daily PRN, Shane Klein, Shane Klein, Klein   thiamine (VITAMIN B1) 500 mg in sodium chloride 0.9 % 50 mL IVPB, 500 mg, Intravenous, Q8H, Paused at 08/05/23 0542 **FOLLOWED BY** [START ON 08/06/2023] thiamine (VITAMIN B1) 250 mg in sodium chloride 0.9 % 50 mL IVPB, 250 mg, Intravenous, Daily **FOLLOWED BY** [START ON 08/11/2023] thiamine (VITAMIN B1) injection 100 mg, 100 mg, Intravenous, Daily, Shane Klein, Salman, MD   vancomycin variable dose per unstable renal function (pharmacist dosing), , Does not apply, See admin instructions, Shane Klein, [facility]  Labs and Diagnostic Imaging   BUN has improved to 66  Imaging(Personally reviewed): CT head is negative  Assessment   Shane Klein is a 33 y.o. male with recurrent episodes of decreased responsiveness, with concern for seizures given episodic spells.  He was loaded with Keppra and started on empiric meningitis coverage.  With his improvement, I think we can hold off on imaging, given that he is improving as his metabolic issuesimprove and he has no focal findings.   With negative LP, we can discontinue empiric antibiotics, I stopped acyclovir last night.  Recommendations  Continue to treat underlying metabolic issues Can discontinue meningitic coverage Neurology will follow ______________________________________________________________________   Signed, Shane Slot,  MD [facility]

## 2023-08-05 NOTE — Progress Notes (Signed)
 eLink Physician-Brief Progress Note Patient Name: Shane Klein DOB: [birth year] MRN: [id number]   Date of Service  08/05/2023  HPI/Events of Note  K+ 3.3, Cr 2.69  eICU Interventions  KCL 10 meq iv Q 1 hour x 2 ordered.        Thomasene Lot Ndrew Creason 08/05/2023, 6:07 AM
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
## 2023-08-05 NOTE — Progress Notes (Signed)
 PCCM note  Right arm swelling noted Ordered US of the arm  Shane Greathouse MD [facility] See Amion for pager  If no response to pager , please call [phone] until 7pm After 7:00 pm call Elink  [phone] 08/05/2023, 4:44 PM

## 2023-08-05 NOTE — Progress Notes (Signed)
 LTM EEG discontinued - no skin breakdown at [facility].
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
## 2023-08-06 ENCOUNTER — Encounter (HOSPITAL_COMMUNITY)

## 2023-08-06 DIAGNOSIS — G9340 Encephalopathy, unspecified: Secondary | ICD-10-CM | POA: Diagnosis not present

## 2023-08-06 LAB — CBC
HCT: 31.8 % — ABNORMAL LOW (ref 39.0–52.0)
Hemoglobin: 10.9 g/dL — ABNORMAL LOW (ref 13.0–17.0)
MCH: 28.1 pg (ref 26.0–34.0)
MCHC: 34.3 g/dL (ref 30.0–36.0)
MCV: 82 fL (ref 80.0–100.0)
Platelets: 283 10*3/uL (ref 150–400)
RBC: 3.88 MIL/uL — ABNORMAL LOW (ref 4.22–5.81)
RDW: 14.9 % (ref 11.5–15.5)
WBC: 6.8 10*3/uL (ref 4.0–10.5)
nRBC: 0 % (ref 0.0–0.2)

## 2023-08-06 LAB — VITAMIN B1: Vitamin B1 (Thiamine): 153.7 nmol/L (ref 66.5–200.0)

## 2023-08-06 LAB — COMPREHENSIVE METABOLIC PANEL WITH GFR
ALT: 151 U/L — ABNORMAL HIGH (ref 0–44)
AST: 74 U/L — ABNORMAL HIGH (ref 15–41)
Albumin: 3.2 g/dL — ABNORMAL LOW (ref 3.5–5.0)
Alkaline Phosphatase: 88 U/L (ref 38–126)
Anion gap: 12 (ref 5–15)
BUN: 38 mg/dL — ABNORMAL HIGH (ref 6–20)
CO2: 26 mmol/L (ref 22–32)
Calcium: 9.4 mg/dL (ref 8.9–10.3)
Chloride: 99 mmol/L (ref 98–111)
Creatinine, Ser: 1.87 mg/dL — ABNORMAL HIGH (ref 0.61–1.24)
GFR, Estimated: 48 mL/min — ABNORMAL LOW (ref >60)
Glucose, Bld: 99 mg/dL (ref 70–99)
Potassium: 3.3 mmol/L — ABNORMAL LOW (ref 3.5–5.1)
Sodium: 137 mmol/L (ref 135–145)
Total Bilirubin: 1.1 mg/dL (ref 0.0–1.2)
Total Protein: 7.9 g/dL (ref 6.5–8.1)

## 2023-08-06 LAB — GLUCOSE, CAPILLARY
Glucose-Capillary: 92 mg/dL (ref 70–99)
Glucose-Capillary: 95 mg/dL (ref 70–99)
Glucose-Capillary: 124 mg/dL — ABNORMAL HIGH (ref 70–99)
Glucose-Capillary: 115 mg/dL — ABNORMAL HIGH (ref 70–99)
Glucose-Capillary: 107 mg/dL — ABNORMAL HIGH (ref 70–99)

## 2023-08-06 LAB — PHOSPHORUS: Phosphorus: 2.6 mg/dL (ref 2.5–4.6)

## 2023-08-06 LAB — CK: Total CK: 817 U/L — ABNORMAL HIGH (ref 49–397)

## 2023-08-06 LAB — MAGNESIUM: Magnesium: 2 mg/dL (ref 1.7–2.4)

## 2023-08-06 MED ORDER — POTASSIUM CHLORIDE CRYS ER 20 MEQ PO TBCR
40.0000 meq | EXTENDED_RELEASE_TABLET | Freq: Two times a day (BID) | ORAL | Status: DC
  Administered 2023-08-06 – 2023-08-07 (×2): 40 meq via ORAL
  Filled 2023-08-06 (×4): qty 2

## 2023-08-06 MED ORDER — DEXTROSE 5 % IV SOLN: INTRAVENOUS | Status: DC

## 2023-08-06 MED ORDER — ONDANSETRON HCL 4 MG/2ML IJ SOLN
4.0000 mg | Freq: Four times a day (QID) | INTRAMUSCULAR | Status: AC | PRN
  Administered 2023-08-06 – 2023-08-07 (×2): 4 mg via INTRAVENOUS
  Filled 2023-08-06 (×2): qty 2

## 2023-08-06 NOTE — Progress Notes (Signed)
 PROGRESS NOTE    Shane Klein  NWG:[id number] DOB: 30-Apr-1990 DOA: 08/03/2023 PCP: Marius Siemens, NP    Brief Narrative:  33 year old with history of Crohn's disease with small bowel obstruction status post ileostomy presented [facility] emergency room from jail with altered mental status and hypotension.  Recent admission with similar presentation with altered mental status, elevated LFTs, AKI and rhabdomyolysis which was thought to be from polypharmacy.  He was discharged with normal condition.  On arrival, CT head and EEG were negative.  Patient was hypotensive and altered.  He was admitted to the ICU and treated with IV fluid and vasopressors.  Patient was found with creatinine of 7.7, anion gap 31, BUN 111.  Reportedly he was thrashing around and bumping around, hitting his head in the jail. In the emergency room, hypotensive, altered.  Required Levophed and aggressive fluid resuscitation.  Seen by nephrology.  Admitted to ICU.  Also seen by neurology. 4/12, transferred out of ICU.  Still altered.  Subjective: Patient seen and examined.  Unable to keep up any conversation.  Does not open eyes.  Looks comfortable.  Unable to obtain any history. Engineer, manufacturing systems at the bedside.  One-to-one sitter at the bedside. Assessment & Plan:   Acute encephalopathy: Suspected secondary to polypharmacy and also with uremia.  Recent admission that was thought to be secondary to Flexeril and gabapentin.  This was discontinued. CT scan of the brain without any acute findings. EEG without evidence of seizure. Lumbar puncture without evidence of infection.  Currently monitoring one-to-one.  Holding off all medications including any sedations. Followed by neurology.  On high-dose thiamine. Fall precautions.  Delirium precautions.  Hypovolemic shock, AKI, anion gap metabolic acidosis and rhabdomyolysis: Treated with aggressive fluid resuscitation.  Urine output is adequate. Creatinine 7.7-1.87 today.   On maintenance IV fluids.  Will continue.  Other electrolytes are adequate.  Replacing potassium. CK levels 3600-5700-800 now.  Improving.  Blood pressures are adequate.  Soft tissue injuries, nasal bone fracture: Supportive care.  Transaminitis and leukocytosis: Probably due to  shock liver.  LFTs appropriately improving.  Agitation and aggression: Patient probably has underlying mental health issues.  Will consult psychiatry once patient is more interactive and able to have interaction.   DVT prophylaxis: heparin injection 5,000 Units Start: 08/04/23 2200 SCDs Start: 08/03/23 1049   Code Status: Full code Family Communication: None at the bedside Disposition Plan: Status is: Inpatient Remains inpatient appropriate because: Significant abnormal electrolytes, altered mental status     Consultants:  Critical care Neurology Neurology  Procedures:  Lumbar puncture  Antimicrobials:  Completed     Objective: Vitals:   08/05/23 2256 08/06/23 0400 08/06/23 0500 08/06/23 0700  BP: (!) 148/89 139/89  (!) 144/86  Pulse:  (!) 102  99  Resp:  20  (!) 21  Temp: 99.6 F (37.6 C) 99.2 F (37.3 C)  99.4 F (37.4 C)  TempSrc: Axillary Oral  Axillary  SpO2:  97%  97%  Weight:   83.4 kg   Height:        Intake/Output Summary (Last 24 hours) at 08/06/2023 1358 Last data filed at 08/06/2023 1216 Gross per 24 hour  Intake 620 ml  Output 2650 ml  Net -2030 ml   Filed Weights   08/04/23 0350 08/05/23 0328 08/06/23 0500  Weight: 85.4 kg 87.3 kg 83.4 kg    Examination:  General exam: Appears chronically sick.  Comfortably sleepy.  No spontaneous movement. Respiratory system: Clear to auscultation. Respiratory effort normal.  No added sounds. Soft tissue injuries both upper eyelids.  Bruise over right groin, lower quadrant abdomen. Cardiovascular system: S1 & S2 heard, RRR.  Gastrointestinal system: Ostomy with loose stool present.   Central nervous system:  Hardly awakens.   Looks comfortable.  Does not follow commands.  On room air.  Withdraws extremities.   Data Reviewed: I have personally reviewed following labs and imaging studies  CBC: Recent Labs  Lab 08/03/23 0750 08/03/23 2013 08/04/23 0430 08/05/23 0436 08/06/23 0837  WBC 31.7*  --  14.3* 7.9 6.8  NEUTROABS 28.5*  --   --   --   --   HGB 14.4 11.9* 11.8* 10.5* 10.9*  HCT 41.2 35.0* 33.0* 29.7* 31.8*  MCV 80.2  --  81.3 81.4 82.0  PLT 478*  --  279 277 283   Basic Metabolic Panel: Recent Labs  Lab 08/03/23 0750 08/03/23 2013 08/04/23 0430 08/05/23 0436 08/06/23 0837  NA 136 138 140 142 137  K 4.1 3.9 4.0 3.3* 3.3*  CL 87*  --  97* 104 99  CO2 18*  --  23 25 26   GLUCOSE 96  --  75 100* 99  BUN 111*  --  90* 66* 38*  CREATININE 7.76*  --  4.41* 2.69* 1.87*  CALCIUM 10.0  --  8.7* 9.0 9.4  MG  --   --   --   --  2.0  PHOS  --   --   --   --  2.6   GFR: Estimated Creatinine Clearance: 60.4 mL/min (A) (by C-G formula based on SCr of 1.87 mg/dL (H)). Liver Function Tests: Recent Labs  Lab 08/03/23 0750 08/04/23 0430 08/05/23 0436 08/06/23 0837  AST 401* 179* 109* 74*  ALT 566* 284* 195* 151*  ALKPHOS 156* 90 86 88  BILITOT 3.5* 1.9* 1.1 1.1  PROT 11.1* 7.0 6.8 7.9  ALBUMIN 5.1* 3.2* 2.8* 3.2*   No results for input(s): "LIPASE", "AMYLASE" in the last 168 hours. Recent Labs  Lab 08/03/23 1313  AMMONIA 14   Coagulation Profile: No results for input(s): "INR", "PROTIME" in the last 168 hours. Cardiac Enzymes: Recent Labs  Lab 08/03/23 1021 08/04/23 0430 08/05/23 0436 08/06/23 0837  CKTOTAL 3,647* 5,371* 2,341* 817*   BNP (last 3 results) No results for input(s): "PROBNP" in the last 8760 hours. HbA1C: No results for input(s): "HGBA1C" in the last 72 hours. CBG: Recent Labs  Lab 08/05/23 1513 08/05/23 1923 08/05/23 2246 08/06/23 0402 08/06/23 0719  GLUCAP 96 100* 100* 92 95   Lipid Profile: No results for input(s): "CHOL", "HDL", "LDLCALC", "TRIG",  "CHOLHDL", "LDLDIRECT" in the last 72 hours. Thyroid Function Tests: No results for input(s): "TSH", "T4TOTAL", "FREET4", "T3FREE", "THYROIDAB" in the last 72 hours. Anemia Panel: Recent Labs    08/03/23 2033  FOLATE 21.4   Sepsis Labs: Recent Labs  Lab 08/03/23 1313 08/04/23 0853 08/05/23 0925  LATICACIDVEN 3.9* 1.7 1.0    Recent Results (from the past 240 hours)  Culture, blood (Routine X 2) w Reflex to ID Panel     Status: None (Preliminary result)   Collection Time: 08/03/23  1:13 PM   Specimen: BLOOD LEFT ARM  Result Value Ref Range Status   Specimen Description BLOOD LEFT ARM  Final   Special Requests   Final    BOTTLES DRAWN AEROBIC AND ANAEROBIC Blood Culture adequate volume   Culture   Final    NO GROWTH 3 DAYS Performed at [facility], [address], [place], [place] [address]    Report Status PENDING  Incomplete  Culture, blood (Routine X 2) w Reflex to ID Panel     Status: None (Preliminary result)   Collection Time: 08/03/23  1:13 PM   Specimen: BLOOD LEFT HAND  Result Value Ref Range Status   Specimen Description BLOOD LEFT HAND  Final   Special Requests   Final    BOTTLES DRAWN AEROBIC AND ANAEROBIC Blood Culture adequate volume   Culture   Final    NO GROWTH 3 DAYS Performed at [facility], [address], [place], [place] [address]    Report Status PENDING  Incomplete  MRSA Next Gen by PCR, Nasal     Status: None   Collection Time: 08/03/23  7:26 PM   Specimen: Nasal Mucosa; Nasal Swab  Result Value Ref Range Status   MRSA by PCR Next Gen NOT DETECTED NOT DETECTED Final    Comment: (NOTE) The GeneXpert MRSA Assay (FDA approved for NASAL specimens only), is one component of a comprehensive MRSA colonization surveillance program. It is not intended to diagnose MRSA infection nor to guide or monitor treatment for MRSA infections. Test performance is not FDA approved in patients less than 18 years old. Performed at [facility]  Lab, [address] [place], [place] [address]   CSF culture w Gram Stain     Status: None (Preliminary result)   Collection Time: 08/04/23  4:36 PM   Specimen: PATH Cytology CSF; Cerebrospinal Fluid  Result Value Ref Range Status   Specimen Description CSF  Final   Special Requests NONE  Final   Gram Stain WBC SEEN NO ORGANISMS SEEN CYTOSPIN SMEAR   Final   Culture   Final    NO GROWTH 2 DAYS Performed at [facility], [address] [place], [place] [address]    Report Status PENDING  Incomplete  Culture, Fungus without Smear     Status: None (Preliminary result)   Collection Time: 08/04/23  4:36 PM   Specimen: PATH Cytology CSF; Cerebrospinal Fluid  Result Value Ref Range Status   Specimen Description CSF  Final   Special Requests NONE  Final   Culture   Final    NO FUNGUS ISOLATED AFTER 2 DAYS Performed at [facility], [address], [place], [place] [address]    Report Status PENDING  Incomplete         Radiology Studies: DG FL GUIDED LUMBAR PUNCTURE Result Date: 08/04/2023 CLINICAL DATA:  33 year old male with acute encephalopathy of unknown origin. Request for lumbar puncture. EXAM: LUMBAR PUNCTURE UNDER FLUOROSCOPY PROCEDURE: An appropriate skin entry site was determined fluoroscopically. Operator donned sterile gloves and mask. Skin site was marked, then prepped with Betadine, draped in usual sterile fashion, and infiltrated locally with 1% lidocaine. A 20 gauge spinal needle advanced into the thecal sac at L4-5 from a right interlaminar approach. Clear colorless CSF spontaneously returned, with opening pressure of 18 cm water. 10.5 ml CSF were collected and divided among 4 sterile vials for the requested laboratory studies. The needle was then removed. The patient tolerated the procedure well and there were no complications. FLUOROSCOPY: Radiation Exposure Index (as provided by the fluoroscopic device): 3.6 mGy Kerma IMPRESSION: Technically successful lumbar  puncture under fluoroscopy. This exam was performed by Estella Helling, PA-C, and was supervised and interpreted by Dr. Terrence Ferron. Electronically Signed   By: Erica Hau M.D.   On: 08/04/2023 17:05  Scheduled Meds:  Chlorhexidine Gluconate Cloth  6 each Topical Daily   heparin injection (subcutaneous)  5,000 Units Subcutaneous Q8H   mupirocin ointment   Topical Daily   mouth rinse  15 mL Mouth Rinse 4 times per day   [START ON 08/11/2023] thiamine (VITAMIN B1) injection  100 mg Intravenous Daily   Continuous Infusions:  thiamine (VITAMIN B1) injection 250 mg (08/06/23 0857)     LOS: 3 days    Time spent: 55 minutes    Vada Garibaldi, MD [facility]

## 2023-08-06 NOTE — Plan of Care (Signed)
  Problem: Safety: Goal: Violent Restraint(s) 08/06/2023 0105 by Alejos Husband, RN Outcome: Progressing 08/05/2023 2102 by Alejos Husband, RN Outcome: Progressing   Problem: Education: Goal: Knowledge of General Education information will improve Description: Including pain rating scale, medication(s)/side effects and non-pharmacologic comfort measures 08/06/2023 0105 by Alejos Husband, RN Outcome: Progressing 08/05/2023 2102 by Alejos Husband, RN Outcome: Progressing   Problem: Health Behavior/Discharge Planning: Goal: Ability to manage health-related needs will improve 08/06/2023 0105 by Alejos Husband, RN Outcome: Progressing 08/05/2023 2102 by Alejos Husband, RN Outcome: Progressing   Problem: Clinical Measurements: Goal: Ability to maintain clinical measurements within normal limits will improve 08/06/2023 0105 by Alejos Husband, RN Outcome: Progressing 08/05/2023 2102 by Alejos Husband, RN Outcome: Progressing Goal: Will remain free from infection 08/06/2023 0105 by Alejos Husband, RN Outcome: Progressing 08/05/2023 2102 by Alejos Husband, RN Outcome: Progressing Goal: Diagnostic test results will improve 08/06/2023 0105 by Alejos Husband, RN Outcome: Progressing 08/05/2023 2102 by Alejos Husband, RN Outcome: Progressing Goal: Respiratory complications will improve 08/06/2023 0105 by Alejos Husband, RN Outcome: Progressing 08/05/2023 2102 by Alejos Husband, RN Outcome: Progressing Goal: Cardiovascular complication will be avoided 08/06/2023 0105 by Alejos Husband, RN Outcome: Progressing 08/05/2023 2102 by Alejos Husband, RN Outcome: Progressing   Problem: Activity: Goal: Risk for activity intolerance will decrease 08/06/2023 0105 by Alejos Husband, RN Outcome: Progressing 08/05/2023 2102 by Alejos Husband, RN Outcome: Progressing   Problem: Nutrition: Goal: Adequate nutrition will be  maintained 08/06/2023 0105 by Alejos Husband, RN Outcome: Progressing 08/05/2023 2102 by Alejos Husband, RN Outcome: Progressing   Problem: Coping: Goal: Level of anxiety will decrease 08/06/2023 0105 by Alejos Husband, RN Outcome: Progressing 08/05/2023 2102 by Alejos Husband, RN Outcome: Progressing   Problem: Elimination: Goal: Will not experience complications related to bowel motility 08/06/2023 0105 by Alejos Husband, RN Outcome: Progressing 08/05/2023 2102 by Alejos Husband, RN Outcome: Progressing Goal: Will not experience complications related to urinary retention 08/06/2023 0105 by Alejos Husband, RN Outcome: Progressing 08/05/2023 2102 by Alejos Husband, RN Outcome: Progressing   Problem: Pain Managment: Goal: General experience of comfort will improve and/or be controlled 08/06/2023 0105 by Alejos Husband, RN Outcome: Progressing 08/05/2023 2102 by Alejos Husband, RN Outcome: Progressing   Problem: Safety: Goal: Ability to remain free from injury will improve 08/06/2023 0105 by Alejos Husband, RN Outcome: Progressing 08/05/2023 2102 by Alejos Husband, RN Outcome: Progressing   Problem: Skin Integrity: Goal: Risk for impaired skin integrity will decrease 08/06/2023 0105 by Alejos Husband, RN Outcome: Progressing 08/05/2023 2102 by Alejos Husband, RN Outcome: Progressing

## 2023-08-06 NOTE — Progress Notes (Signed)
 NEUROLOGY CONSULT FOLLOW UP NOTE   Date of service: August 06, 2023 Patient Name: PARNELL SPIELER MRN:  [id number] DOB:  1990/10/25  Interval Hx/subjective   He is slightly easier to wake up, but still encephalopathic  Vitals   Vitals:   08/05/23 2256 08/06/23 0400 08/06/23 0500 08/06/23 0700  BP: (!) 148/89 139/89  (!) 144/86  Pulse:  (!) 102  99  Resp:  20  (!) 21  Temp: 99.6 F (37.6 C) 99.2 F (37.3 C)  99.4 F (37.4 C)  TempSrc: Axillary Oral  Axillary  SpO2:  97%  97%  Weight:   83.4 kg   Height:         Body mass index is 25.64 kg/m.  Physical Exam  General In bed, sleeping  Neurologic Examination    MS: He continues to be lethargic, but on awakening, he is able to tell me his name, knows that he is at [facility].  He is able to follow commands readily. CN: Blinks to eyelid stimulation, pupils are reactive face is symmetric, visual fields are full Motor: Moves all extremities to command Sensory: Endorses symmetric sensation  Medications  Current Facility-Administered Medications:    acetaminophen (TYLENOL) tablet 650 mg, 650 mg, Oral, Q4H PRN, Mannam, Praveen, MD, 650 mg at 08/05/23 1725   Chlorhexidine Gluconate Cloth 2 % PADS 6 each, 6 each, Topical, Daily, Hadley Leu, NP, 6 each at 08/06/23 0849   docusate sodium (COLACE) capsule 100 mg, 100 mg, Oral, BID PRN, Babcock, Peter E, NP   heparin injection 5,000 Units, 5,000 Units, Subcutaneous, Q8H, Babcock, Peter E, NP, 5,000 Units at 08/06/23 0640   LORazepam (ATIVAN) injection 1 mg, 1 mg, Intravenous, Q6H PRN, Babcock, Peter E, NP   mupirocin ointment (BACTROBAN) 2 %, , Topical, Daily, Hadley Leu, NP, Given at 08/06/23 [phone]   Oral care mouth rinse, 15 mL, Mouth Rinse, 4 times per day, Hadley Leu, NP, 15 mL at 08/06/23 0846   Oral care mouth rinse, 15 mL, Mouth Rinse, PRN, Babcock, Peter E, NP   polyethylene glycol (MIRALAX / GLYCOLAX) packet 17 g, 17 g, Oral, Daily PRN, Babcock, Peter E,  NP   QUEtiapine (SEROQUEL) tablet 100 mg, 100 mg, Oral, BID PRN, Babcock, Peter E, NP, 100 mg at 08/05/23 2248   [COMPLETED] thiamine (VITAMIN B1) 500 mg in sodium chloride 0.9 % 50 mL IVPB, 500 mg, Intravenous, Q8H, Last Rate: 110 mL/hr at 08/05/23 1348, Infusion Verify at 08/05/23 1348 **FOLLOWED BY** thiamine (VITAMIN B1) 250 mg in sodium chloride 0.9 % 50 mL IVPB, 250 mg, Intravenous, Daily, Last Rate: 105 mL/hr at 08/06/23 0857, 250 mg at 08/06/23 0857 **FOLLOWED BY** [START ON 08/11/2023] thiamine (VITAMIN B1) injection 100 mg, 100 mg, Intravenous, Daily, Hadley Leu, NP  Labs and Diagnostic Imaging   BUN has improved to 38  Imaging(Personally reviewed): CT head is negative  Assessment   Lucille Crichlow Korn is a 33 y.o. male with severe encephalopathy who initially had some concerns for seizures, but with negative EEG I think that this is less likely.    With his improvement, I think we can hold off on imaging, given that he is improving as his metabolic issuesimprove and he has no focal findings.    Recommendations  Continue to treat underlying metabolic issues.  I would expect his mental status to improve gradually, trailing behind the improvement in his numbers. Could consider MRI only if he does not continue to improve as expected  Neurological be available on an as-needed basis, please call with further questions or concerns. ______________________________________________________________________   Flint Hummer, MD [facility]

## 2023-08-06 NOTE — Plan of Care (Signed)
  Problem: Clinical Measurements: Goal: Will remain free from infection Outcome: Progressing Goal: Diagnostic test results will improve Outcome: Progressing Goal: Respiratory complications will improve Outcome: Progressing Goal: Cardiovascular complication will be avoided Outcome: Progressing   Problem: Activity: Goal: Risk for activity intolerance will decrease Outcome: Progressing   Problem: Coping: Goal: Level of anxiety will decrease Outcome: Progressing   Problem: Elimination: Goal: Will not experience complications related to bowel motility Outcome: Progressing Goal: Will not experience complications related to urinary retention Outcome: Progressing   Problem: Pain Managment: Goal: General experience of comfort will improve and/or be controlled Outcome: Progressing   Problem: Safety: Goal: Ability to remain free from injury will improve Outcome: Progressing

## 2023-08-07 ENCOUNTER — Inpatient Hospital Stay (HOSPITAL_COMMUNITY)

## 2023-08-07 DIAGNOSIS — M7989 Other specified soft tissue disorders: Secondary | ICD-10-CM

## 2023-08-07 DIAGNOSIS — G9340 Encephalopathy, unspecified: Secondary | ICD-10-CM | POA: Diagnosis not present

## 2023-08-07 LAB — CBC
HCT: 37.6 % — ABNORMAL LOW (ref 39.0–52.0)
Hemoglobin: 12.8 g/dL — ABNORMAL LOW (ref 13.0–17.0)
MCH: 28 pg (ref 26.0–34.0)
MCHC: 34 g/dL (ref 30.0–36.0)
MCV: 82.3 fL (ref 80.0–100.0)
Platelets: 328 10*3/uL (ref 150–400)
RBC: 4.57 MIL/uL (ref 4.22–5.81)
RDW: 15 % (ref 11.5–15.5)
WBC: 9.2 10*3/uL (ref 4.0–10.5)
nRBC: 0 % (ref 0.0–0.2)

## 2023-08-07 LAB — GLUCOSE, CAPILLARY
Glucose-Capillary: 101 mg/dL — ABNORMAL HIGH (ref 70–99)
Glucose-Capillary: 105 mg/dL — ABNORMAL HIGH (ref 70–99)
Glucose-Capillary: 106 mg/dL — ABNORMAL HIGH (ref 70–99)
Glucose-Capillary: 110 mg/dL — ABNORMAL HIGH (ref 70–99)
Glucose-Capillary: 111 mg/dL — ABNORMAL HIGH (ref 70–99)
Glucose-Capillary: 88 mg/dL (ref 70–99)

## 2023-08-07 LAB — COMPREHENSIVE METABOLIC PANEL WITH GFR
ALT: 141 U/L — ABNORMAL HIGH (ref 0–44)
AST: 76 U/L — ABNORMAL HIGH (ref 15–41)
Albumin: 3.5 g/dL (ref 3.5–5.0)
Alkaline Phosphatase: 94 U/L (ref 38–126)
Anion gap: 13 (ref 5–15)
BUN: 25 mg/dL — ABNORMAL HIGH (ref 6–20)
CO2: 25 mmol/L (ref 22–32)
Calcium: 10 mg/dL (ref 8.9–10.3)
Chloride: 98 mmol/L (ref 98–111)
Creatinine, Ser: 1.71 mg/dL — ABNORMAL HIGH (ref 0.61–1.24)
GFR, Estimated: 54 mL/min — ABNORMAL LOW (ref >60)
Glucose, Bld: 101 mg/dL — ABNORMAL HIGH (ref 70–99)
Potassium: 4.1 mmol/L (ref 3.5–5.1)
Sodium: 136 mmol/L (ref 135–145)
Total Bilirubin: 1.1 mg/dL (ref 0.0–1.2)
Total Protein: 8.9 g/dL — ABNORMAL HIGH (ref 6.5–8.1)

## 2023-08-07 LAB — MAGNESIUM: Magnesium: 1.8 mg/dL (ref 1.7–2.4)

## 2023-08-07 LAB — CYTOLOGY - NON PAP

## 2023-08-07 MED ORDER — ACETAMINOPHEN 650 MG RE SUPP
650.0000 mg | Freq: Once | RECTAL | Status: AC
  Administered 2023-08-07: 650 mg via RECTAL
  Filled 2023-08-07: qty 1

## 2023-08-07 MED ORDER — DEXTROSE 5 % IV SOLN: INTRAVENOUS | Status: DC

## 2023-08-07 NOTE — Plan of Care (Signed)
  Problem: Safety: Goal: Violent Restraint(s) 08/07/2023 0053 by Alejos Husband, RN Outcome: Progressing 08/06/2023 2008 by Alejos Husband, RN Outcome: Progressing   Problem: Education: Goal: Knowledge of General Education information will improve Description: Including pain rating scale, medication(s)/side effects and non-pharmacologic comfort measures 08/07/2023 0053 by Alejos Husband, RN Outcome: Progressing 08/06/2023 2008 by Alejos Husband, RN Outcome: Progressing   Problem: Health Behavior/Discharge Planning: Goal: Ability to manage health-related needs will improve 08/07/2023 0053 by Alejos Husband, RN Outcome: Progressing 08/06/2023 2008 by Alejos Husband, RN Outcome: Progressing   Problem: Clinical Measurements: Goal: Ability to maintain clinical measurements within normal limits will improve 08/07/2023 0053 by Alejos Husband, RN Outcome: Progressing 08/06/2023 2008 by Alejos Husband, RN Outcome: Progressing Goal: Will remain free from infection 08/07/2023 0053 by Alejos Husband, RN Outcome: Progressing 08/06/2023 2008 by Alejos Husband, RN Outcome: Progressing Goal: Diagnostic test results will improve 08/07/2023 0053 by Alejos Husband, RN Outcome: Progressing 08/06/2023 2008 by Alejos Husband, RN Outcome: Progressing Goal: Respiratory complications will improve 08/07/2023 0053 by Alejos Husband, RN Outcome: Progressing 08/06/2023 2008 by Alejos Husband, RN Outcome: Progressing Goal: Cardiovascular complication will be avoided 08/07/2023 0053 by Alejos Husband, RN Outcome: Progressing 08/06/2023 2008 by Alejos Husband, RN Outcome: Progressing   Problem: Activity: Goal: Risk for activity intolerance will decrease 08/07/2023 0053 by Alejos Husband, RN Outcome: Progressing 08/06/2023 2008 by Alejos Husband, RN Outcome: Progressing   Problem: Nutrition: Goal: Adequate nutrition will be  maintained 08/07/2023 0053 by Alejos Husband, RN Outcome: Progressing 08/06/2023 2008 by Alejos Husband, RN Outcome: Progressing   Problem: Coping: Goal: Level of anxiety will decrease 08/07/2023 0053 by Alejos Husband, RN Outcome: Progressing 08/06/2023 2008 by Alejos Husband, RN Outcome: Progressing   Problem: Elimination: Goal: Will not experience complications related to bowel motility 08/07/2023 0053 by Alejos Husband, RN Outcome: Progressing 08/06/2023 2008 by Alejos Husband, RN Outcome: Progressing Goal: Will not experience complications related to urinary retention 08/07/2023 0053 by Alejos Husband, RN Outcome: Progressing 08/06/2023 2008 by Alejos Husband, RN Outcome: Progressing   Problem: Pain Managment: Goal: General experience of comfort will improve and/or be controlled 08/07/2023 0053 by Alejos Husband, RN Outcome: Progressing 08/06/2023 2008 by Alejos Husband, RN Outcome: Progressing   Problem: Safety: Goal: Ability to remain free from injury will improve 08/07/2023 0053 by Alejos Husband, RN Outcome: Progressing 08/06/2023 2008 by Alejos Husband, RN Outcome: Progressing   Problem: Skin Integrity: Goal: Risk for impaired skin integrity will decrease 08/07/2023 0053 by Alejos Husband, RN Outcome: Progressing 08/06/2023 2008 by Alejos Husband, RN Outcome: Progressing

## 2023-08-07 NOTE — Progress Notes (Signed)
 PROGRESS NOTE    Shane Klein  ZOX:[id number] DOB: 1990-06-10 DOA: 08/03/2023 PCP: Marius Siemens, NP    Brief Narrative:  33 year old with history of Crohn's disease with small bowel obstruction status post ileostomy presented [facility] emergency room from jail with altered mental status and hypotension.  Recent admission with similar presentation with altered mental status, elevated LFTs, AKI and rhabdomyolysis which was thought to be from polypharmacy.  He was discharged with normal condition.  On arrival, CT head and EEG were negative.  Patient was hypotensive and altered.  He was admitted to the ICU and treated with IV fluid and vasopressors.  Patient was found with creatinine of 7.7, anion gap 31, BUN 111.  Reportedly he was thrashing around and bumping around, hitting his head in the jail. In the emergency room, hypotensive, altered.  Required Levophed and aggressive fluid resuscitation.  Seen by nephrology.  Admitted to ICU.  Also seen by neurology. 4/12, transferred out of ICU.  Still altered. 4/13, waking up but is still not very interactive.  Declines medications.  Subjective: Patient seen and examined.  Awake and interactive with basic commands today.  Does not want to talk.  Follows commands and moves all extremities.  Staff helps him to eat and he will spit out.  He is on hand cuffs and ankle cuffs.  Patient has a low Engineer, manufacturing systems at the bedside and also medical staff one-to-one at bedside.  No other overnight events.  Declines most of the medications.  Urinating well.  Renal functions improving.   Assessment & Plan:   Acute encephalopathy: Suspected secondary to polypharmacy and also with uremia.  Recent admission that was thought to be secondary to Flexeril and gabapentin.  This was discontinued. CT scan of the brain without any acute findings. EEG without evidence of seizure. Lumbar puncture without evidence of infection.  Currently monitoring one-to-one.  Holding off  all medications including any sedations.  Seroquel as needed for agitation. Followed by neurology.  On high-dose thiamine. Fall precautions.  Delirium precautions. Some clinical improvement today. Continue one-to-one monitoring.  High risk of injuring himself. Psychiatry consultation today, hopefully he can participate in interview.  Hypovolemic shock, AKI, anion gap metabolic acidosis and rhabdomyolysis: Treated with aggressive fluid resuscitation.  Urine output is adequate. Creatinine 7.7-1.87-1.7 Today.  On maintenance IV fluids.  Will continue.  Other electrolytes are adequate.  Replacing potassium. CK levels 3600-5700-800 now.  Improving.  Blood pressures are adequate. Since patient is declining to eat, will continue IV fluids to avoid risk of renal failure.  Soft tissue injuries, nasal bone fracture: Supportive care.  Transaminitis and leukocytosis: Probably due to  shock liver.  LFTs appropriately improving.  Agitation and aggression: Patient probably has underlying mental health issues.  Will consult psychiatry once patient is more interactive and able to have interaction.   DVT prophylaxis: heparin injection 5,000 Units Start: 08/04/23 2200 SCDs Start: 08/03/23 1049   Code Status: Full code Family Communication: None at the bedside Disposition Plan: Status is: Inpatient Remains inpatient appropriate because: Significant abnormal electrolytes, altered mental status     Consultants:  Critical care Neurology Neurology Psychiatry  Procedures:  Lumbar puncture  Antimicrobials:  Completed     Objective: Vitals:   08/07/23 0300 08/07/23 0500 08/07/23 0616 08/07/23 0700  BP: (!) 140/76   128/86  Pulse: 87  93 89  Resp: 13  10 12   Temp: 99.7 F (37.6 C)  99.8 F (37.7 C) 98.6 F (37 C)  TempSrc: Axillary  Axillary Axillary  SpO2: 99%  98% 99%  Weight:  83.4 kg    Height:        Intake/Output Summary (Last 24 hours) at 08/07/2023 1105 Last data filed at  08/07/2023 0830 Gross per 24 hour  Intake 2073.92 ml  Output 3900 ml  Net -1826.08 ml   Filed Weights   08/05/23 0328 08/06/23 0500 08/07/23 0500  Weight: 87.3 kg 83.4 kg 83.4 kg    Examination:  General exam: Appears chronically sick.  Alert awake but not keen to interaction.  Looks fairly comfortable today. Respiratory system: Clear to auscultation. Respiratory effort normal.  No added sounds. Soft tissue injuries both upper eyelids.  Bruise over right groin, lower quadrant abdomen. Cardiovascular system: S1 & S2 heard, RRR.  Gastrointestinal system: Ostomy with loose stool present.   Central nervous system:  Alert and awake.  Flat affect.  Does not want to interact.  Follows commands.  Looks comfortable.  On room air.     Data Reviewed: I have personally reviewed following labs and imaging studies  CBC: Recent Labs  Lab 08/03/23 0750 08/03/23 2013 08/04/23 0430 08/05/23 0436 08/06/23 0837 08/07/23 0959  WBC 31.7*  --  14.3* 7.9 6.8 9.2  NEUTROABS 28.5*  --   --   --   --   --   HGB 14.4 11.9* 11.8* 10.5* 10.9* 12.8*  HCT 41.2 35.0* 33.0* 29.7* 31.8* 37.6*  MCV 80.2  --  81.3 81.4 82.0 82.3  PLT 478*  --  279 277 283 328   Basic Metabolic Panel: Recent Labs  Lab 08/03/23 0750 08/03/23 2013 08/04/23 0430 08/05/23 0436 08/06/23 0837 08/07/23 0959  NA 136 138 140 142 137 136  K 4.1 3.9 4.0 3.3* 3.3* 4.1  CL 87*  --  97* 104 99 98  CO2 18*  --  23 25 26 25   GLUCOSE 96  --  75 100* 99 101*  BUN 111*  --  90* 66* 38* 25*  CREATININE 7.76*  --  4.41* 2.69* 1.87* 1.71*  CALCIUM 10.0  --  8.7* 9.0 9.4 10.0  MG  --   --   --   --  2.0 1.8  PHOS  --   --   --   --  2.6  --    GFR: Estimated Creatinine Clearance: 66.1 mL/min (A) (by C-G formula based on SCr of 1.71 mg/dL (H)). Liver Function Tests: Recent Labs  Lab 08/03/23 0750 08/04/23 0430 08/05/23 0436 08/06/23 0837 08/07/23 0959  AST 401* 179* 109* 74* 76*  ALT 566* 284* 195* 151* 141*  ALKPHOS 156*  90 86 88 94  BILITOT 3.5* 1.9* 1.1 1.1 1.1  PROT 11.1* 7.0 6.8 7.9 8.9*  ALBUMIN 5.1* 3.2* 2.8* 3.2* 3.5   No results for input(s): "LIPASE", "AMYLASE" in the last 168 hours. Recent Labs  Lab 08/03/23 1313  AMMONIA 14   Coagulation Profile: No results for input(s): "INR", "PROTIME" in the last 168 hours. Cardiac Enzymes: Recent Labs  Lab 08/03/23 1021 08/04/23 0430 08/05/23 0436 08/06/23 0837  CKTOTAL 3,647* 5,371* 2,341* 817*   BNP (last 3 results) No results for input(s): "PROBNP" in the last 8760 hours. HbA1C: No results for input(s): "HGBA1C" in the last 72 hours. CBG: Recent Labs  Lab 08/06/23 1723 08/06/23 2008 08/06/23 2325 08/07/23 0313 08/07/23 0742  GLUCAP 124* 115* 107* 101* 106*   Lipid Profile: No results for input(s): "CHOL", "HDL", "LDLCALC", "TRIG", "CHOLHDL", "LDLDIRECT" in the last 72 hours. Thyroid  Function Tests: No results for input(s): "TSH", "T4TOTAL", "FREET4", "T3FREE", "THYROIDAB" in the last 72 hours. Anemia Panel: No results for input(s): "VITAMINB12", "FOLATE", "FERRITIN", "TIBC", "IRON", "RETICCTPCT" in the last 72 hours.  Sepsis Labs: Recent Labs  Lab 08/03/23 1313 08/04/23 0853 08/05/23 0925  LATICACIDVEN 3.9* 1.7 1.0    Recent Results (from the past 240 hours)  Culture, blood (Routine X 2) w Reflex to ID Panel     Status: None (Preliminary result)   Collection Time: 08/03/23  1:13 PM   Specimen: BLOOD LEFT ARM  Result Value Ref Range Status   Specimen Description BLOOD LEFT ARM  Final   Special Requests   Final    BOTTLES DRAWN AEROBIC AND ANAEROBIC Blood Culture adequate volume   Culture   Final    NO GROWTH 4 DAYS Performed at [facility], [address] [place], [place] [address]    Report Status PENDING  Incomplete  Culture, blood (Routine X 2) w Reflex to ID Panel     Status: None (Preliminary result)   Collection Time: 08/03/23  1:13 PM   Specimen: BLOOD LEFT HAND  Result Value Ref Range Status    Specimen Description BLOOD LEFT HAND  Final   Special Requests   Final    BOTTLES DRAWN AEROBIC AND ANAEROBIC Blood Culture adequate volume   Culture   Final    NO GROWTH 4 DAYS Performed at [facility], [address] [place], [place] [address]    Report Status PENDING  Incomplete  MRSA Next Gen by PCR, Nasal     Status: None   Collection Time: 08/03/23  7:26 PM   Specimen: Nasal Mucosa; Nasal Swab  Result Value Ref Range Status   MRSA by PCR Next Gen NOT DETECTED NOT DETECTED Final    Comment: (NOTE) The GeneXpert MRSA Assay (FDA approved for NASAL specimens only), is one component of a comprehensive MRSA colonization surveillance program. It is not intended to diagnose MRSA infection nor to guide or monitor treatment for MRSA infections. Test performance is not FDA approved in patients less than 70 years old. Performed at [facility], [address], [place], [place] [address]   CSF culture w Gram Stain     Status: None (Preliminary result)   Collection Time: 08/04/23  4:36 PM   Specimen: PATH Cytology CSF; Cerebrospinal Fluid  Result Value Ref Range Status   Specimen Description CSF  Final   Special Requests NONE  Final   Gram Stain WBC SEEN NO ORGANISMS SEEN CYTOSPIN SMEAR   Final   Culture   Final    NO GROWTH 2 DAYS Performed at [facility], [address], [place], [place] [address]    Report Status PENDING  Incomplete  Culture, Fungus without Smear     Status: None (Preliminary result)   Collection Time: 08/04/23  4:36 PM   Specimen: PATH Cytology CSF; Cerebrospinal Fluid  Result Value Ref Range Status   Specimen Description CSF  Final   Special Requests NONE  Final   Culture   Final    NO FUNGUS ISOLATED AFTER 2 DAYS Performed at [facility], [address], [place], [place] [address]    Report Status PENDING  Incomplete         Radiology Studies: VAS US  UPPER EXTREMITY VENOUS DUPLEX Result Date: 08/07/2023 UPPER VENOUS  STUDY  Patient Name:  DELMONT PROSCH  Date of Exam:   08/07/2023 Medical  Rec #: [id number]         Accession #:    [id number] Date of Birth: [birth year]         Patient Gender: M Patient Age:   32 years Exam Location:  [facility] Procedure:      VAS US  UPPER EXTREMITY VENOUS DUPLEX Referring Phys: Phyllis Breeze --------------------------------------------------------------------------------  Indications: Swelling Other Indications: History of Crohn's disease, small bowel obstruction, ileostomy. Comparison Study: No priors. Performing Technologist: Franky Ivanoff Sturdivant-Jones RDMS, RVT  Examination Guidelines: A complete evaluation includes B-mode imaging, spectral Doppler, color Doppler, and power Doppler as needed of all accessible portions of each vessel. Bilateral testing is considered an integral part of a complete examination. Limited examinations for reoccurring indications may be performed as noted.  Right Findings: +----------+------------+---------+-----------+----------+-------+ RIGHT     CompressiblePhasicitySpontaneousPropertiesSummary +----------+------------+---------+-----------+----------+-------+ IJV           Full       Yes       Yes                      +----------+------------+---------+-----------+----------+-------+ Subclavian               Yes       Yes                      +----------+------------+---------+-----------+----------+-------+ Axillary      Full       Yes       Yes                      +----------+------------+---------+-----------+----------+-------+ Brachial      Full                                          +----------+------------+---------+-----------+----------+-------+ Radial        Full                                          +----------+------------+---------+-----------+----------+-------+ Ulnar         Full                                          +----------+------------+---------+-----------+----------+-------+  Cephalic      Full                                          +----------+------------+---------+-----------+----------+-------+ Basilic       Full                                          +----------+------------+---------+-----------+----------+-------+  Left Findings: +----------+------------+---------+-----------+----------+-------+ LEFT      CompressiblePhasicitySpontaneousPropertiesSummary +----------+------------+---------+-----------+----------+-------+ Subclavian               Yes       Yes                      +----------+------------+---------+-----------+----------+-------+  Summary:  Right: No evidence of deep vein thrombosis in the upper extremity.  No evidence of superficial vein thrombosis in the upper extremity.  Left: No evidence of thrombosis in the subclavian.  *See table(s) above for measurements and observations.    Preliminary         Scheduled Meds:  Chlorhexidine Gluconate Cloth  6 each Topical Daily   heparin injection (subcutaneous)  5,000 Units Subcutaneous Q8H   mupirocin ointment   Topical Daily   mouth rinse  15 mL Mouth Rinse 4 times per day   potassium chloride  40 mEq Oral BID   [START ON 08/11/2023] thiamine (VITAMIN B1) injection  100 mg Intravenous Daily   Continuous Infusions:  dextrose     thiamine (VITAMIN B1) injection 250 mg (08/07/23 0913)     LOS: 4 days    Time spent: 55 minutes    Vada Garibaldi, MD [facility]
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
## 2023-08-07 NOTE — Plan of Care (Signed)
 Patient visibly more awake but still flat affect. Will answer yes or nor questions. Refusing to eat meals. Tolerates some liquids but pockets food and spits it out after chewing. Refuses PO medications.   Problem: Clinical Measurements: Goal: Ability to maintain clinical measurements within normal limits will improve Outcome: Progressing Goal: Will remain free from infection Outcome: Progressing Goal: Diagnostic test results will improve Outcome: Progressing Goal: Respiratory complications will improve Outcome: Progressing Goal: Cardiovascular complication will be avoided Outcome: Progressing   Problem: Activity: Goal: Risk for activity intolerance will decrease Outcome: Progressing   Problem: Coping: Goal: Level of anxiety will decrease Outcome: Progressing   Problem: Elimination: Goal: Will not experience complications related to bowel motility Outcome: Progressing Goal: Will not experience complications related to urinary retention Outcome: Progressing   Problem: Pain Managment: Goal: General experience of comfort will improve and/or be controlled Outcome: Progressing   Problem: Safety: Goal: Ability to remain free from injury will improve Outcome: Progressing

## 2023-08-07 NOTE — Progress Notes (Signed)
 Right upper extremity venous  has been completed. Refer to [facility] under chart review to view preliminary results.   08/07/2023  10:23 AM Rory Montel, Hollace Lund  .

## 2023-08-08 DIAGNOSIS — F05 Delirium due to known physiological condition: Secondary | ICD-10-CM | POA: Diagnosis not present

## 2023-08-08 DIAGNOSIS — G9340 Encephalopathy, unspecified: Secondary | ICD-10-CM | POA: Diagnosis not present

## 2023-08-08 LAB — CULTURE, BLOOD (ROUTINE X 2)
Culture: NO GROWTH
Culture: NO GROWTH
Special Requests: ADEQUATE
Special Requests: ADEQUATE

## 2023-08-08 LAB — CSF CULTURE W GRAM STAIN: Culture: NO GROWTH

## 2023-08-08 LAB — VDRL, CSF: VDRL Quant, CSF: NONREACTIVE

## 2023-08-08 LAB — CK: Total CK: 364 U/L (ref 49–397)

## 2023-08-08 LAB — BASIC METABOLIC PANEL WITH GFR
Anion gap: 14 (ref 5–15)
BUN: 23 mg/dL — ABNORMAL HIGH (ref 6–20)
CO2: 25 mmol/L (ref 22–32)
Calcium: 10 mg/dL (ref 8.9–10.3)
Chloride: 96 mmol/L — ABNORMAL LOW (ref 98–111)
Creatinine, Ser: 1.63 mg/dL — ABNORMAL HIGH (ref 0.61–1.24)
GFR, Estimated: 57 mL/min — ABNORMAL LOW (ref >60)
Glucose, Bld: 104 mg/dL — ABNORMAL HIGH (ref 70–99)
Potassium: 4 mmol/L (ref 3.5–5.1)
Sodium: 135 mmol/L (ref 135–145)

## 2023-08-08 LAB — GLUCOSE, CAPILLARY
Glucose-Capillary: 115 mg/dL — ABNORMAL HIGH (ref 70–99)
Glucose-Capillary: 99 mg/dL (ref 70–99)
Glucose-Capillary: 86 mg/dL (ref 70–99)
Glucose-Capillary: 89 mg/dL (ref 70–99)
Glucose-Capillary: 108 mg/dL — ABNORMAL HIGH (ref 70–99)
Glucose-Capillary: 92 mg/dL (ref 70–99)

## 2023-08-08 MED ORDER — THIAMINE MONONITRATE 100 MG PO TABS
100.0000 mg | ORAL_TABLET | Freq: Every day | ORAL | Status: DC
  Administered 2023-08-09 – 2023-08-10 (×2): 100 mg via ORAL
  Filled 2023-08-08 (×2): qty 1

## 2023-08-08 MED ORDER — MIRTAZAPINE 15 MG PO TABS
15.0000 mg | ORAL_TABLET | Freq: Every day | ORAL | Status: DC
  Administered 2023-08-08 – 2023-08-09 (×2): 15 mg via ORAL
  Filled 2023-08-08 (×2): qty 1

## 2023-08-08 NOTE — Progress Notes (Signed)
 PROGRESS NOTE    MURRELL DOME  ZOX:[id number] DOB: 07-03-1990 DOA: 08/03/2023 PCP: [name], NP    Brief Narrative:  33 year old with history of Crohn's disease with small bowel obstruction status post ileostomy presented [facility] emergency room from jail with altered mental status and hypotension.  Recent admission with similar presentation with altered mental status, elevated LFTs, AKI and rhabdomyolysis which was thought to be from polypharmacy.  He was discharged in normal condition.  On arrival, CT head and EEG were negative.  Patient was hypotensive and altered.  He was admitted to the ICU and treated with IV fluid and vasopressors.  Patient was found with creatinine of 7.7, anion gap 31, BUN 111.  Reportedly he was thrashing around and bumping around, hitting his head in the jail. In the emergency room, hypotensive, altered.  Required Levophed and aggressive fluid resuscitation.  Seen by nephrology.  Admitted to ICU.  Also seen by neurology. 4/12, transferred out of ICU.  Still altered. 4/13, waking up but is still not very interactive.  Declines medications. 4/14, more awake but is still declining food.  Subjective: Patient seen and examined.  He is more awake now.  He follows simple commands.  He was able to answer few questions then declines.  Denies any nausea vomiting or abdominal pain. Reiterated that he should be eating to get out of the hospital.  Bedside sitter encouraging him to eat.   Assessment & Plan:   Acute encephalopathy: Suspected secondary to polypharmacy and also with uremia.  Recent admission that was thought to be secondary to Flexeril and gabapentin.  This was discontinued. CT scan of the brain without any acute findings. EEG without evidence of seizure. Lumbar puncture without evidence of infection.  Currently monitoring one-to-one.  Holding off all medications including any sedations.  Seroquel as needed for agitation. Followed by neurology.  On  high-dose thiamine.  Started on oral thiamine today. Fall precautions.  Delirium precautions. Some clinical improvement today. Continue one-to-one monitoring.   Psychiatry consulted.  They recommended discharge him back to the correctional facility with mental health backup. Will put patient back on Remeron 15 mg at night. Mobilize and encourage eating.  Hypovolemic shock, AKI, anion gap metabolic acidosis and rhabdomyolysis: Treated with aggressive fluid resuscitation.  Urine output is adequate. Creatinine 7.7-1.87-1.7-1.6.  CK levels improving and normalized.  Electrolytes are adequate.  Discontinue IV fluids.  Encourage oral intake as above.   Soft tissue injuries, nasal bone fracture: Supportive care.  Transaminitis and leukocytosis: Probably due to  shock liver.  LFTs appropriately improving.  Agitation and aggression: Patient probably has underlying mental health issues.  Psychiatry consulted.  They recommended to follow-up at correctional facility with psychiatry.  Right arm swelling: Probably due to IV infiltration.  Negative for DVT.   Some clinical improvement but no oral intake.  PT OT to mobilize, monitor oral intake to ultimate discharge next 24 to 48 hours.   DVT prophylaxis: heparin injection 5,000 Units Start: 08/04/23 2200 SCDs Start: 08/03/23 1049   Code Status: Full code Family Communication: None at the bedside.  Catering manager and Software engineer. Disposition Plan: Status is: Inpatient Remains inpatient appropriate because: Significant abnormal electrolytes, altered mental status.  Not eating.     Consultants:  Critical care Neurology Neurology Psychiatry  Procedures:  Lumbar puncture  Antimicrobials:  Completed     Objective: Vitals:   08/08/23 0400 08/08/23 0425 08/08/23 0741 08/08/23 0800  BP: (!) 128/92   130/85  Pulse: 89  96  Resp: 18   17  Temp: (!) 100.6 F (38.1 C)  99.2 F (37.3 C)   TempSrc: Oral  Oral   SpO2: 98%    97%  Weight:  83.4 kg    Height:        Intake/Output Summary (Last 24 hours) at 08/08/2023 1104 Last data filed at 08/08/2023 0736 Gross per 24 hour  Intake 2307.3 ml  Output 1925 ml  Net 382.3 ml   Filed Weights   08/06/23 0500 08/07/23 0500 08/08/23 0425  Weight: 83.4 kg 83.4 kg 83.4 kg    Examination:  General exam: Looks comfortable.  Eyes open.  Flat affect.  Answers basic questions in 1 word.  Moves all extremities.  Follows commands. Respiratory system: Clear to auscultation. Respiratory effort normal.  No added sounds. Soft tissue injuries both upper eyelids.  Bruise over right groin, lower quadrant abdomen. Cardiovascular system: S1 & S2 heard, RRR.  Gastrointestinal system: Ostomy with loose stool present.  Multiple previous scars present. Central nervous system:  Alert and awake.  Flat affect.  Does not want to interact.  Follows commands.  Looks comfortable.  On room air.   Minimal swelling of the right arm.   Data Reviewed: I have personally reviewed following labs and imaging studies  CBC: Recent Labs  Lab 08/03/23 0750 08/03/23 2013 08/04/23 0430 08/05/23 0436 08/06/23 0837 08/07/23 0959  WBC 31.7*  --  14.3* 7.9 6.8 9.2  NEUTROABS 28.5*  --   --   --   --   --   HGB 14.4 11.9* 11.8* 10.5* 10.9* 12.8*  HCT 41.2 35.0* 33.0* 29.7* 31.8* 37.6*  MCV 80.2  --  81.3 81.4 82.0 82.3  PLT 478*  --  279 277 283 328   Basic Metabolic Panel: Recent Labs  Lab 08/04/23 0430 08/05/23 0436 08/06/23 0837 08/07/23 0959 08/08/23 0341  NA 140 142 137 136 135  K 4.0 3.3* 3.3* 4.1 4.0  CL 97* 104 99 98 96*  CO2 23 25 26 25 25   GLUCOSE 75 100* 99 101* 104*  BUN 90* 66* 38* 25* 23*  CREATININE 4.41* 2.69* 1.87* 1.71* 1.63*  CALCIUM 8.7* 9.0 9.4 10.0 10.0  MG  --   --  2.0 1.8  --   PHOS  --   --  2.6  --   --    GFR: Estimated Creatinine Clearance: 69.3 mL/min (A) (by C-G formula based on SCr of 1.63 mg/dL (H)). Liver Function Tests: Recent Labs  Lab  08/03/23 0750 08/04/23 0430 08/05/23 0436 08/06/23 0837 08/07/23 0959  AST 401* 179* 109* 74* 76*  ALT 566* 284* 195* 151* 141*  ALKPHOS 156* 90 86 88 94  BILITOT 3.5* 1.9* 1.1 1.1 1.1  PROT 11.1* 7.0 6.8 7.9 8.9*  ALBUMIN 5.1* 3.2* 2.8* 3.2* 3.5   No results for input(s): "LIPASE", "AMYLASE" in the last 168 hours. Recent Labs  Lab 08/03/23 1313  AMMONIA 14   Coagulation Profile: No results for input(s): "INR", "PROTIME" in the last 168 hours. Cardiac Enzymes: Recent Labs  Lab 08/03/23 1021 08/04/23 0430 08/05/23 0436 08/06/23 0837 08/08/23 0341  CKTOTAL 3,647* 5,371* 2,341* 817* 364   BNP (last 3 results) No results for input(s): "PROBNP" in the last 8760 hours. HbA1C: No results for input(s): "HGBA1C" in the last 72 hours. CBG: Recent Labs  Lab 08/07/23 1551 08/07/23 1941 08/07/23 2349 08/08/23 0422 08/08/23 0751  GLUCAP 88 111* 105* 115* 99   Lipid Profile: No results  for input(s): "CHOL", "HDL", "LDLCALC", "TRIG", "CHOLHDL", "LDLDIRECT" in the last 72 hours. Thyroid Function Tests: No results for input(s): "TSH", "T4TOTAL", "FREET4", "T3FREE", "THYROIDAB" in the last 72 hours. Anemia Panel: No results for input(s): "VITAMINB12", "FOLATE", "FERRITIN", "TIBC", "IRON", "RETICCTPCT" in the last 72 hours.  Sepsis Labs: Recent Labs  Lab 08/03/23 1313 08/04/23 0853 08/05/23 0925  LATICACIDVEN 3.9* 1.7 1.0    Recent Results (from the past 240 hours)  Culture, blood (Routine X 2) w Reflex to ID Panel     Status: None   Collection Time: 08/03/23  1:13 PM   Specimen: BLOOD LEFT ARM  Result Value Ref Range Status   Specimen Description BLOOD LEFT ARM  Final   Special Requests   Final    BOTTLES DRAWN AEROBIC AND ANAEROBIC Blood Culture adequate volume   Culture   Final    NO GROWTH 5 DAYS Performed at [facility], [address], [place], [place] [address]    Report Status 08/08/2023 FINAL  Final  Culture, blood (Routine X 2) w Reflex to ID  Panel     Status: None   Collection Time: 08/03/23  1:13 PM   Specimen: BLOOD LEFT HAND  Result Value Ref Range Status   Specimen Description BLOOD LEFT HAND  Final   Special Requests   Final    BOTTLES DRAWN AEROBIC AND ANAEROBIC Blood Culture adequate volume   Culture   Final    NO GROWTH 5 DAYS Performed at [facility], [address], [place], [place] [address]    Report Status 08/08/2023 FINAL  Final  MRSA Next Gen by PCR, Nasal     Status: None   Collection Time: 08/03/23  7:26 PM   Specimen: Nasal Mucosa; Nasal Swab  Result Value Ref Range Status   MRSA by PCR Next Gen NOT DETECTED NOT DETECTED Final    Comment: (NOTE) The GeneXpert MRSA Assay (FDA approved for NASAL specimens only), is one component of a comprehensive MRSA colonization surveillance program. It is not intended to diagnose MRSA infection nor to guide or monitor treatment for MRSA infections. Test performance is not FDA approved in patients less than 68 years old. Performed at [facility], [address] [place], [place] [address]   CSF culture w Gram Stain     Status: None (Preliminary result)   Collection Time: 08/04/23  4:36 PM   Specimen: PATH Cytology CSF; Cerebrospinal Fluid  Result Value Ref Range Status   Specimen Description CSF  Final   Special Requests NONE  Final   Gram Stain WBC SEEN NO ORGANISMS SEEN CYTOSPIN SMEAR   Final   Culture   Final    NO GROWTH 3 DAYS Performed at [facility], [address], [place], [place] [address]    Report Status PENDING  Incomplete  Culture, Fungus without Smear     Status: None (Preliminary result)   Collection Time: 08/04/23  4:36 PM   Specimen: PATH Cytology CSF; Cerebrospinal Fluid  Result Value Ref Range Status   Specimen Description CSF  Final   Special Requests NONE  Final   Culture   Final    NO FUNGUS ISOLATED AFTER 3 DAYS Performed at [facility], [address], [place], [place] [address]    Report Status  PENDING  Incomplete         Radiology Studies: VAS [place] UPPER EXTREMITY VENOUS DUPLEX Result Date: 08/07/2023 UPPER VENOUS STUDY  Patient Name:  Meryl Acosta  Date of Exam:   08/07/2023 Medical Rec #: [id number]         Accession #:    [id number] Date of Birth: [birth year]         Patient Gender: M Patient Age:   70 years Exam Location:  [facility] Procedure:      VAS US  UPPER EXTREMITY VENOUS DUPLEX Referring Phys: Phyllis Breeze --------------------------------------------------------------------------------  Indications: Swelling Other Indications: History of Crohn's disease, small bowel obstruction, ileostomy. Comparison Study: No priors. Performing Technologist: Franky Ivanoff Sturdivant-Jones RDMS, RVT  Examination Guidelines: A complete evaluation includes B-mode imaging, spectral Doppler, color Doppler, and power Doppler as needed of all accessible portions of each vessel. Bilateral testing is considered an integral part of a complete examination. Limited examinations for reoccurring indications may be performed as noted.  Right Findings: +----------+------------+---------+-----------+----------+-------+ RIGHT     CompressiblePhasicitySpontaneousPropertiesSummary +----------+------------+---------+-----------+----------+-------+ IJV           Full       Yes       Yes                      +----------+------------+---------+-----------+----------+-------+ Subclavian               Yes       Yes                      +----------+------------+---------+-----------+----------+-------+ Axillary      Full       Yes       Yes                      +----------+------------+---------+-----------+----------+-------+ Brachial      Full                                          +----------+------------+---------+-----------+----------+-------+ Radial        Full                                          +----------+------------+---------+-----------+----------+-------+ Ulnar          Full                                          +----------+------------+---------+-----------+----------+-------+ Cephalic      Full                                          +----------+------------+---------+-----------+----------+-------+ Basilic       Full                                          +----------+------------+---------+-----------+----------+-------+  Left Findings: +----------+------------+---------+-----------+----------+-------+ LEFT      CompressiblePhasicitySpontaneousPropertiesSummary +----------+------------+---------+-----------+----------+-------+ Subclavian               Yes       Yes                      +----------+------------+---------+-----------+----------+-------+  Summary:  Right: No evidence of deep vein thrombosis in the upper extremity. No evidence of superficial vein thrombosis in the upper extremity.  Left: No evidence of thrombosis in the subclavian.  *See table(s) above for measurements and observations.    Preliminary         Scheduled Meds:  Chlorhexidine Gluconate Cloth  6 each Topical Daily   heparin injection (subcutaneous)  5,000 Units Subcutaneous Q8H   mirtazapine  15 mg Oral QHS   mupirocin ointment   Topical Daily   mouth rinse  15 mL Mouth Rinse 4 times per day   thiamine  100 mg Oral Daily   Continuous Infusions:     LOS: 5 days    Time spent: 55 minutes    Vada Garibaldi, MD [facility]
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
## 2023-08-08 NOTE — Progress Notes (Signed)
 OT Cancellation Note  Patient Details Name: Shane Klein MRN: [id number] DOB: 10-28-1990   Cancelled Treatment:    Reason Eval/Treat Not Completed: Other (comment) Per RN, patient is confused and minimally alert. OT will follow back at later date to complete OT evaluation.   Mollie Anger E. Campbell Agramonte, OTR/L Acute Rehabilitation Services [phone]   Vincent Greek 08/08/2023, 2:48 PM

## 2023-08-09 DIAGNOSIS — G9340 Encephalopathy, unspecified: Secondary | ICD-10-CM | POA: Diagnosis not present

## 2023-08-09 LAB — GLUCOSE, CAPILLARY
Glucose-Capillary: 80 mg/dL (ref 70–99)
Glucose-Capillary: 97 mg/dL (ref 70–99)
Glucose-Capillary: 98 mg/dL (ref 70–99)
Glucose-Capillary: 79 mg/dL (ref 70–99)
Glucose-Capillary: 91 mg/dL (ref 70–99)

## 2023-08-09 MED ORDER — BACLOFEN 10 MG PO TABS
5.0000 mg | ORAL_TABLET | Freq: Three times a day (TID) | ORAL | Status: DC
  Administered 2023-08-09 – 2023-08-10 (×4): 5 mg via ORAL
  Filled 2023-08-09 (×4): qty 1

## 2023-08-09 MED ORDER — BUSPIRONE HCL 5 MG PO TABS
5.0000 mg | ORAL_TABLET | Freq: Three times a day (TID) | ORAL | Status: DC
  Administered 2023-08-09 – 2023-08-10 (×4): 5 mg via ORAL
  Filled 2023-08-09 (×4): qty 1

## 2023-08-09 MED ORDER — GABAPENTIN 100 MG PO CAPS
200.0000 mg | ORAL_CAPSULE | Freq: Three times a day (TID) | ORAL | Status: DC
  Administered 2023-08-09 – 2023-08-10 (×4): 200 mg via ORAL
  Filled 2023-08-09 (×4): qty 2

## 2023-08-09 MED ORDER — LORAZEPAM 2 MG/ML IJ SOLN
0.5000 mg | Freq: Once | INTRAMUSCULAR | Status: AC | PRN
  Administered 2023-08-09: 0.5 mg via INTRAVENOUS
  Filled 2023-08-09: qty 1

## 2023-08-09 NOTE — Progress Notes (Signed)
 PROGRESS NOTE    Shane Klein  ZOX:[id number] DOB: 01-Nov-1990 DOA: 08/03/2023 PCP: [name], NP    Brief Narrative:  33 year old with history of Crohn's disease with small bowel obstruction status post ileostomy presented [facility] emergency room from jail with altered mental status and hypotension.  Recent admission with similar presentation with altered mental status, elevated LFTs, AKI and rhabdomyolysis which was thought to be from polypharmacy.  He was discharged in normal condition.  On arrival, CT head and EEG were negative.  Patient was hypotensive and altered.  He was admitted to the ICU and treated with IV fluid and vasopressors.  Patient was found with creatinine of 7.7, anion gap 31, BUN 111.  Reportedly he was thrashing around and bumping around, hitting his head in the jail. In the emergency room, hypotensive, altered.  Required Levophed and aggressive fluid resuscitation.  Seen by nephrology.  Admitted to ICU.  Also seen by neurology. 4/12, transferred out of ICU.  Still altered. 4/13, waking up but is still not very interactive.  Declines medications. 4/14, more awake but is still declining food. 4/15, surprisingly interactive and asking to take shower.  Subjective:  Patient seen and examined.  Today he is interactive.  He tells me that he does not remember what brought him to the hospital.  He does not remember interactions with this provider until yesterday. Denies any complaints.  He is agreeable to drink plenty of water and eat food. Patient tells me that he used to take BuSpar 3 times a day for his mental health. He was asking whether he can take shower. Patient was given 0.5 mg of Ativan overnight for agitation.   Assessment & Plan:   Acute encephalopathy: Suspected secondary to polypharmacy and also with uremia.  Recent admission that was thought to be secondary to Flexeril and gabapentin.  This was discontinued. CT scan of the brain without any acute  findings. EEG without evidence of seizure. Lumbar puncture without evidence of infection.  After prolonged altered mentation, he is slightly improving.  Mental status is normalizing. Continue one-to-one monitoring for safety. Started back on Remeron 15 mg at night 4/14. Patient does have history of baclofen use, now renal functions has stabilized.  He has painful conditions and muscle pain.  Will put patient back on baclofen 5 mg 3 times daily today. Patient stated taking BuSpar before, will start patient back on BuSpar 5 mg 3 times daily.  Psychiatry to follow-up. Use Seroquel as needed for agitation but will avoid. Mobilize.  Encourage nutrition and fluid intake.  Hypovolemic shock, AKI, anion gap metabolic acidosis and rhabdomyolysis: Treated with aggressive fluid resuscitation.  Urine output is adequate. Creatinine 7.7-1.87-1.7-1.6.  CK levels improving and normalized.  Electrolytes are adequate.  Encourage oral intake as he is more interactive today.  Soft tissue injuries, nasal bone fracture: Supportive care.  Transaminitis and leukocytosis: Probably due to  shock liver.  LFTs appropriately improving.  Right arm swelling: Probably due to IV infiltration.  Negative for DVT.  Some clinical improvement today.  PT OT to mobilize, monitor oral intake to ultimate discharge next 24 to 48 hours.   DVT prophylaxis: heparin injection 5,000 Units Start: 08/04/23 2200 SCDs Start: 08/03/23 1049   Code Status: Full code Family Communication: None at the bedside.  Catering manager and Software engineer. Disposition Plan: Status is: Inpatient Remains inpatient appropriate because: Significant abnormal electrolytes, altered mental status.  Not eating.     Consultants:  Critical care Neurology Neurology Psychiatry  Procedures:  Lumbar puncture  Antimicrobials:  Completed     Objective: Vitals:   08/09/23 0340 08/09/23 0431 08/09/23 0816 08/09/23 0850  BP: (!) 122/91  (!)  116/101 (!) 126/95  Pulse: 98  (!) 115 (!) 117  Resp: (!) 21  13 14   Temp: 98.6 F (37 C)  99.9 F (37.7 C)   TempSrc: Oral  Oral   SpO2: 97%  99% 98%  Weight:  83.4 kg    Height:        Intake/Output Summary (Last 24 hours) at 08/09/2023 1029 Last data filed at 08/09/2023 0816 Gross per 24 hour  Intake 588 ml  Output 2825 ml  Net -2237 ml   Filed Weights   08/07/23 0500 08/08/23 0425 08/09/23 0431  Weight: 83.4 kg 83.4 kg 83.4 kg    Examination:  General exam: Looks comfortable.  Interactive.  Asking appropriate questions today. Respiratory system: Clear to auscultation. Respiratory effort normal.  No added sounds. Soft tissue injuries both upper eyelids.  Bruise over right groin, lower quadrant abdomen. Cardiovascular system: S1 & S2 heard, RRR.  Gastrointestinal system: Ostomy with loose stool present.  Multiple previous scars present. Central nervous system:  Alert and awake.  Flat affect.  Looks comfortable.  On room air.   Minimal swelling of the right arm.   Data Reviewed: I have personally reviewed following labs and imaging studies  CBC: Recent Labs  Lab 08/03/23 0750 08/03/23 2013 08/04/23 0430 08/05/23 0436 08/06/23 0837 08/07/23 0959  WBC 31.7*  --  14.3* 7.9 6.8 9.2  NEUTROABS 28.5*  --   --   --   --   --   HGB 14.4 11.9* 11.8* 10.5* 10.9* 12.8*  HCT 41.2 35.0* 33.0* 29.7* 31.8* 37.6*  MCV 80.2  --  81.3 81.4 82.0 82.3  PLT 478*  --  279 277 283 328   Basic Metabolic Panel: Recent Labs  Lab 08/04/23 0430 08/05/23 0436 08/06/23 0837 08/07/23 0959 08/08/23 0341  NA 140 142 137 136 135  K 4.0 3.3* 3.3* 4.1 4.0  CL 97* 104 99 98 96*  CO2 23 25 26 25 25   GLUCOSE 75 100* 99 101* 104*  BUN 90* 66* 38* 25* 23*  CREATININE 4.41* 2.69* 1.87* 1.71* 1.63*  CALCIUM 8.7* 9.0 9.4 10.0 10.0  MG  --   --  2.0 1.8  --   PHOS  --   --  2.6  --   --    GFR: Estimated Creatinine Clearance: 69.3 mL/min (A) (by C-G formula based on SCr of 1.63 mg/dL  (H)). Liver Function Tests: Recent Labs  Lab 08/03/23 0750 08/04/23 0430 08/05/23 0436 08/06/23 0837 08/07/23 0959  AST 401* 179* 109* 74* 76*  ALT 566* 284* 195* 151* 141*  ALKPHOS 156* 90 86 88 94  BILITOT 3.5* 1.9* 1.1 1.1 1.1  PROT 11.1* 7.0 6.8 7.9 8.9*  ALBUMIN 5.1* 3.2* 2.8* 3.2* 3.5   No results for input(s): "LIPASE", "AMYLASE" in the last 168 hours. Recent Labs  Lab 08/03/23 1313  AMMONIA 14   Coagulation Profile: No results for input(s): "INR", "PROTIME" in the last 168 hours. Cardiac Enzymes: Recent Labs  Lab 08/03/23 1021 08/04/23 0430 08/05/23 0436 08/06/23 0837 08/08/23 0341  CKTOTAL 3,647* 5,371* 2,341* 817* 364   BNP (last 3 results) No results for input(s): "PROBNP" in the last 8760 hours. HbA1C: No results for input(s): "HGBA1C" in the last 72 hours. CBG: Recent Labs  Lab 08/08/23 1646 08/08/23 1943 08/08/23  2329 08/09/23 0356 08/09/23 0807  GLUCAP 89 108* 92 80 97   Lipid Profile: No results for input(s): "CHOL", "HDL", "LDLCALC", "TRIG", "CHOLHDL", "LDLDIRECT" in the last 72 hours. Thyroid Function Tests: No results for input(s): "TSH", "T4TOTAL", "FREET4", "T3FREE", "THYROIDAB" in the last 72 hours. Anemia Panel: No results for input(s): "VITAMINB12", "FOLATE", "FERRITIN", "TIBC", "IRON", "RETICCTPCT" in the last 72 hours.  Sepsis Labs: Recent Labs  Lab 08/03/23 1313 08/04/23 0853 08/05/23 0925  LATICACIDVEN 3.9* 1.7 1.0    Recent Results (from the past 240 hours)  Culture, blood (Routine X 2) w Reflex to ID Panel     Status: None   Collection Time: 08/03/23  1:13 PM   Specimen: BLOOD LEFT ARM  Result Value Ref Range Status   Specimen Description BLOOD LEFT ARM  Final   Special Requests   Final    BOTTLES DRAWN AEROBIC AND ANAEROBIC Blood Culture adequate volume   Culture   Final    NO GROWTH 5 DAYS Performed at [facility], [address], [place], [place] [address]    Report Status 08/08/2023 FINAL  Final   Culture, blood (Routine X 2) w Reflex to ID Panel     Status: None   Collection Time: 08/03/23  1:13 PM   Specimen: BLOOD LEFT HAND  Result Value Ref Range Status   Specimen Description BLOOD LEFT HAND  Final   Special Requests   Final    BOTTLES DRAWN AEROBIC AND ANAEROBIC Blood Culture adequate volume   Culture   Final    NO GROWTH 5 DAYS Performed at [facility], [address], [place], [place] [address]    Report Status 08/08/2023 FINAL  Final  MRSA Next Gen by PCR, Nasal     Status: None   Collection Time: 08/03/23  7:26 PM   Specimen: Nasal Mucosa; Nasal Swab  Result Value Ref Range Status   MRSA by PCR Next Gen NOT DETECTED NOT DETECTED Final    Comment: (NOTE) The GeneXpert MRSA Assay (FDA approved for NASAL specimens only), is one component of a comprehensive MRSA colonization surveillance program. It is not intended to diagnose MRSA infection nor to guide or monitor treatment for MRSA infections. Test performance is not FDA approved in patients less than 76 years old. Performed at [facility], [address] [place], [place] [address]   CSF culture w Gram Stain     Status: None   Collection Time: 08/04/23  4:36 PM   Specimen: PATH Cytology CSF; Cerebrospinal Fluid  Result Value Ref Range Status   Specimen Description CSF  Final   Special Requests NONE  Final   Gram Stain WBC SEEN NO ORGANISMS SEEN CYTOSPIN SMEAR   Final   Culture   Final    NO GROWTH 3 DAYS Performed at [facility], [address] [place], [place] [address]    Report Status 08/08/2023 FINAL  Final  Culture, Fungus without Smear     Status: None (Preliminary result)   Collection Time: 08/04/23  4:36 PM   Specimen: PATH Cytology CSF; Cerebrospinal Fluid  Result Value Ref Range Status   Specimen Description CSF  Final   Special Requests NONE  Final   Culture   Final    NO GROWTH 4 DAYS Performed at [facility], [address] [place], [place] [address]     Report Status PENDING  Incomplete         Radiology Studies:  No results found.       Scheduled Meds:  baclofen  5 mg Oral TID   busPIRone  5 mg Oral TID   Chlorhexidine Gluconate Cloth  6 each Topical Daily   heparin injection (subcutaneous)  5,000 Units Subcutaneous Q8H   mirtazapine  15 mg Oral QHS   mupirocin ointment   Topical Daily   mouth rinse  15 mL Mouth Rinse 4 times per day   thiamine  100 mg Oral Daily   Continuous Infusions:     LOS: 6 days    Time spent: 55 minutes    Vada Garibaldi, MD [facility]
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
## 2023-08-09 NOTE — Progress Notes (Signed)
 Inpatient Rehab Admissions Coordinator:   Per therapy recommendations pt was screened for CIR candidacy by Loye Rumble, PT, DPT.  Note pt in custody of [employer].  We will not pursue CIR at this time.    Loye Rumble, PT, DPT Admissions Coordinator [phone] 08/09/23  2:16 PM

## 2023-08-09 NOTE — Plan of Care (Signed)
  Problem: Safety: Goal: Violent Restraint(s) 08/09/2023 0138 by Alejos Husband, RN Outcome: Progressing 08/08/2023 2049 by Alejos Husband, RN Outcome: Progressing   Problem: Education: Goal: Knowledge of General Education information will improve Description: Including pain rating scale, medication(s)/side effects and non-pharmacologic comfort measures 08/09/2023 0138 by Alejos Husband, RN Outcome: Progressing 08/08/2023 2049 by Alejos Husband, RN Outcome: Progressing   Problem: Health Behavior/Discharge Planning: Goal: Ability to manage health-related needs will improve 08/09/2023 0138 by Alejos Husband, RN Outcome: Progressing 08/08/2023 2049 by Alejos Husband, RN Outcome: Progressing   Problem: Clinical Measurements: Goal: Ability to maintain clinical measurements within normal limits will improve 08/09/2023 0138 by Alejos Husband, RN Outcome: Progressing 08/08/2023 2049 by Alejos Husband, RN Outcome: Progressing Goal: Will remain free from infection 08/09/2023 0138 by Alejos Husband, RN Outcome: Progressing 08/08/2023 2049 by Alejos Husband, RN Outcome: Progressing Goal: Diagnostic test results will improve 08/09/2023 0138 by Alejos Husband, RN Outcome: Progressing 08/08/2023 2049 by Alejos Husband, RN Outcome: Progressing Goal: Respiratory complications will improve 08/09/2023 0138 by Alejos Husband, RN Outcome: Progressing 08/08/2023 2049 by Alejos Husband, RN Outcome: Progressing Goal: Cardiovascular complication will be avoided 08/09/2023 0138 by Alejos Husband, RN Outcome: Progressing 08/08/2023 2049 by Alejos Husband, RN Outcome: Progressing   Problem: Activity: Goal: Risk for activity intolerance will decrease 08/09/2023 0138 by Alejos Husband, RN Outcome: Progressing 08/08/2023 2049 by Alejos Husband, RN Outcome: Progressing   Problem: Nutrition: Goal: Adequate nutrition will be  maintained 08/09/2023 0138 by Alejos Husband, RN Outcome: Progressing 08/08/2023 2049 by Alejos Husband, RN Outcome: Progressing   Problem: Coping: Goal: Level of anxiety will decrease 08/09/2023 0138 by Alejos Husband, RN Outcome: Progressing 08/08/2023 2049 by Alejos Husband, RN Outcome: Progressing   Problem: Elimination: Goal: Will not experience complications related to bowel motility 08/09/2023 0138 by Alejos Husband, RN Outcome: Progressing 08/08/2023 2049 by Alejos Husband, RN Outcome: Progressing Goal: Will not experience complications related to urinary retention 08/09/2023 0138 by Alejos Husband, RN Outcome: Progressing 08/08/2023 2049 by Alejos Husband, RN Outcome: Progressing   Problem: Pain Managment: Goal: General experience of comfort will improve and/or be controlled 08/09/2023 0138 by Alejos Husband, RN Outcome: Progressing 08/08/2023 2049 by Alejos Husband, RN Outcome: Progressing   Problem: Safety: Goal: Ability to remain free from injury will improve 08/09/2023 0138 by Alejos Husband, RN Outcome: Progressing 08/08/2023 2049 by Alejos Husband, RN Outcome: Progressing   Problem: Skin Integrity: Goal: Risk for impaired skin integrity will decrease 08/09/2023 0138 by Alejos Husband, RN Outcome: Progressing 08/08/2023 2049 by Alejos Husband, RN Outcome: Progressing
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
## 2023-08-09 NOTE — Plan of Care (Signed)
  Problem: Safety: Goal: Violent Restraint(s) 08/09/2023 2304 by Alejos Husband, RN Outcome: Progressing 08/09/2023 2004 by Alejos Husband, RN Outcome: Progressing   Problem: Education: Goal: Knowledge of General Education information will improve Description: Including pain rating scale, medication(s)/side effects and non-pharmacologic comfort measures 08/09/2023 2304 by Alejos Husband, RN Outcome: Progressing 08/09/2023 2004 by Alejos Husband, RN Outcome: Progressing   Problem: Health Behavior/Discharge Planning: Goal: Ability to manage health-related needs will improve 08/09/2023 2304 by Alejos Husband, RN Outcome: Progressing 08/09/2023 2004 by Alejos Husband, RN Outcome: Progressing   Problem: Clinical Measurements: Goal: Ability to maintain clinical measurements within normal limits will improve 08/09/2023 2304 by Alejos Husband, RN Outcome: Progressing 08/09/2023 2004 by Alejos Husband, RN Outcome: Progressing Goal: Will remain free from infection 08/09/2023 2304 by Alejos Husband, RN Outcome: Progressing 08/09/2023 2004 by Alejos Husband, RN Outcome: Progressing Goal: Diagnostic test results will improve 08/09/2023 2304 by Alejos Husband, RN Outcome: Progressing 08/09/2023 2004 by Alejos Husband, RN Outcome: Progressing Goal: Respiratory complications will improve 08/09/2023 2304 by Alejos Husband, RN Outcome: Progressing 08/09/2023 2004 by Alejos Husband, RN Outcome: Progressing Goal: Cardiovascular complication will be avoided 08/09/2023 2304 by Alejos Husband, RN Outcome: Progressing 08/09/2023 2004 by Alejos Husband, RN Outcome: Progressing   Problem: Activity: Goal: Risk for activity intolerance will decrease 08/09/2023 2304 by Alejos Husband, RN Outcome: Progressing 08/09/2023 2004 by Alejos Husband, RN Outcome: Progressing   Problem: Nutrition: Goal: Adequate nutrition will be  maintained 08/09/2023 2304 by Alejos Husband, RN Outcome: Progressing 08/09/2023 2004 by Alejos Husband, RN Outcome: Progressing   Problem: Coping: Goal: Level of anxiety will decrease 08/09/2023 2304 by Alejos Husband, RN Outcome: Progressing 08/09/2023 2004 by Alejos Husband, RN Outcome: Progressing   Problem: Elimination: Goal: Will not experience complications related to bowel motility 08/09/2023 2304 by Alejos Husband, RN Outcome: Progressing 08/09/2023 2004 by Alejos Husband, RN Outcome: Progressing Goal: Will not experience complications related to urinary retention 08/09/2023 2304 by Alejos Husband, RN Outcome: Progressing 08/09/2023 2004 by Alejos Husband, RN Outcome: Progressing   Problem: Pain Managment: Goal: General experience of comfort will improve and/or be controlled 08/09/2023 2304 by Alejos Husband, RN Outcome: Progressing 08/09/2023 2004 by Alejos Husband, RN Outcome: Progressing   Problem: Safety: Goal: Ability to remain free from injury will improve 08/09/2023 2304 by Alejos Husband, RN Outcome: Progressing 08/09/2023 2004 by Alejos Husband, RN Outcome: Progressing   Problem: Skin Integrity: Goal: Risk for impaired skin integrity will decrease 08/09/2023 2304 by Alejos Husband, RN Outcome: Progressing 08/09/2023 2004 by Alejos Husband, RN Outcome: Progressing
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
## 2023-08-10 ENCOUNTER — Inpatient Hospital Stay (HOSPITAL_COMMUNITY): Admission: EM | Admit: 2023-08-10 | Discharge: 2023-08-15 | Attending: Student | Admitting: Student

## 2023-08-10 ENCOUNTER — Encounter (HOSPITAL_COMMUNITY): Payer: Self-pay | Admitting: Family Medicine

## 2023-08-10 ENCOUNTER — Encounter: Admitting: Occupational Therapy

## 2023-08-10 ENCOUNTER — Ambulatory Visit: Admitting: Physical Therapy

## 2023-08-10 ENCOUNTER — Other Ambulatory Visit (HOSPITAL_COMMUNITY): Payer: Self-pay

## 2023-08-10 DIAGNOSIS — X58XXXS Exposure to other specified factors, sequela: Secondary | ICD-10-CM | POA: Diagnosis present

## 2023-08-10 DIAGNOSIS — G629 Polyneuropathy, unspecified: Secondary | ICD-10-CM | POA: Diagnosis present

## 2023-08-10 DIAGNOSIS — R7401 Elevation of levels of liver transaminase levels: Secondary | ICD-10-CM | POA: Diagnosis present

## 2023-08-10 DIAGNOSIS — Z91013 Allergy to seafood: Secondary | ICD-10-CM

## 2023-08-10 DIAGNOSIS — F32A Depression, unspecified: Secondary | ICD-10-CM | POA: Diagnosis present

## 2023-08-10 DIAGNOSIS — G8222 Paraplegia, incomplete: Secondary | ICD-10-CM | POA: Diagnosis present

## 2023-08-10 DIAGNOSIS — N179 Acute kidney failure, unspecified: Principal | ICD-10-CM | POA: Diagnosis present

## 2023-08-10 DIAGNOSIS — F419 Anxiety disorder, unspecified: Secondary | ICD-10-CM | POA: Diagnosis present

## 2023-08-10 DIAGNOSIS — Z8249 Family history of ischemic heart disease and other diseases of the circulatory system: Secondary | ICD-10-CM

## 2023-08-10 DIAGNOSIS — G9340 Encephalopathy, unspecified: Secondary | ICD-10-CM | POA: Diagnosis not present

## 2023-08-10 DIAGNOSIS — Z933 Colostomy status: Secondary | ICD-10-CM

## 2023-08-10 DIAGNOSIS — Z91011 Allergy to milk products: Secondary | ICD-10-CM

## 2023-08-10 DIAGNOSIS — E860 Dehydration: Secondary | ICD-10-CM | POA: Diagnosis present

## 2023-08-10 DIAGNOSIS — L24B3 Irritant contact dermatitis related to fecal or urinary stoma or fistula: Secondary | ICD-10-CM | POA: Diagnosis present

## 2023-08-10 DIAGNOSIS — E871 Hypo-osmolality and hyponatremia: Secondary | ICD-10-CM | POA: Diagnosis present

## 2023-08-10 DIAGNOSIS — Z91018 Allergy to other foods: Secondary | ICD-10-CM

## 2023-08-10 DIAGNOSIS — Z932 Ileostomy status: Secondary | ICD-10-CM

## 2023-08-10 DIAGNOSIS — K5000 Crohn's disease of small intestine without complications: Secondary | ICD-10-CM | POA: Diagnosis present

## 2023-08-10 DIAGNOSIS — Z87891 Personal history of nicotine dependence: Secondary | ICD-10-CM

## 2023-08-10 DIAGNOSIS — K5080 Crohn's disease of both small and large intestine without complications: Secondary | ICD-10-CM | POA: Diagnosis present

## 2023-08-10 DIAGNOSIS — R198 Other specified symptoms and signs involving the digestive system and abdomen: Secondary | ICD-10-CM

## 2023-08-10 DIAGNOSIS — T148XXS Other injury of unspecified body region, sequela: Secondary | ICD-10-CM

## 2023-08-10 DIAGNOSIS — Z79899 Other long term (current) drug therapy: Secondary | ICD-10-CM

## 2023-08-10 LAB — COMPREHENSIVE METABOLIC PANEL WITH GFR
ALT: 331 U/L — ABNORMAL HIGH (ref 0–44)
AST: 207 U/L — ABNORMAL HIGH (ref 15–41)
Albumin: 4.4 g/dL (ref 3.5–5.0)
Alkaline Phosphatase: 137 U/L — ABNORMAL HIGH (ref 38–126)
Anion gap: 17 — ABNORMAL HIGH (ref 5–15)
BUN: 66 mg/dL — ABNORMAL HIGH (ref 6–20)
CO2: 22 mmol/L (ref 22–32)
Calcium: 10.3 mg/dL (ref 8.9–10.3)
Chloride: 90 mmol/L — ABNORMAL LOW (ref 98–111)
Creatinine, Ser: 3.27 mg/dL — ABNORMAL HIGH (ref 0.61–1.24)
GFR, Estimated: 25 mL/min — ABNORMAL LOW (ref >60)
Glucose, Bld: 86 mg/dL (ref 70–99)
Potassium: 4.3 mmol/L (ref 3.5–5.1)
Sodium: 129 mmol/L — ABNORMAL LOW (ref 135–145)
Total Bilirubin: 1 mg/dL (ref 0.0–1.2)
Total Protein: 11 g/dL — ABNORMAL HIGH (ref 6.5–8.1)

## 2023-08-10 LAB — CBC WITH DIFFERENTIAL/PLATELET
Abs Immature Granulocytes: 0.25 10*3/uL — ABNORMAL HIGH (ref 0.00–0.07)
Basophils Absolute: 0.1 10*3/uL (ref 0.0–0.1)
Basophils Relative: 1 %
Eosinophils Absolute: 0.3 10*3/uL (ref 0.0–0.5)
Eosinophils Relative: 2 %
HCT: 41.9 % (ref 39.0–52.0)
Hemoglobin: 14.3 g/dL (ref 13.0–17.0)
Immature Granulocytes: 2 %
Lymphocytes Relative: 14 %
Lymphs Abs: 1.9 10*3/uL (ref 0.7–4.0)
MCH: 28.3 pg (ref 26.0–34.0)
MCHC: 34.1 g/dL (ref 30.0–36.0)
MCV: 82.8 fL (ref 80.0–100.0)
Monocytes Absolute: 1.4 10*3/uL — ABNORMAL HIGH (ref 0.1–1.0)
Monocytes Relative: 10 %
Neutro Abs: 9.9 10*3/uL — ABNORMAL HIGH (ref 1.7–7.7)
Neutrophils Relative %: 71 %
Platelets: 370 10*3/uL (ref 150–400)
RBC: 5.06 MIL/uL (ref 4.22–5.81)
RDW: 15.1 % (ref 11.5–15.5)
WBC: 13.9 10*3/uL — ABNORMAL HIGH (ref 4.0–10.5)
nRBC: 0 % (ref 0.0–0.2)

## 2023-08-10 LAB — CK: Total CK: 384 U/L (ref 49–397)

## 2023-08-10 MED ORDER — DIPHENOXYLATE-ATROPINE 2.5-0.025 MG PO TABS
1.0000 | ORAL_TABLET | Freq: Two times a day (BID) | ORAL | Status: DC
  Administered 2023-08-11 – 2023-08-15 (×10): 1 via ORAL
  Filled 2023-08-10 (×10): qty 1

## 2023-08-10 MED ORDER — ACETAMINOPHEN 325 MG PO TABS: 650.0000 mg | ORAL_TABLET | ORAL | Status: DC | PRN

## 2023-08-10 MED ORDER — SODIUM CHLORIDE 0.9 % IV BOLUS
1000.0000 mL | Freq: Once | INTRAVENOUS | Status: AC
Start: 2023-08-10 — End: 2023-08-10
  Administered 2023-08-10: 1000 mL via INTRAVENOUS

## 2023-08-10 MED ORDER — HYDROXYZINE HCL 25 MG PO TABS
25.0000 mg | ORAL_TABLET | Freq: Every evening | ORAL | Status: DC | PRN
  Administered 2023-08-11 – 2023-08-14 (×2): 25 mg via ORAL
  Filled 2023-08-10 (×2): qty 1

## 2023-08-10 MED ORDER — BACLOFEN 10 MG PO TABS
5.0000 mg | ORAL_TABLET | Freq: Three times a day (TID) | ORAL | Status: DC
  Administered 2023-08-11 – 2023-08-15 (×14): 5 mg via ORAL
  Filled 2023-08-10 (×14): qty 1

## 2023-08-10 MED ORDER — ACETAMINOPHEN 325 MG PO TABS: 650.0000 mg | ORAL_TABLET | Freq: Four times a day (QID) | ORAL | Status: DC | PRN

## 2023-08-10 MED ORDER — SODIUM CHLORIDE 0.9% FLUSH
3.0000 mL | Freq: Two times a day (BID) | INTRAVENOUS | Status: DC
  Administered 2023-08-11 – 2023-08-15 (×8): 3 mL via INTRAVENOUS

## 2023-08-10 MED ORDER — VITAMIN B-1 100 MG PO TABS: 100.0000 mg | ORAL_TABLET | Freq: Every day | ORAL | 0 refills | Status: DC

## 2023-08-10 MED ORDER — PROCHLORPERAZINE EDISYLATE 10 MG/2ML IJ SOLN: 5.0000 mg | Freq: Four times a day (QID) | INTRAMUSCULAR | Status: DC | PRN

## 2023-08-10 MED ORDER — ACETAMINOPHEN 650 MG RE SUPP: 650.0000 mg | Freq: Four times a day (QID) | RECTAL | Status: DC | PRN

## 2023-08-10 MED ORDER — MIRTAZAPINE 15 MG PO TABS
15.0000 mg | ORAL_TABLET | Freq: Every day | ORAL | Status: DC
  Administered 2023-08-11 – 2023-08-14 (×4): 15 mg via ORAL
  Filled 2023-08-10 (×4): qty 1

## 2023-08-10 MED ORDER — CALCIUM POLYCARBOPHIL 625 MG PO TABS
1250.0000 mg | ORAL_TABLET | Freq: Three times a day (TID) | ORAL | Status: DC
  Administered 2023-08-11 – 2023-08-15 (×13): 1250 mg via ORAL
  Filled 2023-08-10 (×15): qty 2

## 2023-08-10 MED ORDER — MIRTAZAPINE 15 MG PO TABS: 15.0000 mg | ORAL_TABLET | Freq: Every day | ORAL | 0 refills | Status: DC

## 2023-08-10 MED ORDER — BACLOFEN 5 MG PO TABS: 5.0000 mg | ORAL_TABLET | Freq: Three times a day (TID) | ORAL | 0 refills | Status: DC

## 2023-08-10 MED ORDER — GABAPENTIN 300 MG PO CAPS
300.0000 mg | ORAL_CAPSULE | Freq: Three times a day (TID) | ORAL | 0 refills | Status: DC
Start: 2023-08-10 — End: 2023-08-11

## 2023-08-10 MED ORDER — GABAPENTIN 300 MG PO CAPS
300.0000 mg | ORAL_CAPSULE | Freq: Two times a day (BID) | ORAL | Status: DC
  Administered 2023-08-11 – 2023-08-12 (×4): 300 mg via ORAL
  Filled 2023-08-10 (×4): qty 1

## 2023-08-10 MED ORDER — ZINC OXIDE 40 % EX OINT
TOPICAL_OINTMENT | Freq: Once | CUTANEOUS | Status: AC
  Filled 2023-08-10: qty 57

## 2023-08-10 MED ORDER — BUSPIRONE HCL 5 MG PO TABS
5.0000 mg | ORAL_TABLET | Freq: Three times a day (TID) | ORAL | Status: DC
  Administered 2023-08-11 – 2023-08-15 (×14): 5 mg via ORAL
  Filled 2023-08-10 (×14): qty 1

## 2023-08-10 MED ORDER — BUSPIRONE HCL 5 MG PO TABS
5.0000 mg | ORAL_TABLET | Freq: Three times a day (TID) | ORAL | 0 refills | Status: DC
  Filled 2023-08-10: qty 90, 30d supply, fill #0

## 2023-08-10 MED ORDER — SODIUM CHLORIDE 0.9 % IV SOLN: INTRAVENOUS | Status: AC

## 2023-08-10 MED ORDER — SODIUM CHLORIDE 0.9 % IV SOLN: Freq: Once | INTRAVENOUS | Status: DC

## 2023-08-10 MED ORDER — HEPARIN SODIUM (PORCINE) 5000 UNIT/ML IJ SOLN
5000.0000 [IU] | Freq: Three times a day (TID) | INTRAMUSCULAR | Status: DC
  Administered 2023-08-11 – 2023-08-15 (×12): 5000 [IU] via SUBCUTANEOUS
  Filled 2023-08-10 (×13): qty 1
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
## 2023-08-10 NOTE — ED Notes (Signed)
 Ileostomy bag has been brought from [facility] and been placed by patient with the help of this RN

## 2023-08-10 NOTE — Progress Notes (Signed)
 PROGRESS NOTE    Shane Klein  ZOX:[id number] DOB: [birth year] DOA: 08/03/2023 PCP: [name], NP    Brief Narrative:  33 year old with history of Crohn's disease with small bowel obstruction status post ileostomy presented to emergency room from jail with altered mental status and hypotension.  Recent admission with similar presentation with altered mental status, elevated LFTs, AKI and rhabdomyolysis which was thought to be from polypharmacy.  He was discharged in normal condition.  On arrival, CT head and EEG were negative.  Patient was hypotensive and altered.  He was admitted to the ICU and treated with IV fluid and vasopressors.  Patient was found with creatinine of 7.7, anion gap 31, BUN 111.  Reportedly he was thrashing around and bumping around, hitting his head in the jail. In the emergency room, hypotensive, altered.  Required Levophed and aggressive fluid resuscitation.  Seen by nephrology.  Admitted to ICU.  Also seen by neurology. 4/12, transferred out of ICU.  Still altered. 4/13, waking up but is still not very interactive.  Declines medications. 4/14, more awake but is still declining food. 4/15, surprisingly interactive and asking to take shower.  Subjective:  Patient seen and examined.  We he was very happy to have shower.  Complains of not having right size of the colostomy pouch.  He is alert awake and interactive.  Occasionally shows some malingering as per psychiatry. Deconditioned and trying to walk around with a walker. Discussed with him about taking adequate meal and water and resuming his long-term medications including gabapentin, baclofen and BuSpar.  He is agreeable. May need to improve mobility before discharging back to jail.   Assessment & Plan:   Acute encephalopathy: Suspected secondary to polypharmacy and also with uremia.  Recent admission that was thought to be secondary to baclofen and gabapentin.  There was suspicion of baclofen withdrawal  and catatonia.  CT scan of the brain without any acute findings. EEG without evidence of seizure. Lumbar puncture without evidence of infection.  After prolonged altered mentation, he is slightly improving.  Mental status is normalizing. Continue one-to-one monitoring for safety. Started back on Remeron 15 mg at night 4/14. Patient does have history of baclofen use, now renal functions has stabilized.  He has painful conditions and muscle pain.  Will put patient back on baclofen 5 mg 3 times daily today. Patient stated taking BuSpar before, will start patient back on BuSpar 5 mg 3 times daily.  Psychiatry to follow-up. Resume gabapentin 200 mg 3 times daily, reduced dose from original doses..  Patient does have history of painful condition. Use Seroquel as needed for agitation but will avoid. Mobilize.  Encourage nutrition and fluid intake.  Hypovolemic shock, AKI, anion gap metabolic acidosis and rhabdomyolysis: Treated with aggressive fluid resuscitation.  Urine output is adequate. Creatinine 7.7-1.87-1.7-1.6.  CK levels improving and normalized.  Electrolytes are adequate.  Encourage oral intake as he is more interactive today.  Soft tissue injuries, nasal bone fracture: Supportive care.  Transaminitis and leukocytosis: Probably due to  shock liver.  LFTs appropriately improving.  Right arm swelling: Probably due to IV infiltration.  Negative for DVT. Improved.   Some clinical improvement today.  PT OT to mobilize, monitor oral intake to ultimate discharge  when [facility] is comfortable taking him with his mobility. Police at the bedside.    DVT prophylaxis: heparin injection 5,000 Units Start: 08/04/23 2200 SCDs Start: 08/03/23 1049   Code Status: Full code Family Communication: None at the bedside.  Security  personnel and Software engineer. Disposition Plan: Status is: Inpatient Remains inpatient appropriate because: Impaired mobility.  Needs to improve mobility before discharging.      Consultants:  Critical care Neurology Neurology Psychiatry  Procedures:  Lumbar puncture  Antimicrobials:  Completed     Objective: Vitals:   08/09/23 2254 08/10/23 0308 08/10/23 0500 08/10/23 0820  BP:    111/84  Pulse:    (!) 102  Resp:  12  14  Temp:  98.2 F (36.8 C)  98.5 F (36.9 C)  TempSrc: Oral Oral  Oral  SpO2:    98%  Weight:   83.4 kg   Height:        Intake/Output Summary (Last 24 hours) at 08/10/2023 1052 Last data filed at 08/10/2023 0300 Gross per 24 hour  Intake 240 ml  Output 1900 ml  Net -1660 ml   Filed Weights   08/08/23 0425 08/09/23 0431 08/10/23 0500  Weight: 83.4 kg 83.4 kg 83.4 kg    Examination:  General exam: Looks comfortable.  Interactive.   Respiratory system: Clear to auscultation. Respiratory effort normal.  No added sounds. Soft tissue injuries both upper eyelids.  Bruise over right groin, lower quadrant abdomen. Cardiovascular system: S1 & S2 heard, RRR.  Gastrointestinal system: Ostomy with loose stool present.  Multiple previous scars present. Central nervous system:  Alert and awake.  Interactive. Minimal swelling of the right arm.   Data Reviewed: I have personally reviewed following labs and imaging studies  CBC: Recent Labs  Lab 08/03/23 2013 08/04/23 0430 08/05/23 0436 08/06/23 0837 08/07/23 0959  WBC  --  14.3* 7.9 6.8 9.2  HGB 11.9* 11.8* 10.5* 10.9* 12.8*  HCT 35.0* 33.0* 29.7* 31.8* 37.6*  MCV  --  81.3 81.4 82.0 82.3  PLT  --  279 277 283 328   Basic Metabolic Panel: Recent Labs  Lab 08/04/23 0430 08/05/23 0436 08/06/23 0837 08/07/23 0959 08/08/23 0341  NA 140 142 137 136 135  K 4.0 3.3* 3.3* 4.1 4.0  CL 97* 104 99 98 96*  CO2 23 25 26 25 25   GLUCOSE 75 100* 99 101* 104*  BUN 90* 66* 38* 25* 23*  CREATININE 4.41* 2.69* 1.87* 1.71* 1.63*  CALCIUM 8.7* 9.0 9.4 10.0 10.0  MG  --   --  2.0 1.8  --   PHOS  --   --  2.6  --   --    GFR: Estimated Creatinine Clearance: 69.3 mL/min (A)  (by C-G formula based on SCr of 1.63 mg/dL (H)). Liver Function Tests: Recent Labs  Lab 08/04/23 0430 08/05/23 0436 08/06/23 0837 08/07/23 0959  AST 179* 109* 74* 76*  ALT 284* 195* 151* 141*  ALKPHOS 90 86 88 94  BILITOT 1.9* 1.1 1.1 1.1  PROT 7.0 6.8 7.9 8.9*  ALBUMIN 3.2* 2.8* 3.2* 3.5   No results for input(s): "LIPASE", "AMYLASE" in the last 168 hours. Recent Labs  Lab 08/03/23 1313  AMMONIA 14   Coagulation Profile: No results for input(s): "INR", "PROTIME" in the last 168 hours. Cardiac Enzymes: Recent Labs  Lab 08/04/23 0430 08/05/23 0436 08/06/23 0837 08/08/23 0341  CKTOTAL 5,371* 2,341* 817* 364   BNP (last 3 results) No results for input(s): "PROBNP" in the last 8760 hours. HbA1C: No results for input(s): "HGBA1C" in the last 72 hours. CBG: Recent Labs  Lab 08/09/23 0356 08/09/23 0807 08/09/23 1146 08/09/23 1516 08/09/23 1916  GLUCAP 80 97 98 79 91   Lipid Profile: No results for input(s): "  CHOL", "HDL", "LDLCALC", "TRIG", "CHOLHDL", "LDLDIRECT" in the last 72 hours. Thyroid Function Tests: No results for input(s): "TSH", "T4TOTAL", "FREET4", "T3FREE", "THYROIDAB" in the last 72 hours. Anemia Panel: No results for input(s): "VITAMINB12", "FOLATE", "FERRITIN", "TIBC", "IRON", "RETICCTPCT" in the last 72 hours.  Sepsis Labs: Recent Labs  Lab 08/03/23 1313 08/04/23 0853 08/05/23 0925  LATICACIDVEN 3.9* 1.7 1.0    Recent Results (from the past 240 hours)  Culture, blood (Routine X 2) w Reflex to ID Panel     Status: None   Collection Time: 08/03/23  1:13 PM   Specimen: BLOOD LEFT ARM  Result Value Ref Range Status   Specimen Description BLOOD LEFT ARM  Final   Special Requests   Final    BOTTLES DRAWN AEROBIC AND ANAEROBIC Blood Culture adequate volume   Culture   Final    NO GROWTH 5 DAYS Performed at [facility], [address], [place], [place] [address]    Report Status 08/08/2023 FINAL  Final  Culture, blood (Routine X 2) w  Reflex to ID Panel     Status: None   Collection Time: 08/03/23  1:13 PM   Specimen: BLOOD LEFT HAND  Result Value Ref Range Status   Specimen Description BLOOD LEFT HAND  Final   Special Requests   Final    BOTTLES DRAWN AEROBIC AND ANAEROBIC Blood Culture adequate volume   Culture   Final    NO GROWTH 5 DAYS Performed at [facility], [address], [place], [place] [address]    Report Status 08/08/2023 FINAL  Final  MRSA Next Gen by PCR, Nasal     Status: None   Collection Time: 08/03/23  7:26 PM   Specimen: Nasal Mucosa; Nasal Swab  Result Value Ref Range Status   MRSA by PCR Next Gen NOT DETECTED NOT DETECTED Final    Comment: (NOTE) The GeneXpert MRSA Assay (FDA approved for NASAL specimens only), is one component of a comprehensive MRSA colonization surveillance program. It is not intended to diagnose MRSA infection nor to guide or monitor treatment for MRSA infections. Test performance is not FDA approved in patients less than 53 years old. Performed at [facility], [address] [place], [place] [address]   CSF culture w Gram Stain     Status: None   Collection Time: 08/04/23  4:36 PM   Specimen: PATH Cytology CSF; Cerebrospinal Fluid  Result Value Ref Range Status   Specimen Description CSF  Final   Special Requests NONE  Final   Gram Stain WBC SEEN NO ORGANISMS SEEN CYTOSPIN SMEAR   Final   Culture   Final    NO GROWTH 3 DAYS Performed at [facility], [address] [place], [place] [address]    Report Status 08/08/2023 FINAL  Final  Culture, Fungus without Smear     Status: None (Preliminary result)   Collection Time: 08/04/23  4:36 PM   Specimen: PATH Cytology CSF; Cerebrospinal Fluid  Result Value Ref Range Status   Specimen Description CSF  Final   Special Requests NONE  Final   Culture   Final    NO GROWTH 5 DAYS Performed at [facility], [address] [place], [place] [address]    Report Status PENDING  Incomplete          Radiology Studies: No results found.       Scheduled Meds:  baclofen  5 mg Oral TID  busPIRone  5 mg Oral TID   Chlorhexidine Gluconate Cloth  6 each Topical Daily   gabapentin  200 mg Oral TID   heparin injection (subcutaneous)  5,000 Units Subcutaneous Q8H   mirtazapine  15 mg Oral QHS   mupirocin ointment   Topical Daily   mouth rinse  15 mL Mouth Rinse 4 times per day   thiamine  100 mg Oral Daily   Continuous Infusions:     LOS: 7 days    Time spent: 55 minutes    Vada Garibaldi, MD [facility]

## 2023-08-10 NOTE — ED Notes (Signed)
 Illeostomy kit ordered

## 2023-08-10 NOTE — ED Provider Notes (Signed)
 [place] [facility] AT [facility] Provider Note   CSN: [id number] Arrival date & time: 08/10/23  1634     History  Chief Complaint  Patient presents with   Colostomy bag replacement    Patient stated that the colostomy bag was loose and removed when he returned to jail today from the hospital.     Shane Klein is a 33 y.o. male.  HPI Patient was discharged earlier today for acute kidney injury.  Patient has a colostomy.  Patient had been discharged back to [facility] jail.  At the jail he was addended to have hide to have low blood pressure of 73 systolic and sent back to the emergency department for reassessment.  Patient reports he does not feel much worse.  He denies significant weakness or focal pain.  He has not developed a fever that he knows of.    Home Medications Prior to Admission medications   Medication Sig Start Date End Date Taking? Authorizing Provider  acetaminophen (TYLENOL) 325 MG tablet Take 2 tablets (650 mg total) by mouth every 4 (four) hours as needed for fever or mild pain (pain score 1-3). 08/10/23   Vada Garibaldi, MD  ascorbic acid (VITAMIN C) 500 MG tablet Take 2 tablets (1,000 mg total) by mouth daily. 11/15/22   Lovorn, Megan, MD  baclofen 5 MG TABS Take 1 tablet (5 mg total) by mouth 3 (three) times daily. 08/10/23   Ghimire, Kuber, MD  busPIRone (BUSPAR) 5 MG tablet Take 1 tablet (5 mg total) by mouth 3 (three) times daily. 08/10/23   Ghimire, Kuber, MD  Calcium Polycarbophil (FIBER) 625 MG TABS Take 2 tablets (1,250 mg total) by mouth 3 (three) times daily. Patient taking differently: Take 625 mg by mouth in the morning, at noon, and at bedtime. 04/18/23   Lovorn, Megan, MD  cyanocobalamin 1000 MCG tablet Take 1 tablet (1,000 mcg total) by mouth daily. Get from PCP- I will not refill again Patient taking differently: Take 1,000 mcg by mouth daily. 11/15/22   Lovorn, Megan, MD  diphenoxylate-atropine (LOMOTIL) 2.5-0.025 MG  tablet Take 1 tablet by mouth in the morning and at bedtime.    [provider]  famotidine (PEPCID) 20 MG tablet Take 1 tablet (20 mg total) by mouth 2 (two) times daily. Needs to get from PCP in future- I will not refill again Patient taking differently: Take 20 mg by mouth in the morning. 04/08/23   Lovorn, Megan, MD  ferrous sulfate (FEROSUL) 325 (65 FE) MG tablet Take 1 tablet (325 mg total) by mouth 2 (two) times daily with a meal. Will give 1 more refill- get from PCP in future- I will not refill Patient taking differently: Take 325 mg by mouth in the morning and at bedtime. 05/02/23   Lovorn, Megan, MD  gabapentin (NEURONTIN) 300 MG capsule Take 1 capsule (300 mg total) by mouth 3 (three) times daily. 08/10/23   Ghimire, Kuber, MD  hydrOXYzine (ATARAX) 25 MG tablet Take 25 mg by mouth at bedtime as needed for anxiety. 11/22/22   [provider]  mirtazapine (REMERON) 15 MG tablet Take 1 tablet (15 mg total) by mouth at bedtime. 08/10/23 09/09/23  Ghimire, Kuber, MD  thiamine (VITAMIN B-1) 100 MG tablet Take 1 tablet (100 mg total) by mouth daily. 08/11/23   Vada Garibaldi, MD  vitamin D3 (CHOLECALCIFEROL) 25 MCG tablet Take 2 tablets (2,000 Units total) by mouth 2 (two) times daily. Get from PCP in future- will  not refill again Patient taking differently: Take 2,000 Units by mouth in the morning and at bedtime. 11/15/22   Lovorn, Aundra Millet, MD      Allergies    Other, Gluten meal, Lactose intolerance (gi), and Soybean oil    Review of Systems   Review of Systems  Physical Exam Updated Vital Signs BP 105/69 (BP Location: Left Arm)   Pulse (!) 102   Temp 97.6 F (36.4 C) (Oral)   Resp 16   Ht 5\' 11"  (1.803 m)   Wt 83.4 kg   SpO2 100%   BMI 25.64 kg/m  Physical Exam Constitutional:      Comments: Alert nontoxic no acute distress.  HENT:     Head:     Comments: Patient has older laceration healing above the right brow. Eyes:     Extraocular Movements: Extraocular  movements intact.     Pupils: Pupils are equal, round, and reactive to light.  Cardiovascular:     Rate and Rhythm: Regular rhythm. Tachycardia present.  Pulmonary:     Effort: Pulmonary effort is normal.     Breath sounds: Normal breath sounds.  Abdominal:     Comments: Patient has a colostomy right mid quadrant.  Fairly copious thin brownish-yellow stool output.  Multiple abdominal scars.  Nontender.  Musculoskeletal:        General: Normal range of motion.     Comments: Patient has some contractures of the toes.  Minor various abrasions on the extremities.  No significant peripheral edema.  Neurological:     General: No focal deficit present.     Mental Status: He is oriented to person, place, and time.     ED Results / Procedures / Treatments   Labs (all labs ordered are listed, but only abnormal results are displayed) Labs Reviewed  COMPREHENSIVE METABOLIC PANEL WITH GFR - Abnormal; Notable for the following components:      Result Value   Sodium 129 (*)    Chloride 90 (*)    BUN 66 (*)    Creatinine, Ser 3.27 (*)    Total Protein 11.0 (*)    AST 207 (*)    ALT 331 (*)    Alkaline Phosphatase 137 (*)    GFR, Estimated 25 (*)    Anion gap 17 (*)    All other components within normal limits  CBC WITH DIFFERENTIAL/PLATELET - Abnormal; Notable for the following components:   WBC 13.9 (*)    Neutro Abs 9.9 (*)    Monocytes Absolute 1.4 (*)    Abs Immature Granulocytes 0.25 (*)    All other components within normal limits  CK  URINALYSIS, ROUTINE W REFLEX MICROSCOPIC    EKG EKG Interpretation Date/Time:  Wednesday August 10 2023 16:44:06 EDT Ventricular Rate:  105 PR Interval:  139 QRS Duration:  92 QT Interval:  317 QTC Calculation: 419 R Axis:   88  Text Interpretation: Sinus tachycardia Right atrial enlargement early repolarization seen on previous tracing with improvement of T wave peaking seen on previous tracing Confirmed by Arby Barrette [phone]) on  08/10/2023 6:22:02 PM  Radiology No results found.  Procedures Procedures    Medications Ordered in ED Medications  0.9 %  sodium chloride infusion (has no administration in time range)  liver oil-zinc oxide (DESITIN) 40 % ointment ( Topical Given 08/10/23 2115)  sodium chloride 0.9 % bolus 1,000 mL (1,000 mLs Intravenous New Bag/Given 08/10/23 2138)    ED Course/ Medical Decision Making/ A&P  Medical Decision Making Amount and/or Complexity of Data Reviewed Labs: ordered.  Risk OTC drugs. Prescription drug management. Decision regarding hospitalization.  Patient presents as outlined.  Upon arrival blood pressures are low 100s systolic.  Patient has mild tachycardia.  He does not show signs of being syncopal or presyncopal at this time.  His mental status is clear.  He has no respiratory distress.  Will proceed with repeat lab work.  Patient was just discharged about 5 hours ago.  Labs show recurrent kidney injury with creatinine at 3.2 and BUN 66.  Sodium 129.  Anion gap 17.  White count 13.  H&H normal.  Will initiate normal saline volume resuscitation and plan for admission.  Given patient's colostomy, likely represents high output.  Consult 21:40: Tried hospitalist Dr. Brice Campi for admission        Final Clinical Impression(s) / ED Diagnoses Final diagnoses:  AKI (acute kidney injury) (HCC)  Hyponatremia    Rx / DC Orders ED Discharge Orders     None         Wynetta Heckle, MD 08/10/23 2146

## 2023-08-10 NOTE — Progress Notes (Signed)
Pt received breakfast 
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
## 2023-08-10 NOTE — ED Triage Notes (Signed)
 Patient BIB [facility], patient was discharged this morning from the hospital, brought in today due to his colostomy bag becoming loose and removed due to it not being the right size. EMS also called due to hypotension at [facility]. [facility], Ems reports BP of 158/90
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
## 2023-08-11 ENCOUNTER — Other Ambulatory Visit: Payer: Self-pay

## 2023-08-11 ENCOUNTER — Encounter (HOSPITAL_COMMUNITY): Payer: Self-pay | Admitting: Family Medicine

## 2023-08-11 DIAGNOSIS — N179 Acute kidney failure, unspecified: Secondary | ICD-10-CM | POA: Diagnosis not present

## 2023-08-11 DIAGNOSIS — R198 Other specified symptoms and signs involving the digestive system and abdomen: Secondary | ICD-10-CM | POA: Diagnosis not present

## 2023-08-11 DIAGNOSIS — Z932 Ileostomy status: Secondary | ICD-10-CM | POA: Diagnosis not present

## 2023-08-11 DIAGNOSIS — E871 Hypo-osmolality and hyponatremia: Secondary | ICD-10-CM | POA: Diagnosis not present

## 2023-08-11 LAB — CBC
HCT: 38.4 % — ABNORMAL LOW (ref 39.0–52.0)
Hemoglobin: 13.4 g/dL (ref 13.0–17.0)
MCH: 28.5 pg (ref 26.0–34.0)
MCHC: 34.9 g/dL (ref 30.0–36.0)
MCV: 81.5 fL (ref 80.0–100.0)
Platelets: 365 10*3/uL (ref 150–400)
RBC: 4.71 MIL/uL (ref 4.22–5.81)
RDW: 14.9 % (ref 11.5–15.5)
WBC: 12.9 10*3/uL — ABNORMAL HIGH (ref 4.0–10.5)
nRBC: 0 % (ref 0.0–0.2)

## 2023-08-11 LAB — COMPREHENSIVE METABOLIC PANEL WITH GFR
ALT: 295 U/L — ABNORMAL HIGH (ref 0–44)
AST: 160 U/L — ABNORMAL HIGH (ref 15–41)
Albumin: 4 g/dL (ref 3.5–5.0)
Alkaline Phosphatase: 124 U/L (ref 38–126)
Anion gap: 14 (ref 5–15)
BUN: 70 mg/dL — ABNORMAL HIGH (ref 6–20)
CO2: 22 mmol/L (ref 22–32)
Calcium: 10 mg/dL (ref 8.9–10.3)
Chloride: 92 mmol/L — ABNORMAL LOW (ref 98–111)
Creatinine, Ser: 2.75 mg/dL — ABNORMAL HIGH (ref 0.61–1.24)
GFR, Estimated: 30 mL/min — ABNORMAL LOW (ref >60)
Glucose, Bld: 98 mg/dL (ref 70–99)
Potassium: 3.4 mmol/L — ABNORMAL LOW (ref 3.5–5.1)
Sodium: 128 mmol/L — ABNORMAL LOW (ref 135–145)
Total Bilirubin: 0.9 mg/dL (ref 0.0–1.2)
Total Protein: 9.7 g/dL — ABNORMAL HIGH (ref 6.5–8.1)

## 2023-08-11 LAB — URINALYSIS, ROUTINE W REFLEX MICROSCOPIC
Bilirubin Urine: NEGATIVE
Glucose, UA: NEGATIVE mg/dL
Ketones, ur: NEGATIVE mg/dL
Nitrite: NEGATIVE
Protein, ur: 30 mg/dL — AB
Specific Gravity, Urine: 1.014 (ref 1.005–1.030)
pH: 5 (ref 5.0–8.0)

## 2023-08-11 LAB — SODIUM, URINE, RANDOM: Sodium, Ur: <10 mmol/L

## 2023-08-11 LAB — MAGNESIUM: Magnesium: 1.8 mg/dL (ref 1.7–2.4)

## 2023-08-11 LAB — CREATININE, URINE, RANDOM: Creatinine, Urine: 223 mg/dL

## 2023-08-11 LAB — PHOSPHORUS: Phosphorus: 6.3 mg/dL — ABNORMAL HIGH (ref 2.5–4.6)

## 2023-08-11 MED ORDER — SODIUM CHLORIDE 0.9 % IV BOLUS
1000.0000 mL | Freq: Once | INTRAVENOUS | Status: AC
  Administered 2023-08-11: 1000 mL via INTRAVENOUS

## 2023-08-11 MED ORDER — POTASSIUM CHLORIDE CRYS ER 20 MEQ PO TBCR
40.0000 meq | EXTENDED_RELEASE_TABLET | Freq: Once | ORAL | Status: AC
  Administered 2023-08-11: 40 meq via ORAL
  Filled 2023-08-11: qty 2

## 2023-08-11 MED ORDER — SODIUM CHLORIDE 0.9 % IV SOLN: INTRAVENOUS | Status: AC
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
## 2023-08-11 NOTE — ED Notes (Signed)
 Called [facility]N and NS picked up and informed we are bringing patient

## 2023-08-12 ENCOUNTER — Other Ambulatory Visit (HOSPITAL_COMMUNITY): Payer: Self-pay

## 2023-08-12 DIAGNOSIS — E871 Hypo-osmolality and hyponatremia: Secondary | ICD-10-CM | POA: Diagnosis not present

## 2023-08-12 DIAGNOSIS — N179 Acute kidney failure, unspecified: Secondary | ICD-10-CM | POA: Diagnosis not present

## 2023-08-12 DIAGNOSIS — R198 Other specified symptoms and signs involving the digestive system and abdomen: Secondary | ICD-10-CM | POA: Diagnosis not present

## 2023-08-12 DIAGNOSIS — Z932 Ileostomy status: Secondary | ICD-10-CM | POA: Diagnosis not present

## 2023-08-12 LAB — GLUCOSE, CAPILLARY: Glucose-Capillary: 94 mg/dL (ref 70–99)

## 2023-08-12 LAB — CBC
HCT: 34.8 % — ABNORMAL LOW (ref 39.0–52.0)
Hemoglobin: 12.1 g/dL — ABNORMAL LOW (ref 13.0–17.0)
MCH: 28.7 pg (ref 26.0–34.0)
MCHC: 34.8 g/dL (ref 30.0–36.0)
MCV: 82.5 fL (ref 80.0–100.0)
Platelets: 369 10*3/uL (ref 150–400)
RBC: 4.22 MIL/uL (ref 4.22–5.81)
RDW: 14.7 % (ref 11.5–15.5)
WBC: 7.6 10*3/uL (ref 4.0–10.5)
nRBC: 0 % (ref 0.0–0.2)

## 2023-08-12 LAB — COMPREHENSIVE METABOLIC PANEL WITH GFR
ALT: 250 U/L — ABNORMAL HIGH (ref 0–44)
AST: 117 U/L — ABNORMAL HIGH (ref 15–41)
Albumin: 3.7 g/dL (ref 3.5–5.0)
Alkaline Phosphatase: 113 U/L (ref 38–126)
Anion gap: 12 (ref 5–15)
BUN: 63 mg/dL — ABNORMAL HIGH (ref 6–20)
CO2: 23 mmol/L (ref 22–32)
Calcium: 9.4 mg/dL (ref 8.9–10.3)
Chloride: 96 mmol/L — ABNORMAL LOW (ref 98–111)
Creatinine, Ser: 2.07 mg/dL — ABNORMAL HIGH (ref 0.61–1.24)
GFR, Estimated: 43 mL/min — ABNORMAL LOW (ref >60)
Glucose, Bld: 93 mg/dL (ref 70–99)
Potassium: 3.9 mmol/L (ref 3.5–5.1)
Sodium: 131 mmol/L — ABNORMAL LOW (ref 135–145)
Total Bilirubin: 0.8 mg/dL (ref 0.0–1.2)
Total Protein: 8.7 g/dL — ABNORMAL HIGH (ref 6.5–8.1)

## 2023-08-12 MED ORDER — ZINC OXIDE 11.3 % EX CREA
TOPICAL_CREAM | Freq: Two times a day (BID) | CUTANEOUS | Status: DC
  Administered 2023-08-14: 1 via TOPICAL
  Filled 2023-08-12 (×2): qty 56

## 2023-08-12 MED ORDER — GABAPENTIN 400 MG PO CAPS
400.0000 mg | ORAL_CAPSULE | Freq: Three times a day (TID) | ORAL | Status: DC
  Administered 2023-08-12 (×2): 400 mg via ORAL
  Filled 2023-08-12 (×2): qty 1

## 2023-08-12 MED ORDER — LOPERAMIDE HCL 2 MG PO CAPS
2.0000 mg | ORAL_CAPSULE | Freq: Four times a day (QID) | ORAL | Status: DC
  Administered 2023-08-12 – 2023-08-13 (×5): 2 mg via ORAL
  Filled 2023-08-12 (×5): qty 1

## 2023-08-12 MED ORDER — SODIUM CHLORIDE 0.9 % IV SOLN: INTRAVENOUS | Status: DC
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
## 2023-08-13 DIAGNOSIS — R198 Other specified symptoms and signs involving the digestive system and abdomen: Secondary | ICD-10-CM | POA: Diagnosis not present

## 2023-08-13 DIAGNOSIS — N179 Acute kidney failure, unspecified: Secondary | ICD-10-CM | POA: Diagnosis not present

## 2023-08-13 DIAGNOSIS — Z932 Ileostomy status: Secondary | ICD-10-CM | POA: Diagnosis not present

## 2023-08-13 LAB — CBC
HCT: 32 % — ABNORMAL LOW (ref 39.0–52.0)
Hemoglobin: 10.8 g/dL — ABNORMAL LOW (ref 13.0–17.0)
MCH: 28.5 pg (ref 26.0–34.0)
MCHC: 33.8 g/dL (ref 30.0–36.0)
MCV: 84.4 fL (ref 80.0–100.0)
Platelets: 343 10*3/uL (ref 150–400)
RBC: 3.79 MIL/uL — ABNORMAL LOW (ref 4.22–5.81)
RDW: 14.9 % (ref 11.5–15.5)
WBC: 4.8 10*3/uL (ref 4.0–10.5)
nRBC: 0 % (ref 0.0–0.2)

## 2023-08-13 LAB — COMPREHENSIVE METABOLIC PANEL WITH GFR
ALT: 174 U/L — ABNORMAL HIGH (ref 0–44)
AST: 66 U/L — ABNORMAL HIGH (ref 15–41)
Albumin: 3.2 g/dL — ABNORMAL LOW (ref 3.5–5.0)
Alkaline Phosphatase: 88 U/L (ref 38–126)
Anion gap: 9 (ref 5–15)
BUN: 41 mg/dL — ABNORMAL HIGH (ref 6–20)
CO2: 22 mmol/L (ref 22–32)
Calcium: 9.3 mg/dL (ref 8.9–10.3)
Chloride: 103 mmol/L (ref 98–111)
Creatinine, Ser: 1.44 mg/dL — ABNORMAL HIGH (ref 0.61–1.24)
GFR, Estimated: >60 mL/min (ref >60)
Glucose, Bld: 84 mg/dL (ref 70–99)
Potassium: 4.3 mmol/L (ref 3.5–5.1)
Sodium: 134 mmol/L — ABNORMAL LOW (ref 135–145)
Total Bilirubin: 0.6 mg/dL (ref 0.0–1.2)
Total Protein: 7.9 g/dL (ref 6.5–8.1)

## 2023-08-13 LAB — GLUCOSE, CAPILLARY: Glucose-Capillary: 70 mg/dL (ref 70–99)

## 2023-08-13 MED ORDER — GABAPENTIN 300 MG PO CAPS
600.0000 mg | ORAL_CAPSULE | Freq: Three times a day (TID) | ORAL | Status: DC
Start: 2023-08-13 — End: 2023-08-15
  Administered 2023-08-13 – 2023-08-15 (×7): 600 mg via ORAL
  Filled 2023-08-13 (×7): qty 2

## 2023-08-13 MED ORDER — SODIUM CHLORIDE 0.9 % IV SOLN
INTRAVENOUS | Status: DC
Start: 2023-08-13 — End: 2023-08-14

## 2023-08-13 MED ORDER — LOPERAMIDE HCL 2 MG PO CAPS
4.0000 mg | ORAL_CAPSULE | Freq: Four times a day (QID) | ORAL | Status: DC
  Administered 2023-08-13 – 2023-08-15 (×8): 4 mg via ORAL
  Filled 2023-08-13 (×8): qty 2
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
## 2023-08-14 DIAGNOSIS — N179 Acute kidney failure, unspecified: Secondary | ICD-10-CM | POA: Diagnosis not present

## 2023-08-14 LAB — CBC
HCT: 34.7 % — ABNORMAL LOW (ref 39.0–52.0)
Hemoglobin: 11.3 g/dL — ABNORMAL LOW (ref 13.0–17.0)
MCH: 27.8 pg (ref 26.0–34.0)
MCHC: 32.6 g/dL (ref 30.0–36.0)
MCV: 85.5 fL (ref 80.0–100.0)
Platelets: 326 10*3/uL (ref 150–400)
RBC: 4.06 MIL/uL — ABNORMAL LOW (ref 4.22–5.81)
RDW: 14.8 % (ref 11.5–15.5)
WBC: 5.4 10*3/uL (ref 4.0–10.5)
nRBC: 0 % (ref 0.0–0.2)

## 2023-08-14 LAB — COMPREHENSIVE METABOLIC PANEL WITH GFR
ALT: 132 U/L — ABNORMAL HIGH (ref 0–44)
AST: 49 U/L — ABNORMAL HIGH (ref 15–41)
Albumin: 3.4 g/dL — ABNORMAL LOW (ref 3.5–5.0)
Alkaline Phosphatase: 89 U/L (ref 38–126)
Anion gap: 8 (ref 5–15)
BUN: 28 mg/dL — ABNORMAL HIGH (ref 6–20)
CO2: 20 mmol/L — ABNORMAL LOW (ref 22–32)
Calcium: 9.4 mg/dL (ref 8.9–10.3)
Chloride: 108 mmol/L (ref 98–111)
Creatinine, Ser: 1.22 mg/dL (ref 0.61–1.24)
GFR, Estimated: >60 mL/min (ref >60)
Glucose, Bld: 113 mg/dL — ABNORMAL HIGH (ref 70–99)
Potassium: 3.6 mmol/L (ref 3.5–5.1)
Sodium: 136 mmol/L (ref 135–145)
Total Bilirubin: 0.5 mg/dL (ref 0.0–1.2)
Total Protein: 8 g/dL (ref 6.5–8.1)

## 2023-08-14 MED ORDER — PEDIALYTE PO SOLN
1000.0000 mL | Freq: Every day | ORAL | Status: DC
Start: 2023-08-14 — End: 2023-08-15

## 2023-08-14 NOTE — Plan of Care (Signed)
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
## 2023-08-15 DIAGNOSIS — N179 Acute kidney failure, unspecified: Secondary | ICD-10-CM | POA: Diagnosis not present

## 2023-08-15 LAB — CBC
HCT: 29.1 % — ABNORMAL LOW (ref 39.0–52.0)
Hemoglobin: 9.7 g/dL — ABNORMAL LOW (ref 13.0–17.0)
MCH: 28.4 pg (ref 26.0–34.0)
MCHC: 33.3 g/dL (ref 30.0–36.0)
MCV: 85.1 fL (ref 80.0–100.0)
Platelets: 329 10*3/uL (ref 150–400)
RBC: 3.42 MIL/uL — ABNORMAL LOW (ref 4.22–5.81)
RDW: 15 % (ref 11.5–15.5)
WBC: 5.1 10*3/uL (ref 4.0–10.5)
nRBC: 0 % (ref 0.0–0.2)

## 2023-08-15 LAB — BASIC METABOLIC PANEL WITH GFR
Anion gap: 6 (ref 5–15)
BUN: 24 mg/dL — ABNORMAL HIGH (ref 6–20)
CO2: 20 mmol/L — ABNORMAL LOW (ref 22–32)
Calcium: 9.2 mg/dL (ref 8.9–10.3)
Chloride: 109 mmol/L (ref 98–111)
Creatinine, Ser: 1.36 mg/dL — ABNORMAL HIGH (ref 0.61–1.24)
GFR, Estimated: >60 mL/min
Glucose, Bld: 84 mg/dL (ref 70–99)
Potassium: 4.3 mmol/L (ref 3.5–5.1)
Sodium: 135 mmol/L (ref 135–145)

## 2023-08-15 LAB — MAGNESIUM: Magnesium: 1.3 mg/dL — ABNORMAL LOW (ref 1.7–2.4)

## 2023-08-15 MED ORDER — PEDIALYTE PO SOLN: 1000.0000 mL | Freq: Every day | ORAL | Status: DC
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
## 2023-08-15 NOTE — Plan of Care (Signed)
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
## 2023-08-17 ENCOUNTER — Encounter: Admitting: Occupational Therapy

## 2023-08-17 ENCOUNTER — Ambulatory Visit: Admitting: Physical Therapy

## 2023-08-23 LAB — OLIGOCLONAL BANDS, CSF + SERM

## 2023-08-24 ENCOUNTER — Ambulatory Visit: Admitting: Physical Therapy

## 2023-08-24 ENCOUNTER — Encounter: Admitting: Occupational Therapy

## 2023-08-25 LAB — CULTURE, FUNGUS WITHOUT SMEAR

## 2023-09-02 ENCOUNTER — Other Ambulatory Visit: Payer: Self-pay

## 2023-09-02 ENCOUNTER — Encounter (HOSPITAL_COMMUNITY): Payer: Self-pay

## 2023-09-02 ENCOUNTER — Inpatient Hospital Stay (HOSPITAL_COMMUNITY)
Admission: EM | Admit: 2023-09-02 | Discharge: 2023-09-05 | DRG: 683 | Attending: Internal Medicine | Admitting: Internal Medicine

## 2023-09-02 DIAGNOSIS — R198 Other specified symptoms and signs involving the digestive system and abdomen: Secondary | ICD-10-CM | POA: Diagnosis not present

## 2023-09-02 DIAGNOSIS — Z87891 Personal history of nicotine dependence: Secondary | ICD-10-CM

## 2023-09-02 DIAGNOSIS — Z79899 Other long term (current) drug therapy: Secondary | ICD-10-CM

## 2023-09-02 DIAGNOSIS — F32A Depression, unspecified: Secondary | ICD-10-CM | POA: Diagnosis present

## 2023-09-02 DIAGNOSIS — N179 Acute kidney failure, unspecified: Principal | ICD-10-CM

## 2023-09-02 DIAGNOSIS — G8222 Paraplegia, incomplete: Secondary | ICD-10-CM | POA: Diagnosis present

## 2023-09-02 DIAGNOSIS — F419 Anxiety disorder, unspecified: Secondary | ICD-10-CM | POA: Diagnosis present

## 2023-09-02 DIAGNOSIS — Z91013 Allergy to seafood: Secondary | ICD-10-CM

## 2023-09-02 DIAGNOSIS — E860 Dehydration: Principal | ICD-10-CM | POA: Diagnosis present

## 2023-09-02 DIAGNOSIS — R7401 Elevation of levels of liver transaminase levels: Secondary | ICD-10-CM | POA: Diagnosis present

## 2023-09-02 DIAGNOSIS — Z91018 Allergy to other foods: Secondary | ICD-10-CM

## 2023-09-02 DIAGNOSIS — R631 Polydipsia: Secondary | ICD-10-CM | POA: Diagnosis present

## 2023-09-02 DIAGNOSIS — Z932 Ileostomy status: Secondary | ICD-10-CM | POA: Diagnosis not present

## 2023-09-02 DIAGNOSIS — Z8249 Family history of ischemic heart disease and other diseases of the circulatory system: Secondary | ICD-10-CM

## 2023-09-02 DIAGNOSIS — K5000 Crohn's disease of small intestine without complications: Secondary | ICD-10-CM | POA: Diagnosis present

## 2023-09-02 LAB — CBC WITH DIFFERENTIAL/PLATELET
Abs Immature Granulocytes: 0.01 10*3/uL (ref 0.00–0.07)
Basophils Absolute: 0 10*3/uL (ref 0.0–0.1)
Basophils Relative: 1 %
Eosinophils Absolute: 0.2 10*3/uL (ref 0.0–0.5)
Eosinophils Relative: 3 %
HCT: 34.4 % — ABNORMAL LOW (ref 39.0–52.0)
Hemoglobin: 11.3 g/dL — ABNORMAL LOW (ref 13.0–17.0)
Immature Granulocytes: 0 %
Lymphocytes Relative: 31 %
Lymphs Abs: 1.8 10*3/uL (ref 0.7–4.0)
MCH: 28.3 pg (ref 26.0–34.0)
MCHC: 32.8 g/dL (ref 30.0–36.0)
MCV: 86 fL (ref 80.0–100.0)
Monocytes Absolute: 0.6 10*3/uL (ref 0.1–1.0)
Monocytes Relative: 11 %
Neutro Abs: 3.2 10*3/uL (ref 1.7–7.7)
Neutrophils Relative %: 54 %
Platelets: 262 10*3/uL (ref 150–400)
RBC: 4 MIL/uL — ABNORMAL LOW (ref 4.22–5.81)
RDW: 14.7 % (ref 11.5–15.5)
WBC: 5.9 10*3/uL (ref 4.0–10.5)
nRBC: 0 % (ref 0.0–0.2)

## 2023-09-02 LAB — COMPREHENSIVE METABOLIC PANEL WITH GFR
ALT: 83 U/L — ABNORMAL HIGH (ref 0–44)
AST: 55 U/L — ABNORMAL HIGH (ref 15–41)
Albumin: 4.6 g/dL (ref 3.5–5.0)
Alkaline Phosphatase: 161 U/L — ABNORMAL HIGH (ref 38–126)
Anion gap: 12 (ref 5–15)
BUN: 48 mg/dL — ABNORMAL HIGH (ref 6–20)
CO2: 30 mmol/L (ref 22–32)
Calcium: 10.1 mg/dL (ref 8.9–10.3)
Chloride: 93 mmol/L — ABNORMAL LOW (ref 98–111)
Creatinine, Ser: 2.97 mg/dL — ABNORMAL HIGH (ref 0.61–1.24)
GFR, Estimated: 28 mL/min — ABNORMAL LOW (ref >60)
Glucose, Bld: 92 mg/dL (ref 70–99)
Potassium: 4 mmol/L (ref 3.5–5.1)
Sodium: 135 mmol/L (ref 135–145)
Total Bilirubin: 1 mg/dL (ref 0.0–1.2)
Total Protein: 9 g/dL — ABNORMAL HIGH (ref 6.5–8.1)

## 2023-09-02 LAB — LIPASE, BLOOD: Lipase: 78 U/L — ABNORMAL HIGH (ref 11–51)

## 2023-09-02 MED ORDER — ACETAMINOPHEN 325 MG PO TABS: 650.0000 mg | ORAL_TABLET | Freq: Four times a day (QID) | ORAL | Status: DC | PRN

## 2023-09-02 MED ORDER — VITAMIN D 25 MCG (1000 UNIT) PO TABS
2000.0000 [IU] | ORAL_TABLET | Freq: Two times a day (BID) | ORAL | Status: DC
  Administered 2023-09-02 – 2023-09-05 (×6): 2000 [IU] via ORAL
  Filled 2023-09-02 (×7): qty 2

## 2023-09-02 MED ORDER — ONDANSETRON HCL 4 MG PO TABS: 4.0000 mg | ORAL_TABLET | Freq: Four times a day (QID) | ORAL | Status: DC | PRN

## 2023-09-02 MED ORDER — ACETAMINOPHEN 650 MG RE SUPP: 650.0000 mg | Freq: Four times a day (QID) | RECTAL | Status: DC | PRN

## 2023-09-02 MED ORDER — ENOXAPARIN SODIUM 40 MG/0.4ML IJ SOSY
40.0000 mg | PREFILLED_SYRINGE | Freq: Every day | INTRAMUSCULAR | Status: DC
  Administered 2023-09-02 – 2023-09-04 (×3): 40 mg via SUBCUTANEOUS
  Filled 2023-09-02 (×3): qty 0.4

## 2023-09-02 MED ORDER — BACLOFEN 10 MG PO TABS
10.0000 mg | ORAL_TABLET | Freq: Three times a day (TID) | ORAL | Status: DC
  Administered 2023-09-02 – 2023-09-05 (×8): 10 mg via ORAL
  Filled 2023-09-02 (×8): qty 1

## 2023-09-02 MED ORDER — FAMOTIDINE 20 MG PO TABS
20.0000 mg | ORAL_TABLET | Freq: Every morning | ORAL | Status: DC
  Administered 2023-09-03 – 2023-09-05 (×3): 20 mg via ORAL
  Filled 2023-09-02 (×3): qty 1

## 2023-09-02 MED ORDER — GABAPENTIN 400 MG PO CAPS
400.0000 mg | ORAL_CAPSULE | Freq: Three times a day (TID) | ORAL | Status: DC
  Administered 2023-09-02 – 2023-09-05 (×8): 400 mg via ORAL
  Filled 2023-09-02 (×8): qty 1

## 2023-09-02 MED ORDER — FERROUS SULFATE 325 (65 FE) MG PO TABS
325.0000 mg | ORAL_TABLET | Freq: Two times a day (BID) | ORAL | Status: DC
  Administered 2023-09-03 – 2023-09-05 (×4): 325 mg via ORAL
  Filled 2023-09-02 (×4): qty 1

## 2023-09-02 MED ORDER — SODIUM CHLORIDE 0.9 % IV BOLUS
1000.0000 mL | Freq: Once | INTRAVENOUS | Status: AC
  Administered 2023-09-02: 1000 mL via INTRAVENOUS

## 2023-09-02 MED ORDER — VITAMIN C 500 MG PO TABS
1000.0000 mg | ORAL_TABLET | Freq: Every day | ORAL | Status: DC
  Administered 2023-09-03 – 2023-09-05 (×3): 1000 mg via ORAL
  Filled 2023-09-02 (×3): qty 2

## 2023-09-02 MED ORDER — ONDANSETRON HCL 4 MG/2ML IJ SOLN: 4.0000 mg | Freq: Four times a day (QID) | INTRAMUSCULAR | Status: DC | PRN

## 2023-09-02 MED ORDER — SENNOSIDES-DOCUSATE SODIUM 8.6-50 MG PO TABS: 1.0000 | ORAL_TABLET | Freq: Every evening | ORAL | Status: DC | PRN

## 2023-09-02 MED ORDER — DIPHENOXYLATE-ATROPINE 2.5-0.025 MG PO TABS
1.0000 | ORAL_TABLET | Freq: Three times a day (TID) | ORAL | Status: DC
  Administered 2023-09-03 – 2023-09-05 (×7): 1 via ORAL
  Filled 2023-09-02 (×7): qty 1

## 2023-09-02 MED ORDER — DIPHENOXYLATE-ATROPINE 2.5-0.025 MG PO TABS: 1.0000 | ORAL_TABLET | Freq: Two times a day (BID) | ORAL | Status: DC

## 2023-09-02 MED ORDER — HYDROXYZINE HCL 25 MG PO TABS
50.0000 mg | ORAL_TABLET | Freq: Two times a day (BID) | ORAL | Status: DC
  Administered 2023-09-02 – 2023-09-05 (×6): 50 mg via ORAL
  Filled 2023-09-02 (×6): qty 2

## 2023-09-02 MED ORDER — PEDIALYTE PO SOLN: 1000.0000 mL | Freq: Two times a day (BID) | ORAL | Status: DC

## 2023-09-02 MED ORDER — VITAMIN B-12 1000 MCG PO TABS
1000.0000 ug | ORAL_TABLET | Freq: Every day | ORAL | Status: DC
  Administered 2023-09-03 – 2023-09-05 (×3): 1000 ug via ORAL
  Filled 2023-09-02 (×3): qty 1

## 2023-09-02 MED ORDER — SERTRALINE HCL 50 MG PO TABS
25.0000 mg | ORAL_TABLET | Freq: Every day | ORAL | Status: DC
  Administered 2023-09-03 – 2023-09-05 (×3): 25 mg via ORAL
  Filled 2023-09-02 (×3): qty 1

## 2023-09-02 MED ORDER — SODIUM CHLORIDE 0.9 % IV SOLN: INTRAVENOUS | Status: DC

## 2023-09-02 MED ORDER — MAGNESIUM OXIDE -MG SUPPLEMENT 400 (240 MG) MG PO TABS
800.0000 mg | ORAL_TABLET | Freq: Two times a day (BID) | ORAL | Status: DC
  Administered 2023-09-02 – 2023-09-05 (×6): 800 mg via ORAL
  Filled 2023-09-02 (×6): qty 2

## 2023-09-02 MED ORDER — CALCIUM POLYCARBOPHIL 625 MG PO TABS
1250.0000 mg | ORAL_TABLET | Freq: Three times a day (TID) | ORAL | Status: DC
  Administered 2023-09-02 – 2023-09-05 (×7): 1250 mg via ORAL
  Filled 2023-09-02 (×9): qty 2
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
## 2023-09-03 DIAGNOSIS — Z8249 Family history of ischemic heart disease and other diseases of the circulatory system: Secondary | ICD-10-CM | POA: Diagnosis not present

## 2023-09-03 DIAGNOSIS — Z932 Ileostomy status: Secondary | ICD-10-CM | POA: Diagnosis not present

## 2023-09-03 DIAGNOSIS — K5000 Crohn's disease of small intestine without complications: Secondary | ICD-10-CM | POA: Diagnosis present

## 2023-09-03 DIAGNOSIS — R631 Polydipsia: Secondary | ICD-10-CM | POA: Diagnosis present

## 2023-09-03 DIAGNOSIS — Z87891 Personal history of nicotine dependence: Secondary | ICD-10-CM | POA: Diagnosis not present

## 2023-09-03 DIAGNOSIS — Z79899 Other long term (current) drug therapy: Secondary | ICD-10-CM | POA: Diagnosis not present

## 2023-09-03 DIAGNOSIS — Z91013 Allergy to seafood: Secondary | ICD-10-CM | POA: Diagnosis not present

## 2023-09-03 DIAGNOSIS — R7401 Elevation of levels of liver transaminase levels: Secondary | ICD-10-CM | POA: Diagnosis present

## 2023-09-03 DIAGNOSIS — N179 Acute kidney failure, unspecified: Secondary | ICD-10-CM | POA: Diagnosis present

## 2023-09-03 DIAGNOSIS — Z91018 Allergy to other foods: Secondary | ICD-10-CM | POA: Diagnosis not present

## 2023-09-03 DIAGNOSIS — F32A Depression, unspecified: Secondary | ICD-10-CM | POA: Diagnosis present

## 2023-09-03 DIAGNOSIS — E860 Dehydration: Secondary | ICD-10-CM | POA: Diagnosis present

## 2023-09-03 DIAGNOSIS — G8222 Paraplegia, incomplete: Secondary | ICD-10-CM | POA: Diagnosis present

## 2023-09-03 DIAGNOSIS — F419 Anxiety disorder, unspecified: Secondary | ICD-10-CM | POA: Diagnosis present

## 2023-09-03 LAB — CBC
HCT: 27.3 % — ABNORMAL LOW (ref 39.0–52.0)
Hemoglobin: 9 g/dL — ABNORMAL LOW (ref 13.0–17.0)
MCH: 28.2 pg (ref 26.0–34.0)
MCHC: 33 g/dL (ref 30.0–36.0)
MCV: 85.6 fL (ref 80.0–100.0)
Platelets: 201 10*3/uL (ref 150–400)
RBC: 3.19 MIL/uL — ABNORMAL LOW (ref 4.22–5.81)
RDW: 14.8 % (ref 11.5–15.5)
WBC: 4 10*3/uL (ref 4.0–10.5)
nRBC: 0 % (ref 0.0–0.2)

## 2023-09-03 LAB — COMPREHENSIVE METABOLIC PANEL WITH GFR
ALT: 59 U/L — ABNORMAL HIGH (ref 0–44)
AST: 40 U/L (ref 15–41)
Albumin: 3.3 g/dL — ABNORMAL LOW (ref 3.5–5.0)
Alkaline Phosphatase: 123 U/L (ref 38–126)
Anion gap: 7 (ref 5–15)
BUN: 38 mg/dL — ABNORMAL HIGH (ref 6–20)
CO2: 28 mmol/L (ref 22–32)
Calcium: 9 mg/dL (ref 8.9–10.3)
Chloride: 101 mmol/L (ref 98–111)
Creatinine, Ser: 2.39 mg/dL — ABNORMAL HIGH (ref 0.61–1.24)
GFR, Estimated: 36 mL/min — ABNORMAL LOW (ref >60)
Glucose, Bld: 84 mg/dL (ref 70–99)
Potassium: 3.7 mmol/L (ref 3.5–5.1)
Sodium: 136 mmol/L (ref 135–145)
Total Bilirubin: 0.7 mg/dL (ref 0.0–1.2)
Total Protein: 6.8 g/dL (ref 6.5–8.1)

## 2023-09-03 LAB — MAGNESIUM: Magnesium: 1.9 mg/dL (ref 1.7–2.4)

## 2023-09-03 LAB — MRSA NEXT GEN BY PCR, NASAL: MRSA by PCR Next Gen: NOT DETECTED

## 2023-09-03 LAB — PHOSPHORUS: Phosphorus: 3.5 mg/dL (ref 2.5–4.6)

## 2023-09-03 MED ORDER — IPRATROPIUM-ALBUTEROL 0.5-2.5 (3) MG/3ML IN SOLN: 3.0000 mL | RESPIRATORY_TRACT | Status: DC | PRN

## 2023-09-03 MED ORDER — ORAL CARE MOUTH RINSE: 15.0000 mL | OROMUCOSAL | Status: DC | PRN

## 2023-09-03 MED ORDER — GUAIFENESIN 100 MG/5ML PO LIQD: 5.0000 mL | ORAL | Status: DC | PRN

## 2023-09-03 MED ORDER — SODIUM CHLORIDE 0.9 % IV SOLN: INTRAVENOUS | Status: DC

## 2023-09-03 MED ORDER — TRAZODONE HCL 50 MG PO TABS
50.0000 mg | ORAL_TABLET | Freq: Every evening | ORAL | Status: DC | PRN
  Filled 2023-09-03: qty 1

## 2023-09-03 MED ORDER — METOPROLOL TARTRATE 5 MG/5ML IV SOLN: 5.0000 mg | INTRAVENOUS | Status: DC | PRN

## 2023-09-03 MED ORDER — HYDRALAZINE HCL 20 MG/ML IJ SOLN: 10.0000 mg | INTRAMUSCULAR | Status: DC | PRN

## 2023-09-03 MED ORDER — GLUCAGON HCL RDNA (DIAGNOSTIC) 1 MG IJ SOLR: 1.0000 mg | INTRAMUSCULAR | Status: DC | PRN

## 2023-09-03 NOTE — Plan of Care (Signed)
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
## 2023-09-03 NOTE — Plan of Care (Signed)
  Problem: Education: Goal: Knowledge of General Education information will improve Description: Including pain rating scale, medication(s)/side effects and non-pharmacologic comfort measures Outcome: Progressing   Problem: Health Behavior/Discharge Planning: Goal: Ability to manage health-related needs will improve Outcome: Progressing   Problem: Clinical Measurements: Goal: Ability to maintain clinical measurements within normal limits will improve Outcome: Progressing Goal: Will remain free from infection Outcome: Progressing   Problem: Nutrition: Goal: Adequate nutrition will be maintained Outcome: Progressing   Problem: Elimination: Goal: Will not experience complications related to urinary retention Outcome: Progressing   Problem: Pain Managment: Goal: General experience of comfort will improve and/or be controlled Outcome: Progressing   Problem: Safety: Goal: Ability to remain free from injury will improve Outcome: Progressing   Problem: Skin Integrity: Goal: Risk for impaired skin integrity will decrease Outcome: Progressing   Problem: Elimination: Goal: Will not experience complications related to bowel motility Outcome: Not Progressing

## 2023-09-04 DIAGNOSIS — N179 Acute kidney failure, unspecified: Secondary | ICD-10-CM | POA: Diagnosis not present

## 2023-09-04 LAB — MAGNESIUM: Magnesium: 1.7 mg/dL (ref 1.7–2.4)

## 2023-09-04 LAB — BASIC METABOLIC PANEL WITH GFR
Anion gap: 5 (ref 5–15)
BUN: 26 mg/dL — ABNORMAL HIGH (ref 6–20)
CO2: 29 mmol/L (ref 22–32)
Calcium: 9.3 mg/dL (ref 8.9–10.3)
Chloride: 104 mmol/L (ref 98–111)
Creatinine, Ser: 1.71 mg/dL — ABNORMAL HIGH (ref 0.61–1.24)
GFR, Estimated: 54 mL/min — ABNORMAL LOW (ref >60)
Glucose, Bld: 85 mg/dL (ref 70–99)
Potassium: 4.1 mmol/L (ref 3.5–5.1)
Sodium: 138 mmol/L (ref 135–145)

## 2023-09-04 LAB — CBC
HCT: 29.2 % — ABNORMAL LOW (ref 39.0–52.0)
Hemoglobin: 9.5 g/dL — ABNORMAL LOW (ref 13.0–17.0)
MCH: 28.4 pg (ref 26.0–34.0)
MCHC: 32.5 g/dL (ref 30.0–36.0)
MCV: 87.4 fL (ref 80.0–100.0)
Platelets: 205 10*3/uL (ref 150–400)
RBC: 3.34 MIL/uL — ABNORMAL LOW (ref 4.22–5.81)
RDW: 15.3 % (ref 11.5–15.5)
WBC: 4.1 10*3/uL (ref 4.0–10.5)
nRBC: 0 % (ref 0.0–0.2)

## 2023-09-04 LAB — PHOSPHORUS: Phosphorus: 2.9 mg/dL (ref 2.5–4.6)
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
## 2023-09-04 NOTE — Plan of Care (Signed)

## 2023-09-04 NOTE — Progress Notes (Signed)
   09/04/23 0942  TOC Brief Assessment  Insurance and Status Reviewed  Patient has primary care physician Yes  Home environment has been reviewed [facility]  Prior level of function: Independent  Prior/Current Home Services No current home services  Social Drivers of Health Review SDOH reviewed no interventions necessary  Readmission risk has been reviewed Yes  Transition of care needs no transition of care needs at this time    [place], MSW, LCSW 09/04/2023 9:42 AM

## 2023-09-05 DIAGNOSIS — N179 Acute kidney failure, unspecified: Secondary | ICD-10-CM | POA: Diagnosis not present

## 2023-09-05 LAB — BASIC METABOLIC PANEL WITH GFR
Anion gap: 6 (ref 5–15)
BUN: 23 mg/dL — ABNORMAL HIGH (ref 6–20)
CO2: 26 mmol/L (ref 22–32)
Calcium: 8.9 mg/dL (ref 8.9–10.3)
Chloride: 105 mmol/L (ref 98–111)
Creatinine, Ser: 1.62 mg/dL — ABNORMAL HIGH (ref 0.61–1.24)
GFR, Estimated: 57 mL/min — ABNORMAL LOW (ref >60)
Glucose, Bld: 83 mg/dL (ref 70–99)
Potassium: 3.7 mmol/L (ref 3.5–5.1)
Sodium: 137 mmol/L (ref 135–145)

## 2023-09-05 LAB — CBC
HCT: 27.1 % — ABNORMAL LOW (ref 39.0–52.0)
Hemoglobin: 8.8 g/dL — ABNORMAL LOW (ref 13.0–17.0)
MCH: 28.9 pg (ref 26.0–34.0)
MCHC: 32.5 g/dL (ref 30.0–36.0)
MCV: 88.9 fL (ref 80.0–100.0)
Platelets: 176 10*3/uL (ref 150–400)
RBC: 3.05 MIL/uL — ABNORMAL LOW (ref 4.22–5.81)
RDW: 15.2 % (ref 11.5–15.5)
WBC: 3.9 10*3/uL — ABNORMAL LOW (ref 4.0–10.5)
nRBC: 0 % (ref 0.0–0.2)

## 2023-09-05 LAB — MAGNESIUM: Magnesium: 1.6 mg/dL — ABNORMAL LOW (ref 1.7–2.4)

## 2023-09-05 MED ORDER — MAGNESIUM SULFATE 4 GM/100ML IV SOLN
4.0000 g | Freq: Once | INTRAVENOUS | Status: AC
  Administered 2023-09-05: 4 g via INTRAVENOUS
  Filled 2023-09-05: qty 100

## 2023-09-05 MED ORDER — SODIUM CHLORIDE 0.9 % IV BOLUS
1000.0000 mL | Freq: Once | INTRAVENOUS | Status: AC
  Administered 2023-09-05: 1000 mL via INTRAVENOUS

## 2023-09-05 NOTE — Discharge Instructions (Signed)
 Recommend follow-up labs on 09/08/2023.  Thereafter he should get basic blood work including BMP, magnesium  at least twice a week possibly on Monday and Thursdays.  This can help further determine IV fluid infusion, normal saline twice a week to keep him well-hydrated and avoid recurrent admission and acute kidney injuries. Further fluid treatment can be adjusted by the medical team at the correctional facility Should follow-up outpatient general surgery to see if any further adjustments can be made in the future to help with his high output ostomy
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
## 2023-09-06 ENCOUNTER — Telehealth (INDEPENDENT_AMBULATORY_CARE_PROVIDER_SITE_OTHER): Payer: Self-pay

## 2023-09-06 NOTE — Transitions of Care (Post Inpatient/ED Visit) (Unsigned)
   09/06/2023  Name: KEYONTA LIVOLSI MRN: [id number] DOB: [birth year]  Today's TOC FU Call Status: Today's TOC FU Call Status:: Unsuccessful Call (1st Attempt) Unsuccessful Call (1st Attempt) Date: 09/06/23  Attempted to reach the patient regarding the most recent Inpatient/ED visit.  Follow Up Plan: Additional outreach attempts will be made to reach the patient to complete the Transitions of Care (Post Inpatient/ED visit) call.   Signature Darrall Ellison, LPN [facility] Nurse Health Advisor Direct Dial [phone]

## 2023-09-07 NOTE — Transitions of Care (Post Inpatient/ED Visit) (Signed)
   09/07/2023  Name: DELOYD BRUNKE MRN: [id number] DOB: 1990-06-27  Today's TOC FU Call Status: Today's TOC FU Call Status:: Unsuccessful Call (1st Attempt) Unsuccessful Call (1st Attempt) Date: 09/06/23  Attempted to reach the patient regarding the most recent Inpatient/ED visit.  Follow Up Plan: No further outreach attempts will be made at this time. We have been unable to contact the patient. Patient in custody Signature Darrall Ellison, LPN [facility] Nurse Health Advisor Direct Dial [phone]

## 2023-09-12 ENCOUNTER — Observation Stay (HOSPITAL_COMMUNITY): Admission: EM | Admit: 2023-09-12 | Discharge: 2023-09-13 | Attending: Internal Medicine | Admitting: Internal Medicine

## 2023-09-12 ENCOUNTER — Emergency Department (HOSPITAL_COMMUNITY)

## 2023-09-12 ENCOUNTER — Encounter (HOSPITAL_COMMUNITY): Payer: Self-pay | Admitting: Emergency Medicine

## 2023-09-12 DIAGNOSIS — D72819 Decreased white blood cell count, unspecified: Secondary | ICD-10-CM | POA: Insufficient documentation

## 2023-09-12 DIAGNOSIS — Z87828 Personal history of other (healed) physical injury and trauma: Secondary | ICD-10-CM

## 2023-09-12 DIAGNOSIS — N179 Acute kidney failure, unspecified: Secondary | ICD-10-CM | POA: Diagnosis not present

## 2023-09-12 DIAGNOSIS — K219 Gastro-esophageal reflux disease without esophagitis: Secondary | ICD-10-CM | POA: Diagnosis not present

## 2023-09-12 DIAGNOSIS — R7401 Elevation of levels of liver transaminase levels: Secondary | ICD-10-CM | POA: Diagnosis present

## 2023-09-12 DIAGNOSIS — S24103A Unspecified injury at T7-T10 level of thoracic spinal cord, initial encounter: Secondary | ICD-10-CM | POA: Diagnosis present

## 2023-09-12 DIAGNOSIS — G9341 Metabolic encephalopathy: Secondary | ICD-10-CM | POA: Insufficient documentation

## 2023-09-12 DIAGNOSIS — W3400XA Accidental discharge from unspecified firearms or gun, initial encounter: Secondary | ICD-10-CM

## 2023-09-12 DIAGNOSIS — Z79899 Other long term (current) drug therapy: Secondary | ICD-10-CM | POA: Insufficient documentation

## 2023-09-12 DIAGNOSIS — K529 Noninfective gastroenteritis and colitis, unspecified: Secondary | ICD-10-CM | POA: Insufficient documentation

## 2023-09-12 DIAGNOSIS — K8010 Calculus of gallbladder with chronic cholecystitis without obstruction: Secondary | ICD-10-CM | POA: Diagnosis present

## 2023-09-12 DIAGNOSIS — K9419 Other complications of enterostomy: Secondary | ICD-10-CM | POA: Diagnosis not present

## 2023-09-12 DIAGNOSIS — K5010 Crohn's disease of large intestine without complications: Secondary | ICD-10-CM | POA: Diagnosis present

## 2023-09-12 DIAGNOSIS — R42 Dizziness and giddiness: Secondary | ICD-10-CM | POA: Diagnosis present

## 2023-09-12 DIAGNOSIS — Z87891 Personal history of nicotine dependence: Secondary | ICD-10-CM | POA: Diagnosis not present

## 2023-09-12 DIAGNOSIS — R252 Cramp and spasm: Secondary | ICD-10-CM | POA: Diagnosis not present

## 2023-09-12 DIAGNOSIS — R680 Hypothermia, not associated with low environmental temperature: Secondary | ICD-10-CM | POA: Insufficient documentation

## 2023-09-12 DIAGNOSIS — K50819 Crohn's disease of both small and large intestine with unspecified complications: Secondary | ICD-10-CM

## 2023-09-12 DIAGNOSIS — F418 Other specified anxiety disorders: Secondary | ICD-10-CM | POA: Diagnosis not present

## 2023-09-12 DIAGNOSIS — G9340 Encephalopathy, unspecified: Secondary | ICD-10-CM | POA: Diagnosis present

## 2023-09-12 DIAGNOSIS — R198 Other specified symptoms and signs involving the digestive system and abdomen: Secondary | ICD-10-CM

## 2023-09-12 DIAGNOSIS — Z720 Tobacco use: Secondary | ICD-10-CM | POA: Diagnosis present

## 2023-09-12 DIAGNOSIS — E876 Hypokalemia: Secondary | ICD-10-CM | POA: Insufficient documentation

## 2023-09-12 DIAGNOSIS — K592 Neurogenic bowel, not elsewhere classified: Secondary | ICD-10-CM | POA: Diagnosis present

## 2023-09-12 DIAGNOSIS — N319 Neuromuscular dysfunction of bladder, unspecified: Secondary | ICD-10-CM | POA: Diagnosis not present

## 2023-09-12 DIAGNOSIS — F4310 Post-traumatic stress disorder, unspecified: Secondary | ICD-10-CM | POA: Diagnosis present

## 2023-09-12 DIAGNOSIS — K8012 Calculus of gallbladder with acute and chronic cholecystitis without obstruction: Secondary | ICD-10-CM | POA: Insufficient documentation

## 2023-09-12 DIAGNOSIS — T68XXXA Hypothermia, initial encounter: Secondary | ICD-10-CM | POA: Diagnosis present

## 2023-09-12 LAB — COMPREHENSIVE METABOLIC PANEL WITH GFR
ALT: 62 U/L — ABNORMAL HIGH (ref 0–44)
AST: 48 U/L — ABNORMAL HIGH (ref 15–41)
Albumin: 4.2 g/dL (ref 3.5–5.0)
Alkaline Phosphatase: 149 U/L — ABNORMAL HIGH (ref 38–126)
Anion gap: 10 (ref 5–15)
BUN: 19 mg/dL (ref 6–20)
CO2: 29 mmol/L (ref 22–32)
Calcium: 9.5 mg/dL (ref 8.9–10.3)
Chloride: 96 mmol/L — ABNORMAL LOW (ref 98–111)
Creatinine, Ser: 2.14 mg/dL — ABNORMAL HIGH (ref 0.61–1.24)
GFR, Estimated: 41 mL/min — ABNORMAL LOW (ref >60)
Glucose, Bld: 85 mg/dL (ref 70–99)
Potassium: 3.3 mmol/L — ABNORMAL LOW (ref 3.5–5.1)
Sodium: 135 mmol/L (ref 135–145)
Total Bilirubin: 1.4 mg/dL — ABNORMAL HIGH (ref 0.0–1.2)
Total Protein: 8.1 g/dL (ref 6.5–8.1)

## 2023-09-12 LAB — CBC WITH DIFFERENTIAL/PLATELET
Abs Immature Granulocytes: 0.01 10*3/uL (ref 0.00–0.07)
Basophils Absolute: 0 10*3/uL (ref 0.0–0.1)
Basophils Relative: 1 %
Eosinophils Absolute: 0.2 10*3/uL (ref 0.0–0.5)
Eosinophils Relative: 2 %
HCT: 28.3 % — ABNORMAL LOW (ref 39.0–52.0)
Hemoglobin: 9.4 g/dL — ABNORMAL LOW (ref 13.0–17.0)
Immature Granulocytes: 0 %
Lymphocytes Relative: 19 %
Lymphs Abs: 1.2 10*3/uL (ref 0.7–4.0)
MCH: 28.7 pg (ref 26.0–34.0)
MCHC: 33.2 g/dL (ref 30.0–36.0)
MCV: 86.3 fL (ref 80.0–100.0)
Monocytes Absolute: 1 10*3/uL (ref 0.1–1.0)
Monocytes Relative: 16 %
Neutro Abs: 3.9 10*3/uL (ref 1.7–7.7)
Neutrophils Relative %: 62 %
Platelets: 265 10*3/uL (ref 150–400)
RBC: 3.28 MIL/uL — ABNORMAL LOW (ref 4.22–5.81)
RDW: 15.1 % (ref 11.5–15.5)
WBC: 6.3 10*3/uL (ref 4.0–10.5)
nRBC: 0 % (ref 0.0–0.2)

## 2023-09-12 LAB — URINALYSIS, ROUTINE W REFLEX MICROSCOPIC
Bilirubin Urine: NEGATIVE
Glucose, UA: NEGATIVE mg/dL
Hgb urine dipstick: NEGATIVE
Ketones, ur: NEGATIVE mg/dL
Leukocytes,Ua: NEGATIVE
Nitrite: NEGATIVE
Protein, ur: NEGATIVE mg/dL
Specific Gravity, Urine: 1.004 — ABNORMAL LOW (ref 1.005–1.030)
pH: 6 (ref 5.0–8.0)

## 2023-09-12 LAB — RAPID URINE DRUG SCREEN, HOSP PERFORMED
Amphetamines: NOT DETECTED
Barbiturates: NOT DETECTED
Benzodiazepines: NOT DETECTED
Cocaine: NOT DETECTED
Opiates: NOT DETECTED
Tetrahydrocannabinol: NOT DETECTED

## 2023-09-12 LAB — ETHANOL: Alcohol, Ethyl (B): <15 mg/dL (ref <15)

## 2023-09-12 LAB — AMMONIA: Ammonia: 15 umol/L (ref 9–35)

## 2023-09-12 LAB — MRSA NEXT GEN BY PCR, NASAL: MRSA by PCR Next Gen: NOT DETECTED

## 2023-09-12 LAB — TSH: TSH: 1.806 u[IU]/mL (ref 0.350–4.500)

## 2023-09-12 LAB — ACETAMINOPHEN LEVEL: Acetaminophen (Tylenol), Serum: <10 ug/mL — ABNORMAL LOW (ref 10–30)

## 2023-09-12 LAB — MAGNESIUM: Magnesium: 1.8 mg/dL (ref 1.7–2.4)

## 2023-09-12 LAB — SALICYLATE LEVEL: Salicylate Lvl: <7 mg/dL — ABNORMAL LOW (ref 7.0–30.0)

## 2023-09-12 LAB — PHOSPHORUS: Phosphorus: 3.9 mg/dL (ref 2.5–4.6)

## 2023-09-12 MED ORDER — ACETAMINOPHEN 325 MG PO TABS: 650.0000 mg | ORAL_TABLET | Freq: Four times a day (QID) | ORAL | Status: DC | PRN

## 2023-09-12 MED ORDER — CHLORHEXIDINE GLUCONATE CLOTH 2 % EX PADS
6.0000 | MEDICATED_PAD | Freq: Every day | CUTANEOUS | Status: DC
  Administered 2023-09-12: 6 via TOPICAL

## 2023-09-12 MED ORDER — MAGNESIUM OXIDE -MG SUPPLEMENT 400 (240 MG) MG PO TABS: 800.0000 mg | ORAL_TABLET | Freq: Two times a day (BID) | ORAL | Status: DC

## 2023-09-12 MED ORDER — POTASSIUM CHLORIDE CRYS ER 20 MEQ PO TBCR
40.0000 meq | EXTENDED_RELEASE_TABLET | Freq: Two times a day (BID) | ORAL | Status: AC
  Administered 2023-09-12 (×2): 40 meq via ORAL
  Filled 2023-09-12 (×2): qty 2

## 2023-09-12 MED ORDER — DIPHENOXYLATE-ATROPINE 2.5-0.025 MG PO TABS
1.0000 | ORAL_TABLET | Freq: Two times a day (BID) | ORAL | Status: DC
  Administered 2023-09-12 – 2023-09-13 (×2): 1 via ORAL
  Filled 2023-09-12 (×2): qty 1

## 2023-09-12 MED ORDER — SODIUM CHLORIDE 0.9 % IV BOLUS
1000.0000 mL | Freq: Once | INTRAVENOUS | Status: AC
  Administered 2023-09-12: 1000 mL via INTRAVENOUS

## 2023-09-12 MED ORDER — ORAL CARE MOUTH RINSE: 15.0000 mL | OROMUCOSAL | Status: DC | PRN

## 2023-09-12 MED ORDER — VITAMIN B-12 1000 MCG PO TABS
1000.0000 ug | ORAL_TABLET | Freq: Every day | ORAL | Status: DC
  Administered 2023-09-13: 1000 ug via ORAL
  Filled 2023-09-12: qty 1

## 2023-09-12 MED ORDER — FAMOTIDINE 20 MG PO TABS
20.0000 mg | ORAL_TABLET | Freq: Every morning | ORAL | Status: DC
  Administered 2023-09-13: 20 mg via ORAL
  Filled 2023-09-12: qty 1

## 2023-09-12 MED ORDER — FERROUS SULFATE 325 (65 FE) MG PO TABS
325.0000 mg | ORAL_TABLET | Freq: Two times a day (BID) | ORAL | Status: DC
Start: 2023-09-12 — End: 2023-09-13
  Administered 2023-09-12 – 2023-09-13 (×2): 325 mg via ORAL
  Filled 2023-09-12 (×2): qty 1

## 2023-09-12 MED ORDER — ACETAMINOPHEN 650 MG RE SUPP: 650.0000 mg | Freq: Four times a day (QID) | RECTAL | Status: DC | PRN

## 2023-09-12 MED ORDER — SODIUM CHLORIDE 0.9 % IV SOLN: INTRAVENOUS | Status: DC

## 2023-09-12 MED ORDER — LACTATED RINGERS IV BOLUS
1000.0000 mL | Freq: Once | INTRAVENOUS | Status: AC
  Administered 2023-09-12: 1000 mL via INTRAVENOUS

## 2023-09-12 MED ORDER — CALCIUM POLYCARBOPHIL 625 MG PO TABS
1250.0000 mg | ORAL_TABLET | Freq: Three times a day (TID) | ORAL | Status: DC
  Administered 2023-09-12 – 2023-09-13 (×3): 1250 mg via ORAL
  Filled 2023-09-12 (×3): qty 2

## 2023-09-12 MED ORDER — BACLOFEN 10 MG PO TABS
30.0000 mg | ORAL_TABLET | Freq: Three times a day (TID) | ORAL | Status: DC
  Administered 2023-09-12 (×2): 30 mg via ORAL
  Filled 2023-09-12 (×2): qty 3

## 2023-09-12 MED ORDER — MIRTAZAPINE 15 MG PO TABS
15.0000 mg | ORAL_TABLET | Freq: Every day | ORAL | Status: DC
  Administered 2023-09-12: 15 mg via ORAL
  Filled 2023-09-12: qty 1

## 2023-09-12 MED ORDER — MAGNESIUM SULFATE 2 GM/50ML IV SOLN
2.0000 g | Freq: Once | INTRAVENOUS | Status: AC
  Administered 2023-09-12: 2 g via INTRAVENOUS
  Filled 2023-09-12: qty 50

## 2023-09-12 MED ORDER — ONDANSETRON HCL 4 MG/2ML IJ SOLN: 4.0000 mg | Freq: Four times a day (QID) | INTRAMUSCULAR | Status: DC | PRN

## 2023-09-12 MED ORDER — SODIUM CHLORIDE 0.9 % IV BOLUS: 1000.0000 mL | INTRAVENOUS | Status: DC | PRN

## 2023-09-12 MED ORDER — VITAMIN D3 25 MCG (1000 UNIT) PO TABS
2000.0000 [IU] | ORAL_TABLET | Freq: Two times a day (BID) | ORAL | Status: DC
Start: 2023-09-12 — End: 2023-09-13
  Administered 2023-09-12 – 2023-09-13 (×2): 2000 [IU] via ORAL
  Filled 2023-09-12 (×2): qty 2

## 2023-09-12 MED ORDER — MAGNESIUM CHLORIDE 64 MG PO TBEC
2.0000 | DELAYED_RELEASE_TABLET | Freq: Two times a day (BID) | ORAL | Status: DC
Start: 2023-09-12 — End: 2023-09-13
  Administered 2023-09-12 – 2023-09-13 (×2): 128 mg via ORAL
  Filled 2023-09-12 (×2): qty 2

## 2023-09-12 MED ORDER — LOPERAMIDE HCL 2 MG PO CAPS
2.0000 mg | ORAL_CAPSULE | Freq: Two times a day (BID) | ORAL | Status: DC
  Administered 2023-09-12 – 2023-09-13 (×2): 2 mg via ORAL
  Filled 2023-09-12 (×2): qty 1

## 2023-09-12 MED ORDER — SODIUM CHLORIDE 0.9 % IV SOLN
INTRAVENOUS | Status: AC
Start: 2023-09-12 — End: 2023-09-13

## 2023-09-12 MED ORDER — ONDANSETRON HCL 4 MG PO TABS: 4.0000 mg | ORAL_TABLET | Freq: Four times a day (QID) | ORAL | Status: DC | PRN

## 2023-09-12 MED ORDER — GABAPENTIN 300 MG PO CAPS
1200.0000 mg | ORAL_CAPSULE | Freq: Three times a day (TID) | ORAL | Status: DC
  Administered 2023-09-12 – 2023-09-13 (×3): 1200 mg via ORAL
  Filled 2023-09-12 (×3): qty 4

## 2023-09-12 MED ORDER — HYDROCERIN EX CREA
1.0000 | TOPICAL_CREAM | Freq: Two times a day (BID) | CUTANEOUS | Status: DC
  Administered 2023-09-13: 1 via TOPICAL
  Filled 2023-09-12: qty 113

## 2023-09-12 MED ORDER — PEDIALYTE PO SOLN: 240.0000 mL | Freq: Every day | ORAL | Status: DC

## 2023-09-12 MED ORDER — POTASSIUM CHLORIDE 10 MEQ/100ML IV SOLN: 10.0000 meq | INTRAVENOUS | Status: DC

## 2023-09-12 NOTE — ED Notes (Signed)
 Patient transported to CT
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
## 2023-09-12 NOTE — Progress Notes (Signed)
   09/12/23 0938  TOC Brief Assessment  Insurance and Status Reviewed ([place] [employer])  Patient has primary care physician Yes  Home environment has been reviewed Prior to this admission living at [facility]  Prior level of function: Independent  Prior/Current Home Services No current home services  Social Drivers of Health Review SDOH reviewed no interventions necessary  Readmission risk has been reviewed Yes (N/A)  Transition of care needs no transition of care needs at this time
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
## 2023-09-13 ENCOUNTER — Other Ambulatory Visit: Payer: Self-pay

## 2023-09-13 DIAGNOSIS — N179 Acute kidney failure, unspecified: Secondary | ICD-10-CM | POA: Diagnosis not present

## 2023-09-13 LAB — CBC WITH DIFFERENTIAL/PLATELET
Abs Immature Granulocytes: 0.02 10*3/uL (ref 0.00–0.07)
Basophils Absolute: 0 10*3/uL (ref 0.0–0.1)
Basophils Relative: 0 %
Eosinophils Absolute: 0.1 10*3/uL (ref 0.0–0.5)
Eosinophils Relative: 3 %
HCT: 27.6 % — ABNORMAL LOW (ref 39.0–52.0)
Hemoglobin: 9.1 g/dL — ABNORMAL LOW (ref 13.0–17.0)
Immature Granulocytes: 1 %
Lymphocytes Relative: 25 %
Lymphs Abs: 1 10*3/uL (ref 0.7–4.0)
MCH: 29.3 pg (ref 26.0–34.0)
MCHC: 33 g/dL (ref 30.0–36.0)
MCV: 88.7 fL (ref 80.0–100.0)
Monocytes Absolute: 0.5 10*3/uL (ref 0.1–1.0)
Monocytes Relative: 13 %
Neutro Abs: 2.3 10*3/uL (ref 1.7–7.7)
Neutrophils Relative %: 58 %
Platelets: 201 10*3/uL (ref 150–400)
RBC: 3.11 MIL/uL — ABNORMAL LOW (ref 4.22–5.81)
RDW: 15.5 % (ref 11.5–15.5)
WBC: 3.9 10*3/uL — ABNORMAL LOW (ref 4.0–10.5)
nRBC: 0 % (ref 0.0–0.2)

## 2023-09-13 LAB — COMPREHENSIVE METABOLIC PANEL WITH GFR
ALT: 39 U/L (ref 0–44)
AST: 28 U/L (ref 15–41)
Albumin: 3 g/dL — ABNORMAL LOW (ref 3.5–5.0)
Alkaline Phosphatase: 119 U/L (ref 38–126)
Anion gap: 7 (ref 5–15)
BUN: 11 mg/dL (ref 6–20)
CO2: 25 mmol/L (ref 22–32)
Calcium: 9.1 mg/dL (ref 8.9–10.3)
Chloride: 106 mmol/L (ref 98–111)
Creatinine, Ser: 1.32 mg/dL — ABNORMAL HIGH (ref 0.61–1.24)
GFR, Estimated: >60 mL/min (ref >60)
Glucose, Bld: 97 mg/dL (ref 70–99)
Potassium: 3.7 mmol/L (ref 3.5–5.1)
Sodium: 138 mmol/L (ref 135–145)
Total Bilirubin: 1.4 mg/dL — ABNORMAL HIGH (ref 0.0–1.2)
Total Protein: 6.5 g/dL (ref 6.5–8.1)

## 2023-09-13 LAB — MAGNESIUM: Magnesium: 1.9 mg/dL (ref 1.7–2.4)

## 2023-09-13 MED ORDER — FAMOTIDINE 20 MG PO TABS: 20.0000 mg | ORAL_TABLET | Freq: Every day | ORAL | Status: AC

## 2023-09-13 MED ORDER — LOPERAMIDE HCL 2 MG PO CAPS: 2.0000 mg | ORAL_CAPSULE | Freq: Two times a day (BID) | ORAL | 0 refills | Status: AC

## 2023-09-13 MED ORDER — BACLOFEN 10 MG PO TABS
30.0000 mg | ORAL_TABLET | Freq: Three times a day (TID) | ORAL | Status: DC
  Administered 2023-09-13 (×2): 30 mg via ORAL
  Filled 2023-09-13 (×2): qty 3

## 2023-09-13 MED ORDER — PEDIALYTE PO SOLN: 240.0000 mL | Freq: Four times a day (QID) | ORAL | Status: DC
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
## 2023-09-13 NOTE — Progress Notes (Addendum)
 Patient having aggressive behavior towards nursing staff throughout the night. Yelling, cursing, constantly ringing the call light and demanding food and drinks all night. Patient constantly lifting ostomy pouch causing content leakage after the day shift nurse changed the pouch and gave patient supplies to change the pouch more than 5 times causing surrounding skin to become irritated. Ostomy reinforced with soft cloth tape to prevent leakage, patient continues to pick with the dressing causing leakage. He is removing BP cuff then saying it is falling off.

## 2023-09-13 NOTE — Plan of Care (Signed)
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
## 2023-09-14 ENCOUNTER — Telehealth (INDEPENDENT_AMBULATORY_CARE_PROVIDER_SITE_OTHER): Payer: Self-pay

## 2023-09-14 NOTE — Transitions of Care (Post Inpatient/ED Visit) (Unsigned)
   09/14/2023  Name: ANCHOR DWAN MRN: [id number] DOB: 11-28-1990  Today's TOC FU Call Status: Today's TOC FU Call Status:: Unsuccessful Call (1st Attempt) Unsuccessful Call (1st Attempt) Date: 09/14/23  Attempted to reach the patient regarding the most recent Inpatient/ED visit.  Follow Up Plan: Additional outreach attempts will be made to reach the patient to complete the Transitions of Care (Post Inpatient/ED visit) call.   Signature Darrall Ellison, LPN [facility] Nurse Health Advisor Direct Dial [phone]

## 2023-09-20 NOTE — Transitions of Care (Post Inpatient/ED Visit) (Signed)
   09/20/2023  Name: Shane Klein MRN: [id number] DOB: [birth year]  Today's TOC FU Call Status: Today's TOC FU Call Status:: Successful TOC FU Call Completed Unsuccessful Call (1st Attempt) Date: 09/14/23 [facility] FU Call Complete Date: 09/20/23  Attempted to reach the patient regarding the most recent Inpatient/ED visit.  Follow Up Plan: No further outreach attempts will be made at this time. We have been unable to contact the patient. Patient in [facility] Signature Darrall Ellison, LPN [facility] Nurse Health Advisor Direct Dial [phone]

## 2023-09-27 ENCOUNTER — Emergency Department (HOSPITAL_COMMUNITY)

## 2023-09-27 ENCOUNTER — Encounter (HOSPITAL_COMMUNITY): Payer: Self-pay

## 2023-09-27 ENCOUNTER — Inpatient Hospital Stay (HOSPITAL_COMMUNITY)
Admission: EM | Admit: 2023-09-27 | Discharge: 2023-09-29 | DRG: 683 | Attending: Internal Medicine | Admitting: Internal Medicine

## 2023-09-27 ENCOUNTER — Other Ambulatory Visit: Payer: Self-pay

## 2023-09-27 DIAGNOSIS — E440 Moderate protein-calorie malnutrition: Secondary | ICD-10-CM | POA: Diagnosis present

## 2023-09-27 DIAGNOSIS — E861 Hypovolemia: Secondary | ICD-10-CM | POA: Diagnosis present

## 2023-09-27 DIAGNOSIS — K5080 Crohn's disease of both small and large intestine without complications: Secondary | ICD-10-CM | POA: Diagnosis present

## 2023-09-27 DIAGNOSIS — Z91018 Allergy to other foods: Secondary | ICD-10-CM

## 2023-09-27 DIAGNOSIS — Z79899 Other long term (current) drug therapy: Secondary | ICD-10-CM

## 2023-09-27 DIAGNOSIS — Z87891 Personal history of nicotine dependence: Secondary | ICD-10-CM

## 2023-09-27 DIAGNOSIS — N179 Acute kidney failure, unspecified: Secondary | ICD-10-CM | POA: Diagnosis not present

## 2023-09-27 DIAGNOSIS — Z889 Allergy status to unspecified drugs, medicaments and biological substances status: Secondary | ICD-10-CM

## 2023-09-27 DIAGNOSIS — R198 Other specified symptoms and signs involving the digestive system and abdomen: Secondary | ICD-10-CM

## 2023-09-27 DIAGNOSIS — E871 Hypo-osmolality and hyponatremia: Secondary | ICD-10-CM | POA: Diagnosis not present

## 2023-09-27 DIAGNOSIS — K5000 Crohn's disease of small intestine without complications: Secondary | ICD-10-CM | POA: Diagnosis present

## 2023-09-27 DIAGNOSIS — N182 Chronic kidney disease, stage 2 (mild): Secondary | ICD-10-CM | POA: Diagnosis present

## 2023-09-27 DIAGNOSIS — Z932 Ileostomy status: Secondary | ICD-10-CM

## 2023-09-27 DIAGNOSIS — Z833 Family history of diabetes mellitus: Secondary | ICD-10-CM

## 2023-09-27 DIAGNOSIS — Z6824 Body mass index (BMI) 24.0-24.9, adult: Secondary | ICD-10-CM

## 2023-09-27 DIAGNOSIS — Z8249 Family history of ischemic heart disease and other diseases of the circulatory system: Secondary | ICD-10-CM

## 2023-09-27 LAB — CBC
HCT: 31.1 % — ABNORMAL LOW (ref 39.0–52.0)
Hemoglobin: 10.8 g/dL — ABNORMAL LOW (ref 13.0–17.0)
MCH: 29 pg (ref 26.0–34.0)
MCHC: 34.7 g/dL (ref 30.0–36.0)
MCV: 83.4 fL (ref 80.0–100.0)
Platelets: 512 10*3/uL — ABNORMAL HIGH (ref 150–400)
RBC: 3.73 MIL/uL — ABNORMAL LOW (ref 4.22–5.81)
RDW: 14.4 % (ref 11.5–15.5)
WBC: 6 10*3/uL (ref 4.0–10.5)
nRBC: 0 % (ref 0.0–0.2)

## 2023-09-27 LAB — URINALYSIS, ROUTINE W REFLEX MICROSCOPIC
Bilirubin Urine: NEGATIVE
Glucose, UA: NEGATIVE mg/dL
Ketones, ur: NEGATIVE mg/dL
Leukocytes,Ua: NEGATIVE
Nitrite: NEGATIVE
Protein, ur: NEGATIVE mg/dL
Specific Gravity, Urine: 1.004 — ABNORMAL LOW (ref 1.005–1.030)
pH: 5 (ref 5.0–8.0)

## 2023-09-27 LAB — COMPREHENSIVE METABOLIC PANEL WITH GFR
ALT: 40 U/L (ref 0–44)
AST: 54 U/L — ABNORMAL HIGH (ref 15–41)
Albumin: 4.4 g/dL (ref 3.5–5.0)
Alkaline Phosphatase: 107 U/L (ref 38–126)
Anion gap: 14 (ref 5–15)
BUN: 39 mg/dL — ABNORMAL HIGH (ref 6–20)
CO2: 26 mmol/L (ref 22–32)
Calcium: 10.2 mg/dL (ref 8.9–10.3)
Chloride: 84 mmol/L — ABNORMAL LOW (ref 98–111)
Creatinine, Ser: 2.85 mg/dL — ABNORMAL HIGH (ref 0.61–1.24)
GFR, Estimated: 29 mL/min — ABNORMAL LOW (ref >60)
Glucose, Bld: 94 mg/dL (ref 70–99)
Potassium: 4.3 mmol/L (ref 3.5–5.1)
Sodium: 124 mmol/L — ABNORMAL LOW (ref 135–145)
Total Bilirubin: 0.9 mg/dL (ref 0.0–1.2)
Total Protein: 9.3 g/dL — ABNORMAL HIGH (ref 6.5–8.1)

## 2023-09-27 LAB — LIPASE, BLOOD: Lipase: 42 U/L (ref 11–51)

## 2023-09-27 MED ORDER — ENOXAPARIN SODIUM 30 MG/0.3ML IJ SOSY
30.0000 mg | PREFILLED_SYRINGE | INTRAMUSCULAR | Status: DC
  Administered 2023-09-28: 30 mg via SUBCUTANEOUS
  Filled 2023-09-27: qty 0.3

## 2023-09-27 MED ORDER — ONDANSETRON HCL 4 MG/2ML IJ SOLN
4.0000 mg | Freq: Once | INTRAMUSCULAR | Status: AC
  Administered 2023-09-27: 4 mg via INTRAVENOUS
  Filled 2023-09-27: qty 2

## 2023-09-27 MED ORDER — SODIUM CHLORIDE 0.9 % IV SOLN: INTRAVENOUS | Status: DC

## 2023-09-27 MED ORDER — MORPHINE SULFATE (PF) 2 MG/ML IV SOLN
2.0000 mg | Freq: Once | INTRAVENOUS | Status: AC
  Administered 2023-09-27: 2 mg via INTRAVENOUS
  Filled 2023-09-27: qty 1

## 2023-09-27 MED ORDER — LACTATED RINGERS IV BOLUS
1000.0000 mL | Freq: Once | INTRAVENOUS | Status: AC
  Administered 2023-09-27: 1000 mL via INTRAVENOUS
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
## 2023-09-27 NOTE — ED Triage Notes (Signed)
 [facility] for abnormal kidney function labs, none of the paperwork that came with pt has any results on them Pt c.o generalized abd pain and nausea for the part 4 days. Pt has ileostomy bag.
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
## 2023-09-28 ENCOUNTER — Encounter (HOSPITAL_COMMUNITY): Payer: Self-pay | Admitting: Internal Medicine

## 2023-09-28 ENCOUNTER — Other Ambulatory Visit: Payer: Self-pay

## 2023-09-28 DIAGNOSIS — Z932 Ileostomy status: Secondary | ICD-10-CM | POA: Diagnosis not present

## 2023-09-28 DIAGNOSIS — Z6824 Body mass index (BMI) 24.0-24.9, adult: Secondary | ICD-10-CM | POA: Diagnosis not present

## 2023-09-28 DIAGNOSIS — E861 Hypovolemia: Secondary | ICD-10-CM | POA: Diagnosis present

## 2023-09-28 DIAGNOSIS — Z91018 Allergy to other foods: Secondary | ICD-10-CM | POA: Diagnosis not present

## 2023-09-28 DIAGNOSIS — E440 Moderate protein-calorie malnutrition: Secondary | ICD-10-CM | POA: Diagnosis present

## 2023-09-28 DIAGNOSIS — Z833 Family history of diabetes mellitus: Secondary | ICD-10-CM | POA: Diagnosis not present

## 2023-09-28 DIAGNOSIS — K5000 Crohn's disease of small intestine without complications: Secondary | ICD-10-CM | POA: Diagnosis present

## 2023-09-28 DIAGNOSIS — Z889 Allergy status to unspecified drugs, medicaments and biological substances status: Secondary | ICD-10-CM | POA: Diagnosis not present

## 2023-09-28 DIAGNOSIS — Z79899 Other long term (current) drug therapy: Secondary | ICD-10-CM | POA: Diagnosis not present

## 2023-09-28 DIAGNOSIS — Z8249 Family history of ischemic heart disease and other diseases of the circulatory system: Secondary | ICD-10-CM | POA: Diagnosis not present

## 2023-09-28 DIAGNOSIS — N182 Chronic kidney disease, stage 2 (mild): Secondary | ICD-10-CM | POA: Diagnosis present

## 2023-09-28 DIAGNOSIS — Z87891 Personal history of nicotine dependence: Secondary | ICD-10-CM | POA: Diagnosis not present

## 2023-09-28 DIAGNOSIS — E871 Hypo-osmolality and hyponatremia: Secondary | ICD-10-CM | POA: Diagnosis present

## 2023-09-28 DIAGNOSIS — N179 Acute kidney failure, unspecified: Secondary | ICD-10-CM | POA: Diagnosis present

## 2023-09-28 LAB — CBC
HCT: 29.4 % — ABNORMAL LOW (ref 39.0–52.0)
Hemoglobin: 9.9 g/dL — ABNORMAL LOW (ref 13.0–17.0)
MCH: 28.2 pg (ref 26.0–34.0)
MCHC: 33.7 g/dL (ref 30.0–36.0)
MCV: 83.8 fL (ref 80.0–100.0)
Platelets: 427 10*3/uL — ABNORMAL HIGH (ref 150–400)
RBC: 3.51 MIL/uL — ABNORMAL LOW (ref 4.22–5.81)
RDW: 14.6 % (ref 11.5–15.5)
WBC: 4.4 10*3/uL (ref 4.0–10.5)
nRBC: 0 % (ref 0.0–0.2)

## 2023-09-28 LAB — BASIC METABOLIC PANEL WITH GFR
Anion gap: 12 (ref 5–15)
Anion gap: 9 (ref 5–15)
Anion gap: 8 (ref 5–15)
BUN: 38 mg/dL — ABNORMAL HIGH (ref 6–20)
BUN: 37 mg/dL — ABNORMAL HIGH (ref 6–20)
BUN: 32 mg/dL — ABNORMAL HIGH (ref 6–20)
CO2: 27 mmol/L (ref 22–32)
CO2: 29 mmol/L (ref 22–32)
CO2: 29 mmol/L (ref 22–32)
Calcium: 9.6 mg/dL (ref 8.9–10.3)
Calcium: 9.5 mg/dL (ref 8.9–10.3)
Calcium: 9.8 mg/dL (ref 8.9–10.3)
Chloride: 89 mmol/L — ABNORMAL LOW (ref 98–111)
Chloride: 92 mmol/L — ABNORMAL LOW (ref 98–111)
Chloride: 93 mmol/L — ABNORMAL LOW (ref 98–111)
Creatinine, Ser: 2.43 mg/dL — ABNORMAL HIGH (ref 0.61–1.24)
Creatinine, Ser: 2.36 mg/dL — ABNORMAL HIGH (ref 0.61–1.24)
Creatinine, Ser: 2.2 mg/dL — ABNORMAL HIGH (ref 0.61–1.24)
GFR, Estimated: 35 mL/min — ABNORMAL LOW (ref >60)
GFR, Estimated: 37 mL/min — ABNORMAL LOW (ref >60)
GFR, Estimated: 40 mL/min — ABNORMAL LOW (ref >60)
Glucose, Bld: 119 mg/dL — ABNORMAL HIGH (ref 70–99)
Glucose, Bld: 88 mg/dL (ref 70–99)
Glucose, Bld: 98 mg/dL (ref 70–99)
Potassium: 3.2 mmol/L — ABNORMAL LOW (ref 3.5–5.1)
Potassium: 3.5 mmol/L (ref 3.5–5.1)
Potassium: 4.1 mmol/L (ref 3.5–5.1)
Sodium: 128 mmol/L — ABNORMAL LOW (ref 135–145)
Sodium: 130 mmol/L — ABNORMAL LOW (ref 135–145)
Sodium: 130 mmol/L — ABNORMAL LOW (ref 135–145)

## 2023-09-28 LAB — MRSA NEXT GEN BY PCR, NASAL: MRSA by PCR Next Gen: NOT DETECTED

## 2023-09-28 LAB — PHOSPHORUS: Phosphorus: 4 mg/dL (ref 2.5–4.6)

## 2023-09-28 LAB — MAGNESIUM: Magnesium: 2.1 mg/dL (ref 1.7–2.4)

## 2023-09-28 MED ORDER — FAMOTIDINE 20 MG PO TABS
20.0000 mg | ORAL_TABLET | Freq: Every day | ORAL | Status: DC
  Administered 2023-09-28 – 2023-09-29 (×2): 20 mg via ORAL
  Filled 2023-09-28 (×2): qty 1

## 2023-09-28 MED ORDER — VITAMIN B-12 1000 MCG PO TABS
1000.0000 ug | ORAL_TABLET | Freq: Every day | ORAL | Status: DC
  Administered 2023-09-28 – 2023-09-29 (×2): 1000 ug via ORAL
  Filled 2023-09-28 (×2): qty 1

## 2023-09-28 MED ORDER — ORAL ELECTROLYTES PO SOLN: 591.0000 mL | Freq: Three times a day (TID) | ORAL | Status: DC

## 2023-09-28 MED ORDER — GABAPENTIN 400 MG PO CAPS
1200.0000 mg | ORAL_CAPSULE | Freq: Three times a day (TID) | ORAL | Status: DC
  Administered 2023-09-28 – 2023-09-29 (×4): 1200 mg via ORAL
  Filled 2023-09-28 (×4): qty 3

## 2023-09-28 MED ORDER — LACTATED RINGERS IV SOLN: INTRAVENOUS | Status: DC

## 2023-09-28 MED ORDER — ENOXAPARIN SODIUM 40 MG/0.4ML IJ SOSY
40.0000 mg | PREFILLED_SYRINGE | INTRAMUSCULAR | Status: DC
  Administered 2023-09-28: 40 mg via SUBCUTANEOUS
  Filled 2023-09-28: qty 0.4

## 2023-09-28 MED ORDER — ORAL CARE MOUTH RINSE: 15.0000 mL | OROMUCOSAL | Status: DC | PRN

## 2023-09-28 MED ORDER — ADULT MULTIVITAMIN W/MINERALS CH
1.0000 | ORAL_TABLET | Freq: Every day | ORAL | Status: DC
  Administered 2023-09-28 – 2023-09-29 (×2): 1 via ORAL
  Filled 2023-09-28 (×2): qty 1

## 2023-09-28 MED ORDER — SODIUM CHLORIDE 0.9 % IV SOLN: INTRAVENOUS | Status: DC
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
## 2023-09-28 NOTE — Plan of Care (Signed)
   Problem: Education: Goal: Knowledge of General Education information will improve Description: Including pain rating scale, medication(s)/side effects and non-pharmacologic comfort measures Outcome: Completed/Met
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
## 2023-09-28 NOTE — Plan of Care (Signed)
   Problem: Education: Goal: Knowledge of General Education information will improve Description Including pain rating scale, medication(s)/side effects and non-pharmacologic comfort measures Outcome: Progressing

## 2023-09-28 NOTE — Progress Notes (Signed)
 Nutrition Education Note  RD consulted for nutrition education regarding ileostomy and dehydration, which lead to this admission.   RD provided "Ileostomy Nutrition Therapy" handout from the Academy of Nutrition and Dietetics. Reviewed patient's dietary recall. Provided examples on ways to increase sodium/fluid intake in diet. Discouraged intake of processed foods. Encouraged Oral Rehydration Solutions (ORS). Examples below provided in written form, however recommended pre-made formulas like Gatorlyte or Liquid IV to promote ease of administration (patient currently incarcerated) and palatability. Recommend >2L per day, based off estimated needs and all of that should be ORS.   Examples of ORS, per Academy of Nutrition and Dietetics: 2 cups Gatorade + 2 cups water  +  teaspoon salt  3 cups water  + 1 cup orange juice +  teaspoon salt +  teaspoon baking soda o  cup grape juice or cranberry juice + 3 cups water  +  teaspoon salt o 1 cup apple juice + 3 cups water  +  teaspoon salt  4 cups (1 liter) water  +  teaspoon table salt + 6 level teaspoons sugar (World Health Organization's ORS recipe)    RD discussed why it is important for patient to adhere to diet recommendations, and emphasized the role of fluids as it relates to preventing re-admission. Pt concerned as he does not have ability to request additional food items at facility. Officer at bedside stating it should not be an issue, if ordered at discharge. Spoke with attending via secure chat and provided recommendations discussed above. Patient may need adjustment to this regimen based on ileostomy output and lab trends. Recommend follow up with PCP or RD at prison, if able. Of note, he is not with excessive ileostomy output recently.   Expect adequate compliance.  Body mass index is 24.41 kg/m. Pt meets criteria for within normative standards for age based on current BMI.  Labs and medications reviewed. No further nutrition  interventions warranted at this time. RD contact information provided. If additional nutrition issues arise, please re-consult RD.   Con Decant MS, RD, LDN Registered Dietitian Clinical Nutrition RD Inpatient Contact Info in Amion

## 2023-09-28 NOTE — Progress Notes (Signed)
 New Admission Note:  Arrival Method: Stretcher Mental Orientation: Alert and oriented x 4 Telemetry: Box 7 NSR Assessment: Completed Skin: warm and dry.  IV:  NSl Pain:denies Tubes: Ileostomy  Safety Measures: Safety Fall Prevention Plan initiated.  Admission: Completed 5 M  Orientation: Patient has been orientated to the room, unit and the staff. Welcome booklet given.  Family: N/A  Orders have been reviewed and implemented. Will continue to monitor the patient. Call light has been placed within reach and bed alarm has been activated.   Woodward Head BSN, RN  Phone Number: [phone]
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
## 2023-09-28 NOTE — Plan of Care (Signed)

## 2023-09-29 DIAGNOSIS — N179 Acute kidney failure, unspecified: Secondary | ICD-10-CM | POA: Diagnosis not present

## 2023-09-29 LAB — BASIC METABOLIC PANEL WITH GFR
Anion gap: 8 (ref 5–15)
BUN: 32 mg/dL — ABNORMAL HIGH (ref 6–20)
CO2: 28 mmol/L (ref 22–32)
Calcium: 9.5 mg/dL (ref 8.9–10.3)
Chloride: 96 mmol/L — ABNORMAL LOW (ref 98–111)
Creatinine, Ser: 2.05 mg/dL — ABNORMAL HIGH (ref 0.61–1.24)
GFR, Estimated: 43 mL/min — ABNORMAL LOW (ref >60)
Glucose, Bld: 92 mg/dL (ref 70–99)
Potassium: 4 mmol/L (ref 3.5–5.1)
Sodium: 132 mmol/L — ABNORMAL LOW (ref 135–145)

## 2023-09-29 LAB — CBC WITH DIFFERENTIAL/PLATELET
Abs Immature Granulocytes: 0.02 10*3/uL (ref 0.00–0.07)
Basophils Absolute: 0 10*3/uL (ref 0.0–0.1)
Basophils Relative: 0 %
Eosinophils Absolute: 0.2 10*3/uL (ref 0.0–0.5)
Eosinophils Relative: 3 %
HCT: 27.9 % — ABNORMAL LOW (ref 39.0–52.0)
Hemoglobin: 9.4 g/dL — ABNORMAL LOW (ref 13.0–17.0)
Immature Granulocytes: 0 %
Lymphocytes Relative: 26 %
Lymphs Abs: 1.5 10*3/uL (ref 0.7–4.0)
MCH: 28.8 pg (ref 26.0–34.0)
MCHC: 33.7 g/dL (ref 30.0–36.0)
MCV: 85.6 fL (ref 80.0–100.0)
Monocytes Absolute: 0.8 10*3/uL (ref 0.1–1.0)
Monocytes Relative: 14 %
Neutro Abs: 3.1 10*3/uL (ref 1.7–7.7)
Neutrophils Relative %: 57 %
Platelets: 370 10*3/uL (ref 150–400)
RBC: 3.26 MIL/uL — ABNORMAL LOW (ref 4.22–5.81)
RDW: 14.8 % (ref 11.5–15.5)
WBC: 5.6 10*3/uL (ref 4.0–10.5)
nRBC: 0 % (ref 0.0–0.2)

## 2023-09-29 MED ORDER — ORAL ELECTROLYTES PO SOLN: 591.0000 mL | Freq: Four times a day (QID) | ORAL | Status: AC
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
## 2023-09-29 NOTE — Plan of Care (Signed)
  Problem: Health Behavior/Discharge Planning: Goal: Ability to manage health-related needs will improve Outcome: Progressing   Problem: Clinical Measurements: Goal: Ability to maintain clinical measurements within normal limits will improve Outcome: Progressing Goal: Will remain free from infection Outcome: Progressing Goal: Diagnostic test results will improve Outcome: Progressing Goal: Respiratory complications will improve Outcome: Progressing Goal: Cardiovascular complication will be avoided Outcome: Progressing   Problem: Activity: Goal: Risk for activity intolerance will decrease Outcome: Progressing   Problem: Nutrition: Goal: Adequate nutrition will be maintained Outcome: Progressing   Problem: Coping: Goal: Level of anxiety will decrease Outcome: Progressing   Problem: Elimination: Goal: Will not experience complications related to bowel motility Outcome: Progressing Goal: Will not experience complications related to urinary retention Outcome: Progressing   Problem: Pain Managment: Goal: General experience of comfort will improve and/or be controlled Outcome: Progressing   Problem: Safety: Goal: Ability to remain free from injury will improve Outcome: Progressing   Problem: Skin Integrity: Goal: Risk for impaired skin integrity will decrease Outcome: Progressing  Patient tolerating diet no pain noted illestomy output WNL

## 2023-09-29 NOTE — TOC Transition Note (Signed)
 Transition of Care [facility]) - Discharge Note   Patient Details  Name: Shane Klein MRN: [id number] Date of Birth: 15-Jul-1990  [facility] CM/SW Contact:  Tom-Johnson, Angelique Ken, RN Phone Number: 09/29/2023, 12:46 PM   Clinical Narrative:     Patient is scheduled for discharge today.  Readmission Risk Assessment done. Hospital f/u and discharge instructions on AVS. No TOC needs or recommendations noted. Public relations account executive at bedside and will transport at discharge.  No further TOC needs noted.       Final next level of care: [facility] Barriers to Discharge: Barriers Resolved   Patient Goals and CMS Choice Patient states their goals for this hospitalization and ongoing recovery are:: To [facility].gov Compare Post Acute Care list provided to:: Patient Choice offered to / list presented to : NA      Discharge Placement                Patient to be transferred to facility by: [facility] Additional resources added to the After Visit Summary for                  DME Arranged: N/A DME Agency: NA       HH Arranged: NA HH Agency: NA        Social Drivers of Health (SDOH) Interventions SDOH Screenings   Food Insecurity: No Food Insecurity (09/12/2023)  Housing: Low Risk  (09/12/2023)  Transportation Needs: No Transportation Needs (09/12/2023)  Utilities: Not At Risk (09/12/2023)  Depression (PHQ2-9): Low Risk  (04/29/2023)  Social Connections: Unknown (02/23/2023)   Received from [facility]  Tobacco Use: Medium Risk (09/27/2023)     Readmission Risk Interventions    09/29/2023   12:39 PM 09/04/2023    9:41 AM 08/10/2023   12:07 PM  Readmission Risk Prevention Plan  Transportation Screening Complete Complete Complete  HRI or Home Care Consult   Complete  Social Work Consult for Recovery Care Planning/Counseling   Complete  Palliative Care Screening    Not Applicable  Medication Review Oceanographer) Referral to Pharmacy Complete Complete  PCP or Specialist appointment within 3-5 days of discharge Complete Complete   HRI or Home Care Consult Complete Complete   SW Recovery Care/Counseling Consult Complete Complete   Palliative Care Screening Not Applicable Not Applicable   Skilled Nursing Facility Not Applicable Not Applicable

## 2023-10-07 ENCOUNTER — Encounter (HOSPITAL_COMMUNITY): Payer: Self-pay

## 2023-10-07 ENCOUNTER — Emergency Department (HOSPITAL_COMMUNITY)

## 2023-10-07 ENCOUNTER — Other Ambulatory Visit: Payer: Self-pay

## 2023-10-07 ENCOUNTER — Inpatient Hospital Stay (HOSPITAL_COMMUNITY)
Admission: EM | Admit: 2023-10-07 | Discharge: 2023-10-10 | DRG: 092 | Attending: Internal Medicine | Admitting: Internal Medicine

## 2023-10-07 DIAGNOSIS — E861 Hypovolemia: Secondary | ICD-10-CM | POA: Diagnosis present

## 2023-10-07 DIAGNOSIS — Z79899 Other long term (current) drug therapy: Secondary | ICD-10-CM

## 2023-10-07 DIAGNOSIS — T428X5A Adverse effect of antiparkinsonism drugs and other central muscle-tone depressants, initial encounter: Secondary | ICD-10-CM | POA: Diagnosis present

## 2023-10-07 DIAGNOSIS — N319 Neuromuscular dysfunction of bladder, unspecified: Secondary | ICD-10-CM | POA: Diagnosis present

## 2023-10-07 DIAGNOSIS — Z833 Family history of diabetes mellitus: Secondary | ICD-10-CM

## 2023-10-07 DIAGNOSIS — N1832 Chronic kidney disease, stage 3b: Secondary | ICD-10-CM | POA: Diagnosis present

## 2023-10-07 DIAGNOSIS — K5080 Crohn's disease of both small and large intestine without complications: Secondary | ICD-10-CM | POA: Diagnosis present

## 2023-10-07 DIAGNOSIS — R404 Transient alteration of awareness: Secondary | ICD-10-CM | POA: Diagnosis not present

## 2023-10-07 DIAGNOSIS — G928 Other toxic encephalopathy: Secondary | ICD-10-CM | POA: Diagnosis not present

## 2023-10-07 DIAGNOSIS — E871 Hypo-osmolality and hyponatremia: Secondary | ICD-10-CM | POA: Diagnosis present

## 2023-10-07 DIAGNOSIS — N179 Acute kidney failure, unspecified: Secondary | ICD-10-CM | POA: Diagnosis present

## 2023-10-07 DIAGNOSIS — R198 Other specified symptoms and signs involving the digestive system and abdomen: Secondary | ICD-10-CM

## 2023-10-07 DIAGNOSIS — K5000 Crohn's disease of small intestine without complications: Secondary | ICD-10-CM | POA: Diagnosis present

## 2023-10-07 DIAGNOSIS — Z8249 Family history of ischemic heart disease and other diseases of the circulatory system: Secondary | ICD-10-CM

## 2023-10-07 DIAGNOSIS — K50919 Crohn's disease, unspecified, with unspecified complications: Secondary | ICD-10-CM | POA: Diagnosis present

## 2023-10-07 DIAGNOSIS — M62838 Other muscle spasm: Secondary | ICD-10-CM | POA: Diagnosis present

## 2023-10-07 DIAGNOSIS — F419 Anxiety disorder, unspecified: Secondary | ICD-10-CM | POA: Diagnosis present

## 2023-10-07 DIAGNOSIS — T426X5A Adverse effect of other antiepileptic and sedative-hypnotic drugs, initial encounter: Secondary | ICD-10-CM | POA: Diagnosis present

## 2023-10-07 DIAGNOSIS — Z87891 Personal history of nicotine dependence: Secondary | ICD-10-CM

## 2023-10-07 DIAGNOSIS — W19XXXA Unspecified fall, initial encounter: Secondary | ICD-10-CM

## 2023-10-07 DIAGNOSIS — F32A Depression, unspecified: Secondary | ICD-10-CM | POA: Diagnosis present

## 2023-10-07 DIAGNOSIS — Z91013 Allergy to seafood: Secondary | ICD-10-CM

## 2023-10-07 DIAGNOSIS — K219 Gastro-esophageal reflux disease without esophagitis: Secondary | ICD-10-CM | POA: Diagnosis present

## 2023-10-07 DIAGNOSIS — Z932 Ileostomy status: Secondary | ICD-10-CM

## 2023-10-07 DIAGNOSIS — W010XXA Fall on same level from slipping, tripping and stumbling without subsequent striking against object, initial encounter: Secondary | ICD-10-CM | POA: Diagnosis present

## 2023-10-07 DIAGNOSIS — G9340 Encephalopathy, unspecified: Secondary | ICD-10-CM | POA: Diagnosis present

## 2023-10-07 DIAGNOSIS — E860 Dehydration: Secondary | ICD-10-CM | POA: Diagnosis present

## 2023-10-07 LAB — CBC WITH DIFFERENTIAL/PLATELET
Abs Immature Granulocytes: 0.02 10*3/uL (ref 0.00–0.07)
Basophils Absolute: 0 10*3/uL (ref 0.0–0.1)
Basophils Relative: 0 %
Eosinophils Absolute: 0.1 10*3/uL (ref 0.0–0.5)
Eosinophils Relative: 1 %
HCT: 30.3 % — ABNORMAL LOW (ref 39.0–52.0)
Hemoglobin: 10.4 g/dL — ABNORMAL LOW (ref 13.0–17.0)
Immature Granulocytes: 0 %
Lymphocytes Relative: 14 %
Lymphs Abs: 1.3 10*3/uL (ref 0.7–4.0)
MCH: 29.1 pg (ref 26.0–34.0)
MCHC: 34.3 g/dL (ref 30.0–36.0)
MCV: 84.9 fL (ref 80.0–100.0)
Monocytes Absolute: 1.1 10*3/uL — ABNORMAL HIGH (ref 0.1–1.0)
Monocytes Relative: 12 %
Neutro Abs: 6.5 10*3/uL (ref 1.7–7.7)
Neutrophils Relative %: 73 %
Platelets: 344 10*3/uL (ref 150–400)
RBC: 3.57 MIL/uL — ABNORMAL LOW (ref 4.22–5.81)
RDW: 14.3 % (ref 11.5–15.5)
WBC: 9.1 10*3/uL (ref 4.0–10.5)
nRBC: 0 % (ref 0.0–0.2)

## 2023-10-07 MED ORDER — NALOXONE HCL 2 MG/2ML IJ SOSY: 1.5000 mg | PREFILLED_SYRINGE | Freq: Once | INTRAMUSCULAR | Status: AC

## 2023-10-07 MED ORDER — NALOXONE HCL 2 MG/2ML IJ SOSY
PREFILLED_SYRINGE | INTRAMUSCULAR | Status: AC
  Filled 2023-10-07: qty 2
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
## 2023-10-07 NOTE — ED Notes (Signed)
 Narcan  0.5mg  admin ivp patient slightly responsive

## 2023-10-07 NOTE — ED Notes (Signed)
 Narcan  0.5mg  admin IVP patient is awake answering yes or no questions. Patient moving around.

## 2023-10-07 NOTE — ED Notes (Signed)
 Narcan  0.5mg  admin IVP poor results
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
## 2023-10-08 ENCOUNTER — Encounter (HOSPITAL_COMMUNITY): Payer: Self-pay | Admitting: Family Medicine

## 2023-10-08 DIAGNOSIS — N179 Acute kidney failure, unspecified: Secondary | ICD-10-CM | POA: Diagnosis not present

## 2023-10-08 DIAGNOSIS — E871 Hypo-osmolality and hyponatremia: Secondary | ICD-10-CM | POA: Diagnosis not present

## 2023-10-08 DIAGNOSIS — G9340 Encephalopathy, unspecified: Secondary | ICD-10-CM

## 2023-10-08 DIAGNOSIS — K50919 Crohn's disease, unspecified, with unspecified complications: Secondary | ICD-10-CM | POA: Diagnosis not present

## 2023-10-08 LAB — RAPID URINE DRUG SCREEN, HOSP PERFORMED
Amphetamines: NOT DETECTED
Barbiturates: NOT DETECTED
Benzodiazepines: NOT DETECTED
Cocaine: NOT DETECTED
Opiates: NOT DETECTED
Tetrahydrocannabinol: NOT DETECTED

## 2023-10-08 LAB — I-STAT ARTERIAL BLOOD GAS, ED
Acid-Base Excess: 7 mmol/L — ABNORMAL HIGH (ref 0.0–2.0)
Bicarbonate: 33.5 mmol/L — ABNORMAL HIGH (ref 20.0–28.0)
Calcium, Ion: 1.22 mmol/L (ref 1.15–1.40)
HCT: 32 % — ABNORMAL LOW (ref 39.0–52.0)
Hemoglobin: 10.9 g/dL — ABNORMAL LOW (ref 13.0–17.0)
O2 Saturation: 97 %
Patient temperature: 36.6
Potassium: 4.2 mmol/L (ref 3.5–5.1)
Sodium: 129 mmol/L — ABNORMAL LOW (ref 135–145)
TCO2: 35 mmol/L — ABNORMAL HIGH (ref 22–32)
pCO2 arterial: 53 mmHg — ABNORMAL HIGH (ref 32–48)
pH, Arterial: 7.408 (ref 7.35–7.45)
pO2, Arterial: 89 mmHg (ref 83–108)

## 2023-10-08 LAB — COMPREHENSIVE METABOLIC PANEL WITH GFR
ALT: 59 U/L — ABNORMAL HIGH (ref 0–44)
AST: 49 U/L — ABNORMAL HIGH (ref 15–41)
Albumin: 4.3 g/dL (ref 3.5–5.0)
Alkaline Phosphatase: 96 U/L (ref 38–126)
Anion gap: 14 (ref 5–15)
BUN: 48 mg/dL — ABNORMAL HIGH (ref 6–20)
CO2: 32 mmol/L (ref 22–32)
Calcium: 9.9 mg/dL (ref 8.9–10.3)
Chloride: 84 mmol/L — ABNORMAL LOW (ref 98–111)
Creatinine, Ser: 2.98 mg/dL — ABNORMAL HIGH (ref 0.61–1.24)
GFR, Estimated: 28 mL/min — ABNORMAL LOW (ref >60)
Glucose, Bld: 100 mg/dL — ABNORMAL HIGH (ref 70–99)
Potassium: 3.8 mmol/L (ref 3.5–5.1)
Sodium: 130 mmol/L — ABNORMAL LOW (ref 135–145)
Total Bilirubin: 1 mg/dL (ref 0.0–1.2)
Total Protein: 8.5 g/dL — ABNORMAL HIGH (ref 6.5–8.1)

## 2023-10-08 LAB — SODIUM, URINE, RANDOM: Sodium, Ur: <10 mmol/L

## 2023-10-08 LAB — ACETAMINOPHEN LEVEL: Acetaminophen (Tylenol), Serum: <10 ug/mL — ABNORMAL LOW (ref 10–30)

## 2023-10-08 LAB — OSMOLALITY: Osmolality: 290 mosm/kg (ref 275–295)

## 2023-10-08 LAB — CREATININE, URINE, RANDOM: Creatinine, Urine: 132 mg/dL

## 2023-10-08 LAB — AMMONIA: Ammonia: 16 umol/L (ref 9–35)

## 2023-10-08 LAB — CK: Total CK: 645 U/L — ABNORMAL HIGH (ref 49–397)

## 2023-10-08 LAB — CREATININE, SERUM
Creatinine, Ser: 2.77 mg/dL — ABNORMAL HIGH (ref 0.61–1.24)
GFR, Estimated: 30 mL/min — ABNORMAL LOW (ref >60)

## 2023-10-08 LAB — ETHANOL: Alcohol, Ethyl (B): <15 mg/dL (ref <15)

## 2023-10-08 LAB — SALICYLATE LEVEL: Salicylate Lvl: <7 mg/dL — ABNORMAL LOW (ref 7.0–30.0)

## 2023-10-08 LAB — TSH: TSH: 3.191 u[IU]/mL (ref 0.350–4.500)

## 2023-10-08 MED ORDER — GABAPENTIN 400 MG PO CAPS
400.0000 mg | ORAL_CAPSULE | Freq: Three times a day (TID) | ORAL | Status: DC
  Administered 2023-10-09: 400 mg via ORAL
  Filled 2023-10-08 (×2): qty 1

## 2023-10-08 MED ORDER — ONDANSETRON HCL 4 MG/2ML IJ SOLN
4.0000 mg | Freq: Once | INTRAMUSCULAR | Status: AC
  Filled 2023-10-08: qty 2

## 2023-10-08 MED ORDER — HEPARIN SODIUM (PORCINE) 5000 UNIT/ML IJ SOLN
5000.0000 [IU] | Freq: Three times a day (TID) | INTRAMUSCULAR | Status: DC
  Administered 2023-10-09 (×3): 5000 [IU] via SUBCUTANEOUS
  Filled 2023-10-08 (×6): qty 1

## 2023-10-08 MED ORDER — LOPERAMIDE HCL 2 MG PO CAPS
2.0000 mg | ORAL_CAPSULE | Freq: Two times a day (BID) | ORAL | Status: DC
  Administered 2023-10-09: 2 mg via ORAL
  Filled 2023-10-08 (×3): qty 1

## 2023-10-08 MED ORDER — NALOXONE HCL 0.4 MG/ML IJ SOLN
0.4000 mg | Freq: Once | INTRAMUSCULAR | Status: AC
  Filled 2023-10-08: qty 1

## 2023-10-08 MED ORDER — SODIUM CHLORIDE 0.9 % IV SOLN: INTRAVENOUS | Status: AC

## 2023-10-08 MED ORDER — LACTATED RINGERS IV BOLUS: 1000.0000 mL | Freq: Once | INTRAVENOUS | Status: AC

## 2023-10-08 MED ORDER — NALOXONE HCL 4 MG/10ML IJ SOLN
1.0000 mg/h | INTRAVENOUS | Status: DC
  Filled 2023-10-08: qty 10

## 2023-10-08 MED ORDER — NALOXONE HCL 0.4 MG/ML IJ SOLN: 0.4000 mg | INTRAMUSCULAR | Status: DC | PRN

## 2023-10-08 NOTE — ED Notes (Signed)
 Ostomy is leaking from side of bandage, ostomy supply kit ordered twice but has not been delivered to ED. Pt is able to change dressing and bag independently when supplies arrive.

## 2023-10-08 NOTE — ED Notes (Signed)
 Narcan  0.4mg  admin IVP. With no results
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
## 2023-10-08 NOTE — ED Notes (Signed)
 Ostomy area cleaned and bag replaced.

## 2023-10-08 NOTE — ED Notes (Signed)
 Nt called CCMD@2 :30am
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
## 2023-10-08 NOTE — ED Notes (Signed)
Lines changed.

## 2023-10-08 NOTE — ED Notes (Signed)
 Pt transported upstairs by NT on portable monitor, correctional officer at bedside.
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
## 2023-10-08 NOTE — ED Notes (Signed)
 Patient is more awake. Responding answering questions.  Patient removed condom cath. Refusing brief. Ice water  given as requested.

## 2023-10-09 DIAGNOSIS — T426X5A Adverse effect of other antiepileptic and sedative-hypnotic drugs, initial encounter: Secondary | ICD-10-CM | POA: Diagnosis present

## 2023-10-09 DIAGNOSIS — E861 Hypovolemia: Secondary | ICD-10-CM | POA: Diagnosis present

## 2023-10-09 DIAGNOSIS — N1832 Chronic kidney disease, stage 3b: Secondary | ICD-10-CM | POA: Diagnosis present

## 2023-10-09 DIAGNOSIS — R404 Transient alteration of awareness: Secondary | ICD-10-CM | POA: Diagnosis present

## 2023-10-09 DIAGNOSIS — Z79899 Other long term (current) drug therapy: Secondary | ICD-10-CM | POA: Diagnosis not present

## 2023-10-09 DIAGNOSIS — F32A Depression, unspecified: Secondary | ICD-10-CM | POA: Diagnosis present

## 2023-10-09 DIAGNOSIS — N319 Neuromuscular dysfunction of bladder, unspecified: Secondary | ICD-10-CM | POA: Diagnosis present

## 2023-10-09 DIAGNOSIS — M62838 Other muscle spasm: Secondary | ICD-10-CM | POA: Diagnosis present

## 2023-10-09 DIAGNOSIS — E871 Hypo-osmolality and hyponatremia: Secondary | ICD-10-CM | POA: Diagnosis present

## 2023-10-09 DIAGNOSIS — W010XXA Fall on same level from slipping, tripping and stumbling without subsequent striking against object, initial encounter: Secondary | ICD-10-CM | POA: Diagnosis present

## 2023-10-09 DIAGNOSIS — T428X5A Adverse effect of antiparkinsonism drugs and other central muscle-tone depressants, initial encounter: Secondary | ICD-10-CM | POA: Diagnosis present

## 2023-10-09 DIAGNOSIS — N179 Acute kidney failure, unspecified: Secondary | ICD-10-CM | POA: Diagnosis present

## 2023-10-09 DIAGNOSIS — E860 Dehydration: Secondary | ICD-10-CM | POA: Diagnosis present

## 2023-10-09 DIAGNOSIS — K219 Gastro-esophageal reflux disease without esophagitis: Secondary | ICD-10-CM | POA: Diagnosis present

## 2023-10-09 DIAGNOSIS — G9340 Encephalopathy, unspecified: Secondary | ICD-10-CM | POA: Diagnosis not present

## 2023-10-09 DIAGNOSIS — Z833 Family history of diabetes mellitus: Secondary | ICD-10-CM | POA: Diagnosis not present

## 2023-10-09 DIAGNOSIS — Z8249 Family history of ischemic heart disease and other diseases of the circulatory system: Secondary | ICD-10-CM | POA: Diagnosis not present

## 2023-10-09 DIAGNOSIS — Z87891 Personal history of nicotine dependence: Secondary | ICD-10-CM | POA: Diagnosis not present

## 2023-10-09 DIAGNOSIS — Z91013 Allergy to seafood: Secondary | ICD-10-CM | POA: Diagnosis not present

## 2023-10-09 DIAGNOSIS — K5000 Crohn's disease of small intestine without complications: Secondary | ICD-10-CM | POA: Diagnosis present

## 2023-10-09 DIAGNOSIS — G928 Other toxic encephalopathy: Secondary | ICD-10-CM | POA: Diagnosis present

## 2023-10-09 DIAGNOSIS — F419 Anxiety disorder, unspecified: Secondary | ICD-10-CM | POA: Diagnosis present

## 2023-10-09 LAB — BASIC METABOLIC PANEL WITH GFR
Anion gap: 9 (ref 5–15)
BUN: 30 mg/dL — ABNORMAL HIGH (ref 6–20)
CO2: 29 mmol/L (ref 22–32)
Calcium: 9 mg/dL (ref 8.9–10.3)
Chloride: 101 mmol/L (ref 98–111)
Creatinine, Ser: 1.59 mg/dL — ABNORMAL HIGH (ref 0.61–1.24)
GFR, Estimated: 59 mL/min — ABNORMAL LOW (ref >60)
Glucose, Bld: 96 mg/dL (ref 70–99)
Potassium: 3.7 mmol/L (ref 3.5–5.1)
Sodium: 139 mmol/L (ref 135–145)

## 2023-10-09 LAB — MAGNESIUM: Magnesium: 2.1 mg/dL (ref 1.7–2.4)

## 2023-10-09 LAB — PHOSPHORUS: Phosphorus: 5.6 mg/dL — ABNORMAL HIGH (ref 2.5–4.6)

## 2023-10-09 MED ORDER — VITAMIN B-12 1000 MCG PO TABS
1000.0000 ug | ORAL_TABLET | Freq: Every day | ORAL | Status: DC
  Administered 2023-10-09: 1000 ug via ORAL
  Filled 2023-10-09 (×2): qty 1

## 2023-10-09 MED ORDER — POTASSIUM CHLORIDE CRYS ER 20 MEQ PO TBCR
40.0000 meq | EXTENDED_RELEASE_TABLET | Freq: Once | ORAL | Status: AC
  Administered 2023-10-09: 40 meq via ORAL
  Filled 2023-10-09: qty 2

## 2023-10-09 MED ORDER — DIPHENOXYLATE-ATROPINE 2.5-0.025 MG PO TABS
1.0000 | ORAL_TABLET | Freq: Two times a day (BID) | ORAL | Status: DC
  Administered 2023-10-09 (×2): 1 via ORAL
  Filled 2023-10-09 (×3): qty 1

## 2023-10-09 MED ORDER — BACLOFEN 10 MG PO TABS
30.0000 mg | ORAL_TABLET | Freq: Three times a day (TID) | ORAL | Status: DC
  Administered 2023-10-09: 30 mg via ORAL
  Filled 2023-10-09 (×2): qty 3

## 2023-10-09 MED ORDER — VITAMIN D 25 MCG (1000 UNIT) PO TABS
2000.0000 [IU] | ORAL_TABLET | Freq: Every day | ORAL | Status: DC
  Administered 2023-10-09: 2000 [IU] via ORAL
  Filled 2023-10-09 (×2): qty 2

## 2023-10-09 MED ORDER — VITAMIN C 500 MG PO TABS
1000.0000 mg | ORAL_TABLET | Freq: Every day | ORAL | Status: DC
  Administered 2023-10-09: 1000 mg via ORAL
  Filled 2023-10-09 (×2): qty 2

## 2023-10-09 MED ORDER — FERROUS SULFATE 325 (65 FE) MG PO TABS
325.0000 mg | ORAL_TABLET | Freq: Two times a day (BID) | ORAL | Status: DC
  Administered 2023-10-09 (×2): 325 mg via ORAL
  Filled 2023-10-09 (×3): qty 1

## 2023-10-09 MED ORDER — FENTANYL CITRATE PF 50 MCG/ML IJ SOSY
25.0000 ug | PREFILLED_SYRINGE | Freq: Once | INTRAMUSCULAR | Status: AC | PRN
  Administered 2023-10-09: 25 ug via INTRAVENOUS
  Filled 2023-10-09: qty 1

## 2023-10-09 MED ORDER — FAMOTIDINE 20 MG PO TABS
20.0000 mg | ORAL_TABLET | Freq: Every day | ORAL | Status: DC
  Administered 2023-10-09: 20 mg via ORAL
  Filled 2023-10-09 (×2): qty 1

## 2023-10-09 MED ORDER — SODIUM CHLORIDE 0.9 % IV SOLN: INTRAVENOUS | Status: DC

## 2023-10-09 MED ORDER — CALCIUM POLYCARBOPHIL 625 MG PO TABS
1250.0000 mg | ORAL_TABLET | Freq: Three times a day (TID) | ORAL | Status: DC
  Administered 2023-10-09 (×3): 1250 mg via ORAL
  Filled 2023-10-09 (×6): qty 2

## 2023-10-09 MED ORDER — LOPERAMIDE HCL 2 MG PO CAPS
2.0000 mg | ORAL_CAPSULE | Freq: Three times a day (TID) | ORAL | Status: DC
  Administered 2023-10-09 (×2): 2 mg via ORAL
  Filled 2023-10-09 (×3): qty 1

## 2023-10-09 MED ORDER — BACLOFEN 10 MG PO TABS: 10.0000 mg | ORAL_TABLET | Freq: Four times a day (QID) | ORAL | Status: DC

## 2023-10-09 MED ORDER — PAROXETINE HCL 20 MG PO TABS
20.0000 mg | ORAL_TABLET | Freq: Every day | ORAL | Status: DC
  Administered 2023-10-09: 20 mg via ORAL
  Filled 2023-10-09: qty 1

## 2023-10-09 MED ORDER — GABAPENTIN 400 MG PO CAPS
1200.0000 mg | ORAL_CAPSULE | Freq: Three times a day (TID) | ORAL | Status: DC
  Administered 2023-10-09 (×2): 1200 mg via ORAL
  Filled 2023-10-09 (×2): qty 3
  Filled 2023-10-09: qty 12

## 2023-10-09 NOTE — Progress Notes (Signed)
 [facility] night cross cover note:   I was notified by the patient's RN that he is continuing to complain of discomfort in the bilateral hands and bilateral feet, in spite of existing order for gabapentin , with the patient requesting additional pain medication to help address this.  I subsequently placed a one-time order for low-dose of fentanyl .  Specifically, I ordered fentanyl  25 mcg IV x 1 dose prn for pain.     Camelia Cavalier, DO Hospitalist
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
## 2023-10-09 NOTE — Plan of Care (Signed)

## 2023-10-10 DIAGNOSIS — G9340 Encephalopathy, unspecified: Secondary | ICD-10-CM | POA: Diagnosis not present

## 2023-10-10 LAB — COMPREHENSIVE METABOLIC PANEL WITH GFR
ALT: 39 U/L (ref 0–44)
AST: 29 U/L (ref 15–41)
Albumin: 2.7 g/dL — ABNORMAL LOW (ref 3.5–5.0)
Alkaline Phosphatase: 58 U/L (ref 38–126)
Anion gap: 8 (ref 5–15)
BUN: 25 mg/dL — ABNORMAL HIGH (ref 6–20)
CO2: 24 mmol/L (ref 22–32)
Calcium: 8.9 mg/dL (ref 8.9–10.3)
Chloride: 107 mmol/L (ref 98–111)
Creatinine, Ser: 1.4 mg/dL — ABNORMAL HIGH (ref 0.61–1.24)
GFR, Estimated: >60 mL/min (ref >60)
Glucose, Bld: 84 mg/dL (ref 70–99)
Potassium: 3.8 mmol/L (ref 3.5–5.1)
Sodium: 139 mmol/L (ref 135–145)
Total Bilirubin: 0.5 mg/dL (ref 0.0–1.2)
Total Protein: 6 g/dL — ABNORMAL LOW (ref 6.5–8.1)

## 2023-10-10 LAB — MAGNESIUM: Magnesium: 1.4 mg/dL — ABNORMAL LOW (ref 1.7–2.4)

## 2023-10-10 LAB — PHOSPHORUS: Phosphorus: 3.1 mg/dL (ref 2.5–4.6)

## 2023-10-10 MED ORDER — MAGNESIUM OXIDE -MG SUPPLEMENT 400 (240 MG) MG PO TABS
800.0000 mg | ORAL_TABLET | Freq: Two times a day (BID) | ORAL | Status: DC
  Administered 2023-10-10: 800 mg via ORAL
  Filled 2023-10-10: qty 2

## 2023-10-10 MED ORDER — MAGNESIUM SULFATE 4 GM/100ML IV SOLN
4.0000 g | Freq: Once | INTRAVENOUS | Status: AC
  Filled 2023-10-10: qty 100

## 2023-10-10 NOTE — Plan of Care (Signed)
Patient is stable.

## 2023-10-10 NOTE — Discharge Instructions (Signed)
 Please allow patient to drink fluids to meet his high ileostomy output, please offer him Gatorade, sodas and juices on top of water 
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
## 2023-10-10 NOTE — TOC Transition Note (Signed)
 Transition of Care [facility]) - Discharge Note   Patient Details  Name: RALSTON VENUS MRN: [id number] Date of Birth: Mar 31, 1991  [facility] CM/SW Contact:  Eusebio High, RN Phone Number: 10/10/2023, 10:54 AM   Clinical Narrative:     [facility] jail [facility]. No TOC needs. Transportation arranged by jail.           Patient Goals and CMS Choice            Discharge Placement                       Discharge Plan and Services Additional resources added to the After Visit Summary for                                       Social Drivers of Health (SDOH) Interventions SDOH Screenings   Food Insecurity: No Food Insecurity (09/12/2023)  Housing: Low Risk  (09/12/2023)  Transportation Needs: No Transportation Needs (09/12/2023)  Utilities: Not At Risk (09/12/2023)  Depression (PHQ2-9): Low Risk  (04/29/2023)  Social Connections: Unknown (02/23/2023)   Received from [facility]  Tobacco Use: Medium Risk (10/08/2023)     Readmission Risk Interventions    09/29/2023   12:39 PM 09/04/2023    9:41 AM 08/10/2023   12:07 PM  Readmission Risk Prevention Plan  Transportation Screening Complete Complete Complete  HRI or Home Care Consult   Complete  Social Work Consult for Recovery Care Planning/Counseling   Complete  Palliative Care Screening   Not Applicable  Medication Review [employer]) Referral to Pharmacy Complete Complete  PCP or Specialist appointment within 3-5 days of discharge Complete Complete   HRI or Home Care Consult Complete Complete   SW Recovery Care/Counseling Consult Complete Complete   Palliative Care Screening Not Applicable Not Applicable   Skilled Nursing Facility Not Applicable Not Applicable
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
## 2023-10-12 LAB — DRUG SCREEN 10 W/CONF, SERUM
Amphetamines, IA: NEGATIVE ng/mL
Barbiturates, IA: NEGATIVE ug/mL
Benzodiazepines, IA: NEGATIVE ng/mL
Cocaine & Metabolite, IA: NEGATIVE ng/mL
Methadone, IA: NEGATIVE ng/mL
Opiates, IA: NEGATIVE ng/mL
Oxycodones, IA: NEGATIVE ng/mL
Phencyclidine, IA: NEGATIVE ng/mL
Propoxyphene, IA: NEGATIVE ng/mL
THC(Marijuana) Metabolite, IA: NEGATIVE ng/mL
